# Patient Record
Sex: Male | Born: 1965
Health system: Southern US, Community
[De-identification: ages and names within clinical notes are randomized; demographics above are authoritative.]

## PROBLEM LIST (undated history)

## (undated) DIAGNOSIS — G8929 Other chronic pain: Secondary | ICD-10-CM

## (undated) DIAGNOSIS — I509 Heart failure, unspecified: Secondary | ICD-10-CM

## (undated) DIAGNOSIS — M545 Low back pain, unspecified: Secondary | ICD-10-CM

## (undated) DIAGNOSIS — J42 Unspecified chronic bronchitis: Secondary | ICD-10-CM

## (undated) DIAGNOSIS — I10 Essential (primary) hypertension: Secondary | ICD-10-CM

## (undated) DIAGNOSIS — Z720 Tobacco use: Secondary | ICD-10-CM

## (undated) DIAGNOSIS — F410 Panic disorder [episodic paroxysmal anxiety] without agoraphobia: Secondary | ICD-10-CM

## (undated) DIAGNOSIS — J449 Chronic obstructive pulmonary disease, unspecified: Secondary | ICD-10-CM

## (undated) DIAGNOSIS — M4640 Discitis, unspecified, site unspecified: Secondary | ICD-10-CM

## (undated) HISTORY — PX: SHOULDER ARTHROSCOPY W/ ROTATOR CUFF REPAIR: SHX2400

## (undated) HISTORY — PX: INCISION AND DRAINAGE FOOT: SHX1800

## (undated) HISTORY — PX: FRACTURE SURGERY: SHX138

---

## 1998-01-21 ENCOUNTER — Emergency Department (HOSPITAL_COMMUNITY): Admission: EM | Admit: 1998-01-21 | Discharge: 1998-01-21 | Payer: Self-pay | Admitting: Emergency Medicine

## 1998-09-12 ENCOUNTER — Emergency Department (HOSPITAL_COMMUNITY): Admission: EM | Admit: 1998-09-12 | Discharge: 1998-09-12 | Payer: Self-pay | Admitting: *Deleted

## 1998-12-14 ENCOUNTER — Encounter: Payer: Self-pay | Admitting: Emergency Medicine

## 1998-12-14 ENCOUNTER — Emergency Department (HOSPITAL_COMMUNITY): Admission: EM | Admit: 1998-12-14 | Discharge: 1998-12-14 | Payer: Self-pay | Admitting: Emergency Medicine

## 1999-03-17 ENCOUNTER — Encounter: Payer: Self-pay | Admitting: Emergency Medicine

## 1999-03-17 ENCOUNTER — Emergency Department (HOSPITAL_COMMUNITY): Admission: EM | Admit: 1999-03-17 | Discharge: 1999-03-17 | Payer: Self-pay | Admitting: Emergency Medicine

## 1999-03-20 ENCOUNTER — Emergency Department (HOSPITAL_COMMUNITY): Admission: EM | Admit: 1999-03-20 | Discharge: 1999-03-20 | Payer: Self-pay | Admitting: Emergency Medicine

## 1999-03-28 ENCOUNTER — Emergency Department (HOSPITAL_COMMUNITY): Admission: EM | Admit: 1999-03-28 | Discharge: 1999-03-28 | Payer: Self-pay | Admitting: Emergency Medicine

## 1999-04-24 ENCOUNTER — Encounter: Payer: Self-pay | Admitting: Emergency Medicine

## 1999-04-24 ENCOUNTER — Emergency Department (HOSPITAL_COMMUNITY): Admission: EM | Admit: 1999-04-24 | Discharge: 1999-04-24 | Payer: Self-pay | Admitting: Emergency Medicine

## 1999-06-09 ENCOUNTER — Ambulatory Visit (HOSPITAL_BASED_OUTPATIENT_CLINIC_OR_DEPARTMENT_OTHER): Admission: RE | Admit: 1999-06-09 | Discharge: 1999-06-09 | Payer: Self-pay | Admitting: Orthopedic Surgery

## 2009-03-07 ENCOUNTER — Emergency Department (HOSPITAL_COMMUNITY): Admission: EM | Admit: 2009-03-07 | Discharge: 2009-03-07 | Payer: Self-pay | Admitting: Emergency Medicine

## 2009-05-21 HISTORY — PX: ORIF METACARPAL FRACTURE: SUR940

## 2009-07-19 ENCOUNTER — Emergency Department (HOSPITAL_COMMUNITY): Admission: EM | Admit: 2009-07-19 | Discharge: 2009-07-19 | Payer: Self-pay | Admitting: Emergency Medicine

## 2009-07-20 ENCOUNTER — Emergency Department (HOSPITAL_COMMUNITY): Admission: EM | Admit: 2009-07-20 | Discharge: 2009-07-20 | Payer: Self-pay | Admitting: Psychiatry

## 2009-07-22 ENCOUNTER — Ambulatory Visit (HOSPITAL_COMMUNITY): Admission: RE | Admit: 2009-07-22 | Discharge: 2009-07-22 | Payer: Self-pay | Admitting: Plastic Surgery

## 2009-07-24 ENCOUNTER — Inpatient Hospital Stay (HOSPITAL_COMMUNITY): Admission: EM | Admit: 2009-07-24 | Discharge: 2009-07-24 | Payer: Self-pay | Admitting: Emergency Medicine

## 2009-07-26 ENCOUNTER — Emergency Department (HOSPITAL_COMMUNITY): Admission: EM | Admit: 2009-07-26 | Discharge: 2009-07-26 | Payer: Self-pay | Admitting: Emergency Medicine

## 2009-07-28 ENCOUNTER — Emergency Department (HOSPITAL_COMMUNITY): Admission: EM | Admit: 2009-07-28 | Discharge: 2009-07-28 | Payer: Self-pay | Admitting: Emergency Medicine

## 2009-08-03 ENCOUNTER — Emergency Department (HOSPITAL_COMMUNITY): Admission: EM | Admit: 2009-08-03 | Discharge: 2009-08-03 | Payer: Self-pay | Admitting: Emergency Medicine

## 2009-08-09 ENCOUNTER — Ambulatory Visit (HOSPITAL_COMMUNITY): Admission: RE | Admit: 2009-08-09 | Discharge: 2009-08-09 | Payer: Self-pay | Admitting: Plastic Surgery

## 2009-09-22 ENCOUNTER — Ambulatory Visit: Payer: Self-pay | Admitting: Family Medicine

## 2010-05-03 ENCOUNTER — Emergency Department (HOSPITAL_COMMUNITY)
Admission: EM | Admit: 2010-05-03 | Discharge: 2010-05-03 | Payer: Self-pay | Source: Home / Self Care | Admitting: Emergency Medicine

## 2010-07-11 ENCOUNTER — Emergency Department (HOSPITAL_COMMUNITY)
Admission: EM | Admit: 2010-07-11 | Discharge: 2010-07-11 | Disposition: A | Discharge status: Home / Self Care | Payer: Self-pay | Attending: Emergency Medicine | Admitting: Emergency Medicine

## 2010-07-11 DIAGNOSIS — M545 Low back pain, unspecified: Secondary | ICD-10-CM | POA: Insufficient documentation

## 2010-07-11 DIAGNOSIS — IMO0001 Reserved for inherently not codable concepts without codable children: Secondary | ICD-10-CM | POA: Insufficient documentation

## 2010-07-11 DIAGNOSIS — Y929 Unspecified place or not applicable: Secondary | ICD-10-CM | POA: Insufficient documentation

## 2010-07-11 DIAGNOSIS — X500XXA Overexertion from strenuous movement or load, initial encounter: Secondary | ICD-10-CM | POA: Insufficient documentation

## 2010-07-11 DIAGNOSIS — IMO0002 Reserved for concepts with insufficient information to code with codable children: Secondary | ICD-10-CM | POA: Insufficient documentation

## 2010-07-11 DIAGNOSIS — M79609 Pain in unspecified limb: Secondary | ICD-10-CM | POA: Insufficient documentation

## 2010-08-01 LAB — URINALYSIS, ROUTINE W REFLEX MICROSCOPIC
Bilirubin Urine: NEGATIVE
Glucose, UA: NEGATIVE mg/dL
Hgb urine dipstick: NEGATIVE
Specific Gravity, Urine: 1.018 (ref 1.005–1.030)
pH: 7.5 (ref 5.0–8.0)

## 2010-08-14 LAB — URINALYSIS, ROUTINE W REFLEX MICROSCOPIC
Glucose, UA: NEGATIVE mg/dL
Ketones, ur: NEGATIVE mg/dL
Urobilinogen, UA: 0.2 mg/dL (ref 0.0–1.0)

## 2010-08-14 LAB — POCT I-STAT, CHEM 8
Calcium, Ion: 1.08 mmol/L — ABNORMAL LOW (ref 1.12–1.32)
Chloride: 102 mEq/L (ref 96–112)
Creatinine, Ser: 0.8 mg/dL (ref 0.4–1.5)
Glucose, Bld: 126 mg/dL — ABNORMAL HIGH (ref 70–99)
HCT: 47 % (ref 39.0–52.0)
Hemoglobin: 12.9 g/dL — ABNORMAL LOW (ref 13.0–17.0)
Hemoglobin: 16 g/dL (ref 13.0–17.0)
Potassium: 3.7 mEq/L (ref 3.5–5.1)
TCO2: 23 mmol/L (ref 0–100)

## 2010-08-14 LAB — DIFFERENTIAL
Basophils Absolute: 0 10*3/uL (ref 0.0–0.1)
Basophils Relative: 0 % (ref 0–1)
Eosinophils Relative: 3 % (ref 0–5)
Monocytes Absolute: 0.6 10*3/uL (ref 0.1–1.0)
Neutro Abs: 4.5 10*3/uL (ref 1.7–7.7)

## 2010-08-14 LAB — CBC
HCT: 37.6 % — ABNORMAL LOW (ref 39.0–52.0)
Hemoglobin: 13 g/dL (ref 13.0–17.0)
Hemoglobin: 15.4 g/dL (ref 13.0–17.0)
MCHC: 34.4 g/dL (ref 30.0–36.0)
MCHC: 34.7 g/dL (ref 30.0–36.0)
MCV: 105.3 fL — ABNORMAL HIGH (ref 78.0–100.0)
RBC: 3.57 MIL/uL — ABNORMAL LOW (ref 4.22–5.81)
RDW: 14.9 % (ref 11.5–15.5)

## 2010-08-14 LAB — URINE MICROSCOPIC-ADD ON

## 2010-08-24 LAB — POCT CARDIAC MARKERS
CKMB, poc: 2.4 ng/mL (ref 1.0–8.0)
Myoglobin, poc: 97.5 ng/mL (ref 12–200)

## 2010-10-05 ENCOUNTER — Emergency Department (HOSPITAL_COMMUNITY)
Admission: EM | Admit: 2010-10-05 | Discharge: 2010-10-09 | Disposition: A | Discharge status: Other Acute Inpatient Hospital | Payer: Self-pay | Attending: Emergency Medicine | Admitting: Emergency Medicine

## 2010-10-05 DIAGNOSIS — F1910 Other psychoactive substance abuse, uncomplicated: Secondary | ICD-10-CM | POA: Insufficient documentation

## 2010-10-05 DIAGNOSIS — F329 Major depressive disorder, single episode, unspecified: Secondary | ICD-10-CM | POA: Insufficient documentation

## 2010-10-05 DIAGNOSIS — R079 Chest pain, unspecified: Secondary | ICD-10-CM | POA: Insufficient documentation

## 2010-10-05 DIAGNOSIS — F3289 Other specified depressive episodes: Secondary | ICD-10-CM | POA: Insufficient documentation

## 2010-10-05 DIAGNOSIS — R45851 Suicidal ideations: Secondary | ICD-10-CM | POA: Insufficient documentation

## 2010-10-05 LAB — COMPREHENSIVE METABOLIC PANEL WITH GFR
ALT: 124 U/L — ABNORMAL HIGH (ref 0–53)
AST: 66 U/L — ABNORMAL HIGH (ref 0–37)
Albumin: 3.6 g/dL (ref 3.5–5.2)
Alkaline Phosphatase: 75 U/L (ref 39–117)
BUN: 15 mg/dL (ref 6–23)
CO2: 26 meq/L (ref 19–32)
Calcium: 9.5 mg/dL (ref 8.4–10.5)
Chloride: 103 meq/L (ref 96–112)
Creatinine, Ser: 1.05 mg/dL (ref 0.4–1.5)
GFR calc non Af Amer: >60 mL/min
Glucose, Bld: 114 mg/dL — ABNORMAL HIGH (ref 70–99)
Potassium: 3.8 meq/L (ref 3.5–5.1)
Sodium: 137 meq/L (ref 135–145)
Total Bilirubin: 0.5 mg/dL (ref 0.3–1.2)
Total Protein: 7.4 g/dL (ref 6.0–8.3)

## 2010-10-05 LAB — CBC
HCT: 42.8 % (ref 39.0–52.0)
Hemoglobin: 15 g/dL (ref 13.0–17.0)
MCH: 35.2 pg — ABNORMAL HIGH (ref 26.0–34.0)
MCHC: 35 g/dL (ref 30.0–36.0)
MCV: 100.5 fL — ABNORMAL HIGH (ref 78.0–100.0)
Platelets: 204 K/uL (ref 150–400)
RBC: 4.26 MIL/uL (ref 4.22–5.81)
RDW: 13.7 % (ref 11.5–15.5)
WBC: 6.6 K/uL (ref 4.0–10.5)

## 2010-10-05 LAB — POCT CARDIAC MARKERS
CKMB, poc: 1.5 ng/mL (ref 1.0–8.0)
Myoglobin, poc: 51.2 ng/mL (ref 12–200)

## 2010-10-05 LAB — ETHANOL

## 2010-10-05 LAB — SALICYLATE LEVEL: Salicylate Lvl: <2 mg/dL — ABNORMAL LOW (ref 2.8–20.0)

## 2010-10-05 LAB — DIFFERENTIAL
Basophils Relative: 0 % (ref 0–1)
Eosinophils Absolute: 0.2 10*3/uL (ref 0.0–0.7)
Eosinophils Relative: 3 % (ref 0–5)

## 2010-10-05 LAB — RAPID URINE DRUG SCREEN, HOSP PERFORMED
Amphetamines: NOT DETECTED
Barbiturates: NOT DETECTED
Benzodiazepines: POSITIVE — AB
Cocaine: POSITIVE — AB
Opiates: NOT DETECTED
Tetrahydrocannabinol: NOT DETECTED

## 2010-10-05 LAB — ACETAMINOPHEN LEVEL: Acetaminophen (Tylenol), Serum: <15 ug/mL (ref 10–30)

## 2010-10-07 DIAGNOSIS — F329 Major depressive disorder, single episode, unspecified: Secondary | ICD-10-CM

## 2010-10-07 DIAGNOSIS — F1910 Other psychoactive substance abuse, uncomplicated: Secondary | ICD-10-CM

## 2010-10-08 DIAGNOSIS — F329 Major depressive disorder, single episode, unspecified: Secondary | ICD-10-CM

## 2010-10-09 DIAGNOSIS — F4310 Post-traumatic stress disorder, unspecified: Secondary | ICD-10-CM

## 2010-10-16 ENCOUNTER — Emergency Department (HOSPITAL_COMMUNITY): Payer: Self-pay

## 2010-10-16 ENCOUNTER — Emergency Department (HOSPITAL_COMMUNITY)
Admission: EM | Admit: 2010-10-16 | Discharge: 2010-10-16 | Disposition: A | Discharge status: Home / Self Care | Payer: Self-pay | Attending: Emergency Medicine | Admitting: Emergency Medicine

## 2010-10-16 DIAGNOSIS — IMO0002 Reserved for concepts with insufficient information to code with codable children: Secondary | ICD-10-CM | POA: Insufficient documentation

## 2010-10-16 DIAGNOSIS — Y929 Unspecified place or not applicable: Secondary | ICD-10-CM | POA: Insufficient documentation

## 2010-10-16 DIAGNOSIS — T07XXXA Unspecified multiple injuries, initial encounter: Secondary | ICD-10-CM | POA: Insufficient documentation

## 2010-10-16 DIAGNOSIS — R05 Cough: Secondary | ICD-10-CM | POA: Insufficient documentation

## 2010-10-16 DIAGNOSIS — R51 Headache: Secondary | ICD-10-CM | POA: Insufficient documentation

## 2010-10-16 DIAGNOSIS — Z79899 Other long term (current) drug therapy: Secondary | ICD-10-CM | POA: Insufficient documentation

## 2010-10-16 DIAGNOSIS — R059 Cough, unspecified: Secondary | ICD-10-CM | POA: Insufficient documentation

## 2010-10-16 DIAGNOSIS — S2249XA Multiple fractures of ribs, unspecified side, initial encounter for closed fracture: Secondary | ICD-10-CM | POA: Insufficient documentation

## 2010-10-16 DIAGNOSIS — M25529 Pain in unspecified elbow: Secondary | ICD-10-CM | POA: Insufficient documentation

## 2010-10-16 DIAGNOSIS — S060X9A Concussion with loss of consciousness of unspecified duration, initial encounter: Secondary | ICD-10-CM | POA: Insufficient documentation

## 2010-10-16 DIAGNOSIS — M25569 Pain in unspecified knee: Secondary | ICD-10-CM | POA: Insufficient documentation

## 2010-10-16 DIAGNOSIS — R079 Chest pain, unspecified: Secondary | ICD-10-CM | POA: Insufficient documentation

## 2010-10-16 LAB — URINE MICROSCOPIC-ADD ON

## 2010-10-16 LAB — SAMPLE TO BLOOD BANK

## 2010-10-16 LAB — POCT I-STAT, CHEM 8
Chloride: 101 mEq/L (ref 96–112)
Glucose, Bld: 113 mg/dL — ABNORMAL HIGH (ref 70–99)
HCT: 46 % (ref 39.0–52.0)
Hemoglobin: 15.6 g/dL (ref 13.0–17.0)
Potassium: 3.8 mEq/L (ref 3.5–5.1)
Sodium: 137 mEq/L (ref 135–145)

## 2010-10-16 LAB — URINALYSIS, ROUTINE W REFLEX MICROSCOPIC
Glucose, UA: NEGATIVE mg/dL
Ketones, ur: NEGATIVE mg/dL
Leukocytes, UA: NEGATIVE
Nitrite: NEGATIVE
Protein, ur: NEGATIVE mg/dL
Urobilinogen, UA: 1 mg/dL (ref 0.0–1.0)

## 2010-10-16 MED ORDER — IOHEXOL 300 MG/ML  SOLN
100.0000 mL | Freq: Once | INTRAMUSCULAR | Status: AC | PRN
  Administered 2010-10-16: 100 mL via INTRAVENOUS

## 2010-10-21 ENCOUNTER — Emergency Department (HOSPITAL_COMMUNITY): Payer: Self-pay

## 2010-10-21 ENCOUNTER — Emergency Department (HOSPITAL_COMMUNITY)
Admission: EM | Admit: 2010-10-21 | Discharge: 2010-10-22 | Disposition: A | Discharge status: Home / Self Care | Payer: No Typology Code available for payment source | Attending: Emergency Medicine | Admitting: Emergency Medicine

## 2010-10-21 DIAGNOSIS — IMO0002 Reserved for concepts with insufficient information to code with codable children: Secondary | ICD-10-CM | POA: Insufficient documentation

## 2010-10-21 DIAGNOSIS — F1410 Cocaine abuse, uncomplicated: Secondary | ICD-10-CM | POA: Insufficient documentation

## 2010-10-21 DIAGNOSIS — Y998 Other external cause status: Secondary | ICD-10-CM | POA: Insufficient documentation

## 2010-10-21 DIAGNOSIS — M25569 Pain in unspecified knee: Secondary | ICD-10-CM | POA: Insufficient documentation

## 2010-10-21 DIAGNOSIS — Y92410 Unspecified street and highway as the place of occurrence of the external cause: Secondary | ICD-10-CM | POA: Insufficient documentation

## 2010-10-21 DIAGNOSIS — S20219A Contusion of unspecified front wall of thorax, initial encounter: Secondary | ICD-10-CM | POA: Insufficient documentation

## 2010-10-21 DIAGNOSIS — F411 Generalized anxiety disorder: Secondary | ICD-10-CM | POA: Insufficient documentation

## 2011-03-22 ENCOUNTER — Emergency Department (HOSPITAL_COMMUNITY)
Admission: EM | Admit: 2011-03-22 | Discharge: 2011-03-22 | Discharge status: Against Medical Advice | Payer: Self-pay | Attending: Emergency Medicine | Admitting: Emergency Medicine

## 2011-11-12 ENCOUNTER — Encounter (HOSPITAL_COMMUNITY): Payer: Self-pay | Admitting: Emergency Medicine

## 2011-11-12 ENCOUNTER — Emergency Department (HOSPITAL_COMMUNITY)
Admission: EM | Admit: 2011-11-12 | Discharge: 2011-11-13 | Disposition: A | Discharge status: Other Acute Inpatient Hospital | Payer: Self-pay | Attending: Emergency Medicine | Admitting: Emergency Medicine

## 2011-11-12 DIAGNOSIS — Z5900 Homelessness unspecified: Secondary | ICD-10-CM | POA: Insufficient documentation

## 2011-11-12 DIAGNOSIS — F1110 Opioid abuse, uncomplicated: Secondary | ICD-10-CM

## 2011-11-12 DIAGNOSIS — F17200 Nicotine dependence, unspecified, uncomplicated: Secondary | ICD-10-CM | POA: Insufficient documentation

## 2011-11-12 DIAGNOSIS — F1010 Alcohol abuse, uncomplicated: Secondary | ICD-10-CM | POA: Insufficient documentation

## 2011-11-12 DIAGNOSIS — F1410 Cocaine abuse, uncomplicated: Secondary | ICD-10-CM | POA: Insufficient documentation

## 2011-11-12 HISTORY — DX: Panic disorder (episodic paroxysmal anxiety): F41.0

## 2011-11-12 LAB — RAPID URINE DRUG SCREEN, HOSP PERFORMED
Amphetamines: NOT DETECTED
Benzodiazepines: NOT DETECTED
Opiates: POSITIVE — AB

## 2011-11-12 LAB — CBC
HCT: 42.4 % (ref 39.0–52.0)
Hemoglobin: 15.3 g/dL (ref 13.0–17.0)
MCH: 36.1 pg — ABNORMAL HIGH (ref 26.0–34.0)
MCHC: 36.1 g/dL — ABNORMAL HIGH (ref 30.0–36.0)
MCV: 100 fL (ref 78.0–100.0)
RDW: 13.6 % (ref 11.5–15.5)

## 2011-11-12 LAB — COMPREHENSIVE METABOLIC PANEL
Albumin: 3.8 g/dL (ref 3.5–5.2)
Alkaline Phosphatase: 64 U/L (ref 39–117)
BUN: 9 mg/dL (ref 6–23)
Creatinine, Ser: 1.15 mg/dL (ref 0.50–1.35)
GFR calc Af Amer: 86 mL/min — ABNORMAL LOW (ref >90)
Glucose, Bld: 132 mg/dL — ABNORMAL HIGH (ref 70–99)
Potassium: 3.4 mEq/L — ABNORMAL LOW (ref 3.5–5.1)
Total Protein: 7.7 g/dL (ref 6.0–8.3)

## 2011-11-12 LAB — ETHANOL: Alcohol, Ethyl (B): 70 mg/dL — ABNORMAL HIGH (ref 0–11)

## 2011-11-12 MED ORDER — ACETAMINOPHEN 325 MG PO TABS: 650.0000 mg | ORAL_TABLET | ORAL | Status: DC | PRN

## 2011-11-12 MED ORDER — ONDANSETRON HCL 4 MG PO TABS: 4.0000 mg | ORAL_TABLET | Freq: Three times a day (TID) | ORAL | Status: DC | PRN

## 2011-11-12 MED ORDER — ZOLPIDEM TARTRATE 5 MG PO TABS: 5.0000 mg | ORAL_TABLET | Freq: Every evening | ORAL | Status: DC | PRN

## 2011-11-12 MED ORDER — NICOTINE 21 MG/24HR TD PT24: 21.0000 mg | MEDICATED_PATCH | Freq: Every day | TRANSDERMAL | Status: DC | PRN

## 2011-11-12 MED ORDER — IBUPROFEN 600 MG PO TABS: 600.0000 mg | ORAL_TABLET | Freq: Three times a day (TID) | ORAL | Status: DC | PRN

## 2011-11-12 NOTE — ED Notes (Signed)
Pt states he is here for detox from cocaine and alcohol  Pt states last used cocaine yesterday and last drank today  Pt states he usually drinks daily  Pt states the cocaine is more of a problem for him then drinking

## 2011-11-12 NOTE — ED Notes (Signed)
Pt reports recently increase in drinking for the past week in context of stressors at home, unemployment and sick father. Pt denies SI/HI/AVH, states he has had detox tx "years ago". Denies psych hx/admissions

## 2011-11-12 NOTE — ED Provider Notes (Signed)
History     CSN: [id number]  Arrival date & time 11/12/11  1740   First MD Initiated Contact with Patient 11/12/11 2112      Chief Complaint  Patient presents with  . Medical Clearance    (Consider location/radiation/quality/duration/timing/severity/associated sxs/prior treatment) HPI Comments: [name] is a 46 y.o. Male who states that he needs detoxification from alcohol and cocaine. He also uses narcotics, and recently. He has been homeless for 2 weeks because his mother would not and live in her home. He was detoxified 4 years ago in an observed setting. He denies weakness, dizziness, nausea, vomiting, shortness of breath, or chest pain.  The history is provided by the patient.    Past Medical History  Diagnosis Date  . Panic attacks     Past Surgical History  Procedure Date  . Shoulder surgery   . Hand surgery     Family History  Problem Relation Age of Onset  . Cancer Other   . Stroke Other   . Coronary artery disease Other     History  Substance Use Topics  . Smoking status: Current Everyday Smoker    Types: Cigarettes  . Smokeless tobacco: Not on file  . Alcohol Use: Yes     heavy      Review of Systems  All other systems reviewed and are negative.    Allergies  Penicillins  Home Medications  No current outpatient prescriptions on file.  BP 150/93  Pulse 72  Temp 97.9 F (36.6 C) (Oral)  Resp 20  SpO2 98%  Physical Exam  Nursing note and vitals reviewed. Constitutional: He is oriented to person, place, and time. He appears well-developed and well-nourished.  HENT:  Head: Normocephalic and atraumatic.  Right Ear: External ear normal.  Left Ear: External ear normal.  Eyes: Conjunctivae and EOM are normal. Pupils are equal, round, and reactive to light.  Neck: Normal range of motion and phonation normal. Neck supple.  Cardiovascular: Normal rate, regular rhythm, normal heart sounds and intact distal pulses.   Pulmonary/Chest:  Effort normal and breath sounds normal. He exhibits no bony tenderness.  Abdominal: Soft. Normal appearance. There is no tenderness.  Musculoskeletal: Normal range of motion.  Neurological: He is alert and oriented to person, place, and time. He has normal strength. No cranial nerve deficit or sensory deficit. He exhibits normal muscle tone. Coordination normal.  Skin: Skin is warm, dry and intact.  Psychiatric: He has a normal mood and affect. His behavior is normal. Judgment and thought content normal.    ED Course  Procedures (including critical care time)  Labs Reviewed  CBC - Abnormal; Notable for the following:    MCH 36.1 (*)     MCHC 36.1 (*)     All other components within normal limits  COMPREHENSIVE METABOLIC PANEL - Abnormal; Notable for the following:    Potassium 3.4 (*)     Glucose, Bld 132 (*)     AST 69 (*)     ALT 105 (*)     GFR calc non Af Amer 74 (*)     GFR calc Af Amer 86 (*)     All other components within normal limits  ETHANOL - Abnormal; Notable for the following:    Alcohol, Ethyl (B) 70 (*)     All other components within normal limits  URINE RAPID DRUG SCREEN (HOSP PERFORMED) - Abnormal; Notable for the following:    Opiates POSITIVE (*)     Cocaine  POSITIVE (*)     All other components within normal limits   No results found.   1. Alcohol abuse   2. Cocaine abuse   3. Narcotic abuse       MDM  Patient has a low Alcohol level, without signs of alcohol w/d syndrome. He is medically cleared for evaluation and treatment in a psychiatric setting.        Flint Melter, MD 11/13/11 [phone]

## 2011-11-12 NOTE — ED Notes (Signed)
NWG:[id number]<HY> Expected date:<BR> Expected time:<BR> Means of arrival:<BR> Comments:<BR> hold

## 2011-11-13 MED ORDER — LORAZEPAM 2 MG/ML IJ SOLN: 1.0000 mg | Freq: Four times a day (QID) | INTRAMUSCULAR | Status: DC | PRN

## 2011-11-13 MED ORDER — VITAMIN B-1 100 MG PO TABS
100.0000 mg | ORAL_TABLET | Freq: Every day | ORAL | Status: DC
  Administered 2011-11-13: 100 mg via ORAL
  Filled 2011-11-13: qty 1

## 2011-11-13 MED ORDER — THIAMINE HCL 100 MG/ML IJ SOLN: 100.0000 mg | Freq: Every day | INTRAMUSCULAR | Status: DC

## 2011-11-13 MED ORDER — LORAZEPAM 1 MG PO TABS: 0.0000 mg | ORAL_TABLET | Freq: Two times a day (BID) | ORAL | Status: DC

## 2011-11-13 MED ORDER — FOLIC ACID 1 MG PO TABS
1.0000 mg | ORAL_TABLET | Freq: Every day | ORAL | Status: DC
  Administered 2011-11-13: 1 mg via ORAL
  Filled 2011-11-13: qty 1

## 2011-11-13 MED ORDER — LORAZEPAM 1 MG PO TABS
1.0000 mg | ORAL_TABLET | Freq: Four times a day (QID) | ORAL | Status: DC | PRN
  Filled 2011-11-13: qty 1

## 2011-11-13 MED ORDER — ADULT MULTIVITAMIN W/MINERALS CH
1.0000 | ORAL_TABLET | Freq: Every day | ORAL | Status: DC
  Administered 2011-11-13: 1 via ORAL
  Filled 2011-11-13: qty 1

## 2011-11-13 MED ORDER — LORAZEPAM 1 MG PO TABS
0.0000 mg | ORAL_TABLET | Freq: Four times a day (QID) | ORAL | Status: DC
  Administered 2011-11-13: 1 mg via ORAL

## 2011-11-13 NOTE — ED Provider Notes (Signed)
Pt here requesting detox.  Resting this am.    Filed Vitals:   11/13/11 0619  BP: 154/95  Pulse: 55  Temp:   Resp:     Car: RRR  Lungs CTA  , snoring,  Celene Kras, MD 11/13/11 [phone]

## 2011-11-13 NOTE — BH Assessment (Signed)
Assessment Note   [name] is a 46 y.o. male who presents to [facility] for detox from alcohol and cocaine.  Pt denies SI/HI/Psych.  Pt reports drinking 12-12.5 pack alcohol daily and 2 grams of cocaine daily, last use was approx 2 days for cocaine and 1 day ago for alcohol.  Pt c/o sweats, no addt'l w/d sxs, reports to seizures associated with alcohol use, last seizure was 2 weeks ago, no issues with blackouts.  Pt says he wants help with SA b/c he tired of living this way.  Pt has previous detox experience with [facility] approx 3 yrs ago, no inpt admissions.  Pt has criminal chgs(driving while licensed is revoked) and court date 11/2011.  Pt was living with parents and they have asked him to leave their home due to SA.  Pt is homeless.    Axis I: Alcohol Dep; Cocaine Dep Axis II: Deferred Axis III:  Past Medical History  Diagnosis Date  . Panic attacks    Axis IV: economic problems, housing problems, other psychosocial or environmental problems, problems related to legal system/crime, problems related to social environment and problems with primary support group Axis V: 41-50 serious symptoms  Past Medical History:  Past Medical History  Diagnosis Date  . Panic attacks     Past Surgical History  Procedure Date  . Shoulder surgery   . Hand surgery     Family History:  Family History  Problem Relation Age of Onset  . Cancer Other   . Stroke Other   . Coronary artery disease Other     Social History:  reports that he has been smoking Cigarettes.  He does not have any smokeless tobacco history on file. He reports that he drinks alcohol. He reports that he uses illicit drugs (Cocaine).  Additional Social History:  Alcohol / Drug Use Pain Medications: None  Prescriptions: None  Over the Counter: None  History of alcohol / drug use?: Yes Longest period of sobriety (when/how long): None  Withdrawal Symptoms: Sweats;Seizures Substance #1 Name of Substance 1: Alcohol  1 - Age of  First Use: 16 YOM  1 - Amount (size/oz): 12-12.5 pk  1 - Frequency: Daily  1 - Duration: On-going  1 - Last Use / Amount: 11/12/11 Substance #2 Name of Substance 2: Cocaine  2 - Age of First Use: 30's  2 - Amount (size/oz): 2 Grams  2 - Frequency: Daily  2 - Duration: On-going  2 - Last Use / Amount: 2 Days Ago   CIWA: CIWA-Ar BP: 146/86 mmHg Pulse Rate: 72  Nausea and Vomiting: no nausea and no vomiting Tactile Disturbances: none Tremor: no tremor Auditory Disturbances: not present Paroxysmal Sweats: two Visual Disturbances: not present Anxiety: no anxiety, at ease Headache, Fullness in Head: none present Agitation: normal activity Orientation and Clouding of Sensorium: oriented and can do serial additions CIWA-Ar Total: 2  COWS: Clinical Opiate Withdrawal Scale (COWS) Resting Pulse Rate: Pulse Rate 80 or below Sweating: No report of chills or flushing Restlessness: Able to sit still Pupil Size: Pupils pinned or normal size for room light Bone or Joint Aches: Not present Runny Nose or Tearing: Not present GI Upset: No GI symptoms Tremor: No tremor Yawning: No yawning Anxiety or Irritability: None Gooseflesh Skin: Skin is smooth COWS Total Score: 0   Allergies:  Allergies  Allergen Reactions  . Penicillins Other (See Comments)    unknown    Home Medications:  (Not in a hospital admission)  OB/GYN Status:  No LMP for male patient.  General Assessment Data Location of Assessment: [facility] ED Living Arrangements: Other (Comment) (Homeless ) Can pt return to current living arrangement?: Yes Admission Status: Voluntary Is patient capable of signing voluntary admission?: Yes Transfer from: Acute Hospital Referral Source: MD  Education Status Is patient currently in school?: No Current Grade: None  Highest grade of school patient has completed: None  Name of school: None  Contact person: None   Risk to self Suicidal Ideation: No Suicidal Intent: No Is  patient at risk for suicide?: No Suicidal Plan?: No Access to Means: No What has been your use of drugs/alcohol within the last 12 months?: Abusing Cocaine, Alcohol  Previous Attempts/Gestures: No How many times?: 0  Other Self Harm Risks: None  Triggers for Past Attempts: None known Intentional Self Injurious Behavior: None Family Suicide History: No Recent stressful life event(s): Other (Comment) (Homeless ) Persecutory voices/beliefs?: No Depression: Yes Depression Symptoms: Loss of interest in usual pleasures Substance abuse history and/or treatment for substance abuse?: Yes Suicide prevention information given to non-admitted patients: Not applicable  Risk to Others Homicidal Ideation: No Thoughts of Harm to Others: No Current Homicidal Intent: No Current Homicidal Plan: No Access to Homicidal Means: No Identified Victim: None  History of harm to others?: No Assessment of Violence: None Noted Violent Behavior Description: None  Does patient have access to weapons?: No Criminal Charges Pending?: Yes Describe Pending Criminal Charges: Driving while License is revoked  Does patient have a court date: Yes Court Date: 11/19/11  Psychosis Hallucinations: None noted Delusions: None noted  Mental Status Report Appear/Hygiene: Poor hygiene;Disheveled Eye Contact: Fair Motor Activity: Unremarkable Speech: Logical/coherent Level of Consciousness: Alert Mood: Depressed Affect: Depressed Anxiety Level: None Thought Processes: Coherent;Relevant Judgement: Unimpaired Orientation: Person;Place;Time;Situation Obsessive Compulsive Thoughts/Behaviors: None  Cognitive Functioning Concentration: Normal Memory: Recent Intact;Remote Intact IQ: Average Insight: Fair Impulse Control: Fair Appetite: Fair Weight Loss: 0  Weight Gain: 0  Sleep: No Change Total Hours of Sleep: 6  Vegetative Symptoms: None  ADLScreening [facility] Assessment Services) Patient's cognitive ability  adequate to safely complete daily activities?: Yes Patient able to express need for assistance with ADLs?: Yes Independently performs ADLs?: Yes  Abuse/Neglect [facility]) Physical Abuse: Denies Verbal Abuse: Denies Sexual Abuse: Denies  Prior Inpatient Therapy Prior Inpatient Therapy: Yes Prior Therapy Dates: 3-4 yrs ago  Prior Therapy Facilty/Provider(s): [facility] Reason for Treatment: Detox   Prior Outpatient Therapy Prior Outpatient Therapy: No Prior Therapy Dates: None  Prior Therapy Facilty/Provider(s): None  Reason for Treatment: None   ADL Screening (condition at time of admission) Patient's cognitive ability adequate to safely complete daily activities?: Yes Patient able to express need for assistance with ADLs?: Yes Independently performs ADLs?: Yes Weakness of Legs: None Weakness of Arms/Hands: None  Home Assistive Devices/Equipment Home Assistive Devices/Equipment: None  Therapy Consults (therapy consults require a physician order) PT Evaluation Needed: No OT Evalulation Needed: No SLP Evaluation Needed: No Abuse/Neglect Assessment (Assessment to be complete while patient is alone) Physical Abuse: Denies Verbal Abuse: Denies Sexual Abuse: Denies Exploitation of patient/patient's resources: Denies Self-Neglect: Denies Values / Beliefs Cultural Requests During Hospitalization: None Spiritual Requests During Hospitalization: None Consults Spiritual Care Consult Needed: No Social Work Consult Needed: No Merchant [employer] officer (For Healthcare) Advance Directive: Patient does not have advance directive;Patient would not like information Pre-existing out of facility DNR order (yellow form or pink MOST form): No Nutrition Screen Diet: Regular Unintentional weight loss greater than 10lbs within the last month: No Problems chewing  or swallowing foods and/or liquids: No Home Tube Feeding or Total Parenteral Nutrition (TPN): No Patient appears severely malnourished:  No  Additional Information 1:1 In Past 12 Months?: No CIRT Risk: No Elopement Risk: No Does patient have medical clearance?: Yes     Disposition:  Disposition Disposition of Patient: Inpatient treatment program ([facility]) Type of inpatient treatment program: Adult Patient referred to: [facility]  On Site Evaluation by:   Reviewed with Physician:     Murrell Redden 11/13/2011 2:27 AM

## 2014-10-14 ENCOUNTER — Encounter (HOSPITAL_COMMUNITY): Payer: Self-pay

## 2014-10-14 ENCOUNTER — Emergency Department (HOSPITAL_COMMUNITY)

## 2014-10-14 ENCOUNTER — Emergency Department (HOSPITAL_COMMUNITY)
Admission: EM | Admit: 2014-10-14 | Discharge: 2014-10-14 | Disposition: A | Discharge status: Home / Self Care | Attending: Emergency Medicine | Admitting: Emergency Medicine

## 2014-10-14 DIAGNOSIS — Z8659 Personal history of other mental and behavioral disorders: Secondary | ICD-10-CM | POA: Diagnosis not present

## 2014-10-14 DIAGNOSIS — Z87891 Personal history of nicotine dependence: Secondary | ICD-10-CM | POA: Diagnosis not present

## 2014-10-14 DIAGNOSIS — W010XXA Fall on same level from slipping, tripping and stumbling without subsequent striking against object, initial encounter: Secondary | ICD-10-CM | POA: Diagnosis not present

## 2014-10-14 DIAGNOSIS — S63501A Unspecified sprain of right wrist, initial encounter: Secondary | ICD-10-CM | POA: Diagnosis not present

## 2014-10-14 DIAGNOSIS — S6991XA Unspecified injury of right wrist, hand and finger(s), initial encounter: Secondary | ICD-10-CM | POA: Diagnosis present

## 2014-10-14 DIAGNOSIS — Z880 Allergy status to penicillin: Secondary | ICD-10-CM | POA: Diagnosis not present

## 2014-10-14 DIAGNOSIS — Y9389 Activity, other specified: Secondary | ICD-10-CM | POA: Diagnosis not present

## 2014-10-14 DIAGNOSIS — Y92140 Kitchen in prison as the place of occurrence of the external cause: Secondary | ICD-10-CM | POA: Insufficient documentation

## 2014-10-14 DIAGNOSIS — Y990 Civilian activity done for income or pay: Secondary | ICD-10-CM | POA: Diagnosis not present

## 2014-10-14 MED ORDER — IBUPROFEN 800 MG PO TABS
800.0000 mg | ORAL_TABLET | Freq: Once | ORAL | Status: AC
  Administered 2014-10-14: 800 mg via ORAL
  Filled 2014-10-14: qty 1

## 2014-10-14 MED ORDER — HYDROCODONE-ACETAMINOPHEN 5-325 MG PO TABS: 1.0000 | ORAL_TABLET | ORAL | Status: DC | PRN

## 2014-10-14 MED ORDER — IBUPROFEN 600 MG PO TABS: 600.0000 mg | ORAL_TABLET | Freq: Four times a day (QID) | ORAL | Status: DC | PRN

## 2014-10-14 MED ORDER — HYDROCODONE-ACETAMINOPHEN 5-325 MG PO TABS
1.0000 | ORAL_TABLET | Freq: Once | ORAL | Status: AC
  Administered 2014-10-14: 1 via ORAL
  Filled 2014-10-14: qty 1

## 2014-10-14 NOTE — ED Notes (Signed)
Pt reports slipped and fell approx 2 hours ago.  Reports bent r wrist backwards.

## 2014-10-14 NOTE — ED Provider Notes (Signed)
CSN: [id number]     Arrival date & time 10/14/14  1317 History   First MD Initiated Contact with Patient 10/14/14 1422     Chief Complaint  Patient presents with  . Wrist Pain     (Consider location/radiation/quality/duration/timing/severity/associated sxs/prior Treatment) The history is provided by the patient.   [name] is a 49 y.o. male presenting with acute injury to his right wrist.  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  He denies weakness or numbness distal to the injury site.  He has pain at rest and with movement.  He denies prior injury to the wrist.    Past Medical History  Diagnosis Date  . Panic attacks    Past Surgical History  Procedure Laterality Date  . Shoulder surgery    . Hand surgery     Family History  Problem Relation Age of Onset  . Cancer Other   . Stroke Other   . Coronary artery disease Other    History  Substance Use Topics  . Smoking status: Former Smoker    Types: Cigarettes  . Smokeless tobacco: Not on file  . Alcohol Use: Yes     Comment: heavy- pt denies    Review of Systems  Constitutional: Negative for fever.  Musculoskeletal: Positive for joint swelling and arthralgias. Negative for myalgias.  Neurological: Negative for weakness and numbness.      Allergies  Penicillins  Home Medications   Prior to Admission medications   Medication Sig Start Date End Date Taking? Authorizing Provider  HYDROcodone-acetaminophen (NORCO/VICODIN) 5-325 MG per tablet Take 1 tablet by mouth every 4 (four) hours as needed. 10/14/14   Evalee Jefferson, PA-C  ibuprofen (ADVIL,MOTRIN) 600 MG tablet Take 1 tablet (600 mg total) by mouth every 6 (six) hours as needed. 10/14/14   Evalee Jefferson, PA-C   BP 160/96 mmHg  Pulse 81  Temp(Src) 98.6 F (37 C) (Oral)  Resp 18  Ht 5\' 8"  (1.727 m)  Wt 200 lb (90.719 kg)  BMI 30.42 kg/m2  SpO2 99% Physical Exam   Constitutional: He appears well-developed and well-nourished.  HENT:  Head: Atraumatic.  Neck: Normal range of motion.  Cardiovascular:  Pulses equal bilaterally  Musculoskeletal: He exhibits edema and tenderness.       Hands: Patient has moderate tenderness and mild edema at his right radial wrist.  He displays full range of motion of fingers, less than 3 second distal Refill.  Radial pulses intact.  No visible deformity.  No pain in forearm or elbow.  Neurological: He is alert. He has normal strength. He displays normal reflexes. No sensory deficit.  Skin: Skin is warm and dry.  Psychiatric: He has a normal mood and affect.    ED Course  Procedures (including critical care time) Labs Review Labs Reviewed - No data to display  Imaging Review Dg Wrist Complete Right  10/14/2014   CLINICAL DATA:  Slip and fall with wrist pain, initial encounter  EXAM: RIGHT WRIST - COMPLETE 3+ VIEW  COMPARISON:  None.  FINDINGS: Degenerative changes are noted in the first [facility] joint ends first MCP joint. Ulnar minus variant is noted. No acute fracture or dislocation is seen. Mild widening of the scapholunate space is noted which may be related to ligamentous injury.  IMPRESSION: No acute fracture noted.  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   Electronically Signed  By: Inez Catalina M.D.   On: 10/14/2014 14:09     EKG Interpretation None      MDM   Final diagnoses:  Wrist sprain, right, initial encounter    Patients labs and/or radiological studies were reviewed and considered during the medical decision making and disposition process.  Results were also discussed with patient.  Patient was placed in a Velcro wrist splint, sling also provided.  Ice and elevation as much as possible for the next several days.  He was prescribed ibuprofen and hydrocodone.  He will follow-up with orthopedics which is available at his prison.  Advised that he may need an  MRI if symptoms persist beyond the next week as there is a suggestion of a possible ligament injury on his plain films.  He was given the plain films on CD to show to the medical provider at his prison.  The patient appears reasonably screened and/or stabilized for discharge and I doubt any other medical condition or other [facility] requiring further screening, evaluation, or treatment in the ED at this time prior to discharge.     Evalee Jefferson, PA-C 10/14/14 1505  Milton Ferguson, MD 10/14/14 1534

## 2014-10-14 NOTE — ED Notes (Signed)
PA Evalee Jefferson at bedside.

## 2014-10-14 NOTE — Discharge Instructions (Signed)
°  Ligament Sprain Ligaments are tough, fibrous tissues that hold bones together at the joints. A sprain can occur when a ligament is stretched. This injury may take several weeks to heal. Vanlue the injured area for as long as directed by your caregiver. Then slowly start using the joint as directed by your caregiver and as the pain allows.  Keep the affected joint raised if possible to lessen swelling.  Apply ice for 15-20 minutes to the injured area every couple hours for the first half day, then 03-04 times per day for the first 48 hours. Put the ice in a plastic bag and place a towel between the bag of ice and your skin.  Wear any splinting, casting, or elastic bandage applications as instructed.  Only take over-the-counter or prescription medicines for pain, discomfort, or fever as directed by your caregiver. Do not use aspirin immediately after the injury unless instructed by your caregiver. Aspirin can cause increased bleeding and bruising of the tissues.  If you were given crutches, continue to use them as instructed and do not resume weight bearing on the affected extremity until instructed. SEEK MEDICAL CARE IF:   Your bruising, swelling, or pain increases.  You have cold and numb fingers or toes if your arm or leg was injured. SEEK IMMEDIATE MEDICAL CARE IF:   Your toes are numb or blue if your leg was injured.  Your fingers are numb or blue if your arm was injured.  Your pain is not responding to medicines and continues to stay the same or gets worse. MAKE SURE YOU:   Understand these instructions.  Will watch your condition.  Will get help right away if you are not doing well or get worse. Document Released: 05/04/2000 Document Revised: 07/30/2011 Document Reviewed: 03/02/2008 [employer] Patient Information 2015 [place], [place]. This information is not intended to replace advice given to you by your health care provider. Make sure you discuss any  questions you have with your health care provider.   Use the wrist splint and sling for comfort.  Apply ice to wrist as much as possible over the next 2 days to help with pain and swelling.  You will need a recheck of your injury within 4-5 days.  As discussed you may need an MRI of the wrist to determine if you do have a ligament injury in your wrist.  This should be done if you continue to have pain and swelling beyond the next week.

## 2015-01-12 ENCOUNTER — Encounter (HOSPITAL_COMMUNITY): Payer: Self-pay | Admitting: Emergency Medicine

## 2015-01-12 ENCOUNTER — Emergency Department (HOSPITAL_COMMUNITY)

## 2015-01-12 ENCOUNTER — Emergency Department (HOSPITAL_COMMUNITY)
Admission: EM | Admit: 2015-01-12 | Discharge: 2015-01-12 | Disposition: A | Discharge status: Home / Self Care | Attending: Emergency Medicine | Admitting: Emergency Medicine

## 2015-01-12 DIAGNOSIS — R0602 Shortness of breath: Secondary | ICD-10-CM | POA: Insufficient documentation

## 2015-01-12 DIAGNOSIS — R001 Bradycardia, unspecified: Secondary | ICD-10-CM | POA: Insufficient documentation

## 2015-01-12 DIAGNOSIS — K219 Gastro-esophageal reflux disease without esophagitis: Secondary | ICD-10-CM | POA: Diagnosis not present

## 2015-01-12 DIAGNOSIS — Z87891 Personal history of nicotine dependence: Secondary | ICD-10-CM | POA: Diagnosis not present

## 2015-01-12 DIAGNOSIS — R002 Palpitations: Secondary | ICD-10-CM | POA: Diagnosis not present

## 2015-01-12 DIAGNOSIS — Z880 Allergy status to penicillin: Secondary | ICD-10-CM | POA: Diagnosis not present

## 2015-01-12 DIAGNOSIS — Z8659 Personal history of other mental and behavioral disorders: Secondary | ICD-10-CM | POA: Diagnosis not present

## 2015-01-12 DIAGNOSIS — R0789 Other chest pain: Secondary | ICD-10-CM | POA: Diagnosis not present

## 2015-01-12 DIAGNOSIS — R079 Chest pain, unspecified: Secondary | ICD-10-CM | POA: Diagnosis present

## 2015-01-12 LAB — BASIC METABOLIC PANEL
Anion gap: 5 (ref 5–15)
BUN: 16 mg/dL (ref 6–20)
CHLORIDE: 105 mmol/L (ref 101–111)
CO2: 26 mmol/L (ref 22–32)
CREATININE: 1 mg/dL (ref 0.61–1.24)
Calcium: 8.6 mg/dL — ABNORMAL LOW (ref 8.9–10.3)
GFR calc Af Amer: >60 mL/min (ref >60)
GFR calc non Af Amer: >60 mL/min (ref >60)
GLUCOSE: 129 mg/dL — AB (ref 65–99)
POTASSIUM: 3.8 mmol/L (ref 3.5–5.1)
SODIUM: 136 mmol/L (ref 135–145)

## 2015-01-12 LAB — CBC
HEMATOCRIT: 36.9 % — AB (ref 39.0–52.0)
Hemoglobin: 13.4 g/dL (ref 13.0–17.0)
MCH: 35.3 pg — AB (ref 26.0–34.0)
MCHC: 36.3 g/dL — ABNORMAL HIGH (ref 30.0–36.0)
MCV: 97.1 fL (ref 78.0–100.0)
PLATELETS: 142 10*3/uL — AB (ref 150–400)
RBC: 3.8 MIL/uL — ABNORMAL LOW (ref 4.22–5.81)
RDW: 13.3 % (ref 11.5–15.5)
WBC: 3.5 10*3/uL — ABNORMAL LOW (ref 4.0–10.5)

## 2015-01-12 LAB — RAPID URINE DRUG SCREEN, HOSP PERFORMED
Amphetamines: NOT DETECTED
BARBITURATES: NOT DETECTED
Benzodiazepines: NOT DETECTED
COCAINE: NOT DETECTED
Opiates: NOT DETECTED
TETRAHYDROCANNABINOL: NOT DETECTED

## 2015-01-12 LAB — TROPONIN I: Troponin I: <0.03 ng/mL (ref <0.031)

## 2015-01-12 MED ORDER — SUCRALFATE 1 GM/10ML PO SUSP
1.0000 g | Freq: Three times a day (TID) | ORAL | Status: DC
  Administered 2015-01-12: 1 g via ORAL
  Filled 2015-01-12: qty 10

## 2015-01-12 MED ORDER — PANTOPRAZOLE SODIUM 40 MG PO TBEC: 40.0000 mg | DELAYED_RELEASE_TABLET | Freq: Every day | ORAL | Status: DC

## 2015-01-12 NOTE — ED Provider Notes (Signed)
CSN: [id number]     Arrival date & time 01/12/15  5102 History   First MD Initiated Contact with Patient 01/12/15 [phone]     Chief Complaint  Patient presents with  . Chest Pain   HPI  [name] is a 49yo male presenting today for chest pain with officer. Pain occurred this morning while he was working in the kitchen. Became very hot and diaphoretic and developed substernal chest pain with radiation to throat...denies that pain radiated into neck or arm. Also noted mild shortness of breath. Went to nurses station and EMS was called. EMS reports he appeared diaphoretic when they arrived. Given 135mcg of fentanyl and 325mg  of aspirin as well as three nitroglycerin SL. States pain eased off since then, but is still present. Denies shortness of breath. Also reports history of congestion, coughing up mucous with black specks in it. Denies orthopnea, but states he might have dreamed he became short of breath a few weeks ago but that was the only time it has happened and he is not sure if it was real. Has been in prison for 8 months and denies drug or alcohol use during that time. History of panic attacks and cocaine abuse noted.    Past Medical History  Diagnosis Date  . Panic attacks    Past Surgical History  Procedure Laterality Date  . Shoulder surgery    . Hand surgery     Family History  Problem Relation Age of Onset  . Cancer Other   . Stroke Other   . Coronary artery disease Other    Social History  Substance Use Topics  . Smoking status: Former Smoker    Types: Cigarettes  . Smokeless tobacco: None  . Alcohol Use: Yes     Comment: heavy- pt denies    Review of Systems  Constitutional: Positive for diaphoresis.  Respiratory: Positive for shortness of breath.   Cardiovascular: Positive for chest pain and palpitations.      Allergies  Penicillins  Home Medications   Prior to Admission medications   Medication Sig Start Date End Date Taking? Authorizing Provider   Multiple Vitamin (MULTIVITAMIN WITH MINERALS) TABS tablet Take 1 tablet by mouth daily.   Yes Historical Provider, MD   BP 126/91 mmHg  Pulse 47  Temp(Src) 98.6 F (37 C) (Oral)  Resp 13  Ht 5\' 8"  (1.727 m)  Wt 225 lb (102.059 kg)  BMI 34.22 kg/m2  SpO2 99% Physical Exam  Constitutional: He appears well-developed and well-nourished. No distress.  Cardiovascular: Regular rhythm and intact distal pulses.  Exam reveals no gallop and no friction rub.   No murmur heard. Bradycardic  Pulmonary/Chest: Effort normal. No respiratory distress. He has no wheezes. He has no rales.  Abdominal: Soft. Bowel sounds are normal. He exhibits no distension and no mass. There is no tenderness.  Musculoskeletal: He exhibits no edema.  Neurological: He is alert. No cranial nerve deficit.  Skin: Skin is warm and dry. No rash noted.  No diaphoresis noted at this time  Psychiatric: He has a normal mood and affect. His behavior is normal.    ED Course  Procedures (including critical care time) Labs Review Labs Reviewed  BASIC METABOLIC PANEL - Abnormal; Notable for the following:    Glucose, Bld 129 (*)    Calcium 8.6 (*)    All other components within normal limits  CBC - Abnormal; Notable for the following:    WBC 3.5 (*)    RBC 3.80 (*)  HCT 36.9 (*)    MCH 35.3 (*)    MCHC 36.3 (*)    Platelets 142 (*)    All other components within normal limits  TROPONIN I  URINE RAPID DRUG SCREEN, HOSP PERFORMED   Imaging Review Dg Chest 2 View  01/12/2015   CLINICAL DATA:  Sudden onset of mid chest pain this morning with shortness of breath. Productive cough for 1 week.  EXAM: CHEST  2 VIEW  COMPARISON:  10/21/2010  FINDINGS: Lungs are clear without airspace disease or pulmonary edema. Heart and mediastinum are within normal limits. Postsurgical changes involving the lateral right clavicle. Degenerative endplate changes in the thoracic spine. No pleural effusions.  IMPRESSION: No active cardiopulmonary  disease.   Electronically Signed   By: Markus Daft M.D.   On: 01/12/2015 10:12   I have personally reviewed and evaluated these images and lab results as part of my medical decision-making.   EKG Interpretation   Date/Time:  Wednesday January 12 2015 09:35:52 EDT Ventricular Rate:  55 PR Interval:  141 QRS Duration: 108 QT Interval:  449 QTC Calculation: 429 R Axis:   81 Text Interpretation:  Sinus rhythm RSR' in V1 or V2, probably normal  variant Nonspecific T abnormalities, lateral leads Sinus rhythm RSR'  pattern in V1 T wave abnormality Artifact Abnormal ekg Confirmed by  Carmin Muskrat  MD ([id number]) on 01/12/2015 9:41:05 AM      MDM   Final diagnoses:  None  BMP with elevated glucose to 129, otherwise normal. CBC with mild leukopenia of 3.5 and mild thrombocytopenia of 142. Troponin negative. EKG WNL with mild bradycardia. UDS negative. Carafate given. Stable for discharge with PCP follow up. Prescription for Protonix given.    [place], [place] 01/12/15 [place], MD 01/16/15 [phone]

## 2015-01-12 NOTE — Discharge Instructions (Signed)

## 2015-01-12 NOTE — ED Notes (Addendum)
Pt reports was working this am and came into kitchen to eat breakfast. Pt reports at time of sitting down substernal chest pain. Per EMS, pt was diaphoretic upon arrival. Pt given 143mcg of fentanyl,325mg  of aspirin. cbg en route 120. nad noted.

## 2015-07-22 ENCOUNTER — Emergency Department (HOSPITAL_COMMUNITY): Payer: No Typology Code available for payment source

## 2015-07-22 ENCOUNTER — Encounter (HOSPITAL_COMMUNITY): Payer: Self-pay | Admitting: Emergency Medicine

## 2015-07-22 ENCOUNTER — Emergency Department (HOSPITAL_COMMUNITY)
Admission: EM | Admit: 2015-07-22 | Discharge: 2015-07-22 | Disposition: A | Discharge status: Home / Self Care | Attending: Emergency Medicine | Admitting: Emergency Medicine

## 2015-07-22 DIAGNOSIS — F17210 Nicotine dependence, cigarettes, uncomplicated: Secondary | ICD-10-CM | POA: Insufficient documentation

## 2015-07-22 DIAGNOSIS — J209 Acute bronchitis, unspecified: Secondary | ICD-10-CM | POA: Insufficient documentation

## 2015-07-22 DIAGNOSIS — Z8659 Personal history of other mental and behavioral disorders: Secondary | ICD-10-CM | POA: Insufficient documentation

## 2015-07-22 DIAGNOSIS — Z79899 Other long term (current) drug therapy: Secondary | ICD-10-CM | POA: Insufficient documentation

## 2015-07-22 DIAGNOSIS — Z880 Allergy status to penicillin: Secondary | ICD-10-CM | POA: Insufficient documentation

## 2015-07-22 LAB — I-STAT CHEM 8, ED
BUN: 8 mg/dL (ref 6–20)
CALCIUM ION: 1.04 mmol/L — AB (ref 1.12–1.23)
CHLORIDE: 101 mmol/L (ref 101–111)
Creatinine, Ser: 0.9 mg/dL (ref 0.61–1.24)
GLUCOSE: 144 mg/dL — AB (ref 65–99)
HCT: 49 % (ref 39.0–52.0)
Hemoglobin: 16.7 g/dL (ref 13.0–17.0)
Potassium: 3.4 mmol/L — ABNORMAL LOW (ref 3.5–5.1)
Sodium: 140 mmol/L (ref 135–145)
TCO2: 23 mmol/L (ref 0–100)

## 2015-07-22 LAB — I-STAT TROPONIN, ED
Troponin i, poc: 0.03 ng/mL (ref 0.00–0.08)
Troponin i, poc: 0.02 ng/mL (ref 0.00–0.08)

## 2015-07-22 MED ORDER — IBUPROFEN 400 MG PO TABS
600.0000 mg | ORAL_TABLET | Freq: Once | ORAL | Status: AC
  Administered 2015-07-22: 600 mg via ORAL
  Filled 2015-07-22: qty 1

## 2015-07-22 MED ORDER — ACETAMINOPHEN 500 MG PO TABS
1000.0000 mg | ORAL_TABLET | Freq: Once | ORAL | Status: AC
  Administered 2015-07-22: 1000 mg via ORAL
  Filled 2015-07-22: qty 2

## 2015-07-22 MED ORDER — AZITHROMYCIN 250 MG PO TABS: ORAL_TABLET | ORAL | Status: DC

## 2015-07-22 MED ORDER — ALBUTEROL SULFATE HFA 108 (90 BASE) MCG/ACT IN AERS
2.0000 | INHALATION_SPRAY | Freq: Once | RESPIRATORY_TRACT | Status: AC
  Administered 2015-07-22: 2 via RESPIRATORY_TRACT
  Filled 2015-07-22: qty 6.7

## 2015-07-22 NOTE — ED Notes (Signed)
Patient with history of two days of cough and congestion, now with chest pain in center of his chest.  Patient has been coughing up green and yellow phlegm.  Patient is complaining of shortness of breath at this time.

## 2015-07-22 NOTE — Discharge Instructions (Signed)
If you were given medicines take as directed.  If you are on coumadin or contraceptives realize their levels and effectiveness is altered by many different medicines.  If you have any reaction (rash, tongues swelling, other) to the medicines stop taking and see a physician.    If your blood pressure was elevated in the ER make sure you follow up for management with a primary doctor or return for chest pain, shortness of breath or stroke symptoms.  Please follow up as directed and return to the ER or see a physician for new or worsening symptoms.  Thank you. Filed Vitals:   07/22/15 0423 07/22/15 0424 07/22/15 0445 07/22/15 0500  BP: 162/84 162/84 125/73 133/75  Pulse: 25 83 89 85  Temp:  98.6 F (37 C)    TempSrc:  Oral    Resp: 24 30 21 30   Height:  5\' 9"  (1.753 m)    Weight:  220 lb (99.791 kg)    SpO2: 95% 95% 96% 95%

## 2015-07-22 NOTE — ED Provider Notes (Signed)
CSN: [id number]     Arrival date & time 07/22/15  0415 History   First MD Initiated Contact with Patient 07/22/15 0444     Chief Complaint  Patient presents with  . Chest Pain  . Nasal Congestion     (Consider location/radiation/quality/duration/timing/severity/associated sxs/prior Treatment) HPI Comments: 50 year old male with severe smoking, bronchitis history presents with 2 days of productive cough congestion anterior chest pain nonradiating. Productive cough green and yellow. No cardiac history. Recurrent coughing patient was unable to sleep tonight. No recent travel no significant sick contacts. No cardiac history.  Patient is a 49 y.o. male presenting with chest pain. The history is provided by the patient.  Chest Pain Associated symptoms: cough   Associated symptoms: no abdominal pain, no back pain, no fever, no headache, no shortness of breath and not vomiting     Past Medical History  Diagnosis Date  . Panic attacks    Past Surgical History  Procedure Laterality Date  . Shoulder surgery    . Hand surgery     Family History  Problem Relation Age of Onset  . Cancer Other   . Stroke Other   . Coronary artery disease Other    Social History  Substance Use Topics  . Smoking status: Current Every Day Smoker    Types: Cigarettes  . Smokeless tobacco: None  . Alcohol Use: Yes     Comment: heavy- pt denies    Review of Systems  Constitutional: Negative for fever and chills.  HENT: Positive for congestion.   Eyes: Negative for visual disturbance.  Respiratory: Positive for cough and wheezing. Negative for shortness of breath.   Cardiovascular: Positive for chest pain.  Gastrointestinal: Negative for vomiting and abdominal pain.  Genitourinary: Negative for dysuria and flank pain.  Musculoskeletal: Negative for back pain, neck pain and neck stiffness.  Skin: Negative for rash.  Neurological: Negative for light-headedness and headaches.      Allergies   Penicillins  Home Medications   Prior to Admission medications   Medication Sig Start Date End Date Taking? Authorizing Provider  Multiple Vitamin (MULTIVITAMIN WITH MINERALS) TABS tablet Take 1 tablet by mouth daily.    Historical Provider, MD  pantoprazole (PROTONIX) 40 MG tablet Take 1 tablet (40 mg total) by mouth daily. 01/12/15   [place], DO   BP 101/71 mmHg  Pulse 60  Temp(Src) 98.6 F (37 C) (Oral)  Resp 19  Ht 5\' 9"  (1.753 m)  Wt 220 lb (99.791 kg)  BMI 32.47 kg/m2  SpO2 93% Physical Exam  Constitutional: He is oriented to person, place, and time. He appears well-developed and well-nourished.  HENT:  Head: Normocephalic and atraumatic.  Congested recurrent coughing in the room  Eyes: Conjunctivae are normal. Right eye exhibits no discharge. Left eye exhibits no discharge.  Neck: Normal range of motion. Neck supple. No tracheal deviation present.  Cardiovascular: Normal rate and regular rhythm.   Pulmonary/Chest: Effort normal and breath sounds normal.  Abdominal: Soft. He exhibits no distension. There is no tenderness. There is no guarding.  Musculoskeletal: He exhibits no edema.  Neurological: He is alert and oriented to person, place, and time.  Skin: Skin is warm. No rash noted.  Psychiatric: He has a normal mood and affect.  Nursing note and vitals reviewed.   ED Course  Procedures (including critical care time) Labs Review Labs Reviewed  I-STAT CHEM 8, ED - Abnormal; Notable for the following:    Potassium 3.4 (*)    Glucose,  Bld 144 (*)    Calcium, Ion 1.04 (*)    All other components within normal limits  I-STAT TROPOININ, ED  Randolm Idol, ED    Imaging Review Dg Chest 2 View  07/22/2015  CLINICAL DATA:  Acute onset of generalized chest pain and shortness of breath. Cough. Initial encounter. EXAM: CHEST  2 VIEW COMPARISON:  Chest radiograph performed 01/12/2015 FINDINGS: The lungs are well-aerated. Vascular congestion is noted.  Peribronchial thickening is seen. Mild right midlung scarring is noted. There is no evidence of pleural effusion or pneumothorax. The heart is normal in size; the mediastinal contour is within normal limits. No acute osseous abnormalities are seen. IMPRESSION: Vascular congestion noted. Peribronchial thickening seen. Mild right midlung scarring noted. Electronically Signed   By: Garald Balding M.D.   On: 07/22/2015 06:25   I have personally reviewed and evaluated these images and lab results as part of my medical decision-making.   EKG Interpretation   Date/Time:  Friday July 22 2015 04:23:27 EST Ventricular Rate:  96 PR Interval:  122 QRS Duration: 97 QT Interval:  373 QTC Calculation: 471 R Axis:   90 Text Interpretation:  Sinus rhythm Borderline right axis deviation poor  baseline Confirmed by Itha Kroeker  MD, Jaleel Allen ([id number]) on 07/22/2015 4:56:08 AM      MDM   Final diagnoses:  Acute bronchitis, unspecified organism   Patient presents with clinical suspicion for bronchitis. Patient is also having nonspecific anterior chest pain. Discussed likely from recurrent coughing and muscle aches however with smoking history will screen with one troponin and EKG. Initial EKG poor baseline asked tech to repeat it. Plan for second troponin and outpatient follow-up with negative. Results and differential diagnosis were discussed with the patient/parent/guardian. Xrays were independently reviewed by myself.  Close follow up outpatient was discussed, comfortable with the plan.   Medications  acetaminophen (TYLENOL) tablet 1,000 mg (1,000 mg Oral Given 07/22/15 0535)  albuterol (PROVENTIL HFA;VENTOLIN HFA) 108 (90 Base) MCG/ACT inhaler 2 puff (2 puffs Inhalation Given 07/22/15 0535)  ibuprofen (ADVIL,MOTRIN) tablet 600 mg (600 mg Oral Given 07/22/15 0535)    Filed Vitals:   07/22/15 0445 07/22/15 0500 07/22/15 0530 07/22/15 0702  BP: 125/73 133/75  101/71  Pulse: 89 85 82 60  Temp:      TempSrc:       Resp: 21 30 20 19   Height:      Weight:      SpO2: 96% 95% 94% 93%    Final diagnoses:  Acute bronchitis, unspecified organism       Elnora Morrison, MD 07/22/15 [phone]

## 2015-10-08 ENCOUNTER — Inpatient Hospital Stay (HOSPITAL_COMMUNITY)
Admission: EM | Admit: 2015-10-08 | Discharge: 2015-10-11 | DRG: 190 | Disposition: A | Discharge status: Home / Self Care | Payer: Self-pay | Attending: Internal Medicine | Admitting: Internal Medicine

## 2015-10-08 ENCOUNTER — Encounter (HOSPITAL_COMMUNITY): Payer: Self-pay

## 2015-10-08 ENCOUNTER — Emergency Department (HOSPITAL_COMMUNITY): Payer: Self-pay

## 2015-10-08 DIAGNOSIS — J189 Pneumonia, unspecified organism: Secondary | ICD-10-CM | POA: Diagnosis present

## 2015-10-08 DIAGNOSIS — Z880 Allergy status to penicillin: Secondary | ICD-10-CM

## 2015-10-08 DIAGNOSIS — Z5900 Homelessness unspecified: Secondary | ICD-10-CM

## 2015-10-08 DIAGNOSIS — J441 Chronic obstructive pulmonary disease with (acute) exacerbation: Secondary | ICD-10-CM | POA: Diagnosis present

## 2015-10-08 DIAGNOSIS — Z9889 Other specified postprocedural states: Secondary | ICD-10-CM

## 2015-10-08 DIAGNOSIS — R55 Syncope and collapse: Secondary | ICD-10-CM | POA: Diagnosis present

## 2015-10-08 DIAGNOSIS — J209 Acute bronchitis, unspecified: Secondary | ICD-10-CM | POA: Diagnosis present

## 2015-10-08 DIAGNOSIS — R079 Chest pain, unspecified: Secondary | ICD-10-CM | POA: Diagnosis present

## 2015-10-08 DIAGNOSIS — J440 Chronic obstructive pulmonary disease with acute lower respiratory infection: Principal | ICD-10-CM | POA: Diagnosis present

## 2015-10-08 DIAGNOSIS — R059 Cough, unspecified: Secondary | ICD-10-CM

## 2015-10-08 DIAGNOSIS — I248 Other forms of acute ischemic heart disease: Secondary | ICD-10-CM | POA: Diagnosis present

## 2015-10-08 DIAGNOSIS — R05 Cough: Secondary | ICD-10-CM

## 2015-10-08 DIAGNOSIS — R0602 Shortness of breath: Secondary | ICD-10-CM | POA: Diagnosis present

## 2015-10-08 DIAGNOSIS — R0789 Other chest pain: Secondary | ICD-10-CM

## 2015-10-08 DIAGNOSIS — J449 Chronic obstructive pulmonary disease, unspecified: Secondary | ICD-10-CM | POA: Diagnosis present

## 2015-10-08 DIAGNOSIS — F1410 Cocaine abuse, uncomplicated: Secondary | ICD-10-CM | POA: Diagnosis present

## 2015-10-08 DIAGNOSIS — J439 Emphysema, unspecified: Secondary | ICD-10-CM

## 2015-10-08 DIAGNOSIS — Z720 Tobacco use: Secondary | ICD-10-CM

## 2015-10-08 HISTORY — DX: Chronic obstructive pulmonary disease, unspecified: J44.9

## 2015-10-08 LAB — URINALYSIS, ROUTINE W REFLEX MICROSCOPIC
Glucose, UA: 500 mg/dL — AB
Ketones, ur: NEGATIVE mg/dL
LEUKOCYTES UA: NEGATIVE
NITRITE: NEGATIVE
PH: 6 (ref 5.0–8.0)
Protein, ur: NEGATIVE mg/dL
SPECIFIC GRAVITY, URINE: 1.029 (ref 1.005–1.030)

## 2015-10-08 LAB — URINE MICROSCOPIC-ADD ON

## 2015-10-08 LAB — COMPREHENSIVE METABOLIC PANEL
ALBUMIN: 3.2 g/dL — AB (ref 3.5–5.0)
ALT: 59 U/L (ref 17–63)
AST: 39 U/L (ref 15–41)
Alkaline Phosphatase: 60 U/L (ref 38–126)
Anion gap: 8 (ref 5–15)
BUN: 7 mg/dL (ref 6–20)
CHLORIDE: 100 mmol/L — AB (ref 101–111)
CO2: 30 mmol/L (ref 22–32)
CREATININE: 0.97 mg/dL (ref 0.61–1.24)
Calcium: 9.1 mg/dL (ref 8.9–10.3)
GFR calc non Af Amer: >60 mL/min (ref >60)
Glucose, Bld: 174 mg/dL — ABNORMAL HIGH (ref 65–99)
Potassium: 3.7 mmol/L (ref 3.5–5.1)
SODIUM: 138 mmol/L (ref 135–145)
Total Bilirubin: 1.2 mg/dL (ref 0.3–1.2)
Total Protein: 7.5 g/dL (ref 6.5–8.1)

## 2015-10-08 LAB — CBC WITH DIFFERENTIAL/PLATELET
Basophils Absolute: 0 10*3/uL (ref 0.0–0.1)
Basophils Relative: 0 %
EOS ABS: 0 10*3/uL (ref 0.0–0.7)
Eosinophils Relative: 0 %
HEMATOCRIT: 45.2 % (ref 39.0–52.0)
Hemoglobin: 15.6 g/dL (ref 13.0–17.0)
LYMPHS ABS: 1.5 10*3/uL (ref 0.7–4.0)
Lymphocytes Relative: 18 %
MCH: 35.6 pg — ABNORMAL HIGH (ref 26.0–34.0)
MCHC: 34.5 g/dL (ref 30.0–36.0)
MCV: 103.2 fL — AB (ref 78.0–100.0)
MONOS PCT: 7 %
Monocytes Absolute: 0.6 10*3/uL (ref 0.1–1.0)
NEUTROS PCT: 75 %
Neutro Abs: 6.1 10*3/uL (ref 1.7–7.7)
Platelets: 198 10*3/uL (ref 150–400)
RBC: 4.38 MIL/uL (ref 4.22–5.81)
RDW: 12.9 % (ref 11.5–15.5)
WBC: 8.3 10*3/uL (ref 4.0–10.5)

## 2015-10-08 LAB — PROTIME-INR
INR: 1.12 (ref 0.00–1.49)
Prothrombin Time: 14.6 seconds (ref 11.6–15.2)

## 2015-10-08 LAB — RAPID URINE DRUG SCREEN, HOSP PERFORMED
AMPHETAMINES: NOT DETECTED
BARBITURATES: NOT DETECTED
BENZODIAZEPINES: NOT DETECTED
COCAINE: POSITIVE — AB
Opiates: NOT DETECTED
TETRAHYDROCANNABINOL: NOT DETECTED

## 2015-10-08 LAB — I-STAT TROPONIN, ED
Troponin i, poc: 0.02 ng/mL (ref 0.00–0.08)
Troponin i, poc: 0.02 ng/mL (ref 0.00–0.08)

## 2015-10-08 LAB — TROPONIN I: TROPONIN I: 0.1 ng/mL — AB (ref <0.031)

## 2015-10-08 LAB — ETHANOL

## 2015-10-08 MED ORDER — NITROGLYCERIN 2 % TD OINT
1.0000 [in_us] | TOPICAL_OINTMENT | Freq: Four times a day (QID) | TRANSDERMAL | Status: DC
  Administered 2015-10-09: 1 [in_us] via TOPICAL
  Filled 2015-10-08: qty 30

## 2015-10-08 MED ORDER — LEVOFLOXACIN IN D5W 750 MG/150ML IV SOLN
750.0000 mg | INTRAVENOUS | Status: DC
  Administered 2015-10-09 – 2015-10-10 (×2): 750 mg via INTRAVENOUS
  Filled 2015-10-08 (×2): qty 150

## 2015-10-08 MED ORDER — ASPIRIN 81 MG PO CHEW
81.0000 mg | CHEWABLE_TABLET | Freq: Once | ORAL | Status: AC
  Administered 2015-10-08: 81 mg via ORAL
  Filled 2015-10-08: qty 1

## 2015-10-08 MED ORDER — IPRATROPIUM-ALBUTEROL 0.5-2.5 (3) MG/3ML IN SOLN: 3.0000 mL | Freq: Once | RESPIRATORY_TRACT | Status: DC

## 2015-10-08 MED ORDER — LEVOFLOXACIN IN D5W 750 MG/150ML IV SOLN
750.0000 mg | Freq: Once | INTRAVENOUS | Status: AC
  Administered 2015-10-09: 750 mg via INTRAVENOUS
  Filled 2015-10-08: qty 150

## 2015-10-08 MED ORDER — ASPIRIN 325 MG PO TABS
325.0000 mg | ORAL_TABLET | Freq: Every day | ORAL | Status: DC
  Administered 2015-10-09 – 2015-10-11 (×3): 325 mg via ORAL
  Filled 2015-10-08 (×3): qty 1

## 2015-10-08 MED ORDER — NITROGLYCERIN 2 % TD OINT
1.0000 [in_us] | TOPICAL_OINTMENT | Freq: Once | TRANSDERMAL | Status: AC
  Administered 2015-10-09: 1 [in_us] via TOPICAL
  Filled 2015-10-08: qty 1

## 2015-10-08 MED ORDER — MORPHINE SULFATE (PF) 4 MG/ML IV SOLN
4.0000 mg | Freq: Once | INTRAVENOUS | Status: AC
  Administered 2015-10-08: 4 mg via INTRAVENOUS
  Filled 2015-10-08: qty 1

## 2015-10-08 MED ORDER — SODIUM CHLORIDE 0.9 % IV BOLUS (SEPSIS)
1000.0000 mL | Freq: Once | INTRAVENOUS | Status: AC
Start: 2015-10-08 — End: 2015-10-08
  Administered 2015-10-08: 1000 mL via INTRAVENOUS

## 2015-10-08 MED ORDER — ONDANSETRON HCL 4 MG/2ML IJ SOLN
4.0000 mg | Freq: Once | INTRAMUSCULAR | Status: AC
  Administered 2015-10-08: 4 mg via INTRAVENOUS
  Filled 2015-10-08: qty 2

## 2015-10-08 MED ORDER — IPRATROPIUM-ALBUTEROL 0.5-2.5 (3) MG/3ML IN SOLN
3.0000 mL | RESPIRATORY_TRACT | Status: DC
  Administered 2015-10-09: 3 mL via RESPIRATORY_TRACT
  Filled 2015-10-08: qty 3

## 2015-10-08 NOTE — ED Notes (Signed)
Pt returned from X-ray.  

## 2015-10-08 NOTE — H&P (Signed)
[facility] Admission History and Physical       Life Simic [id number] DOB: 07/13/1965 DOA: 10/08/2015  Referring physician: EDP PCP: No PCP Per Patient  Specialists:   Chief Complaint: Passed Out  HPI: [name] is a 50 y.o. male with a history of COPD who was brought to the ED with complaints of passing  Out this afternoon.  He reports having intermittent Chest pain for the past 2 weeks.   He also reports having worsening SOB and cough productive of white sputum for the past 2 days. He reports subjective fevers.    He was evaluated in the ED and referred for further workup.      Review of Systems:    Constitutional: No Weight Loss, No Weight Gain, Night Sweats, Fevers, Chills, Dizziness, Light Headedness, Fatigue, or Generalized Weakness HEENT: No Headaches, Difficulty Swallowing,Tooth/Dental Problems,Sore Throat,  No Sneezing, Rhinitis, Ear Ache, Nasal Congestion, or Post Nasal Drip,  Cardio-vascular:  +Chest pain, Orthopnea, PND, Edema in Lower Extremities, Anasarca, Dizziness, Palpitations  Resp:  +Dyspnea, No DOE, +Productive Cough, No Non-Productive Cough, No Hemoptysis, No Wheezing.    GI: No Heartburn, Indigestion, Abdominal Pain, Nausea, Vomiting, Diarrhea, Constipation, Hematemesis, Hematochezia, Melena, Change in Bowel Habits,  Loss of Appetite  GU: No Dysuria, No Change in Color of Urine, No Urgency or Urinary Frequency, No Flank pain.  Musculoskeletal: No Joint Pain or Swelling, No Decreased Range of Motion, No Back Pain.  Neurologic:  +Syncope, No Seizures, Muscle Weakness, Paresthesia, Vision Disturbance or Loss, No Diplopia, No Vertigo, No Difficulty Walking,  Skin: No Rash or Lesions. Psych: No Change in Mood or Affect, No Depression or Anxiety, No Memory loss, No Confusion, or Hallucinations   Past Medical History  Diagnosis Date  . COPD (chronic obstructive pulmonary disease) [facility])      Past Surgical History  Procedure  Laterality Date  . Hand surgery    . Foot surgery    . Shoulder surgery        Prior to Admission medications   Not on File     Allergies  Allergen Reactions  . Penicillins Other (See Comments)    Social History:  reports that he has been smoking Cigarettes.  He does not have any smokeless tobacco history on file. He reports that he drinks alcohol. He reports that he uses illicit drugs (Cocaine).     History reviewed. No pertinent family history.     Physical Exam:  GEN:  Pleasant Obese 50 y.o. Caucasian male examined and in no acute distress; cooperative with exam Filed Vitals:   10/08/15 1930 10/08/15 2000 10/08/15 2100 10/08/15 2130  BP: 169/95 173/93 160/107 143/110  Pulse: 76 77 76 77  Temp:      TempSrc:      Resp: 33 28 27 28   Height:      Weight:      SpO2: 93% 89% 91% 92%   Blood pressure 143/110, pulse 77, temperature 98 F (36.7 C), temperature source Oral, resp. rate 28, height 5\' 8"  (1.727 m), weight 99.791 kg (220 lb), SpO2 92 %. PSYCH: He is alert and oriented x4; does not appear anxious does not appear depressed; affect is normal HEENT: Normocephalic and Atraumatic, Mucous membranes pink; PERRLA; EOM intact; Fundi:  Benign;  No scleral icterus, Nares: Patent, Oropharynx: Clear, Fair Dentition,    Neck:  FROM, No Cervical Lymphadenopathy nor Thyromegaly or Carotid Bruit; No JVD; Breasts:: Not examined CHEST WALL: No tenderness CHEST: Normal respiration, clear to auscultation bilaterally  HEART: Regular rate and rhythm; no murmurs rubs or gallops BACK: No kyphosis or scoliosis; No CVA tenderness ABDOMEN: Positive Bowel Sounds, Obese, Soft Non-Tender, No Rebound or Guarding; No Masses, No Organomegaly. Rectal Exam: Not done EXTREMITIES: No Cyanosis, Clubbing, or Edema; No Ulcerations. Genitalia: not examined PULSES: 2+ and symmetric SKIN: Normal hydration no rash or ulceration CNS:  Alert and Oriented x 4, No Focal Deficits Vascular: pulses palpable  throughout    Labs on Admission:  Basic Metabolic Panel:  Recent Labs Lab 10/08/15 1703  NA 138  K 3.7  CL 100*  CO2 30  GLUCOSE 174*  BUN 7  CREATININE 0.97  CALCIUM 9.1   Liver Function Tests:  Recent Labs Lab 10/08/15 1703  AST 39  ALT 59  ALKPHOS 60  BILITOT 1.2  PROT 7.5  ALBUMIN 3.2*   No results for input(s): LIPASE, AMYLASE in the last 168 hours. No results for input(s): AMMONIA in the last 168 hours. CBC:  Recent Labs Lab 10/08/15 1703  WBC 8.3  NEUTROABS 6.1  HGB 15.6  HCT 45.2  MCV 103.2*  PLT 198   Cardiac Enzymes: No results for input(s): CKTOTAL, CKMB, CKMBINDEX, TROPONINI in the last 168 hours.  BNP (last 3 results) No results for input(s): BNP in the last 8760 hours.  ProBNP (last 3 results) No results for input(s): PROBNP in the last 8760 hours.  CBG: No results for input(s): GLUCAP in the last 168 hours.  Radiological Exams on Admission: Dg Chest 2 View  10/08/2015  CLINICAL DATA:  50 year old with current history of COPD presenting with 2 day history of shortness of breath and productive cough. EXAM: CHEST  2 VIEW COMPARISON:  None. FINDINGS: Cardiac silhouette normal in size. Thoracic aorta mildly atherosclerotic and tortuous. Hilar and mediastinal contours otherwise unremarkable. Prominent bronchovascular markings diffusely and moderate central peribronchial thickening. Lungs otherwise clear. No localized airspace consolidation. No pleural effusions. No pneumothorax. Normal pulmonary vascularity. Thoracolumbar dextroscoliosis and degenerative changes throughout the thoracic spine. IMPRESSION: Moderate changes of bronchitis and/or asthma without focal airspace pneumonia. Electronically Signed   By: Evangeline Dakin M.D.   On: 10/08/2015 17:44   Dg Wrist Complete Left  10/08/2015  CLINICAL DATA:  50 year old who fell and injured the left wrist. Medial pain. Initial encounter. EXAM: LEFT WRIST - COMPLETE 3+ VIEW COMPARISON:  None.  FINDINGS: No evidence of acute fracture or dislocation. Remote healed fracture involving the distal ulnar metaphysis. Prior ORIF of a fracture involving the base of the 1st metacarpal with normal healing. Mild radiocarpal and mild intercarpal joint space narrowing. Moderate trapezium-1st metacarpal joint space narrowing. IMPRESSION: 1. No acute osseous abnormality. 2. Osteoarthritis. 3. Remote healed fracture involving the distal ulnar metaphysis. Prior ORIF of a fracture involving the base of the 1st metacarpal with normal healing. Electronically Signed   By: Evangeline Dakin M.D.   On: 10/08/2015 17:46     EKG: Independently reviewed. Normal Sinus Rhythm Rate = 67, Ischemic Changes in Inferior Leads   Assessment/Plan:      50 y.o. male with  Principal Problem:    Syncope and collapse   Telemetry monitoring   Check Orthostatics every Shift   IVFs    Active Problems:    SOB (shortness of breath)- due to COPD and Bronchitis   DuoNebs   Mucinex BID   O2 PRN         Chest pain   Telemetry Monitoring   Cycle Troponins   Nitropaste, O2 ASA,    Check  Fasting Lipids   2D ECHO in AM        COPD (chronic obstructive pulmonary disease) (HCC)   DuoNebs   O2 PRN      Acute bronchitis   IV Levaquin    Cocaine Abuse   Counseled   Social Work Consult    DVT Prophylaxis   Lovenox          Code Status:     FULL CODE       Family Communication:   No Family Present    Disposition Plan:    Observation Status        Time spent: [place] [facility] Pager [phone]   If [place] Please Contact the Day Rounding Team MD for [facility]  If 7PM-7AM, Please Contact Night-Floor Coverage  [email or web address] Password [email or web address] 10/08/2015, 11:27 PM     ADDENDUM:   Patient was seen and examined on 10/08/2015

## 2015-10-08 NOTE — ED Notes (Signed)
Gave pt bag lunch and sprite per provider

## 2015-10-08 NOTE — ED Notes (Signed)
Pt. Coming from [facility] via [facility] for witnessed syncope today in parking lot. Pt. Walking in parking lot when he fell and passed out. Witnesses report pt. Unconscious for around 30 seconds. Pt. Did hit his left hand on the road and is complaining of severe pain in that joint. EMS reports no deformities at this time. Pt. Denies eating or drinking anything today. Pt. Has unknown medical hx and is homeless. EMS reports pt. Passed their spinal precautions and was ambulatory on scene.

## 2015-10-08 NOTE — ED Provider Notes (Signed)
CSN: [id number]     Arrival date & time 10/08/15  1620 History   First MD Initiated Contact with Patient 10/08/15 1632     Chief Complaint  Patient presents with  . Loss of Consciousness     (Consider location/radiation/quality/duration/timing/severity/associated sxs/prior Treatment) HPI   He is a 50 year old homeless male with COPD who presents to the ER after a syncopal episode around roughly 4 PM today. Patient states he has had increased shortness of breath for 2 weeks and a productive cough for 2 days. He was walking to [employer] to get a drink when he started to cough, he leaned up against the wall, and passed out. He states he has had nothing to eat or drink today. He is complaining of pain in the ulnar aspect of his left wrist area and no deformity noted, pain is constant, nonradiating, throbbing. Patient is complaining of a frontal headache x 2 days that is throbbing, constant, he is not taking anything for her, nothing makes it better nothing is worse. His son complaining of substernal chest pain for 2 days that is sharp, intermittent, nothing makes it better nothing makes it worse, he's ever had this before. He states associated nausea, diarrhea, 1 episode of emesis today. Denies numbness/tingling, weakness, abdominal pain.  Past Medical History  Diagnosis Date  . COPD (chronic obstructive pulmonary disease) [facility])    Past Surgical History  Procedure Laterality Date  . Hand surgery    . Foot surgery    . Shoulder surgery     History reviewed. No pertinent family history. Social History  Substance Use Topics  . Smoking status: Current Every Day Smoker    Types: Cigarettes  . Smokeless tobacco: None  . Alcohol Use: Yes    Review of Systems  Constitutional: Positive for fatigue. Negative for fever, chills and diaphoresis.  HENT: Negative for sore throat and trouble swallowing.   Eyes: Negative for visual disturbance.  Respiratory: Positive for cough and shortness of  breath. Negative for choking, chest tightness and wheezing.   Cardiovascular: Positive for chest pain. Negative for leg swelling.  Gastrointestinal: Positive for nausea, vomiting and diarrhea. Negative for abdominal pain.  Genitourinary: Negative for dysuria and hematuria.  Musculoskeletal: Positive for arthralgias. Negative for back pain and neck pain.  Skin: Positive for color change. Negative for rash.  Neurological: Positive for syncope and headaches. Negative for dizziness, speech difficulty, weakness, light-headedness and numbness.  Psychiatric/Behavioral: Negative for confusion and agitation.      Allergies  Penicillins  Home Medications   Prior to Admission medications   Not on File   BP 159/81 mmHg  Pulse 67  Temp(Src) 98 F (36.7 C) (Oral)  Resp 36  Ht 5\' 8"  (1.727 m)  Wt 99.791 kg  BMI 33.46 kg/m2  SpO2 97% Physical Exam  Constitutional: He is oriented to person, place, and time. He appears well-developed and well-nourished. No distress.  HENT:  Head: Normocephalic and atraumatic.  Eyes: Conjunctivae and EOM are normal. Pupils are equal, round, and reactive to light.  Cardiovascular: Normal rate and normal heart sounds.   Pulmonary/Chest: Effort normal and breath sounds normal. No respiratory distress. He has no wheezes. He has no rales.  Tachypneic on exam  Abdominal: Soft. Bowel sounds are normal. There is no tenderness.  Abdomen appears mildly distended, possible umbilical hernia that appears reproducible, no pain with palpation. Patient states he's had it for years.  Musculoskeletal: He exhibits no edema.  Examination of left wrist revealed no deformities,  contusion noted to the medial palmar aspect, no edema noted, grip strength 4/5 due to pain, TTP of the ulnar aspect of wrist. ROM limited due to pain. Examination of right upper extremity revealed normal ROM, strength 5/5.   Neurological: He is alert and oriented to person, place, and time. No cranial nerve  deficit. Coordination normal.  Pronator drift negative, normal coordination, sensation intact, strength 5/5 of bilateral lower extremities, no focal deficits noted. Cranial nerves III through XII grossly intact.  Skin: Skin is warm and dry. He is not diaphoretic. There is erythema.  Blanching erythema noted to upper back, face, shoulders. Patient has a tick attached to the lateral left side of his back.  Psychiatric: He has a normal mood and affect. His behavior is normal.    ED Course  Procedures (including critical care time) Labs Review Labs Reviewed  COMPREHENSIVE METABOLIC PANEL - Abnormal; Notable for the following:    Chloride 100 (*)    Glucose, Bld 174 (*)    Albumin 3.2 (*)    All other components within normal limits  CBC WITH DIFFERENTIAL/PLATELET - Abnormal; Notable for the following:    MCV 103.2 (*)    MCH 35.6 (*)    All other components within normal limits  URINALYSIS, ROUTINE W REFLEX MICROSCOPIC (NOT AT [facility]) - Abnormal; Notable for the following:    Color, Urine AMBER (*)    APPearance CLOUDY (*)    Glucose, UA 500 (*)    Hgb urine dipstick SMALL (*)    Bilirubin Urine SMALL (*)    All other components within normal limits  URINE RAPID DRUG SCREEN, HOSP PERFORMED - Abnormal; Notable for the following:    Cocaine POSITIVE (*)    All other components within normal limits  URINE MICROSCOPIC-ADD ON - Abnormal; Notable for the following:    Squamous Epithelial / LPF 0-5 (*)    Bacteria, UA MANY (*)    Crystals TRIPLE PHOSPHATE CRYSTALS (*)    All other components within normal limits  TROPONIN I - Abnormal; Notable for the following:    Troponin I 0.10 (*)    All other components within normal limits  PROTIME-INR  ETHANOL  LIPID PANEL  I-STAT TROPOININ, ED  I-STAT TROPOININ, ED    Imaging Review Dg Chest 2 View  10/08/2015  CLINICAL DATA:  50 year old with current history of COPD presenting with 2 day history of shortness of breath and productive cough.  EXAM: CHEST  2 VIEW COMPARISON:  None. FINDINGS: Cardiac silhouette normal in size. Thoracic aorta mildly atherosclerotic and tortuous. Hilar and mediastinal contours otherwise unremarkable. Prominent bronchovascular markings diffusely and moderate central peribronchial thickening. Lungs otherwise clear. No localized airspace consolidation. No pleural effusions. No pneumothorax. Normal pulmonary vascularity. Thoracolumbar dextroscoliosis and degenerative changes throughout the thoracic spine. IMPRESSION: Moderate changes of bronchitis and/or asthma without focal airspace pneumonia. Electronically Signed   By: Evangeline Dakin M.D.   On: 10/08/2015 17:44   Dg Wrist Complete Left  10/08/2015  CLINICAL DATA:  50 year old who fell and injured the left wrist. Medial pain. Initial encounter. EXAM: LEFT WRIST - COMPLETE 3+ VIEW COMPARISON:  None. FINDINGS: No evidence of acute fracture or dislocation. Remote healed fracture involving the distal ulnar metaphysis. Prior ORIF of a fracture involving the base of the 1st metacarpal with normal healing. Mild radiocarpal and mild intercarpal joint space narrowing. Moderate trapezium-1st metacarpal joint space narrowing. IMPRESSION: 1. No acute osseous abnormality. 2. Osteoarthritis. 3. Remote healed fracture involving the distal ulnar metaphysis.  Prior ORIF of a fracture involving the base of the 1st metacarpal with normal healing. Electronically Signed   By: Evangeline Dakin M.D.   On: 10/08/2015 17:46   I have personally reviewed and evaluated these images and lab results as part of my medical decision-making.   EKG Interpretation   Date/Time:  Saturday Oct 08 2015 20:15:26 EDT Ventricular Rate:  67 PR Interval:  124 QRS Duration: 93 QT Interval:  414 QTC Calculation: 437 R Axis:   87 Text Interpretation:  Sinus rhythm RSR' in V1 or V2, probably normal  variant LVH with secondary repolarization abnormality ST depr, consider  ischemia, inferior leads agree. no  change from prior EKG Confirmed by  Johnney Killian, MD, Jeannie Done [phone]) on 10/08/2015 10:32:25 PM      MDM   Final diagnoses:  Shortness of breath  Cough  Community acquired pneumonia  Syncope and collapse  Cocaine abuse  Homeless single person   Homeless patient complaining of chest pain and a syncopal episode. Left wrist x-ray reviewed by me revealed no acute bony abnormalities. Patient with tachypnea, elevated blood pressure, new oxygen requirement, slightly elevated troponin, EKG with ST depression, positive cocaine drug screen, concerns for acute cardiac ischemia and/or pneumonia. Urinalysis concerning for UTI although patient asymptomatic. Other labs unremarkable. Tick was removed by the nurse.  Dr. Johnney Killian consulted the hospitalist team who will admit the patient for further evaluation and treatment.     Kalman Drape, PA 10/09/15 [id number]  Charlesetta Shanks, MD 10/13/15 [phone]

## 2015-10-09 ENCOUNTER — Encounter (HOSPITAL_COMMUNITY): Payer: Self-pay | Admitting: General Practice

## 2015-10-09 ENCOUNTER — Observation Stay (HOSPITAL_BASED_OUTPATIENT_CLINIC_OR_DEPARTMENT_OTHER): Payer: Self-pay

## 2015-10-09 DIAGNOSIS — Z5900 Homelessness unspecified: Secondary | ICD-10-CM | POA: Insufficient documentation

## 2015-10-09 DIAGNOSIS — R55 Syncope and collapse: Secondary | ICD-10-CM

## 2015-10-09 DIAGNOSIS — R079 Chest pain, unspecified: Secondary | ICD-10-CM

## 2015-10-09 DIAGNOSIS — F1410 Cocaine abuse, uncomplicated: Secondary | ICD-10-CM | POA: Diagnosis present

## 2015-10-09 LAB — CBC
HEMATOCRIT: 41.9 % (ref 39.0–52.0)
Hemoglobin: 14.3 g/dL (ref 13.0–17.0)
MCH: 35.4 pg — AB (ref 26.0–34.0)
MCHC: 34.1 g/dL (ref 30.0–36.0)
MCV: 103.7 fL — AB (ref 78.0–100.0)
PLATELETS: 183 10*3/uL (ref 150–400)
RBC: 4.04 MIL/uL — ABNORMAL LOW (ref 4.22–5.81)
RDW: 12.9 % (ref 11.5–15.5)
WBC: 9.4 10*3/uL (ref 4.0–10.5)

## 2015-10-09 LAB — ECHOCARDIOGRAM COMPLETE
Height: 68 in
Weight: 3492.8 oz

## 2015-10-09 LAB — LIPID PANEL
CHOL/HDL RATIO: 3.7 ratio
CHOLESTEROL: 103 mg/dL (ref 0–200)
HDL: 28 mg/dL — AB (ref >40)
LDL Cholesterol: 66 mg/dL (ref 0–99)
TRIGLYCERIDES: 45 mg/dL (ref <150)
VLDL: 9 mg/dL (ref 0–40)

## 2015-10-09 LAB — TROPONIN I
Troponin I: 0.06 ng/mL — ABNORMAL HIGH (ref <0.031)
Troponin I: 0.07 ng/mL — ABNORMAL HIGH (ref <0.031)
Troponin I: 0.04 ng/mL — ABNORMAL HIGH (ref <0.031)

## 2015-10-09 LAB — MRSA PCR SCREENING: MRSA BY PCR: NEGATIVE

## 2015-10-09 LAB — BASIC METABOLIC PANEL
Anion gap: 7 (ref 5–15)
BUN: 6 mg/dL (ref 6–20)
CHLORIDE: 100 mmol/L — AB (ref 101–111)
CO2: 28 mmol/L (ref 22–32)
CREATININE: 0.91 mg/dL (ref 0.61–1.24)
Calcium: 8.3 mg/dL — ABNORMAL LOW (ref 8.9–10.3)
GFR calc Af Amer: >60 mL/min (ref >60)
GFR calc non Af Amer: >60 mL/min (ref >60)
Glucose, Bld: 139 mg/dL — ABNORMAL HIGH (ref 65–99)
POTASSIUM: 3.9 mmol/L (ref 3.5–5.1)
Sodium: 135 mmol/L (ref 135–145)

## 2015-10-09 MED ORDER — ONDANSETRON HCL 4 MG PO TABS: 4.0000 mg | ORAL_TABLET | Freq: Four times a day (QID) | ORAL | Status: DC | PRN

## 2015-10-09 MED ORDER — OXYCODONE HCL 5 MG PO TABS
5.0000 mg | ORAL_TABLET | ORAL | Status: DC | PRN
  Administered 2015-10-09 – 2015-10-11 (×4): 5 mg via ORAL
  Filled 2015-10-09 (×4): qty 1

## 2015-10-09 MED ORDER — ACETAMINOPHEN 325 MG PO TABS: 650.0000 mg | ORAL_TABLET | Freq: Four times a day (QID) | ORAL | Status: DC | PRN

## 2015-10-09 MED ORDER — IPRATROPIUM-ALBUTEROL 0.5-2.5 (3) MG/3ML IN SOLN
3.0000 mL | Freq: Two times a day (BID) | RESPIRATORY_TRACT | Status: DC
  Administered 2015-10-10: 3 mL via RESPIRATORY_TRACT
  Filled 2015-10-09 (×2): qty 3

## 2015-10-09 MED ORDER — SODIUM CHLORIDE 0.9% FLUSH
3.0000 mL | Freq: Two times a day (BID) | INTRAVENOUS | Status: DC
  Administered 2015-10-10 – 2015-10-11 (×2): 3 mL via INTRAVENOUS

## 2015-10-09 MED ORDER — DM-GUAIFENESIN ER 30-600 MG PO TB12
1.0000 | ORAL_TABLET | Freq: Two times a day (BID) | ORAL | Status: DC
  Administered 2015-10-09 – 2015-10-11 (×6): 1 via ORAL
  Filled 2015-10-09 (×6): qty 1

## 2015-10-09 MED ORDER — ENOXAPARIN SODIUM 40 MG/0.4ML ~~LOC~~ SOLN
40.0000 mg | SUBCUTANEOUS | Status: DC
  Administered 2015-10-09 – 2015-10-10 (×2): 40 mg via SUBCUTANEOUS
  Filled 2015-10-09 (×2): qty 0.4

## 2015-10-09 MED ORDER — ONDANSETRON HCL 4 MG/2ML IJ SOLN: 4.0000 mg | Freq: Four times a day (QID) | INTRAMUSCULAR | Status: DC | PRN

## 2015-10-09 MED ORDER — GUAIFENESIN-DM 100-10 MG/5ML PO SYRP
5.0000 mL | ORAL_SOLUTION | ORAL | Status: DC | PRN
  Administered 2015-10-09 – 2015-10-10 (×2): 5 mL via ORAL
  Filled 2015-10-09 (×2): qty 5

## 2015-10-09 MED ORDER — IPRATROPIUM-ALBUTEROL 0.5-2.5 (3) MG/3ML IN SOLN
3.0000 mL | Freq: Three times a day (TID) | RESPIRATORY_TRACT | Status: DC
  Administered 2015-10-09 (×3): 3 mL via RESPIRATORY_TRACT
  Filled 2015-10-09 (×3): qty 3

## 2015-10-09 MED ORDER — HYDROMORPHONE HCL 1 MG/ML IJ SOLN: 0.5000 mg | INTRAMUSCULAR | Status: DC | PRN

## 2015-10-09 MED ORDER — METHYLPREDNISOLONE SODIUM SUCC 125 MG IJ SOLR
60.0000 mg | Freq: Two times a day (BID) | INTRAMUSCULAR | Status: DC
  Administered 2015-10-09 – 2015-10-11 (×5): 60 mg via INTRAVENOUS
  Filled 2015-10-09 (×5): qty 2

## 2015-10-09 MED ORDER — BUDESONIDE 0.25 MG/2ML IN SUSP
0.2500 mg | Freq: Two times a day (BID) | RESPIRATORY_TRACT | Status: DC
  Administered 2015-10-09 – 2015-10-10 (×3): 0.25 mg via RESPIRATORY_TRACT
  Filled 2015-10-09 (×4): qty 2

## 2015-10-09 MED ORDER — SODIUM CHLORIDE 0.9 % IV SOLN
INTRAVENOUS | Status: DC
  Administered 2015-10-09 – 2015-10-10 (×2): via INTRAVENOUS

## 2015-10-09 MED ORDER — ACETAMINOPHEN 650 MG RE SUPP: 650.0000 mg | Freq: Four times a day (QID) | RECTAL | Status: DC | PRN

## 2015-10-09 NOTE — Progress Notes (Signed)
   10/09/15 0101  Vitals  Temp 98 F (36.7 C)  Temp Source Oral  BP (!) 145/79 mmHg  BP Location Left Arm  BP Method Automatic  Patient Position (if appropriate) Lying  Pulse Rate 83  Pulse Rate Source Dinamap  Resp 18  Oxygen Therapy  SpO2 94 %  O2 Device Nasal Cannula  O2 Flow Rate (L/min) 2 L/min  Height and Weight  Height 5\' 8"  (1.727 m)  Weight 99.02 kg (218 lb 4.8 oz) (scale c)  Type of Scale Used Standing  BSA (Calculated - sq m) 2.18 sq meters  BMI (Calculated) 33.3  Weight in (lb) to have BMI = 25 164.1  Admitted pt to rm 3E19 from ED, pt alert and oriented, denied pain at this time, oriented to room, call bell placed within reach, orders carried out. Will continue to monitor.

## 2015-10-09 NOTE — Progress Notes (Signed)
  Echocardiogram 2D Echocardiogram has been performed.  Diamond Nickel 10/09/2015, 12:39 PM

## 2015-10-09 NOTE — Progress Notes (Signed)
PROGRESS NOTE  [name] [id number] DOB: 07/13/1965 DOA: 10/08/2015 PCP: No PCP Per Patient     Brief Narrative: 50 year old homeless male with history of tobacco abuse, admitted on 5/20 for progressive shortness of breath and cough for the past couple weeks and increasing sputum production. He also reports fevers. He was admitted for COPD exacerbation.  Assessment & Plan: Principal Problem:   Syncope and collapse Active Problems:   SOB (shortness of breath)   Chest pain   COPD (chronic obstructive pulmonary disease) (HCC)   Acute bronchitis   Cocaine abuse   COPD exacerbation due to CAP - Patient with significant wheezing this morning, start IV steroids, start Pulmicort, scheduled DuoNeb's - Continue levofloxacin  Chest pain - Likely in the setting of severe cough, does not report typical chest pain, troponins mildly elevated 0.1 >> 0.06 >> 0.07, flat, likely demand ischemia in the setting of COPD exacerbation. Discontinue nitro paste.  Polysubstance abuse - Patient only claims tobacco use however his UDS was positive for cocaine - It appears that he is minimizing his alcohol use, stating that he drinks every other weekend  Homelessness - Social worker consult    DVT prophylaxis: Lovenox  Code Status: Full code  Family Communication: No family at bedside  Disposition Plan: Home when ready  Consultants:   None  Procedures:   None   Antimicrobials:  Levaquin 5/20 >>   Subjective: - Continues to complain of left wrist pain, while I was in the room he had 2 prolonged coughing spells, appearing extremely uncomfortable and about to pass out  Objective: Filed Vitals:   10/09/15 0101 10/09/15 0550 10/09/15 0705 10/09/15 0945  BP: 145/79  140/82   Pulse: 83  65   Temp: 98 F (36.7 C) 98.6 F (37 C) 97.9 F (36.6 C)   TempSrc: Oral Oral Oral   Resp: 18 18 22    Height: 5\' 8"  (1.727 m)     Weight: 99.02 kg (218 lb 4.8 oz)     SpO2: 94% 93% 93%  95%    Intake/Output Summary (Last 24 hours) at 10/09/15 1237 Last data filed at 10/09/15 0930  Gross per 24 hour  Intake 1241.25 ml  Output    625 ml  Net 616.25 ml   Filed Weights   10/08/15 1623 10/09/15 0101  Weight: 99.791 kg (220 lb) 99.02 kg (218 lb 4.8 oz)    Examination: Constitutional: Flushed face, coughing, uncomfortable  Filed Vitals:   10/09/15 0101 10/09/15 0550 10/09/15 0705 10/09/15 0945  BP: 145/79  140/82   Pulse: 83  65   Temp: 98 F (36.7 C) 98.6 F (37 C) 97.9 F (36.6 C)   TempSrc: Oral Oral Oral   Resp: 18 18 22    Height: 5\' 8"  (1.727 m)     Weight: 99.02 kg (218 lb 4.8 oz)     SpO2: 94% 93% 93% 95%   ENMT: Mucous membranes are moist. Respiratory: Bilateral wheezing, overall diminished breath sounds  Cardiovascular: Regular rate and rhythm, no murmurs / rubs / gallops. No LE edema. 2+ pedal pulses. No carotid bruits.  Abdomen: no tenderness. Bowel sounds positive.  Musculoskeletal: no clubbing / cyanosis. + joint deformity thumb on left No contractures. Normal muscle tone.  Skin: no rashes, lesions, ulcers. No induration. Tattoos present  Neurologic: CN 2-12 grossly intact. Strength 5/5 in all 4.  Psychiatric: Normal judgment and insight. Alert and oriented x 3. Appears anxious   Data Reviewed: I have personally reviewed following labs and  imaging studies  CBC:  Recent Labs Lab 10/08/15 1703 10/09/15 0211  WBC 8.3 9.4  NEUTROABS 6.1  --   HGB 15.6 14.3  HCT 45.2 41.9  MCV 103.2* 103.7*  PLT 198 [id number]   Basic Metabolic Panel:  Recent Labs Lab 10/08/15 1703 10/09/15 0211  NA 138 135  K 3.7 3.9  CL 100* 100*  CO2 30 28  GLUCOSE 174* 139*  BUN 7 6  CREATININE 0.97 0.91  CALCIUM 9.1 8.3*   GFR: Estimated Creatinine Clearance: 109.5 mL/min (by C-G formula based on Cr of 0.91). Liver Function Tests:  Recent Labs Lab 10/08/15 1703  AST 39  ALT 59  ALKPHOS 60  BILITOT 1.2  PROT 7.5  ALBUMIN 3.2*   No results for  input(s): LIPASE, AMYLASE in the last 168 hours. No results for input(s): AMMONIA in the last 168 hours. Coagulation Profile:  Recent Labs Lab 10/08/15 1703  INR 1.12   Cardiac Enzymes:  Recent Labs Lab 10/08/15 2230 10/09/15 0211 10/09/15 0712  TROPONINI 0.10* 0.06* 0.07*   BNP (last 3 results) No results for input(s): PROBNP in the last 8760 hours. HbA1C: No results for input(s): HGBA1C in the last 72 hours. CBG: No results for input(s): GLUCAP in the last 168 hours. Lipid Profile:  Recent Labs  10/09/15 0211  CHOL 103  HDL 28*  LDLCALC 66  TRIG 45  CHOLHDL 3.7   Thyroid Function Tests: No results for input(s): TSH, T4TOTAL, FREET4, T3FREE, THYROIDAB in the last 72 hours. Anemia Panel: No results for input(s): VITAMINB12, FOLATE, FERRITIN, TIBC, IRON, RETICCTPCT in the last 72 hours. Urine analysis:    Component Value Date/Time   COLORURINE AMBER* 10/08/2015 1848   APPEARANCEUR CLOUDY* 10/08/2015 1848   LABSPEC 1.029 10/08/2015 1848   PHURINE 6.0 10/08/2015 1848   GLUCOSEU 500* 10/08/2015 1848   HGBUR SMALL* 10/08/2015 1848   BILIRUBINUR SMALL* 10/08/2015 1848   KETONESUR NEGATIVE 10/08/2015 1848   PROTEINUR NEGATIVE 10/08/2015 1848   NITRITE NEGATIVE 10/08/2015 1848   LEUKOCYTESUR NEGATIVE 10/08/2015 1848   Sepsis Labs: Invalid input(s): PROCALCITONIN, LACTICIDVEN  Recent Results (from the past 240 hour(s))  MRSA PCR Screening     Status: None   Collection Time: 10/09/15  1:20 AM  Result Value Ref Range Status   MRSA by PCR NEGATIVE NEGATIVE Final    Comment:        The GeneXpert MRSA Assay (FDA approved for NASAL specimens only), is one component of a comprehensive MRSA colonization surveillance program. It is not intended to diagnose MRSA infection nor to guide or monitor treatment for MRSA infections.       Radiology Studies: Dg Chest 2 View  10/08/2015  CLINICAL DATA:  50 year old with current history of COPD presenting with 2 day  history of shortness of breath and productive cough. EXAM: CHEST  2 VIEW COMPARISON:  None. FINDINGS: Cardiac silhouette normal in size. Thoracic aorta mildly atherosclerotic and tortuous. Hilar and mediastinal contours otherwise unremarkable. Prominent bronchovascular markings diffusely and moderate central peribronchial thickening. Lungs otherwise clear. No localized airspace consolidation. No pleural effusions. No pneumothorax. Normal pulmonary vascularity. Thoracolumbar dextroscoliosis and degenerative changes throughout the thoracic spine. IMPRESSION: Moderate changes of bronchitis and/or asthma without focal airspace pneumonia. Electronically Signed   By: Evangeline Dakin M.D.   On: 10/08/2015 17:44   Dg Wrist Complete Left  10/08/2015  CLINICAL DATA:  50 year old who fell and injured the left wrist. Medial pain. Initial encounter. EXAM: LEFT WRIST - COMPLETE 3+ VIEW COMPARISON:  None. FINDINGS: No evidence of acute fracture or dislocation. Remote healed fracture involving the distal ulnar metaphysis. Prior ORIF of a fracture involving the base of the 1st metacarpal with normal healing. Mild radiocarpal and mild intercarpal joint space narrowing. Moderate trapezium-1st metacarpal joint space narrowing. IMPRESSION: 1. No acute osseous abnormality. 2. Osteoarthritis. 3. Remote healed fracture involving the distal ulnar metaphysis. Prior ORIF of a fracture involving the base of the 1st metacarpal with normal healing. Electronically Signed   By: Evangeline Dakin M.D.   On: 10/08/2015 17:46     Scheduled Meds: . aspirin  325 mg Oral Daily  . budesonide (PULMICORT) nebulizer solution  0.25 mg Nebulization BID  . dextromethorphan-guaiFENesin  1 tablet Oral BID  . enoxaparin (LOVENOX) injection  40 mg Subcutaneous Q24H  . ipratropium-albuterol  3 mL Nebulization Once  . ipratropium-albuterol  3 mL Nebulization TID  . levofloxacin (LEVAQUIN) IV  750 mg Intravenous Q24H  . methylPREDNISolone (SOLU-MEDROL)  injection  60 mg Intravenous Q12H  . nitroGLYCERIN  1 inch Topical Q6H  . sodium chloride flush  3 mL Intravenous Q12H   Continuous Infusions: . sodium chloride 75 mL/hr at 10/09/15 0139     Marzetta Board, MD, PhD [facility] Pager [phone]  If 7PM-7AM, please contact night-coverage [email or web address] Password [facility] 10/09/2015, 12:37 PM

## 2015-10-10 NOTE — Progress Notes (Signed)
Pt requesting for more soda drink, pt was already given 3 cans of ginger ale since 7pm and day RN reported that pt already consumed more than 10 cans of soda, pt was advised that we will be limiting his intake since his having shortness of breath, pt also complained that he asked to have a shower all day today but was told that he'll have shower later, offered patient to have his shower tonight but patient declined and verbalized that he's tired and wants to sleep.

## 2015-10-10 NOTE — Clinical Social Work Note (Signed)
Clinical Social Work Assessment  Patient Details  Name: [name] MRN: [id number] Date of Birth: 07/13/1965  Date of referral:  10/08/15               Reason for consult:  Housing Concerns/Homelessness, Substance Use/ETOH Abuse                Permission sought to share information with:    Permission granted to share information::  No  Name::        Agency::     Relationship::     Contact Information:     Housing/Transportation Living arrangements for the past 2 months:  Homeless Source of Information:  Patient, Medical Team Patient Interpreter Needed:  None Criminal Activity/Legal Involvement Pertinent to Current Situation/Hospitalization:  No - Comment as needed Significant Relationships:  None Lives with:  Self Do you feel safe going back to the place where you live?  No Need for family participation in patient care:  No (Coment)  Care giving concerns:  Patient is homeless with no supports. Current cocaine use.   Social Worker assessment / plan:  CSW met with patient. No supports at bedside. CSW introduced role and explained that discharge planning would be discussed. Patient does not have a plan of where to go following discharge. CSW provided shelter list in [place] area. CSW asked if patient was interested in substance abuse or mental health resources. Patient declined. No further concerns. CSW encouraged patient to contact CSW as needed. CSW will continue to follow patient for support and if any other social work needs arise.  Employment status:  Unemployed Insurance information:  Self Pay (Medicaid Pending) PT Recommendations:  Not assessed at this time Information / Referral to community resources:  Shelter  Patient/Family's Response to care:  Patient agreeable to shelter resources. Declined substance abuse or mental health resources. Patient does not have supports. Patient polite and appreciated social work intervention.  Patient/Family's Understanding  of and Emotional Response to Diagnosis, Current Treatment, and Prognosis:  CSW understanding of medical interventions. Patient aware that CSW provided shelter resources for discharge.  Emotional Assessment Appearance:  Appears stated age Attitude/Demeanor/Rapport:  Other (Irritable) Affect (typically observed):  Agitated, Frustrated Orientation:  Oriented to Self, Oriented to Place, Oriented to  Time, Oriented to Situation Alcohol / Substance use:  Illicit Drugs Psych involvement (Current and /or in the community):  No (Comment)  Discharge Needs  Concerns to be addressed:  Patient refuses services, Substance Abuse Concerns, Homelessness Readmission within the last 30 days:  No Current discharge risk:  Homeless, Lack of support system, Substance Abuse Barriers to Discharge:  Active Substance Use, Homeless with medical needs, Inadequate or no insurance    C , LCSW 10/10/2015, 12:24 PM  

## 2015-10-10 NOTE — Progress Notes (Signed)
PROGRESS NOTE  [name] [id number] DOB: 07/13/1965 DOA: 10/08/2015 PCP: No PCP Per Patient   LOS: 1 day   Brief Narrative: 50 year old homeless male with history of tobacco abuse, admitted on 5/20 for progressive shortness of breath and cough for the past couple weeks and increasing sputum production. He also reports fevers. He was admitted for COPD exacerbation.  Assessment & Plan: Principal Problem:   Syncope and collapse Active Problems:   SOB (shortness of breath)   Chest pain   COPD (chronic obstructive pulmonary disease) (HCC)   Acute bronchitis   Cocaine abuse   COPD exacerbation due to CAP - without much improvement today - Patient with wheezing still, continue IV steroids, Pulmicort, scheduled DuoNeb's - Continue levofloxacin  Chest pain - Likely in the setting of severe cough, does not report typical chest pain, troponins mildly elevated 0.1 >> 0.06 >> 0.07, flat, likely demand ischemia in the setting of COPD exacerbation. Discontinue nitro paste. - echo with normal EF, grade 1 dd, PAP 49 - eventually needs a PCP - needs to quit cocaine and tobacco  Polysubstance abuse - Patient only claims tobacco use however his UDS was positive for cocaine - It appears that he is minimizing his alcohol use, stating that he drinks every other weekend - counseled for cessation today  Homelessness - Social worker consult    DVT prophylaxis: Lovenox  Code Status: Full code  Family Communication: No family at bedside  Disposition Plan: Shelter when ready 1-2 days  Consultants:   None  Procedures:   2D echo  Antimicrobials:  Levaquin 5/20 >>   Subjective: - appreciates no improvement in his breathing  Objective: Filed Vitals:   10/09/15 2017 10/09/15 2356 10/10/15 0547 10/10/15 0556  BP:  154/91 141/80   Pulse:  60 55   Temp:  98.2 F (36.8 C)    TempSrc:  Oral Oral   Resp:  22 20   Height:      Weight:    101.243 kg (223 lb 3.2 oz)  SpO2:  92% 98%      Intake/Output Summary (Last 24 hours) at 10/10/15 1331 Last data filed at 10/10/15 1201  Gross per 24 hour  Intake   3285 ml  Output   1750 ml  Net   1535 ml   Filed Weights   10/08/15 1623 10/09/15 0101 10/10/15 0556  Weight: 99.791 kg (220 lb) 99.02 kg (218 lb 4.8 oz) 101.243 kg (223 lb 3.2 oz)    Examination: Constitutional: Flushed face, NAD Filed Vitals:   10/09/15 2017 10/09/15 2356 10/10/15 0547 10/10/15 0556  BP:  154/91 141/80   Pulse:  60 55   Temp:  98.2 F (36.8 C)    TempSrc:  Oral Oral   Resp:  22 20   Height:      Weight:    101.243 kg (223 lb 3.2 oz)  SpO2: 92% 98%     ENMT: Mucous membranes are moist. Respiratory: Bilateral wheezing, improved. Diminished breath sounds  Cardiovascular: Regular rate and rhythm, no murmurs / rubs / gallops. No LE edema. Abdomen: no tenderness. Bowel sounds positive.  Musculoskeletal: no clubbing / cyanosis. + joint deformity thumb on left No contractures. Normal muscle tone.  Skin: no rashes, lesions, ulcers. No induration. Tattoos present  Psychiatric: Normal judgment and insight. Alert and oriented x 3. Appears anxious   Data Reviewed: I have personally reviewed following labs and imaging studies  CBC:  Recent Labs Lab 10/08/15 1703 10/09/15 0211  WBC  8.3 9.4  NEUTROABS 6.1  --   HGB 15.6 14.3  HCT 45.2 41.9  MCV 103.2* 103.7*  PLT 198 [id number]   Basic Metabolic Panel:  Recent Labs Lab 10/08/15 1703 10/09/15 0211  NA 138 135  K 3.7 3.9  CL 100* 100*  CO2 30 28  GLUCOSE 174* 139*  BUN 7 6  CREATININE 0.97 0.91  CALCIUM 9.1 8.3*   GFR: Estimated Creatinine Clearance: 110.7 mL/min (by C-G formula based on Cr of 0.91). Liver Function Tests:  Recent Labs Lab 10/08/15 1703  AST 39  ALT 59  ALKPHOS 60  BILITOT 1.2  PROT 7.5  ALBUMIN 3.2*   No results for input(s): LIPASE, AMYLASE in the last 168 hours. No results for input(s): AMMONIA in the last 168 hours. Coagulation  Profile:  Recent Labs Lab 10/08/15 1703  INR 1.12   Cardiac Enzymes:  Recent Labs Lab 10/08/15 2230 10/09/15 0211 10/09/15 0712 10/09/15 1327  TROPONINI 0.10* 0.06* 0.07* 0.04*   BNP (last 3 results) No results for input(s): PROBNP in the last 8760 hours. HbA1C: No results for input(s): HGBA1C in the last 72 hours. CBG: No results for input(s): GLUCAP in the last 168 hours. Lipid Profile:  Recent Labs  10/09/15 0211  CHOL 103  HDL 28*  LDLCALC 66  TRIG 45  CHOLHDL 3.7   Thyroid Function Tests: No results for input(s): TSH, T4TOTAL, FREET4, T3FREE, THYROIDAB in the last 72 hours. Anemia Panel: No results for input(s): VITAMINB12, FOLATE, FERRITIN, TIBC, IRON, RETICCTPCT in the last 72 hours. Urine analysis:    Component Value Date/Time   COLORURINE AMBER* 10/08/2015 1848   APPEARANCEUR CLOUDY* 10/08/2015 1848   LABSPEC 1.029 10/08/2015 1848   PHURINE 6.0 10/08/2015 1848   GLUCOSEU 500* 10/08/2015 1848   HGBUR SMALL* 10/08/2015 1848   BILIRUBINUR SMALL* 10/08/2015 1848   KETONESUR NEGATIVE 10/08/2015 1848   PROTEINUR NEGATIVE 10/08/2015 1848   NITRITE NEGATIVE 10/08/2015 1848   LEUKOCYTESUR NEGATIVE 10/08/2015 1848   Sepsis Labs: Invalid input(s): PROCALCITONIN, LACTICIDVEN  Recent Results (from the past 240 hour(s))  MRSA PCR Screening     Status: None   Collection Time: 10/09/15  1:20 AM  Result Value Ref Range Status   MRSA by PCR NEGATIVE NEGATIVE Final    Comment:        The GeneXpert MRSA Assay (FDA approved for NASAL specimens only), is one component of a comprehensive MRSA colonization surveillance program. It is not intended to diagnose MRSA infection nor to guide or monitor treatment for MRSA infections.       Radiology Studies: Dg Chest 2 View  10/08/2015  CLINICAL DATA:  50 year old with current history of COPD presenting with 2 day history of shortness of breath and productive cough. EXAM: CHEST  2 VIEW COMPARISON:  None.  FINDINGS: Cardiac silhouette normal in size. Thoracic aorta mildly atherosclerotic and tortuous. Hilar and mediastinal contours otherwise unremarkable. Prominent bronchovascular markings diffusely and moderate central peribronchial thickening. Lungs otherwise clear. No localized airspace consolidation. No pleural effusions. No pneumothorax. Normal pulmonary vascularity. Thoracolumbar dextroscoliosis and degenerative changes throughout the thoracic spine. IMPRESSION: Moderate changes of bronchitis and/or asthma without focal airspace pneumonia. Electronically Signed   By: Evangeline Dakin M.D.   On: 10/08/2015 17:44   Dg Wrist Complete Left  10/08/2015  CLINICAL DATA:  50 year old who fell and injured the left wrist. Medial pain. Initial encounter. EXAM: LEFT WRIST - COMPLETE 3+ VIEW COMPARISON:  None. FINDINGS: No evidence of acute fracture or dislocation. Remote  healed fracture involving the distal ulnar metaphysis. Prior ORIF of a fracture involving the base of the 1st metacarpal with normal healing. Mild radiocarpal and mild intercarpal joint space narrowing. Moderate trapezium-1st metacarpal joint space narrowing. IMPRESSION: 1. No acute osseous abnormality. 2. Osteoarthritis. 3. Remote healed fracture involving the distal ulnar metaphysis. Prior ORIF of a fracture involving the base of the 1st metacarpal with normal healing. Electronically Signed   By: Evangeline Dakin M.D.   On: 10/08/2015 17:46     Scheduled Meds: . aspirin  325 mg Oral Daily  . budesonide (PULMICORT) nebulizer solution  0.25 mg Nebulization BID  . dextromethorphan-guaiFENesin  1 tablet Oral BID  . enoxaparin (LOVENOX) injection  40 mg Subcutaneous Q24H  . ipratropium-albuterol  3 mL Nebulization BID  . levofloxacin (LEVAQUIN) IV  750 mg Intravenous Q24H  . methylPREDNISolone (SOLU-MEDROL) injection  60 mg Intravenous Q12H  . sodium chloride flush  3 mL Intravenous Q12H   Continuous Infusions: . sodium chloride 75 mL/hr at  10/10/15 [id number]     Marzetta Board, MD, PhD [facility] Pager [phone]  If 7PM-7AM, please contact night-coverage [email or web address] Password [email or web address] 10/10/2015, 1:31 PM

## 2015-10-11 LAB — CBC
HEMATOCRIT: 43 % (ref 39.0–52.0)
Hemoglobin: 14.6 g/dL (ref 13.0–17.0)
MCH: 35.6 pg — AB (ref 26.0–34.0)
MCHC: 34 g/dL (ref 30.0–36.0)
MCV: 104.9 fL — AB (ref 78.0–100.0)
Platelets: 242 10*3/uL (ref 150–400)
RBC: 4.1 MIL/uL — ABNORMAL LOW (ref 4.22–5.81)
RDW: 13.1 % (ref 11.5–15.5)
WBC: 11.3 10*3/uL — AB (ref 4.0–10.5)

## 2015-10-11 LAB — BASIC METABOLIC PANEL
Anion gap: 6 (ref 5–15)
BUN: 11 mg/dL (ref 6–20)
CHLORIDE: 97 mmol/L — AB (ref 101–111)
CO2: 34 mmol/L — AB (ref 22–32)
CREATININE: 0.73 mg/dL (ref 0.61–1.24)
Calcium: 8.5 mg/dL — ABNORMAL LOW (ref 8.9–10.3)
GFR calc Af Amer: >60 mL/min (ref >60)
GFR calc non Af Amer: >60 mL/min (ref >60)
GLUCOSE: 153 mg/dL — AB (ref 65–99)
POTASSIUM: 4 mmol/L (ref 3.5–5.1)
Sodium: 137 mmol/L (ref 135–145)

## 2015-10-11 MED ORDER — ALBUTEROL SULFATE HFA 108 (90 BASE) MCG/ACT IN AERS: 2.0000 | INHALATION_SPRAY | Freq: Four times a day (QID) | RESPIRATORY_TRACT | Status: DC | PRN

## 2015-10-11 MED ORDER — GUAIFENESIN-DM 100-10 MG/5ML PO SYRP: 5.0000 mL | ORAL_SOLUTION | ORAL | Status: DC | PRN

## 2015-10-11 MED ORDER — LEVOFLOXACIN 750 MG PO TABS: 750.0000 mg | ORAL_TABLET | Freq: Every day | ORAL | Status: DC

## 2015-10-11 MED ORDER — NICOTINE 21 MG/24HR TD PT24: 21.0000 mg | MEDICATED_PATCH | Freq: Every day | TRANSDERMAL | Status: DC

## 2015-10-11 MED ORDER — PREDNISONE 20 MG PO TABS: 40.0000 mg | ORAL_TABLET | Freq: Every day | ORAL | Status: DC

## 2015-10-11 NOTE — Discharge Summary (Signed)
Physician Discharge Summary  [name] [id number] DOB: 07/13/1965 DOA: 10/08/2015  PCP: No PCP Per Patient  Admit date: 10/08/2015 Discharge date: 10/11/2015  Time spent: > 30 minutes  Recommendations for Outpatient Follow-up:  1. Follow up with [facility] tomorrow at noon as below  Discharge Diagnoses:  Principal Problem:   Syncope and collapse Active Problems:   SOB (shortness of breath)   Chest pain   COPD (chronic obstructive pulmonary disease) (HCC)   Acute bronchitis   Cocaine abuse   Discharge Condition: stable  Diet recommendation: regular  Filed Weights   10/09/15 0101 10/10/15 0556 10/11/15 0443  Weight: 99.02 kg (218 lb 4.8 oz) 101.243 kg (223 lb 3.2 oz) 101.107 kg (222 lb 14.4 oz)    History of present illness:  See H&P, Labs, Consult and Test reports for all details in brief, patient is a 50 year old homeless male with history of tobacco abuse, admitted on 5/20 for progressive shortness of breath and cough for the past couple weeks and increasing sputum production. He also reports fevers. He was admitted for COPD exacerbation.  Hospital Course:  COPD exacerbation due to probable CAP - patient with wheezing on admission, started on nebs, antibiotics and steroids. He improved significantly and his wheezing has completely resolved, able to ambulate on room air without significant shortness of breath. He was transitioned to po Levaquin, and will complete 4 additional days, as well as a prednisone taper. He was prescribed an inhaler as well. Chest pain - Likely in the setting of severe cough, does not report typical chest pain, troponins mildly elevated 0.1 >> 0.06 >> 0.07, flat, likely demand ischemia in the setting of COPD exacerbation. Echo with normal EF, grade 1 dd, PAP 49. Eventually needs a PCP.  Polysubstance abuse - Patient only claims tobacco use however his UDS was positive for cocaine, declined SW help with substance abuse.  Counseled extensively, nicotine patch prescribed on discharge Homelessness - Social worker consult  Procedures:  2D echo   Consultations:  none  Discharge Exam: Filed Vitals:   10/10/15 1522 10/10/15 2106 10/11/15 0443 10/11/15 0602  BP: 148/82 150/92 184/109 166/88  Pulse: 56 58 52 55  Temp: 97.7 F (36.5 C) 97.9 F (36.6 C) 98.1 F (36.7 C)   TempSrc: Oral Oral Oral   Resp: 18 20 21    Height:      Weight:   101.107 kg (222 lb 14.4 oz)   SpO2: 95% 94% 99%     General: NAD Cardiovascular: RRR Respiratory: CTA biL, no wheezing  Discharge Instructions Activity:  As tolerated   Get Medicines reviewed and adjusted: Please take all your medications with you for your next visit with your Primary MD  Please request your Primary MD to go over all hospital tests and procedure/radiological results at the follow up, please ask your Primary MD to get all Hospital records sent to his/her office.  If you experience worsening of your admission symptoms, develop shortness of breath, life threatening emergency, suicidal or homicidal thoughts you must seek medical attention immediately by calling 911 or calling your MD immediately if symptoms less severe.  You must read complete instructions/literature along with all the possible adverse reactions/side effects for all the Medicines you take and that have been prescribed to you. Take any new Medicines after you have completely understood and accpet all the possible adverse reactions/side effects.   Do not drive when taking Pain medications.   Do not take more than prescribed Pain, Sleep  and Anxiety Medications  Special Instructions: If you have smoked or chewed Tobacco in the last 2 yrs please stop smoking, stop any regular Alcohol and or any Recreational drug use.  Wear Seat belts while driving.  Please note  You were cared for by a hospitalist during your hospital stay. Once you are discharged, your primary care physician  will handle any further medical issues. Please note that NO REFILLS for any discharge medications will be authorized once you are discharged, as it is imperative that you return to your primary care physician (or establish a relationship with a primary care physician if you do not have one) for your aftercare needs so that they can reassess your need for medications and monitor your lab values.    Medication List    TAKE these medications        albuterol 108 (90 Base) MCG/ACT inhaler  Commonly known as:  PROVENTIL HFA;VENTOLIN HFA  Inhale 2 puffs into the lungs every 6 (six) hours as needed for wheezing or shortness of breath.     guaiFENesin-dextromethorphan 100-10 MG/5ML syrup  Commonly known as:  ROBITUSSIN DM  Take 5 mLs by mouth every 4 (four) hours as needed for cough.     levofloxacin 750 MG tablet  Commonly known as:  LEVAQUIN  Take 1 tablet (750 mg total) by mouth daily.     nicotine 21 mg/24hr patch  Commonly known as:  EQ NICOTINE  Place 1 patch (21 mg total) onto the skin daily.     predniSONE 20 MG tablet  Commonly known as:  DELTASONE  Take 2 tablets (40 mg total) by mouth daily with breakfast. 40 mg x 3 days then 20 mg x 3 days           Follow-up Information    Follow up with [facility] On 10/12/2015.   Why:  12:00 Noon   Contact information:   [address] [place] [phone]      The results of significant diagnostics from this hospitalization (including imaging, microbiology, ancillary and laboratory) are listed below for reference.    Significant Diagnostic Studies: Dg Chest 2 View  10/08/2015  CLINICAL DATA:  50 year old with current history of COPD presenting with 2 day history of shortness of breath and productive cough. EXAM: CHEST  2 VIEW COMPARISON:  None. FINDINGS: Cardiac silhouette normal in size. Thoracic aorta mildly atherosclerotic and tortuous. Hilar and mediastinal  contours otherwise unremarkable. Prominent bronchovascular markings diffusely and moderate central peribronchial thickening. Lungs otherwise clear. No localized airspace consolidation. No pleural effusions. No pneumothorax. Normal pulmonary vascularity. Thoracolumbar dextroscoliosis and degenerative changes throughout the thoracic spine. IMPRESSION: Moderate changes of bronchitis and/or asthma without focal airspace pneumonia. Electronically Signed   By: Evangeline Dakin M.D.   On: 10/08/2015 17:44   Dg Wrist Complete Left  10/08/2015  CLINICAL DATA:  50 year old who fell and injured the left wrist. Medial pain. Initial encounter. EXAM: LEFT WRIST - COMPLETE 3+ VIEW COMPARISON:  None. FINDINGS: No evidence of acute fracture or dislocation. Remote healed fracture involving the distal ulnar metaphysis. Prior ORIF of a fracture involving the base of the 1st metacarpal with normal healing. Mild radiocarpal and mild intercarpal joint space narrowing. Moderate trapezium-1st metacarpal joint space narrowing. IMPRESSION: 1. No acute osseous abnormality. 2. Osteoarthritis. 3. Remote healed fracture involving the distal ulnar metaphysis. Prior ORIF of a fracture involving the base of the 1st metacarpal with normal healing. Electronically Signed  By: Evangeline Dakin M.D.   On: 10/08/2015 17:46    Microbiology: Recent Results (from the past 240 hour(s))  MRSA PCR Screening     Status: None   Collection Time: 10/09/15  1:20 AM  Result Value Ref Range Status   MRSA by PCR NEGATIVE NEGATIVE Final    Comment:        The GeneXpert MRSA Assay (FDA approved for NASAL specimens only), is one component of a comprehensive MRSA colonization surveillance program. It is not intended to diagnose MRSA infection nor to guide or monitor treatment for MRSA infections.      Labs: Basic Metabolic Panel:  Recent Labs Lab 10/08/15 1703 10/09/15 0211 10/11/15 0200  NA 138 135 137  K 3.7 3.9 4.0  CL 100* 100* 97*    CO2 30 28 34*  GLUCOSE 174* 139* 153*  BUN 7 6 11   CREATININE 0.97 0.91 0.73  CALCIUM 9.1 8.3* 8.5*   Liver Function Tests:  Recent Labs Lab 10/08/15 1703  AST 39  ALT 59  ALKPHOS 60  BILITOT 1.2  PROT 7.5  ALBUMIN 3.2*   No results for input(s): LIPASE, AMYLASE in the last 168 hours. No results for input(s): AMMONIA in the last 168 hours. CBC:  Recent Labs Lab 10/08/15 1703 10/09/15 0211 10/11/15 0200  WBC 8.3 9.4 11.3*  NEUTROABS 6.1  --   --   HGB 15.6 14.3 14.6  HCT 45.2 41.9 43.0  MCV 103.2* 103.7* 104.9*  PLT 198 183 242   Cardiac Enzymes:  Recent Labs Lab 10/08/15 2230 10/09/15 0211 10/09/15 0712 10/09/15 1327  TROPONINI 0.10* 0.06* 0.07* 0.04*   BNP: BNP (last 3 results) No results for input(s): BNP in the last 8760 hours.  ProBNP (last 3 results) No results for input(s): PROBNP in the last 8760 hours.  CBG: No results for input(s): GLUCAP in the last 168 hours.     SignedMarzetta Board  [facility] 10/11/2015, 1:32 PM

## 2015-10-11 NOTE — Progress Notes (Signed)
Pt has orders to be discharged. Discharge instructions given and pt has no additional questions at this time. Medication regimen reviewed and pt educated. Pt verbalized understanding and has no additional questions. Telemetry box removed. IV removed and site in good condition. Pt stable and waiting for transportation.   Aryahi Denzler RN 

## 2015-10-11 NOTE — Discharge Instructions (Signed)
Follow with [facility] in 5-7 days  Please get a complete blood count and chemistry panel checked by your Primary MD at your next visit, and again as instructed by your Primary MD. Please get your medications reviewed and adjusted by your Primary MD.  Please request your Primary MD to go over all Hospital Tests and Procedure/Radiological results at the follow up, please get all Hospital records sent to your Prim MD by signing hospital release before you go home.  If you had Pneumonia of Lung problems at the Hospital: Please get a 2 view Chest X ray done in 6-8 weeks after hospital discharge or sooner if instructed by your Primary MD.  If you have Congestive Heart Failure: Please call your Cardiologist or Primary MD anytime you have any of the following symptoms:  1) 3 pound weight gain in 24 hours or 5 pounds in 1 week  2) shortness of breath, with or without a dry hacking cough  3) swelling in the hands, feet or stomach  4) if you have to sleep on extra pillows at night in order to breathe  Follow cardiac low salt diet and 1.5 lit/day fluid restriction.  If you have diabetes Accuchecks 4 times/day, Once in AM empty stomach and then before each meal. Log in all results and show them to your primary doctor at your next visit. If any glucose reading is under 80 or above 300 call your primary MD immediately.  If you have Seizure/Convulsions/Epilepsy: Please do not drive, operate heavy machinery, participate in activities at heights or participate in high speed sports until you have seen by Primary MD or a Neurologist and advised to do so again.  If you had Gastrointestinal Bleeding: Please ask your Primary MD to check a complete blood count within one week of discharge or at your next visit. Your endoscopic/colonoscopic biopsies that are pending at the time of discharge, will also need to followed by your Primary MD.  Get Medicines reviewed and adjusted. Please take all  your medications with you for your next visit with your Primary MD  Please request your Primary MD to go over all hospital tests and procedure/radiological results at the follow up, please ask your Primary MD to get all Hospital records sent to his/her office.  If you experience worsening of your admission symptoms, develop shortness of breath, life threatening emergency, suicidal or homicidal thoughts you must seek medical attention immediately by calling 911 or calling your MD immediately  if symptoms less severe.  You must read complete instructions/literature along with all the possible adverse reactions/side effects for all the Medicines you take and that have been prescribed to you. Take any new Medicines after you have completely understood and accpet all the possible adverse reactions/side effects.   Do not drive or operate heavy machinery when taking Pain medications.   Do not take more than prescribed Pain, Sleep and Anxiety Medications  Special Instructions: If you have smoked or chewed Tobacco  in the last 2 yrs please stop smoking, stop any regular Alcohol  and or any Recreational drug use.  Wear Seat belts while driving.  Please note You were cared for by a hospitalist during your hospital stay. If you have any questions about your discharge medications or the care you received while you were in the hospital after you are discharged, you can call the unit and asked to speak with the hospitalist on call if the hospitalist that took care of you is not available.  Once you are discharged, your primary care physician will handle any further medical issues. Please note that NO REFILLS for any discharge medications will be authorized once you are discharged, as it is imperative that you return to your primary care physician (or establish a relationship with a primary care physician if you do not have one) for your aftercare needs so that they can reassess your need for medications and monitor  your lab values.  You can reach the hospitalist office at phone [phone] or fax [phone]   If you do not have a primary care physician, you can call [phone] for a physician referral.  Activity: As tolerated with Full fall precautions use walker/cane & assistance as needed  Diet: regular  Disposition Home

## 2015-10-11 NOTE — Progress Notes (Signed)
Patient talked to patient about DCP; No PCP, No insurance, homeless issues. Patient stated that he does not want to go to a shelter at discharge and is planning to stay with friends. Patient is not interested in going to the [facility] at discharge. CM informed patient that I would help him get his medication at discharge and to wait until I return to his room. Patient very upset about being discharged. Patient left the hospital before I could give him his medication. Mindi Slicker [facility] [phone]

## 2015-10-12 ENCOUNTER — Inpatient Hospital Stay (HOSPITAL_COMMUNITY)
Admission: EM | Admit: 2015-10-12 | Discharge: 2015-10-15 | DRG: 192 | Discharge status: Against Medical Advice | Payer: Self-pay | Attending: Internal Medicine | Admitting: Internal Medicine

## 2015-10-12 ENCOUNTER — Inpatient Hospital Stay: Payer: Self-pay | Admitting: Internal Medicine

## 2015-10-12 ENCOUNTER — Encounter (HOSPITAL_COMMUNITY): Payer: Self-pay | Admitting: Emergency Medicine

## 2015-10-12 ENCOUNTER — Emergency Department (HOSPITAL_COMMUNITY): Payer: Self-pay

## 2015-10-12 DIAGNOSIS — F17210 Nicotine dependence, cigarettes, uncomplicated: Secondary | ICD-10-CM | POA: Diagnosis present

## 2015-10-12 DIAGNOSIS — R748 Abnormal levels of other serum enzymes: Secondary | ICD-10-CM | POA: Diagnosis present

## 2015-10-12 DIAGNOSIS — R001 Bradycardia, unspecified: Secondary | ICD-10-CM | POA: Diagnosis present

## 2015-10-12 DIAGNOSIS — J209 Acute bronchitis, unspecified: Secondary | ICD-10-CM | POA: Diagnosis present

## 2015-10-12 DIAGNOSIS — R059 Cough, unspecified: Secondary | ICD-10-CM

## 2015-10-12 DIAGNOSIS — F1910 Other psychoactive substance abuse, uncomplicated: Secondary | ICD-10-CM | POA: Diagnosis present

## 2015-10-12 DIAGNOSIS — R05 Cough: Secondary | ICD-10-CM

## 2015-10-12 DIAGNOSIS — R0602 Shortness of breath: Secondary | ICD-10-CM | POA: Diagnosis present

## 2015-10-12 DIAGNOSIS — Z8249 Family history of ischemic heart disease and other diseases of the circulatory system: Secondary | ICD-10-CM

## 2015-10-12 DIAGNOSIS — R778 Other specified abnormalities of plasma proteins: Secondary | ICD-10-CM | POA: Diagnosis present

## 2015-10-12 DIAGNOSIS — R7989 Other specified abnormal findings of blood chemistry: Secondary | ICD-10-CM

## 2015-10-12 DIAGNOSIS — E669 Obesity, unspecified: Secondary | ICD-10-CM | POA: Diagnosis present

## 2015-10-12 DIAGNOSIS — R079 Chest pain, unspecified: Secondary | ICD-10-CM

## 2015-10-12 DIAGNOSIS — R740 Nonspecific elevation of levels of transaminase and lactic acid dehydrogenase [LDH]: Secondary | ICD-10-CM | POA: Diagnosis present

## 2015-10-12 DIAGNOSIS — Z823 Family history of stroke: Secondary | ICD-10-CM

## 2015-10-12 DIAGNOSIS — R0600 Dyspnea, unspecified: Secondary | ICD-10-CM

## 2015-10-12 DIAGNOSIS — I272 Other secondary pulmonary hypertension: Secondary | ICD-10-CM | POA: Diagnosis present

## 2015-10-12 DIAGNOSIS — J440 Chronic obstructive pulmonary disease with acute lower respiratory infection: Principal | ICD-10-CM | POA: Diagnosis present

## 2015-10-12 DIAGNOSIS — R609 Edema, unspecified: Secondary | ICD-10-CM

## 2015-10-12 DIAGNOSIS — F1410 Cocaine abuse, uncomplicated: Secondary | ICD-10-CM | POA: Diagnosis present

## 2015-10-12 DIAGNOSIS — Z880 Allergy status to penicillin: Secondary | ICD-10-CM

## 2015-10-12 DIAGNOSIS — Z6831 Body mass index (BMI) 31.0-31.9, adult: Secondary | ICD-10-CM

## 2015-10-12 DIAGNOSIS — R0789 Other chest pain: Secondary | ICD-10-CM | POA: Diagnosis present

## 2015-10-12 DIAGNOSIS — F410 Panic disorder [episodic paroxysmal anxiety] without agoraphobia: Secondary | ICD-10-CM | POA: Diagnosis present

## 2015-10-12 HISTORY — DX: Tobacco use: Z72.0

## 2015-10-12 LAB — BASIC METABOLIC PANEL
Anion gap: 5 (ref 5–15)
BUN: 17 mg/dL (ref 6–20)
CALCIUM: 8.5 mg/dL — AB (ref 8.9–10.3)
CO2: 32 mmol/L (ref 22–32)
CREATININE: 0.92 mg/dL (ref 0.61–1.24)
Chloride: 101 mmol/L (ref 101–111)
GFR calc Af Amer: >60 mL/min (ref >60)
GLUCOSE: 121 mg/dL — AB (ref 65–99)
POTASSIUM: 3.5 mmol/L (ref 3.5–5.1)
SODIUM: 138 mmol/L (ref 135–145)

## 2015-10-12 LAB — CBC WITH DIFFERENTIAL/PLATELET
BASOS PCT: 0 %
Basophils Absolute: 0 10*3/uL (ref 0.0–0.1)
EOS ABS: 0 10*3/uL (ref 0.0–0.7)
EOS PCT: 0 %
HEMATOCRIT: 44.1 % (ref 39.0–52.0)
Hemoglobin: 16.2 g/dL (ref 13.0–17.0)
LYMPHS PCT: 35 %
Lymphs Abs: 2.9 10*3/uL (ref 0.7–4.0)
MCH: 37.1 pg — ABNORMAL HIGH (ref 26.0–34.0)
MCHC: 36.7 g/dL — AB (ref 30.0–36.0)
MCV: 100.9 fL — ABNORMAL HIGH (ref 78.0–100.0)
Monocytes Absolute: 0.7 10*3/uL (ref 0.1–1.0)
Monocytes Relative: 9 %
NEUTROS PCT: 56 %
Neutro Abs: 4.6 10*3/uL (ref 1.7–7.7)
Platelets: 256 10*3/uL (ref 150–400)
RBC: 4.37 MIL/uL (ref 4.22–5.81)
RDW: 12.9 % (ref 11.5–15.5)
WBC: 8.2 10*3/uL (ref 4.0–10.5)

## 2015-10-12 LAB — TROPONIN I
TROPONIN I: 0.04 ng/mL — AB (ref <0.031)
Troponin I: 0.04 ng/mL — ABNORMAL HIGH (ref <0.031)

## 2015-10-12 LAB — BRAIN NATRIURETIC PEPTIDE: B NATRIURETIC PEPTIDE 5: 159.2 pg/mL — AB (ref 0.0–100.0)

## 2015-10-12 LAB — TSH: TSH: 1.82 u[IU]/mL (ref 0.350–4.500)

## 2015-10-12 MED ORDER — ASPIRIN EC 325 MG PO TBEC
325.0000 mg | DELAYED_RELEASE_TABLET | Freq: Every day | ORAL | Status: DC
  Administered 2015-10-12 – 2015-10-14 (×3): 325 mg via ORAL
  Filled 2015-10-12 (×3): qty 1

## 2015-10-12 MED ORDER — FOLIC ACID 1 MG PO TABS
1.0000 mg | ORAL_TABLET | Freq: Every day | ORAL | Status: DC
  Administered 2015-10-12 – 2015-10-13 (×2): 1 mg via ORAL
  Filled 2015-10-12 (×2): qty 1

## 2015-10-12 MED ORDER — ATORVASTATIN CALCIUM 40 MG PO TABS
40.0000 mg | ORAL_TABLET | Freq: Every day | ORAL | Status: DC
  Administered 2015-10-12 – 2015-10-14 (×3): 40 mg via ORAL
  Filled 2015-10-12 (×3): qty 1

## 2015-10-12 MED ORDER — SODIUM CHLORIDE 0.9% FLUSH
3.0000 mL | Freq: Two times a day (BID) | INTRAVENOUS | Status: DC
  Administered 2015-10-12 – 2015-10-13 (×3): 3 mL via INTRAVENOUS

## 2015-10-12 MED ORDER — ISOSORBIDE MONONITRATE ER 30 MG PO TB24
30.0000 mg | ORAL_TABLET | Freq: Every day | ORAL | Status: DC
  Administered 2015-10-12 – 2015-10-14 (×3): 30 mg via ORAL
  Filled 2015-10-12 (×3): qty 1

## 2015-10-12 MED ORDER — HYDROMORPHONE HCL 1 MG/ML IJ SOLN
1.0000 mg | INTRAMUSCULAR | Status: DC | PRN
  Administered 2015-10-12 – 2015-10-15 (×11): 1 mg via INTRAVENOUS
  Filled 2015-10-12 (×12): qty 1

## 2015-10-12 MED ORDER — HEPARIN SODIUM (PORCINE) 5000 UNIT/ML IJ SOLN
5000.0000 [IU] | Freq: Three times a day (TID) | INTRAMUSCULAR | Status: DC
  Administered 2015-10-12 – 2015-10-14 (×6): 5000 [IU] via SUBCUTANEOUS
  Filled 2015-10-12 (×6): qty 1

## 2015-10-12 MED ORDER — HYDRALAZINE HCL 20 MG/ML IJ SOLN: 10.0000 mg | Freq: Four times a day (QID) | INTRAMUSCULAR | Status: DC | PRN

## 2015-10-12 NOTE — ED Notes (Signed)
Patient ambulated 52 feet with no assistance. Patient O2 saturation was 96% at the start of the ambulation and stayed at 96%. Patient heart rate was 72 bets per minute at the beginning of the ambulation and 76 bets per minute and the end of the ambulation.

## 2015-10-12 NOTE — ED Notes (Addendum)
[facility] D/C from [facility] yesterday for pneumonia. Pt used a different name at [facility]. C/o SOB and productive cough.

## 2015-10-12 NOTE — Progress Notes (Signed)
CSW was consulted by Admission nurse to speak with patient regarding homelessness.  CSW attempted to meet with pt at bedside. However, patient was not present. CSW will try to meet with pt again later.  Willette Brace [id number] ED CSW 10/12/2015 4:56 PM

## 2015-10-12 NOTE — ED Provider Notes (Signed)
CSN: [id number]     Arrival date & time 10/12/15  1157 History   First MD Initiated Contact with Patient 10/12/15 1232     Chief Complaint  Patient presents with  . Cough  . Shortness of Breath     Recent admission and discharge yesterday under [id number] and the name Tirso Stocks. States he did not use his real name because he had warrants. HPI  patient presents with shortness of breath and cough. States was discharged yesterday and wasn't readily the hospital yet. States he still feels lightheaded and had a cough. States she was in under different name because he has worn so he gave a fake name. Still feeling bad. States he gets lightheaded. Did not fill his medications and did not follow-up at the clinic today as planned. He had a new appointment at [facility] and arrived here at noon also.   Past Medical History  Diagnosis Date  . Panic attacks    Past Surgical History  Procedure Laterality Date  . Shoulder surgery    . Hand surgery     Family History  Problem Relation Age of Onset  . Cancer Other   . Stroke Other   . Coronary artery disease Other    Social History  Substance Use Topics  . Smoking status: Current Every Day Smoker    Types: Cigarettes  . Smokeless tobacco: None  . Alcohol Use: Yes     Comment: heavy- pt denies    Review of Systems  Constitutional: Positive for appetite change.  Respiratory: Positive for cough and shortness of breath.   Cardiovascular: Positive for chest pain.  Gastrointestinal: Negative for nausea, vomiting and abdominal pain.  Genitourinary: Negative for flank pain.  Musculoskeletal: Negative for back pain.  Skin: Negative for wound.  Neurological: Positive for light-headedness.      Allergies  Penicillins  Home Medications   Prior to Admission medications   Medication Sig Start Date End Date Taking? Authorizing Provider  azithromycin (ZITHROMAX Z-PAK) 250 MG tablet 2 po day one, then 1 daily x 4  days Patient not taking: Reported on 10/12/2015 07/22/15   Elnora Morrison, MD   BP 155/100 mmHg  Pulse 57  Temp(Src) 97.8 F (36.6 C) (Oral)  Resp 16  SpO2 100% Physical Exam  Constitutional: He appears well-developed.  HENT:  Face is somewhat flushed.  Cardiovascular: Normal rate.   Pulmonary/Chest: Effort normal.  Mild diffuse wheezes.  Abdominal: Soft.  Musculoskeletal: Normal range of motion.  Neurological: He is alert.  Skin: Skin is warm.    ED Course  Procedures (including critical care time) Labs Review Labs Reviewed  CBC WITH DIFFERENTIAL/PLATELET - Abnormal; Notable for the following:    MCV 100.9 (*)    MCH 37.1 (*)    MCHC 36.7 (*)    All other components within normal limits  TROPONIN I - Abnormal; Notable for the following:    Troponin I 0.04 (*)    All other components within normal limits  BASIC METABOLIC PANEL - Abnormal; Notable for the following:    Glucose, Bld 121 (*)    Calcium 8.5 (*)    All other components within normal limits    Imaging Review Dg Chest 2 View  10/12/2015  CLINICAL DATA:  50 year old male with mid chest pain, shortness of breath and productive cough. Initial encounter. Former smoker. EXAM: CHEST  2 VIEW COMPARISON:  07/22/2015 and earlier. FINDINGS: Stable lung volumes. Stable cardiac size at the upper limits of  normal to mildly enlarged. Other mediastinal contours are within normal limits. Visualized tracheal air column is within normal limits. No pneumothorax or pulmonary edema. No pleural effusion or acute pulmonary opacity. Stable chronic increased interstitial markings. No acute osseous abnormality identified. IMPRESSION: No acute cardiopulmonary abnormality. Electronically Signed   By: Genevie Ann M.D.   On: 10/12/2015 13:05   I have personally reviewed and evaluated these images and lab results as part of my medical decision-making.   EKG Interpretation   Date/Time:  Wednesday Oct 12 2015 14:46:00 EDT Ventricular Rate:  53 PR  Interval:  119 QRS Duration: 107 QT Interval:  466 QTC Calculation: 437 R Axis:   75 Text Interpretation:  Sinus rhythm Atrial premature complexes Borderline  short PR interval RSR' in V1 or V2, right VCD or RVH Probable LVH with  secondary repol abnrm ST and T waves changes since EKGS  done under MRN  [id number] Confirmed by Alvino Chapel  MD, Ovid Curd [phone]) on 10/12/2015 2:55:14  PM      MDM   Final diagnoses:  Cough  Dyspnea    Patient was shortness of breath. Has cough 2. Discharge yesterday under a false name. States he's had more dizziness and still coughing. States cannot do normal activity. Troponin is stable at minimally elevated, but EKG is different than has been over the last few days. To be seen by internal medicine.    Davonna Belling, MD 10/12/15 [phone]

## 2015-10-12 NOTE — H&P (Signed)
Patient Demographics:    [name], is a 50 y.o. male  MRN: [id number]   DOB - [birth year]  Admit Date - 10/12/2015  Outpatient Primary MD for the patient is No primary care provider on file.   Assessment & Plan:    Principal Problem:   SOB (shortness of breath) Active Problems:   Troponin level elevated   Chest pain   Cocaine abuse    1)1)Atypical Chest Pain/SOB-  Cardiovascular risk factors include Male gender, obesity, sedentary lifestyle, hypertension, tobacco use disorder and age over 56 for a male,   Refer to  Observation status on  telemetry monitored unit, check serial troponins and EKG for rule out acute coronary syndrome .  If patient rules out for ACS, we will consider stress in am , if stress test if positive then please get Cardiology consult.  Give aspirin, nitroglycerin, avoid betablocker for now due to cocaine abuse. Echo pending. Discussed with Dr Martinique, official cardiology consult pending. Please see recent hospital records and by different name and medical record number as outlined in history of present illness. Troponins have been slightly elevated but not trending up    2)Polysubstance Abuse- smoking cessation and abstinence from cocaine advised  3)Bronchitis- in a  smoker, clinically and radiologically no evidence of pneumonia at this time  With History of - Reviewed by me  Past Medical History  Diagnosis Date  . Panic attacks       Past Surgical History  Procedure Laterality Date  . Shoulder surgery    . Hand surgery        Chief Complaint  Patient presents with  . Cough  . Shortness of Breath      HPI:    [name]  is a 50 y.o. male, Recent admission and discharge 10/11/15 under [id number] and the name [name]. States he did not use his real  name because he had warrants out for his arrest. He presents with shortness of breath and cough.  He did not get the discharge medications after being discharged on 10/11/2015.  States he still feels lightheaded and had a cough. No fever  Or chills , cough is better, please note the patient is a smoker with recent cocaine use. Patient's story is inconsistent, however he does complain of shortness of breath as well as chest pain, no pleuritic symptoms. No significant leg swelling or leg pains. Patient's description of the chest pain is vague, but does not appear to be exertional. No syncope    Review of systems:    In addition to the HPI above,   A full 12 point Review of Systems was done, except as stated above, all other Review of Systems were negative.    Social History:  Reviewed by me    Social History  Substance Use Topics  . Smoking status: Current Every Day Smoker    Types: Cigarettes  . Smokeless tobacco: Never  Used  . Alcohol Use: Yes     Comment: heavy- pt denies       Family History :  Reviewed by me    Family History  Problem Relation Age of Onset  . Cancer Other   . Stroke Other   . Coronary artery disease Other   . Hypertension Mother       Home Medications:   Prior to Admission medications   Medication Sig Start Date End Date Taking? Authorizing Provider  azithromycin (ZITHROMAX Z-PAK) 250 MG tablet 2 po day one, then 1 daily x 4 days Patient not taking: Reported on 10/12/2015 07/22/15   Randall Morrison, MD     Allergies:     Allergies  Allergen Reactions  . Penicillins Other (See Comments)    Has patient had a PCN reaction causing immediate rash, facial/tongue/throat swelling, SOB or lightheadedness with hypotension: Unknow Has patient had a PCN reaction causing severe rash involving mucus membranes or skin necrosis: Unknown Has patient had a PCN reaction that required hospitalization unknown Has patient had a PCN reaction occurring within the last 10  years: No If all of the above answers are "NO", then may proceed with Cephalosporin use.      Physical Exam:   Vitals  Blood pressure 178/96, pulse 61, temperature 98.1 F (36.7 C), temperature source Oral, resp. rate 18, SpO2 98 %.  Physical Examination: General appearance - alert, well appearing, and in no distress  Mental status - alert, oriented to person, place, and time,  Eyes - sclera anicteric Neck - supple, no JVD elevation , Chest - clear  to auscultation bilaterally, symmetrical air movement,  Heart - S1 and S2 normal,  Abdomen - soft, nontender, nondistended, no masses or organomegaly Neurological - screening mental status exam normal, neck supple without rigidity, cranial nerves II through XII intact, DTR's normal and symmetric Extremities - no pedal edema noted, intact peripheral pulses  Skin - warm, dry    Data Review:    CBC  Recent Labs Lab 10/12/15 1316  WBC 8.2  HGB 16.2  HCT 44.1  PLT 256  MCV 100.9*  MCH 37.1*  MCHC 36.7*  RDW 12.9  LYMPHSABS 2.9  MONOABS 0.7  EOSABS 0.0  BASOSABS 0.0   ------------------------------------------------------------------------------------------------------------------  Chemistries   Recent Labs Lab 10/12/15 1316  NA 138  K 3.5  CL 101  CO2 32  GLUCOSE 121*  BUN 17  CREATININE 0.92  CALCIUM 8.5*   ------------------------------------------------------------------------------------------------------------------ CrCl cannot be calculated (Unknown ideal weight.). ------------------------------------------------------------------------------------------------------------------ No results for input(s): TSH, T4TOTAL, T3FREE, THYROIDAB in the last 72 hours.  Invalid input(s): FREET3   Coagulation profile No results for input(s): INR, PROTIME in the last 168 hours. ------------------------------------------------------------------------------------------------------------------- No results for input(s):  DDIMER in the last 72 hours. -------------------------------------------------------------------------------------------------------------------  Cardiac Enzymes  Recent Labs Lab 10/12/15 1316  TROPONINI 0.04*   ------------------------------------------------------------------------------------------------------------------ No results found for: BNP   ---------------------------------------------------------------------------------------------------------------  Urinalysis    Component Value Date/Time   COLORURINE YELLOW 10/16/2010 0217   APPEARANCEUR CLEAR 10/16/2010 0217   LABSPEC 1.043* 10/16/2010 0217   PHURINE 5.5 10/16/2010 0217   GLUCOSEU NEGATIVE 10/16/2010 0217   HGBUR SMALL* 10/16/2010 0217   BILIRUBINUR SMALL* 10/16/2010 0217   KETONESUR NEGATIVE 10/16/2010 0217   PROTEINUR NEGATIVE 10/16/2010 0217   UROBILINOGEN 1.0 10/16/2010 0217   NITRITE NEGATIVE 10/16/2010 0217   LEUKOCYTESUR NEGATIVE 10/16/2010 0217    ----------------------------------------------------------------------------------------------------------------   Imaging Results:    Dg Chest 2 View  10/12/2015  CLINICAL DATA:  50 year old male with mid chest pain, shortness of breath and productive cough. Initial encounter. Former smoker. EXAM: CHEST  2 VIEW COMPARISON:  07/22/2015 and earlier. FINDINGS: Stable lung volumes. Stable cardiac size at the upper limits of normal to mildly enlarged. Other mediastinal contours are within normal limits. Visualized tracheal air column is within normal limits. No pneumothorax or pulmonary edema. No pleural effusion or acute pulmonary opacity. Stable chronic increased interstitial markings. No acute osseous abnormality identified. IMPRESSION: No acute cardiopulmonary abnormality. Electronically Signed   By: Genevie Ann M.D.   On: 10/12/2015 13:05    Radiological Exams on Admission: Dg Chest 2 View  10/12/2015  CLINICAL DATA:  50 year old male with mid chest pain,  shortness of breath and productive cough. Initial encounter. Former smoker. EXAM: CHEST  2 VIEW COMPARISON:  07/22/2015 and earlier. FINDINGS: Stable lung volumes. Stable cardiac size at the upper limits of normal to mildly enlarged. Other mediastinal contours are within normal limits. Visualized tracheal air column is within normal limits. No pneumothorax or pulmonary edema. No pleural effusion or acute pulmonary opacity. Stable chronic increased interstitial markings. No acute osseous abnormality identified. IMPRESSION: No acute cardiopulmonary abnormality. Electronically Signed   By: Genevie Ann M.D.   On: 10/12/2015 13:05    DVT Prophylaxis Heparin  AM Labs Ordered, also please review Full Orders  Family Communication: Admission, patients condition and plan of care including tests being ordered have been discussed with the patient and who indicate understanding and agree with the plan   Code Status - Full Code  Likely DC to  2 dayd  Condition   Stable,  Jacarra Bobak M.D on 10/12/2015 at 5:23 PM   Between 7am to 7pm - Pager - [phone]  After 7pm go to [email or web address] - password [email or web address]  [facility] - Office  [phone]  Dragon dictation system was used to create this note, attempts have been made to correct errors, however presence of uncorrected errors is not a reflection quality of care provided.

## 2015-10-12 NOTE — Plan of Care (Signed)
Problem: Safety: Goal: Ability to remain free from injury will improve Outcome: Completed/Met Date Met:  10/12/15 Education provided regarding safety precautions. Pt encouraged to call for assist when out of bed

## 2015-10-13 ENCOUNTER — Encounter (HOSPITAL_COMMUNITY): Payer: Self-pay | Admitting: Physician Assistant

## 2015-10-13 ENCOUNTER — Observation Stay (HOSPITAL_COMMUNITY): Payer: Self-pay

## 2015-10-13 ENCOUNTER — Encounter (HOSPITAL_COMMUNITY): Payer: Self-pay

## 2015-10-13 DIAGNOSIS — R0602 Shortness of breath: Secondary | ICD-10-CM

## 2015-10-13 DIAGNOSIS — R079 Chest pain, unspecified: Secondary | ICD-10-CM

## 2015-10-13 DIAGNOSIS — F1410 Cocaine abuse, uncomplicated: Secondary | ICD-10-CM

## 2015-10-13 DIAGNOSIS — R7989 Other specified abnormal findings of blood chemistry: Secondary | ICD-10-CM

## 2015-10-13 DIAGNOSIS — R072 Precordial pain: Secondary | ICD-10-CM

## 2015-10-13 DIAGNOSIS — J209 Acute bronchitis, unspecified: Secondary | ICD-10-CM

## 2015-10-13 LAB — HEPATIC FUNCTION PANEL
ALBUMIN: 3.3 g/dL — AB (ref 3.5–5.0)
ALT: 135 U/L — ABNORMAL HIGH (ref 17–63)
AST: 76 U/L — AB (ref 15–41)
Alkaline Phosphatase: 62 U/L (ref 38–126)
BILIRUBIN DIRECT: 0.1 mg/dL (ref 0.1–0.5)
Indirect Bilirubin: 0.8 mg/dL (ref 0.3–0.9)
Total Bilirubin: 0.9 mg/dL (ref 0.3–1.2)
Total Protein: 6.9 g/dL (ref 6.5–8.1)

## 2015-10-13 LAB — CBC
HEMATOCRIT: 43.1 % (ref 39.0–52.0)
HEMOGLOBIN: 15.4 g/dL (ref 13.0–17.0)
MCH: 36.6 pg — ABNORMAL HIGH (ref 26.0–34.0)
MCHC: 35.7 g/dL (ref 30.0–36.0)
MCV: 102.4 fL — AB (ref 78.0–100.0)
Platelets: 241 10*3/uL (ref 150–400)
RBC: 4.21 MIL/uL — ABNORMAL LOW (ref 4.22–5.81)
RDW: 13 % (ref 11.5–15.5)
WBC: 7 10*3/uL (ref 4.0–10.5)

## 2015-10-13 LAB — BASIC METABOLIC PANEL
Anion gap: 7 (ref 5–15)
BUN: 19 mg/dL (ref 6–20)
CHLORIDE: 98 mmol/L — AB (ref 101–111)
CO2: 29 mmol/L (ref 22–32)
CREATININE: 0.84 mg/dL (ref 0.61–1.24)
Calcium: 8 mg/dL — ABNORMAL LOW (ref 8.9–10.3)
GFR calc Af Amer: >60 mL/min (ref >60)
GFR calc non Af Amer: >60 mL/min (ref >60)
Glucose, Bld: 124 mg/dL — ABNORMAL HIGH (ref 65–99)
Potassium: 3.6 mmol/L (ref 3.5–5.1)
Sodium: 134 mmol/L — ABNORMAL LOW (ref 135–145)

## 2015-10-13 LAB — CK TOTAL AND CKMB (NOT AT ARMC)
CK, MB: 4.3 ng/mL (ref 0.5–5.0)
RELATIVE INDEX: INVALID (ref 0.0–2.5)
Total CK: 12 U/L — ABNORMAL LOW (ref 49–397)

## 2015-10-13 LAB — RAPID URINE DRUG SCREEN, HOSP PERFORMED
Amphetamines: NOT DETECTED
BARBITURATES: NOT DETECTED
Benzodiazepines: NOT DETECTED
COCAINE: NOT DETECTED
Opiates: POSITIVE — AB
TETRAHYDROCANNABINOL: NOT DETECTED

## 2015-10-13 LAB — TROPONIN I
TROPONIN I: 0.05 ng/mL — AB (ref <0.031)
Troponin I: 0.04 ng/mL — ABNORMAL HIGH (ref <0.031)

## 2015-10-13 LAB — ECHOCARDIOGRAM COMPLETE
Height: 70 in
Weight: 3520 oz

## 2015-10-13 MED ORDER — HYDROCOD POLST-CPM POLST ER 10-8 MG/5ML PO SUER
5.0000 mL | Freq: Two times a day (BID) | ORAL | Status: DC | PRN
Start: 2015-10-13 — End: 2015-10-15

## 2015-10-13 MED ORDER — ALBUTEROL SULFATE (2.5 MG/3ML) 0.083% IN NEBU: 2.5000 mg | INHALATION_SOLUTION | RESPIRATORY_TRACT | Status: DC | PRN

## 2015-10-13 MED ORDER — LORATADINE 10 MG PO TABS
10.0000 mg | ORAL_TABLET | Freq: Every day | ORAL | Status: DC
  Administered 2015-10-13: 10 mg via ORAL
  Filled 2015-10-13: qty 1

## 2015-10-13 MED ORDER — BUDESONIDE 0.25 MG/2ML IN SUSP
0.2500 mg | Freq: Two times a day (BID) | RESPIRATORY_TRACT | Status: DC
  Administered 2015-10-13 – 2015-10-14 (×2): 0.25 mg via RESPIRATORY_TRACT
  Filled 2015-10-13 (×3): qty 2

## 2015-10-13 MED ORDER — ACETAMINOPHEN 325 MG PO TABS
650.0000 mg | ORAL_TABLET | Freq: Four times a day (QID) | ORAL | Status: DC | PRN
  Administered 2015-10-13 (×2): 650 mg via ORAL
  Filled 2015-10-13 (×2): qty 2

## 2015-10-13 MED ORDER — FLUTICASONE PROPIONATE 50 MCG/ACT NA SUSP
2.0000 | Freq: Every day | NASAL | Status: DC
  Administered 2015-10-13: 2 via NASAL
  Filled 2015-10-13: qty 16

## 2015-10-13 MED ORDER — ARFORMOTEROL TARTRATE 15 MCG/2ML IN NEBU
15.0000 ug | INHALATION_SOLUTION | Freq: Two times a day (BID) | RESPIRATORY_TRACT | Status: DC
  Administered 2015-10-13 – 2015-10-14 (×2): 15 ug via RESPIRATORY_TRACT
  Filled 2015-10-13 (×3): qty 2

## 2015-10-13 MED ORDER — DOXYCYCLINE HYCLATE 100 MG PO TABS
100.0000 mg | ORAL_TABLET | Freq: Two times a day (BID) | ORAL | Status: DC
  Administered 2015-10-13 (×2): 100 mg via ORAL
  Filled 2015-10-13 (×3): qty 1

## 2015-10-13 MED ORDER — GUAIFENESIN ER 600 MG PO TB12
600.0000 mg | ORAL_TABLET | Freq: Two times a day (BID) | ORAL | Status: DC
  Administered 2015-10-13 (×2): 600 mg via ORAL
  Filled 2015-10-13 (×3): qty 1

## 2015-10-13 MED ORDER — METHYLPREDNISOLONE SODIUM SUCC 40 MG IJ SOLR
40.0000 mg | Freq: Two times a day (BID) | INTRAMUSCULAR | Status: DC
  Administered 2015-10-13 – 2015-10-14 (×3): 40 mg via INTRAVENOUS
  Filled 2015-10-13 (×3): qty 1

## 2015-10-13 MED ORDER — PANTOPRAZOLE SODIUM 40 MG PO TBEC
40.0000 mg | DELAYED_RELEASE_TABLET | Freq: Every day | ORAL | Status: DC
  Administered 2015-10-13: 40 mg via ORAL
  Filled 2015-10-13: qty 1

## 2015-10-13 NOTE — Progress Notes (Signed)
Patient heart rate drops in the 30s while asleep. Per patient, this is not new for him. Patient asymptomatic. NP on call made aware and ask to monitor heart rate closely.

## 2015-10-13 NOTE — Consult Note (Signed)
CARDIOLOGY CONSULT NOTE   Patient ID: [name] MRN: [id number] DOB/AGE: January 25, 1966 49 y.o.  Admit date: 10/12/2015  Primary Physician   No primary care provider on file. Primary Cardiologist   New Reason for Consultation   Bradycardia Consult From: Dr Denton Brick  [id number] [name] is a 50 y.o. year old male with a history of polysubstance abuse (tobacco, ETOH, cocaine), no previous cardiac history, no recent checkups.   Admitted 05/24 with chest pain, troponin elevated and bradycardia on telemetry, cards asked to see.  Pt developed sharp chest pain radiating to the R. It has been happening with increasing frequency. He can feel it coming, 10/10. No association with exertion. No alleviating actions, no rx tried. Longest episode 10-15 minutes. Since in the hospital, he has had multiple episodes of chest pain, says has told staff, no documentation.   Pt was feeling SOB at times at rest and had DOE. No LE edema, wakes up because he is SOB in a dream at times. Has never been tested for sleep apnea, but he snores.   D/Randall 05/23 under MRN [id number] after admit for COPD exacerbation, ?PNA and cough-syncope. Cough felt to cause his chest pain, UDS + cocaine (pt denied).  From the syncope, he has L hand pain. Dilaudid has helped the hand pain, he does not think he was having chest pain at the time the dilaudid was given. No rx given for chest pain, per pt.   Pt has been light-headed and dizzy recently, it comes and goes. No clear orthostatic component. Has had no presyncope, the only episode of syncope (ever) was with extreme coughing.   Past Medical History  Diagnosis Date  . Panic attacks   . Tobacco abuse      Past Surgical History  Procedure Laterality Date  . Rotator cuff repair Right   . Orif metacarpal fracture Left 2011  . Foot surgery Right     Allergies  Allergen Reactions  . Penicillins Other (See Comments)    Has patient had a PCN reaction causing immediate  rash, facial/tongue/throat swelling, SOB or lightheadedness with hypotension: Unknow Has patient had a PCN reaction causing severe rash involving mucus membranes or skin necrosis: Unknown Has patient had a PCN reaction that required hospitalization unknown Has patient had a PCN reaction occurring within the last 10 years: No If all of the above answers are "NO", then may proceed with Cephalosporin use.     I have reviewed the patient's current medications . aspirin EC  325 mg Oral Daily  . atorvastatin  40 mg Oral q1800  . folic acid  1 mg Oral Daily  . heparin  5,000 Units Subcutaneous Q8H  . isosorbide mononitrate  30 mg Oral Daily  . sodium chloride flush  3 mL Intravenous Q12H     acetaminophen, hydrALAZINE, HYDROmorphone (DILAUDID) injection  Prior to Admission medications   Medication Sig Start Date End Date Taking? Authorizing Provider  azithromycin (ZITHROMAX Z-PAK) 250 MG tablet 2 po day one, then 1 daily x 4 days Patient not taking: Reported on 10/12/2015 07/22/15   Elnora Morrison, MD     Social History   Social History  . Marital Status: Single    Spouse Name: N/A  . Number of Children: N/A  . Years of Education: N/A   Occupational History  . Not employed    Social History Main Topics  . Smoking status: Former Smoker -- 1.00 packs/day    Types: Cigarettes  Quit date: 09/22/2015  . Smokeless tobacco: Never Used  . Alcohol Use: 0.0 oz/week    0 Standard drinks or equivalent per week     Comment: heavy- pt denies  . Drug Use: Yes    Special: Cocaine     Comment: pt denies  . Sexual Activity: No   Other Topics Concern  . Not on file   Social History Narrative   Currently homeless    Family Status  Relation Status Death Age  . Mother Alive    Family History  Problem Relation Age of Onset  . Cancer Other   . Stroke Other   . Coronary artery disease Other   . Hypertension Mother      ROS:  Full 14 point review of systems complete and found to be  negative unless listed above.  Physical Exam: Blood pressure 146/92, pulse 54, temperature 98.2 F (36.8 Randall), temperature source Oral, resp. rate 18, height 5\' 10"  (1.778 m), weight 220 lb (99.791 kg), SpO2 98 %.  General: Well developed, well nourished, male in no acute distress Head: Eyes PERRLA, No xanthomas.   Normocephalic and atraumatic, oropharynx without edema or exudate. Dentition: poor Lungs: decreased bases w/ some rales Heart: HRRR S1 S2, no rub/gallop, no murmur. pulses are 2+ all 4 extrem.   Neck: No carotid bruits. No lymphadenopathy.  JVD minimal elevation. Abdomen: Bowel sounds present, abdomen soft and non-tender without masses or hernias noted. Liver edge 2cm below ribs Msk:  No spine or cva tenderness. No weakness, no joint deformities or effusions. Extremities: No clubbing or cyanosis. no edema.  Neuro: Alert and oriented X 3. No focal deficits noted. Psych:  Good affect, responds appropriately Skin: No rashes or lesions noted.  Labs:   Lab Results  Component Value Date   WBC 7.0 10/13/2015   HGB 15.4 10/13/2015   HCT 43.1 10/13/2015   MCV 102.4* 10/13/2015   PLT 241 10/13/2015     Recent Labs Lab 10/13/15 0635  NA 134*  K 3.6  CL 98*  CO2 29  BUN 19  CREATININE 0.84  CALCIUM 8.0*  GLUCOSE 124*   Lab Results  Component Value Date   ALT 105* 11/12/2011   AST 69* 11/12/2011   ALKPHOS 64 11/12/2011   BILITOT 0.3 11/12/2011    Recent Labs  10/12/15 1316 10/12/15 1845 10/13/15 0019 10/13/15 0635  TROPONINI 0.04* 0.04* 0.05* 0.04*   B NATRIURETIC PEPTIDE  Date/Time Value Ref Range Status  10/12/2015 06:45 PM 159.2* 0.0 - 100.0 pg/mL Final   TSH  Date/Time Value Ref Range Status  10/12/2015 06:45 PM 1.820 0.350 - 4.500 uIU/mL Final   Drugs of Abuse     Component Value Date/Time   LABOPIA NONE DETECTED 01/12/2015 1010   COCAINSCRNUR NONE DETECTED 01/12/2015 1010   LABBENZ NONE DETECTED 01/12/2015 1010   AMPHETMU NONE DETECTED  01/12/2015 1010   THCU NONE DETECTED 01/12/2015 1010   LABBARB NONE DETECTED 01/12/2015 1010    ECG:  05/25 Sinus brady, HR 48 w/ PR 88 ms, LVH  07/2015 ECG w/ PR 126 ms   Radiology:  Dg Chest 2 View  10/12/2015  CLINICAL DATA:  50 year old male with mid chest pain, shortness of breath and productive cough. Initial encounter. Former smoker. EXAM: CHEST  2 VIEW COMPARISON:  07/22/2015 and earlier. FINDINGS: Stable lung volumes. Stable cardiac size at the upper limits of normal to mildly enlarged. Other mediastinal contours are within normal limits. Visualized tracheal air column is within normal  limits. No pneumothorax or pulmonary edema. No pleural effusion or acute pulmonary opacity. Stable chronic increased interstitial markings. No acute osseous abnormality identified. IMPRESSION: No acute cardiopulmonary abnormality. Electronically Signed   By: Genevie Ann M.D.   On: 10/12/2015 13:05    ASSESSMENT AND PLAN:   The patient was seen today by Dr Debara Pickett, the patient evaluated and the data reviewed.  Principal Problem:   SOB (shortness of breath) - unclear cause - he coughs at times, but CXR not abnl and BNP not very high - recent admit for COPD exacerbation and ?PNA - per IM  Active Problems:   Troponin level elevated   Chest pain - sx are not typical or exertional - minimal elevation in troponin not convincing for ACS -  Will ck CKMB and LFTs - Echo ordered, f/u on results. - MD advise on stress testing vs cath    Cocaine abuse - recheck UDS, it was + under other MRN on 05/20  Signed: Rosaria Ferries, Hershal Coria 10/13/2015 10:28 AM Beeper [id number]  Co-Sign MD

## 2015-10-13 NOTE — Progress Notes (Signed)
PROGRESS NOTE        PATIENT DETAILS Name: [name] Age: 50 y.o. Sex: male Date of Birth: 1965-10-07 Admit Date: 10/12/2015 Admitting Physician Courage Denton Brick, MD PCP:No primary care provider on file.   Brief Narrative: Patient is a 50 y.o. male with history of cocaine abuse, probable history of COPD who presented with ongoing shortness of breath and cough. He was just recently discharged from [facility] on 5/23 for similar complaints (under [id number] and the name Macoy Sixtos).  Subjective: Continues to complain of cough and mostly exertional dyspnea.  Assessment/Plan: Principal Problem: Likely acute bronchitis: Coughing repeatedly during my exam, continues to have mostly exertional dyspnea-minimal rhonchi on exam. Start IV Solu-Medrol, bronchodilators, doxycycline and follow clinical course. Chest x-ray does not show any infiltrates or interstitial lung disease.  Active Problems: Bradycardia: TSH within normal limits, appears to be sinus bradycardia on 12-lead EKG. Avoid beta blocker CHANNEL blockers or any other AV nodal blocking agents. Await further input from cardiology.  Atypical chest pain with minimally elevated troponin: In a setting of cocaine abuse (although denies but UDS positive on 5/20). Recent echocardiogram on 5/20 without any wall motion abnormalities and preserved EF. Continue aspirin, await cardiology evaluation.  Moderate pulmonary hypertension: Seen on a recent echocardiogram, suspect related to obesity.  Obesity: Counseled regarding importance of weight loss  Polysubstance abuse: UDS positive for cocaine on 5/20-however patient denies. Counseled. Check HIV status.  DVT Prophylaxis: Prophylactic Heparin  Code Status: Full code  Family Communication: None at bedside  Disposition Plan: Remain inpatient-home in next 1-2 days-depending on clinical response  Antimicrobial agents: Doxycycline  5/25>>  Procedures: None  CONSULTS:  cardiology  Time spent: 25 minutes-Greater than 50% of this time was spent in counseling, explanation of diagnosis, planning of further management, and coordination of care.  MEDICATIONS: Anti-infectives    Start     Dose/Rate Route Frequency Ordered Stop   10/13/15 1130  doxycycline (VIBRA-TABS) tablet 100 mg     100 mg Oral Every 12 hours 10/13/15 1126        Scheduled Meds: . arformoterol  15 mcg Nebulization BID  . aspirin EC  325 mg Oral Daily  . atorvastatin  40 mg Oral q1800  . budesonide (PULMICORT) nebulizer solution  0.25 mg Nebulization BID  . doxycycline  100 mg Oral Q12H  . fluticasone  2 spray Each Nare Daily  . folic acid  1 mg Oral Daily  . guaiFENesin  600 mg Oral BID  . heparin  5,000 Units Subcutaneous Q8H  . isosorbide mononitrate  30 mg Oral Daily  . loratadine  10 mg Oral Daily  . methylPREDNISolone (SOLU-MEDROL) injection  40 mg Intravenous Q12H  . pantoprazole  40 mg Oral Daily  . sodium chloride flush  3 mL Intravenous Q12H   Continuous Infusions:  PRN Meds:.acetaminophen, albuterol, chlorpheniramine-HYDROcodone, hydrALAZINE, HYDROmorphone (DILAUDID) injection   PHYSICAL EXAM: Vital signs: Filed Vitals:   10/12/15 2034 10/12/15 2139 10/13/15 0521 10/13/15 1006  BP: 153/102 156/93 146/92 148/94  Pulse: 69 66 54 51  Temp: 97.9 F (36.6 C)  98.2 F (36.8 C)   TempSrc: Oral  Oral   Resp: 18  18   Height:      Weight:      SpO2: 95%  98% 97%   Filed Weights   10/12/15 1700  Weight: 99.791 kg (220  lb)   Body mass index is 31.57 kg/(m^2).   Gen Exam: Awake and alert with clear speech. Not in any distress  Neck: Supple, No JVD.   Chest: Good air entry bilaterally with a few scattered rhonchi. CVS: S1 S2 Regular, no murmurs.  Abdomen: soft, BS +, non tender, non distended.  Extremities: no edema, lower extremities warm to touch. Neurologic: Non Focal.   Skin: No Rash or lesions   Wounds: N/A.    LABORATORY DATA: CBC:  Recent Labs Lab 10/12/15 1316 10/13/15 0635  WBC 8.2 7.0  NEUTROABS 4.6  --   HGB 16.2 15.4  HCT 44.1 43.1  MCV 100.9* 102.4*  PLT 256 [id number]    Basic Metabolic Panel:  Recent Labs Lab 10/12/15 1316 10/13/15 0635  NA 138 134*  K 3.5 3.6  CL 101 98*  CO2 32 29  GLUCOSE 121* 124*  BUN 17 19  CREATININE 0.92 0.84  CALCIUM 8.5* 8.0*    GFR: Estimated Creatinine Clearance: 123.2 mL/min (by C-G formula based on Cr of 0.84).  Liver Function Tests: No results for input(s): AST, ALT, ALKPHOS, BILITOT, PROT, ALBUMIN in the last 168 hours. No results for input(s): LIPASE, AMYLASE in the last 168 hours. No results for input(s): AMMONIA in the last 168 hours.  Coagulation Profile: No results for input(s): INR, PROTIME in the last 168 hours.  Cardiac Enzymes:  Recent Labs Lab 10/12/15 1316 10/12/15 1845 10/13/15 0019 10/13/15 0635  TROPONINI 0.04* 0.04* 0.05* 0.04*    BNP (last 3 results) No results for input(s): PROBNP in the last 8760 hours.  HbA1C:  Recent Labs  10/12/15 1845  HGBA1C 6.3*    CBG: No results for input(s): GLUCAP in the last 168 hours.  Lipid Profile: No results for input(s): CHOL, HDL, LDLCALC, TRIG, CHOLHDL, LDLDIRECT in the last 72 hours.  Thyroid Function Tests:  Recent Labs  10/12/15 1845  TSH 1.820    Anemia Panel: No results for input(s): VITAMINB12, FOLATE, FERRITIN, TIBC, IRON, RETICCTPCT in the last 72 hours.  Urine analysis:    Component Value Date/Time   COLORURINE YELLOW 10/16/2010 0217   APPEARANCEUR CLEAR 10/16/2010 0217   LABSPEC 1.043* 10/16/2010 0217   PHURINE 5.5 10/16/2010 0217   GLUCOSEU NEGATIVE 10/16/2010 0217   HGBUR SMALL* 10/16/2010 0217   BILIRUBINUR SMALL* 10/16/2010 0217   KETONESUR NEGATIVE 10/16/2010 0217   PROTEINUR NEGATIVE 10/16/2010 0217   UROBILINOGEN 1.0 10/16/2010 0217   NITRITE NEGATIVE 10/16/2010 0217   LEUKOCYTESUR NEGATIVE 10/16/2010 0217    Sepsis  Labs: Lactic Acid, Venous No results found for: LATICACIDVEN  MICROBIOLOGY: No results found for this or any previous visit (from the past 240 hour(s)).  RADIOLOGY STUDIES/RESULTS: Dg Chest 2 View  10/12/2015  CLINICAL DATA:  50 year old male with mid chest pain, shortness of breath and productive cough. Initial encounter. Former smoker. EXAM: CHEST  2 VIEW COMPARISON:  07/22/2015 and earlier. FINDINGS: Stable lung volumes. Stable cardiac size at the upper limits of normal to mildly enlarged. Other mediastinal contours are within normal limits. Visualized tracheal air column is within normal limits. No pneumothorax or pulmonary edema. No pleural effusion or acute pulmonary opacity. Stable chronic increased interstitial markings. No acute osseous abnormality identified. IMPRESSION: No acute cardiopulmonary abnormality. Electronically Signed   By: Genevie Ann M.D.   On: 10/12/2015 13:05       Oren Binet, MD  [facility] Pager:[phone]  If 7PM-7AM, please contact night-coverage [email or web address] Password [facility] 10/13/2015, 11:29 AM

## 2015-10-13 NOTE — Progress Notes (Signed)
Patient heart rates continues  to drop in the 30s. Asymptomatic. This morning, patient started to have pauses up to  2.96 sec. EKG perform.  Attending MD paged at this time.

## 2015-10-13 NOTE — Progress Notes (Signed)
Lower extremity venous duplex order received. Per Dr. Sloan Leiter, ok to do test 10/14/15.  10/13/2015 4:20 PM Maudry Mayhew, RVT, RDCS, RDMS

## 2015-10-13 NOTE — Care Management Note (Signed)
Case Management Note  Patient Details  Name: [name] MRN: [id number] Date of Birth: 07/13/1965  Subjective/Objective: 50 y/o m admitted w/Chest pain. Noted-homeless-CSW following.                   Action/Plan:d/c plan shelter.   Expected Discharge Date:   (unknown)               Expected Discharge Plan:  Homeless Shelter  In-House Referral:  Clinical Social Work  Discharge planning Services  CM Consult  Post Acute Care Choice:    Choice offered to:     DME Arranged:    DME Agency:     HH Arranged:    [place] Agency:     Status of Service:  In process, will continue to follow  Medicare Important Message Given:    Date Medicare IM Given:    Medicare IM give by:    Date Additional Medicare IM Given:    Additional Medicare Important Message give by:     If discussed at [place] of Stay Meetings, dates discussed:    Additional Comments:  Dessa Phi, RN 10/13/2015, 2:49 PM

## 2015-10-13 NOTE — Progress Notes (Signed)
  Echocardiogram 2D Echocardiogram has been performed.  [name] 10/13/2015, 12:04 PM

## 2015-10-14 ENCOUNTER — Encounter (HOSPITAL_COMMUNITY): Payer: Self-pay | Admitting: Cardiovascular Disease

## 2015-10-14 ENCOUNTER — Ambulatory Visit (HOSPITAL_COMMUNITY)
Admission: RE | Admit: 2015-10-14 | Discharge: 2015-10-14 | Disposition: A | Discharge status: Home / Self Care | Source: Ambulatory Visit | Attending: Physician Assistant | Admitting: Physician Assistant

## 2015-10-14 ENCOUNTER — Other Ambulatory Visit: Payer: Self-pay

## 2015-10-14 ENCOUNTER — Inpatient Hospital Stay (HOSPITAL_COMMUNITY)

## 2015-10-14 ENCOUNTER — Encounter (HOSPITAL_COMMUNITY)
Admission: EM | Discharge status: Against Medical Advice | Payer: Self-pay | Source: Home / Self Care | Attending: Internal Medicine

## 2015-10-14 ENCOUNTER — Ambulatory Visit (HOSPITAL_COMMUNITY)
Admit: 2015-10-14 | Discharge: 2015-10-14 | Disposition: A | Discharge status: Home / Self Care | Attending: Physician Assistant | Admitting: Physician Assistant

## 2015-10-14 ENCOUNTER — Ambulatory Visit (HOSPITAL_COMMUNITY)
Admit: 2015-10-14 | Discharge: 2015-10-14 | Disposition: A | Discharge status: Home / Self Care | Payer: No Typology Code available for payment source | Attending: Physician Assistant | Admitting: Physician Assistant

## 2015-10-14 DIAGNOSIS — R609 Edema, unspecified: Secondary | ICD-10-CM

## 2015-10-14 DIAGNOSIS — R079 Chest pain, unspecified: Secondary | ICD-10-CM

## 2015-10-14 DIAGNOSIS — R778 Other specified abnormalities of plasma proteins: Secondary | ICD-10-CM | POA: Insufficient documentation

## 2015-10-14 DIAGNOSIS — R7989 Other specified abnormal findings of blood chemistry: Secondary | ICD-10-CM

## 2015-10-14 HISTORY — PX: CARDIAC CATHETERIZATION: SHX172

## 2015-10-14 LAB — HIV ANTIBODY (ROUTINE TESTING W REFLEX): HIV Screen 4th Generation wRfx: NONREACTIVE

## 2015-10-14 LAB — HEMOGLOBIN A1C
HEMOGLOBIN A1C: 6.3 % — AB (ref 4.8–5.6)
Mean Plasma Glucose: 134 mg/dL

## 2015-10-14 SURGERY — LEFT HEART CATH AND CORONARY ANGIOGRAPHY
Anesthesia: LOCAL

## 2015-10-14 MED ORDER — ONDANSETRON HCL 4 MG/2ML IJ SOLN: 4.0000 mg | Freq: Four times a day (QID) | INTRAMUSCULAR | Status: DC | PRN

## 2015-10-14 MED ORDER — SODIUM CHLORIDE 0.9 % WEIGHT BASED INFUSION: 1.0000 mL/kg/h | INTRAVENOUS | Status: DC

## 2015-10-14 MED ORDER — IOPAMIDOL (ISOVUE-370) INJECTION 76%
INTRAVENOUS | Status: AC
  Filled 2015-10-14: qty 100

## 2015-10-14 MED ORDER — SODIUM CHLORIDE 0.9% FLUSH: 3.0000 mL | INTRAVENOUS | Status: DC | PRN

## 2015-10-14 MED ORDER — SODIUM CHLORIDE 0.9 % WEIGHT BASED INFUSION: 3.0000 mL/kg/h | INTRAVENOUS | Status: DC

## 2015-10-14 MED ORDER — FENTANYL CITRATE (PF) 100 MCG/2ML IJ SOLN
INTRAMUSCULAR | Status: DC | PRN
  Administered 2015-10-14: 50 ug via INTRAVENOUS

## 2015-10-14 MED ORDER — ASPIRIN 81 MG PO CHEW: 81.0000 mg | CHEWABLE_TABLET | ORAL | Status: DC

## 2015-10-14 MED ORDER — MIDAZOLAM HCL 2 MG/2ML IJ SOLN
INTRAMUSCULAR | Status: AC
  Filled 2015-10-14: qty 2

## 2015-10-14 MED ORDER — SODIUM CHLORIDE 0.9 % IV SOLN: 250.0000 mL | INTRAVENOUS | Status: DC | PRN

## 2015-10-14 MED ORDER — LIDOCAINE HCL (PF) 1 % IJ SOLN
INTRAMUSCULAR | Status: DC | PRN
  Administered 2015-10-14: 3 mL

## 2015-10-14 MED ORDER — IOPAMIDOL (ISOVUE-370) INJECTION 76%
INTRAVENOUS | Status: DC | PRN
  Administered 2015-10-14: 90 mL

## 2015-10-14 MED ORDER — HEPARIN SODIUM (PORCINE) 1000 UNIT/ML IJ SOLN
INTRAMUSCULAR | Status: AC
  Filled 2015-10-14: qty 1

## 2015-10-14 MED ORDER — SODIUM CHLORIDE 0.9% FLUSH: 3.0000 mL | Freq: Two times a day (BID) | INTRAVENOUS | Status: DC

## 2015-10-14 MED ORDER — NITROGLYCERIN 0.4 MG SL SUBL
SUBLINGUAL_TABLET | SUBLINGUAL | Status: AC
  Filled 2015-10-14: qty 1

## 2015-10-14 MED ORDER — FENTANYL CITRATE (PF) 100 MCG/2ML IJ SOLN
INTRAMUSCULAR | Status: AC
  Filled 2015-10-14: qty 2

## 2015-10-14 MED ORDER — HEPARIN (PORCINE) IN NACL 2-0.9 UNIT/ML-% IJ SOLN
INTRAMUSCULAR | Status: AC
  Filled 2015-10-14: qty 1000

## 2015-10-14 MED ORDER — SODIUM CHLORIDE 0.9 % IV SOLN: INTRAVENOUS | Status: AC

## 2015-10-14 MED ORDER — NITROGLYCERIN 0.4 MG SL SUBL
0.4000 mg | SUBLINGUAL_TABLET | SUBLINGUAL | Status: DC | PRN
  Administered 2015-10-14 (×2): 0.4 mg via SUBLINGUAL

## 2015-10-14 MED ORDER — HEPARIN (PORCINE) IN NACL 2-0.9 UNIT/ML-% IJ SOLN
INTRAMUSCULAR | Status: DC | PRN
  Administered 2015-10-14: 11:00:00

## 2015-10-14 MED ORDER — VERAPAMIL HCL 2.5 MG/ML IV SOLN
INTRAVENOUS | Status: DC | PRN
  Administered 2015-10-14: 10 mL via INTRA_ARTERIAL

## 2015-10-14 MED ORDER — SODIUM CHLORIDE 0.9% FLUSH
3.0000 mL | Freq: Two times a day (BID) | INTRAVENOUS | Status: DC
  Administered 2015-10-14: 3 mL via INTRAVENOUS

## 2015-10-14 MED ORDER — VERAPAMIL HCL 2.5 MG/ML IV SOLN
INTRAVENOUS | Status: AC
  Filled 2015-10-14: qty 2

## 2015-10-14 MED ORDER — MIDAZOLAM HCL 2 MG/2ML IJ SOLN
INTRAMUSCULAR | Status: DC | PRN
Start: 2015-10-14 — End: 2015-10-14
  Administered 2015-10-14: 2 mg via INTRAVENOUS

## 2015-10-14 MED ORDER — HEPARIN SODIUM (PORCINE) 1000 UNIT/ML IJ SOLN
INTRAMUSCULAR | Status: DC | PRN
Start: 2015-10-14 — End: 2015-10-14
  Administered 2015-10-14: 5000 [IU] via INTRAVENOUS

## 2015-10-14 MED ORDER — LIDOCAINE HCL (PF) 1 % IJ SOLN
INTRAMUSCULAR | Status: AC
  Filled 2015-10-14: qty 30

## 2015-10-14 SURGICAL SUPPLY — 11 items
CATH INFINITI 5 FR JL3.5 (CATHETERS) ×1 IMPLANT
CATH INFINITI 5FR ANG PIGTAIL (CATHETERS) ×1 IMPLANT
CATH INFINITI JR4 5F (CATHETERS) ×1 IMPLANT
DEVICE RAD COMP TR BAND LRG (VASCULAR PRODUCTS) ×1 IMPLANT
GLIDESHEATH SLEND SS 6F .021 (SHEATH) ×1 IMPLANT
KIT HEART LEFT (KITS) ×2 IMPLANT
PACK CARDIAC CATHETERIZATION (CUSTOM PROCEDURE TRAY) ×2 IMPLANT
SYR MEDRAD MARK V 150ML (SYRINGE) ×2 IMPLANT
TRANSDUCER W/STOPCOCK (MISCELLANEOUS) ×2 IMPLANT
TUBING CIL FLEX 10 FLL-RA (TUBING) ×2 IMPLANT
WIRE SAFE-T 1.5MM-J .035X260CM (WIRE) ×1 IMPLANT

## 2015-10-14 NOTE — Progress Notes (Signed)
  The patient presented to [facility] for a 1-day NST but reported having new-onset 10/10 chest pain, different from previous symptoms. The pain was not responsive to 2 SL NTG. EKG showed no acute changes. With his continued pain, the case was discussed with Dr. Debara Pickett who recommended cardiac catheterization.  The patient understands that risks included but are not limited to stroke (1 in 1000), death (1 in 5), kidney failure [usually temporary] (1 in 500), bleeding (1 in 200), allergic reaction [possibly serious] (1 in 200).   He will undergo a cardiac catheterization with Dr. Burt Knack this morning.  Signed, Erma Heritage, PA-C 10/14/2015, 10:18 AM Pager: [phone]

## 2015-10-14 NOTE — Progress Notes (Signed)
Patient Name: [name] Date of Encounter: 10/14/2015  Principal Problem:   SOB (shortness of breath) Active Problems:   Troponin level elevated   Chest pain   Cocaine abuse   Primary Cardiologist: new, Dr Debara Pickett  Patient Profile: 50 y.o. year old male with a history of polysubstance abuse (tobacco, ETOH, cocaine), no previous cardiac history, no known HTN/DM/HLD but no recent checkups was admitted 05/24 for chest pain, MV planned.   SUBJECTIVE: C/o 10/10 hand pain (from when he had cough-syncope and fell prior to the previous admit) and 4/10 chest pain (continuous). No SOB  OBJECTIVE Filed Vitals:   10/13/15 2034 10/14/15 0618 10/14/15 0733 10/14/15 0734  BP: 150/94 111/76    Pulse: 61 58    Temp: 97 F (36.1 C) 98.6 F (37 C)    TempSrc: Oral Oral    Resp: 18 18    Height:      Weight:      SpO2: 98% 96% 95% 95%    Intake/Output Summary (Last 24 hours) at 10/14/15 0739 Last data filed at 10/13/15 1006  Gross per 24 hour  Intake      3 ml  Output      0 ml  Net      3 ml   Filed Weights   10/12/15 1700  Weight: 220 lb (99.791 kg)    PHYSICAL EXAM General: Well developed, well nourished, male in no acute distress. Head: Normocephalic, atraumatic.  Neck: Supple without bruits, JVD not elevated Lungs:  Resp regular and unlabored, decreased BS bases Heart: RRR, S1, S2, no S3, S4, or murmur; no rub. Abdomen: Soft, non-tender, non-distended, BS + x 4.  Extremities: No clubbing, cyanosis, edema.  Neuro: Alert and oriented X 3. Moves all extremities spontaneously. Psych: Normal affect.  LABS: CBC: Recent Labs  10/12/15 1316 10/13/15 0635  WBC 8.2 7.0  NEUTROABS 4.6  --   HGB 16.2 15.4  HCT 44.1 43.1  MCV 100.9* 102.4*  PLT 256 [id number]   Basic Metabolic Panel: Recent Labs  10/12/15 1316 10/13/15 0635  NA 138 134*  K 3.5 3.6  CL 101 98*  CO2 32 29  GLUCOSE 121* 124*  BUN 17 19  CREATININE 0.92 0.84  CALCIUM 8.5* 8.0*   Liver  Function Tests: Recent Labs  10/13/15 1117  AST 76*  ALT 135*  ALKPHOS 62  BILITOT 0.9  PROT 6.9  ALBUMIN 3.3*   Cardiac Enzymes: Recent Labs  10/12/15 1845 10/13/15 0019 10/13/15 0635 10/13/15 1122  CKTOTAL  --   --   --  12*  CKMB  --   --   --  4.3  TROPONINI 0.04* 0.05* 0.04*  --    BNP:  B NATRIURETIC PEPTIDE  Date/Time Value Ref Range Status  10/12/2015 06:45 PM 159.2* 0.0 - 100.0 pg/mL Final   Hemoglobin A1C: Recent Labs  10/12/15 1845  HGBA1C 6.3*   Thyroid Function Tests: Recent Labs  10/12/15 1845  TSH 1.820   Drugs of Abuse     Component Value Date/Time   LABOPIA POSITIVE* 10/13/2015 1213   COCAINSCRNUR NONE DETECTED 10/13/2015 1213   LABBENZ NONE DETECTED 10/13/2015 1213   AMPHETMU NONE DETECTED 10/13/2015 1213   THCU NONE DETECTED 10/13/2015 1213   LABBARB NONE DETECTED 10/13/2015 1213     TELE:  SR, S brady occ 30s, mostly high 40s and 50s. PR is narrow at times, occ junctional escape beats    Radiology/Studies: Dg Chest 2 View  10/12/2015  CLINICAL DATA:  50 year old male with mid chest pain, shortness of breath and productive cough. Initial encounter. Former smoker. EXAM: CHEST  2 VIEW COMPARISON:  07/22/2015 and earlier. FINDINGS: Stable lung volumes. Stable cardiac size at the upper limits of normal to mildly enlarged. Other mediastinal contours are within normal limits. Visualized tracheal air column is within normal limits. No pneumothorax or pulmonary edema. No pleural effusion or acute pulmonary opacity. Stable chronic increased interstitial markings. No acute osseous abnormality identified. IMPRESSION: No acute cardiopulmonary abnormality. Electronically Signed   By: Genevie Ann M.D.   On: 10/12/2015 13:05     Current Medications:  . arformoterol  15 mcg Nebulization BID  . aspirin EC  325 mg Oral Daily  . atorvastatin  40 mg Oral q1800  . budesonide (PULMICORT) nebulizer solution  0.25 mg Nebulization BID  . doxycycline  100 mg Oral  Q12H  . fluticasone  2 spray Each Nare Daily  . folic acid  1 mg Oral Daily  . guaiFENesin  600 mg Oral BID  . heparin  5,000 Units Subcutaneous Q8H  . isosorbide mononitrate  30 mg Oral Daily  . loratadine  10 mg Oral Daily  . methylPREDNISolone (SOLU-MEDROL) injection  40 mg Intravenous Q12H  . pantoprazole  40 mg Oral Daily  . sodium chloride flush  3 mL Intravenous Q12H      ASSESSMENT AND PLAN: Principal Problem: 1. SOB (shortness of breath) - unclear cause - he coughs at times, but CXR not abnl and BNP not very high - recent admit for COPD exacerbation and ?PNA - per IM  Active Problems: 2. Troponin level elevated 3. Chest pain - sx are not typical or exertional - minimal elevation in troponin not convincing for ACS - CK low, MB normal, LFTs with mildly elevated transaminases - Echo performed, results pending - MV today   Cocaine abuse - UDS neg for cocaine now, it was + under other MRN on 05/20  Jonetta Speak , PA-C 7:39 AM 10/14/2015

## 2015-10-14 NOTE — Progress Notes (Signed)
Pt extremely agitated, raising voice to staff, explained MD was on way.

## 2015-10-14 NOTE — Progress Notes (Signed)
Please see below most recent SW assessment on 10/10/15. Patient has been given resources regarding homeless issues.  Will follow up if needs arise, but at this time he has been given resources and declined SA interventions. Lane Hacker, MSW Clinical Social Work: [place] [phone]    Patient Information    Patient Name Sex DOB SSN   [name], [name] Male 1966/02/25 [id number]    Clinical Social Work Note by Candie Chroman, LCSW at 10/10/2015 12:24 PM    Author: Candie Chroman, LCSW Service: Clinical Social Work Author Type: Social Worker   Filed: 10/10/2015 12:27 PM Note Time: 10/10/2015 12:24 PM Status: Signed   Editor: Candie Chroman, LCSW (Social Worker)     Expand All Collapse All   Clinical Social Work Assessment  Patient Details  Name: [name] MRN: [id number] Date of Birth: 07/13/1965  Date of referral: 10/08/15   Reason for consult: Housing Concerns/Homelessness, Substance Use/ETOH Abuse     Permission sought to share information with:   Permission granted to share information:: No Name::   Agency::   Relationship::   Contact Information:    Housing/Transportation Living arrangements for the past 2 months: Homeless Source of Information: Patient, Medical Team Patient Interpreter Needed: None Criminal Activity/Legal Involvement Pertinent to Current Situation/Hospitalization: No - Comment as needed Significant Relationships: None Lives with: Self Do you feel safe going back to the place where you live? No Need for family participation in patient care: No (Coment)  Care giving concerns: Patient is homeless with no supports. Current cocaine use.   Social Worker assessment / plan: CSW met with patient. No supports at bedside. CSW introduced role and explained that discharge planning would be discussed. Patient does not have  a plan of where to go following discharge. CSW provided shelter list in [place] area. CSW asked if patient was interested in substance abuse or mental health resources. Patient declined. No further concerns. CSW encouraged patient to contact CSW as needed. CSW will continue to follow patient for support and if any other social work needs arise.  Employment status: Unemployed Forensic scientist: Self Pay (Medicaid Pending) PT Recommendations: Not assessed at this time Information / Referral to community resources: Shelter  Patient/Family's Response to care: Patient agreeable to shelter resources. Declined substance abuse or mental health resources. Patient does not have supports. Patient polite and appreciated social work intervention.  Patient/Family's Understanding of and Emotional Response to Diagnosis, Current Treatment, and Prognosis: CSW understanding of medical interventions. Patient aware that CSW provided shelter resources for discharge.  Emotional Assessment Appearance: Appears stated age Attitude/Demeanor/Rapport: Other (Irritable) Affect (typically observed): Agitated, Frustrated Orientation: Oriented to Self, Oriented to Place, Oriented to Time, Oriented to Situation Alcohol / Substance use: Illicit Drugs Psych involvement (Current and /or in the community): No (Comment)  Discharge Needs  Concerns to be addressed: Patient refuses services, Substance Abuse Concerns, Homelessness Readmission within the last 30 days: No Current discharge risk: Homeless, Lack of support system, Substance Abuse Barriers to Discharge: Active Substance Use, Homeless with medical needs, Inadequate or no insurance   Candie Chroman, [place] 10/10/2015, 12:24 PM

## 2015-10-14 NOTE — Progress Notes (Signed)
*  Preliminary Results* Bilateral lower extremity venous duplex completed. Bilateral lower extremities are negative for deep vein thrombosis. There is no evidence of Baker's cyst bilaterally.  Of note: When I arrived to the patient's room and asked him to confirm his name and date of birth, he stated his name was [name], April 02, 1966. This information matched the information on the order requisition for the exam, as well as his identification wristband, however this does not match the information in [name] that lists the patient's name as [name], DOB 07/13/1965. The order requisition lists the name as [name], I asked the patient what C stands for, and he stated [name]. The exam was completed with the name "[name]" DOB 05/05/1966. I have made Joelene Millin, RN aware of this matter.  10/14/2015  Maudry Mayhew, RVT, RDCS, RDMS

## 2015-10-14 NOTE — Interval H&P Note (Signed)
Cath Lab Visit (complete for each Cath Lab visit)  Clinical Evaluation Leading to the Procedure:   ACS: Yes.    Non-ACS:    Anginal Classification: CCS IV  Anti-ischemic medical therapy: Minimal Therapy (1 class of medications)  Non-Invasive Test Results: No non-invasive testing performed  Prior CABG: No previous CABG      History and Physical Interval Note:  10/14/2015 10:44 AM  [place]  has presented today for surgery, with the diagnosis of chest pain  The various methods of treatment have been discussed with the patient and family. After consideration of risks, benefits and other options for treatment, the patient has consented to  Procedure(s): Left Heart Cath and Coronary Angiography (N/A) as a surgical intervention .  The patient's history has been reviewed, patient examined, no change in status, stable for surgery.  I have reviewed the patient's chart and labs.  Questions were answered to the patient's satisfaction.     Sherren Mocha

## 2015-10-14 NOTE — Progress Notes (Signed)
PROGRESS NOTE        PATIENT DETAILS Name: [name] Age: 50 y.o. Sex: male Date of Birth: Oct 11, 1965 Admit Date: 10/12/2015 Admitting Physician Courage Denton Brick, MD PCP:No PCP Per Patient   Brief Narrative: Patient is a 50 y.o. male with history of cocaine abuse, probable history of COPD who presented with ongoing shortness of breath and cough. He was just recently discharged from [facility] on 5/23 for similar complaints (under [id number] and the name [name]).  Subjective: Continues to complain of mostly exertional dyspnea. No chest pain or I saw him this morning.  Assessment/Plan: Principal Problem: Likely acute bronchitis:Improving, only some scattered wheezing on my exam, continues to complain of mostly exertional dyspnea and sometimes dyspnea at rest as well. Continue IV Solu-Medrol, bronchodilators, doxycycline and follow clinical course. Chest x-ray does not show any infiltrates or interstitial lung disease.  Active Problems: Bradycardia: TSH within normal limits, appears to be sinus bradycardia on 12-lead EKG. Avoid beta blocker CHANNEL blockers or any other AV nodal blocking agents. Await further input from cardiology.  Atypical chest pain with minimally elevated troponin: In a setting of cocaine abuse (although denies but UDS positive on 5/20). Recent echocardiogram on 5/20 without any wall motion abnormalities and preserved EF. Seen by cardiology, plans were for stress test today, but developed chest pain while at nuclear medicine, subsequently plans are for cardiac catheterization later today.   Moderate pulmonary hypertension: Seen on a recent echocardiogram, suspect related to obesity.  Obesity: Counseled regarding importance of weight loss  Polysubstance abuse: UDS positive for cocaine on 5/20-however patient denies. Counseled. Check HIV status.  DVT Prophylaxis: Prophylactic Heparin  Code Status: Full code  Family  Communication: None at bedside  Disposition Plan: Remain inpatient-home in next 1-2 days-depending on clinical response  Antimicrobial agents: Doxycycline 5/25>>  Procedures: None  CONSULTS:  cardiology  Time spent: 25 minutes-Greater than 50% of this time was spent in counseling, explanation of diagnosis, planning of further management, and coordination of care.  MEDICATIONS: Anti-infectives    Start     Dose/Rate Route Frequency Ordered Stop   10/13/15 1200  [MAR Hold]  doxycycline (VIBRA-TABS) tablet 100 mg     (MAR Hold since 10/14/15 1029)   100 mg Oral Every 12 hours 10/13/15 1126        Scheduled Meds: . [MAR Hold] arformoterol  15 mcg Nebulization BID  . [MAR Hold] aspirin EC  325 mg Oral Daily  . [MAR Hold] atorvastatin  40 mg Oral q1800  . [MAR Hold] budesonide (PULMICORT) nebulizer solution  0.25 mg Nebulization BID  . [MAR Hold] doxycycline  100 mg Oral Q12H  . [MAR Hold] fluticasone  2 spray Each Nare Daily  . [MAR Hold] folic acid  1 mg Oral Daily  . [MAR Hold] guaiFENesin  600 mg Oral BID  . [MAR Hold] heparin  5,000 Units Subcutaneous Q8H  . [MAR Hold] isosorbide mononitrate  30 mg Oral Daily  . [MAR Hold] loratadine  10 mg Oral Daily  . [MAR Hold] methylPREDNISolone (SOLU-MEDROL) injection  40 mg Intravenous Q12H  . [MAR Hold] pantoprazole  40 mg Oral Daily  . [MAR Hold] sodium chloride flush  3 mL Intravenous Q12H   Continuous Infusions: . sodium chloride 400 mL (10/14/15 1125)   PRN Meds:.[MAR Hold] acetaminophen, [MAR Hold] albuterol, [MAR Hold] chlorpheniramine-HYDROcodone, [MAR Hold] hydrALAZINE, [MAR  Hold]  HYDROmorphone (DILAUDID) injection   PHYSICAL EXAM: Vital signs: Filed Vitals:   10/14/15 1107 10/14/15 1120 10/14/15 1135 10/14/15 1150  BP: 159/95 128/95 145/86 159/95  Pulse: 63 55 68 61  Temp:      TempSrc:      Resp: 15 23 21 21   Height:      Weight:      SpO2: 96% 95% 95% 96%   Filed Weights   10/12/15 1700  Weight: 99.791  kg (220 lb)   Body mass index is 31.57 kg/(m^2).   Gen Exam: Awake and alert with clear speech. Not in any distress  Neck: Supple, No JVD.   Chest: Good air entry bilaterally with a few scattered rhonchi. CVS: S1 S2 Regular, no murmurs.  Abdomen: soft, BS +, non tender, non distended.  Extremities: no edema, lower extremities warm to touch. Neurologic: Non Focal.   Skin: No Rash or lesions   Wounds: N/A.   LABORATORY DATA: CBC:  Recent Labs Lab 10/08/15 1703 10/09/15 0211 10/11/15 0200 10/12/15 1316 10/13/15 0635  WBC 8.3 9.4 11.3* 8.2 7.0  NEUTROABS 6.1  --   --  4.6  --   HGB 15.6 14.3 14.6 16.2 15.4  HCT 45.2 41.9 43.0 44.1 43.1  MCV 103.2* 103.7* 104.9* 100.9* 102.4*  PLT 198 183 242 256 [id number]    Basic Metabolic Panel:  Recent Labs Lab 10/08/15 1703 10/09/15 0211 10/11/15 0200 10/12/15 1316 10/13/15 0635  NA 138 135 137 138 134*  K 3.7 3.9 4.0 3.5 3.6  CL 100* 100* 97* 101 98*  CO2 30 28 34* 32 29  GLUCOSE 174* 139* 153* 121* 124*  BUN 7 6 11 17 19   CREATININE 0.97 0.91 0.73 0.92 0.84  CALCIUM 9.1 8.3* 8.5* 8.5* 8.0*    GFR: Estimated Creatinine Clearance: 123.2 mL/min (by C-G formula based on Cr of 0.84).  Liver Function Tests:  Recent Labs Lab 10/08/15 1703 10/13/15 1117  AST 39 76*  ALT 59 135*  ALKPHOS 60 62  BILITOT 1.2 0.9  PROT 7.5 6.9  ALBUMIN 3.2* 3.3*   No results for input(s): LIPASE, AMYLASE in the last 168 hours. No results for input(s): AMMONIA in the last 168 hours.  Coagulation Profile:  Recent Labs Lab 10/08/15 1703  INR 1.12    Cardiac Enzymes:  Recent Labs Lab 10/09/15 1327 10/12/15 1316 10/12/15 1845 10/13/15 0019 10/13/15 0635 10/13/15 1122  CKTOTAL  --   --   --   --   --  12*  CKMB  --   --   --   --   --  4.3  TROPONINI 0.04* 0.04* 0.04* 0.05* 0.04*  --     BNP (last 3 results) No results for input(s): PROBNP in the last 8760 hours.  HbA1C:  Recent Labs  10/12/15 1845  HGBA1C 6.3*     CBG: No results for input(s): GLUCAP in the last 168 hours.  Lipid Profile: No results for input(s): CHOL, HDL, LDLCALC, TRIG, CHOLHDL, LDLDIRECT in the last 72 hours.  Thyroid Function Tests:  Recent Labs  10/12/15 1845  TSH 1.820    Anemia Panel: No results for input(s): VITAMINB12, FOLATE, FERRITIN, TIBC, IRON, RETICCTPCT in the last 72 hours.  Urine analysis:    Component Value Date/Time   COLORURINE AMBER* 10/08/2015 1848   APPEARANCEUR CLOUDY* 10/08/2015 1848   LABSPEC 1.029 10/08/2015 1848   PHURINE 6.0 10/08/2015 1848   GLUCOSEU 500* 10/08/2015 1848   HGBUR SMALL* 10/08/2015 1848  BILIRUBINUR SMALL* 10/08/2015 [place] 10/08/2015 1848   PROTEINUR NEGATIVE 10/08/2015 1848   UROBILINOGEN 1.0 10/16/2010 0217   NITRITE NEGATIVE 10/08/2015 1848   LEUKOCYTESUR NEGATIVE 10/08/2015 1848    Sepsis Labs: Lactic Acid, Venous No results found for: LATICACIDVEN  MICROBIOLOGY: Recent Results (from the past 240 hour(s))  MRSA PCR Screening     Status: None   Collection Time: 10/09/15  1:20 AM  Result Value Ref Range Status   MRSA by PCR NEGATIVE NEGATIVE Final    Comment:        The GeneXpert MRSA Assay (FDA approved for NASAL specimens only), is one component of a comprehensive MRSA colonization surveillance program. It is not intended to diagnose MRSA infection nor to guide or monitor treatment for MRSA infections.     RADIOLOGY STUDIES/RESULTS: Dg Chest 2 View  10/12/2015  CLINICAL DATA:  50 year old male with mid chest pain, shortness of breath and productive cough. Initial encounter. Former smoker. EXAM: CHEST  2 VIEW COMPARISON:  07/22/2015 and earlier. FINDINGS: Stable lung volumes. Stable cardiac size at the upper limits of normal to mildly enlarged. Other mediastinal contours are within normal limits. Visualized tracheal air column is within normal limits. No pneumothorax or pulmonary edema. No pleural effusion or acute pulmonary  opacity. Stable chronic increased interstitial markings. No acute osseous abnormality identified. IMPRESSION: No acute cardiopulmonary abnormality. Electronically Signed   By: Genevie Ann M.D.   On: 10/12/2015 13:05   Dg Chest 2 View  10/08/2015  CLINICAL DATA:  50 year old with current history of COPD presenting with 2 day history of shortness of breath and productive cough. EXAM: CHEST  2 VIEW COMPARISON:  None. FINDINGS: Cardiac silhouette normal in size. Thoracic aorta mildly atherosclerotic and tortuous. Hilar and mediastinal contours otherwise unremarkable. Prominent bronchovascular markings diffusely and moderate central peribronchial thickening. Lungs otherwise clear. No localized airspace consolidation. No pleural effusions. No pneumothorax. Normal pulmonary vascularity. Thoracolumbar dextroscoliosis and degenerative changes throughout the thoracic spine. IMPRESSION: Moderate changes of bronchitis and/or asthma without focal airspace pneumonia. Electronically Signed   By: Evangeline Dakin M.D.   On: 10/08/2015 17:44   Dg Wrist Complete Left  10/08/2015  CLINICAL DATA:  50 year old who fell and injured the left wrist. Medial pain. Initial encounter. EXAM: LEFT WRIST - COMPLETE 3+ VIEW COMPARISON:  None. FINDINGS: No evidence of acute fracture or dislocation. Remote healed fracture involving the distal ulnar metaphysis. Prior ORIF of a fracture involving the base of the 1st metacarpal with normal healing. Mild radiocarpal and mild intercarpal joint space narrowing. Moderate trapezium-1st metacarpal joint space narrowing. IMPRESSION: 1. No acute osseous abnormality. 2. Osteoarthritis. 3. Remote healed fracture involving the distal ulnar metaphysis. Prior ORIF of a fracture involving the base of the 1st metacarpal with normal healing. Electronically Signed   By: Evangeline Dakin M.D.   On: 10/08/2015 17:46     LOS: 1 day   Oren Binet, MD  [facility] Pager:[phone]  If 7PM-7AM, please  contact night-coverage [email or web address] Password [email or web address] 10/14/2015, 1:17 PM

## 2015-10-14 NOTE — Progress Notes (Signed)
Pt transferred to cath lab per MD

## 2015-10-14 NOTE — H&P (View-Only) (Signed)
Patient Name: [name] Date of Encounter: 10/14/2015  Principal Problem:   SOB (shortness of breath) Active Problems:   Troponin level elevated   Chest pain   Cocaine abuse   Primary Cardiologist: new, Dr Debara Pickett  Patient Profile: 50 y.o. year old male with a history of polysubstance abuse (tobacco, ETOH, cocaine), no previous cardiac history, no known HTN/DM/HLD but no recent checkups was admitted 05/24 for chest pain, MV planned.   SUBJECTIVE: C/o 10/10 hand pain (from when he had cough-syncope and fell prior to the previous admit) and 4/10 chest pain (continuous). No SOB  OBJECTIVE Filed Vitals:   10/13/15 2034 10/14/15 0618 10/14/15 0733 10/14/15 0734  BP: 150/94 111/76    Pulse: 61 58    Temp: 97 F (36.1 C) 98.6 F (37 C)    TempSrc: Oral Oral    Resp: 18 18    Height:      Weight:      SpO2: 98% 96% 95% 95%    Intake/Output Summary (Last 24 hours) at 10/14/15 0739 Last data filed at 10/13/15 1006  Gross per 24 hour  Intake      3 ml  Output      0 ml  Net      3 ml   Filed Weights   10/12/15 1700  Weight: 220 lb (99.791 kg)    PHYSICAL EXAM General: Well developed, well nourished, male in no acute distress. Head: Normocephalic, atraumatic.  Neck: Supple without bruits, JVD not elevated Lungs:  Resp regular and unlabored, decreased BS bases Heart: RRR, S1, S2, no S3, S4, or murmur; no rub. Abdomen: Soft, non-tender, non-distended, BS + x 4.  Extremities: No clubbing, cyanosis, edema.  Neuro: Alert and oriented X 3. Moves all extremities spontaneously. Psych: Normal affect.  LABS: CBC: Recent Labs  10/12/15 1316 10/13/15 0635  WBC 8.2 7.0  NEUTROABS 4.6  --   HGB 16.2 15.4  HCT 44.1 43.1  MCV 100.9* 102.4*  PLT 256 [id number]   Basic Metabolic Panel: Recent Labs  10/12/15 1316 10/13/15 0635  NA 138 134*  K 3.5 3.6  CL 101 98*  CO2 32 29  GLUCOSE 121* 124*  BUN 17 19  CREATININE 0.92 0.84  CALCIUM 8.5* 8.0*   Liver  Function Tests: Recent Labs  10/13/15 1117  AST 76*  ALT 135*  ALKPHOS 62  BILITOT 0.9  PROT 6.9  ALBUMIN 3.3*   Cardiac Enzymes: Recent Labs  10/12/15 1845 10/13/15 0019 10/13/15 0635 10/13/15 1122  CKTOTAL  --   --   --  12*  CKMB  --   --   --  4.3  TROPONINI 0.04* 0.05* 0.04*  --    BNP:  B NATRIURETIC PEPTIDE  Date/Time Value Ref Range Status  10/12/2015 06:45 PM 159.2* 0.0 - 100.0 pg/mL Final   Hemoglobin A1C: Recent Labs  10/12/15 1845  HGBA1C 6.3*   Thyroid Function Tests: Recent Labs  10/12/15 1845  TSH 1.820   Drugs of Abuse     Component Value Date/Time   LABOPIA POSITIVE* 10/13/2015 1213   COCAINSCRNUR NONE DETECTED 10/13/2015 1213   LABBENZ NONE DETECTED 10/13/2015 1213   AMPHETMU NONE DETECTED 10/13/2015 1213   THCU NONE DETECTED 10/13/2015 1213   LABBARB NONE DETECTED 10/13/2015 1213     TELE:  SR, S brady occ 30s, mostly high 40s and 50s. PR is narrow at times, occ junctional escape beats    Radiology/Studies: Dg Chest 2 View  10/12/2015  CLINICAL DATA:  50 year old male with mid chest pain, shortness of breath and productive cough. Initial encounter. Former smoker. EXAM: CHEST  2 VIEW COMPARISON:  07/22/2015 and earlier. FINDINGS: Stable lung volumes. Stable cardiac size at the upper limits of normal to mildly enlarged. Other mediastinal contours are within normal limits. Visualized tracheal air column is within normal limits. No pneumothorax or pulmonary edema. No pleural effusion or acute pulmonary opacity. Stable chronic increased interstitial markings. No acute osseous abnormality identified. IMPRESSION: No acute cardiopulmonary abnormality. Electronically Signed   By: Genevie Ann M.D.   On: 10/12/2015 13:05     Current Medications:  . arformoterol  15 mcg Nebulization BID  . aspirin EC  325 mg Oral Daily  . atorvastatin  40 mg Oral q1800  . budesonide (PULMICORT) nebulizer solution  0.25 mg Nebulization BID  . doxycycline  100 mg Oral  Q12H  . fluticasone  2 spray Each Nare Daily  . folic acid  1 mg Oral Daily  . guaiFENesin  600 mg Oral BID  . heparin  5,000 Units Subcutaneous Q8H  . isosorbide mononitrate  30 mg Oral Daily  . loratadine  10 mg Oral Daily  . methylPREDNISolone (SOLU-MEDROL) injection  40 mg Intravenous Q12H  . pantoprazole  40 mg Oral Daily  . sodium chloride flush  3 mL Intravenous Q12H      ASSESSMENT AND PLAN: Principal Problem: 1. SOB (shortness of breath) - unclear cause - he coughs at times, but CXR not abnl and BNP not very high - recent admit for COPD exacerbation and ?PNA - per IM  Active Problems: 2. Troponin level elevated 3. Chest pain - sx are not typical or exertional - minimal elevation in troponin not convincing for ACS - CK low, MB normal, LFTs with mildly elevated transaminases - Echo performed, results pending - MV today   Cocaine abuse - UDS neg for cocaine now, it was + under other MRN on 05/20  Jonetta Speak , PA-C 7:39 AM 10/14/2015

## 2015-10-14 NOTE — Progress Notes (Signed)
Pt received from [facility]- pt c/o severe CP few min after ariving. PA at bedside, medicating with NTG SL; noneffective, NS stable, o2 2L on. Dr Debara Pickett, cardiology called to come see pt. Received 2 NTG SL

## 2015-10-14 NOTE — Progress Notes (Signed)
TR Band removed at 1300. Gauze and tegaderm dressing applied. Reinforced restriction. Up to void in bath room. EMT here for transport to [facility]

## 2015-10-15 LAB — BASIC METABOLIC PANEL
ANION GAP: 6 (ref 5–15)
BUN: 17 mg/dL (ref 6–20)
CO2: 31 mmol/L (ref 22–32)
Calcium: 8.6 mg/dL — ABNORMAL LOW (ref 8.9–10.3)
Chloride: 96 mmol/L — ABNORMAL LOW (ref 101–111)
Creatinine, Ser: 0.84 mg/dL (ref 0.61–1.24)
Glucose, Bld: 228 mg/dL — ABNORMAL HIGH (ref 65–99)
POTASSIUM: 4.4 mmol/L (ref 3.5–5.1)
SODIUM: 133 mmol/L — AB (ref 135–145)

## 2015-10-15 LAB — CBC
HCT: 46.8 % (ref 39.0–52.0)
Hemoglobin: 16.8 g/dL (ref 13.0–17.0)
MCH: 36.7 pg — ABNORMAL HIGH (ref 26.0–34.0)
MCHC: 35.9 g/dL (ref 30.0–36.0)
MCV: 102.2 fL — ABNORMAL HIGH (ref 78.0–100.0)
PLATELETS: 309 10*3/uL (ref 150–400)
RBC: 4.58 MIL/uL (ref 4.22–5.81)
RDW: 12.9 % (ref 11.5–15.5)
WBC: 13.5 10*3/uL — AB (ref 4.0–10.5)

## 2015-10-15 NOTE — Discharge Summary (Signed)
Patient left AMA at 5.30am 10-15-15   I was to assume care on 10-15-15 at7 am, when I came to round at 7am he was already gone, kindly see the RN note below.   " At [place] pt asked if he could walk in the halls, RN informed pt that he could as long as he stayed on the 4th floor. At 0545 CMT called this RN saying all leads off the pt. This RN went in pt room to replace leads, as this had happened many times during the shift, and found that pt was not in the room and pt belongings missing. This RN searched 4th floor for pt, unsuccessful at finding pt. Security and Joe, [facility] notified at this time. Pt left with tele box and IV intact. NP made aware. "    Signature  Lala Lund K M.D on 10/15/2015 at 7:35 AM  Between 7am to 7pm - Pager - [phone], After 7pm go to [email or web address] - password [facility]  [facility]  - Office  [phone]

## 2015-10-15 NOTE — Progress Notes (Addendum)
At 0530 pt asked if he could walk in the halls, RN informed pt that he could as long as he stayed on the 4th floor. At 0545 CMT called this RN saying all leads off the pt. This RN went in pt room to replace leads, as this had happened many times during the shift, and found that pt was not in the room and pt belongings missing. This RN searched 4th floor for pt, unsuccessful at finding pt. Security and Joe, [facility] notified at this time. Pt left with tele box and IV intact. NP made aware.

## 2015-10-16 ENCOUNTER — Emergency Department (HOSPITAL_COMMUNITY)
Admission: EM | Admit: 2015-10-16 | Discharge: 2015-10-17 | Disposition: A | Discharge status: Home / Self Care | Payer: No Typology Code available for payment source | Attending: Emergency Medicine | Admitting: Emergency Medicine

## 2015-10-16 ENCOUNTER — Encounter (HOSPITAL_COMMUNITY): Payer: Self-pay | Admitting: Family Medicine

## 2015-10-16 DIAGNOSIS — R740 Nonspecific elevation of levels of transaminase and lactic acid dehydrogenase [LDH]: Secondary | ICD-10-CM | POA: Insufficient documentation

## 2015-10-16 DIAGNOSIS — R7401 Elevation of levels of liver transaminase levels: Secondary | ICD-10-CM

## 2015-10-16 DIAGNOSIS — J449 Chronic obstructive pulmonary disease, unspecified: Secondary | ICD-10-CM | POA: Insufficient documentation

## 2015-10-16 DIAGNOSIS — Z792 Long term (current) use of antibiotics: Secondary | ICD-10-CM | POA: Insufficient documentation

## 2015-10-16 DIAGNOSIS — Z7952 Long term (current) use of systemic steroids: Secondary | ICD-10-CM | POA: Insufficient documentation

## 2015-10-16 DIAGNOSIS — Z87891 Personal history of nicotine dependence: Secondary | ICD-10-CM | POA: Insufficient documentation

## 2015-10-16 DIAGNOSIS — F1410 Cocaine abuse, uncomplicated: Secondary | ICD-10-CM | POA: Insufficient documentation

## 2015-10-16 DIAGNOSIS — F1010 Alcohol abuse, uncomplicated: Secondary | ICD-10-CM | POA: Insufficient documentation

## 2015-10-16 DIAGNOSIS — Z79899 Other long term (current) drug therapy: Secondary | ICD-10-CM | POA: Insufficient documentation

## 2015-10-16 LAB — RAPID URINE DRUG SCREEN, HOSP PERFORMED
AMPHETAMINES: NOT DETECTED
Barbiturates: NOT DETECTED
Benzodiazepines: NOT DETECTED
Cocaine: POSITIVE — AB
OPIATES: NOT DETECTED
Tetrahydrocannabinol: NOT DETECTED

## 2015-10-16 LAB — COMPREHENSIVE METABOLIC PANEL
ALBUMIN: 3.4 g/dL — AB (ref 3.5–5.0)
ALK PHOS: 64 U/L (ref 38–126)
ALT: 354 U/L — AB (ref 17–63)
ANION GAP: 7 (ref 5–15)
AST: 218 U/L — AB (ref 15–41)
BILIRUBIN TOTAL: 0.9 mg/dL (ref 0.3–1.2)
BUN: 19 mg/dL (ref 6–20)
CALCIUM: 8.7 mg/dL — AB (ref 8.9–10.3)
CO2: 30 mmol/L (ref 22–32)
CREATININE: 1.02 mg/dL (ref 0.61–1.24)
Chloride: 101 mmol/L (ref 101–111)
GFR calc Af Amer: >60 mL/min (ref >60)
GFR calc non Af Amer: >60 mL/min (ref >60)
GLUCOSE: 108 mg/dL — AB (ref 65–99)
Potassium: 3.7 mmol/L (ref 3.5–5.1)
SODIUM: 138 mmol/L (ref 135–145)
TOTAL PROTEIN: 6.5 g/dL (ref 6.5–8.1)

## 2015-10-16 LAB — CBC
HCT: 41.9 % (ref 39.0–52.0)
Hemoglobin: 15.2 g/dL (ref 13.0–17.0)
MCH: 36.5 pg — AB (ref 26.0–34.0)
MCHC: 36.3 g/dL — AB (ref 30.0–36.0)
MCV: 100.7 fL — AB (ref 78.0–100.0)
PLATELETS: 228 10*3/uL (ref 150–400)
RBC: 4.16 MIL/uL — AB (ref 4.22–5.81)
RDW: 12.9 % (ref 11.5–15.5)
WBC: 6.9 10*3/uL (ref 4.0–10.5)

## 2015-10-16 LAB — ETHANOL: Alcohol, Ethyl (B): <5 mg/dL (ref <5)

## 2015-10-16 NOTE — ED Notes (Signed)
Patient states was released on Friday for PNA but wants to detox from ETOH and cocaine. Last drink: 1 hour ago and had "been drinking all day". Last use of cocaine: 3-4 hours ago and had 1 gram. Denies SI and HI.

## 2015-10-17 NOTE — ED Provider Notes (Signed)
CSN: [id number]     Arrival date & time 10/16/15  2127 History  By signing my name below, I, Altamease Oiler, attest that this documentation has been prepared under the direction and in the presence of Delora Fuel, MD. Electronically Signed: Altamease Oiler, ED Scribe. 10/17/2015. 12:48 AM   Chief Complaint  Patient presents with  . Alcohol Problem    The history is provided by the patient. No language interpreter was used.   Dashel Dalson Pizarro is a 50 y.o. male who presents to the Emergency Department for alcohol and cocaine detox. Pt is unable to quantify his alcohol use and states that he "just wake[s] up in the morning and start[s] drinking". His last drink was just over 4 hours ago. Pt states that he has had DTs with previous periods of abstinence.  He last used cocaine 5 hours ago. Pt  denies depression, SI, HI, and audio or visual hallucinations. In the past he has had treatment at [facility].  Past Medical History  Diagnosis Date  . COPD (chronic obstructive pulmonary disease) ([place])   . Panic attacks   . Tobacco abuse    Past Surgical History  Procedure Laterality Date  . Hand surgery    . Shoulder surgery    . Rotator cuff repair Right   . Orif metacarpal fracture Left 2011  . Foot surgery Right   . Cardiac catheterization N/A 10/14/2015    Procedure: Left Heart Cath and Coronary Angiography;  Surgeon: Sherren Mocha, MD;  Location: [facility];  Service: Cardiovascular;  Laterality: N/A;   Family History  Problem Relation Age of Onset  . Cancer Other   . Stroke Other   . Coronary artery disease Other   . Hypertension Mother    Social History  Substance Use Topics  . Smoking status: Former Smoker -- 1.00 packs/day    Types: Cigarettes    Quit date: 09/22/2015  . Smokeless tobacco: Never Used  . Alcohol Use: Yes     Comment: Daily. Heavy.     Review of Systems  Psychiatric/Behavioral: Positive for behavioral problems. Negative for suicidal ideas  and hallucinations.  All other systems reviewed and are negative.   Allergies  Penicillins and Penicillins  Home Medications   Prior to Admission medications   Medication Sig Start Date End Date Taking? Authorizing Provider  albuterol (PROVENTIL HFA;VENTOLIN HFA) 108 (90 Base) MCG/ACT inhaler Inhale 2 puffs into the lungs every 6 (six) hours as needed for wheezing or shortness of breath. 10/11/15   Costin Karlyne Greenspan, MD  azithromycin (ZITHROMAX Z-PAK) 250 MG tablet 2 po day one, then 1 daily x 4 days Patient not taking: Reported on 10/12/2015 07/22/15   Elnora Morrison, MD  guaiFENesin-dextromethorphan (ROBITUSSIN DM) 100-10 MG/5ML syrup Take 5 mLs by mouth every 4 (four) hours as needed for cough. 10/11/15   Costin Karlyne Greenspan, MD  levofloxacin (LEVAQUIN) 750 MG tablet Take 1 tablet (750 mg total) by mouth daily. 10/11/15   Costin Karlyne Greenspan, MD  nicotine (EQ NICOTINE) 21 mg/24hr patch Place 1 patch (21 mg total) onto the skin daily. 10/11/15   Costin Karlyne Greenspan, MD  predniSONE (DELTASONE) 20 MG tablet Take 2 tablets (40 mg total) by mouth daily with breakfast. 40 mg x 3 days then 20 mg x 3 days 10/11/15   Caren Griffins, MD   BP 129/86 mmHg  Pulse 64  Temp(Src) 98.2 F (36.8 C) (Oral)  Resp 20  Ht 5\' 8"  (1.727 m)  Wt  222 lb (100.699 kg)  BMI 33.76 kg/m2  SpO2 94% Physical Exam  Constitutional: He is oriented to person, place, and time. He appears well-developed and well-nourished.  HENT:  Head: Normocephalic and atraumatic.  Eyes: EOM are normal. Pupils are equal, round, and reactive to light.  Neck: Normal range of motion. Neck supple. No JVD present.  Cardiovascular: Normal rate, regular rhythm and normal heart sounds.   No murmur heard. Pulmonary/Chest: Effort normal and breath sounds normal. He has no wheezes. He has no rales. He exhibits no tenderness.  Abdominal: Soft. Bowel sounds are normal. He exhibits no distension and no mass. There is no tenderness.  Musculoskeletal: Normal  range of motion. He exhibits no edema.  Lymphadenopathy:    He has no cervical adenopathy.  Neurological: He is alert and oriented to person, place, and time. No cranial nerve deficit. He exhibits normal muscle tone. Coordination normal.  No tremulousness   Skin: Skin is warm and dry. No rash noted.  Nursing note and vitals reviewed.   ED Course  Procedures (including critical care time) DIAGNOSTIC STUDIES: Oxygen Saturation is 94% on RA,  normal by my interpretation.    COORDINATION OF CARE: 12:46 AM Discussed treatment plan which includes lab work and outpatient referral with pt at bedside and pt agreed to plan.  Labs Review Results for orders placed or performed during the hospital encounter of 10/16/15  Comprehensive metabolic panel  Result Value Ref Range   Sodium 138 135 - 145 mmol/L   Potassium 3.7 3.5 - 5.1 mmol/L   Chloride 101 101 - 111 mmol/L   CO2 30 22 - 32 mmol/L   Glucose, Bld 108 (H) 65 - 99 mg/dL   BUN 19 6 - 20 mg/dL   Creatinine, Ser 1.02 0.61 - 1.24 mg/dL   Calcium 8.7 (L) 8.9 - 10.3 mg/dL   Total Protein 6.5 6.5 - 8.1 g/dL   Albumin 3.4 (L) 3.5 - 5.0 g/dL   AST 218 (H) 15 - 41 U/L   ALT 354 (H) 17 - 63 U/L   Alkaline Phosphatase 64 38 - 126 U/L   Total Bilirubin 0.9 0.3 - 1.2 mg/dL   GFR calc non Af Amer >60 >60 mL/min   GFR calc Af Amer >60 >60 mL/min   Anion gap 7 5 - 15  Ethanol  Result Value Ref Range   Alcohol, Ethyl (B) <5 <5 mg/dL  cbc  Result Value Ref Range   WBC 6.9 4.0 - 10.5 K/uL   RBC 4.16 (L) 4.22 - 5.81 MIL/uL   Hemoglobin 15.2 13.0 - 17.0 g/dL   HCT 41.9 39.0 - 52.0 %   MCV 100.7 (H) 78.0 - 100.0 fL   MCH 36.5 (H) 26.0 - 34.0 pg   MCHC 36.3 (H) 30.0 - 36.0 g/dL   RDW 12.9 11.5 - 15.5 %   Platelets 228 150 - 400 K/uL  Rapid urine drug screen (hospital performed)  Result Value Ref Range   Opiates NONE DETECTED NONE DETECTED   Cocaine POSITIVE (A) NONE DETECTED   Benzodiazepines NONE DETECTED NONE DETECTED   Amphetamines NONE  DETECTED NONE DETECTED   Tetrahydrocannabinol NONE DETECTED NONE DETECTED   Barbiturates NONE DETECTED NONE DETECTED   I have personally reviewed and evaluated these lab results as part of my medical decision-making.   MDM   Final diagnoses:  Alcohol abuse  Cocaine abuse  Elevated transaminase level    Patient presenting requesting evaluation for inpatient detox from alcohol and cocaine. Review  of old records shows a similar ED visits several years ago. Patient claims history of DTs, but he is showing no evidence of tremor in spite of an alcohol of 0. It was explained to the patient that alcohol and cocaine and no longer managed inpatient at this facility. He is given referrals for outpatient facilities and inpatient facilities.  I personally performed the services described in this documentation, which was scribed in my presence. The recorded information has been reviewed and is accurate.      Delora Fuel, MD [id number]

## 2015-10-17 NOTE — Discharge Instructions (Signed)
Substance Abuse Treatment Programs ° °Intensive Outpatient Programs °[facility]     °[address]      °[place], [place]                   °[phone]      ° °[facility] °[address] °[place], [place] °[phone] ° °[facility]     °(Inpatient and outpatient)     °[address]           °[phone]   ° °[facility] °336-288-1484 (Suboxone and Methadone) ° °[address]      °[place], [place] [address]      °336-882-2125      ° °[address] °[place], [place] °852-3033 ° °[facility] (Outpatient/Inpatient, [facility])    °(insurance only) [phone]      °       °[facility] (Groups & Residential) °[place], [place] °336-389-1413 ° °   °[facility]     °[address]     °[place], [place]      °336-389-1413      ° °[facility] (for caregivers and family) °[address] °[place] °336-299-4655 ° ° ° ° ° °Residential Treatment Programs °[facility]      °[address], [place], [place] [address]  °([phone]      ° °[facility] °[address] [place], [place] [address] °[phone] ° °[facility]        °336-248-8914      ° °[facility] °1-800-659-3381 ° °[facility]             °[address]                                         °[place], [place]                                                °877-615-2722 or 336-784-9470                              ° °[facility] [place] °[address] °[place], [address] °1.877.941.8954 ° °[facility]    °[address]      °[place], [place]     °336-273-5306      ° °[facility] °[address] °[place], [place] °336-285-9073 ° °[facility]   °[address]     °[place], [place] [address]     °336-899-1550      °Admissions: 8am-3pm M-F ° °[facility] °[address] °[place] °336-227-7417 ° °[facility]: Residential Program (90 Days)   °[place], [place]      °336-725-8389 or 800-758-6077    ° °[facility] °[place], [place] °(Walk in Hours over the weekend or by referral) ° °[facility] °[address], [place], [place] [address] °(336) [phone] ° °Crisis Mobile: [facility]:  1-877-626-1772 (for crisis response 24 hours a day) °[facility] Hotline:      1-800-256-2452 °Outpatient Psychiatry and Counseling ° °[facility]: Mobile Crisis   Management 24 hours:  1-877-626-1772 ° °[facility] sliding scale fee and walk in schedule: M-F 8am-12pm/1pm-3pm °[address]  °[place], [place] [address] °[phone] ° °[facility] °[address] °[place], [place] [address] °336-703-9650 ° °[facility] (Formerly known [facility])- new patient walk-in appointments available Monday - Friday 8am -3pm.          °[address] °[place], [place] [address] °336-676-6840 or crisis line- 336-676-6905 ° °[facility] Intensive Outpatient Therapy Program °[address] °[place], [place] [address] °336-832-9804 ° °[facility]                  °Crisis Services      °336.641.4993      °[address]     °[place], [place] [address]                ° °[facility]   °[facility] °[phone] °[address] °[place], [place] [address] ° ° °[facility]          °[address]  °[place], [place] [address]       °(336) [phone] ° °[facility] °[address] °[place], [place] [address] °336-292-1510 ° °[facility]    °[address]    °[place], [place] [address]     °336-632-3505      ° °Parish McKinney, MD     °[address]     °[place] [address]     °336-282-1251     °  °[facility] °[address] °[place] [address] ° °[facility]     °[address]     °[place], [place]      °336-542-2076      ° °[facility] °Shamsher Ahluwalia, MD °[address] °[place], [place] [address] °[phone] ° °[facility]     °[address]     °[place], [place] [address]     °336-274-1237      ° °[facility] °[address] °[place], [place] [address] °336-854-4450 °Resources for Temporary Residential Assistance/Crisis Centers ° °DAY CENTERS °[facility] °M-F 8am-3pm   °[address] [place], [place] [address]   336-332-0824 °Services include: laundry, barbering, support groups, case management, phone  & computer access, showers, AA/NA mtgs, mental health/substance abuse nurse, job skills class, disability information, VA assistance, spiritual classes, etc.  ° °HOMELESS SHELTERS ° °[facility]     °[facility]   °[address], [place]     °[phone]       °       °[facility] (women and children)       °[address] °[place], [place] [address] °336-275-0820 °[email or web address] for application and process °Application Required ° °[facility]   °[address]    °[place] [address]     °[phone]       °             °[facility] °[address] °, [place] [address] °336.273.5572 °336-235-0363(schedule application appt.) °Application Required ° °[facility] (women only)    °[address]     °[place],  [address]     °336-884-1039      °  Intake starts 6pm daily °Need valid ID, SSC, & Police report °[facility] [place] °[address] °[place], [place] °336-881-5420 °Application Required ° °[facility] (men only)     °[address]      °[place], [place]     °[phone]      ° °[facility] °(Pregnant women only) °[address] °[place], [place] °336-275-0206 ° °[facility]      °[address]      °[place], [place] [address]     °336-722-9951      °       °[facility] °[address] °[place], [place] °336-723-1848 °90 day commitment/SA/Application process ° °[facility](men only)     °[address]     °[place], [place]     °[phone]       °Check-in at 7pm     °       °[facility] °[address] °[place], [place] [address] °336-248-6684 °Men/Women/Women and Children must be there by 7 pm ° °[facility] °[place], [place] °336-722-8721                ° °

## 2017-01-28 ENCOUNTER — Emergency Department (HOSPITAL_COMMUNITY)
Admission: EM | Admit: 2017-01-28 | Discharge: 2017-01-28 | Disposition: A | Discharge status: Home / Self Care | Attending: Emergency Medicine | Admitting: Emergency Medicine

## 2017-01-28 ENCOUNTER — Encounter (HOSPITAL_COMMUNITY): Payer: Self-pay | Admitting: Emergency Medicine

## 2017-01-28 DIAGNOSIS — X58XXXA Exposure to other specified factors, initial encounter: Secondary | ICD-10-CM | POA: Insufficient documentation

## 2017-01-28 DIAGNOSIS — Y9389 Activity, other specified: Secondary | ICD-10-CM | POA: Insufficient documentation

## 2017-01-28 DIAGNOSIS — Z79899 Other long term (current) drug therapy: Secondary | ICD-10-CM | POA: Insufficient documentation

## 2017-01-28 DIAGNOSIS — F17210 Nicotine dependence, cigarettes, uncomplicated: Secondary | ICD-10-CM | POA: Insufficient documentation

## 2017-01-28 DIAGNOSIS — J449 Chronic obstructive pulmonary disease, unspecified: Secondary | ICD-10-CM | POA: Insufficient documentation

## 2017-01-28 DIAGNOSIS — S39012A Strain of muscle, fascia and tendon of lower back, initial encounter: Secondary | ICD-10-CM | POA: Insufficient documentation

## 2017-01-28 DIAGNOSIS — Y929 Unspecified place or not applicable: Secondary | ICD-10-CM | POA: Insufficient documentation

## 2017-01-28 DIAGNOSIS — Y999 Unspecified external cause status: Secondary | ICD-10-CM | POA: Insufficient documentation

## 2017-01-28 MED ORDER — METHOCARBAMOL 500 MG PO TABS: 500.0000 mg | ORAL_TABLET | Freq: Two times a day (BID) | ORAL | 0 refills | Status: DC | PRN

## 2017-01-28 MED ORDER — IBUPROFEN 600 MG PO TABS: 600.0000 mg | ORAL_TABLET | Freq: Three times a day (TID) | ORAL | 0 refills | Status: DC

## 2017-01-28 MED ORDER — KETOROLAC TROMETHAMINE 15 MG/ML IJ SOLN
15.0000 mg | Freq: Once | INTRAMUSCULAR | Status: AC
  Administered 2017-01-28: 15 mg via INTRAMUSCULAR
  Filled 2017-01-28: qty 1

## 2017-01-28 MED ORDER — HYDROCODONE-ACETAMINOPHEN 5-325 MG PO TABS
1.0000 | ORAL_TABLET | Freq: Once | ORAL | Status: AC
  Administered 2017-01-28: 1 via ORAL
  Filled 2017-01-28: qty 1

## 2017-01-28 NOTE — Discharge Instructions (Signed)
It is important that you drink lots of water and stay well-hydrated. Take ibuprofen 3 times a day to help with pain and swelling. Do not take other anti-inflammatories at the same time (Advil, Motrin, naproxen, Aleve). You may supplement with Tylenol as needed. You may take Robaxin twice a day as needed for muscle soreness or stiffness. Have caution, as this can some times make people tired. Follow-up with [facility] and wellness in one week if your pain is not improving. Return to the emergency room if you develop fever, chills, loss of bowel or bladder control, numbness, tingling, or any new or worsening symptoms.

## 2017-01-28 NOTE — ED Notes (Signed)
No respiratory or acute distress noted alert and oriented x 3 no bowel or bladder problems voiced call light in reach.

## 2017-01-28 NOTE — ED Provider Notes (Signed)
[facility] DEPT Provider Note   CSN: [id number] Arrival date & time: 01/28/17  0543     History   Chief Complaint Chief Complaint  Patient presents with  . Back Pain    HPI [name] is a 51 y.o. male presenting with low back pain.   Patient states that for the past 3 days, he has been experiencing low back pain. This began while he was driving. It is constant, relieved with lying flat and resting, worsened with movement. He has not tried anything for his pain. He denies fall, trauma, or injury. He denies numbness or tingling. He denies fevers, chills, headache, rash, nausea, vomiting, or loss of bowel or bladder control. He denies history of cancer. Patient reports he uses cocaine, last used one week ago. He reports history of back pain in the past, does not know what it was from, and his pain resolved on its own.   HPI  Past Medical History:  Diagnosis Date  . COPD (chronic obstructive pulmonary disease) ([place])   . Panic attacks   . Tobacco abuse     Patient Active Problem List   Diagnosis Date Noted  . Elevated troponin I level   . SOB (shortness of breath) 10/12/2015  . Troponin level elevated 10/12/2015  . Chest pain 10/12/2015  . Cocaine abuse 10/12/2015  . Cocaine abuse 10/09/2015  . Homeless single person   . Syncope and collapse 10/08/2015  . SOB (shortness of breath) 10/08/2015  . Chest pain 10/08/2015  . COPD (chronic obstructive pulmonary disease) (Home) 10/08/2015  . Acute bronchitis 10/08/2015    Past Surgical History:  Procedure Laterality Date  . CARDIAC CATHETERIZATION N/A 10/14/2015   Procedure: Left Heart Cath and Coronary Angiography;  Surgeon: Sherren Mocha, MD;  Location: [facility];  Service: Cardiovascular;  Laterality: N/A;  . FOOT SURGERY Right   . HAND SURGERY    . ORIF METACARPAL FRACTURE Left 2011  . ROTATOR CUFF REPAIR Right   . SHOULDER SURGERY         Home Medications    Prior to Admission  medications   Medication Sig Start Date End Date Taking? Authorizing Provider  albuterol (PROVENTIL HFA;VENTOLIN HFA) 108 (90 Base) MCG/ACT inhaler Inhale 2 puffs into the lungs every 6 (six) hours as needed for wheezing or shortness of breath. 10/11/15   Gherghe, Vella Redhead, MD  azithromycin (ZITHROMAX Z-PAK) 250 MG tablet 2 po day one, then 1 daily x 4 days Patient not taking: Reported on 10/12/2015 07/22/15   Elnora [place], MD  guaiFENesin-dextromethorphan (ROBITUSSIN DM) 100-10 MG/5ML syrup Take 5 mLs by mouth every 4 (four) hours as needed for cough. 10/11/15   Caren Griffins, MD  ibuprofen (ADVIL,MOTRIN) 600 MG tablet Take 1 tablet (600 mg total) by mouth 3 (three) times daily. 01/28/17   Gabryella Murfin, PA-C  levofloxacin (LEVAQUIN) 750 MG tablet Take 1 tablet (750 mg total) by mouth daily. 10/11/15   Caren Griffins, MD  methocarbamol (ROBAXIN) 500 MG tablet Take 1 tablet (500 mg total) by mouth 2 (two) times daily as needed for muscle spasms. 01/28/17   Emeric Novinger, PA-C  nicotine (EQ NICOTINE) 21 mg/24hr patch Place 1 patch (21 mg total) onto the skin daily. 10/11/15   Caren Griffins, MD  predniSONE (DELTASONE) 20 MG tablet Take 2 tablets (40 mg total) by mouth daily with breakfast. 40 mg x 3 days then 20 mg x 3 days 10/11/15   Caren Griffins, MD  Family History Family History  Problem Relation Age of Onset  . Hypertension Mother   . Cancer Other   . Stroke Other   . Coronary artery disease Other     Social History Social History  Substance Use Topics  . Smoking status: Current Every Day Smoker    Packs/day: 0.50    Types: Cigarettes  . Smokeless tobacco: Never Used  . Alcohol use Yes     Comment: Daily. Heavy.      Allergies   Penicillins and Penicillins   Review of Systems Review of Systems  Constitutional: Negative for chills and fever.  Eyes: Negative for visual disturbance.  Cardiovascular: Negative for chest pain.  Gastrointestinal: Negative for  constipation, diarrhea, nausea and vomiting.  Genitourinary: Negative for dysuria, frequency and hematuria.  Musculoskeletal: Positive for back pain. Negative for neck pain.  Skin: Negative for wound.  Neurological: Negative for numbness.     Physical Exam Updated Vital Signs BP (!) 139/107 (BP Location: Right Arm)   Pulse 68   Temp 98.2 F (36.8 C) (Oral)   Resp (!) 24   Ht 5\' 8"  (1.727 m)   Wt 99.8 kg (220 lb)   SpO2 96%   BMI 33.45 kg/m   Physical Exam  Constitutional: He is oriented to person, place, and time. He appears well-developed and well-nourished. No distress.  HENT:  Head: Normocephalic and atraumatic.  Eyes: Pupils are equal, round, and reactive to light. EOM are normal.  Neck: Normal range of motion.  Cardiovascular: Normal rate, regular rhythm and intact distal pulses.   Pulmonary/Chest: Effort normal and breath sounds normal. No respiratory distress. He has no wheezes.  Abdominal: Soft. He exhibits no distension. There is no tenderness. There is no guarding.  Musculoskeletal: Normal range of motion.  Tenderness to palpation of right lower back musculature. No increased tenderness over vertebrae or spinous process. No tenderness elsewhere in the back. No tenderness in the left side. Pain does not extend into the abdomen. No obvious contusion, laceration, rashes, or deformities. Strength intact 4. Negative straight leg raise. Sensation intact 4. Color and warmth equal. Radial and pedal pulses equal bilaterally. Soft compartments.  Neurological: He is alert and oriented to person, place, and time.  Skin: Skin is warm. No rash noted.  Psychiatric: He has a normal mood and affect.  Nursing note and vitals reviewed.    ED Treatments / Results  Labs (all labs ordered are listed, but only abnormal results are displayed) Labs Reviewed - No data to display  EKG  EKG Interpretation None       Radiology No results found.  Procedures Procedures (including  critical care time)  Medications Ordered in ED Medications  ketorolac (TORADOL) 15 MG/ML injection 15 mg (15 mg Intramuscular Given 01/28/17 0706)  HYDROcodone-acetaminophen (NORCO/VICODIN) 5-325 MG per tablet 1 tablet (1 tablet Oral Given 01/28/17 0735)     Initial Impression / Assessment and Plan / ED Course  I have reviewed the triage vital signs and the nursing notes.  Pertinent labs & imaging results that were available during my care of the patient were reviewed by me and considered in my medical decision making (see chart for details).     Patient presenting with 3 days of lower back pain. No associated symptoms, and no numbness or tingling. Patient neurovascularly intact. Physical exam reassuring, as pain is of the musculature on the right side. Doubt cauda equina, spinal cord injury, or infectious etiology. Discussed with patient. Offered ketorolac shot, and patient  initially refused, stating that it will not help him, and he needs pain medication. Discussed the importance of anti-inflammatories and conservative measures. Patient states he is unable to get back to his car due to pain, but will try ketorolac.  Pain not improved with ketorolac. Will give 1 Norco so patient can get back home and pick up prescriptions. Discussed conservative treatment. Patient to follow-up with [facility] and wellness. At this time, patient appears safe for discharge. Return precautions given. Patient states he understands and agrees to plan.  Final Clinical Impressions(s) / ED Diagnoses   Final diagnoses:  Strain of lumbar region, initial encounter    New Prescriptions Discharge Medication List as of 01/28/2017  7:58 AM    START taking these medications   Details  ibuprofen (ADVIL,MOTRIN) 600 MG tablet Take 1 tablet (600 mg total) by mouth 3 (three) times daily., Starting Mon 01/28/2017, Print    methocarbamol (ROBAXIN) 500 MG tablet Take 1 tablet (500 mg total) by mouth 2 (two) times daily as  needed for muscle spasms., Starting Mon 01/28/2017, Print         [place], [place], PA-C 01/28/17 1723    Merryl Hacker, MD 01/29/17 2252

## 2017-01-28 NOTE — ED Notes (Signed)
Patient was alert, oriented and stable upon discharge. RN went over AVS and patient had no further questions.  Patient was instructed not to drive or operate heavy machinery on narcotic pain medication.   

## 2017-01-28 NOTE — ED Triage Notes (Signed)
Pt presents by EMS for back pain for the last 3 days. Pt unable to remain still. Pt denies radiation to legs or injury to back.

## 2017-02-06 ENCOUNTER — Emergency Department (HOSPITAL_COMMUNITY)

## 2017-02-06 ENCOUNTER — Encounter (HOSPITAL_COMMUNITY): Payer: Self-pay | Admitting: Emergency Medicine

## 2017-02-06 ENCOUNTER — Emergency Department (HOSPITAL_COMMUNITY)
Admission: EM | Admit: 2017-02-06 | Discharge: 2017-02-06 | Disposition: A | Discharge status: Home / Self Care | Attending: Emergency Medicine | Admitting: Emergency Medicine

## 2017-02-06 DIAGNOSIS — M545 Low back pain, unspecified: Secondary | ICD-10-CM

## 2017-02-06 DIAGNOSIS — F17210 Nicotine dependence, cigarettes, uncomplicated: Secondary | ICD-10-CM | POA: Insufficient documentation

## 2017-02-06 DIAGNOSIS — R1084 Generalized abdominal pain: Secondary | ICD-10-CM

## 2017-02-06 DIAGNOSIS — R1031 Right lower quadrant pain: Secondary | ICD-10-CM | POA: Insufficient documentation

## 2017-02-06 DIAGNOSIS — R0602 Shortness of breath: Secondary | ICD-10-CM | POA: Insufficient documentation

## 2017-02-06 DIAGNOSIS — R300 Dysuria: Secondary | ICD-10-CM

## 2017-02-06 DIAGNOSIS — J449 Chronic obstructive pulmonary disease, unspecified: Secondary | ICD-10-CM | POA: Insufficient documentation

## 2017-02-06 LAB — CBC
HCT: 44 % (ref 39.0–52.0)
Hemoglobin: 16.1 g/dL (ref 13.0–17.0)
MCH: 37.4 pg — ABNORMAL HIGH (ref 26.0–34.0)
MCHC: 36.6 g/dL — AB (ref 30.0–36.0)
MCV: 102.3 fL — ABNORMAL HIGH (ref 78.0–100.0)
Platelets: 285 10*3/uL (ref 150–400)
RBC: 4.3 MIL/uL (ref 4.22–5.81)
RDW: 13.3 % (ref 11.5–15.5)
WBC: 6.8 10*3/uL (ref 4.0–10.5)

## 2017-02-06 LAB — I-STAT TROPONIN, ED: TROPONIN I, POC: 0 ng/mL (ref 0.00–0.08)

## 2017-02-06 LAB — COMPREHENSIVE METABOLIC PANEL
ALT: 67 U/L — ABNORMAL HIGH (ref 17–63)
ANION GAP: 8 (ref 5–15)
AST: 42 U/L — ABNORMAL HIGH (ref 15–41)
Albumin: 3.6 g/dL (ref 3.5–5.0)
Alkaline Phosphatase: 69 U/L (ref 38–126)
BUN: 11 mg/dL (ref 6–20)
CALCIUM: 9.3 mg/dL (ref 8.9–10.3)
CHLORIDE: 100 mmol/L — AB (ref 101–111)
CO2: 28 mmol/L (ref 22–32)
Creatinine, Ser: 0.86 mg/dL (ref 0.61–1.24)
Glucose, Bld: 113 mg/dL — ABNORMAL HIGH (ref 65–99)
Potassium: 3.7 mmol/L (ref 3.5–5.1)
Sodium: 136 mmol/L (ref 135–145)
Total Bilirubin: 0.6 mg/dL (ref 0.3–1.2)
Total Protein: 8.6 g/dL — ABNORMAL HIGH (ref 6.5–8.1)

## 2017-02-06 LAB — URINALYSIS, ROUTINE W REFLEX MICROSCOPIC
BILIRUBIN URINE: NEGATIVE
GLUCOSE, UA: 150 mg/dL — AB
HGB URINE DIPSTICK: NEGATIVE
Ketones, ur: NEGATIVE mg/dL
Leukocytes, UA: NEGATIVE
Nitrite: NEGATIVE
PH: 6 (ref 5.0–8.0)
Protein, ur: NEGATIVE mg/dL
Specific Gravity, Urine: 1.032 — ABNORMAL HIGH (ref 1.005–1.030)

## 2017-02-06 LAB — RAPID URINE DRUG SCREEN, HOSP PERFORMED
Amphetamines: NOT DETECTED
BARBITURATES: NOT DETECTED
Benzodiazepines: NOT DETECTED
Cocaine: POSITIVE — AB
Opiates: POSITIVE — AB
Tetrahydrocannabinol: NOT DETECTED

## 2017-02-06 LAB — LIPASE, BLOOD: LIPASE: 30 U/L (ref 11–51)

## 2017-02-06 LAB — BRAIN NATRIURETIC PEPTIDE: B Natriuretic Peptide: 118.8 pg/mL — ABNORMAL HIGH (ref 0.0–100.0)

## 2017-02-06 MED ORDER — HYDROMORPHONE HCL 1 MG/ML IJ SOLN
1.0000 mg | Freq: Once | INTRAMUSCULAR | Status: AC
  Administered 2017-02-06: 1 mg via INTRAVENOUS
  Filled 2017-02-06: qty 1

## 2017-02-06 MED ORDER — ONDANSETRON HCL 4 MG/2ML IJ SOLN
4.0000 mg | Freq: Once | INTRAMUSCULAR | Status: AC
  Administered 2017-02-06: 4 mg via INTRAVENOUS
  Filled 2017-02-06: qty 2

## 2017-02-06 MED ORDER — IOPAMIDOL (ISOVUE-300) INJECTION 61%
INTRAVENOUS | Status: AC
  Filled 2017-02-06: qty 100

## 2017-02-06 MED ORDER — MORPHINE SULFATE (PF) 4 MG/ML IV SOLN
8.0000 mg | Freq: Once | INTRAVENOUS | Status: AC
  Administered 2017-02-06: 8 mg via INTRAVENOUS
  Filled 2017-02-06: qty 2

## 2017-02-06 MED ORDER — CYCLOBENZAPRINE HCL 10 MG PO TABS: 10.0000 mg | ORAL_TABLET | Freq: Two times a day (BID) | ORAL | 0 refills | Status: DC | PRN

## 2017-02-06 MED ORDER — POLYETHYLENE GLYCOL 3350 17 G PO PACK: 17.0000 g | PACK | Freq: Every day | ORAL | 0 refills | Status: DC

## 2017-02-06 MED ORDER — IOPAMIDOL (ISOVUE-300) INJECTION 61%
100.0000 mL | Freq: Once | INTRAVENOUS | Status: AC | PRN
Start: 2017-02-06 — End: 2017-02-06
  Administered 2017-02-06: 100 mL via INTRAVENOUS

## 2017-02-06 MED ORDER — FENTANYL CITRATE (PF) 100 MCG/2ML IJ SOLN
50.0000 ug | INTRAMUSCULAR | Status: DC | PRN
  Administered 2017-02-06: 50 ug via NASAL
  Filled 2017-02-06: qty 2

## 2017-02-06 NOTE — ED Notes (Signed)
Bed: [facility] Expected date:  Expected time:  Means of arrival:  Comments: 

## 2017-02-06 NOTE — ED Notes (Signed)
Pt commenting on this writers looks.  Pt continues to ask nurse about personal life and says things like, "your husband must be a very happy man.  do you cook?"

## 2017-02-06 NOTE — ED Notes (Signed)
Pt commenting on nurse's good looks and how nurses are younger than his daughter.  Pt demanding food and even though pt stated that he has not urinated "one drop" in 3 days, pt was able to give a sample in the urinal to the tune of 500cc.

## 2017-02-06 NOTE — ED Notes (Signed)
Pt ambulatory and independent at discharge.  Verbalized understanding of discharge instructions 

## 2017-02-06 NOTE — Discharge Instructions (Signed)
All of your lab work and imaging has been reassuring. Unknown cause of your symptoms. It is important that she stop smoking. Limit your heavy lifting. Performed low back stretches. Alternate warm and cold compresses to her lower back.  It is important that she follow up with a primary care doctor for further workup.  Please the the flexeril for muscle relaxation. This medication will make you drowsy so avoid situation that could place you in danger.   Workup has been normal. Please take medications as prescribed and instructed.  SEEK IMMEDIATE MEDICAL ATTENTION IF: New numbness, tingling, weakness, or problem with the use of your arms or legs.  Severe back pain not relieved with medications.  Change in bowel or bladder control.  Urinary retention.  Numbness in your groin.  Increasing pain in any areas of the body (such as chest or abdominal pain).  Shortness of breath, dizziness or fainting.  Nausea (feeling sick to your stomach), vomiting, fever, or sweats.    One cap of Miralax mixed in a glass of water 1-2 times daily until bowel movements are regular. An over the counter stool softener such as Colace or Senokat S can also be used.  Follow up with your primary physician in regard's to today's visit. Return to ER for vomiting, new or worsening symptoms, any additional concerns.   GETTING TO GOOD BOWEL HEALTH.     The goal: ONE SOFT BOWEL MOVEMENT A DAY!  To have soft, regular bowel movements:  Drink at least 8 tall glasses of water a day.   Take plenty of fiber.  Fiber is the undigested part of plant food that passes into the colon, acting s ?natures broom? to encourage bowel motility and movement.  Fiber can absorb and hold large amounts of water. This results in a larger, bulkier stool, which is soft and easier to pass. Work gradually over several weeks up to 6 servings a day of fiber (25g a day even more if needed) in the form of: Vegetables -- Root (potatoes, carrots, turnips), leafy  green (lettuce, salad greens, celery, spinach), or cooked high residue (cabbage, broccoli, etc) Fruit -- Fresh (unpeeled skin & pulp), Dried (prunes, apricots, cherries, etc ),  or stewed ( applesauce)  Whole grain breads, pasta, etc (whole wheat)  Bran cereals  No reading or other relaxing activity while on the toilet. If bowel movements take longer than 5 minutes, you are too constipated

## 2017-02-06 NOTE — ED Provider Notes (Signed)
[facility] DEPT Provider Note   CSN: [id number] Arrival date & time: 02/06/17  1352     History   Chief Complaint Chief Complaint  Patient presents with  . Flank Pain  . Abdominal Pain  . Dysuria    HPI [name] is a 51 y.o. male.  HPI 51 year old Caucasian male past medical history significant for COPD, tobacco use, polysubstance abuse, IV drug use presents to the emergency Department today with complaints of low back pain that radiates to his right flank into his entire abdomen. The patient states the pain has been ongoing for several weeks. States he was seen on the ED on 9/10 for same and diagnosed with muscle sprain at that time. Patient has been taking significant amount of anti-inflammatories with little relief. Patient states the pain is improved and he presents to ED again today for evaluation. Patient has significant amount of pain in triage. Patient describes the pain as sharp. The pain is constant however intensity is intermittent. Reports associated decreased urine output, change in bowel habits. Last bowel movement was 2 days ago. Denies any melena or hematochezia. Denies any diarrhea. Patient denies any recent trauma, night sweats, headaches, fever. Patient states that he has had used IV drugs in the past but not for 6 months. Patient states the pain is significant. Denies any associated nausea, emesis. Movement and palpation makes the pain worse. Nothing makes the pain better.  Patient also states that "she was diagnosed with CHF in prison". Patient states that his shortness of breath is increased the past 2-3 weeks. States that he can barely do small amounts of activity without getting short of breath. Patient denies any associated chest pain, lower extremity edema, calf tenderness, history of DVT/PE, hemoptysis, recent hospitalization, prolonged immobilization. No pleuritic cp. Patient denies any other cardiac history. On review patient did have a  echocardiogram and cardiac catheterization in 2017 that was unremarkable. During his last hospitalization for shortness of breath and chest pain with elevated troponins patient left AMA. Patient states that he has not followed up with a cardiologist. Patient denies any known history of hypertension, diabetes, hyperlipidemia.   Pt denies any fever, chill, ha, vision changes, lightheadedness, dizziness, congestion, neck pain, cp, cough, melena, hematochezia, lower extremity paresthesias. Pt denies any ha, night sweats, hx of ivdu/cancer, loss or bowel or bladder, urinary retention, saddle paresthesias, lower extremity paresthesias.    Past Medical History:  Diagnosis Date  . COPD (chronic obstructive pulmonary disease) ([place])   . Panic attacks   . Tobacco abuse     Patient Active Problem List   Diagnosis Date Noted  . Elevated troponin I level   . SOB (shortness of breath) 10/12/2015  . Troponin level elevated 10/12/2015  . Chest pain 10/12/2015  . Cocaine abuse 10/12/2015  . Cocaine abuse 10/09/2015  . Homeless single person   . Syncope and collapse 10/08/2015  . SOB (shortness of breath) 10/08/2015  . Chest pain 10/08/2015  . COPD (chronic obstructive pulmonary disease) ([place]) 10/08/2015  . Acute bronchitis 10/08/2015    Past Surgical History:  Procedure Laterality Date  . CARDIAC CATHETERIZATION N/A 10/14/2015   Procedure: Left Heart Cath and Coronary Angiography;  Surgeon: Sherren Mocha, MD;  Location: [facility];  Service: Cardiovascular;  Laterality: N/A;  . FOOT SURGERY Right   . HAND SURGERY    . ORIF METACARPAL FRACTURE Left 2011  . ROTATOR CUFF REPAIR Right   . SHOULDER SURGERY  Home Medications    Prior to Admission medications   Medication Sig Start Date End Date Taking? Authorizing Provider  ibuprofen (ADVIL,MOTRIN) 200 MG tablet Take 200 mg by mouth every 6 (six) hours as needed for moderate pain.   Yes [provider]  ibuprofen  (ADVIL,MOTRIN) 600 MG tablet Take 1 tablet (600 mg total) by mouth 3 (three) times daily. 01/28/17  Yes Caccavale, Sophia, PA-C  albuterol (PROVENTIL HFA;VENTOLIN HFA) 108 (90 Base) MCG/ACT inhaler Inhale 2 puffs into the lungs every 6 (six) hours as needed for wheezing or shortness of breath. 10/11/15   Gherghe, Vella Redhead, MD  azithromycin (ZITHROMAX Z-PAK) 250 MG tablet 2 po day one, then 1 daily x 4 days Patient not taking: Reported on 10/12/2015 07/22/15   Elnora Morrison, MD  guaiFENesin-dextromethorphan (ROBITUSSIN DM) 100-10 MG/5ML syrup Take 5 mLs by mouth every 4 (four) hours as needed for cough. Patient not taking: Reported on 02/06/2017 10/11/15   Caren Griffins, MD  levofloxacin (LEVAQUIN) 750 MG tablet Take 1 tablet (750 mg total) by mouth daily. Patient not taking: Reported on 02/06/2017 10/11/15   Caren Griffins, MD  methocarbamol (ROBAXIN) 500 MG tablet Take 1 tablet (500 mg total) by mouth 2 (two) times daily as needed for muscle spasms. Patient not taking: Reported on 02/06/2017 01/28/17   Caccavale, Sophia, PA-C  nicotine (EQ NICOTINE) 21 mg/24hr patch Place 1 patch (21 mg total) onto the skin daily. Patient not taking: Reported on 02/06/2017 10/11/15   Caren Griffins, MD  predniSONE (DELTASONE) 20 MG tablet Take 2 tablets (40 mg total) by mouth daily with breakfast. 40 mg x 3 days then 20 mg x 3 days Patient not taking: Reported on 02/06/2017 10/11/15   Caren Griffins, MD    Family History Family History  Problem Relation Age of Onset  . Hypertension Mother   . Cancer Other   . Stroke Other   . Coronary artery disease Other     Social History Social History  Substance Use Topics  . Smoking status: Current Every Day Smoker    Packs/day: 0.50    Types: Cigarettes  . Smokeless tobacco: Never Used  . Alcohol use Yes     Comment: Daily. Heavy.      Allergies   Penicillins and Penicillins   Review of Systems Review of Systems  Constitutional: Negative for chills  and fever.  HENT: Negative for congestion and sore throat.   Eyes: Negative for visual disturbance.  Respiratory: Positive for shortness of breath. Negative for cough and wheezing.   Cardiovascular: Negative for chest pain, palpitations and leg swelling.  Gastrointestinal: Positive for abdominal distention, abdominal pain and constipation. Negative for blood in stool, diarrhea, nausea and vomiting.  Genitourinary: Positive for decreased urine volume and flank pain. Negative for dysuria, frequency, hematuria, scrotal swelling, testicular pain and urgency.  Musculoskeletal: Negative for arthralgias and myalgias.  Skin: Negative for rash.  Neurological: Negative for dizziness, syncope, weakness, light-headedness, numbness and headaches.  Psychiatric/Behavioral: Negative for sleep disturbance. The patient is not nervous/anxious.      Physical Exam Updated Vital Signs BP (!) 166/113 (BP Location: Left Arm)   Pulse 72   Temp 98.7 F (37.1 C) (Oral)   Resp 16   SpO2 100%   Physical Exam  Constitutional: He is oriented to person, place, and time. He appears well-developed and well-nourished.  Non-toxic appearance. He appears distressed (due to pain).  HENT:  Head: Normocephalic and atraumatic.  Nose: Nose normal.  Mouth/Throat: Oropharynx is clear and moist.  Eyes: Pupils are equal, round, and reactive to light. Conjunctivae are normal. Right eye exhibits no discharge. Left eye exhibits no discharge.  Neck: Normal range of motion. Neck supple. No JVD present. No tracheal deviation present.  Cardiovascular: Normal rate, regular rhythm, normal heart sounds and intact distal pulses.  Exam reveals no gallop and no friction rub.   No murmur heard. Pulmonary/Chest: Effort normal. No accessory muscle usage. No respiratory distress. He has decreased breath sounds. He has no wheezes. He has no rhonchi. He has no rales. He exhibits no tenderness.  No hypoxia or tachypnea.  Abdominal: Soft. Bowel  sounds are normal. He exhibits distension. There is generalized tenderness. There is CVA tenderness. There is no rigidity, no rebound, no guarding, no tenderness at McBurney's point and negative Murphy's sign. A hernia is present. Hernia confirmed positive in the ventral area.  Right sided cva tenderness  Musculoskeletal: Normal range of motion.  No lower extremity edema or calf tenderness.  No midline T spine or L spine tenderness. No deformities or step offs noted. Full ROM. Pelvis is stable.   Lymphadenopathy:    He has no cervical adenopathy.  Neurological: He is alert and oriented to person, place, and time.  Skin: Skin is warm and dry. Capillary refill takes less than 2 seconds. He is not diaphoretic.  Psychiatric: His behavior is normal. Judgment and thought content normal.  Nursing note and vitals reviewed.    ED Treatments / Results  Labs (all labs ordered are listed, but only abnormal results are displayed) Labs Reviewed  URINALYSIS, ROUTINE W REFLEX MICROSCOPIC - Abnormal; Notable for the following:       Result Value   Specific Gravity, Urine 1.032 (*)    Glucose, UA 150 (*)    All other components within normal limits  CBC - Abnormal; Notable for the following:    MCV 102.3 (*)    MCH 37.4 (*)    MCHC 36.6 (*)    All other components within normal limits  COMPREHENSIVE METABOLIC PANEL - Abnormal; Notable for the following:    Chloride 100 (*)    Glucose, Bld 113 (*)    Total Protein 8.6 (*)    AST 42 (*)    ALT 67 (*)    All other components within normal limits  BRAIN NATRIURETIC PEPTIDE - Abnormal; Notable for the following:    B Natriuretic Peptide 118.8 (*)    All other components within normal limits  RAPID URINE DRUG SCREEN, HOSP PERFORMED - Abnormal; Notable for the following:    Opiates POSITIVE (*)    Cocaine POSITIVE (*)    All other components within normal limits  URINE CULTURE  LIPASE, BLOOD  I-STAT TROPONIN, ED  I-STAT TROPONIN, ED    EKG   EKG Interpretation None       Radiology Ct Abdomen Pelvis W Contrast  Result Date: 02/06/2017 CLINICAL DATA:  Right-sided flank pain with radiation to the abdomen. COPD. EXAM: CT ABDOMEN AND PELVIS WITH CONTRAST TECHNIQUE: Multidetector CT imaging of the abdomen and pelvis was performed using the standard protocol following bolus administration of intravenous contrast. CONTRAST:  14mL ISOVUE-300 IOPAMIDOL (ISOVUE-300) INJECTION 61% COMPARISON:  10/16/2010 FINDINGS: Lower chest: Clear lung bases. Borderline cardiomegaly, without pericardial or pleural effusion. Hepatobiliary: Mild to moderate hepatic steatosis. No focal liver lesion. Normal gallbladder, without biliary ductal dilatation. Pancreas: Normal, without mass or ductal dilatation. Spleen: Normal in size, without focal abnormality. Adrenals/Urinary Tract: Normal adrenal  glands. An upper pole left renal cyst of 3.4 cm. Interpolar right renal too small to characterize lesion. No hydronephrosis. Normal urinary bladder. Stomach/Bowel: Normal stomach, without wall thickening. Colonic stool burden suggests constipation. Normal terminal ileum and appendix. Normal small bowel. Vascular/Lymphatic: Aortic and branch vessel atherosclerosis. No abdominopelvic adenopathy. Reproductive: Normal prostate. Other: No significant free fluid. Fat containing periumbilical hernia is small on image 59/series 2. No bowel involvement or other acute complication. Musculoskeletal: Bilateral L5 pars defects. Grade 1 L5-S1 anterolisthesis with advanced degenerative disc disease at this level. IMPRESSION: 1.  No acute process in the abdomen or pelvis. 2. Hepatic steatosis. 3. Fat containing ventral abdominal wall hernia. 4.  Aortic Atherosclerosis (ICD10-I70.0). 5. L5 pars defects with malalignment, increased since 10/16/2010. Electronically Signed   By: Abigail Miyamoto M.D.   On: 02/06/2017 17:32   Dg Abdomen Acute W/chest  Result Date: 02/06/2017 CLINICAL DATA:  Right-sided  flank pain.  Unable to void EXAM: DG ABDOMEN ACUTE W/ 1V CHEST COMPARISON:  Chest radiograph Oct 12, 2015 ; CT abdomen and pelvis Oct 16, 2010 FINDINGS: PA chest: There is no edema or consolidation. Heart is upper normal in size with pulmonary vascularity within normal limits. No adenopathy. There is old trauma involving the lateral right clavicle. Supine and upright abdomen: There is diffuse stool throughout the colon. There is no bowel dilatation or air-fluid level to suggest bowel obstruction. No free air. Contrast is seen in each renal collecting system, ureter and bladder without focal lesion in these structures apparent on this study. IMPRESSION: Diffuse stool throughout colon. No bowel obstruction or free air. No lung edema or consolidation. Electronically Signed   By: Lowella Grip III M.D.   On: 02/06/2017 17:36    Procedures Procedures (including critical care time)  Medications Ordered in ED Medications  fentaNYL (SUBLIMAZE) injection 50 mcg (50 mcg Nasal Given 02/06/17 1441)  morphine 4 MG/ML injection 8 mg (8 mg Intravenous Given 02/06/17 1550)  ondansetron (ZOFRAN) injection 4 mg (4 mg Intravenous Given 02/06/17 1551)  iopamidol (ISOVUE-300) 61 % injection 100 mL (100 mLs Intravenous Contrast Given 02/06/17 1649)  HYDROmorphone (DILAUDID) injection 1 mg (1 mg Intravenous Given 02/06/17 1752)     Initial Impression / Assessment and Plan / ED Course  I have reviewed the triage vital signs and the nursing notes.  Pertinent labs & imaging results that were available during my care of the patient were reviewed by me and considered in my medical decision making (see chart for details).  Clinical Course as of Feb 07 2028  Wed Feb 06, 2017  1818 Pt reassessed through window and was laying supine in the bed and appeared to be sleeping. When open the door he starts yelling his pain is severe. He states "I have been asking for something to drink for the past hour." He is also requesting a  sandwich.   [KL]  1945 Informed patient of his findings which were reassuring. At that time patient was sleeping. I woke patient up. He requested more pain medicine and I informed him that he was just sleeping. He then requested a coke and a second sandwich.  [KL]    Clinical Course User Index [KL] Doristine Devoid, PA-C    Patient presents to the ED with complaints of generalized abdominal pain, ongoing back pain, decreased urine. Patient appears to be in a significant amount of pain on initial arrival. Patient was recently seen on 9/10 for back pain and diagnosed with muscle sprain that time.  Patient states that symptoms are not improving. Patient does have a history of IV drug use however states that his been 6 months since he used IV drugs. Patient also notes some shortness of breath has been increasingly worsening over the past 2 weeks. States has history of "CHF" however I cannot find this patient's chart. Patient did have a normal And echo last year when he was admitted for chest pain or shortness of breath. Patient denies any chest pain. Patient is felt to follow up with outpatient PCP and cardiology. Patient does have a history of cocaine abuse.  On exam patient is overall well-appearing and nontoxic. Vital signs are reassuring. No fever or elevated heart rate.  Exam is overall reassuring. No focal neuro deficits. Neurovascularly intact in all extremities. Abdomen is mildly distended and generalized tenderness without any signs of peritonitis. No lower extremity edema or calf tenderness. Patient is tender to palpation of the lumbar region of the paraspinal musculature likely musculoskeletal.  Laboratoryreassuring. No leukocytosis. Mild elevation in AST and ALT consistent with patient's chronic alcohol abuse. Kidney function is normal. Mild elevation in BNP of 118 however this is down from patient's last BNP of 190. Patient shows no signs of fluid overload. Chest x-ray is unremarkable. UA  shows no signs of infection. Urine drug screen positive for opiates and cocaine. EKG shows no ischemic changes. Troponin is negative.  CT scan obtained that showed no acute findings. Does note chronic findings with your were discussed with patient. Acute abdominal x-ray a chest x-ray does show some moderate stool burden. Patient's symptoms may be related to constipation. No signs of obstruction. We will prescribe patient MiraLAX.  Repeat abdominal exam is benign and shows no signs of peritonitis. Patient was rechecked several times in the ED and was sleeping on my exam. However when he was awakened he complained of pain and asked for more pain medicine. Patient also asked for several sandwiches and drinks in ED.   The patient's clinical presentation does not seem consistent with PE, ACS, dissection, pneumonia, chf exacerbation. No red flag symptoms that are concerning for cauda equina. Unknown etiology of patient's back pain may be chronic in nature. Pt with hx of ivdu however over 6 months ago per pt. 9 patient without signs and symptoms of night sweats, fever therapy concerning for abscess. Encouraged muscle relaxers and anti-inflammatories and symptomatically rest and treatment at home. Encourage MiraLAX for his constipation and abdominal pain. The patient shortness of breath likely combination of COPD, obesity. Slight elevation in BNP however reduced from prior lab. Patient does not appear to be fluid overloaded. Likely needs cardiology referral and outpatient setting for repeat echo if symptoms are not improving. Again patient did have a clean cardiac cath and echo 1 year ago.  Pt is hemodynamically stable, in NAD, & able to ambulate in the ED. Evaluation does not show pathology that would require ongoing emergent intervention or inpatient treatment. I explained the diagnosis to the patient. Pain has been managed & has no complaints prior to dc. Pt is comfortable with above plan and is stable for  discharge at this time. All questions were answered prior to disposition. Strict return precautions for f/u to the ED were discussed. Encouraged follow up with PCP. Did consult case manager to help find pcp follow up.   Pt counseled on smoking cessation for approximately 5-10 minutes.  Pt seen in conjunction with Dr. Lacinda Axon who is agreeable to the above plan.    Final Clinical Impressions(s) /  ED Diagnoses   Final diagnoses:  Acute bilateral low back pain without sciatica  Generalized abdominal pain  Dysuria  SOB (shortness of breath)    New Prescriptions Discharge Medication List as of 02/06/2017  7:44 PM    START taking these medications   Details  cyclobenzaprine (FLEXERIL) 10 MG tablet Take 1 tablet (10 mg total) by mouth 2 (two) times daily as needed for muscle spasms., Starting Wed 02/06/2017, Print    polyethylene glycol (MIRALAX / GLYCOLAX) packet Take 17 g by mouth daily., Starting Wed 02/06/2017, Print         Doristine Devoid, PA-C 02/06/17 2049    Nat Christen, MD 02/09/17 [phone]

## 2017-02-06 NOTE — ED Notes (Signed)
Pt sleeping. 

## 2017-02-06 NOTE — ED Triage Notes (Signed)
Pt dizzy upon standing and nearly fainted.  Pt states that he has had rt sided flank pain radiating to abdomen. Pt states that he has not had even a dribble of urine in 3 days.  Last BM was 2 days ago.

## 2017-02-07 ENCOUNTER — Telehealth: Payer: Self-pay | Admitting: Emergency Medicine

## 2017-02-07 NOTE — Telephone Encounter (Signed)
CM consulted for PCP follow up for pt after D/C from the ED.  CM attempted to call the number in the chart but it goes to the [facility].  CM removed the number from the chart.  Pt is homeless and needs to be referred to Marliss Coots, NP at the [facility] for follow up with a PCP in the future.  CM is prepared for pt's return to discuss this information.

## 2017-02-08 LAB — URINE CULTURE: Culture: NO GROWTH

## 2017-02-15 ENCOUNTER — Encounter (HOSPITAL_COMMUNITY): Payer: Self-pay | Admitting: Emergency Medicine

## 2017-02-15 DIAGNOSIS — M5137 Other intervertebral disc degeneration, lumbosacral region: Secondary | ICD-10-CM | POA: Diagnosis present

## 2017-02-15 DIAGNOSIS — Z791 Long term (current) use of non-steroidal anti-inflammatories (NSAID): Secondary | ICD-10-CM

## 2017-02-15 DIAGNOSIS — Z880 Allergy status to penicillin: Secondary | ICD-10-CM

## 2017-02-15 DIAGNOSIS — G8929 Other chronic pain: Secondary | ICD-10-CM | POA: Diagnosis present

## 2017-02-15 DIAGNOSIS — Z6833 Body mass index (BMI) 33.0-33.9, adult: Secondary | ICD-10-CM

## 2017-02-15 DIAGNOSIS — M4644 Discitis, unspecified, thoracic region: Secondary | ICD-10-CM | POA: Diagnosis present

## 2017-02-15 DIAGNOSIS — B9689 Other specified bacterial agents as the cause of diseases classified elsewhere: Secondary | ICD-10-CM | POA: Diagnosis present

## 2017-02-15 DIAGNOSIS — D7589 Other specified diseases of blood and blood-forming organs: Secondary | ICD-10-CM | POA: Diagnosis present

## 2017-02-15 DIAGNOSIS — F1410 Cocaine abuse, uncomplicated: Secondary | ICD-10-CM | POA: Diagnosis present

## 2017-02-15 DIAGNOSIS — F1910 Other psychoactive substance abuse, uncomplicated: Secondary | ICD-10-CM | POA: Diagnosis present

## 2017-02-15 DIAGNOSIS — J449 Chronic obstructive pulmonary disease, unspecified: Secondary | ICD-10-CM | POA: Diagnosis present

## 2017-02-15 DIAGNOSIS — F17210 Nicotine dependence, cigarettes, uncomplicated: Secondary | ICD-10-CM | POA: Diagnosis present

## 2017-02-15 DIAGNOSIS — B1920 Unspecified viral hepatitis C without hepatic coma: Secondary | ICD-10-CM | POA: Diagnosis present

## 2017-02-15 DIAGNOSIS — Z590 Homelessness: Secondary | ICD-10-CM

## 2017-02-15 DIAGNOSIS — I272 Pulmonary hypertension, unspecified: Secondary | ICD-10-CM | POA: Diagnosis present

## 2017-02-15 DIAGNOSIS — E669 Obesity, unspecified: Secondary | ICD-10-CM | POA: Diagnosis present

## 2017-02-15 DIAGNOSIS — B3789 Other sites of candidiasis: Secondary | ICD-10-CM | POA: Diagnosis present

## 2017-02-15 DIAGNOSIS — I058 Other rheumatic mitral valve diseases: Secondary | ICD-10-CM | POA: Diagnosis present

## 2017-02-15 DIAGNOSIS — G061 Intraspinal abscess and granuloma: Secondary | ICD-10-CM | POA: Diagnosis present

## 2017-02-15 DIAGNOSIS — I509 Heart failure, unspecified: Secondary | ICD-10-CM | POA: Diagnosis present

## 2017-02-15 DIAGNOSIS — I110 Hypertensive heart disease with heart failure: Secondary | ICD-10-CM | POA: Diagnosis present

## 2017-02-15 DIAGNOSIS — Z79899 Other long term (current) drug therapy: Secondary | ICD-10-CM

## 2017-02-15 DIAGNOSIS — M4624 Osteomyelitis of vertebra, thoracic region: Principal | ICD-10-CM | POA: Diagnosis present

## 2017-02-15 DIAGNOSIS — M4316 Spondylolisthesis, lumbar region: Secondary | ICD-10-CM | POA: Diagnosis present

## 2017-02-15 MED ORDER — OXYCODONE-ACETAMINOPHEN 5-325 MG PO TABS
ORAL_TABLET | ORAL | Status: AC
  Filled 2017-02-15: qty 1

## 2017-02-15 MED ORDER — OXYCODONE-ACETAMINOPHEN 5-325 MG PO TABS
1.0000 | ORAL_TABLET | Freq: Once | ORAL | Status: AC
  Administered 2017-02-15: 1 via ORAL

## 2017-02-15 NOTE — ED Triage Notes (Signed)
Patient reports worsening SOB with productive cough and chest congestion onset this week , pt. added persistent mid back pain for several weeks .

## 2017-02-16 ENCOUNTER — Other Ambulatory Visit: Payer: Self-pay

## 2017-02-16 ENCOUNTER — Emergency Department (HOSPITAL_COMMUNITY): Payer: Self-pay

## 2017-02-16 ENCOUNTER — Encounter (HOSPITAL_COMMUNITY): Payer: Self-pay | Admitting: *Deleted

## 2017-02-16 ENCOUNTER — Inpatient Hospital Stay (HOSPITAL_COMMUNITY)
Admission: EM | Admit: 2017-02-16 | Discharge: 2017-02-21 | DRG: 477 | Disposition: A | Discharge status: Home / Self Care | Payer: Self-pay | Attending: Family Medicine | Admitting: Family Medicine

## 2017-02-16 DIAGNOSIS — R45851 Suicidal ideations: Secondary | ICD-10-CM

## 2017-02-16 DIAGNOSIS — M869 Osteomyelitis, unspecified: Secondary | ICD-10-CM

## 2017-02-16 DIAGNOSIS — G8929 Other chronic pain: Secondary | ICD-10-CM

## 2017-02-16 DIAGNOSIS — M8618 Other acute osteomyelitis, other site: Secondary | ICD-10-CM

## 2017-02-16 DIAGNOSIS — F1990 Other psychoactive substance use, unspecified, uncomplicated: Secondary | ICD-10-CM

## 2017-02-16 DIAGNOSIS — M4620 Osteomyelitis of vertebra, site unspecified: Secondary | ICD-10-CM | POA: Diagnosis present

## 2017-02-16 DIAGNOSIS — M545 Low back pain, unspecified: Secondary | ICD-10-CM

## 2017-02-16 DIAGNOSIS — R7881 Bacteremia: Secondary | ICD-10-CM

## 2017-02-16 DIAGNOSIS — I330 Acute and subacute infective endocarditis: Secondary | ICD-10-CM

## 2017-02-16 DIAGNOSIS — M4644 Discitis, unspecified, thoracic region: Secondary | ICD-10-CM

## 2017-02-16 DIAGNOSIS — M4640 Discitis, unspecified, site unspecified: Secondary | ICD-10-CM

## 2017-02-16 HISTORY — DX: Heart failure, unspecified: I50.9

## 2017-02-16 LAB — COMPREHENSIVE METABOLIC PANEL
ALBUMIN: 2.9 g/dL — AB (ref 3.5–5.0)
ALBUMIN: 3.2 g/dL — AB (ref 3.5–5.0)
ALK PHOS: 50 U/L (ref 38–126)
ALK PHOS: 53 U/L (ref 38–126)
ALT: 47 U/L (ref 17–63)
ALT: 53 U/L (ref 17–63)
ANION GAP: 4 — AB (ref 5–15)
AST: 33 U/L (ref 15–41)
AST: 34 U/L (ref 15–41)
Anion gap: 8 (ref 5–15)
BILIRUBIN TOTAL: 0.7 mg/dL (ref 0.3–1.2)
BILIRUBIN TOTAL: 0.8 mg/dL (ref 0.3–1.2)
BUN: 8 mg/dL (ref 6–20)
CALCIUM: 8.7 mg/dL — AB (ref 8.9–10.3)
CALCIUM: 8.8 mg/dL — AB (ref 8.9–10.3)
CO2: 29 mmol/L (ref 22–32)
CO2: 29 mmol/L (ref 22–32)
CREATININE: 0.84 mg/dL (ref 0.61–1.24)
CREATININE: 0.86 mg/dL (ref 0.61–1.24)
Chloride: 104 mmol/L (ref 101–111)
Chloride: 96 mmol/L — ABNORMAL LOW (ref 101–111)
GFR calc Af Amer: >60 mL/min (ref >60)
GFR calc non Af Amer: >60 mL/min (ref >60)
GFR calc non Af Amer: >60 mL/min (ref >60)
GLUCOSE: 112 mg/dL — AB (ref 65–99)
GLUCOSE: 135 mg/dL — AB (ref 65–99)
Potassium: 3.1 mmol/L — ABNORMAL LOW (ref 3.5–5.1)
Potassium: 3.7 mmol/L (ref 3.5–5.1)
Sodium: 133 mmol/L — ABNORMAL LOW (ref 135–145)
Sodium: 137 mmol/L (ref 135–145)
TOTAL PROTEIN: 7.4 g/dL (ref 6.5–8.1)
TOTAL PROTEIN: 8.2 g/dL — AB (ref 6.5–8.1)

## 2017-02-16 LAB — CBC WITH DIFFERENTIAL/PLATELET
BASOS PCT: 0 %
Basophils Absolute: 0 10*3/uL (ref 0.0–0.1)
Eosinophils Absolute: 0.1 10*3/uL (ref 0.0–0.7)
Eosinophils Relative: 1 %
HEMATOCRIT: 41.3 % (ref 39.0–52.0)
HEMOGLOBIN: 14.5 g/dL (ref 13.0–17.0)
Lymphocytes Relative: 24 %
Lymphs Abs: 1.9 10*3/uL (ref 0.7–4.0)
MCH: 36.3 pg — ABNORMAL HIGH (ref 26.0–34.0)
MCHC: 35.1 g/dL (ref 30.0–36.0)
MCV: 103.3 fL — ABNORMAL HIGH (ref 78.0–100.0)
MONOS PCT: 7 %
Monocytes Absolute: 0.5 10*3/uL (ref 0.1–1.0)
NEUTROS ABS: 5.5 10*3/uL (ref 1.7–7.7)
NEUTROS PCT: 69 %
Platelets: 282 10*3/uL (ref 150–400)
RBC: 4 MIL/uL — AB (ref 4.22–5.81)
RDW: 12.8 % (ref 11.5–15.5)
WBC: 7.9 10*3/uL (ref 4.0–10.5)

## 2017-02-16 LAB — RAPID URINE DRUG SCREEN, HOSP PERFORMED
Amphetamines: NOT DETECTED
Barbiturates: NOT DETECTED
Benzodiazepines: NOT DETECTED
COCAINE: POSITIVE — AB
OPIATES: POSITIVE — AB
Tetrahydrocannabinol: NOT DETECTED

## 2017-02-16 LAB — CBC
HEMATOCRIT: 40.7 % (ref 39.0–52.0)
HEMOGLOBIN: 13.6 g/dL (ref 13.0–17.0)
MCH: 34.9 pg — AB (ref 26.0–34.0)
MCHC: 33.4 g/dL (ref 30.0–36.0)
MCV: 104.4 fL — ABNORMAL HIGH (ref 78.0–100.0)
Platelets: 273 10*3/uL (ref 150–400)
RBC: 3.9 MIL/uL — AB (ref 4.22–5.81)
RDW: 13.3 % (ref 11.5–15.5)
WBC: 5.9 10*3/uL (ref 4.0–10.5)

## 2017-02-16 LAB — I-STAT TROPONIN, ED: Troponin i, poc: 0.02 ng/mL (ref 0.00–0.08)

## 2017-02-16 LAB — LACTIC ACID, PLASMA
LACTIC ACID, VENOUS: 1.4 mmol/L (ref 0.5–1.9)
LACTIC ACID, VENOUS: 1.3 mmol/L (ref 0.5–1.9)

## 2017-02-16 LAB — SEDIMENTATION RATE: Sed Rate: 63 mm/hr — ABNORMAL HIGH (ref 0–16)

## 2017-02-16 LAB — C-REACTIVE PROTEIN: CRP: 1.4 mg/dL — ABNORMAL HIGH (ref <1.0)

## 2017-02-16 LAB — BRAIN NATRIURETIC PEPTIDE: B Natriuretic Peptide: 74.5 pg/mL (ref 0.0–100.0)

## 2017-02-16 MED ORDER — LORAZEPAM 1 MG PO TABS
1.0000 mg | ORAL_TABLET | Freq: Four times a day (QID) | ORAL | Status: AC | PRN
  Administered 2017-02-16: 1 mg via ORAL
  Filled 2017-02-16: qty 1

## 2017-02-16 MED ORDER — LORAZEPAM 2 MG/ML IJ SOLN
0.0000 mg | Freq: Four times a day (QID) | INTRAMUSCULAR | Status: AC
  Administered 2017-02-16 – 2017-02-17 (×2): 2 mg via INTRAVENOUS
  Administered 2017-02-17: 1 mg via INTRAVENOUS
  Administered 2017-02-18 (×2): 2 mg via INTRAVENOUS
  Filled 2017-02-16 (×5): qty 1

## 2017-02-16 MED ORDER — ALBUTEROL SULFATE (2.5 MG/3ML) 0.083% IN NEBU
5.0000 mg | INHALATION_SOLUTION | Freq: Once | RESPIRATORY_TRACT | Status: DC
  Filled 2017-02-16: qty 6

## 2017-02-16 MED ORDER — POTASSIUM CHLORIDE CRYS ER 20 MEQ PO TBCR
40.0000 meq | EXTENDED_RELEASE_TABLET | Freq: Once | ORAL | Status: AC
  Administered 2017-02-16: 40 meq via ORAL
  Filled 2017-02-16: qty 2

## 2017-02-16 MED ORDER — ADULT MULTIVITAMIN W/MINERALS CH
1.0000 | ORAL_TABLET | Freq: Every day | ORAL | Status: DC
  Administered 2017-02-16 – 2017-02-21 (×6): 1 via ORAL
  Filled 2017-02-16 (×6): qty 1

## 2017-02-16 MED ORDER — PIPERACILLIN-TAZOBACTAM 3.375 G IVPB 30 MIN
3.3750 g | Freq: Once | INTRAVENOUS | Status: DC
  Filled 2017-02-16: qty 50

## 2017-02-16 MED ORDER — SODIUM CHLORIDE 0.9 % IV BOLUS (SEPSIS)
1000.0000 mL | Freq: Once | INTRAVENOUS | Status: AC
  Administered 2017-02-16: 1000 mL via INTRAVENOUS

## 2017-02-16 MED ORDER — VANCOMYCIN HCL IN DEXTROSE 1-5 GM/200ML-% IV SOLN
1000.0000 mg | Freq: Three times a day (TID) | INTRAVENOUS | Status: DC
  Administered 2017-02-17 (×2): 1000 mg via INTRAVENOUS
  Filled 2017-02-16 (×3): qty 200

## 2017-02-16 MED ORDER — KETOROLAC TROMETHAMINE 30 MG/ML IJ SOLN
30.0000 mg | Freq: Once | INTRAMUSCULAR | Status: AC
  Filled 2017-02-16 (×2): qty 1

## 2017-02-16 MED ORDER — DEXTROSE 5 % IV SOLN
2.0000 g | Freq: Three times a day (TID) | INTRAVENOUS | Status: DC
  Administered 2017-02-16 – 2017-02-17 (×3): 2 g via INTRAVENOUS
  Filled 2017-02-16 (×4): qty 2

## 2017-02-16 MED ORDER — LORAZEPAM 2 MG/ML IJ SOLN
1.0000 mg | Freq: Four times a day (QID) | INTRAMUSCULAR | Status: AC | PRN
  Administered 2017-02-19: 1 mg via INTRAVENOUS
  Filled 2017-02-16 (×2): qty 1

## 2017-02-16 MED ORDER — GADOBENATE DIMEGLUMINE 529 MG/ML IV SOLN
20.0000 mL | Freq: Once | INTRAVENOUS | Status: AC
  Administered 2017-02-16: 20 mL via INTRAVENOUS

## 2017-02-16 MED ORDER — VITAMIN B-1 100 MG PO TABS
100.0000 mg | ORAL_TABLET | Freq: Every day | ORAL | Status: DC
  Administered 2017-02-16 – 2017-02-21 (×6): 100 mg via ORAL
  Filled 2017-02-16 (×6): qty 1

## 2017-02-16 MED ORDER — SODIUM CHLORIDE 0.9% FLUSH
3.0000 mL | Freq: Two times a day (BID) | INTRAVENOUS | Status: DC
  Administered 2017-02-16 – 2017-02-21 (×10): 3 mL via INTRAVENOUS

## 2017-02-16 MED ORDER — ENOXAPARIN SODIUM 40 MG/0.4ML ~~LOC~~ SOLN
40.0000 mg | SUBCUTANEOUS | Status: DC
  Administered 2017-02-16: 40 mg via SUBCUTANEOUS
  Filled 2017-02-16: qty 0.4

## 2017-02-16 MED ORDER — KETOROLAC TROMETHAMINE 60 MG/2ML IM SOLN
30.0000 mg | Freq: Once | INTRAMUSCULAR | Status: AC
  Administered 2017-02-16: 30 mg via INTRAMUSCULAR
  Filled 2017-02-16: qty 2

## 2017-02-16 MED ORDER — ALBUTEROL SULFATE (2.5 MG/3ML) 0.083% IN NEBU
5.0000 mg | INHALATION_SOLUTION | Freq: Once | RESPIRATORY_TRACT | Status: AC
  Administered 2017-02-16: 5 mg via RESPIRATORY_TRACT
  Filled 2017-02-16: qty 6

## 2017-02-16 MED ORDER — HYDROMORPHONE HCL 1 MG/ML IJ SOLN
1.0000 mg | Freq: Once | INTRAMUSCULAR | Status: AC
  Administered 2017-02-16: 1 mg via INTRAVENOUS
  Filled 2017-02-16: qty 1

## 2017-02-16 MED ORDER — ALBUTEROL SULFATE (2.5 MG/3ML) 0.083% IN NEBU: 2.5000 mg | INHALATION_SOLUTION | Freq: Four times a day (QID) | RESPIRATORY_TRACT | Status: DC | PRN

## 2017-02-16 MED ORDER — FOLIC ACID 1 MG PO TABS
1.0000 mg | ORAL_TABLET | Freq: Every day | ORAL | Status: DC
  Administered 2017-02-16 – 2017-02-21 (×6): 1 mg via ORAL
  Filled 2017-02-16 (×7): qty 1

## 2017-02-16 MED ORDER — HYDROMORPHONE HCL 1 MG/ML IJ SOLN
1.0000 mg | INTRAMUSCULAR | Status: DC | PRN
  Administered 2017-02-16 – 2017-02-19 (×16): 1 mg via INTRAVENOUS
  Filled 2017-02-16 (×16): qty 1

## 2017-02-16 MED ORDER — THIAMINE HCL 100 MG/ML IJ SOLN: 100.0000 mg | Freq: Every day | INTRAMUSCULAR | Status: DC

## 2017-02-16 MED ORDER — IPRATROPIUM BROMIDE 0.02 % IN SOLN
0.5000 mg | Freq: Once | RESPIRATORY_TRACT | Status: AC
  Administered 2017-02-16: 0.5 mg via RESPIRATORY_TRACT
  Filled 2017-02-16: qty 2.5

## 2017-02-16 MED ORDER — NICOTINE 21 MG/24HR TD PT24
21.0000 mg | MEDICATED_PATCH | Freq: Every day | TRANSDERMAL | Status: DC
  Administered 2017-02-17 – 2017-02-21 (×5): 21 mg via TRANSDERMAL
  Filled 2017-02-16 (×5): qty 1

## 2017-02-16 MED ORDER — LORAZEPAM 2 MG/ML IJ SOLN
0.0000 mg | Freq: Two times a day (BID) | INTRAMUSCULAR | Status: AC
  Administered 2017-02-18 – 2017-02-19 (×2): 2 mg via INTRAVENOUS
  Filled 2017-02-16 (×3): qty 1

## 2017-02-16 MED ORDER — VANCOMYCIN HCL 10 G IV SOLR
2000.0000 mg | Freq: Once | INTRAVENOUS | Status: AC
  Administered 2017-02-16: 2000 mg via INTRAVENOUS
  Filled 2017-02-16: qty 2000

## 2017-02-16 NOTE — ED Provider Notes (Signed)
[facility] DEPT Provider Note   CSN: [id number] Arrival date & time: 02/15/17  2335     History   Chief Complaint Chief Complaint  Patient presents with  . Shortness of Breath    CHF  . Back Pain    HPI [name] is a 51 y.o. male past medical history significant for COPD, current every day smoker one quarter pack a day presenting with worsening productive cough and SOB. She also has associated subjective fever and chills. Patient also endorses back pain that has been ongoing for 3 weeks and along his lower rib cage. Patient has been seen twice prior for the same back pain in the last couple weeks. He reports that the ibuprofen has not been helping. She denies any injury or trauma. Denies loss of bowel or bladder function, weakness, numbness, history of malignancy.  HPI  Past Medical History:  Diagnosis Date  . CHF (congestive heart failure) ([place])   . COPD (chronic obstructive pulmonary disease) ([place])   . Panic attacks   . Tobacco abuse     Patient Active Problem List   Diagnosis Date Noted  . Elevated troponin I level   . SOB (shortness of breath) 10/12/2015  . Troponin level elevated 10/12/2015  . Chest pain 10/12/2015  . Cocaine abuse 10/12/2015  . Cocaine abuse 10/09/2015  . Homeless single person   . Syncope and collapse 10/08/2015  . SOB (shortness of breath) 10/08/2015  . Chest pain 10/08/2015  . COPD (chronic obstructive pulmonary disease) ([place]) 10/08/2015  . Acute bronchitis 10/08/2015    Past Surgical History:  Procedure Laterality Date  . CARDIAC CATHETERIZATION N/A 10/14/2015   Procedure: Left Heart Cath and Coronary Angiography;  Surgeon: Sherren Mocha, MD;  Location: [facility];  Service: Cardiovascular;  Laterality: N/A;  . FOOT SURGERY Right   . HAND SURGERY    . ORIF METACARPAL FRACTURE Left 2011  . ROTATOR CUFF REPAIR Right   . SHOULDER SURGERY         Home Medications    Prior to Admission medications     Medication Sig Start Date End Date Taking? Authorizing Provider  ibuprofen (ADVIL,MOTRIN) 200 MG tablet Take 200 mg by mouth every 6 (six) hours as needed for moderate pain.   Yes [provider]  albuterol (PROVENTIL HFA;VENTOLIN HFA) 108 (90 Base) MCG/ACT inhaler Inhale 2 puffs into the lungs every 6 (six) hours as needed for wheezing or shortness of breath. Patient not taking: Reported on 02/16/2017 10/11/15   Caren Griffins, MD  cyclobenzaprine (FLEXERIL) 10 MG tablet Take 1 tablet (10 mg total) by mouth 2 (two) times daily as needed for muscle spasms. Patient not taking: Reported on 02/16/2017 02/06/17   Ocie Cornfield T, PA-C  guaiFENesin-dextromethorphan (ROBITUSSIN DM) 100-10 MG/5ML syrup Take 5 mLs by mouth every 4 (four) hours as needed for cough. Patient not taking: Reported on 02/06/2017 10/11/15   Caren Griffins, MD  ibuprofen (ADVIL,MOTRIN) 600 MG tablet Take 1 tablet (600 mg total) by mouth 3 (three) times daily. Patient not taking: Reported on 02/16/2017 01/28/17   Caccavale, Sophia, PA-C  methocarbamol (ROBAXIN) 500 MG tablet Take 1 tablet (500 mg total) by mouth 2 (two) times daily as needed for muscle spasms. Patient not taking: Reported on 02/06/2017 01/28/17   Caccavale, Sophia, PA-C  nicotine (EQ NICOTINE) 21 mg/24hr patch Place 1 patch (21 mg total) onto the skin daily. Patient not taking: Reported on 02/06/2017 10/11/15   Caren Griffins, MD  polyethylene glycol (MIRALAX / GLYCOLAX) packet Take 17 g by mouth daily. Patient not taking: Reported on 02/16/2017 02/06/17   Doristine Devoid, PA-C    Family History Family History  Problem Relation Age of Onset  . Hypertension Mother   . Cancer Other   . Stroke Other   . Coronary artery disease Other     Social History Social History  Substance Use Topics  . Smoking status: Current Every Day Smoker    Packs/day: 0.50    Types: Cigarettes  . Smokeless tobacco: Never Used  . Alcohol use Yes     Comment:  Daily. Heavy.      Allergies   Penicillins and Penicillins   Review of Systems Review of Systems  Constitutional: Positive for chills and fever. Negative for diaphoresis, fatigue and unexpected weight change.  HENT: Negative for ear pain and sore throat.   Eyes: Negative for pain and visual disturbance.  Respiratory: Positive for cough, chest tightness and shortness of breath. Negative for choking, wheezing and stridor.   Cardiovascular: Negative for chest pain, palpitations and leg swelling.  Gastrointestinal: Positive for nausea. Negative for abdominal distention, abdominal pain, blood in stool, diarrhea and vomiting.       Patient reportedly 1 episode of nausea earlier but not at this time.  Genitourinary: Negative for difficulty urinating, dysuria, flank pain, frequency and hematuria.  Musculoskeletal: Positive for back pain. Negative for arthralgias, gait problem, joint swelling, neck pain and neck stiffness.  Skin: Negative for color change, pallor and rash.  Neurological: Negative for dizziness, seizures, syncope, facial asymmetry, weakness, light-headedness, numbness and headaches.     Physical Exam Updated Vital Signs BP (!) 142/82   Pulse (!) 59   Temp 97.7 F (36.5 C) (Oral)   Resp (!) 31   SpO2 96%   Physical Exam  Constitutional: He appears well-developed and well-nourished. No distress.  Afebrile, nontoxic appearing, lying comfortably in bed in no acute distress.  HENT:  Head: Normocephalic and atraumatic.  Eyes: Conjunctivae and EOM are normal.  Neck: Neck supple.  Cardiovascular: Normal rate, regular rhythm, normal heart sounds and intact distal pulses.   No murmur heard. Pulmonary/Chest: Effort normal. No respiratory distress. He has wheezes. He has no rales. He exhibits no tenderness.  Abdominal: Soft. He exhibits no mass. There is no tenderness. There is no rebound and no guarding.  Musculoskeletal: Normal range of motion. He exhibits tenderness. He  exhibits no edema.  Midline tenderness to palpation of the lumbar spine and paraspinal musculature. Tenderness palpation over the lower intercostal muscles bilaterally  Neurological: He is alert. No sensory deficit. He exhibits normal muscle tone.  5 out of 5 strain to hip flexion, knee flexion and extension, plantar flexion and dorsiflexion. Neurovascularly intact  Skin: Skin is warm and dry. No rash noted. He is not diaphoretic. No erythema. No pallor.  Psychiatric: He has a normal mood and affect.  Nursing note and vitals reviewed.    ED Treatments / Results  Labs (all labs ordered are listed, but only abnormal results are displayed) Labs Reviewed  CBC WITH DIFFERENTIAL/PLATELET - Abnormal; Notable for the following:       Result Value   RBC 4.00 (*)    MCV 103.3 (*)    MCH 36.3 (*)    All other components within normal limits  COMPREHENSIVE METABOLIC PANEL - Abnormal; Notable for the following:    Sodium 133 (*)    Potassium 3.1 (*)    Chloride 96 (*)  Glucose, Bld 135 (*)    BUN <5 (*)    Calcium 8.8 (*)    Total Protein 8.2 (*)    Albumin 3.2 (*)    All other components within normal limits  BRAIN NATRIURETIC PEPTIDE  SEDIMENTATION RATE  C-REACTIVE PROTEIN  I-STAT TROPONIN, ED    EKG  EKG Interpretation  Date/Time:  Friday February 15 2017 23:43:24 EDT Ventricular Rate:  85 PR Interval:  118 QRS Duration: 86 QT Interval:  392 QTC Calculation: 466 R Axis:   78 Text Interpretation:  Sinus rhythm with Premature atrial complexes Moderate voltage criteria for LVH, may be normal variant ST & T wave abnormality, consider lateral ischemia Abnormal ECG No significant change was found Confirmed by Jola Schmidt [phone]) on 02/16/2017 7:42:18 AM       Radiology Dg Chest 2 View  Result Date: 02/16/2017 CLINICAL DATA:  51 y/o M; shortness of breath with productive cough. EXAM: CHEST  2 VIEW COMPARISON:  02/06/2017 chest radiograph FINDINGS: The heart size and  mediastinal contours are within normal limits. Bronchitic changes in the left infrahilar region. No focal consolidation. The visualized skeletal structures are unremarkable. IMPRESSION: Bronchitic changes in left infrahilar region. No focal consolidation. Electronically Signed   By: Kristine Garbe M.D.   On: 02/16/2017 00:18   Mr Lumbar Spine W Wo Contrast  Result Date: 02/16/2017 CLINICAL DATA:  Increasing back pain over the last 3 months. IV drug abuse. Cauda equina syndrome suspected. EXAM: MRI LUMBAR SPINE WITHOUT AND WITH CONTRAST TECHNIQUE: Multiplanar and multiecho pulse sequences of the lumbar spine were obtained without and with intravenous contrast. CONTRAST:  <20 ml > MULTIHANCE GADOBENATE DIMEGLUMINE 529 MG/ML IV SOLN COMPARISON:  Acute abdominal series done 02/06/2017. abdominopelvic CT 02/06/2017. FINDINGS: Segmentation: Conventional anatomy assumed, with the last open disc space designated L5-S1. This correlates with prior imaging. Alignment: Mild straightening. There are bilateral L5 pars defects with 5 mm of anterolisthesis at L5-S1. Vertebrae: The marrow signal is diffusely heterogeneous on T2 and T1 weighted images. As above, there are chronic L5 pars defects. There are changes of diskitis and osteomyelitis at T11-12 with narrowing, T2 hyperintensity and enhancement of the intervertebral disc. There is also bone marrow edema and enhancement within the T11 and T12 vertebral bodies. Of note, the most superior visualization on this lumbar spine study is at the superior aspect of the T11 vertebral body. There are no changes of discitis or osteomyelitis in the lumbar spine. The visualized sacroiliac joints appear unremarkable. Conus medullaris: Extends to the T12 level and appears normal. No abnormal intradural enhancement or distal cord compression identified. Paraspinal and other soft tissues: There is paraspinal inflammation and enhancement at T11-12. No epidural fluid collection is  identified. Disc levels: T11-12: As above, changes of diskitis and osteomyelitis with paraspinal inflammation. There is associated disc bulging with mild mass effect on the thecal sac. No foraminal compromise or nerve root encroachment seen. T12-L1: Disc and endplate degeneration with anterior osteophytes and mild disc bulging. This level was not imaged in the axial plane. No evidence of nerve root encroachment. No significant acquired findings from L1-2 through L3-4. L4-5: Disc height and hydration are maintained. Minimal disc bulging and mild facet hypertrophy. No spinal stenosis or nerve root encroachment. L5-S1: The disc is degenerated with annular bulging and uncovering related to the pars defects and resulting anterolisthesis. The spinal canal and lateral recesses are patent. There is moderate foraminal narrowing, right-greater-than-left, with possible bilateral L5 nerve root encroachment. IMPRESSION: 1. Findings are consistent  with discitis and osteomyelitis at the T11-12 level. The superior extent of this may be incompletely visualized by this examination. 2. No conus compression or abnormal intradural enhancement identified. 3. 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 Electronically Signed   By: Richardean Sale M.D.   On: 02/16/2017 11:31    Procedures Procedures (including critical care time)  Medications Ordered in ED Medications  piperacillin-tazobactam (ZOSYN) IVPB 3.375 g (not administered)  oxyCODONE-acetaminophen (PERCOCET/ROXICET) 5-325 MG per tablet 1 tablet (1 tablet Oral Given 02/15/17 2351)  potassium chloride SA (K-DUR,KLOR-CON) CR tablet 40 mEq (40 mEq Oral Given 02/16/17 0734)  ketorolac (TORADOL) injection 30 mg (30 mg Intramuscular Given 02/16/17 0734)  albuterol (PROVENTIL) (2.5 MG/3ML) 0.083% nebulizer solution 5 mg (5 mg Nebulization Given 02/16/17 0735)    ipratropium (ATROVENT) nebulizer solution 0.5 mg (0.5 mg Nebulization Given 02/16/17 0735)  HYDROmorphone (DILAUDID) injection 1 mg (1 mg Intravenous Given 02/16/17 0933)  gadobenate dimeglumine (MULTIHANCE) injection 20 mL (20 mLs Intravenous Contrast Given 02/16/17 1113)  HYDROmorphone (DILAUDID) injection 1 mg (1 mg Intravenous Given 02/16/17 1318)  sodium chloride 0.9 % bolus 1,000 mL (1,000 mLs Intravenous New Bag/Given 02/16/17 1318)     Initial Impression / Assessment and Plan / ED Course  I have reviewed the triage vital signs and the nursing notes.  Pertinent labs & imaging results that were available during my care of the patient were reviewed by me and considered in my medical decision making (see chart for details).    Patient presenting with 3 weeks of back pain that has been evaluated twice in the emergency department. Previous x-ray showing L5 discopathy that appears to be worsening from prior. No focal deficits. No red flags. Hypokalemia: Patient was given potassium in the emergency department.  She reported some relief with Percocet and stated that it was starting to were often needs something for pain. He is also requesting a Coke and reports that his nausea has subsided. He was given Toradol for pain.  Productive cough, chest x-ray without evidence of pneumonia, will treat his COPD exacerbation Patient given nebulizing treatment, will reassess  On reassessment, patient reported improvement in his breathing, no nausea, some improvement of back pain after Toradol. patient was given Dilaudid and reported improvement. Pain was managed while in the emergency department. Given history of IV drug use with last use 3 weeks ago, he is at greater risk of tdeveloping spinal abscess will order  MRI.  MRI showing osteomyelitis and discitis. No abscess. IV Zosyn and vancomycin initiated in emergency department.  Called for admission and spoke to Dr. Garlan Fillers admitting physician who  accepted patient. Final Clinical Impressions(s) / ED Diagnoses   Final diagnoses:  Chronic bilateral low back pain without sciatica  Other acute osteomyelitis, other site [facility])    New Prescriptions New Prescriptions   No medications on file     Dossie Der 02/16/17 1405    Jola Schmidt, MD 02/16/17 Dara Lords, MD 02/21/17 [phone]

## 2017-02-16 NOTE — H&P (Signed)
[facility] Admission History and Physical Service Pager: [phone]  Patient name: [name] Medical record number: [id number] Date of birth: 1966-02-13 Age: 51 y.o. Gender: male  Primary Care Provider: Patient, No Pcp Per Consultants: None Code Status: FULL  Chief Complaint: back pain  Assessment and Plan: [name] is a 51 y.o. male presenting with back pain . PMH is significant for substance abuse (cocaine, heroin). Also with reported prior diagnosis of COPD, CHF, and pulm HTN.   Chronic Back Pain: Likely 2/2 osteomyelitis as shown on MRI lumbar spine. Could also be due to spinal/epidural abscess. Unlikely to be due to herniated disc vs muscular strain given imaging and no preceding trauma. Patient states for the past 3 weeks he has had increased back pain. He did endorse being an IV drug user to ED provider.  - Admit to inpatient, Dr. Erin Hearing - Vitals per floor - CBC, BMP pending - BCx x 2 - Lactic acid - Thoracic MRI to rule out abscess in the thoracic spine. As patient recently received contrast for L spine MRI, will have to wait 12 hours before additional MRI can be performed (must be performed without contrast, otherwise wait 48 hrs).   Hx of Polysubstance Abuse: Patient endorsed using cocaine to myself and history of marijuana. Endorsed IV drug use to ED provider. - UDS - Hepatitis panel - HIV screen  Hx of Tobacco Use Disorder. Patient has long history of tobacco use. Currently smokes  - Provided counseling on quitting and health benefits of stopping smoking. - 14mg  Nicotine patch  Hx of Alcohol Use Disorder: Patient has history of heavy alcohol intake - CIWA protocol  CHF: Patient states he was diagnosed with CHF in prison recently. He states he is not currently taking any medications and was never started on any. He is not having any active signs or symptoms of CHF exacerbation at this time. - Consider echo  cardiogram if patient remains stable - Consider starting him on optimal therapy.  FEN/GI: Regular diet Prophylaxis: SCDs  Disposition: Admit to FPTS  History of Present Illness:  [name] is a 51 y.o. male presenting with back pain.   Patient reports severe back pain for at least 3 weeks. Describes pain as feeling as if someone is sticking a knife into his spine. No preceding trauma. Pain located at midline in lower back/sacral region. Currently rates pain 5/10 after receiving Dilaudid ~20 minutes ago. Endorses subjective fever yesterday with accompanying diaphoresis, as well as nausea. Denies vomiting. Also endorses SOB and productive cough, though this is not a new issue. Denies changes in vision, dizziness, or syncope.  Of note, patient endorsing recent cocaine use. Denies IV drug use to this provider, however UDS positive for opiates and cocaine and patient endorsing heroin use 3 weeks ago to ED provider.  Previously incarcerated and currently homeless. Is living out of his truck. Says he was diagnosed with pulmonary HTN and CHF in prison. Also says he has been diagnosed with COPD in the past. Is not currently taking any medications.    Review Of Systems: Per HPI with the following additions:   Review of Systems  Constitutional: Positive for diaphoresis and fever (Subjective).  Eyes: Negative for blurred vision and double vision.  Respiratory: Positive for cough, sputum production and shortness of breath.   Gastrointestinal: Positive for nausea. Negative for vomiting.  Musculoskeletal: Positive for back pain.  Neurological: Positive for dizziness. Negative for loss of consciousness.  Psychiatric/Behavioral: Positive  for substance abuse (Endorses cocaine. Opiates and cocaine noted on UDS. ).    Patient Active Problem List   Diagnosis Date Noted  . Elevated troponin I level   . SOB (shortness of breath) 10/12/2015  . Troponin level elevated 10/12/2015  . Chest pain  10/12/2015  . Cocaine abuse 10/12/2015  . Cocaine abuse 10/09/2015  . Homeless single person   . Syncope and collapse 10/08/2015  . SOB (shortness of breath) 10/08/2015  . Chest pain 10/08/2015  . COPD (chronic obstructive pulmonary disease) ([place]) 10/08/2015  . Acute bronchitis 10/08/2015    Past Medical History: Past Medical History:  Diagnosis Date  . CHF (congestive heart failure) ([place])   . COPD (chronic obstructive pulmonary disease) ([place])   . Panic attacks   . Tobacco abuse     Past Surgical History: Past Surgical History:  Procedure Laterality Date  . CARDIAC CATHETERIZATION N/A 10/14/2015   Procedure: Left Heart Cath and Coronary Angiography;  Surgeon: Sherren Mocha, MD;  Location: [facility];  Service: Cardiovascular;  Laterality: N/A;  . FOOT SURGERY Right   . HAND SURGERY    . ORIF METACARPAL FRACTURE Left 2011  . ROTATOR CUFF REPAIR Right   . SHOULDER SURGERY      Social History: Social History  Substance Use Topics  . Smoking status: Current Every Day Smoker    Packs/day: 0.50    Types: Cigarettes  . Smokeless tobacco: Never Used  . Alcohol use Yes     Comment: Daily. Heavy.    Additional social history: Patient homeless and unemployed. Previously worked as Hydrologist. Has been sleeping in his truck for past month. IV drug user. Previously incarcerated.  Began smoking at age 67yo. Recent cocaine use.  Please also refer to relevant sections of EMR.  Family History: Family History  Problem Relation Age of Onset  . Hypertension Mother   . Cancer Other   . Stroke Other   . Coronary artery disease Other      Allergies and Medications: Allergies  Allergen Reactions  . Penicillins Other (See Comments)    Has patient had a PCN reaction causing immediate rash, facial/tongue/throat swelling, SOB or lightheadedness with hypotension: Unknow Has patient had a PCN reaction causing severe rash involving mucus membranes or skin necrosis:  Unknown Has patient had a PCN reaction that required hospitalization unknown Has patient had a PCN reaction occurring within the last 10 years: No If all of the above answers are "NO", then may proceed with Cephalosporin use.   [name] Penicillins Other (See Comments)   No current facility-administered medications on file prior to encounter.    Current Outpatient Prescriptions on File Prior to Encounter  Medication Sig Dispense Refill  . ibuprofen (ADVIL,MOTRIN) 200 MG tablet Take 200 mg by mouth every 6 (six) hours as needed for moderate pain.    [name] albuterol (PROVENTIL HFA;VENTOLIN HFA) 108 (90 Base) MCG/ACT inhaler Inhale 2 puffs into the lungs every 6 (six) hours as needed for wheezing or shortness of breath. (Patient not taking: Reported on 02/16/2017) 1 Inhaler 2  . cyclobenzaprine (FLEXERIL) 10 MG tablet Take 1 tablet (10 mg total) by mouth 2 (two) times daily as needed for muscle spasms. (Patient not taking: Reported on 02/16/2017) 10 tablet 0  . guaiFENesin-dextromethorphan (ROBITUSSIN DM) 100-10 MG/5ML syrup Take 5 mLs by mouth every 4 (four) hours as needed for cough. (Patient not taking: Reported on 02/06/2017) 118 mL 0  . ibuprofen (ADVIL,MOTRIN) 600 MG tablet Take 1 tablet (  600 mg total) by mouth 3 (three) times daily. (Patient not taking: Reported on 02/16/2017) 30 tablet 0  . methocarbamol (ROBAXIN) 500 MG tablet Take 1 tablet (500 mg total) by mouth 2 (two) times daily as needed for muscle spasms. (Patient not taking: Reported on 02/06/2017) 10 tablet 0  . nicotine (EQ NICOTINE) 21 mg/24hr patch Place 1 patch (21 mg total) onto the skin daily. (Patient not taking: Reported on 02/06/2017) 28 patch 0  . polyethylene glycol (MIRALAX / GLYCOLAX) packet Take 17 g by mouth daily. (Patient not taking: Reported on 02/16/2017) 14 each 0    Objective: BP (!) 142/82   Pulse (!) 59   Temp 97.7 F (36.5 C) (Oral)   Resp (!) 31   SpO2 96%  Exam: General: Alert and Oriented x 3, NAD Eyes: PERRL,  EOMI ENTM: Normal pharyngeal mucosa, no exudates Neck: No LAD, no thyroidmegaly Cardiovascular: RRR, normal S1, S2, no murmurs, gallops Respiratory: CTAB, no wheezing, no crackles Gastrointestinal: non-distended, soft, non-tender, +bs MSK: FROM in all extremities, TTP along the lower lumbar L3-S1 spinous processes Derm: warm, dry, intact, no rashes Neuro: CN II-XII grossly intact Psych: normal mood, appropriate behavior  Labs and Imaging: CBC BMET   Recent Labs Lab 02/15/17 2358  WBC 7.9  HGB 14.5  HCT 41.3  PLT 282    Recent Labs Lab 02/15/17 2358  NA 133*  K 3.1*  CL 96*  CO2 29  BUN <5*  CREATININE 0.86  GLUCOSE 135*  CALCIUM 8.8*     Dg Chest 2 View  Result Date: 02/16/2017 CLINICAL DATA:  51 y/o M; shortness of breath with productive cough. EXAM: CHEST  2 VIEW COMPARISON:  02/06/2017 chest radiograph FINDINGS: The heart size and mediastinal contours are within normal limits. Bronchitic changes in the left infrahilar region. No focal consolidation. The visualized skeletal structures are unremarkable. IMPRESSION: Bronchitic changes in left infrahilar region. No focal consolidation. Electronically Signed   By: Kristine Garbe M.D.   On: 02/16/2017 00:18   Mr Lumbar Spine W Wo Contrast  Result Date: 02/16/2017 CLINICAL DATA:  Increasing back pain over the last 3 months. IV drug abuse. Cauda equina syndrome suspected. EXAM: MRI LUMBAR SPINE WITHOUT AND WITH CONTRAST TECHNIQUE: Multiplanar and multiecho pulse sequences of the lumbar spine were obtained without and with intravenous contrast. CONTRAST:  <20 ml > MULTIHANCE GADOBENATE DIMEGLUMINE 529 MG/ML IV SOLN COMPARISON:  Acute abdominal series done 02/06/2017. abdominopelvic CT 02/06/2017. FINDINGS: Segmentation: Conventional anatomy assumed, with the last open disc space designated L5-S1. This correlates with prior imaging. Alignment: Mild straightening. There are bilateral L5 pars defects with 5 mm of  anterolisthesis at L5-S1. Vertebrae: The marrow signal is diffusely heterogeneous on T2 and T1 weighted images. As above, there are chronic L5 pars defects. There are changes of diskitis and osteomyelitis at T11-12 with narrowing, T2 hyperintensity and enhancement of the intervertebral disc. There is also bone marrow edema and enhancement within the T11 and T12 vertebral bodies. Of note, the most superior visualization on this lumbar spine study is at the superior aspect of the T11 vertebral body. There are no changes of discitis or osteomyelitis in the lumbar spine. The visualized sacroiliac joints appear unremarkable. Conus medullaris: Extends to the T12 level and appears normal. No abnormal intradural enhancement or distal cord compression identified. Paraspinal and other soft tissues: There is paraspinal inflammation and enhancement at T11-12. No epidural fluid collection is identified. Disc levels: T11-12: As above, changes of diskitis and osteomyelitis with paraspinal  inflammation. There is associated disc bulging with mild mass effect on the thecal sac. No foraminal compromise or nerve root encroachment seen. T12-L1: Disc and endplate degeneration with anterior osteophytes and mild disc bulging. This level was not imaged in the axial plane. No evidence of nerve root encroachment. No significant acquired findings from L1-2 through L3-4. L4-5: Disc height and hydration are maintained. Minimal disc bulging and mild facet hypertrophy. No spinal stenosis or nerve root encroachment. L5-S1: The disc is degenerated with annular bulging and uncovering related to the pars defects and resulting anterolisthesis. The spinal canal and lateral recesses are patent. There is moderate foraminal narrowing, right-greater-than-left, with possible bilateral L5 nerve root encroachment. IMPRESSION: 1. Findings are consistent with discitis and osteomyelitis at the T11-12 level. The superior extent of this may be incompletely  visualized by this examination. 2. No conus compression or abnormal intradural enhancement identified. 3. 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 Electronically Signed   By: Richardean Sale M.D.   On: 02/16/2017 11:31    Nuala Alpha, DO 02/16/2017, 1:43 PM PGY-1, [place] [facility] pager: [phone], text pages welcome  UPPER LEVEL ADDENDUM  I have read the above note and made revisions highlighted in orange.  Adin Hector, MD, MPH PGY-3 [facility] Pager [phone]

## 2017-02-16 NOTE — ED Notes (Signed)
ED Provider at bedside. 

## 2017-02-16 NOTE — Discharge Summary (Signed)
[facility] Discharge Summary  Patient name: [name] Medical record number: [id number] Date of birth: 03-29-1966 Age: 51 y.o. Gender: male Date of Admission: 02/16/2017  Date of Discharge: 02/21/17 Admitting Physician: Lind Covert, MD  Primary Care Provider: Patient, No Pcp Per Consultants: ID, Dr. Daryll Drown (suboxone), Case management and social work  Indication for Hospitalization: back pain 2/2 osteomyelitis  Discharge Diagnoses/Problem List:  Patient Active Problem List   Diagnosis Date Noted  . Bacteremia   . Diskitis   . IVDU (intravenous drug user)   . Chronic bilateral low back pain without sciatica   . 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 Vertebral osteomyelitis ([place]) 02/16/2017  . Elevated troponin I level   . SOB (shortness of breath) 10/12/2015  . Troponin level elevated 10/12/2015  . Chest pain 10/12/2015  . Cocaine abuse ([place]) 10/12/2015  . Cocaine abuse ([place]) 10/09/2015  . Homeless single person   . Syncope and collapse 10/08/2015  . SOB (shortness of breath) 10/08/2015  . Chest pain 10/08/2015  . COPD (chronic obstructive pulmonary disease) ([place]) 10/08/2015  . Acute bronchitis 10/08/2015    Disposition: to community (patient is homeless)  Discharge Condition: Stable  Discharge Exam: General: Obese male, uncomforably turning in bed once awakened Cardiovascular: RRR, S1S2, no mrg Respiratory: Lungs sounds clear bilaterally with repositioning, no wheezing noted Abdomen: no belly pain, obese, +BS, NT Neuro: Fully awake and conversational during exam with gross motor control intact Extremities: Moves all spontaneously  Brief Hospital Course:  Patient admitted with 3 wk history of back pain. MRI was performed in ED, which was suggestive of osteomyelitis in T11-12. Patient was subsequently started on vanc/cefepime due to PCN allergy. Patient is active IVDU (heroin), which was thought to be likely source of osteo.    CT  indicated T11-12 osteomyelitis/discitis/phlegmon.   Neurosurgery was consulted and felt it was not a surgical issue at this point.  Disc culture was taken and showed Candida so abx were cancelled and fluconazole was started at 800mg  daily.   Plan was to decrease to 400mg  daily but subsequent TEE showed endocarditis so patient is being discharged with plan of 800mg  fluconazole for 6 months to 1 yr.  Issues for Follow Up:  1. Candida osteomyeleits/discitis/phlegmon at T11/12, will need 25months to 1year fluconazole 800mg . 2. Neurosurgery suggest followup spine MRI third week of Oct 2018. 3. Dr. Daryll Drown was attempting to connect patient with suboxone treatment for heroin abuse, please help coordinate. 4. Case manager contacted [facility] [phone], scheduled patient followup appointment for Monday, March 11, 2017 at 2:30pm. Case manager will provide [facility] letter when patient ready for discharge. 5. Patient had a positive HepC result on their day of discharge, please discuss treatment options with them.  Significant Procedures: TEE, spine MRI  Significant Labs and Imaging:   Recent Labs Lab 02/17/17 0452 02/18/17 0554 02/21/17 0518  WBC 8.0 6.2 6.9  HGB 14.1 13.9 14.9  HCT 41.1 40.5 44.0  PLT 285 292 328    Recent Labs Lab 02/15/17 2358 02/16/17 1455 02/17/17 0452 02/18/17 0554 02/19/17 1011 02/21/17 0518  NA 133* 137 133* 133* 135 135  K 3.1* 3.7 3.6 3.2* 3.8 3.8  CL 96* 104 99* 96* 96* 96*  CO2 29 29 28 31  33* 30  GLUCOSE 135* 112* 155* 181* 141* 147*  BUN <5* 8 7 8 6 9   CREATININE 0.86 0.84 0.86 0.76 0.74 0.77  CALCIUM 8.8* 8.7* 8.5* 8.5* 8.7* 9.1  ALKPHOS  53 50  --   --   --   --   AST 34 33  --   --   --   --   ALT 53 47  --   --   --   --   ALBUMIN 3.2* 2.9*  --   --   --   --     Results/Tests Pending at Time of Discharge: N/A  Discharge Medications:  Allergies as of 02/21/2017      Reactions   Penicillins Other (See Comments)   Has  patient had a PCN reaction causing immediate rash, facial/tongue/throat swelling, SOB or lightheadedness with hypotension: Unknow Has patient had a PCN reaction causing severe rash involving mucus membranes or skin necrosis: Unknown Has patient had a PCN reaction that required hospitalization unknown Has patient had a PCN reaction occurring within the last 10 years: No If all of the above answers are "NO", then may proceed with Cephalosporin use.   Penicillins Other (See Comments)      Medication List    STOP taking these medications   methocarbamol 500 MG tablet Commonly known as:  ROBAXIN     TAKE these medications   acetaminophen 325 MG tablet Commonly known as:  TYLENOL Take 1 tablet (325 mg total) by mouth every 6 (six) hours as needed.   albuterol 108 (90 Base) MCG/ACT inhaler Commonly known as:  PROVENTIL HFA;VENTOLIN HFA Inhale 2 puffs into the lungs every 6 (six) hours as needed for wheezing or shortness of breath.   buprenorphine-naloxone 8-2 mg Subl SL tablet Commonly known as:  SUBOXONE Place 1 tablet under the tongue 2 (two) times daily.   cyclobenzaprine 10 MG tablet Commonly known as:  FLEXERIL Take 1 tablet (10 mg total) by mouth 3 (three) times daily. What changed:  when to take this  reasons to take this   fluconazole 200 MG tablet Commonly known as:  DIFLUCAN Take 4 tablets (800 mg total) by mouth daily.   fluconazole 200 MG tablet Commonly known as:  DIFLUCAN Take 4 tablets (800 mg total) by mouth daily.   folic acid 1 MG tablet Commonly known as:  FOLVITE Take 1 tablet (1 mg total) by mouth daily.   guaiFENesin-dextromethorphan 100-10 MG/5ML syrup Commonly known as:  ROBITUSSIN DM Take 5 mLs by mouth every 4 (four) hours as needed for cough.   ibuprofen 200 MG tablet Commonly known as:  ADVIL,MOTRIN Take 200 mg by mouth every 6 (six) hours as needed for moderate pain. What changed:  Another medication with the same name was removed.  Continue taking this medication, and follow the directions you see here.   lisinopril 20 MG tablet Commonly known as:  PRINIVIL,ZESTRIL Take 1 tablet (20 mg total) by mouth daily.   multivitamin with minerals Tabs tablet Take 1 tablet by mouth daily.   nicotine 21 mg/24hr patch Commonly known as:  EQ NICOTINE Place 1 patch (21 mg total) onto the skin daily.   polyethylene glycol packet Commonly known as:  MIRALAX / GLYCOLAX Take 17 g by mouth daily.   thiamine 100 MG tablet Take 1 tablet (100 mg total) by mouth daily.       Discharge Instructions: Please refer to Patient Instructions section of EMR for full details.  Patient was counseled important signs and symptoms that should prompt return to medical care, changes in medications, dietary instructions, activity restrictions, and follow up appointments.   Follow-Up Appointments: Follow-up [facility]. Schedule an appointment as soon as possible for a visit in 3 day(s).   Contact information: [address] [place] [address] [phone]       Go to [facility].   Specialty:  Emergency Medicine Why:  If symptoms worsen Contact information: [address] [id number] [place]. Go to.   Why:  You have an appointment on Monday, March 11, 2017 at 2:30pm Contact information: [place] [address] [place]. Go in 7 day(s).   Why:  Come to your appointment at 10:30am on Friday 10/12.  Please call if you need directions Contact information: [address] [place], [place], DO 02/21/2017, 6:27 PM PGY-1, [facility]

## 2017-02-16 NOTE — Progress Notes (Signed)
Patient c/o back pain 10/10. Offered Toradol, patient asked "isToradol is a narcotic?" and my response was no. He declined to take Toradol and asked that I page the Doctor. Same done.

## 2017-02-16 NOTE — ED Notes (Signed)
MRI called and stated that the patient could not have another mri with contrast for 48 hours due to just having one, admitting paged.

## 2017-02-16 NOTE — Progress Notes (Signed)
Pharmacy Antibiotic Note  [name] is a 51 y.o. male admitted on 02/16/2017 with osteomyelitis of spine as evidenced on MRI. Pharmacy has been consulted for Vancomycin and Cefepime dosing. WBC wnl. SCr wnl. Weight on 01/28/17 was 99.7 kg.   Plan: Vancomycin 2g IV x1 then 1g IV every 8 hours Cefepime 2g IV every 8 hours.  Monitor renal function, culture results and clinical status.     Temp (24hrs), Avg:98.1 F (36.7 C), Min:97.7 F (36.5 C), Max:98.8 F (37.1 C)   Recent Labs Lab 02/15/17 2358  WBC 7.9  CREATININE 0.86    CrCl cannot be calculated (Unknown ideal weight.).    Allergies  Allergen Reactions  . Penicillins Other (See Comments)    Has patient had a PCN reaction causing immediate rash, facial/tongue/throat swelling, SOB or lightheadedness with hypotension: Unknow Has patient had a PCN reaction causing severe rash involving mucus membranes or skin necrosis: Unknown Has patient had a PCN reaction that required hospitalization unknown Has patient had a PCN reaction occurring within the last 10 years: No If all of the above answers are "NO", then may proceed with Cephalosporin use.   [name] Penicillins Other (See Comments)  - childhood allergy, unknown reaction  Antimicrobials this admission: Vancomycin 9/29 >>  Cefepime 9/29 >>  Dose adjustments this admission:   Microbiology results: none  Thank you for allowing pharmacy to be a part of this patient's care.  Sloan Leiter, PharmD, BCPS Clinical Pharmacist Clinical phone 02/16/2017 until 3:30PM [phone] After hours, please call [phone] 02/16/2017 2:00 PM

## 2017-02-16 NOTE — ED Notes (Signed)
Patient transported to MRI 

## 2017-02-16 NOTE — ED Notes (Signed)
Lunch tray ordered; regular diet 

## 2017-02-17 ENCOUNTER — Inpatient Hospital Stay (HOSPITAL_COMMUNITY): Payer: Self-pay

## 2017-02-17 ENCOUNTER — Encounter (HOSPITAL_COMMUNITY): Payer: Self-pay | Admitting: General Surgery

## 2017-02-17 DIAGNOSIS — R768 Other specified abnormal immunological findings in serum: Secondary | ICD-10-CM

## 2017-02-17 DIAGNOSIS — M4644 Discitis, unspecified, thoracic region: Secondary | ICD-10-CM

## 2017-02-17 DIAGNOSIS — F17210 Nicotine dependence, cigarettes, uncomplicated: Secondary | ICD-10-CM

## 2017-02-17 DIAGNOSIS — F1910 Other psychoactive substance abuse, uncomplicated: Secondary | ICD-10-CM

## 2017-02-17 DIAGNOSIS — Z590 Homelessness: Secondary | ICD-10-CM

## 2017-02-17 DIAGNOSIS — I10 Essential (primary) hypertension: Secondary | ICD-10-CM

## 2017-02-17 DIAGNOSIS — Z809 Family history of malignant neoplasm, unspecified: Secondary | ICD-10-CM

## 2017-02-17 DIAGNOSIS — Z8249 Family history of ischemic heart disease and other diseases of the circulatory system: Secondary | ICD-10-CM

## 2017-02-17 DIAGNOSIS — G8929 Other chronic pain: Secondary | ICD-10-CM

## 2017-02-17 DIAGNOSIS — Z823 Family history of stroke: Secondary | ICD-10-CM

## 2017-02-17 HISTORY — PX: IR FLUORO GUIDED NEEDLE PLC ASPIRATION/INJECTION LOC: IMG2395

## 2017-02-17 LAB — HEPATITIS PANEL, ACUTE
HCV Ab: >11 s/co ratio — ABNORMAL HIGH (ref 0.0–0.9)
HEP B C IGM: NEGATIVE
HEP B S AG: NEGATIVE
Hep A IgM: NEGATIVE

## 2017-02-17 LAB — BASIC METABOLIC PANEL
Anion gap: 6 (ref 5–15)
BUN: 7 mg/dL (ref 6–20)
CHLORIDE: 99 mmol/L — AB (ref 101–111)
CO2: 28 mmol/L (ref 22–32)
Calcium: 8.5 mg/dL — ABNORMAL LOW (ref 8.9–10.3)
Creatinine, Ser: 0.86 mg/dL (ref 0.61–1.24)
GFR calc Af Amer: >60 mL/min (ref >60)
GFR calc non Af Amer: >60 mL/min (ref >60)
GLUCOSE: 155 mg/dL — AB (ref 65–99)
POTASSIUM: 3.6 mmol/L (ref 3.5–5.1)
Sodium: 133 mmol/L — ABNORMAL LOW (ref 135–145)

## 2017-02-17 LAB — CBC
HEMATOCRIT: 41.1 % (ref 39.0–52.0)
Hemoglobin: 14.1 g/dL (ref 13.0–17.0)
MCH: 35.8 pg — AB (ref 26.0–34.0)
MCHC: 34.3 g/dL (ref 30.0–36.0)
MCV: 104.3 fL — AB (ref 78.0–100.0)
Platelets: 285 10*3/uL (ref 150–400)
RBC: 3.94 MIL/uL — ABNORMAL LOW (ref 4.22–5.81)
RDW: 12.9 % (ref 11.5–15.5)
WBC: 8 10*3/uL (ref 4.0–10.5)

## 2017-02-17 LAB — HIV ANTIBODY (ROUTINE TESTING W REFLEX): HIV SCREEN 4TH GENERATION: NONREACTIVE

## 2017-02-17 LAB — C-REACTIVE PROTEIN: CRP: 1.5 mg/dL — ABNORMAL HIGH (ref <1.0)

## 2017-02-17 LAB — PROTIME-INR
INR: 1.07
PROTHROMBIN TIME: 13.8 s (ref 11.4–15.2)

## 2017-02-17 LAB — SEDIMENTATION RATE: Sed Rate: 35 mm/hr — ABNORMAL HIGH (ref 0–16)

## 2017-02-17 MED ORDER — DEXTROSE 5 % IV SOLN
2.0000 g | Freq: Three times a day (TID) | INTRAVENOUS | Status: DC
  Administered 2017-02-18 (×2): 2 g via INTRAVENOUS
  Filled 2017-02-17 (×3): qty 2

## 2017-02-17 MED ORDER — LIDOCAINE HCL 1 % IJ SOLN
INTRAMUSCULAR | Status: AC
  Filled 2017-02-17: qty 20

## 2017-02-17 MED ORDER — FENTANYL CITRATE (PF) 100 MCG/2ML IJ SOLN
INTRAMUSCULAR | Status: AC
  Filled 2017-02-17: qty 4

## 2017-02-17 MED ORDER — VANCOMYCIN HCL IN DEXTROSE 1-5 GM/200ML-% IV SOLN
1000.0000 mg | Freq: Three times a day (TID) | INTRAVENOUS | Status: DC
  Administered 2017-02-18 (×2): 1000 mg via INTRAVENOUS
  Filled 2017-02-17 (×3): qty 200

## 2017-02-17 MED ORDER — SODIUM CHLORIDE 0.9 % IV SOLN
100.0000 mg | INTRAVENOUS | Status: DC
  Administered 2017-02-18 – 2017-02-19 (×2): 100 mg via INTRAVENOUS
  Filled 2017-02-17 (×2): qty 100

## 2017-02-17 MED ORDER — LIDOCAINE HCL 1 % IJ SOLN
INTRAMUSCULAR | Status: AC | PRN
  Administered 2017-02-17: 10 mL

## 2017-02-17 MED ORDER — HYDRALAZINE HCL 20 MG/ML IJ SOLN
5.0000 mg | INTRAMUSCULAR | Status: DC | PRN
  Administered 2017-02-17 – 2017-02-20 (×2): 5 mg via INTRAVENOUS
  Filled 2017-02-17 (×2): qty 1

## 2017-02-17 MED ORDER — ENOXAPARIN SODIUM 40 MG/0.4ML ~~LOC~~ SOLN
40.0000 mg | SUBCUTANEOUS | Status: DC
  Administered 2017-02-19 – 2017-02-20 (×2): 40 mg via SUBCUTANEOUS
  Filled 2017-02-17 (×3): qty 0.4

## 2017-02-17 MED ORDER — ACETAMINOPHEN 325 MG PO TABS
650.0000 mg | ORAL_TABLET | Freq: Four times a day (QID) | ORAL | Status: DC
  Administered 2017-02-17 – 2017-02-21 (×14): 650 mg via ORAL
  Filled 2017-02-17 (×14): qty 2

## 2017-02-17 MED ORDER — FENTANYL CITRATE (PF) 100 MCG/2ML IJ SOLN
INTRAMUSCULAR | Status: AC | PRN
  Administered 2017-02-17 (×2): 50 ug via INTRAVENOUS

## 2017-02-17 MED ORDER — SODIUM CHLORIDE 0.9 % IV SOLN
200.0000 mg | Freq: Once | INTRAVENOUS | Status: AC
  Administered 2017-02-17: 200 mg via INTRAVENOUS
  Filled 2017-02-17 (×2): qty 200

## 2017-02-17 NOTE — Progress Notes (Signed)
Pharmacy Antibiotic Note  [name] is a 51 y.o. male admitted on 02/16/2017 with osteomyelitis of spine as evidenced on MRI. Pharmacy has been consulted for Vancomycin and Cefepime dosing.   Patient missed 1 dose of vacnomycin and cefepime today as consults and medications were discontinued, and now being restarted.  Plan: Vancomycin 1g IV q8h Cefepime 2g IV q8h Eraxis 200mg  IV x1, then 100mg  IV q24h  Monitor renal function, culture results and clinical status.     Temp (24hrs), Avg:99.1 F (37.3 C), Min:98.7 F (37.1 C), Max:99.7 F (37.6 C)   Recent Labs Lab 02/15/17 2358 02/16/17 1455 02/16/17 1619 02/16/17 1759 02/17/17 0452  WBC 7.9 5.9  --   --  8.0  CREATININE 0.86 0.84  --   --  0.86  LATICACIDVEN  --   --  1.4 1.3  --     CrCl cannot be calculated (Unknown ideal weight.).    Allergies  Allergen Reactions  . Penicillins Other (See Comments)    Has patient had a PCN reaction causing immediate rash, facial/tongue/throat swelling, SOB or lightheadedness with hypotension: Unknow Has patient had a PCN reaction causing severe rash involving mucus membranes or skin necrosis: Unknown Has patient had a PCN reaction that required hospitalization unknown Has patient had a PCN reaction occurring within the last 10 years: No If all of the above answers are "NO", then may proceed with Cephalosporin use.   [name] Penicillins Other (See Comments)  - childhood allergy, unknown reaction  Antimicrobials this admission: Vancomycin 9/29 >>  Cefepime 9/29 >>  Dose adjustments this admission:   Microbiology results: 9/30 BCx: 9/30 T11-12 disc fluid: 9/30 disc fluid AFB:  Thank you for allowing pharmacy to be a part of this patient's care.  Chavela Justiniano D. Donye Dauenhauer, PharmD, BCPS Clinical Pharmacist 02/17/2017 9:50 PM

## 2017-02-17 NOTE — Progress Notes (Signed)
Patient is calm and resting at this time.

## 2017-02-17 NOTE — Progress Notes (Signed)
On assessment, patient  is restless and CIWA is 15. Patient BP 219/124. Manual BP is 200/122. Patient breathing is labored. 2L O2  [place]  applied.2 mg of Ativan administered.  Family Medicine on-call MD notified. MD noted that Patient BP taken after an hour. Will continue to monitor.

## 2017-02-17 NOTE — Procedures (Signed)
Pre procedural Dx: Discitis/Osteomyelitis Post procedural Dx: Same  Technically successful fluoro guided T11-T12 disc aspiration.  All aspirated samples sent to the laboratory for analysis.    EBL: Trace  Complications: None immediate  Ronny Bacon, MD Pager #: [phone]

## 2017-02-17 NOTE — Consult Note (Signed)
[facility] Infectious [facility]    Date of Admission:  02/16/2017   Total days of antibiotics 0               Reason for Consult: T11-12 discitis   Referring Provider: Ola Spurr   Assessment: T11-12 Discitis IVDA hypertension Homelessness Hep C Ab+ HIV (-)  Plan: 1. Continue vanco/cefepime for now 2. Add eraxis 3. Check TEE 4. Check BCx 5. Check Hep C RNA 6. Detox 7. CSW/Psych eval for rehab (he is interested)   Thank you so much for this interesting consult,  Active Problems:   Osteomyelitis ([place])   . albuterol  5 mg Nebulization Once  . [START ON 02/18/2017] enoxaparin (LOVENOX) injection  40 mg Subcutaneous Q24H  . fentaNYL      . folic acid  1 mg Oral Daily  . ketorolac  30 mg Intramuscular Once  . lidocaine      . lidocaine      . LORazepam  0-4 mg Intravenous Q6H   Followed by  . [START ON 02/18/2017] LORazepam  0-4 mg Intravenous Q12H  . multivitamin with minerals  1 tablet Oral Daily  . nicotine  21 mg Transdermal Daily  . sodium chloride flush  3 mL Intravenous Q12H  . thiamine  100 mg Oral Daily    HPI: [name] is a 51 y.o. male with hx of IVDA, comes to hospital on 9-29 with 3 weeks of increasing back pain as well as fever.  In ED his WBC was normal, he has been afebrile. He has been notably hypertensive. He got vanco/zosyn in ED, then vanco/cefepime. He underwent MRI of his spine showing: 1. Findings are consistent with discitis and osteomyelitis at the T11-12 level. The superior extent of this may be incompletely visualized by this examination. 2. No conus compression or abnormal intradural enhancement identified. 3. 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 Today he had aspirate of his spine.  His gram stain showed rare budding yeast. 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 He also has a hx of  homelessness.     Review of Systems: Review of Systems  Constitutional: Positive for chills and fever. Negative for weight loss.  Eyes: Negative for blurred vision.  Respiratory: Positive for cough and shortness of breath.   Cardiovascular: Positive for chest pain. Negative for palpitations.  Gastrointestinal: Positive for constipation. Negative for diarrhea, nausea and vomiting.  Genitourinary: Negative for dysuria and hematuria.  Musculoskeletal: Positive for back pain.  Neurological: Positive for dizziness and headaches. Negative for sensory change and focal weakness.    Past Medical History:  Diagnosis Date  . CHF (congestive heart failure) ([place])   . COPD (chronic obstructive pulmonary disease) ([place])   . Panic attacks   . Tobacco abuse     Social History  Substance Use Topics  . Smoking status: Current Every Day Smoker    Packs/day: 0.50    Types: Cigarettes  . Smokeless tobacco: Never Used  . Alcohol use Yes     Comment: Daily. Heavy.     Family History  Problem Relation Age of Onset  . Hypertension Mother   . Cancer Other   . Stroke Other   . Coronary artery disease Other      Medications:  Scheduled: . albuterol  5 mg Nebulization Once  . [START ON 02/18/2017] enoxaparin (LOVENOX) injection  40 mg Subcutaneous Q24H  . fentaNYL      . folic acid  1 mg Oral Daily  . ketorolac  30 mg Intramuscular Once  . lidocaine      . lidocaine      . LORazepam  0-4 mg Intravenous Q6H   Followed by  . [START ON 02/18/2017] LORazepam  0-4 mg Intravenous Q12H  . multivitamin with minerals  1 tablet Oral Daily  . nicotine  21 mg Transdermal Daily  . sodium chloride flush  3 mL Intravenous Q12H  . thiamine  100 mg Oral Daily    Abtx:  Anti-infectives    Start     Dose/Rate Route Frequency Ordered Stop   02/17/17 0000  vancomycin (VANCOCIN) IVPB 1000 mg/200 mL premix  Status:  Discontinued     1,000 mg 200 mL/hr over 60 Minutes Intravenous Every 8 hours 02/16/17 1642  02/17/17 0917   02/16/17 1430  ceFEPIme (MAXIPIME) 2 g in dextrose 5 % 50 mL IVPB  Status:  Discontinued     2 g 100 mL/hr over 30 Minutes Intravenous Every 8 hours 02/16/17 1416 02/17/17 0917   02/16/17 1415  vancomycin (VANCOCIN) 2,000 mg in sodium chloride 0.9 % 500 mL IVPB     2,000 mg 250 mL/hr over 120 Minutes Intravenous  Once 02/16/17 1405 02/16/17 1805   02/16/17 1330  piperacillin-tazobactam (ZOSYN) IVPB 3.375 g  Status:  Discontinued     3.375 g 100 mL/hr over 30 Minutes Intravenous  Once 02/16/17 1332 02/16/17 1413        OBJECTIVE: Blood pressure (!) 210/120, pulse 74, temperature 98.7 F (37.1 C), temperature source Oral, resp. rate (!) 32, SpO2 94 %.  Physical Exam  Constitutional: He is oriented to person, place, and time and well-developed, well-nourished, and in no distress. No distress.  HENT:  Mouth/Throat: No oropharyngeal exudate.  Eyes: Pupils are equal, round, and reactive to light. EOM are normal. No scleral icterus.  Neck: Neck supple.  Cardiovascular: Normal rate, regular rhythm and normal heart sounds.   No murmur heard. Pulmonary/Chest: Effort normal and breath sounds normal.  Abdominal: Soft. Bowel sounds are normal. He exhibits no distension. There is no tenderness.  Musculoskeletal: Normal range of motion. He exhibits no edema.  Lymphadenopathy:    He has no cervical adenopathy.  Neurological: He is alert and oriented to person, place, and time. He exhibits normal muscle tone.  Skin: He is not diaphoretic.  Psychiatric: Mood normal.  refuses spine exam. Strength in LE 5/5  Lab Results Results for orders placed or performed during the hospital encounter of 02/16/17 (from the past 48 hour(s))  Brain natriuretic peptide     Status: None   Collection Time: 02/15/17 11:58 PM  Result Value Ref Range   B Natriuretic Peptide 74.5 0.0 - 100.0 pg/mL  CBC with Differential     Status: Abnormal   Collection Time: 02/15/17 11:58 PM  Result Value Ref  Range   WBC 7.9 4.0 - 10.5 K/uL    Comment: WHITE COUNT CONFIRMED ON SMEAR   RBC 4.00 (L) 4.22 - 5.81 MIL/uL   Hemoglobin 14.5 13.0 - 17.0 g/dL   HCT 41.3 39.0 - 52.0 %   MCV 103.3 (H) 78.0 - 100.0 fL   MCH 36.3 (H) 26.0 - 34.0 pg   MCHC 35.1 30.0 - 36.0 g/dL   RDW 12.8 11.5 - 15.5 %   Platelets 282 150 - 400 K/uL    Comment: PLATELET COUNT CONFIRMED BY SMEAR  Neutrophils Relative % 69 %   Neutro Abs 5.5 1.7 - 7.7 K/uL   Lymphocytes Relative 24 %   Lymphs Abs 1.9 0.7 - 4.0 K/uL   Monocytes Relative 7 %   Monocytes Absolute 0.5 0.1 - 1.0 K/uL   Eosinophils Relative 1 %   Eosinophils Absolute 0.1 0.0 - 0.7 K/uL   Basophils Relative 0 %   Basophils Absolute 0.0 0.0 - 0.1 K/uL  Comprehensive metabolic panel     Status: Abnormal   Collection Time: 02/15/17 11:58 PM  Result Value Ref Range   Sodium 133 (L) 135 - 145 mmol/L   Potassium 3.1 (L) 3.5 - 5.1 mmol/L   Chloride 96 (L) 101 - 111 mmol/L   CO2 29 22 - 32 mmol/L   Glucose, Bld 135 (H) 65 - 99 mg/dL   BUN <5 (L) 6 - 20 mg/dL   Creatinine, Ser 0.86 0.61 - 1.24 mg/dL   Calcium 8.8 (L) 8.9 - 10.3 mg/dL   Total Protein 8.2 (H) 6.5 - 8.1 g/dL   Albumin 3.2 (L) 3.5 - 5.0 g/dL   AST 34 15 - 41 U/L   ALT 53 17 - 63 U/L   Alkaline Phosphatase 53 38 - 126 U/L   Total Bilirubin 0.8 0.3 - 1.2 mg/dL   GFR calc non Af Amer >60 >60 mL/min   GFR calc Af Amer >60 >60 mL/min    Comment: (NOTE) The eGFR has been calculated using the CKD EPI equation. This calculation has not been validated in all clinical situations. eGFR's persistently <60 mL/min signify possible Chronic Kidney Disease.    Anion gap 8 5 - 15  I-Stat Troponin, ED (not at [facility])     Status: None   Collection Time: 02/16/17 12:05 AM  Result Value Ref Range   Troponin i, poc 0.02 0.00 - 0.08 ng/mL   Comment 3            Comment: Due to the release kinetics of cTnI, a negative result within the first hours of the onset of symptoms does not rule out myocardial infarction  with certainty. If myocardial infarction is still suspected, repeat the test at appropriate intervals.   Sedimentation rate     Status: Abnormal   Collection Time: 02/16/17  1:26 PM  Result Value Ref Range   Sed Rate 63 (H) 0 - 16 mm/hr  C-reactive protein     Status: Abnormal   Collection Time: 02/16/17  1:26 PM  Result Value Ref Range   CRP 1.4 (H) <1.0 mg/dL  CBC     Status: Abnormal   Collection Time: 02/16/17  2:55 PM  Result Value Ref Range   WBC 5.9 4.0 - 10.5 K/uL   RBC 3.90 (L) 4.22 - 5.81 MIL/uL   Hemoglobin 13.6 13.0 - 17.0 g/dL   HCT 40.7 39.0 - 52.0 %   MCV 104.4 (H) 78.0 - 100.0 fL   MCH 34.9 (H) 26.0 - 34.0 pg   MCHC 33.4 30.0 - 36.0 g/dL   RDW 13.3 11.5 - 15.5 %   Platelets 273 150 - 400 K/uL  Comprehensive metabolic panel     Status: Abnormal   Collection Time: 02/16/17  2:55 PM  Result Value Ref Range   Sodium 137 135 - 145 mmol/L   Potassium 3.7 3.5 - 5.1 mmol/L   Chloride 104 101 - 111 mmol/L   CO2 29 22 - 32 mmol/L   Glucose, Bld 112 (H) 65 - 99 mg/dL   BUN  8 6 - 20 mg/dL   Creatinine, Ser 0.84 0.61 - 1.24 mg/dL   Calcium 8.7 (L) 8.9 - 10.3 mg/dL   Total Protein 7.4 6.5 - 8.1 g/dL   Albumin 2.9 (L) 3.5 - 5.0 g/dL   AST 33 15 - 41 U/L   ALT 47 17 - 63 U/L   Alkaline Phosphatase 50 38 - 126 U/L   Total Bilirubin 0.7 0.3 - 1.2 mg/dL   GFR calc non Af Amer >60 >60 mL/min   GFR calc Af Amer >60 >60 mL/min    Comment: (NOTE) The eGFR has been calculated using the CKD EPI equation. This calculation has not been validated in all clinical situations. eGFR's persistently <60 mL/min signify possible Chronic Kidney Disease.    Anion gap 4 (L) 5 - 15  HIV antibody     Status: None   Collection Time: 02/16/17  2:55 PM  Result Value Ref Range   HIV Screen 4th Generation wRfx Non Reactive Non Reactive    Comment: (NOTE) Performed At: [facility] [address] [place], [place] [id number] Lindon Romp MD [id number]   Hepatitis panel,  acute     Status: Abnormal   Collection Time: 02/16/17  2:55 PM  Result Value Ref Range   Hepatitis B Surface Ag Negative Negative   HCV Ab >11.0 (H) 0.0 - 0.9 s/co ratio    Comment: (NOTE)                                  Negative:     < 0.8                             Indeterminate: 0.8 - 0.9                                  Positive:     > 0.9 The CDC recommends that a positive HCV antibody result be followed up with a HCV Nucleic Acid Amplification test (176160). Performed At: [facility] [place], [place] [id number] Lindon Romp MD [id number]    Hep A IgM Negative Negative   Hep B C IgM Negative Negative  Lactic acid, plasma     Status: None   Collection Time: 02/16/17  4:19 PM  Result Value Ref Range   Lactic Acid, Venous 1.4 0.5 - 1.9 mmol/L  Lactic acid, plasma     Status: None   Collection Time: 02/16/17  5:59 PM  Result Value Ref Range   Lactic Acid, Venous 1.3 0.5 - 1.9 mmol/L  Urine rapid drug screen (hosp performed)     Status: Abnormal   Collection Time: 02/16/17 10:30 PM  Result Value Ref Range   Opiates POSITIVE (A) NONE DETECTED   Cocaine POSITIVE (A) NONE DETECTED   Benzodiazepines NONE DETECTED NONE DETECTED   Amphetamines NONE DETECTED NONE DETECTED   Tetrahydrocannabinol NONE DETECTED NONE DETECTED   Barbiturates NONE DETECTED NONE DETECTED    Comment:        DRUG SCREEN FOR MEDICAL PURPOSES ONLY.  IF CONFIRMATION IS NEEDED FOR ANY PURPOSE, NOTIFY LAB WITHIN 5 DAYS.        LOWEST DETECTABLE LIMITS FOR URINE DRUG SCREEN Drug Class       Cutoff (ng/mL) Amphetamine      1000 Barbiturate  200 Benzodiazepine   053 Tricyclics       976 Opiates          300 Cocaine          300 THC              50   Basic metabolic panel     Status: Abnormal   Collection Time: 02/17/17  4:52 AM  Result Value Ref Range   Sodium 133 (L) 135 - 145 mmol/L   Potassium 3.6 3.5 - 5.1 mmol/L   Chloride 99 (L) 101 - 111 mmol/L   CO2 28  22 - 32 mmol/L   Glucose, Bld 155 (H) 65 - 99 mg/dL   BUN 7 6 - 20 mg/dL   Creatinine, Ser 0.86 0.61 - 1.24 mg/dL   Calcium 8.5 (L) 8.9 - 10.3 mg/dL   GFR calc non Af Amer >60 >60 mL/min   GFR calc Af Amer >60 >60 mL/min    Comment: (NOTE) The eGFR has been calculated using the CKD EPI equation. This calculation has not been validated in all clinical situations. eGFR's persistently <60 mL/min signify possible Chronic Kidney Disease.    Anion gap 6 5 - 15  CBC     Status: Abnormal   Collection Time: 02/17/17  4:52 AM  Result Value Ref Range   WBC 8.0 4.0 - 10.5 K/uL   RBC 3.94 (L) 4.22 - 5.81 MIL/uL   Hemoglobin 14.1 13.0 - 17.0 g/dL   HCT 41.1 39.0 - 52.0 %   MCV 104.3 (H) 78.0 - 100.0 fL   MCH 35.8 (H) 26.0 - 34.0 pg   MCHC 34.3 30.0 - 36.0 g/dL   RDW 12.9 11.5 - 15.5 %   Platelets 285 150 - 400 K/uL  Sedimentation rate     Status: Abnormal   Collection Time: 02/17/17  4:52 AM  Result Value Ref Range   Sed Rate 35 (H) 0 - 16 mm/hr  C-reactive protein     Status: Abnormal   Collection Time: 02/17/17  4:52 AM  Result Value Ref Range   CRP 1.5 (H) <1.0 mg/dL  Protime-INR     Status: None   Collection Time: 02/17/17 10:07 AM  Result Value Ref Range   Prothrombin Time 13.8 11.4 - 15.2 seconds   INR 1.07   Aerobic Culture (superficial specimen)     Status: None (Preliminary result)   Collection Time: 02/17/17 12:34 PM  Result Value Ref Range   Specimen Description FLUID    Special Requests DISC T11 12    Gram Stain      ABUNDANT WBC PRESENT, PREDOMINANTLY PMN RARE BUDDING YEAST SEEN    Culture PENDING    Report Status PENDING       Component Value Date/Time   SDES FLUID 02/17/2017 1234   SPECREQUEST DISC T11 12 02/17/2017 1234   CULT PENDING 02/17/2017 1234   REPTSTATUS PENDING 02/17/2017 1234   Dg Chest 2 View  Result Date: 02/16/2017 CLINICAL DATA:  50 y/o M; shortness of breath with productive cough. EXAM: CHEST  2 VIEW COMPARISON:  02/06/2017 chest  radiograph FINDINGS: The heart size and mediastinal contours are within normal limits. Bronchitic changes in the left infrahilar region. No focal consolidation. The visualized skeletal structures are unremarkable. IMPRESSION: Bronchitic changes in left infrahilar region. No focal consolidation. Electronically Signed   By: Kristine Garbe M.D.   On: 02/16/2017 00:18   Mr Lumbar Spine W Wo Contrast  Result Date: 02/16/2017 CLINICAL DATA:  Increasing back pain over the last 3 months. IV drug abuse. Cauda equina syndrome suspected. EXAM: MRI LUMBAR SPINE WITHOUT AND WITH CONTRAST TECHNIQUE: Multiplanar and multiecho pulse sequences of the lumbar spine were obtained without and with intravenous contrast. CONTRAST:  <20 ml > MULTIHANCE GADOBENATE DIMEGLUMINE 529 MG/ML IV SOLN COMPARISON:  Acute abdominal series done 02/06/2017. abdominopelvic CT 02/06/2017. FINDINGS: Segmentation: Conventional anatomy assumed, with the last open disc space designated L5-S1. This correlates with prior imaging. Alignment: Mild straightening. There are bilateral L5 pars defects with 5 mm of anterolisthesis at L5-S1. Vertebrae: The marrow signal is diffusely heterogeneous on T2 and T1 weighted images. As above, there are chronic L5 pars defects. There are changes of diskitis and osteomyelitis at T11-12 with narrowing, T2 hyperintensity and enhancement of the intervertebral disc. There is also bone marrow edema and enhancement within the T11 and T12 vertebral bodies. Of note, the most superior visualization on this lumbar spine study is at the superior aspect of the T11 vertebral body. There are no changes of discitis or osteomyelitis in the lumbar spine. The visualized sacroiliac joints appear unremarkable. Conus medullaris: Extends to the T12 level and appears normal. No abnormal intradural enhancement or distal cord compression identified. Paraspinal and other soft tissues: There is paraspinal inflammation and enhancement at  T11-12. No epidural fluid collection is identified. Disc levels: T11-12: As above, changes of diskitis and osteomyelitis with paraspinal inflammation. There is associated disc bulging with mild mass effect on the thecal sac. No foraminal compromise or nerve root encroachment seen. T12-L1: Disc and endplate degeneration with anterior osteophytes and mild disc bulging. This level was not imaged in the axial plane. No evidence of nerve root encroachment. No significant acquired findings from L1-2 through L3-4. L4-5: Disc height and hydration are maintained. Minimal disc bulging and mild facet hypertrophy. No spinal stenosis or nerve root encroachment. L5-S1: The disc is degenerated with annular bulging and uncovering related to the pars defects and resulting anterolisthesis. The spinal canal and lateral recesses are patent. There is moderate foraminal narrowing, right-greater-than-left, with possible bilateral L5 nerve root encroachment. IMPRESSION: 1. Findings are consistent with discitis and osteomyelitis at the T11-12 level. The superior extent of this may be incompletely visualized by this examination. 2. No conus compression or abnormal intradural enhancement identified. 3. 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 Electronically Signed   By: Richardean Sale M.D.   On: 02/16/2017 11:31   Ir Fluoro Guide Ndl Plmt / Bx  Result Date: 02/17/2017 INDICATION: Concern for discitis/osteomyelitis involving T11-T12. Please perform fluoroscopic guided disc aspiration for diagnostic purposes. EXAM: IR FLUORO GUIDE NEEDLE PLACEMENT /BIOPSY COMPARISON:  Lumbar spine MRI - 02/16/2017; CT abdomen and pelvis - 02/06/2017 MEDICATIONS: None ANESTHESIA/SEDATION: Moderate (conscious) sedation was employed during this procedure. A total of Fentanyl 100 mcg was administered intravenously. Moderate Sedation Time: 10  minutes. The patient's level of consciousness and vital signs were monitored continuously by radiology nursing throughout the procedure under my direct supervision. CONTRAST:  None FLUOROSCOPY TIME:  2 minutes, 6 seconds (366.4 mGy) COMPLICATIONS: None immediate. PROCEDURE: Informed written consent was obtained from the patient after a discussion of the risks, benefits and alternatives to treatment. A timeout was performed prior to the initiation of the procedure. The patient was positioned prone on the fluoroscopy table. The T11-T12 intervertebral disc space was marked fluoroscopically. Utilizing an oblique, left-sided approach, a 20 gauge Francine needle was a advanced into the T11-T12  intervertebral disc. Appropriate positioning was confirmed with the fluoroscopic imaging in both the anterior and lateral projections. Multiple spot fluoroscopic images were saved for procedural documentation purposes. With the needle appropriate position, approximately 2 cc of slightly purulent appearing fluid was aspirated from the disc space. All fluid was capped and sent to the laboratory for analysis. The needle was removed and superficial hemostasis was achieved with manual compression. A dressing was placed. The patient tolerated the procedure well without immediate postprocedural complication. IMPRESSION: Technically successful fluoroscopic guided T11-T12 disc aspiration. Electronically Signed   By: Sandi Mariscal M.D.   On: 02/17/2017 12:55   Recent Results (from the past 240 hour(s))  Aerobic Culture (superficial specimen)     Status: None (Preliminary result)   Collection Time: 02/17/17 12:34 PM  Result Value Ref Range Status   Specimen Description FLUID  Final   Special Requests DISC T11 12  Final   Gram Stain   Final    ABUNDANT WBC PRESENT, PREDOMINANTLY PMN RARE BUDDING YEAST SEEN    Culture PENDING  Incomplete   Report Status PENDING  Incomplete    Microbiology: Recent Results (from the past 240  hour(s))  Aerobic Culture (superficial specimen)     Status: None (Preliminary result)   Collection Time: 02/17/17 12:34 PM  Result Value Ref Range Status   Specimen Description FLUID  Final   Special Requests DISC T11 12  Final   Gram Stain   Final    ABUNDANT WBC PRESENT, PREDOMINANTLY PMN RARE BUDDING YEAST SEEN    Culture PENDING  Incomplete   Report Status PENDING  Incomplete    Bobby Rumpf, MD [facility] [phone] 02/17/2017, 5:18 PM

## 2017-02-17 NOTE — Progress Notes (Signed)
Family Medicine Teaching Service Daily Progress Note Intern Pager: [phone]  Patient name: [name] Medical record number: [id number] Date of birth: [birth year] Age: 51 y.o. Gender: male  Primary Care Provider: Patient, No Pcp Per Consultants: ID Code Status: FULL  Pt Overview and Major Events to Date:  Admitted 9/29 for   Assessment and Plan: [name] is a 50 y.o. male presenting with back pain . PMH is significant for substance abuse (cocaine, heroin). Also with reported prior diagnosis of COPD, CHF, and pulm HTN.   Chronic Back Pain: Likely 2/2 osteomyelitis and discitis as shown on MRI lumbar spine. Could also be due to spinal/epidural abscess. Unlikely to be due to herniated disc vs muscular strain given imaging and no preceding trauma. Patient states for the past 3 weeks he has had increased back pain. He does endorse IV heroin use. No leukocytosis. LA < 2.0 x 2. ESR 35, CRP 1.5. No murmur on exam to suggest seeding.  - Vitals per floor - BCx x 2 - Trend CBC, BMP - Thoracic MRI to rule out abscess in the thoracic spine. As patient recently received contrast for L spine MRI, will have to wait 12 hours before additional MRI can be performed (must be performed without contrast, otherwise wait 48 hrs; ~ 1200 02/18/17). Plan to wait until 10/1 to perform with contrast, as looking for abscess.  - dilaudid 1 mg IV q4h prn for severe pain; consider decreasing if remains sleepy - s/p 3 doses of cefepime and 2 doses of vancomycin - ID consulted; appreciate recommendations; plan to hold IV antibiotics until disc aspiration culture results - IR to sample fluid of disc;  - Ordered fluid culture, aerobic, anaerobic, mycobacterial, and fungal cultures; spoke with lab who said histopathology not available today  Hx of Polysubstance Abuse: Patient endorsed using cocaine to myself and history of marijuana. Endorsed IV drug use to ED provider. UDS positive for cocaine and  opiates. HIV screen NR. - Hepatitis panel; HCV Ab >11.0   Hx of Tobacco Use Disorder. Patient has long history of tobacco use. Currently smokes  - Provided counseling on quitting and health benefits of stopping smoking. - '14mg'$  Nicotine patch  Hx of Alcohol Use Disorder: Patient has history of heavy alcohol intake. Elevated CIWA score of 15 overnight. Received 2 mg IV ativan with scores improved to 6 and 5. - CIWA protocol  HTN: Suspect 2/2 pain - prn hydralazine 5 mg q4h for SBP>180, DBP>110  CBC with macrocytosis: - Obtain folate and B12 levels  CHF: Patient states he was diagnosed with CHF in prison recently. He states he is not currently taking any medications and was never started on any. He is not having any active signs or symptoms of CHF exacerbation at this time. - Consider echo cardiogram  - Consider starting him on optimal therapy.  FEN/GI: Regular diet Prophylaxis: SCDs  Disposition: inpatient pending plan for antibiotics  Subjective:  Patient reports back continues to ache. He is easy to awaken but falls asleep quickly. No new complaints. Denies any urinary or bowel incontinence. Says he was able to have a BM this a.m.   Objective: Temp:  [97.6 F (36.4 C)-99.7 F (37.6 C)] 99.7 F (37.6 C) (09/30 0415) Pulse Rate:  [57-100] 79 (09/30 0415) Resp:  [12-31] 12 (09/29 2230) BP: (134-219)/(62-124) 185/110 (09/30 0415) SpO2:  [94 %-100 %] 94 % (09/30 0415) Physical Exam: General: Obese male, resting in bed Cardiovascular: RRR, S1S2, no mrg Respiratory: CTAB, transmitted upper  airway noises Abdomen: Distended abdomen, +BS, NT Neuro: Falling asleep during conversation but easily arousable. AOx3. Strength 5/5 of upper and LEs.  Extremities: Moves all spontaneously, no LE edema. Slight swelling and erythema over R forearm IV site.   Laboratory:  Recent Labs Lab 02/15/17 2358 02/16/17 1455 02/17/17 0452  WBC 7.9 5.9 8.0  HGB 14.5 13.6 14.1  HCT 41.3 40.7  41.1  PLT 282 273 285    Recent Labs Lab 02/15/17 2358 02/16/17 1455 02/17/17 0452  NA 133* 137 133*  K 3.1* 3.7 3.6  CL 96* 104 99*  CO2 '29 29 28  '$ BUN <5* 8 7  CREATININE 0.86 0.84 0.86  CALCIUM 8.8* 8.7* 8.5*  PROT 8.2* 7.4  --   BILITOT 0.8 0.7  --   ALKPHOS 53 50  --   ALT 53 47  --   AST 34 33  --   GLUCOSE 135* 112* 155*    Imaging/Diagnostic Tests: Dg Chest 2 View  Result Date: 02/16/2017 CLINICAL DATA:  51 y/o M; shortness of breath with productive cough. EXAM: CHEST  2 VIEW COMPARISON:  02/06/2017 chest radiograph FINDINGS: The heart size and mediastinal contours are within normal limits. Bronchitic changes in the left infrahilar region. No focal consolidation. The visualized skeletal structures are unremarkable. IMPRESSION: Bronchitic changes in left infrahilar region. No focal consolidation. Electronically Signed   By: Kristine Garbe M.D.   On: 02/16/2017 00:18   Mr Lumbar Spine W Wo Contrast  Result Date: 02/16/2017 CLINICAL DATA:  Increasing back pain over the last 3 months. IV drug abuse. Cauda equina syndrome suspected. EXAM: MRI LUMBAR SPINE WITHOUT AND WITH CONTRAST TECHNIQUE: Multiplanar and multiecho pulse sequences of the lumbar spine were obtained without and with intravenous contrast. CONTRAST:  <20 ml > MULTIHANCE GADOBENATE DIMEGLUMINE 529 MG/ML IV SOLN COMPARISON:  Acute abdominal series done 02/06/2017. abdominopelvic CT 02/06/2017. FINDINGS: Segmentation: Conventional anatomy assumed, with the last open disc space designated L5-S1. This correlates with prior imaging. Alignment: Mild straightening. There are bilateral L5 pars defects with 5 mm of anterolisthesis at L5-S1. Vertebrae: The marrow signal is diffusely heterogeneous on T2 and T1 weighted images. As above, there are chronic L5 pars defects. There are changes of diskitis and osteomyelitis at T11-12 with narrowing, T2 hyperintensity and enhancement of the intervertebral disc. There is also  bone marrow edema and enhancement within the T11 and T12 vertebral bodies. Of note, the most superior visualization on this lumbar spine study is at the superior aspect of the T11 vertebral body. There are no changes of discitis or osteomyelitis in the lumbar spine. The visualized sacroiliac joints appear unremarkable. Conus medullaris: Extends to the T12 level and appears normal. No abnormal intradural enhancement or distal cord compression identified. Paraspinal and other soft tissues: There is paraspinal inflammation and enhancement at T11-12. No epidural fluid collection is identified. Disc levels: T11-12: As above, changes of diskitis and osteomyelitis with paraspinal inflammation. There is associated disc bulging with mild mass effect on the thecal sac. No foraminal compromise or nerve root encroachment seen. T12-L1: Disc and endplate degeneration with anterior osteophytes and mild disc bulging. This level was not imaged in the axial plane. No evidence of nerve root encroachment. No significant acquired findings from L1-2 through L3-4. L4-5: Disc height and hydration are maintained. Minimal disc bulging and mild facet hypertrophy. No spinal stenosis or nerve root encroachment. L5-S1: The disc is degenerated with annular bulging and uncovering related to the pars defects and resulting anterolisthesis. The spinal  canal and lateral recesses are patent. There is moderate foraminal narrowing, right-greater-than-left, with possible bilateral L5 nerve root encroachment. IMPRESSION: 1. Findings are consistent with discitis and osteomyelitis at the T11-12 level. The superior extent of this may be incompletely visualized by this examination. 2. No conus compression or abnormal intradural enhancement identified. 3. 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 Electronically Signed   By: Richardean Sale  M.D.   On: 02/16/2017 11:31   Ct Abdomen Pelvis W Contrast  Result Date: 02/06/2017 CLINICAL DATA:  Right-sided flank pain with radiation to the abdomen. COPD. EXAM: CT ABDOMEN AND PELVIS WITH CONTRAST TECHNIQUE: Multidetector CT imaging of the abdomen and pelvis was performed using the standard protocol following bolus administration of intravenous contrast. CONTRAST:  17m ISOVUE-300 IOPAMIDOL (ISOVUE-300) INJECTION 61% COMPARISON:  10/16/2010 FINDINGS: Lower chest: Clear lung bases. Borderline cardiomegaly, without pericardial or pleural effusion. Hepatobiliary: Mild to moderate hepatic steatosis. No focal liver lesion. Normal gallbladder, without biliary ductal dilatation. Pancreas: Normal, without mass or ductal dilatation. Spleen: Normal in size, without focal abnormality. Adrenals/Urinary Tract: Normal adrenal glands. An upper pole left renal cyst of 3.4 cm. Interpolar right renal too small to characterize lesion. No hydronephrosis. Normal urinary bladder. Stomach/Bowel: Normal stomach, without wall thickening. Colonic stool burden suggests constipation. Normal terminal ileum and appendix. Normal small bowel. Vascular/Lymphatic: Aortic and branch vessel atherosclerosis. No abdominopelvic adenopathy. Reproductive: Normal prostate. Other: No significant free fluid. Fat containing periumbilical hernia is small on image 59/series 2. No bowel involvement or other acute complication. Musculoskeletal: Bilateral L5 pars defects. Grade 1 L5-S1 anterolisthesis with advanced degenerative disc disease at this level. IMPRESSION: 1.  No acute process in the abdomen or pelvis. 2. Hepatic steatosis. 3. Fat containing ventral abdominal wall hernia. 4.  Aortic Atherosclerosis (ICD10-I70.0). 5. L5 pars defects with malalignment, increased since 10/16/2010. Electronically Signed   By: KAbigail MiyamotoM.D.   On: 02/06/2017 17:32   Dg Abdomen Acute W/chest  Result Date: 02/06/2017 CLINICAL DATA:  Right-sided flank pain.   Unable to void EXAM: DG ABDOMEN ACUTE W/ 1V CHEST COMPARISON:  Chest radiograph Oct 12, 2015 ; CT abdomen and pelvis Oct 16, 2010 FINDINGS: PA chest: There is no edema or consolidation. Heart is upper normal in size with pulmonary vascularity within normal limits. No adenopathy. There is old trauma involving the lateral right clavicle. Supine and upright abdomen: There is diffuse stool throughout the colon. There is no bowel dilatation or air-fluid level to suggest bowel obstruction. No free air. Contrast is seen in each renal collecting system, ureter and bladder without focal lesion in these structures apparent on this study. IMPRESSION: Diffuse stool throughout colon. No bowel obstruction or free air. No lung edema or consolidation. Electronically Signed   By: WLowella GripIII M.D.   On: 02/06/2017 17:36   FRogue Bussing MD 02/17/2017, 8:46 AM PGY-3, CRamonaIntern pager: [phone] text pages welcome

## 2017-02-17 NOTE — Consult Note (Signed)
Chief Complaint: T11-12 diskitis  Referring Physician:Dr. Erin Hearing  Supervising Physician: Sandi Mariscal  Patient Status: [facility] - In-pt  HPI: [name] is a 51 y.o. male who was admitted with 3 weeks of back pain.  He is a current cocaine abuser, snorting and injecting.  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  IR has been asked to evaluate the patient for a disc aspiration for culture.  Past Medical History:  Past Medical History:  Diagnosis Date  . CHF (congestive heart failure) ([place])   . COPD (chronic obstructive pulmonary disease) ([place])   . Panic attacks   . Tobacco abuse     Past Surgical History:  Past Surgical History:  Procedure Laterality Date  . CARDIAC CATHETERIZATION N/A 10/14/2015   Procedure: Left Heart Cath and Coronary Angiography;  Surgeon: Sherren Mocha, MD;  Location: [facility];  Service: Cardiovascular;  Laterality: N/A;  . FOOT SURGERY Right   . HAND SURGERY    . ORIF METACARPAL FRACTURE Left 2011  . ROTATOR CUFF REPAIR Right   . SHOULDER SURGERY      Family History:  Family History  Problem Relation Age of Onset  . Hypertension Mother   . Cancer Other   . Stroke Other   . Coronary artery disease Other     Social History:  reports that he has been smoking Cigarettes.  He has been smoking about 0.50 packs per day. He has never used smokeless tobacco. He reports that he drinks alcohol. He reports that he uses drugs, including Cocaine.  Allergies:  Allergies  Allergen Reactions  . Penicillins Other (See Comments)    Has patient had a PCN reaction causing immediate rash, facial/tongue/throat swelling, SOB or lightheadedness with hypotension: Unknow Has patient had a PCN reaction causing severe rash involving mucus membranes or skin necrosis: Unknown Has patient had a PCN reaction that required hospitalization unknown Has patient had a PCN reaction occurring within  the last 10 years: No If all of the above answers are "NO", then may proceed with Cephalosporin use.   [name] Penicillins Other (See Comments)    Medications: Medications reviewed in epic  Please HPI for pertinent positives, otherwise complete 10 system ROS negative.  Physical Exam: BP (!) 185/110 (BP Location: Left Arm)   Pulse 79   Temp 99.7 F (37.6 C) (Oral)   Resp 12   SpO2 94%  There is no height or weight on file to calculate BMI. General: disheveled appearing white male who is laying in bed in NAD and sleepy secondary to ativan from [place] HEENT: head is normocephalic, atraumatic.  Sclera are noninjected.  PERRL.  Ears and nose without any masses or lesions.  Mouth is pink  Heart: regular, rate, and rhythm.  Normal s1,s2. No obvious murmurs, gallops, or rubs noted.   Lungs: CTAB, no wheezes, rhonchi, or rales noted.  Respiratory effort nonlabored Psych: sedated, but arouses secondary to ativan from [place]: Results for orders placed or performed during the hospital encounter of 02/16/17 (from the past 48 hour(s))  Brain natriuretic peptide     Status: None   Collection Time: 02/15/17 11:58 PM  Result Value Ref Range   B Natriuretic Peptide 74.5 0.0 - 100.0 pg/mL  CBC with Differential     Status: Abnormal   Collection Time: 02/15/17 11:58 PM  Result Value Ref Range   WBC 7.9 4.0 - 10.5 K/uL  Comment: WHITE COUNT CONFIRMED ON SMEAR   RBC 4.00 (L) 4.22 - 5.81 MIL/uL   Hemoglobin 14.5 13.0 - 17.0 g/dL   HCT 41.3 39.0 - 52.0 %   MCV 103.3 (H) 78.0 - 100.0 fL   MCH 36.3 (H) 26.0 - 34.0 pg   MCHC 35.1 30.0 - 36.0 g/dL   RDW 12.8 11.5 - 15.5 %   Platelets 282 150 - 400 K/uL    Comment: PLATELET COUNT CONFIRMED BY SMEAR   Neutrophils Relative % 69 %   Neutro Abs 5.5 1.7 - 7.7 K/uL   Lymphocytes Relative 24 %   Lymphs Abs 1.9 0.7 - 4.0 K/uL   Monocytes Relative 7 %   Monocytes Absolute 0.5 0.1 - 1.0 K/uL   Eosinophils Relative 1 %   Eosinophils Absolute 0.1 0.0 - 0.7  K/uL   Basophils Relative 0 %   Basophils Absolute 0.0 0.0 - 0.1 K/uL  Comprehensive metabolic panel     Status: Abnormal   Collection Time: 02/15/17 11:58 PM  Result Value Ref Range   Sodium 133 (L) 135 - 145 mmol/L   Potassium 3.1 (L) 3.5 - 5.1 mmol/L   Chloride 96 (L) 101 - 111 mmol/L   CO2 29 22 - 32 mmol/L   Glucose, Bld 135 (H) 65 - 99 mg/dL   BUN <5 (L) 6 - 20 mg/dL   Creatinine, Ser 0.86 0.61 - 1.24 mg/dL   Calcium 8.8 (L) 8.9 - 10.3 mg/dL   Total Protein 8.2 (H) 6.5 - 8.1 g/dL   Albumin 3.2 (L) 3.5 - 5.0 g/dL   AST 34 15 - 41 U/L   ALT 53 17 - 63 U/L   Alkaline Phosphatase 53 38 - 126 U/L   Total Bilirubin 0.8 0.3 - 1.2 mg/dL   GFR calc non Af Amer >60 >60 mL/min   GFR calc Af Amer >60 >60 mL/min    Comment: (NOTE) The eGFR has been calculated using the CKD EPI equation. This calculation has not been validated in all clinical situations. eGFR's persistently <60 mL/min signify possible Chronic Kidney Disease.    Anion gap 8 5 - 15  I-Stat Troponin, ED (not at [facility])     Status: None   Collection Time: 02/16/17 12:05 AM  Result Value Ref Range   Troponin i, poc 0.02 0.00 - 0.08 ng/mL   Comment 3            Comment: Due to the release kinetics of cTnI, a negative result within the first hours of the onset of symptoms does not rule out myocardial infarction with certainty. If myocardial infarction is still suspected, repeat the test at appropriate intervals.   Sedimentation rate     Status: Abnormal   Collection Time: 02/16/17  1:26 PM  Result Value Ref Range   Sed Rate 63 (H) 0 - 16 mm/hr  C-reactive protein     Status: Abnormal   Collection Time: 02/16/17  1:26 PM  Result Value Ref Range   CRP 1.4 (H) <1.0 mg/dL  CBC     Status: Abnormal   Collection Time: 02/16/17  2:55 PM  Result Value Ref Range   WBC 5.9 4.0 - 10.5 K/uL   RBC 3.90 (L) 4.22 - 5.81 MIL/uL   Hemoglobin 13.6 13.0 - 17.0 g/dL   HCT 40.7 39.0 - 52.0 %   MCV 104.4 (H) 78.0 - 100.0 fL   MCH  34.9 (H) 26.0 - 34.0 pg   MCHC 33.4 30.0 - 36.0  g/dL   RDW 13.3 11.5 - 15.5 %   Platelets 273 150 - 400 K/uL  Comprehensive metabolic panel     Status: Abnormal   Collection Time: 02/16/17  2:55 PM  Result Value Ref Range   Sodium 137 135 - 145 mmol/L   Potassium 3.7 3.5 - 5.1 mmol/L   Chloride 104 101 - 111 mmol/L   CO2 29 22 - 32 mmol/L   Glucose, Bld 112 (H) 65 - 99 mg/dL   BUN 8 6 - 20 mg/dL   Creatinine, Ser 0.84 0.61 - 1.24 mg/dL   Calcium 8.7 (L) 8.9 - 10.3 mg/dL   Total Protein 7.4 6.5 - 8.1 g/dL   Albumin 2.9 (L) 3.5 - 5.0 g/dL   AST 33 15 - 41 U/L   ALT 47 17 - 63 U/L   Alkaline Phosphatase 50 38 - 126 U/L   Total Bilirubin 0.7 0.3 - 1.2 mg/dL   GFR calc non Af Amer >60 >60 mL/min   GFR calc Af Amer >60 >60 mL/min    Comment: (NOTE) The eGFR has been calculated using the CKD EPI equation. This calculation has not been validated in all clinical situations. eGFR's persistently <60 mL/min signify possible Chronic Kidney Disease.    Anion gap 4 (L) 5 - 15  HIV antibody     Status: None   Collection Time: 02/16/17  2:55 PM  Result Value Ref Range   HIV Screen 4th Generation wRfx Non Reactive Non Reactive    Comment: (NOTE) Performed At: [facility] [address] [place], [place] [id number] Lindon Romp MD WU:[id number]   Hepatitis panel, acute     Status: Abnormal   Collection Time: 02/16/17  2:55 PM  Result Value Ref Range   Hepatitis B Surface Ag Negative Negative   HCV Ab >11.0 (H) 0.0 - 0.9 s/co ratio    Comment: (NOTE)                                  Negative:     < 0.8                             Indeterminate: 0.8 - 0.9                                  Positive:     > 0.9 The CDC recommends that a positive HCV antibody result be followed up with a HCV Nucleic Acid Amplification test ([id number]). Performed At: [facility], [place] [id number] Lindon Romp MD GE:[id number]    Hep A IgM Negative Negative    Hep B C IgM Negative Negative  Lactic acid, plasma     Status: None   Collection Time: 02/16/17  4:19 PM  Result Value Ref Range   Lactic Acid, Venous 1.4 0.5 - 1.9 mmol/L  Lactic acid, plasma     Status: None   Collection Time: 02/16/17  5:59 PM  Result Value Ref Range   Lactic Acid, Venous 1.3 0.5 - 1.9 mmol/L  Urine rapid drug screen (hosp performed)     Status: Abnormal   Collection Time: 02/16/17 10:30 PM  Result Value Ref Range   Opiates POSITIVE (A) NONE DETECTED   Cocaine POSITIVE (A) NONE DETECTED   Benzodiazepines NONE DETECTED  NONE DETECTED   Amphetamines NONE DETECTED NONE DETECTED   Tetrahydrocannabinol NONE DETECTED NONE DETECTED   Barbiturates NONE DETECTED NONE DETECTED    Comment:        DRUG SCREEN FOR MEDICAL PURPOSES ONLY.  IF CONFIRMATION IS NEEDED FOR ANY PURPOSE, NOTIFY LAB WITHIN 5 DAYS.        LOWEST DETECTABLE LIMITS FOR URINE DRUG SCREEN Drug Class       Cutoff (ng/mL) Amphetamine      1000 Barbiturate      200 Benzodiazepine   937 Tricyclics       169 Opiates          300 Cocaine          300 THC              50   Basic metabolic panel     Status: Abnormal   Collection Time: 02/17/17  4:52 AM  Result Value Ref Range   Sodium 133 (L) 135 - 145 mmol/L   Potassium 3.6 3.5 - 5.1 mmol/L   Chloride 99 (L) 101 - 111 mmol/L   CO2 28 22 - 32 mmol/L   Glucose, Bld 155 (H) 65 - 99 mg/dL   BUN 7 6 - 20 mg/dL   Creatinine, Ser 0.86 0.61 - 1.24 mg/dL   Calcium 8.5 (L) 8.9 - 10.3 mg/dL   GFR calc non Af Amer >60 >60 mL/min   GFR calc Af Amer >60 >60 mL/min    Comment: (NOTE) The eGFR has been calculated using the CKD EPI equation. This calculation has not been validated in all clinical situations. eGFR's persistently <60 mL/min signify possible Chronic Kidney Disease.    Anion gap 6 5 - 15  CBC     Status: Abnormal   Collection Time: 02/17/17  4:52 AM  Result Value Ref Range   WBC 8.0 4.0 - 10.5 K/uL   RBC 3.94 (L) 4.22 - 5.81 MIL/uL    Hemoglobin 14.1 13.0 - 17.0 g/dL   HCT 41.1 39.0 - 52.0 %   MCV 104.3 (H) 78.0 - 100.0 fL   MCH 35.8 (H) 26.0 - 34.0 pg   MCHC 34.3 30.0 - 36.0 g/dL   RDW 12.9 11.5 - 15.5 %   Platelets 285 150 - 400 K/uL  Sedimentation rate     Status: Abnormal   Collection Time: 02/17/17  4:52 AM  Result Value Ref Range   Sed Rate 35 (H) 0 - 16 mm/hr  C-reactive protein     Status: Abnormal   Collection Time: 02/17/17  4:52 AM  Result Value Ref Range   CRP 1.5 (H) <1.0 mg/dL    Imaging: Dg Chest 2 View  Result Date: 02/16/2017 CLINICAL DATA:  51 y/o M; shortness of breath with productive cough. EXAM: CHEST  2 VIEW COMPARISON:  02/06/2017 chest radiograph FINDINGS: The heart size and mediastinal contours are within normal limits. Bronchitic changes in the left infrahilar region. No focal consolidation. The visualized skeletal structures are unremarkable. IMPRESSION: Bronchitic changes in left infrahilar region. No focal consolidation. Electronically Signed   By: Kristine Garbe M.D.   On: 02/16/2017 00:18   Mr Lumbar Spine W Wo Contrast  Result Date: 02/16/2017 CLINICAL DATA:  Increasing back pain over the last 3 months. IV drug abuse. Cauda equina syndrome suspected. EXAM: MRI LUMBAR SPINE WITHOUT AND WITH CONTRAST TECHNIQUE: Multiplanar and multiecho pulse sequences of the lumbar spine were obtained without and with intravenous contrast. CONTRAST:  <20 ml > MULTIHANCE  GADOBENATE DIMEGLUMINE 529 MG/ML IV SOLN COMPARISON:  Acute abdominal series done 02/06/2017. abdominopelvic CT 02/06/2017. FINDINGS: Segmentation: Conventional anatomy assumed, with the last open disc space designated L5-S1. This correlates with prior imaging. Alignment: Mild straightening. There are bilateral L5 pars defects with 5 mm of anterolisthesis at L5-S1. Vertebrae: The marrow signal is diffusely heterogeneous on T2 and T1 weighted images. As above, there are chronic L5 pars defects. There are changes of diskitis and  osteomyelitis at T11-12 with narrowing, T2 hyperintensity and enhancement of the intervertebral disc. There is also bone marrow edema and enhancement within the T11 and T12 vertebral bodies. Of note, the most superior visualization on this lumbar spine study is at the superior aspect of the T11 vertebral body. There are no changes of discitis or osteomyelitis in the lumbar spine. The visualized sacroiliac joints appear unremarkable. Conus medullaris: Extends to the T12 level and appears normal. No abnormal intradural enhancement or distal cord compression identified. Paraspinal and other soft tissues: There is paraspinal inflammation and enhancement at T11-12. No epidural fluid collection is identified. Disc levels: T11-12: As above, changes of diskitis and osteomyelitis with paraspinal inflammation. There is associated disc bulging with mild mass effect on the thecal sac. No foraminal compromise or nerve root encroachment seen. T12-L1: Disc and endplate degeneration with anterior osteophytes and mild disc bulging. This level was not imaged in the axial plane. No evidence of nerve root encroachment. No significant acquired findings from L1-2 through L3-4. L4-5: Disc height and hydration are maintained. Minimal disc bulging and mild facet hypertrophy. No spinal stenosis or nerve root encroachment. L5-S1: The disc is degenerated with annular bulging and uncovering related to the pars defects and resulting anterolisthesis. The spinal canal and lateral recesses are patent. There is moderate foraminal narrowing, right-greater-than-left, with possible bilateral L5 nerve root encroachment. IMPRESSION: 1. Findings are consistent with discitis and osteomyelitis at the T11-12 level. The superior extent of this may be incompletely visualized by this examination. 2. No conus compression or abnormal intradural enhancement identified. 3. 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 Electronically Signed   By: Richardean Sale M.D.   On: 02/16/2017 11:31    Assessment/Plan 1. T11-12 diskitis  We will plan to proceed with a disc aspiration today with no sedation and just some fentanyl as he has eaten.  The patient is agreeable to this.  His coags are currently pending.  Risks and benefits discussed with the patient including bleeding, infection, damage to adjacent structures. All of the patient's questions were answered, patient is agreeable to proceed. Consent signed and in chart.   Thank you for this interesting consult.  I greatly enjoyed meeting [employer] and look forward to participating in their care.  A copy of this report was sent to the requesting provider on this date.  Electronically Signed: Henreitta Cea 02/17/2017, 10:36 AM   I spent a total of 40 Minutes  in face to face in clinical consultation, greater than 50% of which was counseling/coordinating care for T11-12 diskitis

## 2017-02-18 ENCOUNTER — Inpatient Hospital Stay (HOSPITAL_COMMUNITY): Payer: Self-pay

## 2017-02-18 DIAGNOSIS — F1990 Other psychoactive substance use, unspecified, uncomplicated: Secondary | ICD-10-CM

## 2017-02-18 DIAGNOSIS — M869 Osteomyelitis, unspecified: Secondary | ICD-10-CM

## 2017-02-18 DIAGNOSIS — G8929 Other chronic pain: Secondary | ICD-10-CM

## 2017-02-18 DIAGNOSIS — B3789 Other sites of candidiasis: Secondary | ICD-10-CM

## 2017-02-18 DIAGNOSIS — M4640 Discitis, unspecified, site unspecified: Secondary | ICD-10-CM

## 2017-02-18 DIAGNOSIS — M545 Low back pain: Secondary | ICD-10-CM

## 2017-02-18 LAB — BASIC METABOLIC PANEL
ANION GAP: 6 (ref 5–15)
BUN: 8 mg/dL (ref 6–20)
CHLORIDE: 96 mmol/L — AB (ref 101–111)
CO2: 31 mmol/L (ref 22–32)
Calcium: 8.5 mg/dL — ABNORMAL LOW (ref 8.9–10.3)
Creatinine, Ser: 0.76 mg/dL (ref 0.61–1.24)
GFR calc non Af Amer: >60 mL/min (ref >60)
Glucose, Bld: 181 mg/dL — ABNORMAL HIGH (ref 65–99)
POTASSIUM: 3.2 mmol/L — AB (ref 3.5–5.1)
Sodium: 133 mmol/L — ABNORMAL LOW (ref 135–145)

## 2017-02-18 LAB — CBC
HEMATOCRIT: 40.5 % (ref 39.0–52.0)
HEMOGLOBIN: 13.9 g/dL (ref 13.0–17.0)
MCH: 35.7 pg — AB (ref 26.0–34.0)
MCHC: 34.3 g/dL (ref 30.0–36.0)
MCV: 104.1 fL — AB (ref 78.0–100.0)
Platelets: 292 10*3/uL (ref 150–400)
RBC: 3.89 MIL/uL — AB (ref 4.22–5.81)
RDW: 13.1 % (ref 11.5–15.5)
WBC: 6.2 10*3/uL (ref 4.0–10.5)

## 2017-02-18 LAB — ACID FAST SMEAR (AFB, MYCOBACTERIA): Acid Fast Smear: NEGATIVE

## 2017-02-18 LAB — FOLATE: Folate: 10.4 ng/mL (ref >5.9)

## 2017-02-18 LAB — ACID FAST SMEAR (AFB)

## 2017-02-18 LAB — VITAMIN B12: Vitamin B-12: 168 pg/mL — ABNORMAL LOW (ref 180–914)

## 2017-02-18 MED ORDER — CYANOCOBALAMIN 1000 MCG/ML IJ SOLN
1000.0000 ug | Freq: Once | INTRAMUSCULAR | Status: AC
  Administered 2017-02-18: 1000 ug via INTRAMUSCULAR
  Filled 2017-02-18: qty 1

## 2017-02-18 MED ORDER — GADOBENATE DIMEGLUMINE 529 MG/ML IV SOLN
20.0000 mL | Freq: Once | INTRAVENOUS | Status: AC | PRN
  Administered 2017-02-18: 15 mL via INTRAVENOUS

## 2017-02-18 NOTE — Clinical Social Work Note (Signed)
Clinical Social Work Assessment  Patient Details  Name: [name] MRN: [id number] Date of Birth: 1965/05/27  Date of referral:  02/18/17               Reason for consult:  Facility Placement, Housing Concerns/Homelessness, Substance Use/ETOH Abuse                Permission sought to share information with:  Case Manager Permission granted to share information::  Yes, Verbal Permission Granted  Name::     mom, Ms. [name]  Agency::  SNF  Relationship::     Contact Information:     Housing/Transportation Living arrangements for the past 2 months:  Barrister's clerk of Information:  Parent Patient Interpreter Needed:  None Criminal Activity/Legal Involvement Pertinent to Current Situation/Hospitalization:  No - Comment as needed Significant Relationships:  Parents, Siblings Lives with:  Other (Comment) (Homeless) Do you feel safe going back to the place where you live?  Yes Need for family participation in patient care:  No (Coment)  Care giving concerns:  Pt was homeless prior to hospitalization. Per mom at bedside, patient stays at [place] or in his vehicle. Pt has a history of drug use. Pt has worked various jobs, with no stable employment history. He has a brother and children that he has minimal contact.  Pt unable to care for self independently as he has no stable place to reside.   Social Worker assessment / plan:  CSW met with mother at bedside to discuss disposition. CSW explained her role and the SNF process if patient will need continued IV medication. CSW explained that patient may have to go to SNF out of [place] area if no local SNF's will take patient due to history of substance use.  CSW will f/u for approval for LOG placement for patient.  Employment status:  Unemployed Forensic scientist:  Self Pay (Medicaid Pending) PT Recommendations:  Not assessed at this time Information / Referral to community resources:  [place]  Patient/Family's Response to care:  Family appreciative of CSW assistance. No issues identified at this time.  Patient/Family's Understanding of and Emotional Response to Diagnosis, Current Treatment, and Prognosis:  Family has good understanding of the diagnosis, current treatment and prognosis.  Pt unhappy with pain and having to stick with the pain medication schedule. CSW validated and confirmed possible SNF placement outside of the area. Pt in agreement with SNF placement for duration of antibiotic medications. No other issues identified at this time.  Emotional Assessment Appearance:  Appears stated age Attitude/Demeanor/Rapport:  Lethargic, Sedated Affect (typically observed):  Other (Asleep) Orientation:  Oriented to Self, Oriented to Place, Oriented to  Time, Oriented to Situation Alcohol / Substance use:  Illicit Drugs, Alcohol Use Psych involvement (Current and /or in the community):  No (Comment)  Discharge Needs  Concerns to be addressed:  Care Coordination Readmission within the last 30 days:  Yes Current discharge risk:  Physical Impairment, Dependent with Mobility Barriers to Discharge:  No Barriers Identified   Normajean Baxter, LCSW 02/18/2017, 4:04 PM

## 2017-02-18 NOTE — Progress Notes (Cosign Needed)
Family Medicine Teaching Service Daily Progress Note Intern Pager: [phone]  Patient name: [name] Medical record number: [id number] Date of birth: 03-29-1966 Age: 51 y.o. Gender: male  Primary Care Provider: Patient, No Pcp Per Consultants: ID, IR Code Status: FULL  Pt Overview and Major Events to Date:  Admitted 9/29 for   Assessment and Plan: Sajan Cheatwood Johnsonis a 51 y.o.malepresenting with back pain. PMH is significant for substance abuse (cocaine, heroin). Also with reported prior diagnosis of COPD, CHF, and pulm HTN.   Chronic Back Pain: Likely 2/2 osteomyelitis and discitis as shown on MRI lumbar spine. Could also be due to spinal/epidural abscess. Unlikely to be due to herniated disc vs muscular strain given imaging and no preceding trauma. Patient states for the past 3 weeks he has had increased back pain. He does endorse IV heroin use. No leukocytosis. LA < 2.0 x 2. ESR 35, CRP 1.5. No murmur on exam to suggest seeding.  - Vitals per floor - BCx x 2 - Trend CBC, BMP - Thoracic MRI to rule out abscess in the thoracic spine. As patient recently received contrast for L spine MRI, will have to wait 12 hours before additional MRI can be performed (must be performed without contrast, otherwise wait 48 hrs; ~ 1200 02/18/17). Plan to wait until 10/1 to perform with contrast, as looking for abscess.  - dilaudid 1 mg IV q4h prn for severe pain; - s/p 3 doses of cefepime and 2 doses of vancomycin - ID consulted; appreciate recommendations; plan to hold IV antibiotics until disc aspiration culture results - IR to sampled disc fluid, Ordered fluid culture, aerobic, anaerobic, mycobacterial, and fungal cultures; spoke with lab who said histopathology not available today  Hx of Polysubstance Abuse:Patient endorsed using cocaine to myself and history of marijuana. Endorsed IV drug use to ED provider. UDS positive for cocaine and opiates. HIV screen NR. - Hepatitis panel;  HCV Ab >11.0   Hx of Tobacco Use Disorder.Patient has long history of tobacco use. Currently smokes  - Provided counseling on quitting and health benefits of stopping smoking. - '14mg'$  Nicotine patch  Hx of Alcohol Use Disorder:Patient has history of heavy alcohol intake. CIWA 6<6<2<3.  - CIWA protocol (lorazapam q4 scheduled and q4prn)  HTN: Suspect 2/2 pain - prn hydralazine 5 mg q4h for SBP>180, DBP>110  CBC with macrocytosis: - Obtained folate  (10.4) and B12 (168)levels  - B12 shot  [facility] states he was diagnosed with CHF in prison recently. He states he is not currently taking any medications and was never started on any. He is not having any active signs or symptoms of CHF exacerbation at this time. - Consider echo cardiogram  - Consider starting him on optimal therapy.  FEN/GI: Regular diet Prophylaxis: SCDs  Disposition: inpatient pending plan for antibiotics  Subjective:  Stated he was not happy with his current pain regimen or his CIWA protocol.   He said he was in a lot of pain and wanted more medication.   No new symptoms noted  Objective: Temp:  [98.6 F (37 C)-99.3 F (37.4 C)] 98.6 F (37 C) (10/01 0500) Pulse Rate:  [66-87] 66 (10/01 0500) Resp:  [15-32] 20 (10/01 0500) BP: (151-213)/(92-126) 151/102 (10/01 0500) SpO2:  [94 %-99 %] 94 % (10/01 0500) Physical Exam: General: Obese male, resting in bed Cardiovascular: RRR, S1S2, no mrg Respiratory: CTAB, no wheezing noted Abdomen: no belly pain, obese, +BS, NT Neuro: Fully awake and conversational during exam with gross motor control  intact Extremities: Moves all spontaneously  Laboratory:  Recent Labs Lab 02/15/17 2358 02/16/17 1455 02/17/17 0452  WBC 7.9 5.9 8.0  HGB 14.5 13.6 14.1  HCT 41.3 40.7 41.1  PLT 282 273 285    Recent Labs Lab 02/15/17 2358 02/16/17 1455 02/17/17 0452  NA 133* 137 133*  K 3.1* 3.7 3.6  CL 96* 104 99*  CO2 '29 29 28  '$ BUN <5* 8 7  CREATININE 0.86  0.84 0.86  CALCIUM 8.8* 8.7* 8.5*  PROT 8.2* 7.4  --   BILITOT 0.8 0.7  --   ALKPHOS 53 50  --   ALT 53 47  --   AST 34 33  --   GLUCOSE 135* 112* 155*    CIWAs 6<6<3<2  Imaging/Diagnostic Tests: Dg Chest 2 View  Result Date: 02/16/2017 CLINICAL DATA:  51 y/o M; shortness of breath with productive cough. EXAM: CHEST  2 VIEW COMPARISON:  02/06/2017 chest radiograph FINDINGS: The heart size and mediastinal contours are within normal limits. Bronchitic changes in the left infrahilar region. No focal consolidation. The visualized skeletal structures are unremarkable. IMPRESSION: Bronchitic changes in left infrahilar region. No focal consolidation. Electronically Signed   By: Kristine Garbe M.D.   On: 02/16/2017 00:18   Mr Lumbar Spine W Wo Contrast  Result Date: 02/16/2017 CLINICAL DATA:  Increasing back pain over the last 3 months. IV drug abuse. Cauda equina syndrome suspected. EXAM: MRI LUMBAR SPINE WITHOUT AND WITH CONTRAST TECHNIQUE: Multiplanar and multiecho pulse sequences of the lumbar spine were obtained without and with intravenous contrast. CONTRAST:  <20 ml > MULTIHANCE GADOBENATE DIMEGLUMINE 529 MG/ML IV SOLN COMPARISON:  Acute abdominal series done 02/06/2017. abdominopelvic CT 02/06/2017. FINDINGS: Segmentation: Conventional anatomy assumed, with the last open disc space designated L5-S1. This correlates with prior imaging. Alignment: Mild straightening. There are bilateral L5 pars defects with 5 mm of anterolisthesis at L5-S1. Vertebrae: The marrow signal is diffusely heterogeneous on T2 and T1 weighted images. As above, there are chronic L5 pars defects. There are changes of diskitis and osteomyelitis at T11-12 with narrowing, T2 hyperintensity and enhancement of the intervertebral disc. There is also bone marrow edema and enhancement within the T11 and T12 vertebral bodies. Of note, the most superior visualization on this lumbar spine study is at the superior aspect of the  T11 vertebral body. There are no changes of discitis or osteomyelitis in the lumbar spine. The visualized sacroiliac joints appear unremarkable. Conus medullaris: Extends to the T12 level and appears normal. No abnormal intradural enhancement or distal cord compression identified. Paraspinal and other soft tissues: There is paraspinal inflammation and enhancement at T11-12. No epidural fluid collection is identified. Disc levels: T11-12: As above, changes of diskitis and osteomyelitis with paraspinal inflammation. There is associated disc bulging with mild mass effect on the thecal sac. No foraminal compromise or nerve root encroachment seen. T12-L1: Disc and endplate degeneration with anterior osteophytes and mild disc bulging. This level was not imaged in the axial plane. No evidence of nerve root encroachment. No significant acquired findings from L1-2 through L3-4. L4-5: Disc height and hydration are maintained. Minimal disc bulging and mild facet hypertrophy. No spinal stenosis or nerve root encroachment. L5-S1: The disc is degenerated with annular bulging and uncovering related to the pars defects and resulting anterolisthesis. The spinal canal and lateral recesses are patent. There is moderate foraminal narrowing, right-greater-than-left, with possible bilateral L5 nerve root encroachment. IMPRESSION: 1. Findings are consistent with discitis and osteomyelitis at the T11-12 level.  The superior extent of this may be incompletely visualized by this examination. 2. No conus compression or abnormal intradural enhancement identified. 3. 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 Electronically Signed   By: Richardean Sale M.D.   On: 02/16/2017 11:31   Ir Fluoro Guide Ndl Plmt / Bx  Result Date: 02/17/2017 INDICATION: Concern for discitis/osteomyelitis involving T11-T12. Please perform  fluoroscopic guided disc aspiration for diagnostic purposes. EXAM: IR FLUORO GUIDE NEEDLE PLACEMENT /BIOPSY COMPARISON:  Lumbar spine MRI - 02/16/2017; CT abdomen and pelvis - 02/06/2017 MEDICATIONS: None ANESTHESIA/SEDATION: Moderate (conscious) sedation was employed during this procedure. A total of Fentanyl 100 mcg was administered intravenously. Moderate Sedation Time: 10 minutes. The patient's level of consciousness and vital signs were monitored continuously by radiology nursing throughout the procedure under my direct supervision. CONTRAST:  None FLUOROSCOPY TIME:  2 minutes, 6 seconds (604.5 mGy) COMPLICATIONS: None immediate. PROCEDURE: Informed written consent was obtained from the patient after a discussion of the risks, benefits and alternatives to treatment. A timeout was performed prior to the initiation of the procedure. The patient was positioned prone on the fluoroscopy table. The T11-T12 intervertebral disc space was marked fluoroscopically. Utilizing an oblique, left-sided approach, a 20 gauge Francine needle was a advanced into the T11-T12 intervertebral disc. Appropriate positioning was confirmed with the fluoroscopic imaging in both the anterior and lateral projections. Multiple spot fluoroscopic images were saved for procedural documentation purposes. With the needle appropriate position, approximately 2 cc of slightly purulent appearing fluid was aspirated from the disc space. All fluid was capped and sent to the laboratory for analysis. The needle was removed and superficial hemostasis was achieved with manual compression. A dressing was placed. The patient tolerated the procedure well without immediate postprocedural complication. IMPRESSION: Technically successful fluoroscopic guided T11-T12 disc aspiration. Electronically Signed   By: Sandi Mariscal M.D.   On: 02/17/2017 12:55     Sherene Sires, DO 02/18/2017, 6:38 AM PGY-1, [facility] Intern pager: [phone], text  pages welcome

## 2017-02-18 NOTE — Progress Notes (Signed)
[facility] for Infectious Disease  Date of Admission:  02/16/2017     Total days of antibiotics  3  Anidulafungin 9/30  Cefepime 9/29  Vancomycin 9/29         ASSESSMENT: T11-12 Discitis IVDA (cocaine, heroin) hypertension Homelessness Hep C Ab+ HIV (-)  PLAN: 1. Continue current regimen of Eraxis, Vancomycin, Cefepime while awaiting culture data  2. With fungal component will likely need to bring surgery into his care 3. Awaiting TEE to be performed  4. Will discuss with support team re: assistance with detox therapy while in house with hopes of outpatient treatment. He may be getting too much opioid (6 mg Dilaudid and 100 mcg Fentanyl yesterday) with his understandable amount of back pain given his current condition.  5. Hep C RNA pending     Active Problems:   Osteomyelitis ([place])   . acetaminophen  650 mg Oral Q6H  . albuterol  5 mg Nebulization Once  . enoxaparin (LOVENOX) injection  40 mg Subcutaneous Q24H  . folic acid  1 mg Oral Daily  . ketorolac  30 mg Intramuscular Once  . LORazepam  0-4 mg Intravenous Q6H   Followed by  . LORazepam  0-4 mg Intravenous Q12H  . multivitamin with minerals  1 tablet Oral Daily  . nicotine  21 mg Transdermal Daily  . sodium chloride flush  3 mL Intravenous Q12H  . thiamine  100 mg Oral Daily    SUBJECTIVE: HPI:  Patient reports severe back pain for at least 3 weeks. Now diagnosed with discitis/osteomyelitis at the T11-12 level. No epidural fluid collection identified on MRI of lumbar spine. Awaiting MRI of thoracic spine to see extent of superior involvement. IR aspirate of fluid now with few yeast identified on culture; awaiting ID/sensis. BCx pending.   Afebrile overnight. Hypertensive. Uncomfortable and irritated he hasn't gotten pain medication yet, however he pulled out his IV and was just recently replaced. Getting ready to go down for MRI of T-Spine.   Review of Systems: Review of Systems    Constitutional: Positive for diaphoresis. Negative for fever.  Respiratory: Negative for cough and sputum production.   Cardiovascular: Negative for chest pain and palpitations.  Gastrointestinal: Negative for nausea and vomiting.       Denies incontinence  Genitourinary: Negative for dysuria.       Denies incontinence  Musculoskeletal: Positive for back pain.  Skin: Negative for rash.  Neurological: Negative for tingling, sensory change and headaches.  Psychiatric/Behavioral: Positive for substance abuse.    Allergies  Allergen Reactions  . Penicillins Other (See Comments)    Has patient had a PCN reaction causing immediate rash, facial/tongue/throat swelling, SOB or lightheadedness with hypotension: Unknow Has patient had a PCN reaction causing severe rash involving mucus membranes or skin necrosis: Unknown Has patient had a PCN reaction that required hospitalization unknown Has patient had a PCN reaction occurring within the last 10 years: No If all of the above answers are "NO", then may proceed with Cephalosporin use.   [name] Penicillins Other (See Comments)    OBJECTIVE: Vitals:   02/17/17 1845 02/17/17 2010 02/17/17 2214 02/18/17 0500  BP: (!) 197/108 (!) 206/108 (!) 157/92 (!) 151/102  Pulse: 87 85 82 66  Resp: (!) 30 (!) 24  20  Temp: 99.3 F (37.4 C) 98.8 F (37.1 C)  98.6 F (37 C)  TempSrc: Oral Oral  Oral  SpO2: 95% 95%  94%   There is  no height or weight on file to calculate BMI.  Physical Exam  Constitutional: He is oriented to person, place, and time.  Uncomfortable lying in the bed. Sweating. Pulled out his IV and just had it replaced and is getting pain medications.   Eyes: Pupils are equal, round, and reactive to light. No scleral icterus.  Cardiovascular: Normal heart sounds.  Tachycardia present.   No murmur heard. Pulmonary/Chest: Effort normal and breath sounds normal. No respiratory distress.  Coarse breath sounds to upper airways   Abdominal:  Soft. He exhibits no distension.  Neurological: He is oriented to person, place, and time. He has normal sensation and normal strength. He is agitated.  Skin: Skin is warm.    Lab Results Lab Results  Component Value Date   WBC 6.2 02/18/2017   HGB 13.9 02/18/2017   HCT 40.5 02/18/2017   MCV 104.1 (H) 02/18/2017   PLT 292 02/18/2017    Lab Results  Component Value Date   CREATININE 0.76 02/18/2017   BUN 8 02/18/2017   NA 133 (L) 02/18/2017   K 3.2 (L) 02/18/2017   CL 96 (L) 02/18/2017   CO2 31 02/18/2017    Lab Results  Component Value Date   ALT 47 02/16/2017   AST 33 02/16/2017   ALKPHOS 50 02/16/2017   BILITOT 0.7 02/16/2017     Microbiology: Recent Results (from the past 240 hour(s))  Aerobic Culture (superficial specimen)     Status: None (Preliminary result)   Collection Time: 02/17/17 12:34 PM  Result Value Ref Range Status   Specimen Description FLUID  Final   Special Requests DISC T11 12  Final   Gram Stain   Final    ABUNDANT WBC PRESENT, PREDOMINANTLY PMN RARE BUDDING YEAST SEEN    Culture PENDING  Incomplete   Report Status PENDING  Incomplete    Janene Madeira, MSN, NP-C [facility] Pager: [phone]  02/18/2017  9:12 AM

## 2017-02-19 ENCOUNTER — Other Ambulatory Visit (HOSPITAL_COMMUNITY): Payer: Self-pay

## 2017-02-19 ENCOUNTER — Inpatient Hospital Stay (HOSPITAL_COMMUNITY): Payer: Self-pay

## 2017-02-19 DIAGNOSIS — M4620 Osteomyelitis of vertebra, site unspecified: Secondary | ICD-10-CM

## 2017-02-19 DIAGNOSIS — I340 Nonrheumatic mitral (valve) insufficiency: Secondary | ICD-10-CM

## 2017-02-19 LAB — ECHOCARDIOGRAM COMPLETE
AOASC: 39 cm
CHL CUP MV DEC (S): 282
CHL CUP RV SYS PRESS: 47 mmHg
CHL CUP TV REG PEAK VELOCITY: 283 cm/s
E decel time: 282 msec
FS: 38 % (ref 28–44)
IVS/LV PW RATIO, ED: 1.42
LA ID, A-P, ES: 42 mm
LA diam end sys: 42 mm
LA diam index: 1.88 cm/m2
LA vol A4C: 64.1 ml
LA vol index: 32.9 mL/m2
LA vol: 73.7 mL
LDCA: 3.14 cm2
LV e' LATERAL: 7.94 cm/s
LVOT diameter: 20 mm
Lateral S' vel: 16 cm/s
MVPKEVEL: 0.9 m/s
PW: 12 mm — AB (ref 0.6–1.1)
TAPSE: 20.9 mm
TDI e' lateral: 7.94
TDI e' medial: 5.33
TR max vel: 283 cm/s
Weight: 3562.63 oz

## 2017-02-19 LAB — AEROBIC CULTURE W GRAM STAIN (SUPERFICIAL SPECIMEN)

## 2017-02-19 LAB — BASIC METABOLIC PANEL
ANION GAP: 6 (ref 5–15)
BUN: 6 mg/dL (ref 6–20)
CHLORIDE: 96 mmol/L — AB (ref 101–111)
CO2: 33 mmol/L — ABNORMAL HIGH (ref 22–32)
Calcium: 8.7 mg/dL — ABNORMAL LOW (ref 8.9–10.3)
Creatinine, Ser: 0.74 mg/dL (ref 0.61–1.24)
GFR calc Af Amer: >60 mL/min (ref >60)
Glucose, Bld: 141 mg/dL — ABNORMAL HIGH (ref 65–99)
POTASSIUM: 3.8 mmol/L (ref 3.5–5.1)
SODIUM: 135 mmol/L (ref 135–145)

## 2017-02-19 LAB — AEROBIC CULTURE  (SUPERFICIAL SPECIMEN)

## 2017-02-19 MED ORDER — FLUCONAZOLE 200 MG PO TABS
800.0000 mg | ORAL_TABLET | Freq: Every day | ORAL | Status: DC
  Administered 2017-02-19 – 2017-02-20 (×2): 800 mg via ORAL
  Filled 2017-02-19 (×2): qty 4

## 2017-02-19 MED ORDER — HYDROMORPHONE HCL 1 MG/ML IJ SOLN
2.0000 mg | Freq: Four times a day (QID) | INTRAMUSCULAR | Status: DC | PRN
  Administered 2017-02-19 – 2017-02-20 (×4): 2 mg via INTRAVENOUS
  Filled 2017-02-19 (×4): qty 2

## 2017-02-19 NOTE — Progress Notes (Signed)
Neurosurgery consult called in for spinal phlegmon/osteomyelitis/discitis

## 2017-02-19 NOTE — Care Management (Signed)
Case manager is following for discharge plan. Should he require PICC for IV antibiotics he will need to go to SNF. Social worker is aware.

## 2017-02-19 NOTE — Progress Notes (Signed)
Family Medicine Teaching Service Daily Progress Note Intern Pager: [phone]  Patient name: [name] Medical record number: [id number] Date of birth: [birth year] Age: 51 y.o. Gender: male  Primary Care Provider: Patient, No Pcp Per Consultants: ID, IR Code Status: FULL  Pt Overview and Major Events to Date:  Admitted 9/29 for   Assessment and Plan: Randall Petroni Johnsonis a 51 y.o.malepresenting with back pain. PMH is significant for substance abuse (cocaine, heroin). Also with reported prior diagnosis of COPD, CHF, and pulm HTN.   Chronic Back Pain: Likely 2/2 osteomyelitis and discitis as shown on MRI lumbar spine. Also shows phlegmon.  Patient states for the past 3 weeks he has had increased back pain. He does endorse IV heroin use. No leukocytosis. LA < 2.0 x 2. ESR 35, CRP 1.5. No murmur on exam to suggest seeding.   -Day 3 eraxis - Vitals per floor - BCx x 2 - Trend CBC, BMP - dilaudid 1 mg IV q4h prn for severe pain; - s/p 3 doses of cefepime and 2 doses of vancomycin - IR to sampled disc fluid, still only fungal growth -consult neurosurgery  Hx of Polysubstance Abuse:Patient endorsed using cocaine to myself and history of marijuana. Endorsed IV drug use to ED provider. UDS positive for cocaine and opiates. HIV screen NR. - Hepatitis panel; HCV Ab >11.0   Hx of Tobacco Use Disorder.Patient has long history of tobacco use. Currently smokes  - Provided counseling on quitting and health benefits of stopping smoking. - '14mg'$  Nicotine patch  Hx of Alcohol Use Disorder:Patient has history of heavy alcohol intake. CIWA 12,11,0,0 - CIWA protocol (lorazapam q4 scheduled and q4prn) -continue scheduled  HTN: Suspect 2/2 pain - prn hydralazine 5 mg q4h for SBP>180, DBP>110  CBC with macrocytosis: b12 shot given  MBT:DHRCBUL states he was diagnosed with CHF in prison recently. He states he is not currently taking any medications and was never started on  any. He is not having any active signs or symptoms of CHF exacerbation at this time. - TTEcho ordered through [employer] -TEE scheduled tentatively for Thursday due to the backlog in the their OR  FEN/GI: Regular diet Prophylaxis: SCDs  Disposition: inpatient pending plan for antibiotics  Subjective:  Patient's main concern is pain medication once I woke him up, wants to know what the next option is  Objective: Temp:  [98.3 F (36.8 C)-98.7 F (37.1 C)] 98.7 F (37.1 C) (10/01 2046) Pulse Rate:  [68-76] 76 (10/01 2046) Resp:  [19-20] 20 (10/01 2046) BP: (156-172)/(93-104) 172/104 (10/01 2046) SpO2:  [96 %-97 %] 97 % (10/01 2046) Weight:  [222 lb 10.6 oz (101 kg)] 222 lb 10.6 oz (101 kg) (10/01 1500) Physical Exam: General: Obese male, uncomforably turning in bed once awakened Cardiovascular: RRR, S1S2, no mrg Respiratory: CTAB, no wheezing noted Abdomen: no belly pain, obese, +BS, NT Neuro: Fully awake and conversational during exam with gross motor control intact Extremities: Moves all spontaneously  Laboratory:  Recent Labs Lab 02/16/17 1455 02/17/17 0452 02/18/17 0554  WBC 5.9 8.0 6.2  HGB 13.6 14.1 13.9  HCT 40.7 41.1 40.5  PLT 273 285 292    Recent Labs Lab 02/15/17 2358 02/16/17 1455 02/17/17 0452 02/18/17 0554  NA 133* 137 133* 133*  K 3.1* 3.7 3.6 3.2*  CL 96* 104 99* 96*  CO2 '29 29 28 31  '$ BUN <5* '8 7 8  '$ CREATININE 0.86 0.84 0.86 0.76  CALCIUM 8.8* 8.7* 8.5* 8.5*  PROT 8.2* 7.4  --   --  BILITOT 0.8 0.7  --   --   ALKPHOS 53 50  --   --   ALT 53 47  --   --   AST 34 33  --   --   GLUCOSE 135* 112* 155* 181*    CIWAs 12<11<0<0  Imaging/Diagnostic Tests: Dg Chest 2 View  Result Date: 02/16/2017 CLINICAL DATA:  51 y/o M; shortness of breath with productive cough. EXAM: CHEST  2 VIEW COMPARISON:  02/06/2017 chest radiograph FINDINGS: The heart size and mediastinal contours are within normal limits. Bronchitic changes in the left infrahilar  region. No focal consolidation. The visualized skeletal structures are unremarkable. IMPRESSION: Bronchitic changes in left infrahilar region. No focal consolidation. Electronically Signed   By: Kristine Garbe M.D.   On: 02/16/2017 00:18   Mr Lumbar Spine W Wo Contrast  Result Date: 02/16/2017 CLINICAL DATA:  Increasing back pain over the last 3 months. IV drug abuse. Cauda equina syndrome suspected. EXAM: MRI LUMBAR SPINE WITHOUT AND WITH CONTRAST TECHNIQUE: Multiplanar and multiecho pulse sequences of the lumbar spine were obtained without and with intravenous contrast. CONTRAST:  <20 ml > MULTIHANCE GADOBENATE DIMEGLUMINE 529 MG/ML IV SOLN COMPARISON:  Acute abdominal series done 02/06/2017. abdominopelvic CT 02/06/2017. FINDINGS: Segmentation: Conventional anatomy assumed, with the last open disc space designated L5-S1. This correlates with prior imaging. Alignment: Mild straightening. There are bilateral L5 pars defects with 5 mm of anterolisthesis at L5-S1. Vertebrae: The marrow signal is diffusely heterogeneous on T2 and T1 weighted images. As above, there are chronic L5 pars defects. There are changes of diskitis and osteomyelitis at T11-12 with narrowing, T2 hyperintensity and enhancement of the intervertebral disc. There is also bone marrow edema and enhancement within the T11 and T12 vertebral bodies. Of note, the most superior visualization on this lumbar spine study is at the superior aspect of the T11 vertebral body. There are no changes of discitis or osteomyelitis in the lumbar spine. The visualized sacroiliac joints appear unremarkable. Conus medullaris: Extends to the T12 level and appears normal. No abnormal intradural enhancement or distal cord compression identified. Paraspinal and other soft tissues: There is paraspinal inflammation and enhancement at T11-12. No epidural fluid collection is identified. Disc levels: T11-12: As above, changes of diskitis and osteomyelitis with  paraspinal inflammation. There is associated disc bulging with mild mass effect on the thecal sac. No foraminal compromise or nerve root encroachment seen. T12-L1: Disc and endplate degeneration with anterior osteophytes and mild disc bulging. This level was not imaged in the axial plane. No evidence of nerve root encroachment. No significant acquired findings from L1-2 through L3-4. L4-5: Disc height and hydration are maintained. Minimal disc bulging and mild facet hypertrophy. No spinal stenosis or nerve root encroachment. L5-S1: The disc is degenerated with annular bulging and uncovering related to the pars defects and resulting anterolisthesis. The spinal canal and lateral recesses are patent. There is moderate foraminal narrowing, right-greater-than-left, with possible bilateral L5 nerve root encroachment. IMPRESSION: 1. Findings are consistent with discitis and osteomyelitis at the T11-12 level. The superior extent of this may be incompletely visualized by this examination. 2. No conus compression or abnormal intradural enhancement identified. 3. 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 Electronically Signed   By: Richardean Sale M.D.   On: 02/16/2017 11:31   Ir Fluoro Guide Ndl Plmt / Bx  Result Date: 02/17/2017 INDICATION: Concern for discitis/osteomyelitis involving T11-T12. Please perform fluoroscopic guided disc  aspiration for diagnostic purposes. EXAM: IR FLUORO GUIDE NEEDLE PLACEMENT /BIOPSY COMPARISON:  Lumbar spine MRI - 02/16/2017; CT abdomen and pelvis - 02/06/2017 MEDICATIONS: None ANESTHESIA/SEDATION: Moderate (conscious) sedation was employed during this procedure. A total of Fentanyl 100 mcg was administered intravenously. Moderate Sedation Time: 10 minutes. The patient's level of consciousness and vital signs were monitored continuously by radiology nursing throughout the  procedure under my direct supervision. CONTRAST:  None FLUOROSCOPY TIME:  2 minutes, 6 seconds (115.7 mGy) COMPLICATIONS: None immediate. PROCEDURE: Informed written consent was obtained from the patient after a discussion of the risks, benefits and alternatives to treatment. A timeout was performed prior to the initiation of the procedure. The patient was positioned prone on the fluoroscopy table. The T11-T12 intervertebral disc space was marked fluoroscopically. Utilizing an oblique, left-sided approach, a 20 gauge Francine needle was a advanced into the T11-T12 intervertebral disc. Appropriate positioning was confirmed with the fluoroscopic imaging in both the anterior and lateral projections. Multiple spot fluoroscopic images were saved for procedural documentation purposes. With the needle appropriate position, approximately 2 cc of slightly purulent appearing fluid was aspirated from the disc space. All fluid was capped and sent to the laboratory for analysis. The needle was removed and superficial hemostasis was achieved with manual compression. A dressing was placed. The patient tolerated the procedure well without immediate postprocedural complication. IMPRESSION: Technically successful fluoroscopic guided T11-T12 disc aspiration. Electronically Signed   By: Sandi Mariscal M.D.   On: 02/17/2017 12:55     Sherene Sires, DO 02/19/2017, 6:27 AM PGY-1, [facility] Intern pager: [phone], text pages welcome

## 2017-02-19 NOTE — Social Work (Signed)
CSW met with patient at bedside to discuss the SNF placement necessary for him to have IV antibiotics. Pt amenable, however, struggling to manage his pain. CSW listened and validated.  CSW shared that beds may not be in the local area and can be further out. Pt agreeable with same.  CSW will f/u for SNF placement as patient does not have insurance and has a history of substance use.  CSW will f/u with clinical supervisor for authorization for LOG placement.  Randall Hefty, LCSW Clinical Social Worker [phone]

## 2017-02-19 NOTE — Progress Notes (Signed)
Internal Medicine Clinic Attending  I saw [name] today at the request of Dr. Tommy Medal regarding suboxone therapy.  [name] is an IV heroin user along with intermittent cocaine use.  He notes that he has been using heroin for about 4-5 months and that he got started by using prescription pain medications for pain.  He has never had complications from his IV drug use until this hospitalization.  He has never tried suboxone or attempted sobriety before.  He is currently homeless, but working doing remodeling.  He had a UDS which was cocaine and opiate positive on admission.  He has been having a significant amount of pain and was somewhat somenolent when I saw him, falling asleep between questions.  He did note being interested in suboxone and understanding that he would need an 8-12 hour abstinence from short acting opiates prior to starting to avoid instigated withdrawal.    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  I will need to discuss further with him prior to starting suboxone therapy in the hospital as he still is having a large pain component and currently receiving IV dilaudid.    I discussed with Dr. Nori Riis, attending for the [employer] teaching program who is supportive of plans to start suboxone.  I will reassess the patient on Thursday as he will need to be assessed by NSG for imaging findings.    Gilles Chiquito, MD

## 2017-02-19 NOTE — Progress Notes (Signed)
Subjective:  C.o back pain    Antibiotics:  Anti-infectives    Start     Dose/Rate Route Frequency Ordered Stop   02/19/17 1900  fluconazole (DIFLUCAN) tablet 800 mg     800 mg Oral Daily 02/19/17 1857     02/18/17 1700  anidulafungin (ERAXIS) 100 mg in sodium chloride 0.9 % 100 mL IVPB  Status:  Discontinued     100 mg 78 mL/hr over 100 Minutes Intravenous Every 24 hours 02/17/17 1728 02/19/17 1857   02/17/17 2200  vancomycin (VANCOCIN) IVPB 1000 mg/200 mL premix  Status:  Discontinued     1,000 mg 200 mL/hr over 60 Minutes Intravenous Every 8 hours 02/17/17 2147 02/18/17 0950   02/17/17 2200  ceFEPIme (MAXIPIME) 2 g in dextrose 5 % 50 mL IVPB  Status:  Discontinued     2 g 100 mL/hr over 30 Minutes Intravenous Every 8 hours 02/17/17 2147 02/18/17 1024   02/17/17 1730  anidulafungin (ERAXIS) 200 mg in sodium chloride 0.9 % 200 mL IVPB    Comments:  Please start 100mg  iVPB daily tomorrow   200 mg 78 mL/hr over 200 Minutes Intravenous  Once 02/17/17 1728 02/17/17 2347   02/17/17 0000  vancomycin (VANCOCIN) IVPB 1000 mg/200 mL premix  Status:  Discontinued     1,000 mg 200 mL/hr over 60 Minutes Intravenous Every 8 hours 02/16/17 1642 02/17/17 0917   02/16/17 1430  ceFEPIme (MAXIPIME) 2 g in dextrose 5 % 50 mL IVPB  Status:  Discontinued     2 g 100 mL/hr over 30 Minutes Intravenous Every 8 hours 02/16/17 1416 02/17/17 0917   02/16/17 1415  vancomycin (VANCOCIN) 2,000 mg in sodium chloride 0.9 % 500 mL IVPB     2,000 mg 250 mL/hr over 120 Minutes Intravenous  Once 02/16/17 1405 02/16/17 1805   02/16/17 1330  piperacillin-tazobactam (ZOSYN) IVPB 3.375 g  Status:  Discontinued     3.375 g 100 mL/hr over 30 Minutes Intravenous  Once 02/16/17 1332 02/16/17 1413      Medications: Scheduled Meds: . acetaminophen  650 mg Oral Q6H  . albuterol  5 mg Nebulization Once  . enoxaparin (LOVENOX) injection  40 mg Subcutaneous Q24H  . fluconazole  800 mg Oral Daily  . folic  acid  1 mg Oral Daily  . ketorolac  30 mg Intramuscular Once  . LORazepam  0-4 mg Intravenous Q12H  . multivitamin with minerals  1 tablet Oral Daily  . nicotine  21 mg Transdermal Daily  . sodium chloride flush  3 mL Intravenous Q12H  . thiamine  100 mg Oral Daily   Continuous Infusions: PRN Meds:.albuterol, hydrALAZINE, HYDROmorphone (DILAUDID) injection    Objective: Weight change:   Intake/Output Summary (Last 24 hours) at 02/19/17 1859 Last data filed at 02/19/17 1700  Gross per 24 hour  Intake             1440 ml  Output             1100 ml  Net              340 ml   Blood pressure (!) 164/109, pulse 65, temperature 99.1 F (37.3 C), temperature source Oral, resp. rate 19, weight 222 lb 10.6 oz (101 kg), SpO2 95 %. Temp:  [98.7 F (37.1 C)-99.1 F (37.3 C)] 99.1 F (37.3 C) (10/02 1500) Pulse Rate:  [65-76] 65 (10/02 1500) Resp:  [19-20] 19 (10/02 1500) BP: (164-172)/(104-109) 164/109 (  10/02 1500) SpO2:  [95 %-97 %] 95 % (10/02 1500)  Physical Exam: General: Alert and awake, oriented x3 dysphoric Neuro: nonfocal  CBC:  CBC Latest Ref Rng & Units 02/18/2017 02/17/2017 02/16/2017  WBC 4.0 - 10.5 K/uL 6.2 8.0 5.9  Hemoglobin 13.0 - 17.0 g/dL 13.9 14.1 13.6  Hematocrit 39.0 - 52.0 % 40.5 41.1 40.7  Platelets 150 - 400 K/uL 292 285 273      BMET  Recent Labs  02/18/17 0554 02/19/17 1011  NA 133* 135  K 3.2* 3.8  CL 96* 96*  CO2 31 33*  GLUCOSE 181* 141*  BUN 8 6  CREATININE 0.76 0.74  CALCIUM 8.5* 8.7*     Liver Panel  No results for input(s): PROT, ALBUMIN, AST, ALT, ALKPHOS, BILITOT, BILIDIR, IBILI in the last 72 hours.     Sedimentation Rate  Recent Labs  02/17/17 0452  ESRSEDRATE 35*   C-Reactive Protein  Recent Labs  02/17/17 0452  CRP 1.5*    Micro Results: Recent Results (from the past 720 hour(s))  Urine C&S     Status: None   Collection Time: 02/06/17  5:50 PM  Result Value Ref Range Status   Specimen Description  URINE, RANDOM  Final   Special Requests NONE  Final   Culture   Final    NO GROWTH Performed at [facility], [address], [place], [place] [address]    Report Status 02/08/2017 FINAL  Final  Anaerobic culture     Status: None (Preliminary result)   Collection Time: 02/17/17 12:33 PM  Result Value Ref Range Status   Specimen Description FLUID  Final   Special Requests [place] T11 12  Final   Culture   Final    NO ANAEROBES ISOLATED; CULTURE IN PROGRESS FOR 5 DAYS   Report Status PENDING  Incomplete  Aerobic Culture (superficial specimen)     Status: None   Collection Time: 02/17/17 12:34 PM  Result Value Ref Range Status   Specimen Description FLUID  Final   Special Requests DISC T11 12  Final   Gram Stain   Final    ABUNDANT WBC PRESENT, PREDOMINANTLY PMN RARE BUDDING YEAST SEEN    Culture FEW CANDIDA ALBICANS  Final   Report Status 02/19/2017 FINAL  Final  Culture, fungus without smear     Status: Abnormal (Preliminary result)   Collection Time: 02/17/17 12:34 PM  Result Value Ref Range Status   Specimen Description FLUID  Final   Special Requests DISC T11 12  Final   Culture YEAST IDENTIFICATION TO FOLLOW  (A)  Final   Report Status PENDING  Incomplete  Acid Fast Smear (AFB)     Status: None   Collection Time: 02/17/17 12:38 PM  Result Value Ref Range Status   AFB Specimen Processing Concentration  Final   Acid Fast Smear Negative  Final    Comment: (NOTE) Performed At: [facility] [address] [place], [place] [id number] Lindon Romp MD DX:[id number]    Source (AFB) FLUID  Final    Comment: DISC T11 12  Culture, blood (Routine X 2) w Reflex to ID Panel     Status: None (Preliminary result)   Collection Time: 02/17/17  5:54 PM  Result Value Ref Range Status   Specimen Description BLOOD RIGHT ANTECUBITAL  Final   Special Requests   Final    BOTTLES DRAWN AEROBIC AND ANAEROBIC Blood Culture adequate volume   Culture NO GROWTH 2 DAYS  Final  Report Status PENDING  Incomplete    Studies/Results: Mr Thoracic Spine W Wo Contrast  Result Date: 02/18/2017 CLINICAL DATA:  T11-T12 osteomyelitis discitis. EXAM: MRI THORACIC WITHOUT AND WITH CONTRAST TECHNIQUE: Multiplanar and multiecho pulse sequences of the thoracic spine were obtained without and with intravenous contrast. CONTRAST:  20 cc MultiHance intravenous contrast. COMPARISON:  MRI lumbar spine dated February 16, 2017. FINDINGS: MRI THORACIC SPINE FINDINGS Alignment:  Normal. Vertebrae: Heterogeneous marrow signal with abnormal decreased T1 marrow signal and enhancement of the T11 and T12 vertebral bodies. There is increased T2 signal and enhancement of the T11-T12 intervertebral disc. Findings are consistent with osteomyelitis-discitis. Cord:  Normal signal and morphology. Paraspinal and other soft tissues: Paraspinal inflammation and enhancement at T11-T12. Anterior epidural phlegmon without discrete abscess. Probable sebaceous cyst along the left upper back. 3.6 cm left renal cyst. Disc levels: Scattered degenerative disc disease. A disc bulge at C6-C7 seen only on the sagittal images results in moderate to severe central spinal canal stenosis. Disc bulges from T5-6 through T12-L1 with mild spinal canal narrowing at T10-T11 and T11-T12. No significant neuroforaminal stenosis at any level. IMPRESSION: 1. Osteomyelitis-discitis at T11-T12 with mild anterior epidural phlegmon. No discrete epidural abscess. 2. Scattered degenerative disc disease throughout the thoracic spine with mild spinal canal narrowing at T10-T11 and T11-T12. Electronically Signed   By: Titus Dubin M.D.   On: 02/18/2017 12:59      Assessment/Plan:  INTERVAL HISTORY:  Candida albicans isolated   Active Problems:   Vertebral osteomyelitis ([place])   Diskitis   IVDU (intravenous drug user)   Chronic bilateral low back pain without sciatica   Pyogenic inflammation of bone ([place])    [name] is  a 51 y.o. 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 Kathyrn Sheriff evaluating patient. Surgical treatment is frequently required.  I am changing him to 800mg  of fluconazole which can likely be dropped to 400mg .  He should be treated for 6 months to a year  #2 IVDU: I GREATLY appreciate Dr. Doristine Section help with this patient and plan to institute suboxone.   LOS: 3 days   Alcide Evener 02/19/2017, 6:59 PM

## 2017-02-19 NOTE — Progress Notes (Signed)
Full consult to follow. I have reviewed the patient's electronic chart and his MRI thoracic/lumbar spine. He is apparently neurologically intact, MRI demonstrates osteomyelitis/discitis at T11-12 with small right parasagittal phlegmon without any real epidural abscess and certainly no stenosis or spinal cord compression which might prompt consideration of surgical intervention. Cont with current mgmt.

## 2017-02-19 NOTE — [place] FL2 (Signed)
[place] MEDICAID FL2 LEVEL OF CARE SCREENING TOOL     IDENTIFICATION  Patient Name: [name] Birthdate: 1965-07-18 Sex: male Admission Date (Current Location): 02/16/2017  [facility] and [place] Number:  Herbalist and Address:  [facility], [place] [address], [place], [place] [address]      Provider Number: [id number]  Attending Physician Name and Address:  Lind Covert, MD  Relative Name and Phone Number:  Bary Leriche, mother, [phone]    Current Level of Care: Hospital Recommended Level of Care: [place] Prior Approval Number:    Date Approved/Denied: 02/18/17 PASRR Number: [id number]  Discharge Plan: SNF    Current Diagnoses: Patient Active Problem List   Diagnosis Date Noted  . Diskitis   . IVDU (intravenous drug user)   . Chronic bilateral low back pain without sciatica   . Pyogenic inflammation of bone ([place])   . Vertebral osteomyelitis ([place]) 02/16/2017  . Elevated troponin I level   . SOB (shortness of breath) 10/12/2015  . Troponin level elevated 10/12/2015  . Chest pain 10/12/2015  . Cocaine abuse ([place]) 10/12/2015  . Cocaine abuse ([place]) 10/09/2015  . Homeless single person   . Syncope and collapse 10/08/2015  . SOB (shortness of breath) 10/08/2015  . Chest pain 10/08/2015  . COPD (chronic obstructive pulmonary disease) ([place]) 10/08/2015  . Acute bronchitis 10/08/2015    Orientation RESPIRATION BLADDER Height & Weight     Self, Time, Situation, Place  Normal Continent Weight: 222 lb 10.6 oz (101 kg) Height:     BEHAVIORAL SYMPTOMS/MOOD NEUROLOGICAL BOWEL NUTRITION STATUS      Continent Diet (See DC Summary)  AMBULATORY STATUS COMMUNICATION OF NEEDS Skin   Independent Verbally Surgical wounds (Mid Back Incision)                       Personal Care Assistance Level of Assistance  Dressing, Feeding, Bathing Bathing Assistance: Independent Feeding assistance:  Independent Dressing Assistance: Independent     Functional Limitations Info             SPECIAL CARE FACTORS FREQUENCY                       Contractures      Additional Factors Info  Code Status, Allergies Code Status Info: Full code Allergies Info: PENICILLINS, PENICILLINS            Current Medications (02/19/2017):  This is the current hospital active medication list Current Facility-Administered Medications  Medication Dose Route Frequency Provider Last Rate Last Dose  . acetaminophen (TYLENOL) tablet 650 mg  650 mg Oral Q6H Shirley, [place], DO   650 mg at 02/19/17 0930  . albuterol (PROVENTIL) (2.5 MG/3ML) 0.083% nebulizer solution 2.5 mg  2.5 mg Inhalation Q6H PRN Lockamy, Timothy, DO      . albuterol (PROVENTIL) (2.5 MG/3ML) 0.083% nebulizer solution 5 mg  5 mg Nebulization Once Lockamy, Timothy, DO      . anidulafungin (ERAXIS) 100 mg in sodium chloride 0.9 % 100 mL IVPB  100 mg Intravenous Q24H Campbell Riches, MD   Stopped at 02/18/17 1912  . enoxaparin (LOVENOX) injection 40 mg  40 mg Subcutaneous Q24H Saverio Danker, PA-C      . folic acid (FOLVITE) tablet 1 mg  1 mg Oral Daily Lockamy, Timothy, DO   1 mg at 02/19/17 0937  . hydrALAZINE (APRESOLINE) injection 5 mg  5 mg Intravenous  Q4H PRN Rogue Bussing, MD   5 mg at 02/17/17 7116  . HYDROmorphone (DILAUDID) injection 2 mg  2 mg Intravenous Q6H PRN Bland, Scott, DO      . ketorolac (TORADOL) 30 MG/ML injection 30 mg  30 mg Intramuscular Once Lockamy, Timothy, DO      . LORazepam (ATIVAN) injection 0-4 mg  0-4 mg Intravenous Q12H Lockamy, Timothy, DO   2 mg at 02/18/17 1508  . LORazepam (ATIVAN) tablet 1 mg  1 mg Oral Q6H PRN Lockamy, Timothy, DO   1 mg at 02/16/17 1600   Or  . LORazepam (ATIVAN) injection 1 mg  1 mg Intravenous Q6H PRN Lockamy, Timothy, DO   1 mg at 02/19/17 1201  . multivitamin with minerals tablet 1 tablet  1 tablet Oral Daily Lockamy, Timothy, DO   1 tablet at 02/19/17  0937  . nicotine (NICODERM CQ - dosed in mg/24 hours) patch 21 mg  21 mg Transdermal Daily Lockamy, Timothy, DO   21 mg at 02/19/17 0937  . sodium chloride flush (NS) 0.9 % injection 3 mL  3 mL Intravenous Q12H Lockamy, Timothy, DO   3 mL at 02/19/17 0938  . thiamine (VITAMIN B-1) tablet 100 mg  100 mg Oral Daily Lockamy, Timothy, DO   100 mg at 02/19/17 5790     Discharge Medications: Please see discharge summary for a list of discharge medications.  Relevant Imaging Results:  Relevant Lab Results:   Additional Information SS#:[id number], will need IV antiobotics for about 4 weeks. LOG  Normajean Baxter, LCSW

## 2017-02-19 NOTE — Progress Notes (Signed)
  Echocardiogram 2D Echocardiogram has been performed.  Randa Lynn Khyla Mccumbers 02/19/2017, 3:54 PM

## 2017-02-20 DIAGNOSIS — M869 Osteomyelitis, unspecified: Secondary | ICD-10-CM

## 2017-02-20 LAB — HEPATITIS C VRS RNA DETECT BY PCR-QUAL: HEPATITIS C VRS RNA BY PCR-QUAL: POSITIVE — AB

## 2017-02-20 MED ORDER — LISINOPRIL 20 MG PO TABS
20.0000 mg | ORAL_TABLET | Freq: Every day | ORAL | Status: DC
  Administered 2017-02-20 – 2017-02-21 (×2): 20 mg via ORAL
  Filled 2017-02-20 (×2): qty 1

## 2017-02-20 MED ORDER — OXYCODONE HCL 5 MG PO TABS
10.0000 mg | ORAL_TABLET | Freq: Four times a day (QID) | ORAL | Status: DC | PRN
  Administered 2017-02-20 – 2017-02-21 (×3): 10 mg via ORAL
  Filled 2017-02-20 (×3): qty 2

## 2017-02-20 MED ORDER — FLUCONAZOLE 200 MG PO TABS
400.0000 mg | ORAL_TABLET | Freq: Every day | ORAL | Status: DC
  Filled 2017-02-20: qty 2

## 2017-02-20 MED ORDER — LORAZEPAM 2 MG/ML IJ SOLN
0.0000 mg | Freq: Three times a day (TID) | INTRAMUSCULAR | Status: DC
  Administered 2017-02-20 (×2): 2 mg via INTRAVENOUS
  Administered 2017-02-20: 4 mg via INTRAVENOUS
  Administered 2017-02-21 (×2): 2 mg via INTRAVENOUS
  Filled 2017-02-20: qty 1
  Filled 2017-02-20: qty 2
  Filled 2017-02-20 (×3): qty 1

## 2017-02-20 NOTE — Consult Note (Signed)
Chief Complaint   Chief Complaint  Patient presents with  . Shortness of Breath    CHF  . Back Pain    HPI   HPI: [name] is a 51 y.o. male  who presented to the ER several days ago for worsening back pain along with shortness of breath.   Reports a longstanding history of back pain stemming from when he was a teenager.  Pain is in the right mid lower back. Non-radiating.  He has been able to manage it conservatively with over-the-counter medications.   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  About 4-5 months ago, he developed "some issues with drugs".  He endorses cocaine use, +IVDU.  About a month ago, his lower back pain increased in severity without known cause.  He went to the emergency room and was treated for a lumbar strain advised to use anti-inflammatories. The pain continued to increase in severity so he returned back to the emergency room several days later.  Again, he was treated in the emergency room and then sent home.  Pain continued to escalate in severity.  It is now severe.   Pain continues to be localized to the right lower mid back.  No numbness and tingling in his extremities or weakness in his extremities.  No bowel or bladder dysfunction.  No fevers.  Patient Active Problem List   Diagnosis Date Noted  . Diskitis   . IVDU (intravenous drug user)   . Chronic bilateral low back pain without sciatica   . 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 Elevated troponin I level   . SOB (shortness of breath) 10/12/2015  . Troponin level elevated 10/12/2015  . Chest pain 10/12/2015  . Cocaine abuse ([place]) 10/12/2015  . Cocaine abuse ([place]) 10/09/2015  . Homeless single person   . Syncope and collapse 10/08/2015  . SOB (shortness of breath) 10/08/2015  . Chest pain 10/08/2015  . COPD (chronic obstructive pulmonary disease) ([place]) 10/08/2015  . Acute  bronchitis 10/08/2015    PMH: Past Medical History:  Diagnosis Date  . CHF (congestive heart failure) ([place])   . COPD (chronic obstructive pulmonary disease) ([place])   . Panic attacks   . Tobacco abuse     PSH: Past Surgical History:  Procedure Laterality Date  . CARDIAC CATHETERIZATION N/A 10/14/2015   Procedure: Left Heart Cath and Coronary Angiography;  Surgeon: Sherren Mocha, MD;  Location: [place] [facility];  Service: Cardiovascular;  Laterality: N/A;  . FOOT SURGERY Right   . HAND SURGERY    . IR FLUORO GUIDED NEEDLE PLC ASPIRATION/INJECTION LOC  02/17/2017  . ORIF METACARPAL FRACTURE Left 2011  . ROTATOR CUFF REPAIR Right   . SHOULDER SURGERY      Prescriptions Prior to Admission  Medication Sig Dispense Refill Last Dose  . ibuprofen (ADVIL,MOTRIN) 200 MG tablet Take 200 mg by mouth every 6 (six) hours as needed for moderate pain.   Past Month at Unknown time  . albuterol (PROVENTIL HFA;VENTOLIN HFA) 108 (90 Base) MCG/ACT inhaler Inhale 2 puffs into the lungs every 6 (six) hours as needed for wheezing or shortness of breath. (Patient not taking: Reported on 02/16/2017) 1 Inhaler 2 Not Taking at Unknown time  . cyclobenzaprine (FLEXERIL) 10 MG tablet Take 1 tablet (10 mg total) by mouth 2 (two) times daily as needed for muscle spasms. (Patient not taking:  Reported on 02/16/2017) 10 tablet 0 Not Taking at Unknown time  . guaiFENesin-dextromethorphan (ROBITUSSIN DM) 100-10 MG/5ML syrup Take 5 mLs by mouth every 4 (four) hours as needed for cough. (Patient not taking: Reported on 02/06/2017) 118 mL 0 Completed Course at Unknown time  . ibuprofen (ADVIL,MOTRIN) 600 MG tablet Take 1 tablet (600 mg total) by mouth 3 (three) times daily. (Patient not taking: Reported on 02/16/2017) 30 tablet 0 Not Taking at Unknown time  . methocarbamol (ROBAXIN) 500 MG tablet Take 1 tablet (500 mg total) by mouth 2 (two) times daily as needed for muscle spasms. (Patient not taking: Reported on 02/06/2017) 10  tablet 0 Completed Course at Unknown time  . nicotine (EQ NICOTINE) 21 mg/24hr patch Place 1 patch (21 mg total) onto the skin daily. (Patient not taking: Reported on 02/06/2017) 28 patch 0 Completed Course at Unknown time  . polyethylene glycol (MIRALAX / GLYCOLAX) packet Take 17 g by mouth daily. (Patient not taking: Reported on 02/16/2017) 14 each 0 Not Taking at Unknown time    SH: Social History  Substance Use Topics  . Smoking status: Current Every Day Smoker    Packs/day: 0.50    Types: Cigarettes  . Smokeless tobacco: Never Used  . Alcohol use Yes     Comment: Daily. Heavy.     MEDS: Prior to Admission medications   Medication Sig Start Date End Date Taking? Authorizing Provider  ibuprofen (ADVIL,MOTRIN) 200 MG tablet Take 200 mg by mouth every 6 (six) hours as needed for moderate pain.   Yes [provider]  albuterol (PROVENTIL HFA;VENTOLIN HFA) 108 (90 Base) MCG/ACT inhaler Inhale 2 puffs into the lungs every 6 (six) hours as needed for wheezing or shortness of breath. Patient not taking: Reported on 02/16/2017 10/11/15   Caren Griffins, MD  cyclobenzaprine (FLEXERIL) 10 MG tablet Take 1 tablet (10 mg total) by mouth 2 (two) times daily as needed for muscle spasms. Patient not taking: Reported on 02/16/2017 02/06/17   Ocie Cornfield T, PA-C  guaiFENesin-dextromethorphan (ROBITUSSIN DM) 100-10 MG/5ML syrup Take 5 mLs by mouth every 4 (four) hours as needed for cough. Patient not taking: Reported on 02/06/2017 10/11/15   Caren Griffins, MD  ibuprofen (ADVIL,MOTRIN) 600 MG tablet Take 1 tablet (600 mg total) by mouth 3 (three) times daily. Patient not taking: Reported on 02/16/2017 01/28/17   Caccavale, Sophia, PA-C  methocarbamol (ROBAXIN) 500 MG tablet Take 1 tablet (500 mg total) by mouth 2 (two) times daily as needed for muscle spasms. Patient not taking: Reported on 02/06/2017 01/28/17   Caccavale, Sophia, PA-C  nicotine (EQ NICOTINE) 21 mg/24hr patch Place 1 patch  (21 mg total) onto the skin daily. Patient not taking: Reported on 02/06/2017 10/11/15   Caren Griffins, MD  polyethylene glycol (MIRALAX / Floria Raveling) packet Take 17 g by mouth daily. Patient not taking: Reported on 02/16/2017 02/06/17   Doristine Devoid, PA-C    ALLERGY: Allergies  Allergen Reactions  . Penicillins Other (See Comments)    Has patient had a PCN reaction causing immediate rash, facial/tongue/throat swelling, SOB or lightheadedness with hypotension: Unknow Has patient had a PCN reaction causing severe rash involving mucus membranes or skin necrosis: Unknown Has patient had a PCN reaction that required hospitalization unknown Has patient had a PCN reaction occurring within the last 10 years: No If all of the above answers are "NO", then may proceed with Cephalosporin use.   [name] Penicillins Other (See Comments)    Social  History  Substance Use Topics  . Smoking status: Current Every Day Smoker    Packs/day: 0.50    Types: Cigarettes  . Smokeless tobacco: Never Used  . Alcohol use Yes     Comment: Daily. Heavy.      Family History  Problem Relation Age of Onset  . Hypertension Mother   . Cancer Other   . Stroke Other   . Coronary artery disease Other      ROS   Review of Systems  Constitutional: Negative for chills and fever.  HENT: Negative.   Eyes: Negative for blurred vision, double vision and photophobia.  Respiratory: Negative.   Cardiovascular: Negative.   Gastrointestinal: Negative for abdominal pain, diarrhea, heartburn, nausea and vomiting.  Genitourinary: Negative.   Musculoskeletal: Positive for back pain and myalgias. Negative for neck pain.  Neurological: Positive for weakness. Negative for dizziness, tingling, tremors, sensory change, speech change, focal weakness, seizures, loss of consciousness and headaches.    Exam   Vitals:   02/20/17 0530 02/20/17 0708  BP: (!) 181/117 (!) 163/107  Pulse: 72   Resp: 19   Temp: 97.6 F (36.4 C)    SpO2: 97%     General appearance: WDWN, NAD, Nontoxic Eyes: PERRL, Fundoscopic: normal Cardiovascular: Regular rate and rhythm without murmurs, rubs, gallops. No edema or variciosities. Distal pulses normal. Pulmonary: Clear to auscultation Musculoskeletal:     Muscle tone upper extremities: Normal    Muscle tone lower extremities: Normal    Motor exam: Upper Extremities Deltoid Bicep Tricep Grip  Right 5/5 5/5 5/5 5/5  Left 5/5 5/5 5/5 5/5   Lower Extremity IP Quad PF DF EHL  Right 5/5 5/5 5/5 5/5 5/5  Left 5/5 5/5 5/5 5/5 5/5   Neurological Awake, alert, oriented Memory and concentration grossly intact Speech fluent, appropriate CNII: Visual fields normal CNIII/IV/VI: EOMI CNV: Facial sensation normal CNVII: Symmetric, normal strength CNVIII: Grossly normal CNIX: Normal palate movement CNXI: Trap and SCM strength normal CN XII: Tongue protrusion normal Sensation grossly intact to LT DTR: Normal Coordination (finger/nose & heel/shin): Normal  Results - Imaging/Labs   Results for orders placed or performed during the hospital encounter of 02/16/17 (from the past 48 hour(s))  Basic metabolic panel     Status: Abnormal   Collection Time: 02/19/17 10:11 AM  Result Value Ref Range   Sodium 135 135 - 145 mmol/L   Potassium 3.8 3.5 - 5.1 mmol/L   Chloride 96 (L) 101 - 111 mmol/L   CO2 33 (H) 22 - 32 mmol/L   Glucose, Bld 141 (H) 65 - 99 mg/dL   BUN 6 6 - 20 mg/dL   Creatinine, Ser 0.74 0.61 - 1.24 mg/dL   Calcium 8.7 (L) 8.9 - 10.3 mg/dL   GFR calc non Af Amer >60 >60 mL/min   GFR calc Af Amer >60 >60 mL/min    Comment: (NOTE) The eGFR has been calculated using the CKD EPI equation. This calculation has not been validated in all clinical situations. eGFR's persistently <60 mL/min signify possible Chronic Kidney Disease.    Anion gap 6 5 - 15    Mr Thoracic Spine W Wo Contrast  Result Date: 02/18/2017 CLINICAL DATA:  T11-T12 osteomyelitis discitis. EXAM: MRI  THORACIC WITHOUT AND WITH CONTRAST TECHNIQUE: Multiplanar and multiecho pulse sequences of the thoracic spine were obtained without and with intravenous contrast. CONTRAST:  20 cc MultiHance intravenous contrast. COMPARISON:  MRI lumbar spine dated February 16, 2017. FINDINGS: MRI THORACIC SPINE FINDINGS Alignment:  Normal. Vertebrae: Heterogeneous marrow signal with abnormal decreased T1 marrow signal and enhancement of the T11 and T12 vertebral bodies. There is increased T2 signal and enhancement of the T11-T12 intervertebral disc. Findings are consistent with osteomyelitis-discitis. Cord:  Normal signal and morphology. Paraspinal and other soft tissues: Paraspinal inflammation and enhancement at T11-T12. Anterior epidural phlegmon without discrete abscess. Probable sebaceous cyst along the left upper back. 3.6 cm left renal cyst. Disc levels: Scattered degenerative disc disease. A disc bulge at C6-C7 seen only on the sagittal images results in moderate to severe central spinal canal stenosis. Disc bulges from T5-6 through T12-L1 with mild spinal canal narrowing at T10-T11 and T11-T12. No significant neuroforaminal stenosis at any level. IMPRESSION: 1. Osteomyelitis-discitis at T11-T12 with mild anterior epidural phlegmon. No discrete epidural abscess. 2. Scattered degenerative disc disease throughout the thoracic spine with mild spinal canal narrowing at T10-T11 and T11-T12. Electronically Signed   By: Titus Dubin M.D.   On: 02/18/2017 12:59    Impression/Plan   51 y.o. 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  There is no cord compression or stenosis.   He is neurologically intact and without paresthesias.   There is no neurosurgical intervention indicated. This can be managed conservatively with antibiotics per ID.   He can follow with [place] in 4 weeks outpatient for repeat imaging to monitor for vertebral collapse/spinal impingement.

## 2017-02-20 NOTE — Care Management Note (Signed)
Case Management Note  Patient Details  Name: [name] MRN: [id number] Date of Birth: Dec 14, 1965  Subjective/Objective:    51 yr old male admitted with chronic back pain, has fungal osteo of Thorasic Spine.               Action/Plan: Case manager contacted [facility] to arrange appointment, they are booked for several weeks. Case manager contacted [facility] [phone], scheduled patient followup appointment for Monday, March 11, 2017 at 2:30pm. Case manager will provide [facility] letter when patient ready for discharge.   Expected Discharge Date:  02/22/17             Expected Discharge Plan:   (Patient is homeless, lives in truck)  In-House Referral:  Clinical Social Work  Discharge planning Services  [place], [facility], [facility]  Post Acute Care Choice:  NA Choice offered to:  NA  DME Arranged:  N/A DME Agency:  NA  HH Arranged:  NA HH Agency:  NA  Status of Service:  In process, will continue to follow  If discussed at Long Length of Stay Meetings, dates discussed:    Additional Comments:  Ninfa Meeker, RN 02/20/2017, 2:27 PM

## 2017-02-20 NOTE — Progress Notes (Signed)
    [facility] has been requested to perform a transesophageal echocardiogram on [facility] for bacteremia.  After careful review of history and examination, the risks and benefits of transesophageal echocardiogram have been explained including risks of esophageal damage, perforation (1:10,000 risk), bleeding, pharyngeal hematoma as well as other potential complications associated with conscious sedation including aspiration, arrhythmia, respiratory failure and death. Alternatives to treatment were discussed, questions were answered. Patient is willing to proceed.   Pt is scheduled for TEE tomorrow with Dr. Stanford Breed. NPO at [place].  Tami Lin Tarin Johndrow, [place]  02/20/2017 2:40 PM

## 2017-02-20 NOTE — Progress Notes (Signed)
Family Medicine Teaching Service Daily Progress Note Intern Pager: [phone]  Patient name: [name] Medical record number: [id number] Date of birth: 24-Aug-1965 Age: 51 y.o. Gender: male  Primary Care Provider: Patient, No Pcp Per Consultants: ID, IR Code Status: FULL  Pt Overview and Major Events to Date:  Admitted 9/29 for   Assessment and Plan: Randall Ransome Johnsonis a 51 y.o.malepresenting with back pain. PMH is significant for substance abuse (cocaine, heroin). Also with reported prior diagnosis of COPD, CHF, and pulm HTN.   Chronic Back Pain: Likely 2/2 osteomyelitis and discitis as shown on MRI lumbar spine. Also shows phlegmon.  Patient states for the past 3 weeks he has had increased back pain. He does endorse IV heroin use. No leukocytosis. LA < 2.0 x 2. ESR 35, CRP 1.5. No murmur on exam to suggest seeding.   -eraxis d/c'd -Day 2 fluconazole 800 (will decrease to 400 later and will need 36month to 1 yr treatment per ID) - Vitals per floor - Trend CBC, BMP - dilaudid 22mIV q6h prn for severe pain; - IR sampled disc fluid, candida identified -neurosurgery signed off  Hx of Polysubstance Abuse:Patient endorsed using cocaine to myself and history of marijuana. Endorsed IV drug use to ED provider. UDS positive for cocaine and opiates. HIV screen NR. - Hepatitis panel; HCV Ab >11.0  -Dr. MuDaryll Drownonsulted for suboxone, will re-evaluate Thursday.   Patient denies memory of this conversation and is concerned about suboxone cost but he said he was interested  Hx of Tobacco Use Disorder.Patient has long history of tobacco use. Currently smokes  - Provided counseling on quitting and health benefits of stopping smoking. - 1475micotine patch  Hx of Alcohol Use Disorder:Patient has history of heavy alcohol intake. CIWA 15,11,1,1 - CIWA protocol had expired, reordered at 6prn  HTN: Suspect 2/2 pain - prn hydralazine 5 mg q4h for SBP>180,  DBP>110   CHFVJD:YNXGZFPates he was diagnosed with CHF in prison recently. He states he is not currently taking any medications and was never started on any. He is not having any active signs or symptoms of CHF exacerbation at this time. - TTEcho ordered through [employer] -TEE scheduled tentatively for Thursday due to the backlog in the their OR  FEN/GI: Regular diet Prophylaxis: SCDs  Disposition: inpatient pending plan for antibiotics  Subjective:  Still in significant expressed pain.   Was wanting his medication 71m49marly and did not want to talk about anything but his pain plan.  Objective: Temp:  [97.6 F (36.4 C)-99.1 F (37.3 C)] 97.6 F (36.4 C) (10/03 0530) Pulse Rate:  [65-72] 72 (10/03 0530) Resp:  [19] 19 (10/03 0530) BP: (157-181)/(72-117) 181/117 (10/03 0530) SpO2:  [95 %-97 %] 97 % (10/03 0530) Weight:  [222 lb 10.6 oz (101 kg)] 222 lb 10.6 oz (101 kg) (10/03 0300) Physical Exam: General: Obese male, uncomforably turning in bed once awakened Cardiovascular: RRR, S1S2, no mrg Respiratory: CTAB, no wheezing noted Abdomen: no belly pain, obese, +BS, NT Neuro: Fully awake and conversational during exam with gross motor control intact Extremities: Moves all spontaneously  Laboratory:  Recent Labs Lab 02/16/17 1455 02/17/17 0452 02/18/17 0554  WBC 5.9 8.0 6.2  HGB 13.6 14.1 13.9  HCT 40.7 41.1 40.5  PLT 273 285 292    Recent Labs Lab 02/15/17 2358 02/16/17 1455 02/17/17 0452 02/18/17 0554 02/19/17 1011  NA 133* 137 133* 133* 135  K 3.1* 3.7 3.6 3.2* 3.8  CL 96* 104 99* 96*  96*  CO2 _0 33*  BUN <5* _1 CREATININE 0.86 0.84 0.86 0.76 0.74  CALCIUM 8.8* 8.7* 8.5* 8.5* 8.7*  PROT 8.2* 7.4  --   --   --   BILITOT 0.8 0.7  --   --   --   ALKPHOS 53 50  --   --   --   ALT 53 47  --   --   --   AST 34 33  --   --   --   GLUCOSE 135* 112* 155* 181* 141*    CIWAs 12<11<0<0  Imaging/Diagnostic Tests: Dg Chest 2 View  Result  Date: 02/16/2017 CLINICAL DATA:  51 y/o M; shortness of breath with productive cough. EXAM: CHEST  2 VIEW COMPARISON:  02/06/2017 chest radiograph FINDINGS: The heart size and mediastinal contours are within normal limits. Bronchitic changes in the left infrahilar region. No focal consolidation. The visualized skeletal structures are unremarkable. IMPRESSION: Bronchitic changes in left infrahilar region. No focal consolidation. Electronically Signed   By: Kristine Garbe M.D.   On: 02/16/2017 00:18   Mr Lumbar Spine W Wo Contrast  Result Date: 02/16/2017 CLINICAL DATA:  Increasing back pain over the last 3 months. IV drug abuse. Cauda equina syndrome suspected. EXAM: MRI LUMBAR SPINE WITHOUT AND WITH CONTRAST TECHNIQUE: Multiplanar and multiecho pulse sequences of the lumbar spine were obtained without and with intravenous contrast. CONTRAST:  <20 ml > MULTIHANCE GADOBENATE DIMEGLUMINE 529 MG/ML IV SOLN COMPARISON:  Acute abdominal series done 02/06/2017. abdominopelvic CT 02/06/2017. FINDINGS: Segmentation: Conventional anatomy assumed, with the last open disc space designated L5-S1. This correlates with prior imaging. Alignment: Mild straightening. There are bilateral L5 pars defects with 5 mm of anterolisthesis at L5-S1. Vertebrae: The marrow signal is diffusely heterogeneous on T2 and T1 weighted images. As above, there are chronic L5 pars defects. There are changes of diskitis and osteomyelitis at T11-12 with narrowing, T2 hyperintensity and enhancement of the intervertebral disc. There is also bone marrow edema and enhancement within the T11 and T12 vertebral bodies. Of note, the most superior visualization on this lumbar spine study is at the superior aspect of the T11 vertebral body. There are no changes of discitis or osteomyelitis in the lumbar spine. The visualized sacroiliac joints appear unremarkable. Conus medullaris: Extends to the T12 level and appears normal. No abnormal intradural  enhancement or distal cord compression identified. Paraspinal and other soft tissues: There is paraspinal inflammation and enhancement at T11-12. No epidural fluid collection is identified. Disc levels: T11-12: As above, changes of diskitis and osteomyelitis with paraspinal inflammation. There is associated disc bulging with mild mass effect on the thecal sac. No foraminal compromise or nerve root encroachment seen. T12-L1: Disc and endplate degeneration with anterior osteophytes and mild disc bulging. This level was not imaged in the axial plane. No evidence of nerve root encroachment. No significant acquired findings from L1-2 through L3-4. L4-5: Disc height and hydration are maintained. Minimal disc bulging and mild facet hypertrophy. No spinal stenosis or nerve root encroachment. L5-S1: The disc is degenerated with annular bulging and uncovering related to the pars defects and resulting anterolisthesis. The spinal canal and lateral recesses are patent. There is moderate foraminal narrowing, right-greater-than-left, with possible bilateral L5 nerve root encroachment. IMPRESSION: 1. Findings are consistent with discitis and osteomyelitis at the T11-12 level. The superior extent of this may be incompletely visualized by this examination. 2. No conus compression or abnormal intradural enhancement identified. 3. Bilateral  L5 pars defects with chronic disc degeneration and grade 1 anterolisthesis L5-S1 contributing to moderate foraminal narrowing, right-greater-than-left. Electronically Signed   By: Richardean Sale M.D.   On: 02/16/2017 11:31   Ir Fluoro Guide Ndl Plmt / Bx  Result Date: 02/17/2017 INDICATION: Concern for discitis/osteomyelitis involving T11-T12. Please perform fluoroscopic guided disc aspiration for diagnostic purposes. EXAM: IR FLUORO GUIDE NEEDLE PLACEMENT /BIOPSY COMPARISON:  Lumbar spine MRI - 02/16/2017; CT abdomen and pelvis - 02/06/2017 MEDICATIONS: None ANESTHESIA/SEDATION: Moderate  (conscious) sedation was employed during this procedure. A total of Fentanyl 100 mcg was administered intravenously. Moderate Sedation Time: 10 minutes. The patient's level of consciousness and vital signs were monitored continuously by radiology nursing throughout the procedure under my direct supervision. CONTRAST:  None FLUOROSCOPY TIME:  2 minutes, 6 seconds (824.2 mGy) COMPLICATIONS: None immediate. PROCEDURE: Informed written consent was obtained from the patient after a discussion of the risks, benefits and alternatives to treatment. A timeout was performed prior to the initiation of the procedure. The patient was positioned prone on the fluoroscopy table. The T11-T12 intervertebral disc space was marked fluoroscopically. Utilizing an oblique, left-sided approach, a 20 gauge Francine needle was a advanced into the T11-T12 intervertebral disc. Appropriate positioning was confirmed with the fluoroscopic imaging in both the anterior and lateral projections. Multiple spot fluoroscopic images were saved for procedural documentation purposes. With the needle appropriate position, approximately 2 cc of slightly purulent appearing fluid was aspirated from the disc space. All fluid was capped and sent to the laboratory for analysis. The needle was removed and superficial hemostasis was achieved with manual compression. A dressing was placed. The patient tolerated the procedure well without immediate postprocedural complication. IMPRESSION: Technically successful fluoroscopic guided T11-T12 disc aspiration. Electronically Signed   By: Sandi Mariscal M.D.   On: 02/17/2017 12:55     Sherene Sires, DO 02/20/2017, 6:03 AM PGY-1, [place] Intern pager: [phone], text pages welcome

## 2017-02-20 NOTE — Progress Notes (Signed)
Subjective:  Wants to smoke outside and also wants IV pain meds  Antibiotics:  Anti-infectives    Start     Dose/Rate Route Frequency Ordered Stop   02/21/17 1000  fluconazole (DIFLUCAN) tablet 400 mg     400 mg Oral Daily 02/20/17 1130     02/19/17 1900  fluconazole (DIFLUCAN) tablet 800 mg  Status:  Discontinued     800 mg Oral Daily 02/19/17 1857 02/20/17 1130   02/18/17 1700  anidulafungin (ERAXIS) 100 mg in sodium chloride 0.9 % 100 mL IVPB  Status:  Discontinued     100 mg 78 mL/hr over 100 Minutes Intravenous Every 24 hours 02/17/17 1728 02/19/17 1857   02/17/17 2200  vancomycin (VANCOCIN) IVPB 1000 mg/200 mL premix  Status:  Discontinued     1,000 mg 200 mL/hr over 60 Minutes Intravenous Every 8 hours 02/17/17 2147 02/18/17 0950   02/17/17 2200  ceFEPIme (MAXIPIME) 2 g in dextrose 5 % 50 mL IVPB  Status:  Discontinued     2 g 100 mL/hr over 30 Minutes Intravenous Every 8 hours 02/17/17 2147 02/18/17 1024   02/17/17 1730  anidulafungin (ERAXIS) 200 mg in sodium chloride 0.9 % 200 mL IVPB    Comments:  Please start 100mg  iVPB daily tomorrow   200 mg 78 mL/hr over 200 Minutes Intravenous  Once 02/17/17 1728 02/17/17 2347   02/17/17 0000  vancomycin (VANCOCIN) IVPB 1000 mg/200 mL premix  Status:  Discontinued     1,000 mg 200 mL/hr over 60 Minutes Intravenous Every 8 hours 02/16/17 1642 02/17/17 0917   02/16/17 1430  ceFEPIme (MAXIPIME) 2 g in dextrose 5 % 50 mL IVPB  Status:  Discontinued     2 g 100 mL/hr over 30 Minutes Intravenous Every 8 hours 02/16/17 1416 02/17/17 0917   02/16/17 1415  vancomycin (VANCOCIN) 2,000 mg in sodium chloride 0.9 % 500 mL IVPB     2,000 mg 250 mL/hr over 120 Minutes Intravenous  Once 02/16/17 1405 02/16/17 1805   02/16/17 1330  piperacillin-tazobactam (ZOSYN) IVPB 3.375 g  Status:  Discontinued     3.375 g 100 mL/hr over 30 Minutes Intravenous  Once 02/16/17 1332 02/16/17 1413      Medications: Scheduled Meds: . acetaminophen   650 mg Oral Q6H  . albuterol  5 mg Nebulization Once  . enoxaparin (LOVENOX) injection  40 mg Subcutaneous Q24H  . [START ON 02/21/2017] fluconazole  400 mg Oral Daily  . folic acid  1 mg Oral Daily  . ketorolac  30 mg Intramuscular Once  . lisinopril  20 mg Oral Daily  . LORazepam  0-4 mg Intravenous Q8H  . multivitamin with minerals  1 tablet Oral Daily  . nicotine  21 mg Transdermal Daily  . sodium chloride flush  3 mL Intravenous Q12H  . thiamine  100 mg Oral Daily   Continuous Infusions: PRN Meds:.albuterol, hydrALAZINE, oxyCODONE    Objective: Weight change: 0 lb (0 kg)  Intake/Output Summary (Last 24 hours) at 02/20/17 1856 Last data filed at 02/20/17 1700  Gross per 24 hour  Intake             1803 ml  Output                0 ml  Net             1803 ml   Blood pressure (!) 152/98, pulse 71, temperature 98.4 F (36.9 C), temperature source  Oral, resp. rate 19, height 5\' 8"  (1.727 m), weight 222 lb 10.6 oz (101 kg), SpO2 94 %. Temp:  [97.6 F (36.4 C)-98.7 F (37.1 C)] 98.4 F (36.9 C) (10/03 1500) Pulse Rate:  [70-72] 71 (10/03 1500) Resp:  [19] 19 (10/03 1500) BP: (152-181)/(72-117) 152/98 (10/03 1500) SpO2:  [94 %-97 %] 94 % (10/03 1500) Weight:  [222 lb 10.6 oz (101 kg)] 222 lb 10.6 oz (101 kg) (10/03 0300)  Physical Exam: General: Alert and awake, oriented x3 , animated Neuro: nonfocal  CBC:  CBC Latest Ref Rng & Units 02/18/2017 02/17/2017 02/16/2017  WBC 4.0 - 10.5 K/uL 6.2 8.0 5.9  Hemoglobin 13.0 - 17.0 g/dL 13.9 14.1 13.6  Hematocrit 39.0 - 52.0 % 40.5 41.1 40.7  Platelets 150 - 400 K/uL 292 285 273      BMET  Recent Labs  02/18/17 0554 02/19/17 1011  NA 133* 135  K 3.2* 3.8  CL 96* 96*  CO2 31 33*  GLUCOSE 181* 141*  BUN 8 6  CREATININE 0.76 0.74  CALCIUM 8.5* 8.7*     Liver Panel  No results for input(s): PROT, ALBUMIN, AST, ALT, ALKPHOS, BILITOT, BILIDIR, IBILI in the last 72 hours.     Sedimentation Rate No results for  input(s): ESRSEDRATE in the last 72 hours. C-Reactive Protein No results for input(s): CRP in the last 72 hours.  Micro Results: Recent Results (from the past 720 hour(s))  Urine C&S     Status: None   Collection Time: 02/06/17  5:50 PM  Result Value Ref Range Status   Specimen Description URINE, RANDOM  Final   Special Requests NONE  Final   Culture   Final    NO GROWTH Performed at [facility], [address] [place], [place] [address]    Report Status 02/08/2017 FINAL  Final  Anaerobic culture     Status: None (Preliminary result)   Collection Time: 02/17/17 12:33 PM  Result Value Ref Range Status   Specimen Description FLUID  Final   Special Requests [place] T11 12  Final   Culture   Final    NO ANAEROBES ISOLATED; CULTURE IN PROGRESS FOR 5 DAYS   Report Status PENDING  Incomplete  Aerobic Culture (superficial specimen)     Status: None   Collection Time: 02/17/17 12:34 PM  Result Value Ref Range Status   Specimen Description FLUID  Final   Special Requests DISC T11 12  Final   Gram Stain   Final    ABUNDANT WBC PRESENT, PREDOMINANTLY PMN RARE BUDDING YEAST SEEN    Culture FEW CANDIDA ALBICANS  Final   Report Status 02/19/2017 FINAL  Final  Culture, fungus without smear     Status: Abnormal (Preliminary result)   Collection Time: 02/17/17 12:34 PM  Result Value Ref Range Status   Specimen Description FLUID  Final   Special Requests DISC T11 12  Final   Culture CANDIDA ALBICANS (A)  Final   Report Status PENDING  Incomplete  Acid Fast Smear (AFB)     Status: None   Collection Time: 02/17/17 12:38 PM  Result Value Ref Range Status   AFB Specimen Processing Concentration  Final   Acid Fast Smear Negative  Final    Comment: (NOTE) Performed At: [facility] [place], [place] [id number] Lindon Romp MD JK:[id number]    Source (AFB) FLUID  Final    Comment: DISC T11 12  Culture, blood (Routine X 2) w Reflex  to ID Panel     Status:  None (Preliminary result)   Collection Time: 02/17/17  5:54 PM  Result Value Ref Range Status   Specimen Description BLOOD RIGHT ANTECUBITAL  Final   Special Requests   Final    BOTTLES DRAWN AEROBIC AND ANAEROBIC Blood Culture adequate volume   Culture NO GROWTH 3 DAYS  Final   Report Status PENDING  Incomplete    Studies/Results: No results found.    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 Kathyrn Sheriff evaluating patient.  We will change to oral fluconazole 400 mg daily   He should be treated for 6 months to a year  HE WILL NEED HELP FROM CASE MANAGEMENT WITH MECHANISM TO OBTAIN THIS ANTIFUNGAL   #2 IVDU: I GREATLY appreciate Dr. Doristine Section help with this patient and plan to institute suboxone.  #3 ? Endocarditis: he is to have TEE tomorrow  I will arrange HSFU for him in my clinic  I will followup results of his TEE and if it is without endocarditis I will sign off.    LOS: 4 days   Alcide Evener 02/20/2017, 6:56 PM

## 2017-02-20 NOTE — Progress Notes (Signed)
Paged by nurse due to patient saying he needed to leave and didn't want any more treatment.    Went immediately to speak with patient and his two friends were present, he gave permission to discuss his medical care in front of his friends.   We discussed that he wanted to leave to deal with "family stuff".   I expressed the life threatening concerns we were investigating with the TEE scheduled tommorow, the life threatening implications of not getting his fluconozole that were trying to arrange financial assistance for, and the life threatening implications of him returning to his street heroin addiction instead of connecting with the suboxone clinic we were tryng to get him assigned to.  He stated he wanted to go outside and smoke a ciggarrette but would come back.   I told him I did not think it was possible but that I would ask if there was a way to do that if he would stay for treatment.   He was already rpescrbied a nicotene patch and said he didn't like it.   Either way, we agreed that after the TEE we would try and see if he could be discharged as soon as possible.  I checked and found out there was not a method to allow the patient to leave the building and stay under our care.   I attempted to call him and let him know personally but the line was busy.   I called and notified his nurse that if he chose to leave the floor it was AMA.   It is not medically appropriate to discharge him at this time.  -Dr. Criss Rosales

## 2017-02-21 ENCOUNTER — Inpatient Hospital Stay (HOSPITAL_COMMUNITY): Payer: Self-pay | Admitting: Certified Registered"

## 2017-02-21 ENCOUNTER — Encounter (HOSPITAL_COMMUNITY)
Admission: EM | Disposition: A | Discharge status: Home / Self Care | Payer: Self-pay | Source: Home / Self Care | Attending: Family Medicine

## 2017-02-21 ENCOUNTER — Encounter (HOSPITAL_COMMUNITY): Payer: Self-pay | Admitting: Certified Registered"

## 2017-02-21 ENCOUNTER — Telehealth: Payer: Self-pay | Admitting: *Deleted

## 2017-02-21 ENCOUNTER — Inpatient Hospital Stay (HOSPITAL_COMMUNITY)

## 2017-02-21 DIAGNOSIS — I340 Nonrheumatic mitral (valve) insufficiency: Secondary | ICD-10-CM | POA: Diagnosis not present

## 2017-02-21 DIAGNOSIS — I330 Acute and subacute infective endocarditis: Secondary | ICD-10-CM

## 2017-02-21 DIAGNOSIS — R45851 Suicidal ideations: Secondary | ICD-10-CM

## 2017-02-21 DIAGNOSIS — B376 Candidal endocarditis: Secondary | ICD-10-CM

## 2017-02-21 DIAGNOSIS — R7881 Bacteremia: Secondary | ICD-10-CM

## 2017-02-21 HISTORY — PX: TEE WITHOUT CARDIOVERSION: SHX5443

## 2017-02-21 LAB — RAPID URINE DRUG SCREEN, HOSP PERFORMED
AMPHETAMINES: NOT DETECTED
BARBITURATES: NOT DETECTED
BENZODIAZEPINES: POSITIVE — AB
COCAINE: NOT DETECTED
Opiates: POSITIVE — AB
TETRAHYDROCANNABINOL: NOT DETECTED

## 2017-02-21 LAB — BASIC METABOLIC PANEL
Anion gap: 9 (ref 5–15)
BUN: 9 mg/dL (ref 6–20)
CHLORIDE: 96 mmol/L — AB (ref 101–111)
CO2: 30 mmol/L (ref 22–32)
Calcium: 9.1 mg/dL (ref 8.9–10.3)
Creatinine, Ser: 0.77 mg/dL (ref 0.61–1.24)
GFR calc Af Amer: >60 mL/min (ref >60)
GFR calc non Af Amer: >60 mL/min (ref >60)
GLUCOSE: 147 mg/dL — AB (ref 65–99)
POTASSIUM: 3.8 mmol/L (ref 3.5–5.1)
Sodium: 135 mmol/L (ref 135–145)

## 2017-02-21 LAB — CBC
HEMATOCRIT: 44 % (ref 39.0–52.0)
HEMOGLOBIN: 14.9 g/dL (ref 13.0–17.0)
MCH: 35.1 pg — AB (ref 26.0–34.0)
MCHC: 33.9 g/dL (ref 30.0–36.0)
MCV: 103.8 fL — AB (ref 78.0–100.0)
Platelets: 328 10*3/uL (ref 150–400)
RBC: 4.24 MIL/uL (ref 4.22–5.81)
RDW: 13.3 % (ref 11.5–15.5)
WBC: 6.9 10*3/uL (ref 4.0–10.5)

## 2017-02-21 LAB — PROTIME-INR
INR: 1.03
PROTHROMBIN TIME: 13.4 s (ref 11.4–15.2)

## 2017-02-21 SURGERY — ECHOCARDIOGRAM, TRANSESOPHAGEAL
Anesthesia: Monitor Anesthesia Care

## 2017-02-21 MED ORDER — BUTAMBEN-TETRACAINE-BENZOCAINE 2-2-14 % EX AERO
INHALATION_SPRAY | CUTANEOUS | Status: DC | PRN
  Administered 2017-02-21: 2 via TOPICAL

## 2017-02-21 MED ORDER — OXYCODONE HCL 5 MG PO TABS: 5.0000 mg | ORAL_TABLET | Freq: Four times a day (QID) | ORAL | 0 refills | Status: DC | PRN

## 2017-02-21 MED ORDER — FENTANYL CITRATE (PF) 100 MCG/2ML IJ SOLN: 25.0000 ug | INTRAMUSCULAR | Status: DC | PRN

## 2017-02-21 MED ORDER — FLUCONAZOLE 200 MG PO TABS: 800.0000 mg | ORAL_TABLET | Freq: Every day | ORAL | 5 refills | Status: DC

## 2017-02-21 MED ORDER — ADULT MULTIVITAMIN W/MINERALS CH: 1.0000 | ORAL_TABLET | Freq: Every day | ORAL | 0 refills | Status: DC

## 2017-02-21 MED ORDER — GLYCOPYRROLATE 0.2 MG/ML IJ SOLN
INTRAMUSCULAR | Status: DC | PRN
  Administered 2017-02-21: 0.1 mg via INTRAVENOUS

## 2017-02-21 MED ORDER — THIAMINE HCL 100 MG PO TABS: 100.0000 mg | ORAL_TABLET | Freq: Every day | ORAL | 0 refills | Status: DC

## 2017-02-21 MED ORDER — SODIUM CHLORIDE 0.9 % IV SOLN
INTRAVENOUS | Status: DC | PRN
  Administered 2017-02-21: 08:00:00 via INTRAVENOUS

## 2017-02-21 MED ORDER — OXYCODONE HCL 5 MG PO TABS
10.0000 mg | ORAL_TABLET | Freq: Once | ORAL | Status: AC | PRN
  Administered 2017-02-21: 10 mg via ORAL
  Filled 2017-02-21 (×2): qty 2

## 2017-02-21 MED ORDER — FENTANYL CITRATE (PF) 100 MCG/2ML IJ SOLN
INTRAMUSCULAR | Status: DC | PRN
  Administered 2017-02-21: 50 ug via INTRAVENOUS

## 2017-02-21 MED ORDER — FLUCONAZOLE 200 MG PO TABS
800.0000 mg | ORAL_TABLET | Freq: Every day | ORAL | Status: DC
  Administered 2017-02-21: 800 mg via ORAL
  Filled 2017-02-21: qty 4

## 2017-02-21 MED ORDER — FOLIC ACID 1 MG PO TABS: 1.0000 mg | ORAL_TABLET | Freq: Every day | ORAL | 1 refills | Status: DC

## 2017-02-21 MED ORDER — LIDOCAINE 2% (20 MG/ML) 5 ML SYRINGE
INTRAMUSCULAR | Status: DC | PRN
  Administered 2017-02-21: 50 mg via INTRAVENOUS

## 2017-02-21 MED ORDER — ACETAMINOPHEN 325 MG PO TABS: 325.0000 mg | ORAL_TABLET | Freq: Four times a day (QID) | ORAL | 0 refills | Status: DC | PRN

## 2017-02-21 MED ORDER — PROPOFOL 10 MG/ML IV BOLUS
INTRAVENOUS | Status: DC | PRN
  Administered 2017-02-21: 20 mg via INTRAVENOUS
  Administered 2017-02-21: 80 mg via INTRAVENOUS
  Administered 2017-02-21 (×2): 20 mg via INTRAVENOUS

## 2017-02-21 MED ORDER — ONDANSETRON HCL 4 MG/2ML IJ SOLN: 4.0000 mg | Freq: Once | INTRAMUSCULAR | Status: DC | PRN

## 2017-02-21 MED ORDER — SODIUM CHLORIDE 0.9 % IV SOLN
INTRAVENOUS | Status: DC
  Administered 2017-02-21: 20 mL/h via INTRAVENOUS

## 2017-02-21 MED ORDER — BUPRENORPHINE HCL-NALOXONE HCL 8-2 MG SL SUBL
1.0000 | SUBLINGUAL_TABLET | Freq: Every day | SUBLINGUAL | Status: DC
  Administered 2017-02-21: 1 via SUBLINGUAL
  Filled 2017-02-21: qty 2

## 2017-02-21 MED ORDER — OXYCODONE HCL 5 MG/5ML PO SOLN: 5.0000 mg | Freq: Once | ORAL | Status: DC | PRN

## 2017-02-21 MED ORDER — MIDAZOLAM HCL 5 MG/5ML IJ SOLN
INTRAMUSCULAR | Status: DC | PRN
  Administered 2017-02-21: 1 mg via INTRAVENOUS

## 2017-02-21 MED ORDER — LISINOPRIL 20 MG PO TABS: 20.0000 mg | ORAL_TABLET | Freq: Every day | ORAL | 2 refills | Status: DC

## 2017-02-21 MED ORDER — EPHEDRINE SULFATE-NACL 50-0.9 MG/10ML-% IV SOSY
PREFILLED_SYRINGE | INTRAVENOUS | Status: DC | PRN
  Administered 2017-02-21 (×2): 5 mg via INTRAVENOUS

## 2017-02-21 MED ORDER — CYCLOBENZAPRINE HCL 10 MG PO TABS: 10.0000 mg | ORAL_TABLET | Freq: Three times a day (TID) | ORAL | 0 refills | Status: DC

## 2017-02-21 MED ORDER — OXYCODONE HCL 5 MG PO TABS
5.0000 mg | ORAL_TABLET | Freq: Once | ORAL | Status: DC | PRN
  Filled 2017-02-21: qty 1

## 2017-02-21 MED ORDER — BUPRENORPHINE HCL-NALOXONE HCL 8-2 MG SL SUBL: 1.0000 | SUBLINGUAL_TABLET | Freq: Two times a day (BID) | SUBLINGUAL | 0 refills | Status: DC

## 2017-02-21 MED ORDER — OXYCODONE HCL 5 MG PO TABS
5.0000 mg | ORAL_TABLET | Freq: Four times a day (QID) | ORAL | Status: DC | PRN
Start: 2017-02-21 — End: 2017-02-21
  Administered 2017-02-21: 5 mg via ORAL

## 2017-02-21 MED FILL — FLUCONAZOLE 200 MG TABLET: 200 | 30 days supply | Qty: 120 | Fill #0

## 2017-02-21 MED FILL — LISINOPRIL 20 MG TABS: 20 | 30 days supply | Qty: 30 | Fill #0

## 2017-02-21 NOTE — Care Management (Signed)
Case manager picked up prescriptions at [facility], delivered to patient's room, informed bedside RN that patient has medications for discharge, he will be given script for his pain medications.

## 2017-02-21 NOTE — Discharge Instructions (Signed)

## 2017-02-21 NOTE — Progress Notes (Signed)
Dr. Criss Rosales, Dr. Drucilla Schmidt, Dr. Daryll Drown in to see pt this pm to discuss discharge plan of care, med compliance, follow up care.    Patient has required constant watch this entire shift, to ensure that he complies with remaining on the unit.   Patient has not been happy with bed alarm on bed but necessary due to unsteady gait.    At times patient has refused bed alarm.  Patient has consumed inordinate amount of time this shift by debating his care, his inability to go off the unit, his argumentative frame, cursing facility.  Scriber explained to patient that is it my responsibility to maintain his safety while inpatient.   Will continue to monitor patient for compliance and safety.

## 2017-02-21 NOTE — Progress Notes (Signed)
IV access removed by patient. Unable to give IV Ativan. Patient continues to threaten to leave floor, and continues to leave room. Nurse has educated patient on risks. Will continue to monitor

## 2017-02-21 NOTE — Progress Notes (Signed)
Patient provided discharge instructions, verbally understood instructions. IV discontinued. Pt discharged to home with family member.

## 2017-02-21 NOTE — H&P (View-Only) (Signed)
Subjective:  Wants to smoke outside and also wants IV pain meds  Antibiotics:  Anti-infectives    Start     Dose/Rate Route Frequency Ordered Stop   02/21/17 1000  fluconazole (DIFLUCAN) tablet 400 mg     400 mg Oral Daily 02/20/17 1130     02/19/17 1900  fluconazole (DIFLUCAN) tablet 800 mg  Status:  Discontinued     800 mg Oral Daily 02/19/17 1857 02/20/17 1130   02/18/17 1700  anidulafungin (ERAXIS) 100 mg in sodium chloride 0.9 % 100 mL IVPB  Status:  Discontinued     100 mg 78 mL/hr over 100 Minutes Intravenous Every 24 hours 02/17/17 1728 02/19/17 1857   02/17/17 2200  vancomycin (VANCOCIN) IVPB 1000 mg/200 mL premix  Status:  Discontinued     1,000 mg 200 mL/hr over 60 Minutes Intravenous Every 8 hours 02/17/17 2147 02/18/17 0950   02/17/17 2200  ceFEPIme (MAXIPIME) 2 g in dextrose 5 % 50 mL IVPB  Status:  Discontinued     2 g 100 mL/hr over 30 Minutes Intravenous Every 8 hours 02/17/17 2147 02/18/17 1024   02/17/17 1730  anidulafungin (ERAXIS) 200 mg in sodium chloride 0.9 % 200 mL IVPB    Comments:  Please start 100mg  iVPB daily tomorrow   200 mg 78 mL/hr over 200 Minutes Intravenous  Once 02/17/17 1728 02/17/17 2347   02/17/17 0000  vancomycin (VANCOCIN) IVPB 1000 mg/200 mL premix  Status:  Discontinued     1,000 mg 200 mL/hr over 60 Minutes Intravenous Every 8 hours 02/16/17 1642 02/17/17 0917   02/16/17 1430  ceFEPIme (MAXIPIME) 2 g in dextrose 5 % 50 mL IVPB  Status:  Discontinued     2 g 100 mL/hr over 30 Minutes Intravenous Every 8 hours 02/16/17 1416 02/17/17 0917   02/16/17 1415  vancomycin (VANCOCIN) 2,000 mg in sodium chloride 0.9 % 500 mL IVPB     2,000 mg 250 mL/hr over 120 Minutes Intravenous  Once 02/16/17 1405 02/16/17 1805   02/16/17 1330  piperacillin-tazobactam (ZOSYN) IVPB 3.375 g  Status:  Discontinued     3.375 g 100 mL/hr over 30 Minutes Intravenous  Once 02/16/17 1332 02/16/17 1413      Medications: Scheduled Meds: . acetaminophen   650 mg Oral Q6H  . albuterol  5 mg Nebulization Once  . enoxaparin (LOVENOX) injection  40 mg Subcutaneous Q24H  . [START ON 02/21/2017] fluconazole  400 mg Oral Daily  . folic acid  1 mg Oral Daily  . ketorolac  30 mg Intramuscular Once  . lisinopril  20 mg Oral Daily  . LORazepam  0-4 mg Intravenous Q8H  . multivitamin with minerals  1 tablet Oral Daily  . nicotine  21 mg Transdermal Daily  . sodium chloride flush  3 mL Intravenous Q12H  . thiamine  100 mg Oral Daily   Continuous Infusions: PRN Meds:.albuterol, hydrALAZINE, oxyCODONE    Objective: Weight change: 0 lb (0 kg)  Intake/Output Summary (Last 24 hours) at 02/20/17 1856 Last data filed at 02/20/17 1700  Gross per 24 hour  Intake             1803 ml  Output                0 ml  Net             1803 ml   Blood pressure (!) 152/98, pulse 71, temperature 98.4 F (36.9 C), temperature source  Oral, resp. rate 19, height 5\' 8"  (1.727 m), weight 222 lb 10.6 oz (101 kg), SpO2 94 %. Temp:  [97.6 F (36.4 C)-98.7 F (37.1 C)] 98.4 F (36.9 C) (10/03 1500) Pulse Rate:  [70-72] 71 (10/03 1500) Resp:  [19] 19 (10/03 1500) BP: (152-181)/(72-117) 152/98 (10/03 1500) SpO2:  [94 %-97 %] 94 % (10/03 1500) Weight:  [222 lb 10.6 oz (101 kg)] 222 lb 10.6 oz (101 kg) (10/03 0300)  Physical Exam: General: Alert and awake, oriented x3 , animated Neuro: nonfocal  CBC:  CBC Latest Ref Rng & Units 02/18/2017 02/17/2017 02/16/2017  WBC 4.0 - 10.5 K/uL 6.2 8.0 5.9  Hemoglobin 13.0 - 17.0 g/dL 13.9 14.1 13.6  Hematocrit 39.0 - 52.0 % 40.5 41.1 40.7  Platelets 150 - 400 K/uL 292 285 273      BMET  Recent Labs  02/18/17 0554 02/19/17 1011  NA 133* 135  K 3.2* 3.8  CL 96* 96*  CO2 31 33*  GLUCOSE 181* 141*  BUN 8 6  CREATININE 0.76 0.74  CALCIUM 8.5* 8.7*     Liver Panel  No results for input(s): PROT, ALBUMIN, AST, ALT, ALKPHOS, BILITOT, BILIDIR, IBILI in the last 72 hours.     Sedimentation Rate No results for  input(s): ESRSEDRATE in the last 72 hours. C-Reactive Protein No results for input(s): CRP in the last 72 hours.  Micro Results: Recent Results (from the past 720 hour(s))  Urine C&S     Status: None   Collection Time: 02/06/17  5:50 PM  Result Value Ref Range Status   Specimen Description URINE, RANDOM  Final   Special Requests NONE  Final   Culture   Final    NO GROWTH Performed at [facility], [address], [place], [place] [address]    Report Status 02/08/2017 FINAL  Final  Anaerobic culture     Status: None (Preliminary result)   Collection Time: 02/17/17 12:33 PM  Result Value Ref Range Status   Specimen Description FLUID  Final   Special Requests [place] T11 12  Final   Culture   Final    NO ANAEROBES ISOLATED; CULTURE IN PROGRESS FOR 5 DAYS   Report Status PENDING  Incomplete  Aerobic Culture (superficial specimen)     Status: None   Collection Time: 02/17/17 12:34 PM  Result Value Ref Range Status   Specimen Description FLUID  Final   Special Requests DISC T11 12  Final   Gram Stain   Final    ABUNDANT WBC PRESENT, PREDOMINANTLY PMN RARE BUDDING YEAST SEEN    Culture FEW CANDIDA ALBICANS  Final   Report Status 02/19/2017 FINAL  Final  Culture, fungus without smear     Status: Abnormal (Preliminary result)   Collection Time: 02/17/17 12:34 PM  Result Value Ref Range Status   Specimen Description FLUID  Final   Special Requests DISC T11 12  Final   Culture CANDIDA ALBICANS (A)  Final   Report Status PENDING  Incomplete  Acid Fast Smear (AFB)     Status: None   Collection Time: 02/17/17 12:38 PM  Result Value Ref Range Status   AFB Specimen Processing Concentration  Final   Acid Fast Smear Negative  Final    Comment: (NOTE) Performed At: [facility] [employer], [place] [id number] Lindon Romp MD WN:[id number]    Source (AFB) FLUID  Final    Comment: DISC T11 12  Culture, blood (Routine X 2) w Reflex  to ID Panel     Status:  None (Preliminary result)   Collection Time: 02/17/17  5:54 PM  Result Value Ref Range Status   Specimen Description BLOOD RIGHT ANTECUBITAL  Final   Special Requests   Final    BOTTLES DRAWN AEROBIC AND ANAEROBIC Blood Culture adequate volume   Culture NO GROWTH 3 DAYS  Final   Report Status PENDING  Incomplete    Studies/Results: No results found.    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 Kathyrn Sheriff evaluating patient.  We will change to oral fluconazole 400 mg daily   He should be treated for 6 months to a year  HE WILL NEED HELP FROM CASE MANAGEMENT WITH MECHANISM TO OBTAIN THIS ANTIFUNGAL   #2 IVDU: I GREATLY appreciate Dr. Doristine Section help with this patient and plan to institute suboxone.  #3 ? Endocarditis: he is to have TEE tomorrow  I will arrange HSFU for him in my clinic  I will followup results of his TEE and if it is without endocarditis I will sign off.    LOS: 4 days   [name] 02/20/2017, 6:56 PM

## 2017-02-21 NOTE — Transfer of Care (Signed)
Immediate Anesthesia Transfer of Care Note  Patient: [name]  Procedure(s) Performed: TRANSESOPHAGEAL ECHOCARDIOGRAM (TEE) WITH MAC (N/A )  Patient Location: Endoscopy Unit  Anesthesia Type:MAC  Level of Consciousness: sedated and drowsy  Airway & Oxygen Therapy: Patient Spontanous Breathing and Patient connected to nasal cannula oxygen  Post-op Assessment: Report given to RN and Post -op Vital signs reviewed and stable  Post vital signs: Reviewed and stable  Last Vitals:  Vitals:   02/21/17 0530 02/21/17 0849  BP: (!) 170/131 (!) 182/126  Pulse: 77   Resp:  15  Temp: 36.8 C 36.8 C  SpO2: 93% 94%    Last Pain:  Vitals:   02/21/17 0849  TempSrc: Oral  PainSc: 10-Worst pain ever      Patients Stated Pain Goal: 5 (34/19/62 2297)  Complications: No apparent anesthesia complications

## 2017-02-21 NOTE — Progress Notes (Signed)
Internal Medicine Clinic Attending  I saw [name] today in evaluation for suboxone therapy for OUD.  I reassessed him for opioid use disorder.  He was somewhat somnolent at last visit and some of his answers were different today.  He notes that he has been using heroin for "years."  He started with pills.  He uses about 2 bags of heroin per day and is a daily user. This is his first complication related to heroin use and it has scared him.  He does occasional cocaine which he also injects.  He has tried to quit on his own without good results and had significant withdrawal in the past.  He has never tried medication assistance therapy with buprenorphine products or methadone.  He has never bought buprenorphine on the street to treat withdrawal.  He is currently homeless, living in hotels or in his truck.  He reported that all of his family was dead, but then told me later that his mother was living so I am not clear on his family support at this time.  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   He meets criteria for OUD as he has used more to get the same effect, used to stop withdrawal (dependence/tolerance), found it difficult to stop and been in dangerous situations to obtain opiates.  He has also had significant consequences including being homeless and now this medical illness.  He does report significant anxiety about missing doses and going through withdrawal.    Today, he was resting in bed in NAD.  He was somewhat sleepy, but noted that he was actively withdrawing.  He was reporting pain and body aches all over, nausea and GI upset, increasing anxiety about not having narcotics and runny nose.  His pulse was 70.  He was having some wheezing on exam and may benefit from a breathing treatment.  When I assessed his clinical withdrawal scale he scored a 7 which is mild withdrawal.  I have a feeling in the next couple of hours these symptoms will worsen.  It has only been about  4.5 hours since his last dose of short acting oxycodone, so it would not be appropriate to give him buprenorphine now.    I discussed his treatment plan with Dr. Criss Bennett, his main doctor on the family medicine teaching service.  Our plan that we discussed was the following - -  Will provide [name] with 1 dose of buprenorphine 8-2mg  at 6pm.  As he already scored a 7 on the COWS at 345pm, I would assume he would be > 10 at 6pm and should tolerate buprenorphine well.  I will provide him with a 7 day supply of buprenorphine-naloxone tablets with the plan to take 8-2mg  BID for the next 7 days.  I advised him to abstain from all illicit substances.  I discussed pain management with Dr. Criss Bennett; buprenorphine should provide some pain relief.  Other medications for pain from withdrawal and the pain from his back infection could include NSAIDs and possibly muscle relaxers.  I will leave that decision to the discretion of his primary team.  I have provided him with instructions and contact information for our clinic and we went over how to take the sublingual formulation of suboxone and he was able to teach back to me the appropriate method.  I also provided him with a clinic appointment in our clinic (He will be here for OUD therapy only, not as a PCP patient)  on 10/12 at 1030am.  He will be seen at that time by my partner Dr. Evette Bennett.    This plan was discussed in detail with [name].  He was not thrilled to hear he would not be getting short acting narcotics, but he repeated on multiple occasions that he has had a "wake up call" to get his life together and he is very willing to try therapy with suboxone to help with his opiate use disorder.    He did not have an accurate phone number to give me at this time so that I can contact him.  My nurse, [name], is calling him today to see if she can obtain one prior to him leaving the hospital.    Randall Chiquito, MD

## 2017-02-21 NOTE — Progress Notes (Signed)
FPTS Interim Progress Note  S:Paged by nursing to come speak to patient who was upset.   When I arrived on the floor, CM and ID was also in the room attempting to arrange discharge for the patient.   ID noted the TEE showed endocarditis and was increasing his fluconazole from 400mg  to 800mg  daily.   CM had arranged with community health to assist with lisinopril and fluconzazole meds.   There was not going to be assistance available for opioid pain medications.  He did make a comment to the ID physician that he would like to buy a gun and shoot himself when he was told that he would not be prescribed a higher dose of medications.  When questioned about the seriously of this by Dr. Drucilla Schmidt he immediately said it was a joke.   He confirmed with me later that it was a joke and said he loved his life, he was just venting because he wanted more pain medication.  He flatly denied any thoughts of hurting himself or anyone else.  O: BP (!) 115/93   Pulse 84   Temp 98.2 F (36.8 C) (Oral)   Resp (!) 29   Ht 5\' 8"  (1.727 m)   Wt 222 lb (100.7 kg)   SpO2 93%   BMI 33.75 kg/m     A/P: Patient agreed it was healthiest decision to try and wait a few hours to see if suboxone physician could come by as they were interested.  If the suboxone clinic physician was unable to come today the plan was to discuss discharge early evening. They understood the plan to give q6 ocy IR to bridge until his followup appt on Monday in the [facility] clinic.    Sherene Sires, DO 02/21/2017, 3:23 PM PGY-1, [facility] pager [phone]

## 2017-02-21 NOTE — Progress Notes (Signed)
Patient was given the 1 time dose of OXi IR and complained that the medication was not working. Patient stated that he wants to speak with someone higher up the chain because he could be in this amount of pain in his truck. I informed him that MD came and spoke with him and doctors will round in the am.He asked if he would get more pain medication I informed him that I can not assume what the MD may order but MD is aware. Arthor Captain LPN

## 2017-02-21 NOTE — Progress Notes (Signed)
Pt dressed in street clothes and walking down hall behind two visitors. Pt exited unit doors. Patient observed outside of unit heading to the [place] wing.  Called out to patient to return to unit. Explained to patient that he could not exit the unit, must remain on the unit as he is listed as an inpatient. Explained to patient that he can't leave unit to smoke. Patient states he is going to his truck to check on 5,000 $ worth of tools to make sure they are safe.  Explained to patient that security monitors the parking garage.  Patient loud and appears angry , in that he is rasing his voice wanting to know why the rules have changed. Explained to patient that the rules have always been the same. As it was explained to him yesterday, he can't leave the unit. And this campus is a non-smoking facility. Pt appearance is "sleepy looking". HE has rec'd only 5 mg of Oxy IR at this point iln the shift.   Pt at front desk wanting to know why he cant go to this truck, engaging in conversation with another RN and the charge nurse. Charge nurse explained the same thing that he can either stay and receive care or if he leaves unit he will be AMA.   During all of this wavering, patient is loud and argumentative. Patient asked to return to his room as his verbage is loud and interfering with visitors.   Page out to Dr. Criss Rosales to see if patient is eligible for discharge home this PM or if he will come to unit this pm to discuss actions of patient.

## 2017-02-21 NOTE — Progress Notes (Signed)
Family Medicine Teaching Service Daily Progress Note Intern Pager: [phone]  Patient name: [name] Medical record number: [id number] Date of birth: [birth year] Age: 51 y.o. Gender: male  Primary Care Provider: Patient, No Pcp Per Consultants: ID, IR Code Status: FULL  Pt Overview and Major Events to Date:  Admitted 9/29 for back pain, found to thoracic 11/12 candida osteomyelitis/discitis/phlegmon.  10/3 threatened to leave AMA.  Assessment and Plan: [name] a 51 y.o.malepresenting with back pain. PMH is significant for substance abuse (cocaine, heroin). Also with reported prior diagnosis of COPD, CHF, and pulm HTN.   Thoracic osteomyelitis/discitis/plegmon: Likely 2/2 osteomyelitis and discitis as shown on MRI lumbar spine. Also shows phlegmon.  Patient states for the past 3 weeks he has had increased back pain. He does endorse IV heroin use. No leukocytosis. LA < 2.0 x 2. ESR 35, CRP 1.5. No murmur on exam to suggest seeding.  Candida identified on culture -eraxis d/c'd -Day 3 fluconazole now decreased to 400 from 800 (will need 51month to 1 yr treatment per ID) - Vitals per floor - Trend CBC, BMP - roxy '5mg'$  q6  -neurosurgery signed off  Hx of Polysubstance Abuse:Patient endorsed using cocaine to myself and history of marijuana. Endorsed IV drug use to ED provider. UDS positive for cocaine and opiates. HIV screen NR. - Hepatitis panel; HCV Ab >11.0  -Dr. MDaryll Drownconsulted for suboxone, will re-evaluate Thursday.   Patient denies memory of this conversation and is concerned about suboxone cost but he said he was interested  Hx of Tobacco Use Disorder.Patient has long history of tobacco use. Currently smokes  - Provided counseling on quitting and health benefits of stopping smoking. - '14mg'$  Nicotine patch  Hx of Alcohol Use Disorder:Patient has history of heavy alcohol intake. CIWA 3,1,13,13 - CIWA protocol had expired, reordered at  8prn...discuss with team threshold for increasing to q6prn  HTN: Suspect 2/2 pain - prn hydralazine 5 mg q4h for SBP>180, DBP>110 -added lisinopril '20mg'$    COMV:EHMCNOBstates he was diagnosed with CHF in prison recently. He states he is not currently taking any medications and was never started on any. He is not having any active signs or symptoms of CHF exacerbation at this time.  TTE performed and no endocarditis identified.  -TEE scheduled tentatively for Thursday due to the backlog in the their OR  FEN/GI: Regular diet Prophylaxis: SCDs  Disposition: inpatient pending plan for antibiotics  Subjective:  Patient attempted to leave AMA yesterday afternoon and had pain complaints overnight.  Could not interview patient this AM because they were leaving to TEE  Objective: Temp:  [98.2 F (36.8 C)-98.4 F (36.9 C)] 98.2 F (36.8 C) (10/04 0530) Pulse Rate:  [70-77] 77 (10/04 0530) Resp:  [19-20] 20 (10/03 2042) BP: (152-205)/(98-131) 170/131 (10/04 0530) SpO2:  [90 %-94 %] 93 % (10/04 0530) Physical Exam:  Could not interview patient this AM because they were leaving to TEE  Laboratory:  Recent Labs Lab 02/16/17 1455 02/17/17 0452 02/18/17 0554  WBC 5.9 8.0 6.2  HGB 13.6 14.1 13.9  HCT 40.7 41.1 40.5  PLT 273 285 292    Recent Labs Lab 02/15/17 2358 02/16/17 1455 02/17/17 0452 02/18/17 0554 02/19/17 1011  NA 133* 137 133* 133* 135  K 3.1* 3.7 3.6 3.2* 3.8  CL 96* 104 99* 96* 96*  CO2 '29 29 28 31 '$ 33*  BUN <5* '8 7 8 6  '$ CREATININE 0.86 0.84 0.86 0.76 0.74  CALCIUM 8.8* 8.7* 8.5* 8.5* 8.7*  PROT 8.2* 7.4  --   --   --   BILITOT 0.8 0.7  --   --   --   ALKPHOS 53 50  --   --   --   ALT 53 47  --   --   --   AST 34 33  --   --   --   GLUCOSE 135* 112* 155* 181* 141*    CIWAs 12<11<0<0  Imaging/Diagnostic Tests: Dg Chest 2 View  Result Date: 02/16/2017 CLINICAL DATA:  51 y/o M; shortness of breath with productive cough. EXAM: CHEST  2 VIEW COMPARISON:   02/06/2017 chest radiograph FINDINGS: The heart size and mediastinal contours are within normal limits. Bronchitic changes in the left infrahilar region. No focal consolidation. The visualized skeletal structures are unremarkable. IMPRESSION: Bronchitic changes in left infrahilar region. No focal consolidation. Electronically Signed   By: Kristine Garbe M.D.   On: 02/16/2017 00:18   Mr Lumbar Spine W Wo Contrast  Result Date: 02/16/2017 CLINICAL DATA:  Increasing back pain over the last 3 months. IV drug abuse. Cauda equina syndrome suspected. EXAM: MRI LUMBAR SPINE WITHOUT AND WITH CONTRAST TECHNIQUE: Multiplanar and multiecho pulse sequences of the lumbar spine were obtained without and with intravenous contrast. CONTRAST:  <20 ml > MULTIHANCE GADOBENATE DIMEGLUMINE 529 MG/ML IV SOLN COMPARISON:  Acute abdominal series done 02/06/2017. abdominopelvic CT 02/06/2017. FINDINGS: Segmentation: Conventional anatomy assumed, with the last open disc space designated L5-S1. This correlates with prior imaging. Alignment: Mild straightening. There are bilateral L5 pars defects with 5 mm of anterolisthesis at L5-S1. Vertebrae: The marrow signal is diffusely heterogeneous on T2 and T1 weighted images. As above, there are chronic L5 pars defects. There are changes of diskitis and osteomyelitis at T11-12 with narrowing, T2 hyperintensity and enhancement of the intervertebral disc. There is also bone marrow edema and enhancement within the T11 and T12 vertebral bodies. Of note, the most superior visualization on this lumbar spine study is at the superior aspect of the T11 vertebral body. There are no changes of discitis or osteomyelitis in the lumbar spine. The visualized sacroiliac joints appear unremarkable. Conus medullaris: Extends to the T12 level and appears normal. No abnormal intradural enhancement or distal cord compression identified. Paraspinal and other soft tissues: There is paraspinal inflammation and  enhancement at T11-12. No epidural fluid collection is identified. Disc levels: T11-12: As above, changes of diskitis and osteomyelitis with paraspinal inflammation. There is associated disc bulging with mild mass effect on the thecal sac. No foraminal compromise or nerve root encroachment seen. T12-L1: Disc and endplate degeneration with anterior osteophytes and mild disc bulging. This level was not imaged in the axial plane. No evidence of nerve root encroachment. No significant acquired findings from L1-2 through L3-4. L4-5: Disc height and hydration are maintained. Minimal disc bulging and mild facet hypertrophy. No spinal stenosis or nerve root encroachment. L5-S1: The disc is degenerated with annular bulging and uncovering related to the pars defects and resulting anterolisthesis. The spinal canal and lateral recesses are patent. There is moderate foraminal narrowing, right-greater-than-left, with possible bilateral L5 nerve root encroachment. IMPRESSION: 1. Findings are consistent with discitis and osteomyelitis at the T11-12 level. The superior extent of this may be incompletely visualized by this examination. 2. No conus compression or abnormal intradural enhancement identified. 3. 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 Electronically Signed   By: Richardean Sale M.D.   On:  02/16/2017 11:31   Ir Fluoro Guide Ndl Plmt / Bx  Result Date: 02/17/2017 INDICATION: Concern for discitis/osteomyelitis involving T11-T12. Please perform fluoroscopic guided disc aspiration for diagnostic purposes. EXAM: IR FLUORO GUIDE NEEDLE PLACEMENT /BIOPSY COMPARISON:  Lumbar spine MRI - 02/16/2017; CT abdomen and pelvis - 02/06/2017 MEDICATIONS: None ANESTHESIA/SEDATION: Moderate (conscious) sedation was employed during this procedure. A total of Fentanyl 100 mcg was administered intravenously. Moderate  Sedation Time: 10 minutes. The patient's level of consciousness and vital signs were monitored continuously by radiology nursing throughout the procedure under my direct supervision. CONTRAST:  None FLUOROSCOPY TIME:  2 minutes, 6 seconds (335.8 mGy) COMPLICATIONS: None immediate. PROCEDURE: Informed written consent was obtained from the patient after a discussion of the risks, benefits and alternatives to treatment. A timeout was performed prior to the initiation of the procedure. The patient was positioned prone on the fluoroscopy table. The T11-T12 intervertebral disc space was marked fluoroscopically. Utilizing an oblique, left-sided approach, a 20 gauge Francine needle was a advanced into the T11-T12 intervertebral disc. Appropriate positioning was confirmed with the fluoroscopic imaging in both the anterior and lateral projections. Multiple spot fluoroscopic images were saved for procedural documentation purposes. With the needle appropriate position, approximately 2 cc of slightly purulent appearing fluid was aspirated from the disc space. All fluid was capped and sent to the laboratory for analysis. The needle was removed and superficial hemostasis was achieved with manual compression. A dressing was placed. The patient tolerated the procedure well without immediate postprocedural complication. IMPRESSION: Technically successful fluoroscopic guided T11-T12 disc aspiration. Electronically Signed   By: Sandi Mariscal M.D.   On: 02/17/2017 12:55     Sherene Sires, DO 02/21/2017, 6:11 AM PGY-1, [facility] Intern pager: [phone], text pages welcome

## 2017-02-21 NOTE — CV Procedure (Signed)
    Transesophageal Echocardiogram Note  [name] [id number] 31-Oct-1965  Procedure: Transesophageal Echocardiogram Indications: Fungemia  Procedure Details Consent: Obtained Time Out: Verified patient identification, verified procedure, site/side was marked, verified correct patient position, special equipment/implants available, Radiology Safety Procedures followed,  medications/allergies/relevent history reviewed, required imaging and test results available.  Performed  Medications:  Pt sedated by anesthesia with versed 1 mg, fentanyl 50 micrograms and diprovan 140 mg IV total.  Normal LV function; LVH; mild LAE; small oscillating density noted on MV of uncertain clinical significance; in setting of fungemia, cannot R/O vegetation; mild MR.   Complications: No apparent complications Patient did tolerate procedure well.  Kirk Ruths, MD

## 2017-02-21 NOTE — Anesthesia Procedure Notes (Signed)
Procedure Name: MAC Date/Time: 02/21/2017 9:19 AM Performed by: Orlie Dakin Pre-anesthesia Checklist: Patient identified, Emergency Drugs available, Suction available, Patient being monitored and Timeout performed Patient Re-evaluated:Patient Re-evaluated prior to induction Oxygen Delivery Method: Nasal cannula Preoxygenation: Pre-oxygenation with 100% oxygen

## 2017-02-21 NOTE — Progress Notes (Signed)
Patient requesting pain meds. Medicated as rx'd at times allowable. Pt making comments about feeling unsteady while slurring speech at times.  Pt laughed and asked if this place had a gun shop, "just kidding" he says. This information was reported to Dr Criss Rosales.  Patient then asked Dr. Drucilla Schmidt the same question which was addressed by Dr. Criss Rosales in conversation with patient.   Pt voices displeasure with Pain meds being reduced and says "that's just not gonna cut it".   Explained to patient that the MD is the one that governs his pain medication allowance. Pt shaking his head and states"this just don't make no sense".   Have continued to monitor patient..  Patient concerned that his cigarettes have been removed from his room.

## 2017-02-21 NOTE — Progress Notes (Signed)
Subjective:  He was upset that he was suspected of wanting to go outside to use drugs, he then voiced suicidal ideation when I told him he had likely fungal endocarditis.  Antibiotics:  Anti-infectives    Start     Dose/Rate Route Frequency Ordered Stop   02/21/17 1230  fluconazole (DIFLUCAN) tablet 800 mg     800 mg Oral Daily 02/21/17 1220     02/21/17 1000  fluconazole (DIFLUCAN) tablet 400 mg  Status:  Discontinued     400 mg Oral Daily 02/20/17 1130 02/21/17 1220   02/21/17 0000  fluconazole (DIFLUCAN) 200 MG tablet     800 mg Oral Daily 02/21/17 1451     02/21/17 0000  fluconazole (DIFLUCAN) 200 MG tablet     800 mg Oral Daily 02/21/17 1505 03/23/17 2359   02/19/17 1900  fluconazole (DIFLUCAN) tablet 800 mg  Status:  Discontinued     800 mg Oral Daily 02/19/17 1857 02/20/17 1130   02/18/17 1700  anidulafungin (ERAXIS) 100 mg in sodium chloride 0.9 % 100 mL IVPB  Status:  Discontinued     100 mg 78 mL/hr over 100 Minutes Intravenous Every 24 hours 02/17/17 1728 02/19/17 1857   02/17/17 2200  vancomycin (VANCOCIN) IVPB 1000 mg/200 mL premix  Status:  Discontinued     1,000 mg 200 mL/hr over 60 Minutes Intravenous Every 8 hours 02/17/17 2147 02/18/17 0950   02/17/17 2200  ceFEPIme (MAXIPIME) 2 g in dextrose 5 % 50 mL IVPB  Status:  Discontinued     2 g 100 mL/hr over 30 Minutes Intravenous Every 8 hours 02/17/17 2147 02/18/17 1024   02/17/17 1730  anidulafungin (ERAXIS) 200 mg in sodium chloride 0.9 % 200 mL IVPB    Comments:  Please start 100mg  iVPB daily tomorrow   200 mg 78 mL/hr over 200 Minutes Intravenous  Once 02/17/17 1728 02/17/17 2347   02/17/17 0000  vancomycin (VANCOCIN) IVPB 1000 mg/200 mL premix  Status:  Discontinued     1,000 mg 200 mL/hr over 60 Minutes Intravenous Every 8 hours 02/16/17 1642 02/17/17 0917   02/16/17 1430  ceFEPIme (MAXIPIME) 2 g in dextrose 5 % 50 mL IVPB  Status:  Discontinued     2 g 100 mL/hr over 30 Minutes Intravenous Every 8  hours 02/16/17 1416 02/17/17 0917   02/16/17 1415  vancomycin (VANCOCIN) 2,000 mg in sodium chloride 0.9 % 500 mL IVPB     2,000 mg 250 mL/hr over 120 Minutes Intravenous  Once 02/16/17 1405 02/16/17 1805   02/16/17 1330  piperacillin-tazobactam (ZOSYN) IVPB 3.375 g  Status:  Discontinued     3.375 g 100 mL/hr over 30 Minutes Intravenous  Once 02/16/17 1332 02/16/17 1413      Medications: Scheduled Meds: . acetaminophen  650 mg Oral Q6H  . albuterol  5 mg Nebulization Once  . buprenorphine-naloxone  1 tablet Sublingual Daily  . enoxaparin (LOVENOX) injection  40 mg Subcutaneous Q24H  . fluconazole  800 mg Oral Daily  . folic acid  1 mg Oral Daily  . lisinopril  20 mg Oral Daily  . LORazepam  0-4 mg Intravenous Q8H  . multivitamin with minerals  1 tablet Oral Daily  . nicotine  21 mg Transdermal Daily  . sodium chloride flush  3 mL Intravenous Q12H  . thiamine  100 mg Oral Daily   Continuous Infusions: PRN Meds:.albuterol, hydrALAZINE    Objective: Weight change:   Intake/Output Summary (Last  24 hours) at 02/21/17 2014 Last data filed at 02/21/17 1700  Gross per 24 hour  Intake              560 ml  Output                0 ml  Net              560 ml   Blood pressure (!) 115/93, pulse 84, temperature 98.2 F (36.8 C), temperature source Oral, resp. rate (!) 29, height 5\' 8"  (1.727 m), weight 222 lb (100.7 kg), SpO2 93 %. Temp:  [98.2 F (36.8 C)-98.4 F (36.9 C)] 98.2 F (36.8 C) (10/04 1000) Pulse Rate:  [70-84] 84 (10/04 1010) Resp:  [15-29] 29 (10/04 1010) BP: (109-205)/(73-131) 115/93 (10/04 1010) SpO2:  [90 %-98 %] 93 % (10/04 1010) Weight:  [222 lb (100.7 kg)] 222 lb (100.7 kg) (10/04 0849)  Physical Exam: General: Alert and awake, oriented x3 , animated, beligerant at times with VERY POOR inisght CV: tachy Pulm: no wheezes or resp distress Neuro: nonfocal  CBC:  CBC Latest Ref Rng & Units 02/21/2017 02/18/2017 02/17/2017  WBC 4.0 - 10.5 K/uL 6.9 6.2 8.0    Hemoglobin 13.0 - 17.0 g/dL 14.9 13.9 14.1  Hematocrit 39.0 - 52.0 % 44.0 40.5 41.1  Platelets 150 - 400 K/uL 328 292 285      BMET  Recent Labs  02/19/17 1011 02/21/17 0518  NA 135 135  K 3.8 3.8  CL 96* 96*  CO2 33* 30  GLUCOSE 141* 147*  BUN 6 9  CREATININE 0.74 0.77  CALCIUM 8.7* 9.1     Liver Panel  No results for input(s): PROT, ALBUMIN, AST, ALT, ALKPHOS, BILITOT, BILIDIR, IBILI in the last 72 hours.     Sedimentation Rate No results for input(s): ESRSEDRATE in the last 72 hours. C-Reactive Protein No results for input(s): CRP in the last 72 hours.  Micro Results: Recent Results (from the past 720 hour(s))  Urine C&S     Status: None   Collection Time: 02/06/17  5:50 PM  Result Value Ref Range Status   Specimen Description URINE, RANDOM  Final   Special Requests NONE  Final   Culture   Final    NO GROWTH Performed at [facility], [address] [place], [place] [address]    Report Status 02/08/2017 FINAL  Final  Anaerobic culture     Status: None (Preliminary result)   Collection Time: 02/17/17 12:33 PM  Result Value Ref Range Status   Specimen Description FLUID  Final   Special Requests DISC T11 12  Final   Culture   Final    NO ANAEROBES ISOLATED; CULTURE IN PROGRESS FOR 5 DAYS   Report Status PENDING  Incomplete  Aerobic Culture (superficial specimen)     Status: None   Collection Time: 02/17/17 12:34 PM  Result Value Ref Range Status   Specimen Description FLUID  Final   Special Requests DISC T11 12  Final   Gram Stain   Final    ABUNDANT WBC PRESENT, PREDOMINANTLY PMN RARE BUDDING YEAST SEEN    Culture FEW CANDIDA ALBICANS  Final   Report Status 02/19/2017 FINAL  Final  Culture, fungus without smear     Status: Abnormal (Preliminary result)   Collection Time: 02/17/17 12:34 PM  Result Value Ref Range Status   Specimen Description FLUID  Final   Special Requests DISC T11 12  Final   Culture CANDIDA  ALBICANS (A)  Final    Report Status PENDING  Incomplete  Acid Fast Smear (AFB)     Status: None   Collection Time: 02/17/17 12:38 PM  Result Value Ref Range Status   AFB Specimen Processing Concentration  Final   Acid Fast Smear Negative  Final    Comment: (NOTE) Performed At: [facility] [place], [place] [id number] Lindon Romp MD TK:[id number]    Source (AFB) FLUID  Final    Comment: DISC T11 12  Culture, blood (Routine X 2) w Reflex to ID Panel     Status: None (Preliminary result)   Collection Time: 02/17/17  5:54 PM  Result Value Ref Range Status   Specimen Description BLOOD RIGHT ANTECUBITAL  Final   Special Requests   Final    BOTTLES DRAWN AEROBIC AND ANAEROBIC Blood Culture adequate volume   Culture NO GROWTH 4 DAYS  Final   Report Status PENDING  Incomplete    Studies/Results: No results found.    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 Kathyrn Sheriff evaluating patient. He now has been found to have oscillating density on MV c/w possible fungal endocarditis  INCREASE FLUCONAZOLE to 800mg  a day and CM picked up 30 day supply from [facility] and delivered to the patient  #2 IVDU: I GREATLY appreciate Dr. Doristine Section help with this patient and plan to institute suboxone.  #3 Fungal Endocarditis: see above  #4 ? Suicidal ideation: he mentioned asking me to take his debit card and buy him a gun with hollowed out bullets. When asked  what he intended to do he clarified he would never hurt anyone else. I informed his FP team, and before I did so he said that he was "just kidding"  We spent greater than 35  minutes with the patient including greater than 50% of time in face to face counsel of the patient re his fungal osteo, possible endocarditis, IVDU and in coordination of his care with ID pharmacy, Case Management and primary team.  He should be seen in ID clinic for followup in the next 3 weeks. He will need help with paying for the remaining 5 months of antifungal therapy.      LOS: 5 days   Alcide Evener 02/21/2017, 8:14 PM

## 2017-02-21 NOTE — Progress Notes (Signed)
Patient did sign and requested to leave the unit I told him that is not allowed. Due to monitoring and safety reasons. Arthor Captain LPN

## 2017-02-21 NOTE — Telephone Encounter (Signed)
Ask by dr Daryll Drown to call pt and give him appt info, did that, discussed suboxone, he was informed that w/ coupon a weeks script is appr $43.00 at [facility], he states he cannot afford the med, ask  to have nurse call case manager and hopefully can use the match letter to pay the 43., will send this note to susan brady in case management, pt wrote the date of appt, directions to clinic and info for case management down

## 2017-02-21 NOTE — Interval H&P Note (Signed)
History and Physical Interval Note:  02/21/2017 9:37 AM  [name]  has presented today for surgery, with the diagnosis of bacteremia  The various methods of treatment have been discussed with the patient and family. After consideration of risks, benefits and other options for treatment, the patient has consented to  Procedure(s): TRANSESOPHAGEAL ECHOCARDIOGRAM (TEE) WITH MAC (N/A) as a surgical intervention .  The patient's history has been reviewed, patient examined, no change in status, stable for surgery.  I have reviewed the patient's chart and labs.  Questions were answered to the patient's satisfaction.     Kirk Ruths

## 2017-02-21 NOTE — Care Management (Signed)
Case manager has provided [place] for patient for his Diflucan 800mg  and Lisinopril 20mg . Case manager faxed scripts to outpatient pharmacy and will pickup for patient when ready. Patient has been setup with [facility].

## 2017-02-21 NOTE — Progress Notes (Signed)
  Echocardiogram Echocardiogram Transesophageal has been performed.  Matilde Bash 02/21/2017, 9:58 AM

## 2017-02-21 NOTE — Progress Notes (Signed)
MD paged that patient has c/o pain is not being managed with ordered medications and He wants to see a MD Now.Oxi IR was given 0416 and Ativan total of 4mg  IV was given. Bed alarm placed Arthor Captain LPN

## 2017-02-21 NOTE — Anesthesia Preprocedure Evaluation (Signed)
Anesthesia Evaluation  Patient identified by MRN, date of birth, ID band Patient awake    Reviewed: Allergy & Precautions, NPO status , Patient's Chart, lab work & pertinent test results  Airway Mallampati: III  TM Distance: >3 FB Neck ROM: Full    Dental  (+) Poor Dentition, Teeth Intact   Pulmonary Current Smoker,    breath sounds clear to auscultation       Cardiovascular  Rhythm:Regular Rate:Normal     Neuro/Psych    GI/Hepatic   Endo/Other    Renal/GU      Musculoskeletal   Abdominal   Peds  Hematology   Anesthesia Other Findings   Reproductive/Obstetrics                             Anesthesia Physical Anesthesia Plan  ASA: III  Anesthesia Plan: MAC   Post-op Pain Management:    Induction:   PONV Risk Score and Plan: Propofol infusion  Airway Management Planned: Natural Airway and Nasal Cannula  Additional Equipment:   Intra-op Plan:   Post-operative Plan:   Informed Consent: I have reviewed the patients History and Physical, chart, labs and discussed the procedure including the risks, benefits and alternatives for the proposed anesthesia with the patient or authorized representative who has indicated his/her understanding and acceptance.     Plan Discussed with: CRNA and Anesthesiologist  Anesthesia Plan Comments:         Anesthesia Quick Evaluation

## 2017-02-21 NOTE — Progress Notes (Signed)
Patient will not sign consent for TEE until he speaks with MD. Arthor Captain LPN

## 2017-02-21 NOTE — Progress Notes (Signed)
Patient is a moderate fall risk. Patient refuses bed alarm

## 2017-02-21 NOTE — Anesthesia Postprocedure Evaluation (Signed)
Anesthesia Post Note  Patient: [name]  Procedure(s) Performed: TRANSESOPHAGEAL ECHOCARDIOGRAM (TEE) WITH MAC (N/A )     Patient location during evaluation: Endoscopy Anesthesia Type: MAC Level of consciousness: awake, awake and alert and oriented Pain management: pain level controlled Vital Signs Assessment: post-procedure vital signs reviewed and stable Respiratory status: spontaneous breathing, nonlabored ventilation and respiratory function stable Cardiovascular status: blood pressure returned to baseline Anesthetic complications: no    Last Vitals:  Vitals:   02/21/17 1000 02/21/17 1010  BP: (!) 133/96 (!) 115/93  Pulse: 72 84  Resp: 20 (!) 29  Temp: 36.8 C   SpO2: 94% 93%    Last Pain:  Vitals:   02/21/17 1500  TempSrc:   PainSc: Asleep                 Laurine Kuyper COKER

## 2017-02-22 ENCOUNTER — Encounter (HOSPITAL_COMMUNITY): Payer: Self-pay | Admitting: Cardiology

## 2017-02-22 LAB — CULTURE, BLOOD (ROUTINE X 2)
CULTURE: NO GROWTH
Special Requests: ADEQUATE

## 2017-02-22 LAB — ANAEROBIC CULTURE

## 2017-02-22 LAB — QUANTIFERON-TB GOLD PLUS (RQFGPL)
QuantiFERON Mitogen Value: >10 IU/mL
QuantiFERON Nil Value: 0.26 IU/mL
QuantiFERON TB1 Ag Value: 0.15 IU/mL
QuantiFERON TB2 Ag Value: 0.21 IU/mL

## 2017-02-22 LAB — QUANTIFERON-TB GOLD PLUS: QuantiFERON-TB Gold Plus: NEGATIVE

## 2017-02-25 ENCOUNTER — Inpatient Hospital Stay (HOSPITAL_COMMUNITY)
Admission: EM | Admit: 2017-02-25 | Discharge: 2017-03-03 | DRG: 095 | Disposition: A | Discharge status: Home / Self Care | Payer: Self-pay | Attending: Family Medicine | Admitting: Family Medicine

## 2017-02-25 ENCOUNTER — Inpatient Hospital Stay: Admitting: Internal Medicine

## 2017-02-25 DIAGNOSIS — E871 Hypo-osmolality and hyponatremia: Secondary | ICD-10-CM | POA: Diagnosis present

## 2017-02-25 DIAGNOSIS — B1920 Unspecified viral hepatitis C without hepatic coma: Secondary | ICD-10-CM | POA: Diagnosis present

## 2017-02-25 DIAGNOSIS — M545 Low back pain: Secondary | ICD-10-CM

## 2017-02-25 DIAGNOSIS — Z5900 Homelessness unspecified: Secondary | ICD-10-CM

## 2017-02-25 DIAGNOSIS — F1911 Other psychoactive substance abuse, in remission: Secondary | ICD-10-CM | POA: Diagnosis present

## 2017-02-25 DIAGNOSIS — M869 Osteomyelitis, unspecified: Secondary | ICD-10-CM | POA: Diagnosis present

## 2017-02-25 DIAGNOSIS — F1990 Other psychoactive substance use, unspecified, uncomplicated: Secondary | ICD-10-CM | POA: Diagnosis present

## 2017-02-25 DIAGNOSIS — J449 Chronic obstructive pulmonary disease, unspecified: Secondary | ICD-10-CM | POA: Diagnosis present

## 2017-02-25 DIAGNOSIS — G8929 Other chronic pain: Secondary | ICD-10-CM | POA: Diagnosis present

## 2017-02-25 DIAGNOSIS — G062 Extradural and subdural abscess, unspecified: Principal | ICD-10-CM | POA: Diagnosis present

## 2017-02-25 DIAGNOSIS — I38 Endocarditis, valve unspecified: Secondary | ICD-10-CM | POA: Diagnosis present

## 2017-02-25 DIAGNOSIS — F17210 Nicotine dependence, cigarettes, uncomplicated: Secondary | ICD-10-CM | POA: Diagnosis present

## 2017-02-25 DIAGNOSIS — R001 Bradycardia, unspecified: Secondary | ICD-10-CM | POA: Diagnosis present

## 2017-02-25 DIAGNOSIS — M546 Pain in thoracic spine: Secondary | ICD-10-CM | POA: Diagnosis present

## 2017-02-25 DIAGNOSIS — M4624 Osteomyelitis of vertebra, thoracic region: Secondary | ICD-10-CM | POA: Diagnosis present

## 2017-02-25 DIAGNOSIS — F1910 Other psychoactive substance abuse, uncomplicated: Secondary | ICD-10-CM | POA: Diagnosis present

## 2017-02-25 DIAGNOSIS — M4644 Discitis, unspecified, thoracic region: Secondary | ICD-10-CM | POA: Diagnosis present

## 2017-02-25 DIAGNOSIS — F1410 Cocaine abuse, uncomplicated: Secondary | ICD-10-CM | POA: Diagnosis present

## 2017-02-25 DIAGNOSIS — I10 Essential (primary) hypertension: Secondary | ICD-10-CM | POA: Diagnosis present

## 2017-02-25 DIAGNOSIS — B3789 Other sites of candidiasis: Secondary | ICD-10-CM | POA: Diagnosis present

## 2017-02-25 DIAGNOSIS — M4620 Osteomyelitis of vertebra, site unspecified: Secondary | ICD-10-CM | POA: Diagnosis present

## 2017-02-25 HISTORY — DX: Discitis, unspecified, site unspecified: M46.40

## 2017-02-25 HISTORY — DX: Unspecified chronic bronchitis: J42

## 2017-02-25 HISTORY — DX: Other chronic pain: G89.29

## 2017-02-25 HISTORY — DX: Low back pain, unspecified: M54.50

## 2017-02-25 HISTORY — DX: Low back pain: M54.5

## 2017-02-25 LAB — COMPREHENSIVE METABOLIC PANEL
ALBUMIN: 3.3 g/dL — AB (ref 3.5–5.0)
ALK PHOS: 78 U/L (ref 38–126)
ALT: 62 U/L (ref 17–63)
AST: 48 U/L — AB (ref 15–41)
Anion gap: 9 (ref 5–15)
BILIRUBIN TOTAL: 0.8 mg/dL (ref 0.3–1.2)
BUN: 8 mg/dL (ref 6–20)
CALCIUM: 9.4 mg/dL (ref 8.9–10.3)
CO2: 29 mmol/L (ref 22–32)
CREATININE: 0.81 mg/dL (ref 0.61–1.24)
Chloride: 93 mmol/L — ABNORMAL LOW (ref 101–111)
GFR calc Af Amer: >60 mL/min (ref >60)
GFR calc non Af Amer: >60 mL/min (ref >60)
GLUCOSE: 120 mg/dL — AB (ref 65–99)
Potassium: 4.5 mmol/L (ref 3.5–5.1)
Sodium: 131 mmol/L — ABNORMAL LOW (ref 135–145)
TOTAL PROTEIN: 8.9 g/dL — AB (ref 6.5–8.1)

## 2017-02-25 LAB — CBC WITH DIFFERENTIAL/PLATELET
BASOS ABS: 0 10*3/uL (ref 0.0–0.1)
BASOS PCT: 0 %
EOS PCT: 1 %
Eosinophils Absolute: 0.1 10*3/uL (ref 0.0–0.7)
HCT: 42.1 % (ref 39.0–52.0)
Hemoglobin: 14.9 g/dL (ref 13.0–17.0)
Lymphocytes Relative: 24 %
Lymphs Abs: 1.5 10*3/uL (ref 0.7–4.0)
MCH: 36.8 pg — ABNORMAL HIGH (ref 26.0–34.0)
MCHC: 35.4 g/dL (ref 30.0–36.0)
MCV: 104 fL — ABNORMAL HIGH (ref 78.0–100.0)
MONO ABS: 0.7 10*3/uL (ref 0.1–1.0)
MONOS PCT: 11 %
Neutro Abs: 4.1 10*3/uL (ref 1.7–7.7)
Neutrophils Relative %: 64 %
Platelets: 288 10*3/uL (ref 150–400)
RBC: 4.05 MIL/uL — ABNORMAL LOW (ref 4.22–5.81)
RDW: 13 % (ref 11.5–15.5)
WBC: 6.4 10*3/uL (ref 4.0–10.5)

## 2017-02-25 LAB — URINALYSIS, ROUTINE W REFLEX MICROSCOPIC
BILIRUBIN URINE: NEGATIVE
Glucose, UA: NEGATIVE mg/dL
Hgb urine dipstick: NEGATIVE
KETONES UR: NEGATIVE mg/dL
Leukocytes, UA: NEGATIVE
NITRITE: NEGATIVE
PROTEIN: NEGATIVE mg/dL
SPECIFIC GRAVITY, URINE: 1.014 (ref 1.005–1.030)
pH: 7 (ref 5.0–8.0)

## 2017-02-25 LAB — SEDIMENTATION RATE: SED RATE: 81 mm/h — AB (ref 0–16)

## 2017-02-25 LAB — I-STAT CG4 LACTIC ACID, ED: Lactic Acid, Venous: 0.99 mmol/L (ref 0.5–1.9)

## 2017-02-25 LAB — C-REACTIVE PROTEIN: CRP: 3 mg/dL — AB (ref <1.0)

## 2017-02-25 MED ORDER — SODIUM CHLORIDE 0.9 % IV BOLUS (SEPSIS)
1000.0000 mL | Freq: Once | INTRAVENOUS | Status: AC
  Administered 2017-02-25: 1000 mL via INTRAVENOUS

## 2017-02-25 MED ORDER — HYDROMORPHONE HCL 1 MG/ML IJ SOLN
1.0000 mg | Freq: Once | INTRAMUSCULAR | Status: AC
  Administered 2017-02-25: 1 mg via INTRAVENOUS
  Filled 2017-02-25: qty 1

## 2017-02-25 NOTE — ED Notes (Signed)
Patient unable to give urine sample at this time

## 2017-02-25 NOTE — ED Notes (Signed)
Gave pt a coke to drink 

## 2017-02-25 NOTE — ED Triage Notes (Signed)
Pt reports recent admission to hospital for osteomylitis of t11-12. REports discharged with home antibiotics. Pt reports pain worsening. States he feels like his balance is off. Moving all extremities. Able to drive self to ED. VSS.

## 2017-02-25 NOTE — ED Provider Notes (Signed)
[facility] DEPT Provider Note   CSN: [id number] Arrival date & time: 02/25/17  1325     History   Chief Complaint Chief Complaint  Patient presents with  . Back Pain    HPI [name] is a 51 y.o. male.   Back Pain   The current episode started 2 days ago. The problem occurs constantly. The problem has been rapidly worsening. 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 The pain is present in the thoracic spine. The quality of the pain is described as stabbing. The pain does not radiate. The pain is severe. The symptoms are aggravated by certain positions, twisting and bending. Associated symptoms include a fever (subjective). Pertinent negatives include no numbness, no bowel incontinence, no perianal numbness, no bladder incontinence and no dysuria. Treatments tried: tylenol  The treatment provided mild relief.    Past Medical History:  Diagnosis Date  . CHF (congestive heart failure) ([place])   . COPD (chronic obstructive pulmonary disease) ([place])   . Panic attacks   . Tobacco abuse     Patient Active Problem List   Diagnosis Date Noted  . Bacteremia   . Fungal endocarditis   . Suicidal ideation   . Diskitis   . IVDU (intravenous drug user)   . Chronic bilateral low back pain without sciatica   . 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 Elevated troponin I level   . SOB (shortness of breath) 10/12/2015  . Troponin level elevated 10/12/2015  . Chest pain 10/12/2015  . Cocaine abuse ([place]) 10/12/2015  . Cocaine abuse ([place]) 10/09/2015  . Homeless single person   . Syncope and collapse 10/08/2015  . SOB (shortness of breath) 10/08/2015  . Chest pain 10/08/2015  . COPD (chronic obstructive pulmonary disease) ([place]) 10/08/2015  . Acute bronchitis 10/08/2015    Past Surgical History:  Procedure Laterality Date  . CARDIAC CATHETERIZATION N/A 10/14/2015   Procedure: Left Heart Cath and  Coronary Angiography;  Surgeon: Sherren Mocha, MD;  Location: [facility];  Service: Cardiovascular;  Laterality: N/A;  . FOOT SURGERY Right   . HAND SURGERY    . IR FLUORO GUIDED NEEDLE PLC ASPIRATION/INJECTION LOC  02/17/2017  . ORIF METACARPAL FRACTURE Left 2011  . ROTATOR CUFF REPAIR Right   . SHOULDER SURGERY    . TEE WITHOUT CARDIOVERSION N/A 02/21/2017   Procedure: TRANSESOPHAGEAL ECHOCARDIOGRAM (TEE) WITH MAC;  Surgeon: [name], MD;  Location: [facility];  Service: Cardiovascular;  Laterality: N/A;       Home Medications    Prior to Admission medications   Medication Sig Start Date End Date Taking? Authorizing Provider  acetaminophen (TYLENOL) 325 MG tablet Take 1 tablet (325 mg total) by mouth every 6 (six) hours as needed. Patient taking differently: Take 325 mg by mouth every 6 (six) hours as needed (for pain or headaches).  02/21/17 03/07/17 Yes Bland, Scott, DO  albuterol (PROVENTIL HFA;VENTOLIN HFA) 108 (90 Base) MCG/ACT inhaler Inhale 2 puffs into the lungs every 6 (six) hours as needed for wheezing or shortness of breath. 10/11/15  Yes Gherghe, Vella Redhead, MD  buprenorphine-naloxone (SUBOXONE) 8-2 mg SUBL SL tablet Place 1 tablet under the tongue 2 (two) times daily. Patient taking differently: Place 1 tablet under the tongue every morning.  02/21/17  Yes Sid Falcon, MD  fluconazole (DIFLUCAN) 200 MG tablet Take 4 tablets (800 mg total) by mouth daily. Patient taking differently: Take 200 mg by mouth  4 (four) times daily.  02/21/17 03/23/17 Yes Bland, Scott, DO  lisinopril (PRINIVIL,ZESTRIL) 20 MG tablet Take 1 tablet (20 mg total) by mouth daily. 02/22/17 03/24/17 Yes Bland, Scott, DO  fluconazole (DIFLUCAN) 200 MG tablet Take 4 tablets (800 mg total) by mouth daily. Patient not taking: Reported on 02/25/2017 02/21/17   Lind Covert, MD  folic acid (FOLVITE) 1 MG tablet Take 1 tablet (1 mg total) by mouth daily. 02/22/17 03/24/17  Sherene Sires, DO    guaiFENesin-dextromethorphan (ROBITUSSIN DM) 100-10 MG/5ML syrup Take 5 mLs by mouth every 4 (four) hours as needed for cough. Patient not taking: Reported on 02/25/2017 10/11/15   Caren Griffins, MD  Multiple Vitamin (MULTIVITAMIN WITH MINERALS) TABS tablet Take 1 tablet by mouth daily. Patient not taking: Reported on 02/25/2017 02/22/17 03/24/17  Sherene Sires, DO  nicotine (EQ NICOTINE) 21 mg/24hr patch Place 1 patch (21 mg total) onto the skin daily. Patient not taking: Reported on 02/25/2017 10/11/15   Caren Griffins, MD  polyethylene glycol (MIRALAX / Floria Raveling) packet Take 17 g by mouth daily. Patient not taking: Reported on 02/25/2017 02/06/17   Ocie Cornfield T, PA-C  thiamine 100 MG tablet Take 1 tablet (100 mg total) by mouth daily. 02/22/17 03/24/17  Sherene Sires, DO    Family History Family History  Problem Relation Age of Onset  . Hypertension Mother   . Cancer Other   . Stroke Other   . Coronary artery disease Other     Social History Social History  Substance Use Topics  . Smoking status: Current Every Day Smoker    Packs/day: 0.50    Types: Cigarettes  . Smokeless tobacco: Never Used  . Alcohol use Yes     Comment: Daily. Heavy.      Allergies   Penicillins   Review of Systems Review of Systems  Constitutional: Positive for fever (subjective).  Gastrointestinal: Negative for bowel incontinence.  Genitourinary: Negative for bladder incontinence and dysuria.  Musculoskeletal: Positive for back pain.  Neurological: Negative for numbness.  All other systems are reviewed and are negative for acute change except as noted in the HPI    Physical Exam Updated Vital Signs BP (!) 132/94 (BP Location: Right Arm)   Pulse 67   Temp 98.3 F (36.8 C) (Oral)   Resp 16   SpO2 94%   Physical Exam  Constitutional: He is oriented to person, place, and time. He appears well-developed and well-nourished. No distress.  HENT:  Head: Normocephalic and atraumatic.   Nose: Nose normal.  Eyes: Pupils are equal, round, and reactive to light. Conjunctivae and EOM are normal. Right eye exhibits no discharge. Left eye exhibits no discharge. No scleral icterus.  Neck: Normal range of motion. Neck supple.  Cardiovascular: Normal rate and regular rhythm.  Exam reveals no gallop and no friction rub.   No murmur heard. Pulmonary/Chest: Effort normal and breath sounds normal. No stridor. No respiratory distress. He has no rales.  Abdominal: Soft. He exhibits no distension. There is no tenderness.  Musculoskeletal: He exhibits no edema.       Thoracic back: He exhibits tenderness.       Back:  Neurological: He is alert and oriented to person, place, and time.  Spine Exam: Strength: 5/5 throughout LE bilaterally (hip flexion/extension, adduction/abduction; knee flexion/extension; foot dorsiflexion/plantarflexion, inversion/eversion; great toe inversion) Sensation: Intact to light touch in proximal and distal LE bilaterally Reflexes: 2+ quadriceps and achilles reflexe  Skin: Skin is warm and dry. No rash  noted. He is not diaphoretic. No erythema.  Psychiatric: He has a normal mood and affect.  Vitals reviewed.    ED Treatments / Results  Labs (all labs ordered are listed, but only abnormal results are displayed) Labs Reviewed  COMPREHENSIVE METABOLIC PANEL - Abnormal; Notable for the following:       Result Value   Sodium 131 (*)    Chloride 93 (*)    Glucose, Bld 120 (*)    Total Protein 8.9 (*)    Albumin 3.3 (*)    AST 48 (*)    All other components within normal limits  CBC WITH DIFFERENTIAL/PLATELET - Abnormal; Notable for the following:    RBC 4.05 (*)    MCV 104.0 (*)    MCH 36.8 (*)    All other components within normal limits  SEDIMENTATION RATE - Abnormal; Notable for the following:    Sed Rate 81 (*)    All other components within normal limits  C-REACTIVE PROTEIN - Abnormal; Notable for the following:    CRP 3.0 (*)    All other  components within normal limits  URINALYSIS, ROUTINE W REFLEX MICROSCOPIC  I-STAT CG4 LACTIC ACID, ED    EKG  EKG Interpretation None       Radiology No results found.  Procedures Procedures (including critical care time)  Medications Ordered in ED Medications  HYDROmorphone (DILAUDID) injection 1 mg (1 mg Intravenous Given 02/25/17 2153)  sodium chloride 0.9 % bolus 1,000 mL (0 mLs Intravenous Stopped 02/26/17 0025)  HYDROmorphone (DILAUDID) injection 1 mg (1 mg Intravenous Given 02/26/17 0023)     Initial Impression / Assessment and Plan / ED Course  I have reviewed the triage vital signs and the nursing notes.  Pertinent labs & imaging results that were available during my care of the patient were reviewed by me and considered in my medical decision making (see chart for details).     ESR and CRP are trending up from most recent checks concerning for worsening infection. We'll obtain an MRI to assess for his of his osteomyelitis/discitis.  Patient care turned over to Dr Betsey Holiday at [facility]. Patient case and results discussed in detail; please see their note for further ED managment.        Fatima Blank, MD 02/26/17 [phone]

## 2017-02-26 ENCOUNTER — Encounter (HOSPITAL_COMMUNITY): Payer: Self-pay | Admitting: General Practice

## 2017-02-26 ENCOUNTER — Emergency Department (HOSPITAL_COMMUNITY): Payer: Self-pay

## 2017-02-26 DIAGNOSIS — F1990 Other psychoactive substance use, unspecified, uncomplicated: Secondary | ICD-10-CM

## 2017-02-26 DIAGNOSIS — I10 Essential (primary) hypertension: Secondary | ICD-10-CM | POA: Diagnosis present

## 2017-02-26 DIAGNOSIS — M4620 Osteomyelitis of vertebra, site unspecified: Secondary | ICD-10-CM

## 2017-02-26 DIAGNOSIS — Z720 Tobacco use: Secondary | ICD-10-CM

## 2017-02-26 DIAGNOSIS — M546 Pain in thoracic spine: Secondary | ICD-10-CM | POA: Diagnosis present

## 2017-02-26 DIAGNOSIS — M4644 Discitis, unspecified, thoracic region: Secondary | ICD-10-CM | POA: Diagnosis present

## 2017-02-26 LAB — COMPREHENSIVE METABOLIC PANEL
ALBUMIN: 2.9 g/dL — AB (ref 3.5–5.0)
ALT: 51 U/L (ref 17–63)
AST: 42 U/L — AB (ref 15–41)
Alkaline Phosphatase: 63 U/L (ref 38–126)
Anion gap: 6 (ref 5–15)
BILIRUBIN TOTAL: 1.1 mg/dL (ref 0.3–1.2)
BUN: 7 mg/dL (ref 6–20)
CHLORIDE: 98 mmol/L — AB (ref 101–111)
CO2: 30 mmol/L (ref 22–32)
CREATININE: 0.89 mg/dL (ref 0.61–1.24)
Calcium: 8.9 mg/dL (ref 8.9–10.3)
GFR calc Af Amer: >60 mL/min (ref >60)
GLUCOSE: 123 mg/dL — AB (ref 65–99)
Potassium: 4.3 mmol/L (ref 3.5–5.1)
Sodium: 134 mmol/L — ABNORMAL LOW (ref 135–145)
TOTAL PROTEIN: 7.7 g/dL (ref 6.5–8.1)

## 2017-02-26 LAB — CBC
HEMATOCRIT: 40 % (ref 39.0–52.0)
HEMOGLOBIN: 13.7 g/dL (ref 13.0–17.0)
MCH: 35.5 pg — AB (ref 26.0–34.0)
MCHC: 34.3 g/dL (ref 30.0–36.0)
MCV: 103.6 fL — ABNORMAL HIGH (ref 78.0–100.0)
Platelets: 273 10*3/uL (ref 150–400)
RBC: 3.86 MIL/uL — AB (ref 4.22–5.81)
RDW: 13.2 % (ref 11.5–15.5)
WBC: 6.2 10*3/uL (ref 4.0–10.5)

## 2017-02-26 LAB — HEMOGLOBIN A1C
Hgb A1c MFr Bld: 6.3 % — ABNORMAL HIGH (ref 4.8–5.6)
Mean Plasma Glucose: 134.11 mg/dL

## 2017-02-26 MED ORDER — ENSURE ENLIVE PO LIQD
237.0000 mL | Freq: Two times a day (BID) | ORAL | Status: DC
  Administered 2017-02-27 – 2017-03-02 (×6): 237 mL via ORAL

## 2017-02-26 MED ORDER — CYCLOBENZAPRINE HCL 10 MG PO TABS
10.0000 mg | ORAL_TABLET | Freq: Three times a day (TID) | ORAL | Status: DC
  Administered 2017-02-26 – 2017-03-03 (×18): 10 mg via ORAL
  Filled 2017-02-26 (×18): qty 1

## 2017-02-26 MED ORDER — VITAMIN B-1 100 MG PO TABS
100.0000 mg | ORAL_TABLET | Freq: Every day | ORAL | Status: DC
  Administered 2017-02-26 – 2017-03-03 (×6): 100 mg via ORAL
  Filled 2017-02-26 (×6): qty 1

## 2017-02-26 MED ORDER — BUPRENORPHINE HCL-NALOXONE HCL 8-2 MG SL SUBL
1.0000 | SUBLINGUAL_TABLET | Freq: Two times a day (BID) | SUBLINGUAL | Status: DC
  Administered 2017-02-26 – 2017-03-01 (×7): 1 via SUBLINGUAL
  Filled 2017-02-26 (×7): qty 1

## 2017-02-26 MED ORDER — ALBUTEROL SULFATE HFA 108 (90 BASE) MCG/ACT IN AERS: 2.0000 | INHALATION_SPRAY | Freq: Four times a day (QID) | RESPIRATORY_TRACT | Status: DC | PRN

## 2017-02-26 MED ORDER — POLYETHYLENE GLYCOL 3350 17 G PO PACK
17.0000 g | PACK | Freq: Every day | ORAL | Status: DC
  Administered 2017-02-26 – 2017-03-03 (×5): 17 g via ORAL
  Filled 2017-02-26 (×6): qty 1

## 2017-02-26 MED ORDER — ACETAMINOPHEN 325 MG PO TABS
650.0000 mg | ORAL_TABLET | Freq: Four times a day (QID) | ORAL | Status: DC | PRN
  Administered 2017-03-02 (×2): 650 mg via ORAL
  Filled 2017-02-26 (×2): qty 2

## 2017-02-26 MED ORDER — THIAMINE HCL 100 MG/ML IJ SOLN: 100.0000 mg | Freq: Every day | INTRAMUSCULAR | Status: DC

## 2017-02-26 MED ORDER — ALBUTEROL SULFATE (2.5 MG/3ML) 0.083% IN NEBU: 2.5000 mg | INHALATION_SOLUTION | Freq: Four times a day (QID) | RESPIRATORY_TRACT | Status: DC | PRN

## 2017-02-26 MED ORDER — ENOXAPARIN SODIUM 40 MG/0.4ML ~~LOC~~ SOLN
40.0000 mg | SUBCUTANEOUS | Status: DC
  Administered 2017-02-26 – 2017-02-27 (×2): 40 mg via SUBCUTANEOUS
  Filled 2017-02-26 (×3): qty 0.4

## 2017-02-26 MED ORDER — FLUCONAZOLE 200 MG PO TABS
800.0000 mg | ORAL_TABLET | Freq: Every day | ORAL | Status: DC
  Administered 2017-02-26 – 2017-03-03 (×6): 800 mg via ORAL
  Filled 2017-02-26 (×4): qty 4
  Filled 2017-02-26: qty 8
  Filled 2017-02-26: qty 4

## 2017-02-26 MED ORDER — GADOBENATE DIMEGLUMINE 529 MG/ML IV SOLN
20.0000 mL | Freq: Once | INTRAVENOUS | Status: AC | PRN
  Administered 2017-02-26: 20 mL via INTRAVENOUS

## 2017-02-26 MED ORDER — FOLIC ACID 1 MG PO TABS
1.0000 mg | ORAL_TABLET | Freq: Every day | ORAL | Status: DC
  Administered 2017-02-26 – 2017-03-03 (×6): 1 mg via ORAL
  Filled 2017-02-26 (×6): qty 1

## 2017-02-26 MED ORDER — LORAZEPAM 2 MG/ML IJ SOLN
1.0000 mg | Freq: Four times a day (QID) | INTRAMUSCULAR | Status: AC | PRN
  Administered 2017-02-28: 1 mg via INTRAVENOUS
  Filled 2017-02-26: qty 1

## 2017-02-26 MED ORDER — LISINOPRIL 20 MG PO TABS
20.0000 mg | ORAL_TABLET | Freq: Every day | ORAL | Status: DC
  Administered 2017-02-26 – 2017-02-28 (×3): 20 mg via ORAL
  Filled 2017-02-26 (×3): qty 1

## 2017-02-26 MED ORDER — ADULT MULTIVITAMIN W/MINERALS CH
1.0000 | ORAL_TABLET | Freq: Every day | ORAL | Status: DC
  Administered 2017-02-26 – 2017-03-03 (×6): 1 via ORAL
  Filled 2017-02-26 (×6): qty 1

## 2017-02-26 MED ORDER — LORAZEPAM 1 MG PO TABS: 1.0000 mg | ORAL_TABLET | Freq: Four times a day (QID) | ORAL | Status: AC | PRN

## 2017-02-26 MED ORDER — SODIUM CHLORIDE 0.9 % IV SOLN
200.0000 mg | Freq: Once | INTRAVENOUS | Status: DC
  Filled 2017-02-26: qty 200

## 2017-02-26 MED ORDER — ONDANSETRON HCL 4 MG PO TABS: 4.0000 mg | ORAL_TABLET | Freq: Four times a day (QID) | ORAL | Status: DC | PRN

## 2017-02-26 MED ORDER — IBUPROFEN 600 MG PO TABS
800.0000 mg | ORAL_TABLET | Freq: Four times a day (QID) | ORAL | Status: DC | PRN
  Administered 2017-02-26 (×2): 800 mg via ORAL
  Filled 2017-02-26 (×2): qty 1

## 2017-02-26 MED ORDER — ONDANSETRON HCL 4 MG/2ML IJ SOLN: 4.0000 mg | Freq: Four times a day (QID) | INTRAMUSCULAR | Status: DC | PRN

## 2017-02-26 MED ORDER — ACETAMINOPHEN 650 MG RE SUPP: 650.0000 mg | Freq: Four times a day (QID) | RECTAL | Status: DC | PRN

## 2017-02-26 MED ORDER — HYDROMORPHONE HCL 1 MG/ML IJ SOLN
1.0000 mg | Freq: Once | INTRAMUSCULAR | Status: AC
  Administered 2017-02-26: 1 mg via INTRAVENOUS
  Filled 2017-02-26: qty 1

## 2017-02-26 NOTE — Progress Notes (Signed)
Family Medicine Progress Note  Discussed case with Dr. Tommy Medal from ID. Recommended switching from eraxis to fluconazole 800mg . Switch was made. Will follow up their recommendations, likely will require a neurosurgery consult. Will continue to follow.  Guadalupe Dawn MD PGY-1 Family Medicine Resident

## 2017-02-26 NOTE — Consult Note (Signed)
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 Was found to have oscillating density of MV c/w potential fungal endocarditis. Awaiting to be readmitted for pain control and antifungal medications. Previously started on suboxone therapy with recent admission.    Plan: 1. Resume Fluconazole at 800 mg QD  2. MRI with potential epidural abscess formation - would get neurosurgery to re-evaluate [name] for potential intervention.  3. Discussed with microlab regarding sensitivities on C.Albicans. Requested to run them on previously collected fluid from 9/30 - in process.    Active Problems:   Discitis thoracic region   . buprenorphine-naloxone  1 tablet Sublingual BID  . cyclobenzaprine  10 mg Oral TID  . enoxaparin (LOVENOX) injection  40 mg Subcutaneous Q24H  . fluconazole  800 mg Oral Daily  . folic acid  1 mg Oral Daily  . lisinopril  20 mg Oral Daily  . multivitamin with minerals  1 tablet Oral Daily  . polyethylene glycol  17 g Oral Daily  . thiamine  100 mg Oral Daily   Or  . thiamine  100 mg Intravenous Daily    HPI: [name] is a 51 y.o. male that is known to our service after treating him during recent hospitalization 9/29 - 10/4. At that time he was found to have candida albicans growing in aspirate of fluid at T11 disc obtained in IR on 9/30.   He reports to have been taking the fluconazole however was taking it in 4 divided doses of 200 mg each. Initially with some improvement in back pain during last hospitalization however has been worsening. Affects his day to day  activities and interrupts ability to get dressed in morning. Has been back and forth between hotels as he is currently homeless. Reports he has been compliant with Suboxone therapy and has not had any other substances since discharge. Reports a few alcoholic drinks nightly with last one 2 nights ago. No fevers, chills, rashes.   Review of Systems: Review of Systems  All other systems reviewed and are negative.   Past Medical History:  Diagnosis Date  . CHF (congestive heart failure) ([place])   . COPD (chronic obstructive pulmonary disease) ([place])   . Panic attacks   . Tobacco abuse     Social History  Substance Use Topics  . Smoking status: Current Every Day Smoker    Packs/day: 0.50    Types: Cigarettes  . Smokeless tobacco: Never Used  . Alcohol use Yes     Comment: Daily. Heavy.     Family History  Problem Relation Age of Onset  . Hypertension Mother   . Cancer Other   . Stroke Other   . Coronary artery disease Other    Allergies  Allergen Reactions  . Penicillins     From childhood: Has patient had a PCN reaction causing immediate rash, facial/tongue/throat swelling, SOB or lightheadedness with hypotension: Unknown Has patient had a PCN reaction causing severe rash involving mucus  membranes or skin necrosis: Unknown Has patient had a PCN reaction that required hospitalization: Unknown Has patient had a PCN reaction occurring within the last 10 years: No If all of the above answers are "NO", then may proceed with Cephalosporin use.     OBJECTIVE: Blood pressure 120/76, pulse (!) 49, temperature 98.5 F (36.9 C), temperature source Rectal, resp. rate 18, SpO2 93 %.  Physical Exam  Constitutional: He is oriented to person, place, and time. No distress.  Lying on stretcher. Appears fairly comfortable.   HENT:  Mouth/Throat: Oropharynx is clear and moist. No oral lesions. Normal dentition. No dental caries.  Eyes: No scleral icterus.  Cardiovascular: Regular rhythm  and normal heart sounds.  Bradycardia present.   Pulmonary/Chest: Effort normal and breath sounds normal.  Abdominal: Soft. He exhibits no distension. There is no tenderness.  Musculoskeletal: He exhibits no edema.  Neurological: He is alert and oriented to person, place, and time.  Skin: Skin is warm and dry. No rash noted. He is not diaphoretic.  Psychiatric: Mood and affect normal.    Lab Results Lab Results  Component Value Date   WBC 6.2 02/26/2017   HGB 13.7 02/26/2017   HCT 40.0 02/26/2017   MCV 103.6 (H) 02/26/2017   PLT 273 02/26/2017    Lab Results  Component Value Date   CREATININE 0.89 02/26/2017   BUN 7 02/26/2017   NA 134 (L) 02/26/2017   K 4.3 02/26/2017   CL 98 (L) 02/26/2017   CO2 30 02/26/2017    Lab Results  Component Value Date   ALT 51 02/26/2017   AST 42 (H) 02/26/2017   ALKPHOS 63 02/26/2017   BILITOT 1.1 02/26/2017     Microbiology: Recent Results (from the past 240 hour(s))  Anaerobic culture     Status: None   Collection Time: 02/17/17 12:33 PM  Result Value Ref Range Status   Specimen Description FLUID  Final   Special Requests DISC T11 12  Final   Culture NO ANAEROBES ISOLATED  Final   Report Status 02/22/2017 FINAL  Final  Aerobic Culture (superficial specimen)     Status: None   Collection Time: 02/17/17 12:34 PM  Result Value Ref Range Status   Specimen Description FLUID  Final   Special Requests DISC T11 12  Final   Gram Stain   Final    ABUNDANT WBC PRESENT, PREDOMINANTLY PMN RARE BUDDING YEAST SEEN    Culture FEW CANDIDA ALBICANS  Final   Report Status 02/19/2017 FINAL  Final  Culture, fungus without smear     Status: Abnormal (Preliminary result)   Collection Time: 02/17/17 12:34 PM  Result Value Ref Range Status   Specimen Description FLUID  Final   Special Requests DISC T11 12  Final   Culture CANDIDA ALBICANS (A)  Final   Report Status PENDING  Incomplete  Acid Fast Smear (AFB)     Status: None   Collection Time:  02/17/17 12:38 PM  Result Value Ref Range Status   AFB Specimen Processing Concentration  Final   Acid Fast Smear Negative  Final    Comment: (NOTE) Performed At: [facility] [place], [place] [id number] Lindon Romp MD QI:[id number]    Source (AFB) FLUID  Final    Comment: DISC T11 12  Culture, blood (Routine X 2) w Reflex to ID Panel     Status: None   Collection Time: 02/17/17  5:54 PM  Result Value Ref Range Status   Specimen  Description BLOOD RIGHT ANTECUBITAL  Final   Special Requests   Final    BOTTLES DRAWN AEROBIC AND ANAEROBIC Blood Culture adequate volume   Culture NO GROWTH 5 DAYS  Final   Report Status 02/22/2017 FINAL  Final    Janene Madeira, MSN, NP-C [facility] for Infectious Disease [facility] Pager: [phone]  02/26/2017 11:57 AM

## 2017-02-26 NOTE — ED Notes (Signed)
Attempted report 

## 2017-02-26 NOTE — ED Provider Notes (Signed)
Patient signed out to me to follow-up on MRI. 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 He was recently hospitalized and treatment was initiated in conjunction with infectious disease. Patient discharged on oral therapy, as he is an IV drug abuser.Since going home with unclear if he is taking his medications as prescribed, but he has been experiencing increasing pain. Repeat MRI shows no significant improvement of the previous areas are now new areas of infection, possible epidural abscess. There is no neurologic deficit.  Patient will require repeat hospitalization.   Orpah Greek, MD 02/26/17 [phone]

## 2017-02-26 NOTE — ED Notes (Signed)
Gave pt graham crackers, peanut butter & ice water, per [facility] - RN.

## 2017-02-26 NOTE — H&P (Signed)
[facility] Admission History and Physical Service Pager: [phone]  Patient name: [name] Medical record number: [id number] Date of birth: October 19, 1965 Age: 51 y.o. Gender: male  Primary Care Provider: Patient, No Pcp Per Consultants: will consult id in morning Code Status: Full  Chief Complaint: back pain  Assessment and Plan: [name] is a 51 y.o. male presenting as a readmission for out of control pain, increasing inflammatory markers and possible increased enhancement of epidural abscess. Patient has a known diagnosis of candidal osteomyelitis and discitis. 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 Patient apparently taking fluconazole 223m qid instead of 8046monce daily as prescribed. Will ask ID to once again to provide recommendations regarding treatment. Can likely send home with oral regimen for 6-12 months after dc once pain is better controlled. Pending ID recommendations will possibly ask neurosurgery to re-evaluate for surgical drainage.   T11/T12 Osteomyelitis/Discitis Patient with known osteomyelitis and discitis of T11-T12. MRI at this admission is unchanged aside from slight worsening of ventral epidural enhancement which is concerning for developing epidural abscess. Patient is not in significant amount of distress upon examination in ed. Afebrile since presentation and white count 6.4. Will admit for pain control. Will initially start on IV eraxis and transition to PO fluconazole pending clinical course. With concern of mitral valve involvement, could consider dual therapy of amphotericin B +/- flucytosine per [employer], but TEE 02/21/17 was not definitive.  - admit to family medicine teaching service, Appropriate for floor, Dr. ChErin Hearing vitals per floor - cardiac monitoring - heart healthy diet as tolerated - will treat pain with scheduled suboxone and  flexeril (outpt regimen) - will consider oxycodone prn if pain uncontrolled - blood culture x2 - cbc, cmp in am - will consult ID in AM - Consider possible neurosurgical consult given enhanced epidural space, unsure they have anything to add from previous admission - Will start on Eraxis for fungal coverage, 20056moading dose and then 100m44mily  Hx of Polysubstance Abuse:  Per patient he has taken no drugs since discharge. He has only used his prescribed suboxone. He was very forthcoming at last admission so no reason to doubt his account. Regardless will continue to monitor for s/s of withdrawal. Will continue prescribed suboxone while inpatient. - Continue suboxone 8mg 28m (consistent with Dr. MulleDoristine Sectionmmendations 10/4 for continuing 8-2mg B70mfor the next 7 days)  Hx of Tobacco Use Disorder. Patient has long history of tobacco use. Currently smokes. Refused nicotine patch in ed. - consider starting nicotine patch if patient would like  Hx of Alcohol Use Disorder: Patient has history of heavy alcohol intake - CIWA protocol  CHF: Patient states he was diagnosed with CHF in prison recently. He states he is not currently taking any medications and was never started on any. He is not having any active signs or symptoms of CHF exacerbation at this time. ECHO 02/19/17 with EF of 50-55% but severe LVH and G2DD. - Consider starting him on optimal therapy.  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 Per patient report he did not pick up this medication so it is unlikely he took it, though was provided for free [facility] and delivered to patient prior to discharge.  - Restart/continue lisinopril 10 mg  Hepatitis C Diagnosed with  Hep C at last admission. Hep C vrs RNA positive from 01/3017. Has not been to follow up appointment. Will  appreciate ID recs regarding management of this. - f/u id after consultation in am  Hyponatremia Patient has had very poor po intake of food throughout last 2 days. Suspect this value is likely reflective of that. I expect this to increase after he eats while inpatient. - daily bmp to trend - possible start ns if continue to decrease  PMH is significant for CHF, COPD, Panic Attacks, Tobacco abuse, IV Drug abuse.  FEN/GI: heart healthy diet Prophylaxis: lovenox  Disposition: likely home  History of Present Illness:  [name] is a 51 y.o. male presenting as readmission due to increasing back pain, increasing inflammatory markers. He initially presented on 9/29 for 10/10 back pain. Subsequent workup revealed osteomyelitis/discitis at T11-T12 with mild anterior epidural phlegmon, but no discrete abscess. Patient with culture taken by IR at disk level which grew out candida albicans. Prior to discharge patient found to have possible small vegetation on MV via echo cardiogram. Patient was evaluated by neurosurgery who felt the patient was non-op. Per ID recommendations, who consulted throughout patient's admission, patient was discharged to home on 10/4 with oral Fluconazole for 6 months to a year. Patient developed increasing back pain since that time and has represented to cone. Pain control was an issue at patient's last admission as he is an IVDU. He tried to leave the hospital multiple times for small amounts of time to go "clear his head".   Patient says he went to get one of his prescriptions filled (suboxone) and took those but could not afford others. He says he took a prescription we had provided for him 4 times a day. He could not recall the name of this medication. He says he felt "alright" at first and was aware of pain but that pain started to get worse the night before last (Sunday). He says pain is now worse than when he initially presented. He felt hot one day. Pain was so  intense today he had to "basically crawl" to the bathroom to take a hot bath in attempt to find relief. Feeling somewhat weak/"drained." He is frustrated that he has been going through this pain for about 3.5 weeks. Patient was able to drive himself to the ED.    Review Of Systems: Per HPI with the following additions:  Review of Systems  Constitutional: Positive for fever. Negative for chills.  HENT: Negative for congestion and sore throat.   Eyes: Negative for double vision and pain.  Respiratory: Negative for cough and shortness of breath.   Cardiovascular: Negative for chest pain and palpitations.  Gastrointestinal: Positive for constipation. Negative for diarrhea.  Genitourinary: Negative for dysuria and frequency.  Musculoskeletal: Positive for back pain. Negative for falls.  Skin: Negative for rash.  Neurological: Positive for weakness. Negative for focal weakness and headaches.    Patient Active Problem List   Diagnosis Date Noted  . Discitis thoracic region 02/26/2017  . Bacteremia   . Fungal endocarditis   . Suicidal ideation   . Diskitis   . IVDU (intravenous drug user)   . Chronic bilateral low back pain without sciatica   . 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 Vertebral osteomyelitis ([place]) 02/16/2017  . Elevated troponin I level   . SOB (shortness of breath) 10/12/2015  . Troponin level elevated 10/12/2015  . Chest pain 10/12/2015  . Cocaine abuse ([place]) 10/12/2015  . Cocaine abuse (  [place]) 10/09/2015  . Homeless single person   . Syncope and collapse 10/08/2015  . SOB (shortness of breath) 10/08/2015  . Chest pain 10/08/2015  . COPD (chronic obstructive pulmonary disease) ([place]) 10/08/2015  . Acute bronchitis 10/08/2015    Past Medical History: Past Medical History:  Diagnosis Date  . CHF (congestive heart failure) ([place])   . COPD (chronic obstructive pulmonary disease) ([place])   . Panic attacks   . Tobacco abuse     Past Surgical History: Past Surgical History:   Procedure Laterality Date  . CARDIAC CATHETERIZATION N/A 10/14/2015   Procedure: Left Heart Cath and Coronary Angiography;  Surgeon: Sherren Mocha, MD;  Location: [facility];  Service: Cardiovascular;  Laterality: N/A;  . FOOT SURGERY Right   . HAND SURGERY    . IR FLUORO GUIDED NEEDLE PLC ASPIRATION/INJECTION LOC  02/17/2017  . ORIF METACARPAL FRACTURE Left 2011  . ROTATOR CUFF REPAIR Right   . SHOULDER SURGERY    . TEE WITHOUT CARDIOVERSION N/A 02/21/2017   Procedure: TRANSESOPHAGEAL ECHOCARDIOGRAM (TEE) WITH MAC;  Surgeon: [name], MD;  Location[facility];  Service: Cardiovascular;  Laterality: N/A;    Social History: Social History  Substance Use Topics  . Smoking status: Current Every Day Smoker    Packs/day: 0.50    Types: Cigarettes  . Smokeless tobacco: Never Used  . Alcohol use Yes     Comment: Daily. Heavy.    Additional social history: Was living in hotel. Says no drug use or alcohol use since leaving hospital.  Please also refer to relevant sections of EMR.  Family History: Family History  Problem Relation Age of Onset  . Hypertension Mother   . Cancer Other   . Stroke Other   . Coronary artery disease Other     Allergies and Medications: Allergies  Allergen Reactions  . Penicillins     From childhood: Has patient had a PCN reaction causing immediate rash, facial/tongue/throat swelling, SOB or lightheadedness with hypotension: Unknown Has patient had a PCN reaction causing severe rash involving mucus membranes or skin necrosis: Unknown Has patient had a PCN reaction that required hospitalization: Unknown Has patient had a PCN reaction occurring within the last 10 years: No If all of the above answers are "NO", then may proceed with Cephalosporin use.    No current facility-administered medications on file prior to encounter.    Current Outpatient Prescriptions on File Prior to Encounter  Medication Sig Dispense Refill  .  acetaminophen (TYLENOL) 325 MG tablet Take 1 tablet (325 mg total) by mouth every 6 (six) hours as needed. (Patient taking differently: Take 325 mg by mouth every 6 (six) hours as needed (for pain or headaches). ) 64 tablet 0  . albuterol (PROVENTIL HFA;VENTOLIN HFA) 108 (90 Base) MCG/ACT inhaler Inhale 2 puffs into the lungs every 6 (six) hours as needed for wheezing or shortness of breath. 1 Inhaler 2  . buprenorphine-naloxone (SUBOXONE) 8-2 mg SUBL SL tablet Place 1 tablet under the tongue 2 (two) times daily. (Patient taking differently: Place 1 tablet under the tongue every morning. ) 14 tablet 0  . fluconazole (DIFLUCAN) 200 MG tablet Take 4 tablets (800 mg total) by mouth daily. (Patient taking differently: Take 200 mg by mouth 4 (four) times daily. ) 120 tablet 5  . lisinopril (PRINIVIL,ZESTRIL) 20 MG tablet Take 1 tablet (20 mg total) by mouth daily. 30 tablet 2  . fluconazole (DIFLUCAN) 200 MG tablet Take 4 tablets (800 mg total) by  mouth daily. (Patient not taking: Reported on 02/25/2017) 30 tablet 5  . folic acid (FOLVITE) 1 MG tablet Take 1 tablet (1 mg total) by mouth daily. 30 tablet 1  . guaiFENesin-dextromethorphan (ROBITUSSIN DM) 100-10 MG/5ML syrup Take 5 mLs by mouth every 4 (four) hours as needed for cough. (Patient not taking: Reported on 02/25/2017) 118 mL 0  . Multiple Vitamin (MULTIVITAMIN WITH MINERALS) TABS tablet Take 1 tablet by mouth daily. (Patient not taking: Reported on 02/25/2017) 30 tablet 0  . nicotine (EQ NICOTINE) 21 mg/24hr patch Place 1 patch (21 mg total) onto the skin daily. (Patient not taking: Reported on 02/25/2017) 28 patch 0  . polyethylene glycol (MIRALAX / GLYCOLAX) packet Take 17 g by mouth daily. (Patient not taking: Reported on 02/25/2017) 14 each 0  . thiamine 100 MG tablet Take 1 tablet (100 mg total) by mouth daily. 30 tablet 0    Objective: BP (!) 159/107   Pulse 64   Temp 98.3 F (36.8 C) (Oral)   Resp (!) 23   SpO2 93%  Exam: General: Alert  and Oriented x 3, NAD Eyes: PERRL, EOMI ENTM: Normal pharyngeal mucosa, no exudates, poor dentition Neck: No LAD, no thyroidmegaly Cardiovascular: RRR, normal S1, S2, no murmurs, gallops. Palpable dp/pt bilaterally Respiratory: CTAB, no wheezing, no crackles Gastrointestinal: somewhat distended and hypertympanic, soft, non-tender, +bs MSK: FROM in all extremities, 5/5 strength BLE, BUE. Extension of right leg does cause some back pain. Derm: warm, dry, intact, no rashes; tattoos present Neuro: No focal neuro deficits, Alert, oriented x3 Psych: normal mood, appropriate behavior  Labs and Imaging: CBC BMET   Recent Labs Lab 02/25/17 1439  WBC 6.4  HGB 14.9  HCT 42.1  PLT 288    Recent Labs Lab 02/25/17 1439  NA 131*  K 4.5  CL 93*  CO2 29  BUN 8  CREATININE 0.81  GLUCOSE 120*  CALCIUM 9.4     Mr Thoracic Spine W Wo Contrast  Result Date: 02/26/2017 CLINICAL DATA:  T11-12 osteomyelitis.  Worsening low back pain. EXAM: MRI THORACIC WITHOUT AND WITH CONTRAST TECHNIQUE: Multiplanar and multiecho pulse sequences of the thoracic spine were obtained without and with intravenous contrast. CONTRAST:  23m MULTIHANCE GADOBENATE DIMEGLUMINE 529 MG/ML IV SOLN COMPARISON:  Thoracic spine MRI 02/18/2017 FINDINGS: MRI THORACIC SPINE FINDINGS Alignment:  Physiologic. Vertebrae: Persistent loss of normal T1 weighted signal at the T11 and T12 vertebral bodies with endplate irregularities and abnormal contrast enhancement. A hyperintense T2 weighted signal collection that crosses the disc space and extends beyond the endplates is unchanged and shows persistent contrast-enhancement. An area of contrast enhancement within the spinal canal at the right aspect of the thecal sac has increased from the prior study (series 13, image 43). Contrast enhancement extends into the right neural foramen. Ventral epidural contrast enhancement at T10-T12 otherwise unchanged. Cord:  Normal caliber and signal of the  spinal cord. Paraspinal and other soft tissues: Prevertebral soft tissue inflammatory change without a well-defined fluid collection. Disc levels: There are small disc bulges at all levels below T5 but no high-grade spinal canal stenosis. IMPRESSION: 1. Unchanged appearance of bone marrow signal abnormalities and endplate irregularities at T11-T12 consistent with discitis-osteomyelitis. Partially contrast-enhancing collection involving both endplates and the disc space is unchanged and suggests intradiscal abscess. 2. Slight worsening of ventral epidural contrast enhancement at the T11-T12 levels, particularly along the right aspect of the thecal sac. Finding is concerning for developing epidural abscess. 3. Persistent inflammation of the prevertebral soft tissues  at the affected levels. Electronically Signed   By: Ulyses Jarred M.D.   On: 02/26/2017 02:15   Guadalupe Dawn, MD 02/26/2017, 4:58 AM PGY-1, [place] Intern pager: [phone], text pages welcome  Upper Level Addendum:  I have seen and evaluated this patient along with Dr. Kris Mouton and reviewed the above note, making necessary revisions in green.  Rogue Bussing, MD PGY-3,  [facility] 02/26/2017 5:13 AM

## 2017-02-26 NOTE — ED Notes (Signed)
Patient transported to MRI 

## 2017-02-27 DIAGNOSIS — F1910 Other psychoactive substance abuse, uncomplicated: Secondary | ICD-10-CM | POA: Diagnosis present

## 2017-02-27 DIAGNOSIS — G062 Extradural and subdural abscess, unspecified: Secondary | ICD-10-CM | POA: Diagnosis present

## 2017-02-27 DIAGNOSIS — M4644 Discitis, unspecified, thoracic region: Secondary | ICD-10-CM

## 2017-02-27 LAB — BASIC METABOLIC PANEL
Anion gap: 8 (ref 5–15)
BUN: 10 mg/dL (ref 6–20)
CHLORIDE: 97 mmol/L — AB (ref 101–111)
CO2: 29 mmol/L (ref 22–32)
CREATININE: 0.8 mg/dL (ref 0.61–1.24)
Calcium: 8.7 mg/dL — ABNORMAL LOW (ref 8.9–10.3)
GFR calc Af Amer: >60 mL/min (ref >60)
GFR calc non Af Amer: >60 mL/min (ref >60)
Glucose, Bld: 116 mg/dL — ABNORMAL HIGH (ref 65–99)
POTASSIUM: 3.8 mmol/L (ref 3.5–5.1)
SODIUM: 134 mmol/L — AB (ref 135–145)

## 2017-02-27 NOTE — Progress Notes (Addendum)
Subjective:  Still co back pain, asked why he is not getting IV abx  Antibiotics:  Anti-infectives    Start     Dose/Rate Route Frequency Ordered Stop   02/26/17 1130  fluconazole (DIFLUCAN) tablet 800 mg     800 mg Oral Daily 02/26/17 0950     02/26/17 0645  anidulafungin (ERAXIS) 200 mg in sodium chloride 0.9 % 200 mL IVPB  Status:  Discontinued     200 mg 78 mL/hr over 200 Minutes Intravenous  Once 02/26/17 5361 02/26/17 0950      Medications: Scheduled Meds: . buprenorphine-naloxone  1 tablet Sublingual BID  . cyclobenzaprine  10 mg Oral TID  . enoxaparin (LOVENOX) injection  40 mg Subcutaneous Q24H  . feeding supplement (ENSURE ENLIVE)  237 mL Oral BID BM  . fluconazole  800 mg Oral Daily  . folic acid  1 mg Oral Daily  . lisinopril  20 mg Oral Daily  . multivitamin with minerals  1 tablet Oral Daily  . polyethylene glycol  17 g Oral Daily  . thiamine  100 mg Oral Daily   Continuous Infusions: PRN Meds:.acetaminophen **OR** acetaminophen, albuterol, ibuprofen, LORazepam **OR** LORazepam, ondansetron **OR** ondansetron (ZOFRAN) IV    Objective: Weight change:   Intake/Output Summary (Last 24 hours) at 02/27/17 1213 Last data filed at 02/27/17 1000  Gross per 24 hour  Intake             2261 ml  Output              600 ml  Net             1661 ml   Blood pressure 134/89, pulse (!) 49, temperature 98.2 F (36.8 C), temperature source Oral, resp. rate 16, height 5\' 9"  (1.753 m), weight 219 lb (99.3 kg), SpO2 94 %. Temp:  [98 F (36.7 C)-98.5 F (36.9 C)] 98.2 F (36.8 C) (10/10 0051) Pulse Rate:  [49-57] 49 (10/10 0051) Resp:  [15-16] 16 (10/10 0051) BP: (124-146)/(79-98) 134/89 (10/10 0051) SpO2:  [94 %-98 %] 94 % (10/10 0051) Weight:  [218 lb 3.2 oz (99 kg)-219 lb (99.3 kg)] 219 lb (99.3 kg) (10/10 0051)  Physical Exam: General: Alert and awake, oriented x3 , calm CV: tachy Pulm: no wheezes or resp distress Neuro: nonfocal  CBC:  CBC  Latest Ref Rng & Units 02/26/2017 02/25/2017 02/21/2017  WBC 4.0 - 10.5 K/uL 6.2 6.4 6.9  Hemoglobin 13.0 - 17.0 g/dL 13.7 14.9 14.9  Hematocrit 39.0 - 52.0 % 40.0 42.1 44.0  Platelets 150 - 400 K/uL 273 288 328      BMET  Recent Labs  02/26/17 0653 02/27/17 0608  NA 134* 134*  K 4.3 3.8  CL 98* 97*  CO2 30 29  GLUCOSE 123* 116*  BUN 7 10  CREATININE 0.89 0.80  CALCIUM 8.9 8.7*     Liver Panel   Recent Labs  02/25/17 1439 02/26/17 0653  PROT 8.9* 7.7  ALBUMIN 3.3* 2.9*  AST 48* 42*  ALT 62 51  ALKPHOS 78 63  BILITOT 0.8 1.1       Sedimentation Rate  Recent Labs  02/25/17 2143  ESRSEDRATE 81*   C-Reactive Protein  Recent Labs  02/25/17 2143  CRP 3.0*    Micro Results: Recent Results (from the past 720 hour(s))  Urine C&S     Status: None   Collection Time: 02/06/17  5:50 PM  Result Value Ref Range Status  Specimen Description URINE, RANDOM  Final   Special Requests NONE  Final   Culture   Final    NO GROWTH Performed at [facility], [place] [address] [place], [place] [address]    Report Status 02/08/2017 FINAL  Final  Anaerobic culture     Status: None   Collection Time: 02/17/17 12:33 PM  Result Value Ref Range Status   Specimen Description FLUID  Final   Special Requests DISC T11 12  Final   Culture NO ANAEROBES ISOLATED  Final   Report Status 02/22/2017 FINAL  Final  Aerobic Culture (superficial specimen)     Status: None   Collection Time: 02/17/17 12:34 PM  Result Value Ref Range Status   Specimen Description FLUID  Final   Special Requests DISC T11 12  Final   Gram Stain   Final    ABUNDANT WBC PRESENT, PREDOMINANTLY PMN RARE BUDDING YEAST SEEN    Culture FEW CANDIDA ALBICANS  Final   Report Status 02/19/2017 FINAL  Final  Culture, fungus without smear     Status: Abnormal (Preliminary result)   Collection Time: 02/17/17 12:34 PM  Result Value Ref Range Status   Specimen Description FLUID  Final   Special Requests  DISC T11 12  Final   Culture CANDIDA ALBICANS (A)  Final   Report Status PENDING  Incomplete  Acid Fast Smear (AFB)     Status: None   Collection Time: 02/17/17 12:38 PM  Result Value Ref Range Status   AFB Specimen Processing Concentration  Final   Acid Fast Smear Negative  Final    Comment: (NOTE) Performed At: [facility] [place], [place] [id number] Lindon Romp MD ON:[id number]    Source (AFB) FLUID  Final    Comment: DISC T11 12  Culture, blood (Routine X 2) w Reflex to ID Panel     Status: None   Collection Time: 02/17/17  5:54 PM  Result Value Ref Range Status   Specimen Description BLOOD RIGHT ANTECUBITAL  Final   Special Requests   Final    BOTTLES DRAWN AEROBIC AND ANAEROBIC Blood Culture adequate volume   Culture NO GROWTH 5 DAYS  Final   Report Status 02/22/2017 FINAL  Final    Studies/Results: Mr Thoracic Spine W Wo Contrast  Result Date: 02/26/2017 CLINICAL DATA:  T11-12 osteomyelitis.  Worsening low back pain. EXAM: MRI THORACIC WITHOUT AND WITH CONTRAST TECHNIQUE: Multiplanar and multiecho pulse sequences of the thoracic spine were obtained without and with intravenous contrast. CONTRAST:  29mL MULTIHANCE GADOBENATE DIMEGLUMINE 529 MG/ML IV SOLN COMPARISON:  Thoracic spine MRI 02/18/2017 FINDINGS: MRI THORACIC SPINE FINDINGS Alignment:  Physiologic. Vertebrae: Persistent loss of normal T1 weighted signal at the T11 and T12 vertebral bodies with endplate irregularities and abnormal contrast enhancement. A hyperintense T2 weighted signal collection that crosses the disc space and extends beyond the endplates is unchanged and shows persistent contrast-enhancement. An area of contrast enhancement within the spinal canal at the right aspect of the thecal sac has increased from the prior study (series 13, image 43). Contrast enhancement extends into the right neural foramen. Ventral epidural contrast enhancement at T10-T12 otherwise unchanged. Cord:   Normal caliber and signal of the spinal cord. Paraspinal and other soft tissues: Prevertebral soft tissue inflammatory change without a well-defined fluid collection. Disc levels: There are small disc bulges at all levels below T5 but no high-grade spinal canal stenosis. IMPRESSION: 1. Unchanged appearance of bone marrow signal abnormalities and endplate  irregularities at T11-T12 consistent with discitis-osteomyelitis. Partially contrast-enhancing collection involving both endplates and the disc space is unchanged and suggests intradiscal abscess. 2. Slight worsening of ventral epidural contrast enhancement at the T11-T12 levels, particularly along the right aspect of the thecal sac. Finding is concerning for developing epidural abscess. 3. Persistent inflammation of the prevertebral soft tissues at the affected levels. Electronically Signed   By: Ulyses Jarred M.D.   On: 02/26/2017 02:15      Assessment/Plan:  INTERVAL HISTORY:  Seen by Neurosurgery again Active Problems:   Discitis thoracic region   Substance abuse ([place])    [name] is a 51 y.o. 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  #1 Fungal osteomyelitis with phlegmon: Appreciate Neurosurgery re-evlaluating him  WOULD ASK IR if they can possibly reach the epidural abscess to drain it and send for aerobic/anerobic and fungal cultures  continue FLUCONAZOLE to 800mg  a day   There is no reason to go with an echinocandin and if he is not going to be discharged to a skilled nursing facility.   #2  Fungal Endocarditis: see above  #3 IVDU: on suboxone     LOS: 1 day   Alcide Evener 02/27/2017, 12:13 PM

## 2017-02-27 NOTE — Progress Notes (Signed)
Nutrition Brief Note  Patient identified on the Malnutrition Screening Tool (MST) Report  Wt Readings from Last 15 Encounters:  02/27/17 219 lb (99.3 kg)  02/21/17 222 lb (100.7 kg)  01/28/17 220 lb (99.8 kg)  10/16/15 222 lb (100.7 kg)  10/12/15 220 lb (99.8 kg)  10/11/15 222 lb 14.4 oz (101.1 kg)  07/22/15 220 lb (99.8 kg)  01/12/15 225 lb (102.1 kg)  10/14/14 200 lb (90.7 kg)    Body mass index is 32.34 kg/m. Patient meets criteria for obesity unspecified based on current BMI.   Current diet order is Heart Healthy, patient is consuming approximately 100% of meals at this time. Labs and medications reviewed.   Noted pt is homeless, substance addiction. Noted poor intake of food x 2 days.   No nutrition interventions warranted at this time. If nutrition issues arise, please consult RD.   Kerman Passey MS, RD, LDN [phone] Pager  [phone] Weekend/On-Call Pager]

## 2017-02-27 NOTE — Clinical Social Work Note (Signed)
CSW acknowledges consult regarding patient's inability to afford medications. CSW will notify RNCM in morning progression meeting.  CSW signing off. Consult again if any social work needs arise.  [name], [place]

## 2017-02-27 NOTE — Progress Notes (Signed)
Family Medicine Teaching Service Daily Progress Note Intern Pager: [phone]  Patient name: [name] Medical record number: [id number] Date of birth: 11/28/1965 Age: 51 y.o. Gender: male  Primary Care Provider: Patient, No Pcp Per Consultants: infectious disease, neurosurgery Code Status: full  Pt Overview and Major Events to Date:  10/9 admitted, started on fluconazole, id consulted 10/10 neurosurgery consulted  Assessment and Plan: [name] is a 51 y/o readmission for T11-T12 discitis/osteomyelitis. 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 MRI with increased concern for epidural abscess. ID consulted who recommended continuing fluconazole, recommended neurosurgery consult.    T11/T12 Osteomyelitis/Discitis Patient with known osteomyelitis and discitis of T11-T12. MRI at this admission with slight worsening of ventral epidural abscess. Patient seen by ID who recommended we continue his fluconazole. Have consulted neurosurgery to evaluate for possible surgical intervention regarding epidural abscess. Expect that there will be no role given only minimally increased enhancement. If patient has no need for surgical intervention can likely dc on 10/11. - vitals per floor - cardiac monitoring - heart healthy diet as tolerated - scheduled suboxone and flexeril, which is his outpatient regimen - F/U ID recommendations - F/U neurosurgery recommendations - continue fluzonazole 800mg  daily - social work following to help with affordability of meds  Hx of Polysubstance Abuse:  Patient currently getting suboxone as outpatient. Per dr. Doristine Section last note he was to get 8mg  bid for 7 days starting 10/4. He had a follow up appointment scheduled for 10/12 with dr. Evette Doffing. - continue suboxone while inpatient - anticipate he will be discharged before appointment date  Hx of Tobacco Use Disorder.Patient has long history of tobacco use.  Currently smokes. Refused nicotine patch in ed. - consider starting nicotine patch if patient would like  Hx of Alcohol Use Disorder:Patient has history of heavy alcohol intake - CIWA protocol  OTR:RNHAFBX states he was diagnosed with CHF in prison recently. He states he is not currently taking any medications and was never started on any. He is not having any active signs or symptoms of CHF exacerbation at this time. ECHO 02/19/17 with EF of 50-55% - Consider starting him on optimal therapy.  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 Per patient report he did not pick up this medication so it is unlikely he took it, though was provided for free through [facility] program and delivered to patient prior to discharge.  - Continue lisinopril 20mg   Hepatitis C Diagnosed with Hep C at last admission. Hep C vrs RNA positive from 01/3017. Has not been to follow up appointment. Will appreciate ID recs regarding management of this. - follow up outpatient as scheduled  Hyponatremia Sodium of 134 10/10, unchanged from 10/9. Continue to follow.  FEN/GI: heart healthy PPx: lovenox   Disposition: likely back to hotel, pt is homeless  Subjective:  Patient is feeling better today. He says that his pain is well controlled. He was actually able to get some sleep overnight. Tolerating PO with no n/v.  Objective: Temp:  [98 F (36.7 C)-98.5 F (36.9 C)] 98.2 F (36.8 C) (10/10 0051) Pulse Rate:  [45-57] 49 (10/10 0051) Resp:  [15-19] 16 (10/10 0051) BP: (120-146)/(76-98) 134/89 (10/10 0051) SpO2:  [90 %-98 %] 94 % (10/10 0051) Weight:  [218 lb 3.2 oz (99 kg)-219 lb (99.3 kg)] 219 lb (99.3 kg) (10/10 0051) Physical Exam:  General: alert and oriented x3, no acute distress, AOx3 Cardiovascular: regular rate and rhythm, normal s1 and s2, no murmurs gallops,  palpable dp/pt bilaterally Respiratory: lungs clear to ausculation bilaterally, no wheezing, no crackles Abdomen: soft, non-tender, bowel sounds present Extremities: 5/5 strength BUE, BLE Back: Tender to palpation in lower back, some pain in back with extension of BLE  Laboratory:  Recent Labs Lab 02/21/17 0518 02/25/17 1439 02/26/17 0653  WBC 6.9 6.4 6.2  HGB 14.9 14.9 13.7  HCT 44.0 42.1 40.0  PLT 328 288 273    Recent Labs Lab 02/25/17 1439 02/26/17 0653 02/27/17 0608  NA 131* 134* 134*  K 4.5 4.3 3.8  CL 93* 98* 97*  CO2 29 30 29   BUN 8 7 10   CREATININE 0.81 0.89 0.80  CALCIUM 9.4 8.9 8.7*  PROT 8.9* 7.7  --   BILITOT 0.8 1.1  --   ALKPHOS 78 63  --   ALT 62 51  --   AST 48* 42*  --   GLUCOSE 120* 123* 116*    Imaging/Diagnostic Tests: Study Result   CLINICAL DATA:  T11-12 osteomyelitis.  Worsening low back pain.  EXAM: MRI THORACIC WITHOUT AND WITH CONTRAST  TECHNIQUE: Multiplanar and multiecho pulse sequences of the thoracic spine were obtained without and with intravenous contrast.  CONTRAST:  30mL MULTIHANCE GADOBENATE DIMEGLUMINE 529 MG/ML IV SOLN  COMPARISON:  Thoracic spine MRI 02/18/2017  FINDINGS: MRI THORACIC SPINE FINDINGS  Alignment:  Physiologic.  Vertebrae: Persistent loss of normal T1 weighted signal at the T11 and T12 vertebral bodies with endplate irregularities and abnormal contrast enhancement. A hyperintense T2 weighted signal collection that crosses the disc space and extends beyond the endplates is unchanged and shows persistent contrast-enhancement. An area of contrast enhancement within the spinal canal at the right aspect of the thecal sac has increased from the prior study (series 13, image 43). Contrast enhancement extends into the right neural foramen. Ventral epidural contrast enhancement at T10-T12 otherwise unchanged.  Cord:  Normal caliber and signal of the spinal cord.  Paraspinal and other soft  tissues: Prevertebral soft tissue inflammatory change without a well-defined fluid collection.  Disc levels: There are small disc bulges at all levels below T5 but no high-grade spinal canal stenosis.  IMPRESSION: 1. Unchanged appearance of bone marrow signal abnormalities and endplate irregularities at T11-T12 consistent with discitis-osteomyelitis. Partially contrast-enhancing collection involving both endplates and the disc space is unchanged and suggests intradiscal abscess. 2. Slight worsening of ventral epidural contrast enhancement at the T11-T12 levels, particularly along the right aspect of the thecal sac. Finding is concerning for developing epidural abscess. 3. Persistent inflammation of the prevertebral soft tissues at the affected levels.   Electronically Signed   By: Ulyses Jarred M.D    Guadalupe Dawn, MD 02/27/2017, 8:54 AM PGY-1, [place] Intern pager: [phone], text pages welcome
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
## 2017-02-27 NOTE — Care Management Note (Signed)
Case Management Note  Patient Details  Name: [name] MRN: [id number] Date of Birth: 04/01/1966  Subjective/Objective:   Discitis                Action/Plan: Patient recently discharged home and received Medication assistance through the [facility] 02/22/2017- 5 days ago which provides a 34 day supply of medication that was filled and given to the patient by previous CM prior to discharging home; CM will continue to follow for DCP; B Pennie Rushing   02/22/2017 Case manager contacted [facility] to arrange appointment, they are booked for several weeks. Case manager contacted [facility] [phone], scheduled patient followup appointment for Monday, March 11, 2017 at 2:30pm. Case manager will provide [facility] letter when patient ready for discharge. Glade Stanford RN CM   Expected Discharge Date:     Possibly 02/22/2017             Expected Discharge Plan:  Home/Self Care  In-House Referral:   SW  Discharge planning Services  CM Consult  Status of Service:  In process, will continue to follow  Sherrilyn Rist [phone] 02/27/2017, 9:29 AM

## 2017-02-28 DIAGNOSIS — I330 Acute and subacute infective endocarditis: Secondary | ICD-10-CM

## 2017-02-28 LAB — CBC
HEMATOCRIT: 39.6 % (ref 39.0–52.0)
Hemoglobin: 13.8 g/dL (ref 13.0–17.0)
MCH: 35.8 pg — AB (ref 26.0–34.0)
MCHC: 34.8 g/dL (ref 30.0–36.0)
MCV: 102.9 fL — AB (ref 78.0–100.0)
PLATELETS: 267 10*3/uL (ref 150–400)
RBC: 3.85 MIL/uL — ABNORMAL LOW (ref 4.22–5.81)
RDW: 12.9 % (ref 11.5–15.5)
WBC: 6.2 10*3/uL (ref 4.0–10.5)

## 2017-02-28 LAB — PROTIME-INR
INR: 1.09
Prothrombin Time: 14 seconds (ref 11.4–15.2)

## 2017-02-28 LAB — BASIC METABOLIC PANEL
Anion gap: 9 (ref 5–15)
BUN: 6 mg/dL (ref 6–20)
CALCIUM: 9.3 mg/dL (ref 8.9–10.3)
CO2: 30 mmol/L (ref 22–32)
CREATININE: 0.71 mg/dL (ref 0.61–1.24)
Chloride: 94 mmol/L — ABNORMAL LOW (ref 101–111)
GFR calc Af Amer: >60 mL/min (ref >60)
GFR calc non Af Amer: >60 mL/min (ref >60)
GLUCOSE: 118 mg/dL — AB (ref 65–99)
Potassium: 3.8 mmol/L (ref 3.5–5.1)
Sodium: 133 mmol/L — ABNORMAL LOW (ref 135–145)

## 2017-02-28 LAB — GLUCOSE, CAPILLARY
Glucose-Capillary: 109 mg/dL — ABNORMAL HIGH (ref 65–99)
Glucose-Capillary: 120 mg/dL — ABNORMAL HIGH (ref 65–99)
Glucose-Capillary: 163 mg/dL — ABNORMAL HIGH (ref 65–99)

## 2017-02-28 LAB — APTT: aPTT: 30 seconds (ref 24–36)

## 2017-02-28 MED ORDER — LISINOPRIL 20 MG PO TABS
20.0000 mg | ORAL_TABLET | Freq: Once | ORAL | Status: AC
  Administered 2017-02-28: 20 mg via ORAL
  Filled 2017-02-28: qty 1

## 2017-02-28 MED ORDER — LISINOPRIL 40 MG PO TABS
40.0000 mg | ORAL_TABLET | Freq: Every day | ORAL | Status: DC
  Administered 2017-03-01 – 2017-03-03 (×3): 40 mg via ORAL
  Filled 2017-02-28 (×3): qty 1

## 2017-02-28 MED ORDER — HYDRALAZINE HCL 20 MG/ML IJ SOLN
10.0000 mg | Freq: Three times a day (TID) | INTRAMUSCULAR | Status: DC | PRN
  Administered 2017-02-28: 10 mg via INTRAVENOUS
  Filled 2017-02-28: qty 1

## 2017-02-28 NOTE — Progress Notes (Signed)
Subjective:  Had multiple questions  Antibiotics:  Anti-infectives    Start     Dose/Rate Route Frequency Ordered Stop   02/26/17 1130  fluconazole (DIFLUCAN) tablet 800 mg     800 mg Oral Daily 02/26/17 0950     02/26/17 0645  anidulafungin (ERAXIS) 200 mg in sodium chloride 0.9 % 200 mL IVPB  Status:  Discontinued     200 mg 78 mL/hr over 200 Minutes Intravenous  Once 02/26/17 5188 02/26/17 0950      Medications: Scheduled Meds: . buprenorphine-naloxone  1 tablet Sublingual BID  . cyclobenzaprine  10 mg Oral TID  . feeding supplement (ENSURE ENLIVE)  237 mL Oral BID BM  . fluconazole  800 mg Oral Daily  . folic acid  1 mg Oral Daily  . [START ON 03/01/2017] lisinopril  40 mg Oral Daily  . multivitamin with minerals  1 tablet Oral Daily  . polyethylene glycol  17 g Oral Daily  . thiamine  100 mg Oral Daily   Continuous Infusions: PRN Meds:.acetaminophen **OR** acetaminophen, albuterol, hydrALAZINE, LORazepam **OR** LORazepam, ondansetron **OR** ondansetron (ZOFRAN) IV    Objective: Weight change: -3 lb 1.6 oz (-1.406 kg)  Intake/Output Summary (Last 24 hours) at 02/28/17 1652 Last data filed at 02/28/17 1548  Gross per 24 hour  Intake             1144 ml  Output              875 ml  Net              269 ml   Blood pressure (!) 164/94, pulse (!) 57, temperature 98.4 F (36.9 C), temperature source Oral, resp. rate 20, height 5\' 9"  (1.753 m), weight 215 lb 1.6 oz (97.6 kg), SpO2 97 %. Temp:  [98.2 F (36.8 C)-98.6 F (37 C)] 98.4 F (36.9 C) (10/11 1211) Pulse Rate:  [57-73] 57 (10/11 1211) Resp:  [20] 20 (10/11 1211) BP: (156-184)/(92-118) 164/94 (10/11 1211) SpO2:  [95 %-98 %] 97 % (10/11 1211) Weight:  [215 lb 1.6 oz (97.6 kg)] 215 lb 1.6 oz (97.6 kg) (10/11 0535)  Physical Exam: General: Alert and awake, oriented x3 , calm CV: tachy Pulm: no wheezes or resp distress Neuro: nonfocal  CBC:  CBC Latest Ref Rng & Units 02/28/2017 02/26/2017  02/25/2017  WBC 4.0 - 10.5 K/uL 6.2 6.2 6.4  Hemoglobin 13.0 - 17.0 g/dL 13.8 13.7 14.9  Hematocrit 39.0 - 52.0 % 39.6 40.0 42.1  Platelets 150 - 400 K/uL 267 273 288      BMET  Recent Labs  02/27/17 0608 02/28/17 0352  NA 134* 133*  K 3.8 3.8  CL 97* 94*  CO2 29 30  GLUCOSE 116* 118*  BUN 10 6  CREATININE 0.80 0.71  CALCIUM 8.7* 9.3     Liver Panel   Recent Labs  02/26/17 0653  PROT 7.7  ALBUMIN 2.9*  AST 42*  ALT 51  ALKPHOS 63  BILITOT 1.1       Sedimentation Rate  Recent Labs  02/25/17 2143  ESRSEDRATE 81*   C-Reactive Protein  Recent Labs  02/25/17 2143  CRP 3.0*    Micro Results: Recent Results (from the past 720 hour(s))  Urine C&S     Status: None   Collection Time: 02/06/17  5:50 PM  Result Value Ref Range Status   Specimen Description URINE, RANDOM  Final   Special Requests NONE  Final  Culture   Final    NO GROWTH Performed at [facility], [place] [address] [place], [place] [address]    Report Status 02/08/2017 FINAL  Final  Anaerobic culture     Status: None   Collection Time: 02/17/17 12:33 PM  Result Value Ref Range Status   Specimen Description FLUID  Final   Special Requests DISC T11 12  Final   Culture NO ANAEROBES ISOLATED  Final   Report Status 02/22/2017 FINAL  Final  Aerobic Culture (superficial specimen)     Status: None   Collection Time: 02/17/17 12:34 PM  Result Value Ref Range Status   Specimen Description FLUID  Final   Special Requests DISC T11 12  Final   Gram Stain   Final    ABUNDANT WBC PRESENT, PREDOMINANTLY PMN RARE BUDDING YEAST SEEN    Culture FEW CANDIDA ALBICANS  Final   Report Status 02/19/2017 FINAL  Final  Culture, fungus without smear     Status: Abnormal (Preliminary result)   Collection Time: 02/17/17 12:34 PM  Result Value Ref Range Status   Specimen Description FLUID  Final   Special Requests DISC T11 12  Final   Culture (A)  Final    CANDIDA ALBICANS Sent to [facility] for  further susceptibility testing.    Report Status PENDING  Incomplete  Acid Fast Smear (AFB)     Status: None   Collection Time: 02/17/17 12:38 PM  Result Value Ref Range Status   AFB Specimen Processing Concentration  Final   Acid Fast Smear Negative  Final    Comment: (NOTE) Performed At: [facility] [place], [place] [id number] Lindon Romp MD DX:[id number]    Source (AFB) FLUID  Final    Comment: DISC T11 12  Culture, blood (Routine X 2) w Reflex to ID Panel     Status: None   Collection Time: 02/17/17  5:54 PM  Result Value Ref Range Status   Specimen Description BLOOD RIGHT ANTECUBITAL  Final   Special Requests   Final    BOTTLES DRAWN AEROBIC AND ANAEROBIC Blood Culture adequate volume   Culture NO GROWTH 5 DAYS  Final   Report Status 02/22/2017 FINAL  Final  Culture, blood (routine x 2)     Status: None (Preliminary result)   Collection Time: 02/26/17  4:35 AM  Result Value Ref Range Status   Specimen Description BLOOD RIGHT HAND  Final   Special Requests   Final    BOTTLES DRAWN AEROBIC AND ANAEROBIC Blood Culture adequate volume   Culture NO GROWTH 2 DAYS  Final   Report Status PENDING  Incomplete  Culture, blood (routine x 2)     Status: None (Preliminary result)   Collection Time: 02/26/17  4:47 AM  Result Value Ref Range Status   Specimen Description BLOOD LEFT HAND  Final   Special Requests   Final    BOTTLES DRAWN AEROBIC AND ANAEROBIC Blood Culture adequate volume   Culture NO GROWTH 2 DAYS  Final   Report Status PENDING  Incomplete    Studies/Results: No results found.    Assessment/Plan:  INTERVAL HISTORY:  IR going to aspirate abscess Active Problems:   Discitis thoracic region   Substance abuse ([place])   Epidural abscess    [name] is a 51 y.o. 

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
## 2017-02-28 NOTE — Progress Notes (Signed)
Family Medicine Teaching Service Daily Progress Note Intern Pager: [phone]  Patient name: [name] Medical record number: [id number] Date of birth: 24-Feb-1966 Age: 51 y.o. Gender: male  Primary Care Provider: Patient, No Pcp Per Consultants: infectious disease, neurosurgery Code Status: full  Pt Overview and Major Events to Date:  10/9 admitted, started on fluconazole, id consulted 10/10 neurosurgery consulted, signed off 10/11 IR to evaluate for possible drainage  Assessment and Plan: [name] is a 52 y/o readmission for T11-T12 discitis/osteomyelitis. 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 MRI with increased concern for epidural abscess. ID consulted who recommended continuing fluconazole, recommended neurosurgery consult.  Neurosurgery evaluated, who felt they had nothing to offer surgically. Per ID recommendations, have asked IR to evaluate patient for possible drainage.  T11/T12 Osteomyelitis/Discitis Patient with known osteomyelitis and discitis of T11-T12. MRI at this admission with slight worsening of ventral epidural abscess. Patient seen by ID who recommended we continue his fluconazole. Neurosurgery evaluated 10/10 who felt that they did not have anything to offer in terms of advancing patient's care. Appreciate their recommendations. Per ID, have asked IR to evaluate patient with regards to possibly draining abscess. - vitals per floor - cardiac monitoring - heart healthy diet as tolerated - scheduled suboxone and flexeril, which is his outpatient regimen - F/U ID recommendations - F/U IR recommendations - continue fluzonazole 800mg  daily - social work following to help with affordability of meds  Hx of Polysubstance Abuse:  Patient currently getting suboxone as outpatient. Per dr. Doristine Section last note he was to get 8mg  bid for 7 days starting 10/4. He had a follow up appointment scheduled for 10/12 with dr.  Evette Doffing. - continue suboxone while inpatient - anticipate he will be discharged before appointment date  Hx of Tobacco Use Disorder.Patient has long history of tobacco use. Currently smokes. Refused nicotine patch in ed. - consider starting nicotine patch if patient would like  Hx of Alcohol Use Disorder:Patient has history of heavy alcohol intake - CIWA protocol  [id number] states he was diagnosed with CHF in prison recently. He states he is not currently taking any medications and was never started on any. He is not having any active signs or symptoms of CHF exacerbation at this time. ECHO 02/19/17 with EF of 50-55% - Consider starting him on optimal therapy.  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 Per patient report he did not pick up this medication so it is unlikely he took it, though was provided for free through [facility] program and delivered to patient prior to discharge. Patient with hypertension overnight on 10/10. Will consider increasing lisinopril to 40mg  daily if he continues to be hypertensive. Required one dose of hydralazine to keep sbp<170. Do not believe beta blocker is indicated given patient's bradycardia. - consider changing lisinopril to 40mg  daily - prn hydralazine 10mg  q 8 hours for sbp >170  Hepatitis C Diagnosed with Hep C at last admission. Hep C vrs RNA positive from 01/3017. Has not been to follow up appointment. Will appreciate ID recs regarding management of this. - follow up outpatient as scheduled  Hyponatremia Sodium of 134 10/10, unchanged from 10/9. Continue to follow.  FEN/GI: heart healthy PPx: lovenox   Disposition: likely back to hotel, pt is homeless  Subjective:  Patient is feeling better today. He says that his pain is well  controlled. He was actually able to get some sleep overnight.  Tolerating PO with no n/v.  Objective: Temp:  [98.1 F (36.7 C)-98.6 F (37 C)] 98.2 F (36.8 C) (10/11 0735) Pulse Rate:  [54-73] 73 (10/11 0735) Resp:  [20] 20 (10/11 0735) BP: (156-184)/(92-118) 168/92 (10/11 0735) SpO2:  [95 %-98 %] 95 % (10/11 0735) Weight:  [215 lb 1.6 oz (97.6 kg)] 215 lb 1.6 oz (97.6 kg) (10/11 0535) Physical Exam: General: alert and oriented x3, no acute distress, AOx3 Cardiovascular: rrr, normal s1 and s2, no m/r/g, palpable dp/pt bilaterally Respiratory: lungs clear to ausculation bilaterally, no wheezing, no crackles Abdomen: soft, non-tender, bowel sounds present Extremities: 5/5 strength BUE, BLE Back: Decreased tenderness to palpation in lower back, some pain in back with extension of BLE  Laboratory:  Recent Labs Lab 02/25/17 1439 02/26/17 0653 02/28/17 0352  WBC 6.4 6.2 6.2  HGB 14.9 13.7 13.8  HCT 42.1 40.0 39.6  PLT 288 273 267    Recent Labs Lab 02/25/17 1439 02/26/17 0653 02/27/17 0608 02/28/17 0352  NA 131* 134* 134* 133*  K 4.5 4.3 3.8 3.8  CL 93* 98* 97* 94*  CO2 29 30 29 30   BUN 8 7 10 6   CREATININE 0.81 0.89 0.80 0.71  CALCIUM 9.4 8.9 8.7* 9.3  PROT 8.9* 7.7  --   --   BILITOT 0.8 1.1  --   --   ALKPHOS 78 63  --   --   ALT 62 51  --   --   AST 48* 42*  --   --   GLUCOSE 120* 123* 116* 118*    Imaging/Diagnostic Tests: Study Result   CLINICAL DATA:  T11-12 osteomyelitis.  Worsening low back pain.  EXAM: MRI THORACIC WITHOUT AND WITH CONTRAST  TECHNIQUE: Multiplanar and multiecho pulse sequences of the thoracic spine were obtained without and with intravenous contrast.  CONTRAST:  4mL MULTIHANCE GADOBENATE DIMEGLUMINE 529 MG/ML IV SOLN  COMPARISON:  Thoracic spine MRI 02/18/2017  FINDINGS: MRI THORACIC SPINE FINDINGS  Alignment:  Physiologic.  Vertebrae: Persistent loss of normal T1 weighted signal at the T11 and T12 vertebral bodies with endplate irregularities and abnormal contrast  enhancement. A hyperintense T2 weighted signal collection that crosses the disc space and extends beyond the endplates is unchanged and shows persistent contrast-enhancement. An area of contrast enhancement within the spinal canal at the right aspect of the thecal sac has increased from the prior study (series 13, image 43). Contrast enhancement extends into the right neural foramen. Ventral epidural contrast enhancement at T10-T12 otherwise unchanged.  Cord:  Normal caliber and signal of the spinal cord.  Paraspinal and other soft tissues: Prevertebral soft tissue inflammatory change without a well-defined fluid collection.  Disc levels: There are small disc bulges at all levels below T5 but no high-grade spinal canal stenosis.  IMPRESSION: 1. Unchanged appearance of bone marrow signal abnormalities and endplate irregularities at T11-T12 consistent with discitis-osteomyelitis. Partially contrast-enhancing collection involving both endplates and the disc space is unchanged and suggests intradiscal abscess. 2. Slight worsening of ventral epidural contrast enhancement at the T11-T12 levels, particularly along the right aspect of the thecal sac. Finding is concerning for developing epidural abscess. 3. Persistent inflammation of the prevertebral soft tissues at the affected levels.   Electronically Signed   By: Ulyses Jarred M.D    Guadalupe Dawn, MD 02/28/2017, 8:39 AM PGY-1, [place] Intern pager: [phone], text pages welcome

## 2017-02-28 NOTE — Discharge Summary (Signed)
[facility] Discharge Summary  Patient name: [name] Medical record number: [id number] Date of birth: 12/26/1965 Age: 51 y.o. Gender: male Date of Admission: 02/25/2017  Date of Discharge: 10/14 Admitting Physician: Lind Covert, MD  Primary Care Provider: Patient, No Pcp Per Consultants: infectious disease, Interventional radiology, Neurosurgery  Indication for Hospitalization: T11-T12 discitis, osteomyelitis Low back pain Increasing inflammatory markers  Discharge Diagnoses/Problem List:  T11-T12 discitis, osteomyelitis Low back pain History of polysubstance abuse History of tobacco abuse History of alcohol abuse CHF Questionable mitral valve vegetation Hypertension Hepatitis C  Disposition: home  Discharge Condition: good  Discharge Exam: General: resting comfortably, no acute distress, aox3 Cardiovascular: RRR, normal s1 and s2, no murmurs appreciated Respiratory: Lungs clear to ausculation bilaterally, no wheezing, no crackles, normal WOB on RA Abdomen: Soft, non-tender, bowel sounds present Extremities: 5/5 strength  BUE, BLE Back: Minimal TTP of lower back, dressing clean and dry Skin: Warm and dry Neuro: A&Ox3 Psych: Appropriate mood and affect  Brief Hospital Course:  52 year old who presents as a re-admission on overnight 10/8 for T11-T12 osteomyelitis. At previous hospitalization he was cultured and grew out candida albicans. For full details from that admission please refer to that admission's discharge summary. Patient was admitted with increasing back pain, increasing inflammatory markers. He had an mri done at admission which showed a mostly stable MRI aside from increased enhancement concerning for more developed abscess. Patient received one dose of eraxis while in the emergency department. On 10/9 he was re-evaluated by ID who felt like he was ok to resume 800mg  oral fluconazole. He was restarted on home  pain regimen of flexeril and suboxone. Patient had scheduled follow up in [place] clinic on 10/12. Patient had continually decreased back pain while admitted. He was evaluated by Neurosurgery who felt like there was no role for neurosurgical intervention. Per Infectious disease recommendations Interventional radiology was consulted on 10/10 to evaluate if the patient's abscess could be drained for culture. Patient developed hypertension overnight. His home lisinopril of 20mg  was increased to 40mg  on 10/11. He underwent IR guided abscess drainage on 10/12. This culture once again grew out candida. He required 2 doses of dilaudid for the post-procedure pain on 10/13. He was weaned back down to suboxone on 10/13lm. He was felt to be medically ready for discharge on 10/14. He was discharged on 40mg  lisinopril, 15 flexeril pills, and 4 suboxone pills. All follow up appointments and medication dosing and times were reviewed with patient before he left.    Issues for Follow Up:  1. Follow up status of suboxone clinic 2. Follow up blood pressure on increased lisinopril 3. Monitor back pain  Significant Procedures: IR guided abscess drainage  Significant Labs and Imaging:   Recent Labs Lab 02/26/17 0653 02/28/17 0352 03/02/17 1013  WBC 6.2 6.2 6.2  HGB 13.7 13.8 13.3  HCT 40.0 39.6 39.0  PLT 273 267 258    Recent Labs Lab 02/25/17 1439 02/26/17 0653 02/27/17 0608 02/28/17 0352 03/02/17 1013 03/03/17 1125  NA 131* 134* 134* 133* 127* 131*  K 4.5 4.3 3.8 3.8 3.8 4.3  CL 93* 98* 97* 94* 91* 96*  CO2 29 30 29 30 29 27   GLUCOSE 120* 123* 116* 118* 128* 129*  BUN 8 7 10 6 13 11   CREATININE 0.81 0.89 0.80 0.71 0.87 0.71  CALCIUM 9.4 8.9 8.7* 9.3 8.7* 8.8*  ALKPHOS 78 63  --   --   --   --  AST 48* 42*  --   --   --   --   ALT 62 51  --   --   --   --   ALBUMIN 3.3* 2.9*  --   --   --   --     Results/Tests Pending at Time of Discharge: none  Discharge Medications:  Allergies as of  03/03/2017      Reactions   Penicillins    From childhood: Has patient had a PCN reaction causing immediate rash, facial/tongue/throat swelling, SOB or lightheadedness with hypotension: Unknown Has patient had a PCN reaction causing severe rash involving mucus membranes or skin necrosis: Unknown Has patient had a PCN reaction that required hospitalization: Unknown Has patient had a PCN reaction occurring within the last 10 years: No If all of the above answers are "NO", then may proceed with Cephalosporin use.      Medication List    STOP taking these medications   nicotine 21 mg/24hr patch Commonly known as:  EQ NICOTINE     TAKE these medications   acetaminophen 325 MG tablet Commonly known as:  TYLENOL Take 1 tablet (325 mg total) by mouth every 6 (six) hours as needed. What changed:  reasons to take this   albuterol 108 (90 Base) MCG/ACT inhaler Commonly known as:  PROVENTIL HFA;VENTOLIN HFA Inhale 2 puffs into the lungs every 6 (six) hours as needed for wheezing or shortness of breath.   buprenorphine-naloxone 8-2 mg Subl SL tablet Commonly known as:  SUBOXONE Place 1 tablet under the tongue 2 (two) times daily. What changed:  when to take this   cyclobenzaprine 10 MG tablet Commonly known as:  FLEXERIL Take 1 tablet (10 mg total) by mouth 2 (two) times daily. What changed:  when to take this   fluconazole 200 MG tablet Commonly known as:  DIFLUCAN Take 4 tablets (800 mg total) by mouth daily. What changed:  Another medication with the same name was removed. Continue taking this medication, and follow the directions you see here.   folic acid 1 MG tablet Commonly known as:  FOLVITE Take 1 tablet (1 mg total) by mouth daily.   guaiFENesin-dextromethorphan 100-10 MG/5ML syrup Commonly known as:  ROBITUSSIN DM Take 5 mLs by mouth every 4 (four) hours as needed for cough.   lisinopril 40 MG tablet Commonly known as:  PRINIVIL,ZESTRIL Take 1 tablet (40 mg total)  by mouth daily. What changed:  medication strength  how much to take   multivitamin with minerals Tabs tablet Take 1 tablet by mouth daily.   polyethylene glycol packet Commonly known as:  MIRALAX / GLYCOLAX Take 17 g by mouth daily.   thiamine 100 MG tablet Take 1 tablet (100 mg total) by mouth daily.       Discharge Instructions: Please refer to Patient Instructions section of EMR for full details.  Patient was counseled important signs and symptoms that should prompt return to medical care, changes in medications, dietary instructions, activity restrictions, and follow up appointments.   Follow-Up Appointments: Follow-up Information    Axel Filler, MD Follow up on 03/05/2017.   Specialty:  Internal Medicine Why:  please arrive by 8:15 for your 8:30 appointment Contact information: [place] [address] [phone]        Clent Demark, PA-C Follow up on 03/11/2017.   Specialty:  Physician Assistant Why:  Please arrive by 2:15 for your 2:30 appointmnet Contact information: [place] [address] [place], Lavell Islam, MD Follow up on 03/18/2017.   Specialty:  Infectious Diseases Why:  Please arrive by 2:15 for your 2:30 appointment Contact information: [address] [address] [phone]           Guadalupe Dawn, MD 03/03/2017, 4:21 PM PGY-1, [place]

## 2017-02-28 NOTE — Progress Notes (Signed)
Patient has consistently high BP tonight, paged MD for PRN orders.

## 2017-03-01 ENCOUNTER — Ambulatory Visit: Payer: Self-pay

## 2017-03-01 ENCOUNTER — Encounter (HOSPITAL_COMMUNITY): Payer: Self-pay | Admitting: Family Medicine

## 2017-03-01 ENCOUNTER — Inpatient Hospital Stay (HOSPITAL_COMMUNITY): Payer: Self-pay

## 2017-03-01 DIAGNOSIS — G062 Extradural and subdural abscess, unspecified: Principal | ICD-10-CM

## 2017-03-01 HISTORY — PX: IR FLUORO GUIDED NEEDLE PLC ASPIRATION/INJECTION LOC: IMG2395

## 2017-03-01 MED ORDER — MIDAZOLAM HCL 2 MG/2ML IJ SOLN
INTRAMUSCULAR | Status: AC | PRN
  Administered 2017-03-01 (×3): 1 mg via INTRAVENOUS

## 2017-03-01 MED ORDER — HYDRALAZINE HCL 20 MG/ML IJ SOLN
INTRAMUSCULAR | Status: AC
  Filled 2017-03-01: qty 1

## 2017-03-01 MED ORDER — MIDAZOLAM HCL 2 MG/2ML IJ SOLN
INTRAMUSCULAR | Status: AC
  Filled 2017-03-01: qty 2

## 2017-03-01 MED ORDER — BUPIVACAINE HCL (PF) 0.5 % IJ SOLN
INTRAMUSCULAR | Status: AC | PRN
  Administered 2017-03-01: 20 mL

## 2017-03-01 MED ORDER — FENTANYL CITRATE (PF) 100 MCG/2ML IJ SOLN
INTRAMUSCULAR | Status: AC | PRN
  Administered 2017-03-01 (×3): 25 ug via INTRAVENOUS

## 2017-03-01 MED ORDER — HYDROMORPHONE HCL 1 MG/ML IJ SOLN
INTRAMUSCULAR | Status: AC
  Filled 2017-03-01: qty 0.5

## 2017-03-01 MED ORDER — HYDROMORPHONE HCL 1 MG/ML IJ SOLN
INTRAMUSCULAR | Status: AC
  Filled 2017-03-01: qty 1

## 2017-03-01 MED ORDER — SODIUM CHLORIDE 0.9 % IV SOLN
INTRAVENOUS | Status: AC
  Administered 2017-03-01: 11:00:00 via INTRAVENOUS

## 2017-03-01 MED ORDER — HYDROMORPHONE HCL 1 MG/ML IJ SOLN
INTRAMUSCULAR | Status: AC | PRN
  Administered 2017-03-01 (×2): 1 mg via INTRAVENOUS

## 2017-03-01 MED ORDER — BUPIVACAINE HCL (PF) 0.5 % IJ SOLN
INTRAMUSCULAR | Status: AC
Start: 2017-03-01 — End: 2017-03-01
  Filled 2017-03-01: qty 30

## 2017-03-01 MED ORDER — HYDROMORPHONE HCL 1 MG/ML IJ SOLN
1.0000 mg | INTRAMUSCULAR | Status: DC | PRN
  Administered 2017-03-02: 1 mg via INTRAVENOUS
  Filled 2017-03-01: qty 1

## 2017-03-01 MED ORDER — SODIUM CHLORIDE 0.9 % IV SOLN
INTRAVENOUS | Status: AC | PRN
  Administered 2017-03-01: 10 mL/h via INTRAVENOUS

## 2017-03-01 MED ORDER — FENTANYL CITRATE (PF) 100 MCG/2ML IJ SOLN
INTRAMUSCULAR | Status: AC
  Filled 2017-03-01: qty 2

## 2017-03-01 NOTE — Sedation Documentation (Signed)
Patient is resting comfortably. 

## 2017-03-01 NOTE — Progress Notes (Signed)
Received patient from IR post  Aspiration of T11-T12. Aspiration site  dre4ssing DCI.Patient alert but still sleepy/drowsy. Will monitor

## 2017-03-01 NOTE — Sedation Documentation (Signed)
Patient restless 

## 2017-03-01 NOTE — Progress Notes (Signed)
Family Medicine Teaching Service Daily Progress Note Intern Pager: [phone]  Patient name: [name] Medical record number: [id number] Date of birth: 12/10/1965 Age: 51 y.o. Gender: male  Primary Care Provider: Patient, No Pcp Per Consultants: infectious disease, neurosurgery Code Status: full  Pt Overview and Major Events to Date:  10/9 admitted, started on fluconazole, id consulted 10/10 neurosurgery consulted, signed off 10/11 IR to evaluate for possible drainage 10/12 IR performed fluro guided aspiration  Assessment and Plan: [name] is a 51 y/o readmission for T11-T12 discitis/osteomyelitis. 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 MRI with increased concern for epidural abscess. ID consulted who recommended continuing fluconazole, recommended neurosurgery consult.  Neurosurgery evaluated, who felt they had nothing to offer surgically. Per ID recommendations, have asked IR to evaluate patient for possible drainage.  T11/T12 Osteomyelitis/Discitis Patient with known osteomyelitis and discitis of T11-T12. MRI at this admission with slight worsening of ventral epidural abscess. Patient seen by ID who recommended we continue his fluconazole.  Patient to undergo IR guided abscess aspiration. Will get anaerobic/aerobic/fungal cultures. - vitals per floor - cardiac monitoring - heart healthy diet as tolerated - scheduled suboxone and flexeril, which is his outpatient regimen - f/u aerobic, anaerobic, and fungal cultures - F/U ID recommendations - F/U IR recommendations - continue fluzonazole 800mg  daily - social work following to help with affordability of meds  Hx of Polysubstance Abuse:  Patient currently getting suboxone as outpatient. Per dr. Doristine Section last note he was to get 8mg  bid for 7 days starting 10/4. He had a follow up appointment scheduled for 10/12 with dr. Evette Doffing. Have contacted Dr. Evette Doffing through epic and  made provider aware patient will miss appointment due to inpatient status. - continue suboxone while inpatient - will need to reschedule appointment for suboxone  Hx of Tobacco Use Disorder.Patient has long history of tobacco use. Currently smokes. Refused nicotine patch in ed. - consider starting nicotine patch if patient would like  Hx of Alcohol Use Disorder:Patient has history of heavy alcohol intake - CIWA protocol  [id number] states he was diagnosed with CHF in prison recently. He states he is not currently taking any medications and was never started on any. He is not having any active signs or symptoms of CHF exacerbation at this time. ECHO 02/19/17 with EF of 50-55% - Consider starting him on optimal therapy.  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 Per patient report he did not pick up this medication so it is unlikely he took it, though was provided for free through [facility] program and delivered to patient prior to discharge. Patient with hypertension overnight on 10/10. Will consider increasing lisinopril to 40mg  daily if he continues to be hypertensive. Required one dose of hydralazine to keep sbp<170. Do not believe beta blocker is indicated given patient's bradycardia. - continue lisinopril to 40mg  daily - prn hydralazine 10mg  q 8 hours for sbp >170  Hepatitis C Diagnosed with Hep C at last admission. Hep C vrs RNA positive from 01/3017. Has not been to follow up appointment. Will appreciate ID recs regarding management of this. - follow up outpatient as scheduled  Hyponatremia Sodium of 133 on 10/11, has been very stable this admission. Will continue to follow.  FEN/GI: heart healthy PPx: lovenox   Disposition: likely back to hotel, pt is homeless  Subjective:  Patient is  feeling better today. He says that his pain is  well controlled. He was actually able to get some sleep overnight. Tolerating PO with no n/v.  Objective: Temp:  [98.4 F (36.9 C)-98.9 F (37.2 C)] 98.8 F (37.1 C) (10/12 0557) Pulse Rate:  [57-83] 69 (10/12 1042) Resp:  [15-24] 16 (10/12 1038) BP: (141-180)/(84-121) 151/98 (10/12 1042) SpO2:  [93 %-99 %] 98 % (10/12 1038) Weight:  [211 lb 4.8 oz (95.8 kg)] 211 lb 4.8 oz (95.8 kg) (10/12 0557) Physical Exam: General: alert and oriented x3, no acute distress, AOx3 Cardiovascular: rrr, normal s1 and s2, no m/r/g, palpable dp/pt bilaterally Respiratory: lungs clear to ausculation bilaterally, no wheezing, no crackles Abdomen: soft, non-tender, bowel sounds present Extremities: 5/5 strength BUE, BLE Back: decreased tenderness to lower back, no pain with extension of legs today  Laboratory:  Recent Labs Lab 02/25/17 1439 02/26/17 0653 02/28/17 0352  WBC 6.4 6.2 6.2  HGB 14.9 13.7 13.8  HCT 42.1 40.0 39.6  PLT 288 273 267    Recent Labs Lab 02/25/17 1439 02/26/17 0653 02/27/17 0608 02/28/17 0352  NA 131* 134* 134* 133*  K 4.5 4.3 3.8 3.8  CL 93* 98* 97* 94*  CO2 29 30 29 30   BUN 8 7 10 6   CREATININE 0.81 0.89 0.80 0.71  CALCIUM 9.4 8.9 8.7* 9.3  PROT 8.9* 7.7  --   --   BILITOT 0.8 1.1  --   --   ALKPHOS 78 63  --   --   ALT 62 51  --   --   AST 48* 42*  --   --   GLUCOSE 120* 123* 116* 118*    Imaging/Diagnostic Tests: Study Result   CLINICAL DATA:  T11-12 osteomyelitis.  Worsening low back pain.  EXAM: MRI THORACIC WITHOUT AND WITH CONTRAST  TECHNIQUE: Multiplanar and multiecho pulse sequences of the thoracic spine were obtained without and with intravenous contrast.  CONTRAST:  28mL MULTIHANCE GADOBENATE DIMEGLUMINE 529 MG/ML IV SOLN  COMPARISON:  Thoracic spine MRI 02/18/2017  FINDINGS: MRI THORACIC SPINE FINDINGS  Alignment:  Physiologic.  Vertebrae: Persistent loss of normal T1 weighted signal at the T11 and T12 vertebral bodies  with endplate irregularities and abnormal contrast enhancement. A hyperintense T2 weighted signal collection that crosses the disc space and extends beyond the endplates is unchanged and shows persistent contrast-enhancement. An area of contrast enhancement within the spinal canal at the right aspect of the thecal sac has increased from the prior study (series 13, image 43). Contrast enhancement extends into the right neural foramen. Ventral epidural contrast enhancement at T10-T12 otherwise unchanged.  Cord:  Normal caliber and signal of the spinal cord.  Paraspinal and other soft tissues: Prevertebral soft tissue inflammatory change without a well-defined fluid collection.  Disc levels: There are small disc bulges at all levels below T5 but no high-grade spinal canal stenosis.  IMPRESSION: 1. Unchanged appearance of bone marrow signal abnormalities and endplate irregularities at T11-T12 consistent with discitis-osteomyelitis. Partially contrast-enhancing collection involving both endplates and the disc space is unchanged and suggests intradiscal abscess. 2. Slight worsening of ventral epidural contrast enhancement at the T11-T12 levels, particularly along the right aspect of the thecal sac. Finding is concerning for developing epidural abscess. 3. Persistent inflammation of the prevertebral soft tissues at the affected levels.   Electronically Signed   By: Ulyses Jarred M.D    Guadalupe Dawn, MD 03/01/2017, 11:16 AM PGY-1, [place] Intern pager: [phone], text pages welcome

## 2017-03-01 NOTE — Progress Notes (Signed)
Patient did well throughout the night, less agitated. no complaints or concerns, but has been NPO since midnight.

## 2017-03-01 NOTE — Procedures (Signed)
S/P T11 T12  Fluoro guided disc aspiration .

## 2017-03-02 DIAGNOSIS — M869 Osteomyelitis, unspecified: Secondary | ICD-10-CM

## 2017-03-02 LAB — CBC
HCT: 39 % (ref 39.0–52.0)
Hemoglobin: 13.3 g/dL (ref 13.0–17.0)
MCH: 35.3 pg — AB (ref 26.0–34.0)
MCHC: 34.1 g/dL (ref 30.0–36.0)
MCV: 103.4 fL — ABNORMAL HIGH (ref 78.0–100.0)
PLATELETS: 258 10*3/uL (ref 150–400)
RBC: 3.77 MIL/uL — AB (ref 4.22–5.81)
RDW: 13.4 % (ref 11.5–15.5)
WBC: 6.2 10*3/uL (ref 4.0–10.5)

## 2017-03-02 LAB — BASIC METABOLIC PANEL
Anion gap: 7 (ref 5–15)
BUN: 13 mg/dL (ref 6–20)
CALCIUM: 8.7 mg/dL — AB (ref 8.9–10.3)
CHLORIDE: 91 mmol/L — AB (ref 101–111)
CO2: 29 mmol/L (ref 22–32)
CREATININE: 0.87 mg/dL (ref 0.61–1.24)
GFR calc non Af Amer: >60 mL/min (ref >60)
Glucose, Bld: 128 mg/dL — ABNORMAL HIGH (ref 65–99)
Potassium: 3.8 mmol/L (ref 3.5–5.1)
SODIUM: 127 mmol/L — AB (ref 135–145)

## 2017-03-02 MED ORDER — BUPRENORPHINE HCL-NALOXONE HCL 8-2 MG SL SUBL
1.0000 | SUBLINGUAL_TABLET | Freq: Two times a day (BID) | SUBLINGUAL | Status: DC
  Administered 2017-03-02 – 2017-03-03 (×3): 1 via SUBLINGUAL
  Filled 2017-03-02 (×3): qty 1

## 2017-03-02 NOTE — Progress Notes (Signed)
Family Medicine Teaching Service Daily Progress Note Intern Pager: [phone]  Patient name: [name] Medical record number: [id number] Date of birth: [birth year] Age: 51 y.o. Gender: male  Primary Care Provider: Patient, No Pcp Per Consultants: Infectious disease, neurosurgery (signed off), IR Code Status: Full  Pt Overview and Major Events to Date:  10/9 Admitted, started on fluconazole, ID consulted 10/10 Neurosurgery consulted, signed off 10/11 IR to evaluate for possible drainage 10/12 IR performed fluro guided aspiration 10/13 Febrile to 100.27F  Assessment and Plan: [name] is a 51 y/o with PMH of substance abuse (heroin, cocaine), COPD, CHF, pulmonary HTN, and osteomyelitis admitted for T11-T12 discitis/osteomyelitis.   T11/T12 Osteomyelitis/Discitis: Patient with known osteomyelitis and discitis of T11-T12. MRI at this admission with slight worsening of ventral epidural abscess. Patient seen by ID who recommended we continue his fluconazole. IR performed abscess aspiration 10/12. Dilaudid resumed as patient with significant increase in pain after aspiration. Temp 100.27F overnight, however afebrile this AM at 98.58F.  - Vitals per floor - Discontinue telemetry - Dilaudid and Flexeril PRN - Discontinue Dilaudid and resume home suboxone once pain improved - F/u aerobic, anaerobic, and fungal cultures - ID consulted - appreciate recommendations - IR consulted - appreciate recommendations - Continue fluzonazole 800mg  daily - Social work following to help with affordability of meds - CBC pending   Hx of Polysubstance Abuse: Patient currently getting suboxone as outpatient. Per Dr. Doristine Section last note he was to get 8mg  bid for 7 days starting 10/4. He had a follow up appointment scheduled for 10/12 with dr. Evette Doffing. Have contacted Dr. Evette Doffing through epic and made provider aware patient will miss appointment due to inpatient status. Suboxone discontinued 10/12 and  Dilaudid resumed due to increased pain after aspiration.  - Continue Dilaudid and Flexeril PRN for now until pain improves - Discontinue Dilaudid and resume suboxone when able - Will need to reschedule appointment with Dr. Evette Doffing for suboxone management upon discharge  Hx of Tobacco Use Disorder.Patient has long history of tobacco use. Currently smokes.  - Consider starting nicotine patch if patient requests  Hx of Alcohol Use Disorder:Patient has history of heavy alcohol intake. CIWA scores 5, 5 overnight.  - CIWA protocol  CHE:NIDPOEU states he was diagnosed with CHF in prison recently. He states he is not currently taking any medications and was never started on any. He is not having any active signs or symptoms of CHF exacerbation at this time. ECHO 02/19/17 with EF of 50-55%. Weight down 1 pound from admission (215 lb>214lb today).  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 - Continue fluconazole 800mg  for 6 months to 1 year  Hypertension: Patient prescribed lisinopril as outpatient at last admission. Per patient report he did not pick up this medication so it is unlikely he took it, though was provided for free through [facility] program and delivered to patient prior to discharge. BPs overnight 142/98, 126/83, possibly elevated due to increased pain after IR aspiration.  - Continue lisinopril 40mg  qd - Hydralazine PRN SBP >170  Hepatitis C: Diagnosed with Hep C at last admission. Hep C vrs RNA positive from 01/3017. Has not been to follow up appointment.  - ID following - appreciation recs - Follow up outpatient as scheduled  Hyponatremia: Sodium of 133 on 10/11.  - BMP pending   FEN/GI: heart healthy PPx: lovenox   Disposition: Pending medical improvement   Subjective:  Reporting severe pain today after aspiration yesterday.  Reports that he has not received Dilaudid in a very long time (in actuality received  immediately prior to my entering the room) and is asking for something stronger. Says he is only being given Flexeril which does not help his pain.   Objective: Temp:  [98 F (36.7 C)-100.4 F (38 C)] 98.3 F (36.8 C) (10/13 0909) Pulse Rate:  [66-83] 78 (10/13 0909) Resp:  [15-24] 20 (10/13 0539) BP: (123-180)/(83-121) 126/83 (10/13 0909) SpO2:  [94 %-99 %] 95 % (10/13 0909) Weight:  [214 lb 4.8 oz (97.2 kg)] 214 lb 4.8 oz (97.2 kg) (10/13 0539) Physical Exam: General: Sitting at bedside bathing, well-appearing, NAD  Cardiovascular: RRR, normal s1 and s2, no murmurs appreciated Respiratory: Lungs clear to ausculation bilaterally, no wheezing, no crackles, normal WOB on RA Abdomen: Soft, non-tender, bowel sounds present Extremities: 5/5 strength  BUE, BLE Back: Minimal TTP of lower back, dressing clean and dry Skin: Warm and dry Neuro: A&Ox3 Psych: Appropriate mood and affect   Laboratory:  Recent Labs Lab 02/25/17 1439 02/26/17 0653 02/28/17 0352  WBC 6.4 6.2 6.2  HGB 14.9 13.7 13.8  HCT 42.1 40.0 39.6  PLT 288 273 267    Recent Labs Lab 02/25/17 1439 02/26/17 0653 02/27/17 0608 02/28/17 0352  NA 131* 134* 134* 133*  K 4.5 4.3 3.8 3.8  CL 93* 98* 97* 94*  CO2 29 30 29 30   BUN 8 7 10 6   CREATININE 0.81 0.89 0.80 0.71  CALCIUM 9.4 8.9 8.7* 9.3  PROT 8.9* 7.7  --   --   BILITOT 0.8 1.1  --   --   ALKPHOS 78 63  --   --   ALT 62 51  --   --   AST 48* 42*  --   --   GLUCOSE 120* 123* 116* 118*    Imaging/Diagnostic Tests: Study Result   CLINICAL DATA:  T11-12 osteomyelitis.  Worsening low back pain.  EXAM: MRI THORACIC WITHOUT AND WITH CONTRAST  TECHNIQUE: Multiplanar and multiecho pulse sequences of the thoracic spine were obtained without and with intravenous contrast.  CONTRAST:  83mL MULTIHANCE GADOBENATE DIMEGLUMINE 529 MG/ML IV SOLN  COMPARISON:  Thoracic spine MRI 02/18/2017  FINDINGS: MRI THORACIC SPINE FINDINGS  Alignment:   Physiologic.  Vertebrae: Persistent loss of normal T1 weighted signal at the T11 and T12 vertebral bodies with endplate irregularities and abnormal contrast enhancement. A hyperintense T2 weighted signal collection that crosses the disc space and extends beyond the endplates is unchanged and shows persistent contrast-enhancement. An area of contrast enhancement within the spinal canal at the right aspect of the thecal sac has increased from the prior study (series 13, image 43). Contrast enhancement extends into the right neural foramen. Ventral epidural contrast enhancement at T10-T12 otherwise unchanged.  Cord:  Normal caliber and signal of the spinal cord.  Paraspinal and other soft tissues: Prevertebral soft tissue inflammatory change without a well-defined fluid collection.  Disc levels: There are small disc bulges at all levels below T5 but no high-grade spinal canal stenosis.  IMPRESSION: 1. Unchanged appearance of bone marrow signal abnormalities and endplate irregularities at T11-T12 consistent with discitis-osteomyelitis. Partially contrast-enhancing collection involving both endplates and the disc space is unchanged and suggests intradiscal abscess. 2. Slight worsening of ventral epidural contrast enhancement at the T11-T12 levels, particularly along the right aspect of the thecal sac. Finding is concerning for developing epidural abscess. 3. Persistent inflammation of the prevertebral soft tissues at the affected levels.   Electronically Signed  By: Ulyses Jarred M.D    Verner Mould, MD 03/02/2017, 9:15 AM PGY-3, [place] Intern pager: [phone], text pages welcome

## 2017-03-02 NOTE — Plan of Care (Signed)
Problem: Pain Managment: Goal: General experience of comfort will improve Outcome: Progressing Back pain improving per patient   Problem: Tissue Perfusion: Goal: Risk factors for ineffective tissue perfusion will decrease Outcome: Progressing Oxygen saturation remains in the 90's on room air

## 2017-03-02 NOTE — Progress Notes (Signed)
Patient stable during 7 a to 7 p shift, VSS, temp WNL this shift.  Patient did complain of back pain x 2 this shift, Dilaudid given 0900 and Flexeril in the afternoon.  Patient ambulatory to bathroom with standby assist from staff.

## 2017-03-03 LAB — BASIC METABOLIC PANEL
ANION GAP: 8 (ref 5–15)
BUN: 11 mg/dL (ref 6–20)
CALCIUM: 8.8 mg/dL — AB (ref 8.9–10.3)
CO2: 27 mmol/L (ref 22–32)
Chloride: 96 mmol/L — ABNORMAL LOW (ref 101–111)
Creatinine, Ser: 0.71 mg/dL (ref 0.61–1.24)
Glucose, Bld: 129 mg/dL — ABNORMAL HIGH (ref 65–99)
Potassium: 4.3 mmol/L (ref 3.5–5.1)
SODIUM: 131 mmol/L — AB (ref 135–145)

## 2017-03-03 LAB — CULTURE, BLOOD (ROUTINE X 2)
CULTURE: NO GROWTH
Culture: NO GROWTH
SPECIAL REQUESTS: ADEQUATE
Special Requests: ADEQUATE

## 2017-03-03 MED ORDER — SODIUM CHLORIDE 1 G PO TABS
1.0000 g | ORAL_TABLET | Freq: Three times a day (TID) | ORAL | Status: DC
  Filled 2017-03-03: qty 1

## 2017-03-03 MED ORDER — CYCLOBENZAPRINE HCL 10 MG PO TABS: 10.0000 mg | ORAL_TABLET | Freq: Two times a day (BID) | ORAL | 0 refills | Status: DC

## 2017-03-03 MED ORDER — BUPRENORPHINE HCL-NALOXONE HCL 8-2 MG SL SUBL: 1.0000 | SUBLINGUAL_TABLET | Freq: Two times a day (BID) | SUBLINGUAL | 0 refills | Status: DC

## 2017-03-03 MED ORDER — LISINOPRIL 40 MG PO TABS: 40.0000 mg | ORAL_TABLET | Freq: Every day | ORAL | 0 refills | Status: DC

## 2017-03-03 NOTE — Plan of Care (Signed)
Problem: Physical Regulation: Goal: Will remain free from infection Outcome: Adequate for Discharge Has follow-up with ID

## 2017-03-03 NOTE — Discharge Instructions (Signed)
You are being discharged with the diagnosis of a fungal infection in your back bone and disk in your spine This diagnosis has not changed since your last discharge Please follow up with Dr. Evette Doffing with the internal medicine clinic on 10/16 at 8:30 for suboxone management Please follow up with Roderic Ovens PA on 10/22 at 2:30 for hospital follow up Please follow up with Dr. Tommy Medal with infectious disease on 10/29 at 2:30 for surveillance of your spine infection You are being discharged with the following changes to your medications You are to take lisinopril 40mg  daily You are to take fluconazole 800mg  daily (this will be 4 200mg  pills, all at once)

## 2017-03-03 NOTE — Progress Notes (Signed)
Family Medicine progress note  Patient able to arrange ride and wants to leave hospital. Discussed management of his condition and changes to his medications at this hospitalization in great detail with patient. Also went over upcoming followup appointments with internal medicine for suboxone, pcp for hospital follow up and to establish care, and appointment with dr. Lucianne Lei dam for T11-T12 surveillance. Made sure that patient understands that he is to take fluconazole 800mg  daily, I.E all four 200mg  pills at once daily. Made patient take and pass verbal quiz to ensure that he understood this information. He has no questions and is ready for discharge. Gave him a prescription for 4 pills of suboxone, which is only enough to last until his scheduled appointment.   Guadalupe Dawn MD PGY-1 Family Medicine Resident

## 2017-03-03 NOTE — Progress Notes (Signed)
Discharge instructions reviewed with patient, questions answered, verbaliuzed understanding.  Patient ambulatory from unit to be taken home by friend.

## 2017-03-03 NOTE — Progress Notes (Signed)
Family Medicine Teaching Service Daily Progress Note Intern Pager: [phone]  Patient name: [name] Medical record number: [id number] Date of birth: 06/03/1965 Age: 51 y.o. Gender: male  Primary Care Provider: Patient, No Pcp Per Consultants: Infectious disease, neurosurgery (signed off), IR Code Status: Full  Pt Overview and Major Events to Date:  10/9 Admitted, started on fluconazole, ID consulted 10/10 Neurosurgery consulted, signed off 10/11 IR to evaluate for possible drainage 10/12 IR performed fluro guided aspiration 10/13 Febrile to 100.69F   Assessment and Plan: [name] is a 51 y/o with PMH of substance abuse (heroin, cocaine), COPD, CHF, pulmonary HTN, and osteomyelitis admitted for T11-T12 discitis/osteomyelitis. Patient evaluated by neurosurgery, who felt he was non-operative. IR drainage and culture of collection on 10/12. Currently awaiting culture data.  T11/T12 Osteomyelitis/Discitis: Patient with known osteomyelitis and discitis of T11-T12. MRI at this admission with slight worsening of ventral epidural abscess. Patient seen by ID who recommended we continue his fluconazole. Patient with increased pain after IR abscess drainage. Received dilaudid on 10/13. Stopped overnight and resumed suboxone and flexeril. Technically fenrile in early am of 10/13 (100.4). I suspect this is due to increase in inflammatory markers due to his procedure/increased pain. Afebrile since then. Tmax 98.7 last 24 hours. - Vitals per floor - f/u fungal cultures, anaerobic/aerobic cultures  - suboxone 8/2 bid scheduled, Flexeril PRN - ID consulted - appreciate recommendations - Continue fluzonazole 800mg  daily - Social work following to help with affordability of meds  Hx of Polysubstance Abuse: Patient currently getting suboxone as outpatient. Per Dr. Doristine Section last note he was to get 8mg  bid for 7 days starting 10/4. He had a follow up appointment scheduled for 10/12 with dr.  Evette Doffing. Patient has follow up appointment made for 10/16 at 8:30 at internal medicine clinic for suboxone management. - Continue suboxone and Flexeril PRN for now until pain improves - f/u with internal medicine clinic as above  Hx of Tobacco Use Disorder.Patient has long history of tobacco use. Currently smokes.  - Consider starting nicotine patch if patient requests  Hx of Alcohol Use Disorder:Patient has history of heavy alcohol intake. CIWA scores 0, 0 overnight.  - CIWA protocol  HER:DEYCXKG states he was diagnosed with CHF in prison recently. He states he is not currently taking any medications and was never started on any. He is not having any active signs or symptoms of CHF exacerbation at this time. ECHO 02/19/17 with EF of 50-55%. Weight down 2 pound from admission (218 lb>216lb today).  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 - Continue fluconazole 800mg  for 6 months to 1 year  Hypertension: Patient prescribed lisinopril as outpatient at last admission. Per patient report he did not pick up this medication so it is unlikely he took it, though was provided for free through [facility] program and delivered to patient prior to discharge. Last bp 143/92. - Continue lisinopril 40mg  qd - Hydralazine PRN SBP >170  Hepatitis C: Diagnosed with Hep C at last admission. Hep C vrs RNA positive from 01/3017. Has not been to follow up appointment.  - ID following - appreciation recs - Follow up outpatient as scheduled  Hyponatremia: Sodium of 127 on 10/14.  - salt tabs, 1g, with meals - bmp 10/15  FEN/GI: heart healthy PPx: lovenox   Disposition: Pending medical improvement   Subjective:  Patient resting comfortably today. Asleep when I entered room. Pain has improved.  Objective: Temp:  [98.1  F (36.7 C)-98.7 F (37.1 C)] 98.5 F (36.9 C) (10/14 0600) Pulse Rate:  [60-78] 60 (10/14 0600) Resp:  [18-20] 18 (10/14  0600) BP: (126-157)/(75-96) 143/92 (10/14 0600) SpO2:  [94 %-97 %] 97 % (10/14 0600) Weight:  [216 lb 14.4 oz (98.4 kg)] 216 lb 14.4 oz (98.4 kg) (10/14 0600) Physical Exam: General: resting comfortably, no acute distress, aox3 Cardiovascular: RRR, normal s1 and s2, no murmurs appreciated Respiratory: Lungs clear to ausculation bilaterally, no wheezing, no crackles, normal WOB on RA Abdomen: Soft, non-tender, bowel sounds present Extremities: 5/5 strength  BUE, BLE Back: Minimal TTP of lower back, dressing clean and dry Skin: Warm and dry Neuro: A&Ox3 Psych: Appropriate mood and affect   Laboratory:  Recent Labs Lab 02/26/17 0653 02/28/17 0352 03/02/17 1013  WBC 6.2 6.2 6.2  HGB 13.7 13.8 13.3  HCT 40.0 39.6 39.0  PLT 273 267 258    Recent Labs Lab 02/25/17 1439 02/26/17 0653 02/27/17 0608 02/28/17 0352 03/02/17 1013  NA 131* 134* 134* 133* 127*  K 4.5 4.3 3.8 3.8 3.8  CL 93* 98* 97* 94* 91*  CO2 29 30 29 30 29   BUN 8 7 10 6 13   CREATININE 0.81 0.89 0.80 0.71 0.87  CALCIUM 9.4 8.9 8.7* 9.3 8.7*  PROT 8.9* 7.7  --   --   --   BILITOT 0.8 1.1  --   --   --   ALKPHOS 78 63  --   --   --   ALT 62 51  --   --   --   AST 48* 42*  --   --   --   GLUCOSE 120* 123* 116* 118* 128*    Imaging/Diagnostic Tests: Study Result   CLINICAL DATA:  T11-12 osteomyelitis.  Worsening low back pain.  EXAM: MRI THORACIC WITHOUT AND WITH CONTRAST  TECHNIQUE: Multiplanar and multiecho pulse sequences of the thoracic spine were obtained without and with intravenous contrast.  CONTRAST:  55mL MULTIHANCE GADOBENATE DIMEGLUMINE 529 MG/ML IV SOLN  COMPARISON:  Thoracic spine MRI 02/18/2017  FINDINGS: MRI THORACIC SPINE FINDINGS  Alignment:  Physiologic.  Vertebrae: Persistent loss of normal T1 weighted signal at the T11 and T12 vertebral bodies with endplate irregularities and abnormal contrast enhancement. A hyperintense T2 weighted signal collection that crosses  the disc space and extends beyond the endplates is unchanged and shows persistent contrast-enhancement. An area of contrast enhancement within the spinal canal at the right aspect of the thecal sac has increased from the prior study (series 13, image 43). Contrast enhancement extends into the right neural foramen. Ventral epidural contrast enhancement at T10-T12 otherwise unchanged.  Cord:  Normal caliber and signal of the spinal cord.  Paraspinal and other soft tissues: Prevertebral soft tissue inflammatory change without a well-defined fluid collection.  Disc levels: There are small disc bulges at all levels below T5 but no high-grade spinal canal stenosis.  IMPRESSION: 1. Unchanged appearance of bone marrow signal abnormalities and endplate irregularities at T11-T12 consistent with discitis-osteomyelitis. Partially contrast-enhancing collection involving both endplates and the disc space is unchanged and suggests intradiscal abscess. 2. Slight worsening of ventral epidural contrast enhancement at the T11-T12 levels, particularly along the right aspect of the thecal sac. Finding is concerning for developing epidural abscess. 3. Persistent inflammation of the prevertebral soft tissues at the affected levels.   Electronically Signed   By: Ulyses Jarred M.D    Guadalupe Dawn, MD 03/03/2017, 8:57 AM PGY-1, [place] Intern  pager: [phone], text pages welcome

## 2017-03-03 NOTE — Progress Notes (Signed)
      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   His antifungal S have been sent out  He has appt with me for followup  Would continue high dose fluconazole as originally planned.  I will sign off for now.  Please call with further questions.   Rhina Brackett Dam 03/03/2017, 12:01 PM

## 2017-03-04 ENCOUNTER — Encounter (HOSPITAL_COMMUNITY): Payer: Self-pay | Admitting: Emergency Medicine

## 2017-03-04 ENCOUNTER — Emergency Department (HOSPITAL_COMMUNITY): Payer: Self-pay

## 2017-03-04 ENCOUNTER — Other Ambulatory Visit: Payer: Self-pay

## 2017-03-04 DIAGNOSIS — B3789 Other sites of candidiasis: Secondary | ICD-10-CM | POA: Diagnosis present

## 2017-03-04 DIAGNOSIS — J449 Chronic obstructive pulmonary disease, unspecified: Secondary | ICD-10-CM | POA: Diagnosis present

## 2017-03-04 DIAGNOSIS — I38 Endocarditis, valve unspecified: Secondary | ICD-10-CM | POA: Diagnosis present

## 2017-03-04 DIAGNOSIS — Z79899 Other long term (current) drug therapy: Secondary | ICD-10-CM

## 2017-03-04 DIAGNOSIS — Z880 Allergy status to penicillin: Secondary | ICD-10-CM

## 2017-03-04 DIAGNOSIS — Z590 Homelessness: Secondary | ICD-10-CM

## 2017-03-04 DIAGNOSIS — Z8249 Family history of ischemic heart disease and other diseases of the circulatory system: Secondary | ICD-10-CM

## 2017-03-04 DIAGNOSIS — F1410 Cocaine abuse, uncomplicated: Secondary | ICD-10-CM | POA: Diagnosis present

## 2017-03-04 DIAGNOSIS — E871 Hypo-osmolality and hyponatremia: Secondary | ICD-10-CM | POA: Diagnosis present

## 2017-03-04 DIAGNOSIS — I110 Hypertensive heart disease with heart failure: Secondary | ICD-10-CM | POA: Diagnosis present

## 2017-03-04 DIAGNOSIS — G8929 Other chronic pain: Secondary | ICD-10-CM | POA: Diagnosis present

## 2017-03-04 DIAGNOSIS — M4624 Osteomyelitis of vertebra, thoracic region: Principal | ICD-10-CM | POA: Diagnosis present

## 2017-03-04 DIAGNOSIS — F1010 Alcohol abuse, uncomplicated: Secondary | ICD-10-CM | POA: Diagnosis present

## 2017-03-04 DIAGNOSIS — B1920 Unspecified viral hepatitis C without hepatic coma: Secondary | ICD-10-CM | POA: Diagnosis present

## 2017-03-04 DIAGNOSIS — Z7151 Drug abuse counseling and surveillance of drug abuser: Secondary | ICD-10-CM

## 2017-03-04 DIAGNOSIS — F1110 Opioid abuse, uncomplicated: Secondary | ICD-10-CM | POA: Diagnosis present

## 2017-03-04 DIAGNOSIS — K5900 Constipation, unspecified: Secondary | ICD-10-CM | POA: Diagnosis present

## 2017-03-04 DIAGNOSIS — I5032 Chronic diastolic (congestive) heart failure: Secondary | ICD-10-CM | POA: Diagnosis present

## 2017-03-04 DIAGNOSIS — F17210 Nicotine dependence, cigarettes, uncomplicated: Secondary | ICD-10-CM | POA: Diagnosis present

## 2017-03-04 DIAGNOSIS — M4644 Discitis, unspecified, thoracic region: Secondary | ICD-10-CM | POA: Diagnosis present

## 2017-03-04 LAB — CBC
HEMATOCRIT: 38.8 % — AB (ref 39.0–52.0)
Hemoglobin: 13.6 g/dL (ref 13.0–17.0)
MCH: 35.7 pg — ABNORMAL HIGH (ref 26.0–34.0)
MCHC: 35.1 g/dL (ref 30.0–36.0)
MCV: 101.8 fL — ABNORMAL HIGH (ref 78.0–100.0)
Platelets: 268 10*3/uL (ref 150–400)
RBC: 3.81 MIL/uL — ABNORMAL LOW (ref 4.22–5.81)
RDW: 12.8 % (ref 11.5–15.5)
WBC: 6.2 10*3/uL (ref 4.0–10.5)

## 2017-03-04 LAB — MISC LABCORP TEST (SEND OUT): LABCORP TEST CODE: 183119

## 2017-03-04 NOTE — ED Notes (Addendum)
Pt presents to ED for assessment after being discharged yesterday for a hospitlization for "spine infection".  Pt states his MRI also showed some infection around his heart.  Pt was in his car asking strangers to bring him down to the ED from his car in the parking lot stating he could not walk.  Pt states he has been hot and red all day.  Pt denies chills.  Pt states his back pain is worsening and moving up his spine, states his thighs are having worsening numbness.  Pt sent home with a prescription from suboxone.  Offered ibuprofen, refused.  Pt c/o increasing SOB as well.

## 2017-03-05 ENCOUNTER — Inpatient Hospital Stay (HOSPITAL_COMMUNITY)
Admission: EM | Admit: 2017-03-05 | Discharge: 2017-03-09 | DRG: 540 | Disposition: A | Discharge status: Home / Self Care | Payer: Self-pay | Attending: Family Medicine | Admitting: Family Medicine

## 2017-03-05 ENCOUNTER — Encounter (HOSPITAL_COMMUNITY): Payer: Self-pay | Admitting: Interventional Radiology

## 2017-03-05 ENCOUNTER — Observation Stay (HOSPITAL_COMMUNITY): Payer: Self-pay

## 2017-03-05 ENCOUNTER — Other Ambulatory Visit: Payer: Self-pay

## 2017-03-05 DIAGNOSIS — M4640 Discitis, unspecified, site unspecified: Secondary | ICD-10-CM | POA: Diagnosis present

## 2017-03-05 DIAGNOSIS — F1490 Cocaine use, unspecified, uncomplicated: Secondary | ICD-10-CM | POA: Insufficient documentation

## 2017-03-05 DIAGNOSIS — M4644 Discitis, unspecified, thoracic region: Secondary | ICD-10-CM

## 2017-03-05 DIAGNOSIS — M869 Osteomyelitis, unspecified: Secondary | ICD-10-CM

## 2017-03-05 DIAGNOSIS — F1010 Alcohol abuse, uncomplicated: Secondary | ICD-10-CM | POA: Insufficient documentation

## 2017-03-05 DIAGNOSIS — M546 Pain in thoracic spine: Secondary | ICD-10-CM

## 2017-03-05 LAB — BASIC METABOLIC PANEL
ANION GAP: 8 (ref 5–15)
Anion gap: 12 (ref 5–15)
BUN: 5 mg/dL — ABNORMAL LOW (ref 6–20)
BUN: 7 mg/dL (ref 6–20)
CALCIUM: 9 mg/dL (ref 8.9–10.3)
CHLORIDE: 91 mmol/L — AB (ref 101–111)
CO2: 27 mmol/L (ref 22–32)
CO2: 27 mmol/L (ref 22–32)
CREATININE: 0.79 mg/dL (ref 0.61–1.24)
Calcium: 9.1 mg/dL (ref 8.9–10.3)
Chloride: 95 mmol/L — ABNORMAL LOW (ref 101–111)
Creatinine, Ser: 0.66 mg/dL (ref 0.61–1.24)
GLUCOSE: 123 mg/dL — AB (ref 65–99)
Glucose, Bld: 136 mg/dL — ABNORMAL HIGH (ref 65–99)
POTASSIUM: 3.7 mmol/L (ref 3.5–5.1)
Potassium: 4.3 mmol/L (ref 3.5–5.1)
SODIUM: 130 mmol/L — AB (ref 135–145)
SODIUM: 130 mmol/L — AB (ref 135–145)

## 2017-03-05 LAB — TROPONIN I: Troponin I: <0.03 ng/mL (ref <0.03)

## 2017-03-05 LAB — CBC
HCT: 36.8 % — ABNORMAL LOW (ref 39.0–52.0)
HEMOGLOBIN: 13.2 g/dL (ref 13.0–17.0)
MCH: 36 pg — ABNORMAL HIGH (ref 26.0–34.0)
MCHC: 35.9 g/dL (ref 30.0–36.0)
MCV: 100.3 fL — ABNORMAL HIGH (ref 78.0–100.0)
Platelets: 251 10*3/uL (ref 150–400)
RBC: 3.67 MIL/uL — ABNORMAL LOW (ref 4.22–5.81)
RDW: 13 % (ref 11.5–15.5)
WBC: 6.9 10*3/uL (ref 4.0–10.5)

## 2017-03-05 LAB — LACTIC ACID, PLASMA
LACTIC ACID, VENOUS: 0.9 mmol/L (ref 0.5–1.9)
Lactic Acid, Venous: 1.4 mmol/L (ref 0.5–1.9)

## 2017-03-05 LAB — SEDIMENTATION RATE: SED RATE: 117 mm/h — AB (ref 0–16)

## 2017-03-05 LAB — I-STAT CG4 LACTIC ACID, ED
LACTIC ACID, VENOUS: 0.88 mmol/L (ref 0.5–1.9)
Lactic Acid, Venous: 2.42 mmol/L (ref 0.5–1.9)

## 2017-03-05 LAB — RAPID HIV SCREEN (HIV 1/2 AB+AG)
HIV 1/2 Antibodies: NONREACTIVE
HIV-1 P24 Antigen - HIV24: NONREACTIVE

## 2017-03-05 LAB — C-REACTIVE PROTEIN: CRP: 7.1 mg/dL — ABNORMAL HIGH (ref <1.0)

## 2017-03-05 MED ORDER — BUPRENORPHINE HCL-NALOXONE HCL 8-2 MG SL SUBL: 1.0000 | SUBLINGUAL_TABLET | Freq: Two times a day (BID) | SUBLINGUAL | Status: DC

## 2017-03-05 MED ORDER — ALBUTEROL SULFATE (2.5 MG/3ML) 0.083% IN NEBU: 2.5000 mg | INHALATION_SOLUTION | Freq: Four times a day (QID) | RESPIRATORY_TRACT | Status: DC | PRN

## 2017-03-05 MED ORDER — HEPARIN SODIUM (PORCINE) 5000 UNIT/ML IJ SOLN
5000.0000 [IU] | Freq: Three times a day (TID) | INTRAMUSCULAR | Status: DC
  Administered 2017-03-05 – 2017-03-09 (×11): 5000 [IU] via SUBCUTANEOUS
  Filled 2017-03-05 (×11): qty 1

## 2017-03-05 MED ORDER — HYDROMORPHONE HCL 1 MG/ML IJ SOLN
1.0000 mg | Freq: Once | INTRAMUSCULAR | Status: AC
  Administered 2017-03-05: 1 mg via INTRAVENOUS
  Filled 2017-03-05: qty 1

## 2017-03-05 MED ORDER — ACETAMINOPHEN 650 MG RE SUPP: 650.0000 mg | Freq: Four times a day (QID) | RECTAL | Status: DC | PRN

## 2017-03-05 MED ORDER — FLUCONAZOLE 200 MG PO TABS
800.0000 mg | ORAL_TABLET | Freq: Every day | ORAL | Status: DC
  Administered 2017-03-05: 800 mg via ORAL
  Filled 2017-03-05 (×2): qty 4

## 2017-03-05 MED ORDER — ACETAMINOPHEN 325 MG PO TABS
650.0000 mg | ORAL_TABLET | Freq: Four times a day (QID) | ORAL | Status: DC | PRN
  Administered 2017-03-05 – 2017-03-09 (×8): 650 mg via ORAL
  Filled 2017-03-05 (×8): qty 2

## 2017-03-05 MED ORDER — THIAMINE HCL 100 MG/ML IJ SOLN: 100.0000 mg | Freq: Every day | INTRAMUSCULAR | Status: DC

## 2017-03-05 MED ORDER — LISINOPRIL 40 MG PO TABS
40.0000 mg | ORAL_TABLET | Freq: Every day | ORAL | Status: DC
  Administered 2017-03-05 – 2017-03-09 (×5): 40 mg via ORAL
  Filled 2017-03-05 (×2): qty 1
  Filled 2017-03-05: qty 2
  Filled 2017-03-05 (×2): qty 1

## 2017-03-05 MED ORDER — FOLIC ACID 1 MG PO TABS: 1.0000 mg | ORAL_TABLET | Freq: Every day | ORAL | Status: DC

## 2017-03-05 MED ORDER — DIAZEPAM 5 MG/ML IJ SOLN
5.0000 mg | Freq: Once | INTRAMUSCULAR | Status: AC
  Administered 2017-03-05: 5 mg via INTRAVENOUS
  Filled 2017-03-05: qty 2

## 2017-03-05 MED ORDER — VITAMIN B-1 100 MG PO TABS: 100.0000 mg | ORAL_TABLET | Freq: Every day | ORAL | Status: DC

## 2017-03-05 MED ORDER — THIAMINE HCL 100 MG/ML IJ SOLN
Freq: Once | INTRAVENOUS | Status: AC
  Administered 2017-03-05: 06:00:00 via INTRAVENOUS
  Filled 2017-03-05: qty 1000

## 2017-03-05 MED ORDER — BUPRENORPHINE HCL-NALOXONE HCL 8-2 MG SL SUBL
1.0000 | SUBLINGUAL_TABLET | Freq: Every day | SUBLINGUAL | Status: DC
  Administered 2017-03-05: 1 via SUBLINGUAL
  Filled 2017-03-05: qty 1

## 2017-03-05 MED ORDER — LORAZEPAM 2 MG/ML IJ SOLN: 1.0000 mg | Freq: Four times a day (QID) | INTRAMUSCULAR | Status: DC | PRN

## 2017-03-05 MED ORDER — ADULT MULTIVITAMIN W/MINERALS CH: 1.0000 | ORAL_TABLET | Freq: Every day | ORAL | Status: DC

## 2017-03-05 MED ORDER — CYCLOBENZAPRINE HCL 10 MG PO TABS
10.0000 mg | ORAL_TABLET | Freq: Two times a day (BID) | ORAL | Status: DC
  Administered 2017-03-05 – 2017-03-09 (×9): 10 mg via ORAL
  Filled 2017-03-05 (×9): qty 1

## 2017-03-05 MED ORDER — ADULT MULTIVITAMIN W/MINERALS CH
1.0000 | ORAL_TABLET | Freq: Every day | ORAL | Status: DC
  Administered 2017-03-05 – 2017-03-09 (×5): 1 via ORAL
  Filled 2017-03-05 (×5): qty 1

## 2017-03-05 MED ORDER — LIDOCAINE 5 % EX PTCH
1.0000 | MEDICATED_PATCH | Freq: Once | CUTANEOUS | Status: AC
  Administered 2017-03-05: 1 via TRANSDERMAL
  Filled 2017-03-05: qty 1

## 2017-03-05 MED ORDER — SODIUM CHLORIDE 0.9 % IV BOLUS (SEPSIS)
1000.0000 mL | Freq: Once | INTRAVENOUS | Status: AC
  Administered 2017-03-05: 1000 mL via INTRAVENOUS

## 2017-03-05 MED ORDER — VITAMIN B-1 100 MG PO TABS
100.0000 mg | ORAL_TABLET | Freq: Every day | ORAL | Status: DC
  Administered 2017-03-05 – 2017-03-09 (×5): 100 mg via ORAL
  Filled 2017-03-05 (×5): qty 1

## 2017-03-05 MED ORDER — LORAZEPAM 1 MG PO TABS
1.0000 mg | ORAL_TABLET | Freq: Four times a day (QID) | ORAL | Status: DC | PRN
  Administered 2017-03-05 – 2017-03-06 (×2): 1 mg via ORAL
  Filled 2017-03-05 (×2): qty 1

## 2017-03-05 MED ORDER — SODIUM CHLORIDE 0.9 % IV SOLN
200.0000 mg | INTRAVENOUS | Status: DC
  Administered 2017-03-05 – 2017-03-08 (×4): 200 mg via INTRAVENOUS
  Filled 2017-03-05 (×5): qty 200

## 2017-03-05 MED ORDER — POLYETHYLENE GLYCOL 3350 17 G PO PACK
17.0000 g | PACK | Freq: Every day | ORAL | Status: DC | PRN
  Administered 2017-03-06 – 2017-03-07 (×2): 17 g via ORAL
  Filled 2017-03-05 (×2): qty 1

## 2017-03-05 NOTE — ED Notes (Signed)
Dr Baxter Flattery in w/pt.

## 2017-03-05 NOTE — ED Notes (Signed)
Pt arrived to [facility] via stretcher - Pt alert, oriented. IV noted to be infusing to right hand. Pt wearing hospital gown and has his cell phone and knife w/him. Knife given to pt's mother and pt instructed may not have any weapons in hospital. Pt's 1 labeled belongings bag placed at nurses' desk - clothing and home meds - encouraging mother to take pt's meds when she leaves. Pt and mother aware being medically admitted. Pt cursing and talking loudly to RN - states he wants to see his admitting dr because he needs pain medicine and Suboxone and Flexeril given is not strong enough. Mother sitting at bedside.

## 2017-03-05 NOTE — ED Notes (Signed)
Pt refused rectal temp.

## 2017-03-05 NOTE — ED Notes (Signed)
Left message for pt's mother to call so may advise of pt's room #. Pt aware. Pt being escorted via w/c to 5N29. IV infusing w/o difficulty. 1 labeled belongings bag given to pt - clothes and meds. Pt's mother took his pocket knife.

## 2017-03-05 NOTE — ED Notes (Signed)
0.9%NS 228ml bag hung w/Eraxis.

## 2017-03-05 NOTE — ED Notes (Signed)
Pt given ginger ale per Jessica(RN)
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
## 2017-03-05 NOTE — ED Notes (Addendum)
Dr in w/pt.  

## 2017-03-05 NOTE — ED Notes (Signed)
Elevated Lactic Acid = 2.42 called to Dr. Winfred Leeds. He acknowledged result.

## 2017-03-05 NOTE — ED Notes (Signed)
Pt returned to room from bathroom. Mother remains at bedside.

## 2017-03-05 NOTE — Consult Note (Signed)
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 Patient also has evidence of mitral vegetation on prior TEE. Patient history significant for IVDU. Patient recently admitted for the same and was seen by neurosurgery who determined surgery was not indicated at that time.  Plan: 1. Discontinue Fluconazole 2. Start Eraxis 200mg  q24h 2. Repeat Culture pending. If positive obtain repeat TEE  3. Consider repeat MRI if new neurological findings develop   Active Problems:   Diskitis   Scheduled Meds: . buprenorphine-naloxone  1 tablet Sublingual Daily  . cyclobenzaprine  10 mg Oral BID  . heparin  5,000 Units Subcutaneous Q8H  . lidocaine  1 patch Transdermal Once  . lisinopril  40 mg Oral Daily  . multivitamin with minerals  1 tablet Oral Daily  . thiamine  100 mg Oral Daily   Or  . thiamine  100 mg Intravenous Daily   Continuous Infusions: . anidulafungin     PRN Meds:.acetaminophen **OR** acetaminophen, albuterol, LORazepam **OR** LORazepam, polyethylene glycol  HPI: [name] is a 51 y.o. male with a history of Chronic back pain, IVDU, COPD and CHF who presented with worsening back pain and new onset leg pain in the setting of known discitis / osteomyelitis at T11 / T12. Patient recently discharged on 10/14 following hospitalization for the same. He was discharged of Fluconazole 800mg  PO for a 6 month course and he states he has been taking this medication as instructed. He states that he is agreeable to staying in the  hospital or other facility if needed to receive IV antifungals.  Review of Systems: Review of Systems  Constitutional: Positive for chills, diaphoresis and fever.  Gastrointestinal: Positive for constipation.  Musculoskeletal: Positive for back pain.  All other systems reviewed and are negative.   Past Medical History:  Diagnosis Date  . CHF (congestive heart failure) ([place])   . Chronic bronchitis ([place])   . Chronic lower back pain   . COPD (chronic obstructive pulmonary disease) ([place])   . Diskitis   . Panic attacks   . Tobacco abuse     Social History  Substance Use Topics  . Smoking status: Current Every Day Smoker    Packs/day: 0.50    Years: 35.00    Types: Cigarettes  . Smokeless tobacco: Former Systems developer    Types: Chew  . Alcohol use Yes     Comment: Daily. Heavy. ; Daily. 1-2 grams a day. ; 02/26/2017 "none in the last week"    Family History  Problem Relation Age of Onset  . Hypertension Mother   . Cancer Other   . Stroke Other   . Coronary artery disease Other    Allergies  Allergen Reactions  . Penicillins     From childhood: Has patient had a PCN reaction causing immediate rash, facial/tongue/throat swelling, SOB or lightheadedness with hypotension: Unknown Has patient had a PCN reaction causing  severe rash involving mucus membranes or skin necrosis: Unknown Has patient had a PCN reaction that required hospitalization: Unknown Has patient had a PCN reaction occurring within the last 10 years: No If all of the above answers are "NO", then may proceed with Cephalosporin use.     OBJECTIVE: Blood pressure (!) 133/93, pulse 60, temperature 98.4 F (36.9 C), temperature source Rectal, resp. rate 16, SpO2 94 %.  Physical Exam  Constitutional: He is oriented to person, place, and time.  WDWN Male in moderate distress  HENT:  Head: Normocephalic and atraumatic.  Eyes: EOM are normal.  No subconjunctival hemmorhages   Cardiovascular: Normal rate, regular  rhythm and normal heart sounds.   Pulmonary/Chest: Effort normal and breath sounds normal. No respiratory distress.  Abdominal: Soft.  Bowl Sounds present  Musculoskeletal: He exhibits no edema or deformity.  Neurological: He is alert and oriented to person, place, and time.  Skin: Skin is warm and dry.  No splinter hemmorhages    Lab Results Lab Results  Component Value Date   WBC 6.9 03/05/2017   HGB 13.2 03/05/2017   HCT 36.8 (L) 03/05/2017   MCV 100.3 (H) 03/05/2017   PLT 251 03/05/2017    Lab Results  Component Value Date   CREATININE 0.66 03/05/2017   BUN 7 03/05/2017   NA 130 (L) 03/05/2017   K 4.3 03/05/2017   CL 95 (L) 03/05/2017   CO2 27 03/05/2017    Lab Results  Component Value Date   ALT 51 02/26/2017   AST 42 (H) 02/26/2017   ALKPHOS 63 02/26/2017   BILITOT 1.1 02/26/2017     Microbiology: Recent Results (from the past 240 hour(s))  Culture, blood (routine x 2)     Status: None   Collection Time: 02/26/17  4:35 AM  Result Value Ref Range Status   Specimen Description BLOOD RIGHT HAND  Final   Special Requests   Final    BOTTLES DRAWN AEROBIC AND ANAEROBIC Blood Culture adequate volume   Culture NO GROWTH 5 DAYS  Final   Report Status 03/03/2017 FINAL  Final  Culture, blood (routine x 2)     Status: None   Collection Time: 02/26/17  4:47 AM  Result Value Ref Range Status   Specimen Description BLOOD LEFT HAND  Final   Special Requests   Final    BOTTLES DRAWN AEROBIC AND ANAEROBIC Blood Culture adequate volume   Culture NO GROWTH 5 DAYS  Final   Report Status 03/03/2017 FINAL  Final  Fungus Culture With Stain     Status: None   Collection Time: 03/01/17 10:18 AM  Result Value Ref Range Status   Fungus Stain Final report  Final   Fungus (Mycology) Culture Preliminary report  Final    Comment: (NOTE) Performed At: [facility] [place], [place] [id number] Lindon Romp MD YK:[id number]    Fungal Source TISSUE  Final      Comment: DISC T11 T12  Aerobic/Anaerobic Culture (surgical/deep wound)     Status: None (Preliminary result)   Collection Time: 03/01/17 10:18 AM  Result Value Ref Range Status   Specimen Description TISSUE DISC T11 T12  Final   Special Requests NONE  Final   Gram Stain   Final    ABUNDANT WBC PRESENT, PREDOMINANTLY PMN NO ORGANISMS SEEN    Culture   Final    FEW CANDIDA ALBICANS CRITICAL RESULT CALLED TO, READ BACK BY AND VERIFIED WITH: C VAN DAM,MD AT 1200 03/03/17  BY L BENFIELD CONCERNING GROWTH ON CULTURE NO ANAEROBES ISOLATED; CULTURE IN PROGRESS FOR 5 DAYS    Report Status PENDING  Incomplete  Fungus Culture Result     Status: None   Collection Time: 03/01/17 10:18 AM  Result Value Ref Range Status   Result 1 Comment  Final    Comment: (NOTE) KOH/Calcofluor preparation:  no fungus observed. Performed At: [facility] [place], [place] [id number] Lindon Romp MD [id number]   Fungal organism reflex     Status: None   Collection Time: 03/01/17 10:18 AM  Result Value Ref Range Status   Fungal result 1 Comment  Final    Comment: (NOTE) Yeast isolated, identification in progress. Performed At: [facility] [address] [place], [place] [id number] Lindon Romp MD VW:[id number]     Pearson Grippe, DO IM PGY-1

## 2017-03-05 NOTE — ED Notes (Signed)
Attempted to call report to 5N -  Secretary advised will call RN back.

## 2017-03-05 NOTE — ED Notes (Signed)
Pt denies improvement of pain with lidocaine patch

## 2017-03-05 NOTE — ED Notes (Signed)
Family practice at bedside.

## 2017-03-05 NOTE — ED Notes (Signed)
Patient asked tech out front for pain medication.  This RN asked tech to offer him ibuprofen again, and patient states he does not want to get up to come get medication.  States he wants to take his own ibuprofen.

## 2017-03-05 NOTE — ED Notes (Signed)
Pt eating graham crackers w/peanut butter given as requested. Pt states Lidocaine Patch fell off.

## 2017-03-05 NOTE — ED Notes (Addendum)
Lactic results called to Wells Guiles RN and Dr.Knapp
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
## 2017-03-05 NOTE — ED Notes (Signed)
Pt denies improvement of pain with dilaudid; flexeril and suboxone are due later. Spoke to Dr.Bland and obtained verbal order for lidoderm patch
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
## 2017-03-06 LAB — BASIC METABOLIC PANEL
Anion gap: 8 (ref 5–15)
BUN: 8 mg/dL (ref 6–20)
CALCIUM: 8.9 mg/dL (ref 8.9–10.3)
CO2: 28 mmol/L (ref 22–32)
CREATININE: 0.68 mg/dL (ref 0.61–1.24)
Chloride: 96 mmol/L — ABNORMAL LOW (ref 101–111)
Glucose, Bld: 125 mg/dL — ABNORMAL HIGH (ref 65–99)
Potassium: 4.3 mmol/L (ref 3.5–5.1)
SODIUM: 132 mmol/L — AB (ref 135–145)

## 2017-03-06 LAB — HEPATITIS PANEL, ACUTE
HEP A IGM: NEGATIVE
HEP B C IGM: NEGATIVE
HEP B S AG: NEGATIVE

## 2017-03-06 LAB — CBC
HCT: 36.7 % — ABNORMAL LOW (ref 39.0–52.0)
Hemoglobin: 12.5 g/dL — ABNORMAL LOW (ref 13.0–17.0)
MCH: 35 pg — ABNORMAL HIGH (ref 26.0–34.0)
MCHC: 34.1 g/dL (ref 30.0–36.0)
MCV: 102.8 fL — ABNORMAL HIGH (ref 78.0–100.0)
PLATELETS: 231 10*3/uL (ref 150–400)
RBC: 3.57 MIL/uL — ABNORMAL LOW (ref 4.22–5.81)
RDW: 13 % (ref 11.5–15.5)
WBC: 5.7 10*3/uL (ref 4.0–10.5)

## 2017-03-06 LAB — AEROBIC/ANAEROBIC CULTURE (SURGICAL/DEEP WOUND)

## 2017-03-06 LAB — AEROBIC/ANAEROBIC CULTURE W GRAM STAIN (SURGICAL/DEEP WOUND)

## 2017-03-06 MED ORDER — SENNOSIDES-DOCUSATE SODIUM 8.6-50 MG PO TABS
1.0000 | ORAL_TABLET | Freq: Every day | ORAL | Status: DC | PRN
  Administered 2017-03-06 – 2017-03-07 (×2): 1 via ORAL
  Filled 2017-03-06 (×2): qty 1

## 2017-03-06 MED ORDER — SODIUM CHLORIDE 0.9% FLUSH
10.0000 mL | INTRAVENOUS | Status: DC | PRN
  Administered 2017-03-08: 10 mL
  Filled 2017-03-06: qty 40

## 2017-03-06 MED ORDER — FOLIC ACID 1 MG PO TABS
1.0000 mg | ORAL_TABLET | Freq: Every day | ORAL | Status: DC
  Administered 2017-03-06 – 2017-03-08 (×3): 1 mg via ORAL
  Filled 2017-03-06 (×4): qty 1

## 2017-03-06 MED ORDER — MAGNESIUM HYDROXIDE 400 MG/5ML PO SUSP
30.0000 mL | Freq: Every day | ORAL | Status: DC | PRN
  Administered 2017-03-06 – 2017-03-07 (×2): 30 mL via ORAL
  Filled 2017-03-06 (×2): qty 30

## 2017-03-06 MED ORDER — SODIUM CHLORIDE 0.9% FLUSH
10.0000 mL | Freq: Two times a day (BID) | INTRAVENOUS | Status: DC
  Administered 2017-03-06 (×2): 10 mL
  Administered 2017-03-07: 30 mL
  Administered 2017-03-08 (×2): 10 mL

## 2017-03-06 MED ORDER — KETOROLAC TROMETHAMINE 30 MG/ML IJ SOLN
30.0000 mg | Freq: Once | INTRAMUSCULAR | Status: AC
  Administered 2017-03-06: 30 mg via INTRAVENOUS
  Filled 2017-03-06: qty 1

## 2017-03-06 MED ORDER — HYDROCHLOROTHIAZIDE 12.5 MG PO CAPS
12.5000 mg | ORAL_CAPSULE | Freq: Every day | ORAL | Status: DC
  Administered 2017-03-06 – 2017-03-09 (×4): 12.5 mg via ORAL
  Filled 2017-03-06 (×4): qty 1

## 2017-03-06 MED ORDER — HYDRALAZINE HCL 20 MG/ML IJ SOLN
10.0000 mg | INTRAMUSCULAR | Status: DC | PRN
  Administered 2017-03-06 – 2017-03-08 (×3): 10 mg via INTRAVENOUS
  Filled 2017-03-06 (×3): qty 1

## 2017-03-06 NOTE — Progress Notes (Signed)
Family Medicine Teaching Service Daily Progress Note Intern Pager: [phone]  Patient name: [name] Medical record number: [id number] Date of birth: [birth year] Age: 51 y.o. Gender: male  Primary Care Provider: Patient, No Pcp Per Consultants: ID Code Status: full  Pt Overview and Major Events to Date:  Bentlie Bennett is a 51 y.o. male presenting with back pain. 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   Assessment and Plan: Jeran Hiltz is a 51 y.o. male presenting with back pain. 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   Back pain: Thoracic xr shows no changes from prior admission.  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 Location of pain is at T11/12 level, but lateral to paraspinal muscles. No dysuria to suggest UTI with pyelo or renal stone. Additional possible etiology includes MSK related back pain given patient's large abdominal girth.  -serial neurological exam for any new symptoms -see osteomyelitis below    T11/T12 Osteomyelitis/Discitis/abscess: 2 admissions this month for known osteomyelitis and discitis of T11-T12.  Patient seen by ID on last admission who recommended we continue his fluconazole. s/p IR abscess drainage. Afebrile in ED. MRI stable as above. -observe on med-surg, Attending Dr. Andria Frames - Vitals per floor - suboxone cancelled per discussion with Dr. Daryll Drown about his suitability for this treatment - d/d fluzonazole 800mg  daily per ID -eraxis added per ID -blood culture pending -ID said to hold on neurosurgery pending their  assesment  Potential fungal endocarditis- prior admission TEE showed potential endocarditis in setting of known candidal spinal infection -patient getting [facility] cardiac imaging this AM -blood culture pending  Chest pain- none current. serial troponins were neg x3. -monitor QT with fluconazole -repeat ecg ordered 10/17  Hx of Polysubstance Abuse: Patient has had interruptions to establishing suboxone as outpatient due to mulitple admissions. Per Dr. Doristine Section last note he was to get 8mg  bid for 7 days starting 10/4. He had a follow up appointment scheduled for 10/12 with Dr. Evette Doffing. Patient has follow up appointment made for 10/16 at 8:30 at internal medicine clinic for suboxone management. - Continue suboxone and Flexeril PRN - f/u with internal medicine clinic as above, notify them patient is here  Hx of Tobacco Use Disorder.Patient has long history of tobacco use. Currently smokes.  - Consider starting nicotine patch if patient requests  Hx of Alcohol Use Disorder:CIWA 10.  Patient has history of heavy alcohol intake. Claims only 2-3 beers since discharge 10/14 - CIWA protocol  GUR:KYHCWCB states he was diagnosed with CHF in prison recently. He states he is not currently taking any medications and was never started on any. He is not having any active signs or symptoms of CHF exacerbation at this time. ECHO 02/19/17 with EF of 50-55%.  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 - Continue fluconazole 800mg  for 6 months to 1 year per ID  Hypertension:  160/99.  was provided for free through [facility] program and delivered to patient prior to discharge. Last bp125/97. - Continue lisinopril 40mg  qd - Hydralazine PRN SBP >170  Constipation: states no bowel movements since last admission. -miralax  Hepatitis  C: Diagnosed with Hep C at last  admission. Hep C vrs RNA positive from 01/3017.Has not been to follow up appointment.  - Follow up outpatient as scheduled  Assymptomatic Hyponatremia:  chronic and likely given alcohol use.   -no intervention as patient is chronic and assymptomatic  FEN/GI: heart healthy PPx: hep subq  Disposition: likely to community pending IDs clearance  Subjective:  Patient was sleeping on arrival to interview.  Patient very upset over suboxone discontinuation.   Wanted to know what opioid we were going to offer instead, said flexeril and toradol "don't touch my pain"   Objective: Temp:  [97.3 F (36.3 C)-98.1 F (36.7 C)] 98.1 F (36.7 C) (10/17 0437) Pulse Rate:  [54-109] 55 (10/17 0437) Resp:  [14-20] 20 (10/17 0437) BP: (109-158)/(79-106) 152/99 (10/17 0437) SpO2:  [94 %-98 %] 95 % (10/17 0437) Physical Exam: General: uncomfortable, no acute distress Cardiovascular: RRR, normal s1 and s2, no murmurs appreciated Respiratory: Lungs course but no crackles/wheezing, normal WOB on RA Abdomen: Soft, non-tender, bowel sounds present Extremities: 5/5 strength BUE, BLE (pain in LE on movement) Back: no tenderness over spinal processes, tenderness about 4 inches lateral to spine ~t-11, no lesions noted Skin: Warm and dry Neuro: A&Ox3 Psych: Appropriate mood and affect  Laboratory:  Recent Labs Lab 03/04/17 2340 03/05/17 0339 03/06/17 0449  WBC 6.2 6.9 5.7  HGB 13.6 13.2 12.5*  HCT 38.8* 36.8* 36.7*  PLT 268 251 231    Recent Labs Lab 03/04/17 2340 03/05/17 0339 03/06/17 0449  NA 130* 130* 132*  K 3.7 4.3 4.3  CL 91* 95* 96*  CO2 27 27 28   BUN 5* 7 8  CREATININE 0.79 0.66 0.68  CALCIUM 9.1 9.0 8.9  GLUCOSE 136* 123* 125*    Troponin neg x3 Hep A/B neg, HIV neg  Imaging/Diagnostic Tests: Dg Chest 2 View  Result Date: 03/05/2017 CLINICAL DATA:  Shortness of breath with chest pain EXAM: CHEST  2 VIEW COMPARISON:  02/16/2017 FINDINGS: Shallow depth of inspiration.  Note consolidation or effusion. Prominent cardiomediastinal silhouette likely augmented by low lung volume. Stable compression deformity at the thoracolumbar junction. IMPRESSION: No active cardiopulmonary disease.  Stable mild bronchitic changes. Electronically Signed   By: Donavan Foil M.D.   On: 03/05/2017 00:03   Dg Thoracic Spine 2 View  Result Date: 03/05/2017 CLINICAL DATA:  Osteomyelitis T11-T12 with pain and new radiculopathy. No known injury. EXAM: THORACIC SPINE 2 VIEWS COMPARISON:  MRI 03/04/2017. FINDINGS: Diffuse osteopenia and degenerative change. Stable deformity of a lower thoracic vertebral bodies with what appear to be used destructive changes of what is most likely T12. Similar findings noted on prior MRI 06/29/2016. Findings again consistent with discitis/osteomyelitis at this level . IMPRESSION: 1. Persistent changes of discitis/ osteomyelitis T11-T12. Similar findings noted on prior MRI 03/04/2017 . 2. Diffuse osteopenia and degenerative change . Electronically Signed   By: Marcello Moores  Register   On: 03/05/2017 12:01     Sherene Sires, DO 03/06/2017, 6:34 AM PGY-1, [place] Intern pager: [phone], text pages welcome

## 2017-03-06 NOTE — Progress Notes (Addendum)
[facility]                   Date of Admission:  03/05/2017                                 Total days of antifungals  17                                                                                     Eraxis 03/05/17 - present                                                                                     Fluconazole 02/21/17 - 03/05/17                                                                                     Eraxis 02/17/17 -02/20/17                                                                                                                                                    Reason for Consult: 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 Patient thought to have evidence of mitral vegetation on prior TEE which would be consistent with candidal endocarditis. In the past, has left AMA and not received IV therapy on discharge due to history significant for IVDU. Patient recently admitted for worsening back pain/radiculopathy and not a surgical candidate. Plain films do not suggests worsening disease process.  Appears improved, better pain control in the last 24hrs ,though has constipation - 24 hr Tmax 98.1. WBC 5.7 - Blood Cultures x2 (10/16): Pending  Plan: 1. He is currently on Eraxis 200mg  q24h to see if he has recurrent fungemia which would be more concerning for endocarditis 2. Repeat Culture pending. If positive recommend repeat TEE. If no growth in 48-72 hrs, transition patient back to Fluconazole 800mg  Daily to complete a course of at least 6 months. Patient to follow up at [facility]. 3. Constipation = defer to primary team, maybe related to opiates  Active Problems:   Diskitis   Scheduled Meds: .  cyclobenzaprine  10 mg Oral BID  . heparin  5,000 Units Subcutaneous Q8H  . lisinopril  40 mg Oral Daily  . multivitamin with minerals  1 tablet Oral Daily  . sodium chloride flush  10-40 mL Intracatheter Q12H  . thiamine  100 mg Oral Daily   Or  . thiamine  100 mg Intravenous Daily   Continuous Infusions: . anidulafungin 200 mg (03/05/17 1654)   PRN Meds:.acetaminophen **OR** acetaminophen, albuterol, LORazepam **OR** LORazepam, polyethylene glycol, senna-docusate, sodium chloride flush   SUBJECTIVE: Patient states he is feeling somewhat better today. His pain remains significant but is slightly improved from yesterday. He states he is no longer experiencing leg pain. He denies fevers or chills. Endorses constipation.   Review of Systems: Review of Systems  Gastrointestinal: Positive for constipation.  Musculoskeletal: Positive for back pain.  All other systems reviewed and are negative.   Allergies  Allergen Reactions  . Penicillins     From childhood: Has patient had a PCN reaction causing immediate rash, facial/tongue/throat swelling, SOB or lightheadedness with hypotension: Unknown Has patient had a PCN reaction causing severe rash involving mucus membranes or skin necrosis: Unknown Has patient had a PCN reaction that required hospitalization: Unknown Has patient had a PCN reaction occurring within the last 10 years: No If all of the above answers are "NO", then may proceed with Cephalosporin use.     OBJECTIVE: Vitals:   03/05/17 1300 03/05/17 1744 03/05/17 2027 03/06/17 0437  BP: (!) 133/93 (!) 134/106 (!) 155/90 (!) 152/99  Pulse: 60 71 60 (!) 55  Resp: 16 16 14 20   Temp:  98.1 F (36.7 C) (!) 97.3 F (36.3 C) 98.1 F (36.7 C)  TempSrc:  Oral Axillary Oral  SpO2: 94% 96% 97% 95%   There is no height or weight on file to calculate BMI.  Physical Exam  Constitutional: He is oriented to person, place, and time and well-developed, well-nourished, and in no  distress.  HENT:  Head: Normocephalic and atraumatic.  Eyes: EOM are normal.  No subconjunctival hemorrhages  Cardiovascular: Normal rate, regular rhythm and normal heart sounds.   Pulmonary/Chest: Effort normal and breath sounds normal. No respiratory distress.  Abdominal: Bowel sounds are normal. He exhibits distension. There is no tenderness.  Musculoskeletal: He exhibits no edema or deformity.  Neurological: He is alert and oriented to person, place, and time.  Skin: Skin is warm and dry.  No splinter hemmorhages    Lab Results Lab Results  Component Value Date   WBC 5.7 03/06/2017   HGB 12.5 (L) 03/06/2017   HCT 36.7 (L) 03/06/2017   MCV 102.8 (H) 03/06/2017   PLT 231 03/06/2017    Lab Results  Component Value Date   CREATININE 0.68 03/06/2017   BUN 8 03/06/2017   NA 132 (L) 03/06/2017   K 4.3 03/06/2017   CL  96 (L) 03/06/2017   CO2 28 03/06/2017    Lab Results  Component Value Date   ALT 51 02/26/2017   AST 42 (H) 02/26/2017   ALKPHOS 63 02/26/2017   BILITOT 1.1 02/26/2017     Microbiology: Recent Results (from the past 240 hour(s))  Culture, blood (routine x 2)     Status: None   Collection Time: 02/26/17  4:35 AM  Result Value Ref Range Status   Specimen Description BLOOD RIGHT HAND  Final   Special Requests   Final    BOTTLES DRAWN AEROBIC AND ANAEROBIC Blood Culture adequate volume   Culture NO GROWTH 5 DAYS  Final   Report Status 03/03/2017 FINAL  Final  Culture, blood (routine x 2)     Status: None   Collection Time: 02/26/17  4:47 AM  Result Value Ref Range Status   Specimen Description BLOOD LEFT HAND  Final   Special Requests   Final    BOTTLES DRAWN AEROBIC AND ANAEROBIC Blood Culture adequate volume   Culture NO GROWTH 5 DAYS  Final   Report Status 03/03/2017 FINAL  Final  Fungus Culture With Stain     Status: None   Collection Time: 03/01/17 10:18 AM  Result Value Ref Range Status   Fungus Stain Final report  Final   Fungus (Mycology)  Culture Preliminary report  Final    Comment: (NOTE) Performed At: [facility] [place], [place] [id number] Lindon Romp MD WG:[id number]    Fungal Source TISSUE  Final    Comment: DISC T11 T12  Aerobic/Anaerobic Culture (surgical/deep wound)     Status: None (Preliminary result)   Collection Time: 03/01/17 10:18 AM  Result Value Ref Range Status   Specimen Description TISSUE DISC T11 T12  Final   Special Requests NONE  Final   Gram Stain   Final    ABUNDANT WBC PRESENT, PREDOMINANTLY PMN NO ORGANISMS SEEN    Culture   Final    FEW CANDIDA ALBICANS CRITICAL RESULT CALLED TO, READ BACK BY AND VERIFIED WITH: C VAN DAM,MD AT 1200 03/03/17 BY L BENFIELD CONCERNING GROWTH ON CULTURE NO ANAEROBES ISOLATED; CULTURE IN PROGRESS FOR 5 DAYS    Report Status PENDING  Incomplete  Fungus Culture Result     Status: None   Collection Time: 03/01/17 10:18 AM  Result Value Ref Range Status   Result 1 Comment  Final    Comment: (NOTE) KOH/Calcofluor preparation:  no fungus observed. Performed At: [facility] [place], [place] [id number] Lindon Romp MD PQ:[id number]   Fungal organism reflex     Status: None   Collection Time: 03/01/17 10:18 AM  Result Value Ref Range Status   Fungal result 1 Comment  Final    Comment: (NOTE) Yeast isolated, identification in progress. Performed At: [facility] [address] [place], [place] [id number] Lindon Romp MD [id number]    Pearson Grippe, DO IM PGY-1 ------------------------------  I have seen the patient with dr Trilby Drummer, agree with assessment and plan as outlined above  [facility] [phone]

## 2017-03-06 NOTE — Discharge Summary (Signed)
[facility] Discharge Summary  Patient name: [name] Medical record number: [id number] Date of birth: 05-Feb-1966 Age: 51 y.o. Gender: male Date of Admission: 03/05/2017  Date of Discharge: 03/08/17  Admitting Physician: Zenia Resides, MD  Primary Care Provider: Patient, No Pcp Per Consultants: ID  Indication for Hospitalization:vertebral osteomyelitis w/ subjective radiculopathy  Discharge Diagnoses/Problem List:  Vertebral osteomyelitis/phlegmon, HTN, suspicion of endocarditis, polysubstance use  Disposition: home  Discharge Condition: stable  Discharge Exam: General: uncomfortable, no acute distress Cardiovascular: RRR, normal s1 and s2, no murmurs appreciated Respiratory: Lungs course but no crackles/wheezing, normal WOB on RA Abdomen: Soft, non-tender, bowel sounds present, obese Extremities: 5/5 strength BUE, BLE (decreased pain from ED admission) Back: no tenderness over spinal processes, tenderness over musculature about 4 inches lateral tospine ~t-11, no lesions noted Skin: Warm and dry Neuro: A&Ox3 Psych: Appropriate mood and affect  Brief Hospital Course:  Patient admitted for concern of new complaints of radiculopathy for 3rd time this month as he is known to have candidal osteomyelitis of T11-12 with spinal phlegmon.  He was placed on IV antifungal while thoracic XR and then repeat MRI was ordered to confirm status of infection which was shown to have only minimal progression.  As MRI showed no acute surgical need, neurological status was normal with the exception of mild but improving radiculopathy consistent with patient's underlying degenerative spine patient was discharged with prior prescription of oral fluconazole 800mg  daily.  Due to repeat admissions for subjective pain and illicit drug use, patient is not a candidate for continued suboxone treatment  Issues for Follow Up:  1. Given patient's heavy pain component and  substance use they are not a candidate for suboxone at this time.   Should pain stabilize and patient cease other substances the clinic would be happy to have another discussion with him 2. Patient with spinal candidal osteomyelitis/discitis/abscess with potential endocarditis, it is important that they take 800 fluconazole until May 2019 or nov 2019 depending on ID guidance.  They should follow with ID within a few weeks of discharge  Significant Procedures:   Significant Labs and Imaging:   Recent Labs Lab 03/06/17 0449 03/07/17 0733 03/08/17 0345  WBC 5.7 5.9 6.8  HGB 12.5* 12.5* 13.0  HCT 36.7* 37.3* 37.2*  PLT 231 258 257    Recent Labs Lab 03/04/17 2340 03/05/17 0339 03/06/17 0449 03/07/17 0733 03/08/17 0345 03/08/17 1005  NA 130* 130* 132* 130* 131*  --   K 3.7 4.3 4.3 3.7 4.0  --   CL 91* 95* 96* 92* 91*  --   CO2 27 27 28 31 31   --   GLUCOSE 136* 123* 125* 107* 126*  --   BUN 5* 7 8 5* 6  --   CREATININE 0.79 0.66 0.68 0.76 0.77  --   CALCIUM 9.1 9.0 8.9 9.1 9.5  --   ALKPHOS  --   --   --   --   --  79  AST  --   --   --   --   --  47*  ALT  --   --   --   --   --  55  ALBUMIN  --   --   --   --   --  3.1*    Dg Chest 2 View  Result Date: 03/05/2017 CLINICAL DATA:  Shortness of breath with chest pain EXAM: CHEST  2 VIEW COMPARISON:  02/16/2017 FINDINGS: Shallow depth of  inspiration. Note consolidation or effusion. Prominent cardiomediastinal silhouette likely augmented by low lung volume. Stable compression deformity at the thoracolumbar junction. IMPRESSION: No active cardiopulmonary disease.  Stable mild bronchitic changes. Electronically Signed   By: Donavan Foil M.D.   On: 03/05/2017 00:03   Dg Thoracic Spine 2 View  Result Date: 03/05/2017 CLINICAL DATA:  Osteomyelitis T11-T12 with pain and new radiculopathy. No known injury. EXAM: THORACIC SPINE 2 VIEWS COMPARISON:  MRI 03/04/2017. FINDINGS: Diffuse osteopenia and degenerative change. Stable deformity  of a lower thoracic vertebral bodies with what appear to be used destructive changes of what is most likely T12. Similar findings noted on prior MRI 06/29/2016. Findings again consistent with discitis/osteomyelitis at this level . IMPRESSION: 1. Persistent changes of discitis/ osteomyelitis T11-T12. Similar findings noted on prior MRI 03/04/2017 . 2. Diffuse osteopenia and degenerative change . Electronically Signed   By: Marcello Moores  Register   On: 03/05/2017 12:01    Results/Tests Pending at Time of Discharge:   Discharge Medications:  Allergies as of 03/09/2017      Reactions   Penicillins    From childhood: Has patient had a PCN reaction causing immediate rash, facial/tongue/throat swelling, SOB or lightheadedness with hypotension: Unknown Has patient had a PCN reaction causing severe rash involving mucus membranes or skin necrosis: Unknown Has patient had a PCN reaction that required hospitalization: Unknown Has patient had a PCN reaction occurring within the last 10 years: No If all of the above answers are "NO", then may proceed with Cephalosporin use.      Medication List    STOP taking these medications   acetaminophen 325 MG tablet Commonly known as:  TYLENOL   buprenorphine-naloxone 8-2 mg Subl SL tablet Commonly known as:  SUBOXONE     TAKE these medications   albuterol 108 (90 Base) MCG/ACT inhaler Commonly known as:  PROVENTIL HFA;VENTOLIN HFA Inhale 2 puffs into the lungs every 6 (six) hours as needed for wheezing or shortness of breath.   cyclobenzaprine 10 MG tablet Commonly known as:  FLEXERIL Take 1 tablet (10 mg total) by mouth 2 (two) times daily.   fluconazole 200 MG tablet Commonly known as:  DIFLUCAN Take 4 tablets (800 mg total) by mouth daily.   folic acid 1 MG tablet Commonly known as:  FOLVITE Take 1 tablet (1 mg total) by mouth daily.   hydrochlorothiazide 12.5 MG capsule Commonly known as:  MICROZIDE Take 1 capsule (12.5 mg total) by mouth  daily.   lisinopril 40 MG tablet Commonly known as:  PRINIVIL,ZESTRIL Take 1 tablet (40 mg total) by mouth daily.   multivitamin with minerals Tabs tablet Take 1 tablet by mouth daily.   polyethylene glycol packet Commonly known as:  MIRALAX / GLYCOLAX Take 17 g by mouth daily.   thiamine 100 MG tablet Take 1 tablet (100 mg total) by mouth daily.       Discharge Instructions: Please refer to Patient Instructions section of EMR for full details.  Patient was counseled important signs and symptoms that should prompt return to medical care, changes in medications, dietary instructions, activity restrictions, and follow up appointments.   Follow-Up Appointments:   Sherene Sires, DO 03/09/2017, 3:09 PM PGY-1, [place]

## 2017-03-07 ENCOUNTER — Inpatient Hospital Stay (HOSPITAL_COMMUNITY): Payer: Self-pay

## 2017-03-07 LAB — CBC
HEMATOCRIT: 37.3 % — AB (ref 39.0–52.0)
Hemoglobin: 12.5 g/dL — ABNORMAL LOW (ref 13.0–17.0)
MCH: 34.1 pg — AB (ref 26.0–34.0)
MCHC: 33.5 g/dL (ref 30.0–36.0)
MCV: 101.6 fL — AB (ref 78.0–100.0)
Platelets: 258 10*3/uL (ref 150–400)
RBC: 3.67 MIL/uL — ABNORMAL LOW (ref 4.22–5.81)
RDW: 13.1 % (ref 11.5–15.5)
WBC: 5.9 10*3/uL (ref 4.0–10.5)

## 2017-03-07 LAB — BASIC METABOLIC PANEL
Anion gap: 7 (ref 5–15)
BUN: 5 mg/dL — AB (ref 6–20)
CALCIUM: 9.1 mg/dL (ref 8.9–10.3)
CO2: 31 mmol/L (ref 22–32)
CREATININE: 0.76 mg/dL (ref 0.61–1.24)
Chloride: 92 mmol/L — ABNORMAL LOW (ref 101–111)
GFR calc Af Amer: >60 mL/min (ref >60)
GFR calc non Af Amer: >60 mL/min (ref >60)
GLUCOSE: 107 mg/dL — AB (ref 65–99)
Potassium: 3.7 mmol/L (ref 3.5–5.1)
Sodium: 130 mmol/L — ABNORMAL LOW (ref 135–145)

## 2017-03-07 MED ORDER — GADOBENATE DIMEGLUMINE 529 MG/ML IV SOLN
20.0000 mL | Freq: Once | INTRAVENOUS | Status: AC | PRN
  Administered 2017-03-07: 20 mL via INTRAVENOUS

## 2017-03-07 MED ORDER — KETOROLAC TROMETHAMINE 15 MG/ML IJ SOLN
15.0000 mg | Freq: Once | INTRAMUSCULAR | Status: AC
  Administered 2017-03-07: 15 mg via INTRAVENOUS
  Filled 2017-03-07: qty 1

## 2017-03-07 NOTE — Progress Notes (Signed)
Family Medicine Teaching Service Daily Progress Note Intern Pager: [phone]  Patient name: [name] Medical record number: [id number] Date of birth: 1966-01-21 Age: 51 y.o. Gender: male  Primary Care Provider: Patient, No Pcp Per Consultants: ID Code Status: full  Pt Overview and Major Events to Date:  [name] is a 51 y.o. male presenting with back pain. 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   Assessment and Plan: [name] is a 51 y.o. male presenting with back pain. 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   Back pain: Thoracic xr shows no changes from prior admission.  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 Location of pain is at T11/12 level, but lateral to paraspinal muscles. No dysuria to suggest UTI with pyelo or renal stone. Additional possible etiology includes MSK related back pain given patient's large abdominal girth.  -serial neurological exam for any new symptoms -see osteomyelitis below    T11/T12 Osteomyelitis/Discitis/abscess: 2 admissions this month for known osteomyelitis and discitis of T11-T12.  Patient seen by ID on last admission who recommended we continue his fluconazole. s/p IR abscess drainage. Afebrile in ED. MRI stable as above. -observe on med-surg, Attending Dr. Andria Frames - Vitals per floor - suboxone cancelled per discussion with Dr. Daryll Drown about his suitability for this treatment - d/d fluzonazole 800mg  daily per ID -eraxis added per ID -blood culture pending -ID said to hold on neurosurgery pending their  assesment  Potential fungal endocarditis- prior admission TEE showed potential endocarditis in setting of known candidal spinal infection -patient getting [facility] cardiac imaging this AM -blood culture pending  Chest pain- none current. serial troponins were neg x3. -monitor QT with fluconazole -repeat ecg ordered 10/17  Hx of Tobacco Use Disorder.Patient has long history of tobacco use. Currently smokes.  - Consider starting nicotine patch if patient requests  Hx of Alcohol Use Disorder:CIWAS cancelled due to recent detox  Opiate use disorder: -COWS q4 -will treat with clonidine if needed, not opiates  XTG:GYIRSWN states he was diagnosed with CHF in prison recently. He states he is not currently taking any medications and was never started on any. He is not having any active signs or symptoms of CHF exacerbation at this time. ECHO 02/19/17 with EF of 50-55%.  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 - Continue fluconazole 800mg  for 6 months to 1 year per ID  Hypertension:  160/99.  was provided for free through [facility] program and delivered to patient prior to discharge. Last bp125/97. - Continue lisinopril 40mg  qd - Hydralazine PRN SBP >170  Constipation: states no bowel movements since last admission. -miralax  Hepatitis C: Diagnosed with Hep C at last admission. Hep C vrs RNA positive from 01/3017.Has not been to follow up appointment.  - Follow up outpatient as scheduled  Assymptomatic Hyponatremia:  chronic and likely given alcohol use.   -no intervention as patient is chronic and assymptomatic  FEN/GI: heart healthy PPx: hep subq  Disposition: likely to community pending IDs clearance  Subjective:  At [facility], unable to examine  Objective: Temp:  [97.9 F (36.6 C)-98.7 F (37.1 C)]  98.7 F (37.1 C) (10/18 0408) Pulse  Rate:  [60-74] 60 (10/18 0408) Resp:  [18-20] 20 (10/18 0408) BP: (139-205)/(98-122) 147/106 (10/18 0408) SpO2:  [94 %-97 %] 96 % (10/18 0408) Weight:  [213 lb 13.5 oz (97 kg)] 213 lb 13.5 oz (97 kg) (10/18 0500) Physical Exam: At MRI  Laboratory:  Recent Labs Lab 03/05/17 0339 03/06/17 0449 03/07/17 0733  WBC 6.9 5.7 5.9  HGB 13.2 12.5* 12.5*  HCT 36.8* 36.7* 37.3*  PLT 251 231 258    Recent Labs Lab 03/05/17 0339 03/06/17 0449 03/07/17 0733  NA 130* 132* 130*  K 4.3 4.3 3.7  CL 95* 96* 92*  CO2 27 28 31   BUN 7 8 5*  CREATININE 0.66 0.68 0.76  CALCIUM 9.0 8.9 9.1  GLUCOSE 123* 125* 107*    Troponin neg x3 Hep A/B neg, HIV neg  Imaging/Diagnostic Tests: Dg Chest 2 View  Result Date: 03/05/2017 CLINICAL DATA:  Shortness of breath with chest pain EXAM: CHEST  2 VIEW COMPARISON:  02/16/2017 FINDINGS: Shallow depth of inspiration. Note consolidation or effusion. Prominent cardiomediastinal silhouette likely augmented by low lung volume. Stable compression deformity at the thoracolumbar junction. IMPRESSION: No active cardiopulmonary disease.  Stable mild bronchitic changes. Electronically Signed   By: Donavan Foil M.D.   On: 03/05/2017 00:03   Dg Thoracic Spine 2 View  Result Date: 03/05/2017 CLINICAL DATA:  Osteomyelitis T11-T12 with pain and new radiculopathy. No known injury. EXAM: THORACIC SPINE 2 VIEWS COMPARISON:  MRI 03/04/2017. FINDINGS: Diffuse osteopenia and degenerative change. Stable deformity of a lower thoracic vertebral bodies with what appear to be used destructive changes of what is most likely T12. Similar findings noted on prior MRI 06/29/2016. Findings again consistent with discitis/osteomyelitis at this level . IMPRESSION: 1. Persistent changes of discitis/ osteomyelitis T11-T12. Similar findings noted on prior MRI 03/04/2017 . 2. Diffuse osteopenia and degenerative change . Electronically Signed   By: Marcello Moores  Register   On: 03/05/2017 12:01      Sherene Sires, DO 03/07/2017, 9:47 AM PGY-1, [place] Intern pager: [phone], text pages welcome

## 2017-03-07 NOTE — Progress Notes (Signed)
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 Patient thought to have evidence of mitral vegetation on prior TEE which would be consistent with candidal endocarditis. In the past, has left AMA and not received IV therapy on discharge due to history significant for IVDU. Patient recently admitted for worsening back pain/radiculopathy and not a surgical candidate. Plain films do not suggests worsening disease process.  - 24 hr Tmax 98.7. WBC 5.9 - Blood Cultures x2 (10/16): NGTD  Plan: 1. He is currently on Eraxis 200mg  q24h, while monitoring cultures for recurrent fungemia, which would be more concerning for endocarditis 2. Repeat Culture shows No growth in 1 Day. If positive recommend repeat TEE. If no growth in 48-72 hrs, transition patient back to Fluconazole 800mg  Daily to complete a course of at least 6 months. Patient to follow up at [facility]. 3. Constipation - defer to primary team, maybe related to opiates   Active Problems:   Diskitis   Scheduled Meds: . cyclobenzaprine  10 mg Oral BID  . folic acid  1 mg Oral Daily  . heparin  5,000 Units Subcutaneous Q8H  . hydrochlorothiazide  12.5 mg Oral Daily  . lisinopril  40 mg Oral Daily  . multivitamin with minerals  1 tablet Oral Daily  . sodium chloride flush  10-40 mL Intracatheter Q12H  . thiamine  100 mg Oral Daily   Or  . thiamine  100 mg Intravenous Daily   Continuous  Infusions: . anidulafungin Stopped (03/06/17 2100)   PRN Meds:.acetaminophen **OR** acetaminophen, albuterol, hydrALAZINE, magnesium hydroxide, polyethylene glycol, senna-docusate, sodium chloride flush   SUBJECTIVE: Patient seen resting in his bed. Complains of pain similar to the day prior and constipation. No other complaints. Denies fevers and chills.  Review of Systems: Review of Systems  Constitutional: Negative.   HENT: Negative.   Respiratory: Negative.   Cardiovascular: Negative.   Skin: Negative.     Allergies  Allergen Reactions  . Penicillins     From childhood: Has patient had a PCN reaction causing immediate rash, facial/tongue/throat swelling, SOB or lightheadedness with hypotension: Unknown Has patient had a PCN reaction causing severe rash involving mucus membranes or skin necrosis: Unknown Has patient had a PCN reaction that required hospitalization: Unknown Has patient had a PCN reaction occurring within the last 10 years: No If all of the above answers are "NO", then may proceed with Cephalosporin use.     OBJECTIVE: Vitals:   03/07/17 0000 03/07/17 0300 03/07/17 0408 03/07/17 0500  BP: (!) 202/108 (!) 139/98 (!) 147/106   Pulse:   60   Resp:   20   Temp:   98.7 F (37.1 C)   TempSrc:   Oral   SpO2:   96%   Weight:    213 lb 13.5 oz (97 kg)  Height:    5\' 9"  (1.753 m)   Body mass index is 31.58 kg/m.  Physical Exam Constitutional: He is oriented to person, place, and time and well-developed, well-nourished, and in no distress.  HENT:  Head: Normocephalic and atraumatic.  Eyes: EOM are normal.  Cardiovascular: Normal rate, regular rhythm and normal heart sounds.   Pulmonary/Chest: Effort normal and breath sounds normal. No respiratory distress.  Abdominal: Bowel sounds are normal. He exhibits distension.  There is no tenderness.  Musculoskeletal: He exhibits no edema or deformity.  Neurological: He is alert and oriented to person, place, and  time.  Skin: Skin is warm and dry.   Lab Results Lab Results  Component Value Date   WBC 5.7 03/06/2017   HGB 12.5 (L) 03/06/2017   HCT 36.7 (L) 03/06/2017   MCV 102.8 (H) 03/06/2017   PLT 231 03/06/2017    Lab Results  Component Value Date   CREATININE 0.68 03/06/2017   BUN 8 03/06/2017   NA 132 (L) 03/06/2017   K 4.3 03/06/2017   CL 96 (L) 03/06/2017   CO2 28 03/06/2017    Lab Results  Component Value Date   ALT 51 02/26/2017   AST 42 (H) 02/26/2017   ALKPHOS 63 02/26/2017   BILITOT 1.1 02/26/2017     Microbiology: Recent Results (from the past 240 hour(s))  Culture, blood (routine x 2)     Status: None   Collection Time: 02/26/17  4:35 AM  Result Value Ref Range Status   Specimen Description BLOOD RIGHT HAND  Final   Special Requests   Final    BOTTLES DRAWN AEROBIC AND ANAEROBIC Blood Culture adequate volume   Culture NO GROWTH 5 DAYS  Final   Report Status 03/03/2017 FINAL  Final  Culture, blood (routine x 2)     Status: None   Collection Time: 02/26/17  4:47 AM  Result Value Ref Range Status   Specimen Description BLOOD LEFT HAND  Final   Special Requests   Final    BOTTLES DRAWN AEROBIC AND ANAEROBIC Blood Culture adequate volume   Culture NO GROWTH 5 DAYS  Final   Report Status 03/03/2017 FINAL  Final  Fungus Culture With Stain     Status: None   Collection Time: 03/01/17 10:18 AM  Result Value Ref Range Status   Fungus Stain Final report  Final   Fungus (Mycology) Culture Preliminary report  Final    Comment: (NOTE) Performed At: [facility] [place], [place] [id number] Lindon Romp MD VO:[id number]    Fungal Source TISSUE  Final    Comment: DISC T11 T12  Aerobic/Anaerobic Culture (surgical/deep wound)     Status: None   Collection Time: 03/01/17 10:18 AM  Result Value Ref Range Status   Specimen Description TISSUE DISC T11 T12  Final   Special Requests NONE  Final   Gram Stain   Final    ABUNDANT WBC PRESENT,  PREDOMINANTLY PMN NO ORGANISMS SEEN    Culture   Final    FEW CANDIDA ALBICANS CRITICAL RESULT CALLED TO, READ BACK BY AND VERIFIED WITH: C VAN DAM,MD AT 1200 03/03/17 BY L BENFIELD CONCERNING GROWTH ON CULTURE NO ANAEROBES ISOLATED    Report Status 03/06/2017 FINAL  Final  Fungus Culture Result     Status: None   Collection Time: 03/01/17 10:18 AM  Result Value Ref Range Status   Result 1 Comment  Final    Comment: (NOTE) KOH/Calcofluor preparation:  no fungus observed. Performed At: [facility] [place], [place] [id number] Lindon Romp MD OJ:[id number]   Fungal organism reflex     Status: None   Collection Time: 03/01/17 10:18 AM  Result Value Ref Range Status   Fungal result 1 Candida albicans  Corrected    Comment: (NOTE) Scant growth Performed At: [facility] [address] [place], [place] [id number] Lindon Romp MD LF:[id number] CORRECTED ON 10/17 AT 8527: PREVIOUSLY REPORTED  AS Comment   Culture, blood (Routine X 2) w Reflex to ID Panel     Status: None (Preliminary result)   Collection Time: 03/05/17  5:17 AM  Result Value Ref Range Status   Specimen Description BLOOD RIGHT ANTECUBITAL  Final   Special Requests   Final    BOTTLES DRAWN AEROBIC AND ANAEROBIC Blood Culture results may not be optimal due to an excessive volume of blood received in culture bottles   Culture NO GROWTH 1 DAY  Final   Report Status PENDING  Incomplete  Culture, blood (Routine X 2) w Reflex to ID Panel     Status: None (Preliminary result)   Collection Time: 03/05/17  5:21 AM  Result Value Ref Range Status   Specimen Description BLOOD RIGHT HAND  Final   Special Requests   Final    IN PEDIATRIC BOTTLE Blood Culture results may not be optimal due to an excessive volume of blood received in culture bottles   Culture NO GROWTH 1 DAY  Final   Report Status PENDING  Incomplete    Neva Seat, MD IM PGY-1 03/07/2017, 7:02 AM

## 2017-03-08 LAB — HEPATIC FUNCTION PANEL
ALBUMIN: 3.1 g/dL — AB (ref 3.5–5.0)
ALT: 55 U/L (ref 17–63)
AST: 47 U/L — ABNORMAL HIGH (ref 15–41)
Alkaline Phosphatase: 79 U/L (ref 38–126)
BILIRUBIN TOTAL: 1 mg/dL (ref 0.3–1.2)
Bilirubin, Direct: 0.3 mg/dL (ref 0.1–0.5)
Indirect Bilirubin: 0.7 mg/dL (ref 0.3–0.9)
Total Protein: 8.7 g/dL — ABNORMAL HIGH (ref 6.5–8.1)

## 2017-03-08 LAB — BASIC METABOLIC PANEL
Anion gap: 9 (ref 5–15)
BUN: 6 mg/dL (ref 6–20)
CHLORIDE: 91 mmol/L — AB (ref 101–111)
CO2: 31 mmol/L (ref 22–32)
CREATININE: 0.77 mg/dL (ref 0.61–1.24)
Calcium: 9.5 mg/dL (ref 8.9–10.3)
GFR calc Af Amer: >60 mL/min (ref >60)
GFR calc non Af Amer: >60 mL/min (ref >60)
Glucose, Bld: 126 mg/dL — ABNORMAL HIGH (ref 65–99)
POTASSIUM: 4 mmol/L (ref 3.5–5.1)
Sodium: 131 mmol/L — ABNORMAL LOW (ref 135–145)

## 2017-03-08 LAB — CBC
HEMATOCRIT: 37.2 % — AB (ref 39.0–52.0)
HEMOGLOBIN: 13 g/dL (ref 13.0–17.0)
MCH: 35 pg — ABNORMAL HIGH (ref 26.0–34.0)
MCHC: 34.9 g/dL (ref 30.0–36.0)
MCV: 100.3 fL — AB (ref 78.0–100.0)
Platelets: 257 10*3/uL (ref 150–400)
RBC: 3.71 MIL/uL — ABNORMAL LOW (ref 4.22–5.81)
RDW: 12.7 % (ref 11.5–15.5)
WBC: 6.8 10*3/uL (ref 4.0–10.5)

## 2017-03-08 MED ORDER — HYDROCHLOROTHIAZIDE 12.5 MG PO CAPS: 12.5000 mg | ORAL_CAPSULE | Freq: Every day | ORAL | 0 refills | Status: DC

## 2017-03-08 MED ORDER — SENNA 8.6 MG PO TABS
1.0000 | ORAL_TABLET | Freq: Once | ORAL | Status: AC
  Administered 2017-03-08: 8.6 mg via ORAL
  Filled 2017-03-08: qty 1

## 2017-03-08 MED ORDER — POLYETHYLENE GLYCOL 3350 17 G PO PACK
17.0000 g | PACK | Freq: Once | ORAL | Status: AC
  Administered 2017-03-08: 17 g via ORAL
  Filled 2017-03-08: qty 1

## 2017-03-08 NOTE — Progress Notes (Signed)
ID PROGRESS NOTE  51yo M with candidal vertebral osteomyelitis started on oral fluconazole 800mg  daily as of early October.  Readmitted for worsening pain. Repeat blood cx does not suggest ongoing fungemia  Plan to discharge back on fluconazole 800mg  po daily Will have him follow up in the ID clinic in 2-3 wk for follow up and lab work Anticipate to stay on antifungal therapy high dose for 6-8 wk if tolerated, then 400mg  daily for up to 6 months   He is not a candidate for IV picc line at home.  Recommend to seek substance abuse counseling  Caren Griffins B[facility] [facility] [phone]

## 2017-03-08 NOTE — Progress Notes (Signed)
Family Medicine Teaching Service Daily Progress Note Intern Pager: [phone]  Patient name: [name] Medical record number: [id number] Date of birth: 1965/09/29 Age: 51 y.o. Gender: male  Primary Care Provider: Patient, No Pcp Per Consultants: ID Code Status: full  Pt Overview and Major Events to Date:  [name] is a 51 y.o. male presenting with back pain. 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   Assessment and Plan: Treshaun Carrico is a 51 y.o. male presenting with back pain. 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   Back pain: Thoracic xr shows no changes from prior admission.  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 Location of pain is at T11/12 level, but lateral to paraspinal muscles. No dysuria to suggest UTI with pyelo or renal stone. Additional possible etiology includes MSK related back pain given patient's large abdominal girth.  -serial neurological exam for any new symptoms -see osteomyelitis below    T11/T12 Osteomyelitis/Discitis/abscess: 2 admissions this month for known osteomyelitis and discitis of T11-T12.  Patient seen by ID on last admission who recommended we continue his fluconazole. s/p IR abscess drainage. Afebrile in ED. MRI stable as above. -observe on med-surg, Attending Dr. Andria Frames - Vitals per floor -blood culutres neg x2 days -fluzonazole 800mg  daily per ID for 57months, will see in clinic in 2-3 wks will sign off -d/c eraxis  per ID  Potential fungal endocarditis- prior admission TEE showed potential endocarditis in setting of  known candidal spinal infection -blood culture ned x2 days so far, no further workup, treat outpatient with fluconazole  Chest pain- none current. serial troponins were neg x3. -monitor QT with fluconazole -repeat ecg ordered 10/17  Hx of Tobacco Use Disorder.Patient has long history of tobacco use. Currently smokes.  - Consider starting nicotine patch if patient requests  Hx of Alcohol Use Disorder:CIWAS cancelled due to recent detox  Opiate use disorder: -COWS q4 -will treat with clonidine if needed, not opiates  IRJ:JOACZYS states he was diagnosed with CHF in prison recently. He states he is not currently taking any medications and was never started on any. He is not having any active signs or symptoms of CHF exacerbation at this time. ECHO 02/19/17 with EF of 50-55%.  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 - Continue fluconazole 800mg  for 6 months to 1 year per ID  Hypertension:  160/99.  was provided for free through [facility] program and delivered to patient prior to discharge. Last bp125/97. - Continue lisinopril 40mg  qd -hctz 12.5 - Hydralazine PRN SBP >170  Hepatitis C: Diagnosed with Hep C at last admission. Hep C vrs RNA positive from 01/3017.Has not been to follow up appointment.  - Follow up outpatient as scheduled  Assymptomatic Hyponatremia:  chronic and likely given alcohol use.   -no intervention as patient is chronic and assymptomatic  Constipation: Patient complains of no bowel movements for 2 days -AM miralax -senna  FEN/GI: heart healthy PPx: hep subq  Disposition: to home today s/p ID clearance  Subjective:  Patient frustrated with lack of opiate pain medication, wants dilaudid.   We discussed that opioids  were not the treatment we felt was most appropriate for his chronic back  pain.  Objective: Temp:  [97.5 F (36.4 C)-99.4 F (37.4 C)] 99.4 F (37.4 C) (10/18 2138) Pulse Rate:  [77-80] 77 (10/19 0612) Resp:  [18-20] 20 (10/19 0612) BP: (148-176)/(93-124) 148/93 (10/19 0612) SpO2:  [95 %-99 %] 95 % (10/19 0612) Physical Exam: General: uncomfortable, no acute distress Cardiovascular: RRR, normal s1 and s2, no murmurs appreciated Respiratory: Lungs course but no crackles/wheezing, normal WOB on RA Abdomen: Soft, non-tender, bowel sounds present Extremities: 5/5 strength BUE, BLE (pain in LE on movement) Back: no tenderness over spinal processes, tenderness over musculature about 4 inches lateral tospine ~t-11, no lesions noted Skin: Warm and dry Neuro: A&Ox3 Psych: Appropriate mood and affect  Laboratory:  Recent Labs Lab 03/06/17 0449 03/07/17 0733 03/08/17 0345  WBC 5.7 5.9 6.8  HGB 12.5* 12.5* 13.0  HCT 36.7* 37.3* 37.2*  PLT 231 258 257    Recent Labs Lab 03/06/17 0449 03/07/17 0733 03/08/17 0345  NA 132* 130* 131*  K 4.3 3.7 4.0  CL 96* 92* 91*  CO2 28 31 31   BUN 8 5* 6  CREATININE 0.68 0.76 0.77  CALCIUM 8.9 9.1 9.5  GLUCOSE 125* 107* 126*    Troponin neg x3 Hep A/B neg, HIV neg  Imaging/Diagnostic Tests: Dg Chest 2 View  Result Date: 03/05/2017 CLINICAL DATA:  Shortness of breath with chest pain EXAM: CHEST  2 VIEW COMPARISON:  02/16/2017 FINDINGS: Shallow depth of inspiration. Note consolidation or effusion. Prominent cardiomediastinal silhouette likely augmented by low lung volume. Stable compression deformity at the thoracolumbar junction. IMPRESSION: No active cardiopulmonary disease.  Stable mild bronchitic changes. Electronically Signed   By: Donavan Foil M.D.   On: 03/05/2017 00:03   Dg Thoracic Spine 2 View  Result Date: 03/05/2017 CLINICAL DATA:  Osteomyelitis T11-T12 with pain and new radiculopathy. No known injury. EXAM: THORACIC SPINE 2 VIEWS COMPARISON:  MRI 03/04/2017. FINDINGS: Diffuse osteopenia and  degenerative change. Stable deformity of a lower thoracic vertebral bodies with what appear to be used destructive changes of what is most likely T12. Similar findings noted on prior MRI 06/29/2016. Findings again consistent with discitis/osteomyelitis at this level . IMPRESSION: 1. Persistent changes of discitis/ osteomyelitis T11-T12. Similar findings noted on prior MRI 03/04/2017 . 2. Diffuse osteopenia and degenerative change . Electronically Signed   By: Marcello Moores  Register   On: 03/05/2017 12:01     Sherene Sires, DO 03/08/2017, 7:07 AM PGY-1, [place] Intern pager: [phone], text pages welcome

## 2017-03-09 NOTE — Progress Notes (Signed)
Pt discharged in stable condition, all discharge instructions and Rx's X 1 given to pt.  Pt transported via wheelchair with transporters x1 at chairside, all belongings taken with pt.  AKingRNBSN

## 2017-03-09 NOTE — Progress Notes (Signed)
Pt discharged in stable condition, all discharge instructions and Rx's x 1 given to pt. Pt and belongings transported via wheelchair with transporters x 1 at chairside.  AKingRNBSN

## 2017-03-10 ENCOUNTER — Encounter (HOSPITAL_COMMUNITY): Payer: Self-pay | Admitting: Emergency Medicine

## 2017-03-10 ENCOUNTER — Emergency Department (HOSPITAL_COMMUNITY)
Admission: EM | Admit: 2017-03-10 | Discharge: 2017-03-10 | Disposition: A | Discharge status: Home / Self Care | Payer: Self-pay | Attending: Emergency Medicine | Admitting: Emergency Medicine

## 2017-03-10 DIAGNOSIS — F17210 Nicotine dependence, cigarettes, uncomplicated: Secondary | ICD-10-CM | POA: Insufficient documentation

## 2017-03-10 DIAGNOSIS — Z79899 Other long term (current) drug therapy: Secondary | ICD-10-CM | POA: Insufficient documentation

## 2017-03-10 DIAGNOSIS — J449 Chronic obstructive pulmonary disease, unspecified: Secondary | ICD-10-CM | POA: Insufficient documentation

## 2017-03-10 DIAGNOSIS — I110 Hypertensive heart disease with heart failure: Secondary | ICD-10-CM | POA: Insufficient documentation

## 2017-03-10 DIAGNOSIS — I509 Heart failure, unspecified: Secondary | ICD-10-CM | POA: Insufficient documentation

## 2017-03-10 DIAGNOSIS — M4644 Discitis, unspecified, thoracic region: Secondary | ICD-10-CM

## 2017-03-10 DIAGNOSIS — M549 Dorsalgia, unspecified: Secondary | ICD-10-CM

## 2017-03-10 LAB — CULTURE, BLOOD (ROUTINE X 2)
CULTURE: NO GROWTH
Culture: NO GROWTH

## 2017-03-10 LAB — CULTURE, FUNGUS WITHOUT SMEAR

## 2017-03-10 MED ORDER — HYDROMORPHONE HCL 2 MG/ML IJ SOLN
2.0000 mg | Freq: Once | INTRAMUSCULAR | Status: AC
  Administered 2017-03-10: 2 mg via INTRAMUSCULAR
  Filled 2017-03-10: qty 1

## 2017-03-10 MED ORDER — ONDANSETRON 8 MG PO TBDP
8.0000 mg | ORAL_TABLET | Freq: Once | ORAL | Status: AC
  Administered 2017-03-10: 8 mg via ORAL
  Filled 2017-03-10: qty 1

## 2017-03-10 MED ORDER — OXYCODONE-ACETAMINOPHEN 5-325 MG PO TABS: 1.0000 | ORAL_TABLET | ORAL | 0 refills | Status: DC | PRN

## 2017-03-10 NOTE — ED Triage Notes (Signed)
Pt presents by EMS for increasing back pain after being discharged on 03/09/17 for osteomyelitis and was given antibiotics and was prescribed medications to take and has not gotten medication filled. Pt now having increasing pain in legs

## 2017-03-10 NOTE — Discharge Instructions (Signed)
You need to make sure that you take your fluconazole every day, as prescribed. You may need to be referred to a pain management clinic.
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
## 2017-03-11 ENCOUNTER — Ambulatory Visit (INDEPENDENT_AMBULATORY_CARE_PROVIDER_SITE_OTHER): Payer: Self-pay | Admitting: Physician Assistant

## 2017-03-11 ENCOUNTER — Encounter (INDEPENDENT_AMBULATORY_CARE_PROVIDER_SITE_OTHER): Payer: Self-pay | Admitting: Physician Assistant

## 2017-03-11 VITALS — BP 120/78 | HR 92 | Temp 97.6°F | Wt 210.2 lb

## 2017-03-11 DIAGNOSIS — I10 Essential (primary) hypertension: Secondary | ICD-10-CM

## 2017-03-11 DIAGNOSIS — J42 Unspecified chronic bronchitis: Secondary | ICD-10-CM

## 2017-03-11 DIAGNOSIS — M869 Osteomyelitis, unspecified: Secondary | ICD-10-CM

## 2017-03-11 DIAGNOSIS — R7303 Prediabetes: Secondary | ICD-10-CM

## 2017-03-11 DIAGNOSIS — B376 Candidal endocarditis: Secondary | ICD-10-CM

## 2017-03-11 MED ORDER — ALBUTEROL SULFATE HFA 108 (90 BASE) MCG/ACT IN AERS: 2.0000 | INHALATION_SPRAY | Freq: Four times a day (QID) | RESPIRATORY_TRACT | 5 refills | Status: DC | PRN

## 2017-03-11 MED ORDER — METFORMIN HCL 500 MG PO TABS: 500.0000 mg | ORAL_TABLET | Freq: Two times a day (BID) | ORAL | 5 refills | Status: DC

## 2017-03-11 MED ORDER — IBUPROFEN 800 MG PO TABS: 800.0000 mg | ORAL_TABLET | Freq: Three times a day (TID) | ORAL | 0 refills | Status: DC | PRN

## 2017-03-11 MED ORDER — FLUCONAZOLE 200 MG PO TABS: 800.0000 mg | ORAL_TABLET | Freq: Every day | ORAL | 5 refills | Status: DC

## 2017-03-11 MED ORDER — LISINOPRIL 40 MG PO TABS: 40.0000 mg | ORAL_TABLET | Freq: Every day | ORAL | 3 refills | Status: DC

## 2017-03-11 NOTE — Patient Instructions (Signed)
Endocarditis Endocarditis is an infection of the inner layer of the heart (endocardium) or an infection of the heart valves. Endocarditis can cause growths inside the heart or on the heart valves. Over time, these growths can destroy heart tissue and cause heart failure or problems with heart rhythm. They can also cause stroke if they break away and form a blood clot in the brain. Early treatment offers the best chance for curing endocarditis and preventing complications. What are the causes? This condition may be caused by:  Germs that normally live in or on your body. The germs that most commonly cause endocarditis are bacteria.  A fungus.  What increases the risk? This condition is more likely to develop in people who have:  A heart defect.  Artificial (prosthetic) heart valves.  An abnormal or damaged heart valve.  A history of endocarditis.  Having certain procedures may also increase the risk of germs getting into the heart or bloodstream. What are the signs or symptoms? Symptoms of this condition may start suddenly, or they may start slowly and gradually get worse. Symptoms include:  Fever.  Chills.  Night sweats.  Muscle aches.  Fatigue.  Weakness.  Shortness of breath.  Chest pain.  Blood spots in the eyes.  Bleeding under the fingernails or toenails.  Painless red spots on the palms.  Painful lumps in the fingertips or toes.  Swelling in the feet or ankles.  How is this diagnosed? This condition may be diagnosed based on:  A physical exam. Your health care provider will listen to your heart to check for abnormal heart sounds (murmur). He or she may also use a scope to check for bleeding at the back of your eyes (retinas).  Tests. They may include: ? Blood tests to look for the germs that cause endocarditis. ? Imaging tests. A chest x-ray, CT scan, or echocardiogram may be used to create an image of your heart. A type of echocardiogram called a  transesophageal echocardiogram may be done to look at certain heart valves more closely.  How is this treated? Treatment for this condition depends on the cause of the endocarditis. Treatment may include:  Antibiotic medicines. These may be given through a tube into one of your veins (IV antibiotics) or taken by mouth. You may need to be on more than one antibiotic medicine.  Surgery to replace your heart valve. You may need surgery if: ? The endocarditis does not respond to treatment. ? You develop complications. ? Your heart valve is severely damaged.  Follow these instructions at home: Medicines  Take over-the-counter and prescription medicines only as told by your health care provider.  If you were prescribed an antibiotic medicine, take it as told by your health care provider. Do not stop taking the antibiotic even if you start to feel better. You may need to be on intravenous antibiotics for several weeks.  Do not use IV drugs unless it is part of your medical treatment. Lifestyle  Do not get tattoos or body piercings.  Practice good oral hygiene. This includes: ? Brushing and flossing regularly. ? Scheduling routine dental appointments.  Do not use any products that contain nicotine or tobacco, such as cigarettes and e-cigarettes. If you need help quitting, ask your health care provider.  Limit alcohol intake to no more than 1 drink a day for nonpregnant women and 2 drinks a day for men. One drink equals 12 oz of beer, 5 oz of wine, or 1 oz of hard liquor.  General instructions  Let your health care provider know before you have any dental or surgical procedures. You may need to take antibiotics before the procedure.  Tell all of your health care providers, including your dentist, that you have had endocarditis.  Gradually resume your usual activities.  Keep all follow-up visits as told by your health care provider. This is important. Contact a health care provider  if:  You have a fever.  Your symptoms do not improve.  Your symptoms get worse.  Your symptoms come back. Get help right away if:  You have trouble breathing.  You have chest pain.  You have symptoms of a stroke. These include: ? Sudden weakness. ? Numbness. ? Confusion. ? Trouble talking or understanding. ? A severe headache. Summary  Endocarditis is an infection of the inner layer of the heart (endocardium) or heart valves. It is caused by bacteria or a fungus.  Having certain heart conditions or procedures may increase the risk of endocarditis.  Antibiotics are an important treatment for endocarditis. Take these medicines as told by your health care provider. Do not stop taking them even if you start to feel better.  Tell all of your health care providers, including your dentist, that you have had endocarditis. This information is not intended to replace advice given to you by your health care provider. Make sure you discuss any questions you have with your health care provider. Document Released: 05/07/2005 Document Revised: 02/17/2016 Document Reviewed: 02/17/2016 Elsevier Interactive Patient Education  [employer].

## 2017-03-11 NOTE — Progress Notes (Signed)
Subjective:  Patient ID: [name], male    DOB: 01-Jun-1965  Age: 51 y.o. MRN: [id number]  CC: back pain   HPI Jahmire Ruffins is a 51 y.o. male with a medical history of CHF, chronic bronchitis, chronic lower back pain, COPD, discitis, panic attacks, heroin use, and tobacco abuse presents as a new patient with complaint of mid back pain. Went to the ED yesterday and was diagnosed with mid back pain 2/2 Candida osteomyelitis found during his recent hospital admission. Has candidal endocarditis also. Taking his fluconazole 800 mg as directed. Has an Infectious Disease appointment on 03/18/17. Denies CP, palpitations, SOB, HA, abdominal pain, or urinary sxs.    Out of work for one month with no income coming in at all. Says he has not been able to afford the Percocet 10-325 mg prescribed to him at the hospital.   Outpatient Medications Prior to Visit  Medication Sig Dispense Refill  . fluconazole (DIFLUCAN) 200 MG tablet Take 4 tablets (800 mg total) by mouth daily. 30 tablet 5  . lisinopril (PRINIVIL,ZESTRIL) 40 MG tablet Take 1 tablet (40 mg total) by mouth daily. 30 tablet 0  . albuterol (PROVENTIL HFA;VENTOLIN HFA) 108 (90 Base) MCG/ACT inhaler Inhale 2 puffs into the lungs every 6 (six) hours as needed for wheezing or shortness of breath. (Patient not taking: Reported on 03/11/2017) 1 Inhaler 2  . hydrochlorothiazide (MICROZIDE) 12.5 MG capsule Take 1 capsule (12.5 mg total) by mouth daily. (Patient not taking: Reported on 03/11/2017) 30 capsule 0  . oxyCODONE-acetaminophen (PERCOCET) 5-325 MG tablet Take 1 tablet by mouth every 4 (four) hours as needed for moderate pain. (Patient not taking: Reported on 03/11/2017) 10 tablet 0   No facility-administered medications prior to visit.      ROS Review of Systems  Constitutional: Negative for chills, fever and malaise/fatigue.  Eyes: Negative for blurred vision.  Respiratory: Negative for shortness of breath.    Cardiovascular: Negative for chest pain and palpitations.  Gastrointestinal: Negative for abdominal pain and nausea.  Genitourinary: Negative for dysuria and hematuria.  Musculoskeletal: Positive for back pain. Negative for joint pain and myalgias.  Skin: Negative for rash.  Neurological: Negative for tingling and headaches.  Psychiatric/Behavioral: Negative for depression. The patient is not nervous/anxious.     Objective:  BP 120/78 (BP Location: Left Arm, Patient Position: Sitting, Cuff Size: Normal)   Pulse 92   Temp 97.6 F (36.4 C) (Oral)   Wt 210 lb 3.2 oz (95.3 kg)   SpO2 96%   BMI 31.96 kg/m   BP/Weight 03/11/2017 03/10/2017 93/71/6967  Systolic BP 893 810 175  Diastolic BP 78 77 102  Wt. (Lbs) 210.2 213 -  BMI 31.96 32.39 -      Physical Exam  Constitutional: He is oriented to person, place, and time.  Well developed, overweight, NAD, polite, malodorous  HENT:  Head: Normocephalic and atraumatic.  Eyes: No scleral icterus.  Cardiovascular: Normal rate, regular rhythm and normal heart sounds.  Exam reveals no gallop and no friction rub.   No murmur heard. Pulmonary/Chest: Effort normal and breath sounds normal. No respiratory distress. He has no wheezes. He has no rales.  Musculoskeletal: He exhibits no edema.  Neurological: He is alert and oriented to person, place, and time. No cranial nerve deficit. Coordination normal.  Skin: Skin is warm and dry. No rash noted. No erythema. No pallor.  Psychiatric: He has a normal mood and affect. His behavior is normal. Thought content normal.  Vitals reviewed.    Assessment & Plan:   1. Osteomyelitis of multiple sites, unspecified type ([place]) - Ambulatory referral to Pain Clinic - Continue fluconazole (DIFLUCAN) 200 MG tablet; Take 4 tablets (800 mg total) by mouth daily.  Dispense: 120 tablet; Refill: 5  2. Acute candidal endocarditis - Begin ibuprofen (ADVIL,MOTRIN) 800 MG tablet; Take 1 tablet (800 mg total) by  mouth every 8 (eight) hours as needed.  Dispense: 30 tablet; Refill: 0 - Ambulatory referral to Cardiology - Comprehensive metabolic panel - CBC with Differential  3. Hypertension, unspecified type - Refill lisinopril (PRINIVIL,ZESTRIL) 40 MG tablet; Take 1 tablet (40 mg total) by mouth daily.  Dispense: 90 tablet; Refill: 3  4. Prediabetes - Begin metFORMIN (GLUCOPHAGE) 500 MG tablet; Take 1 tablet (500 mg total) by mouth 2 (two) times daily with a meal.  Dispense: 60 tablet; Refill: 5  5. Chronic bronchitis, unspecified chronic bronchitis type ([facility]) - Refill albuterol (PROVENTIL HFA;VENTOLIN HFA) 108 (90 Base) MCG/ACT inhaler; Inhale 2 puffs into the lungs every 6 (six) hours as needed for wheezing or shortness of breath.  Dispense: 1 Inhaler; Refill: 5 - Refill ibuprofen (ADVIL,MOTRIN) 800 MG tablet; Take 1 tablet (800 mg total) by mouth every 8 (eight) hours as needed.  Dispense: 30 tablet; Refill: 0   Meds ordered this encounter  Medications  . albuterol (PROVENTIL HFA;VENTOLIN HFA) 108 (90 Base) MCG/ACT inhaler    Sig: Inhale 2 puffs into the lungs every 6 (six) hours as needed for wheezing or shortness of breath.    Dispense:  1 Inhaler    Refill:  5    Order Specific Question:   Supervising Provider    Answer:   Tresa Garter [id number]  . lisinopril (PRINIVIL,ZESTRIL) 40 MG tablet    Sig: Take 1 tablet (40 mg total) by mouth daily.    Dispense:  90 tablet    Refill:  3    Order Specific Question:   Supervising Provider    Answer:   Tresa Garter [id number]  . fluconazole (DIFLUCAN) 200 MG tablet    Sig: Take 4 tablets (800 mg total) by mouth daily.    Dispense:  120 tablet    Refill:  5    Order Specific Question:   Supervising Provider    Answer:   Tresa Garter [id number]  . metFORMIN (GLUCOPHAGE) 500 MG tablet    Sig: Take 1 tablet (500 mg total) by mouth 2 (two) times daily with a meal.    Dispense:  60 tablet    Refill:  5    Order Specific  Question:   Supervising Provider    Answer:   Tresa Garter [id number]  . ibuprofen (ADVIL,MOTRIN) 800 MG tablet    Sig: Take 1 tablet (800 mg total) by mouth every 8 (eight) hours as needed.    Dispense:  30 tablet    Refill:  0    Order Specific Question:   Supervising Provider    Answer:   Tresa Garter [id number]    Follow-up: Return in about 4 weeks (around 04/08/2017).   Clent Demark PA

## 2017-03-12 LAB — CBC WITH DIFFERENTIAL/PLATELET
Basophils Absolute: 0 10*3/uL (ref 0.0–0.2)
Basos: 0 %
EOS (ABSOLUTE): 0.1 10*3/uL (ref 0.0–0.4)
EOS: 1 %
HEMATOCRIT: 41.3 % (ref 37.5–51.0)
Hemoglobin: 14.2 g/dL (ref 13.0–17.7)
Immature Grans (Abs): 0 10*3/uL (ref 0.0–0.1)
Immature Granulocytes: 1 %
LYMPHS ABS: 1.8 10*3/uL (ref 0.7–3.1)
Lymphs: 26 %
MCH: 34.6 pg — AB (ref 26.6–33.0)
MCHC: 34.4 g/dL (ref 31.5–35.7)
MCV: 101 fL — ABNORMAL HIGH (ref 79–97)
Monocytes Absolute: 0.5 10*3/uL (ref 0.1–0.9)
Monocytes: 8 %
Neutrophils Absolute: 4.5 10*3/uL (ref 1.4–7.0)
Neutrophils: 64 %
Platelets: 324 10*3/uL (ref 150–379)
RBC: 4.1 x10E6/uL — AB (ref 4.14–5.80)
RDW: 13.2 % (ref 12.3–15.4)
WBC: 6.9 10*3/uL (ref 3.4–10.8)

## 2017-03-12 LAB — COMPREHENSIVE METABOLIC PANEL
A/G RATIO: 0.8 — AB (ref 1.2–2.2)
ALBUMIN: 4 g/dL (ref 3.5–5.5)
ALK PHOS: 89 IU/L (ref 39–117)
ALT: 70 IU/L — ABNORMAL HIGH (ref 0–44)
AST: 45 IU/L — AB (ref 0–40)
BILIRUBIN TOTAL: 0.6 mg/dL (ref 0.0–1.2)
BUN / CREAT RATIO: 18 (ref 9–20)
BUN: 16 mg/dL (ref 6–24)
CO2: 27 mmol/L (ref 20–29)
CREATININE: 0.91 mg/dL (ref 0.76–1.27)
Calcium: 10.4 mg/dL — ABNORMAL HIGH (ref 8.7–10.2)
Chloride: 92 mmol/L — ABNORMAL LOW (ref 96–106)
GFR calc Af Amer: 112 mL/min/{1.73_m2} (ref >59)
GFR calc non Af Amer: 97 mL/min/{1.73_m2} (ref >59)
GLOBULIN, TOTAL: 5.3 g/dL — AB (ref 1.5–4.5)
Glucose: 94 mg/dL (ref 65–99)
POTASSIUM: 4.5 mmol/L (ref 3.5–5.2)
Sodium: 137 mmol/L (ref 134–144)
Total Protein: 9.3 g/dL — ABNORMAL HIGH (ref 6.0–8.5)

## 2017-03-13 ENCOUNTER — Emergency Department (HOSPITAL_COMMUNITY): Payer: Self-pay

## 2017-03-13 ENCOUNTER — Emergency Department (HOSPITAL_COMMUNITY)
Admission: EM | Admit: 2017-03-13 | Discharge: 2017-03-13 | Disposition: A | Discharge status: Home / Self Care | Payer: Self-pay | Attending: Emergency Medicine | Admitting: Emergency Medicine

## 2017-03-13 ENCOUNTER — Encounter (HOSPITAL_COMMUNITY): Payer: Self-pay | Admitting: *Deleted

## 2017-03-13 DIAGNOSIS — F17210 Nicotine dependence, cigarettes, uncomplicated: Secondary | ICD-10-CM | POA: Insufficient documentation

## 2017-03-13 DIAGNOSIS — M5441 Lumbago with sciatica, right side: Secondary | ICD-10-CM | POA: Insufficient documentation

## 2017-03-13 DIAGNOSIS — J449 Chronic obstructive pulmonary disease, unspecified: Secondary | ICD-10-CM | POA: Insufficient documentation

## 2017-03-13 DIAGNOSIS — I110 Hypertensive heart disease with heart failure: Secondary | ICD-10-CM | POA: Insufficient documentation

## 2017-03-13 DIAGNOSIS — K5900 Constipation, unspecified: Secondary | ICD-10-CM | POA: Insufficient documentation

## 2017-03-13 DIAGNOSIS — I509 Heart failure, unspecified: Secondary | ICD-10-CM | POA: Insufficient documentation

## 2017-03-13 DIAGNOSIS — Z7984 Long term (current) use of oral hypoglycemic drugs: Secondary | ICD-10-CM | POA: Insufficient documentation

## 2017-03-13 DIAGNOSIS — M5442 Lumbago with sciatica, left side: Secondary | ICD-10-CM | POA: Insufficient documentation

## 2017-03-13 DIAGNOSIS — Z79899 Other long term (current) drug therapy: Secondary | ICD-10-CM | POA: Insufficient documentation

## 2017-03-13 LAB — CBC WITH DIFFERENTIAL/PLATELET
Basophils Absolute: 0 10*3/uL (ref 0.0–0.1)
Basophils Relative: 0 %
Eosinophils Absolute: 0.1 10*3/uL (ref 0.0–0.7)
Eosinophils Relative: 2 %
HEMATOCRIT: 42.8 % (ref 39.0–52.0)
Hemoglobin: 14.9 g/dL (ref 13.0–17.0)
LYMPHS PCT: 27 %
Lymphs Abs: 1.9 10*3/uL (ref 0.7–4.0)
MCH: 35.4 pg — AB (ref 26.0–34.0)
MCHC: 34.8 g/dL (ref 30.0–36.0)
MCV: 101.7 fL — AB (ref 78.0–100.0)
MONO ABS: 0.4 10*3/uL (ref 0.1–1.0)
MONOS PCT: 6 %
NEUTROS ABS: 4.5 10*3/uL (ref 1.7–7.7)
Neutrophils Relative %: 65 %
Platelets: 305 10*3/uL (ref 150–400)
RBC: 4.21 MIL/uL — ABNORMAL LOW (ref 4.22–5.81)
RDW: 13 % (ref 11.5–15.5)
WBC: 6.9 10*3/uL (ref 4.0–10.5)

## 2017-03-13 LAB — BASIC METABOLIC PANEL
Anion gap: 9 (ref 5–15)
BUN: 10 mg/dL (ref 6–20)
CALCIUM: 9.9 mg/dL (ref 8.9–10.3)
CO2: 30 mmol/L (ref 22–32)
CREATININE: 0.84 mg/dL (ref 0.61–1.24)
Chloride: 94 mmol/L — ABNORMAL LOW (ref 101–111)
GFR calc Af Amer: >60 mL/min (ref >60)
GFR calc non Af Amer: >60 mL/min (ref >60)
GLUCOSE: 113 mg/dL — AB (ref 65–99)
Potassium: 3.9 mmol/L (ref 3.5–5.1)
Sodium: 133 mmol/L — ABNORMAL LOW (ref 135–145)

## 2017-03-13 LAB — URINALYSIS, ROUTINE W REFLEX MICROSCOPIC
Bilirubin Urine: NEGATIVE
GLUCOSE, UA: NEGATIVE mg/dL
Hgb urine dipstick: NEGATIVE
KETONES UR: NEGATIVE mg/dL
LEUKOCYTES UA: NEGATIVE
Nitrite: NEGATIVE
PH: 5 (ref 5.0–8.0)
Protein, ur: NEGATIVE mg/dL
Specific Gravity, Urine: 1.02 (ref 1.005–1.030)

## 2017-03-13 MED ORDER — ONDANSETRON HCL 4 MG/2ML IJ SOLN
4.0000 mg | Freq: Once | INTRAMUSCULAR | Status: AC
  Administered 2017-03-13: 4 mg via INTRAVENOUS
  Filled 2017-03-13: qty 2

## 2017-03-13 MED ORDER — SODIUM CHLORIDE 0.9 % IV BOLUS (SEPSIS)
1000.0000 mL | Freq: Once | INTRAVENOUS | Status: AC
  Administered 2017-03-13: 1000 mL via INTRAVENOUS

## 2017-03-13 MED ORDER — KETAMINE HCL-SODIUM CHLORIDE 100-0.9 MG/10ML-% IV SOSY: 20.0000 mg | PREFILLED_SYRINGE | Freq: Once | INTRAVENOUS | Status: DC

## 2017-03-13 MED ORDER — IBUPROFEN 800 MG PO TABS: 800.0000 mg | ORAL_TABLET | Freq: Three times a day (TID) | ORAL | 0 refills | Status: DC

## 2017-03-13 MED ORDER — POLYETHYLENE GLYCOL 3350 17 G PO PACK: PACK | ORAL | 0 refills | Status: DC

## 2017-03-13 MED ORDER — HYDROMORPHONE HCL 1 MG/ML IJ SOLN
1.0000 mg | Freq: Once | INTRAMUSCULAR | Status: AC
  Administered 2017-03-13: 1 mg via INTRAVENOUS
  Filled 2017-03-13: qty 1

## 2017-03-13 MED ORDER — GADOBENATE DIMEGLUMINE 529 MG/ML IV SOLN
20.0000 mL | Freq: Once | INTRAVENOUS | Status: AC
  Administered 2017-03-13: 20 mL via INTRAVENOUS

## 2017-03-13 NOTE — ED Triage Notes (Signed)
PT states chronic lower back that is uncontrolled. He is supposed to have a shot, but is not sure when he can get into see his MD.  Also c/o constipation x 3 days.
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
## 2017-03-13 NOTE — Discharge Instructions (Signed)

## 2017-03-14 ENCOUNTER — Telehealth (INDEPENDENT_AMBULATORY_CARE_PROVIDER_SITE_OTHER): Payer: Self-pay | Admitting: General Practice

## 2017-03-14 NOTE — Telephone Encounter (Signed)
FWD to PCP. Carlie Solorzano S Calab Sachse, CMA  

## 2017-03-14 NOTE — Telephone Encounter (Signed)
[name] called because in his previous visit you discuss with him something about injections at [facility] and he wants to follow up on it

## 2017-03-15 NOTE — Telephone Encounter (Signed)
Patient is aware of that  Thank you

## 2017-03-15 NOTE — Telephone Encounter (Signed)
He is talking about pain clinic. I have already referred to pain clinic.

## 2017-03-18 ENCOUNTER — Ambulatory Visit (INDEPENDENT_AMBULATORY_CARE_PROVIDER_SITE_OTHER): Payer: Self-pay | Admitting: Infectious Disease

## 2017-03-18 ENCOUNTER — Encounter: Payer: Self-pay | Admitting: Infectious Disease

## 2017-03-18 VITALS — Temp 98.4°F | Ht 68.0 in | Wt 205.0 lb

## 2017-03-18 DIAGNOSIS — R634 Abnormal weight loss: Secondary | ICD-10-CM

## 2017-03-18 DIAGNOSIS — I330 Acute and subacute infective endocarditis: Secondary | ICD-10-CM

## 2017-03-18 DIAGNOSIS — M869 Osteomyelitis, unspecified: Secondary | ICD-10-CM

## 2017-03-18 DIAGNOSIS — M4620 Osteomyelitis of vertebra, site unspecified: Secondary | ICD-10-CM

## 2017-03-18 DIAGNOSIS — F1990 Other psychoactive substance use, unspecified, uncomplicated: Secondary | ICD-10-CM

## 2017-03-18 NOTE — Progress Notes (Signed)
HPI: [name] is a 51 y.o. 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   Allergies: Allergies  Allergen Reactions  . Penicillins     From childhood: Has patient had a PCN reaction causing immediate rash, facial/tongue/throat swelling, SOB or lightheadedness with hypotension: Unknown Has patient had a PCN reaction causing severe rash involving mucus membranes or skin necrosis: Unknown Has patient had a PCN reaction that required hospitalization: Unknown Has patient had a PCN reaction occurring within the last 10 years: No If all of the above answers are "NO", then may proceed with Cephalosporin use.     Vitals: Temp: 98.4 F (36.9 C) (10/29 1429) Temp Source: Oral (10/29 1429)  Past Medical History: Past Medical History:  Diagnosis Date  . CHF (congestive heart failure) ([place])   . Chronic bronchitis ([place])   . Chronic lower back pain   . COPD (chronic obstructive pulmonary disease) ([place])   . Diskitis   . Panic attacks   . Tobacco abuse     Social History: Social History   Social History  . Marital status: Single    Spouse name: N/A  . Number of children: N/A  . Years of education: N/A   Occupational History  . Not employed    Social History Main Topics  . Smoking status: Current Every Day Smoker    Packs/day: 0.50    Years: 35.00    Types: Cigarettes  . Smokeless tobacco: Former Systems developer    Types: Chew  . Alcohol use Yes     Comment: Daily. Heavy. ; Daily. 1-2 grams a day. ; 02/26/2017 "none in the last week"  . Drug use: Yes    Types: Cocaine     Comment: Daily. 1-2 grams a day. ; 02/26/2017 "none in the last week"  . Sexual activity: Not Asked   Other Topics Concern  . None   Social History Narrative   ** Merged History Encounter **       Currently homeless    Labs: Hepatitis B Surface Ag (no units)  Date Value  03/05/2017 Negative   HCV Ab (s/co ratio)  Date Value  03/05/2017 >11.0 (H)     CrCl: Estimated Creatinine Clearance: 113.8 mL/min (by C-G formula based on SCr of 0.84 mg/dL).  Lipids:    Component Value Date/Time   CHOL 103 10/09/2015 0211   TRIG 45 10/09/2015 0211   HDL 28 (L) 10/09/2015 0211   CHOLHDL 3.7 10/09/2015 0211   VLDL 9 10/09/2015 0211   LDLCALC 66 10/09/2015 0211    Assessment: [name] is here with his mom and brother for his complicated fungal osteo and endocarditis. When he was discharged, he was provided with 30d supply of fluconazole through the match program. He has about 15 days left of it. Of course he is uninsured, therefore, he is staying with his mom right now. Even generic fluconazole will be very expensive especially for his treatment dose. Therefore, I'm going to apply him for posaconazole patient assistance. If he does qualify, they will mail the meds to him. He will probably needed for about a year.   Recommendations:  Apply for Posaconazole 300mg  PO qday through PAP  [name], PharmD, BCPS, AAHIVP, CPP Clinical Infectious [place] [facility] 03/18/2017, 4:42 PM
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
## 2017-03-19 ENCOUNTER — Encounter: Payer: Self-pay | Admitting: Cardiology

## 2017-03-19 ENCOUNTER — Ambulatory Visit (INDEPENDENT_AMBULATORY_CARE_PROVIDER_SITE_OTHER): Payer: Self-pay | Admitting: Cardiology

## 2017-03-19 ENCOUNTER — Encounter (INDEPENDENT_AMBULATORY_CARE_PROVIDER_SITE_OTHER): Payer: Self-pay

## 2017-03-19 VITALS — BP 132/82 | HR 65 | Ht 68.0 in | Wt 201.0 lb

## 2017-03-19 DIAGNOSIS — I339 Acute and subacute endocarditis, unspecified: Secondary | ICD-10-CM

## 2017-03-19 DIAGNOSIS — F1920 Other psychoactive substance dependence, uncomplicated: Secondary | ICD-10-CM

## 2017-03-19 DIAGNOSIS — B49 Unspecified mycosis: Secondary | ICD-10-CM

## 2017-03-19 LAB — COMPLETE METABOLIC PANEL WITH GFR
AG Ratio: 0.8 (calc) — ABNORMAL LOW (ref 1.0–2.5)
ALBUMIN MSPROF: 3.9 g/dL (ref 3.6–5.1)
ALT: 71 U/L — ABNORMAL HIGH (ref 9–46)
AST: 54 U/L — AB (ref 10–35)
Alkaline phosphatase (APISO): 99 U/L (ref 40–115)
BUN: 17 mg/dL (ref 7–25)
CALCIUM: 10.1 mg/dL (ref 8.6–10.3)
CO2: 28 mmol/L (ref 20–32)
CREATININE: 1.08 mg/dL (ref 0.70–1.33)
Chloride: 97 mmol/L — ABNORMAL LOW (ref 98–110)
GFR, EST AFRICAN AMERICAN: 91 mL/min/{1.73_m2} (ref >=60)
GFR, Est Non African American: 78 mL/min/{1.73_m2} (ref >=60)
GLOBULIN: 5.1 g/dL — AB (ref 1.9–3.7)
Glucose, Bld: 103 mg/dL — ABNORMAL HIGH (ref 65–99)
Potassium: 4.7 mmol/L (ref 3.5–5.3)
SODIUM: 137 mmol/L (ref 135–146)
Total Bilirubin: 0.8 mg/dL (ref 0.2–1.2)
Total Protein: 9 g/dL — ABNORMAL HIGH (ref 6.1–8.1)

## 2017-03-19 LAB — CBC WITH DIFFERENTIAL/PLATELET
BASOS ABS: 18 {cells}/uL (ref 0–200)
Basophils Relative: 0.3 %
Eosinophils Absolute: 232 cells/uL (ref 15–500)
Eosinophils Relative: 3.8 %
HEMATOCRIT: 43 % (ref 38.5–50.0)
Hemoglobin: 15.3 g/dL (ref 13.2–17.1)
LYMPHS ABS: 1623 {cells}/uL (ref 850–3900)
MCH: 34.5 pg — AB (ref 27.0–33.0)
MCHC: 35.6 g/dL (ref 32.0–36.0)
MCV: 97.1 fL (ref 80.0–100.0)
MPV: 10.2 fL (ref 7.5–12.5)
Monocytes Relative: 8.1 %
NEUTROS PCT: 61.2 %
Neutro Abs: 3733 cells/uL (ref 1500–7800)
Platelets: 276 10*3/uL (ref 140–400)
RBC: 4.43 10*6/uL (ref 4.20–5.80)
RDW: 12 % (ref 11.0–15.0)
Total Lymphocyte: 26.6 %
WBC: 6.1 10*3/uL (ref 3.8–10.8)
WBCMIX: 494 {cells}/uL (ref 200–950)

## 2017-03-19 LAB — SEDIMENTATION RATE: Sed Rate: 82 mm/h — ABNORMAL HIGH (ref 0–20)

## 2017-03-19 LAB — C-REACTIVE PROTEIN: CRP: 19.3 mg/L — AB (ref <8.0)

## 2017-03-19 NOTE — Patient Instructions (Addendum)
Medication Instructions:  Your physician recommends that you continue on your current medications as directed. Please refer to the Current Medication list given to you today.   Labwork: none  Testing/Procedures: Your physician has requested that you have an echocardiogram. Echocardiography is a painless test that uses sound waves to create images of your heart. It provides your doctor with information about the size and shape of your heart and how well your heart's chambers and valves are working. This procedure takes approximately one hour. There are no restrictions for this procedure.   Follow-Up: Your physician recommends that you schedule a follow-up appointment in: 3 months with Dr.Nelson  You have been referred to [facility]   Any Other Special Instructions Will Be Listed Below (If Applicable).     If you need a refill on your cardiac medications before your next appointment, please call your pharmacy.
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
## 2017-03-22 ENCOUNTER — Other Ambulatory Visit (HOSPITAL_COMMUNITY): Payer: Self-pay

## 2017-03-27 LAB — FUNGUS CULTURE WITH STAIN

## 2017-03-27 LAB — FUNGAL ORGANISM REFLEX

## 2017-03-27 LAB — FUNGUS CULTURE RESULT

## 2017-03-29 ENCOUNTER — Other Ambulatory Visit (HOSPITAL_COMMUNITY): Payer: Self-pay

## 2017-04-01 ENCOUNTER — Telehealth: Payer: Self-pay | Admitting: *Deleted

## 2017-04-01 ENCOUNTER — Telehealth: Payer: Self-pay | Admitting: Pharmacist Clinician (PhC)/ Clinical Pharmacy Specialist

## 2017-04-01 MED ORDER — POSACONAZOLE 100 MG PO TBEC: 300.0000 mg | DELAYED_RELEASE_TABLET | Freq: Every day | ORAL | 11 refills | Status: DC

## 2017-04-01 NOTE — Telephone Encounter (Signed)
He knows already we DO NOT  Prescribe medications for pain. I dont have a clinic opening for him any time soon. I doubt my partners do either. I tried to get him seen by [facility] for suboxone which would have helped him with pain but he refused. I am a bit worried that this could represent worsening of his back infection. When is he scheduled to see [place]?

## 2017-04-01 NOTE — Telephone Encounter (Signed)
That is fine thanks Kennyth Lose!

## 2017-04-01 NOTE — Telephone Encounter (Signed)
Thanks Minh. He needs to stop calling us for pain meds. At least he appears to be taking his azole.

## 2017-04-01 NOTE — Telephone Encounter (Signed)
[name] called to ask if he can stop the fluconazole and start posaconazole. He has a little left of the flu so told to finish that out first. He ask for pain meds so I said that he must establish care with [facility] first. He was given this info while in hospital but never called them. Gave him the info again.

## 2017-04-01 NOTE — Telephone Encounter (Signed)
Patient called c/o extreme pain in his back and requesting an Rx for pain. He stated he is losing weight in his legs and is having difficulty walking due to the pain. Please advise

## 2017-04-01 NOTE — Telephone Encounter (Signed)
He did not have an appt; I scheduled him for 04/22/17, which is your next available.

## 2017-04-02 ENCOUNTER — Other Ambulatory Visit (HOSPITAL_COMMUNITY): Payer: Self-pay

## 2017-04-02 LAB — ACID FAST CULTURE WITH REFLEXED SENSITIVITIES

## 2017-04-02 LAB — ACID FAST CULTURE WITH REFLEXED SENSITIVITIES (MYCOBACTERIA): Acid Fast Culture: NEGATIVE

## 2017-04-08 ENCOUNTER — Ambulatory Visit (INDEPENDENT_AMBULATORY_CARE_PROVIDER_SITE_OTHER): Payer: Self-pay | Admitting: Physician Assistant

## 2017-04-16 ENCOUNTER — Other Ambulatory Visit: Payer: Self-pay

## 2017-04-16 ENCOUNTER — Ambulatory Visit (INDEPENDENT_AMBULATORY_CARE_PROVIDER_SITE_OTHER): Payer: Self-pay | Admitting: Physician Assistant

## 2017-04-16 ENCOUNTER — Encounter (HOSPITAL_COMMUNITY): Payer: Self-pay | Admitting: Emergency Medicine

## 2017-04-16 ENCOUNTER — Encounter (INDEPENDENT_AMBULATORY_CARE_PROVIDER_SITE_OTHER): Payer: Self-pay | Admitting: Physician Assistant

## 2017-04-16 ENCOUNTER — Emergency Department (HOSPITAL_COMMUNITY)
Admission: EM | Admit: 2017-04-16 | Discharge: 2017-04-16 | Disposition: A | Discharge status: Home / Self Care | Payer: Self-pay | Attending: Emergency Medicine | Admitting: Emergency Medicine

## 2017-04-16 VITALS — BP 228/152 | HR 82 | Temp 98.2°F | Wt 194.2 lb

## 2017-04-16 DIAGNOSIS — M869 Osteomyelitis, unspecified: Secondary | ICD-10-CM

## 2017-04-16 DIAGNOSIS — R030 Elevated blood-pressure reading, without diagnosis of hypertension: Secondary | ICD-10-CM | POA: Insufficient documentation

## 2017-04-16 DIAGNOSIS — Z5321 Procedure and treatment not carried out due to patient leaving prior to being seen by health care provider: Secondary | ICD-10-CM | POA: Insufficient documentation

## 2017-04-16 DIAGNOSIS — M549 Dorsalgia, unspecified: Secondary | ICD-10-CM | POA: Insufficient documentation

## 2017-04-16 DIAGNOSIS — I10 Essential (primary) hypertension: Secondary | ICD-10-CM

## 2017-04-16 MED ORDER — LISINOPRIL 20 MG PO TABS
40.0000 mg | ORAL_TABLET | Freq: Once | ORAL | Status: AC
  Administered 2017-04-16: 40 mg via ORAL

## 2017-04-16 MED ORDER — OXYCODONE-ACETAMINOPHEN 5-325 MG PO TABS: 1.0000 | ORAL_TABLET | Freq: Two times a day (BID) | ORAL | 0 refills | Status: DC | PRN

## 2017-04-16 NOTE — Progress Notes (Signed)
Subjective:  Patient ID: [name], male    DOB: [birth year]  Age: 51 y.o. MRN: [id number]  CC: f/u back pain   HPI  [name] is a 51 y.o. male with a medical history of CHF, chronic bronchitis, chronic lower back pain, COPD, discitis, panic attacks, heroin use, and tobacco abuse presents to f/u on pain 2/2 fungal osteomyelitis. He was referred to pain clinic at [facility] but was declined because they don't see patients with osteomyelitis induced pain. He has kept f/u with infectious disease appointments and has been switched from fluconazole to Posaconazole due to affordability with the [employer] program. Pain due to osteomyelitis is greatest at the lower back. Pain 8/10 when walking, standing, or sitting. Pain relieved with laying supine.     BP is noted to be highly elevated in clinic today. Seen one month ago and BP was 120/78 on Lisinopril 40 mg and HCTZ 12.5 mg. He has run out of anti-hypertensives. Reports a left sided headache yesterday. Does not endorse current headache, CP, palpitations, SOB, abdominal pain, tingling, numbness, swelling, presyncope, syncope, visual blurring, AMS, or speech impairment.    Outpatient Medications Prior to Visit  Medication Sig Dispense Refill  . posaconazole (NOXAFIL) 100 MG TBEC delayed-release tablet Take 3 tablets (300 mg total) daily by mouth. 42 tablet 11  . ibuprofen (ADVIL,MOTRIN) 800 MG tablet Take 1 tablet (800 mg total) by mouth 3 (three) times daily. (Patient not taking: Reported on 04/16/2017) 21 tablet 0  . lisinopril (PRINIVIL,ZESTRIL) 40 MG tablet Take 1 tablet (40 mg total) by mouth daily. (Patient not taking: Reported on 04/16/2017) 90 tablet 3   No facility-administered medications prior to visit.      ROS Review of Systems  Constitutional: Negative for chills, fever and malaise/fatigue.  Eyes: Negative for blurred vision.  Respiratory: Negative for shortness of breath.   Cardiovascular: Negative for chest  pain and palpitations.  Gastrointestinal: Negative for abdominal pain and nausea.  Genitourinary: Negative for dysuria and hematuria.  Musculoskeletal: Positive for back pain. Negative for joint pain and myalgias.  Skin: Negative for rash.  Neurological: Negative for tingling and headaches.  Psychiatric/Behavioral: Negative for depression. The patient is not nervous/anxious.     Objective:  Blood pressure (!) 228/152, pulse 82, temperature 98.2 F (36.8 C), temperature source Oral, weight 194 lb 3.2 oz (88.1 kg), SpO2 98 %.    Physical Exam  Constitutional: He is oriented to person, place, and time.  Well developed, well nourished, laying supine on exam table, NAD while laying supine, in apparent discomfort when standing or sitting, polite  HENT:  Head: Normocephalic and atraumatic.  Eyes: Conjunctivae are normal. No scleral icterus.  Cardiovascular: Normal rate, regular rhythm and normal heart sounds.  No murmur heard. Pulmonary/Chest: Effort normal and breath sounds normal. No respiratory distress. He has no wheezes.  Abdominal: Soft. Bowel sounds are normal. There is no tenderness.  Musculoskeletal: He exhibits no edema.  Neurological: He is alert and oriented to person, place, and time. No cranial nerve deficit. Coordination normal.  Skin: Skin is warm and dry. No rash noted. No erythema. No pallor.  Psychiatric: He has a normal mood and affect. His behavior is normal. Thought content normal.  Vitals reviewed.    Assessment & Plan:    1. Malignant hypertension  - Patient has been strongly advised to go to ED for gradual BP reduction. Mother accompanies patient and reassures me she will take him to ED. I will see him  when he is discharged from the hospital.   2. 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[place]) - Continue treatment with Posaconazole.  - Keep ID appointments. - Begin oxyCODONE-acetaminophen (ROXICET) 5-325 MG tablet; Take 1 tablet by mouth every 12 (twelve) hours as needed for  severe pain.  Dispense: 60 tablet; Refill: 0 - Patient has been prescribed narcotic pain medication as he has been denied by [facility] because they don't treat people with osteomyelitis. He can not afford to pay any amount to go to other pain clinics.   Meds ordered this encounter  Medications  . oxyCODONE-acetaminophen (ROXICET) 5-325 MG tablet    Sig: Take 1 tablet by mouth every 12 (twelve) hours as needed for severe pain.    Dispense:  60 tablet    Refill:  0    Order Specific Question:   Supervising Provider    Answer:   Tresa Garter [[id number]]    Follow-up: Return for After ED discharge.   Clent Demark PA

## 2017-04-16 NOTE — ED Notes (Signed)
Pt states that he is leaving due to wait times, unable to get pt to stay

## 2017-04-16 NOTE — Patient Instructions (Signed)
You are strongly advised to go to the Emergency Room to reduce your blood pressure gradually. You are running the risk of stroke or heart attack with highly elevated blood pressure.    Hypertension Hypertension, commonly called high blood pressure, is when the force of blood pumping through the arteries is too strong. The arteries are the blood vessels that carry blood from the heart throughout the body. Hypertension forces the heart to work harder to pump blood and may cause arteries to become narrow or stiff. Having untreated or uncontrolled hypertension can cause heart attacks, strokes, kidney disease, and other problems. A blood pressure reading consists of a higher number over a lower number. Ideally, your blood pressure should be below 120/80. The first ("top") number is called the systolic pressure. It is a measure of the pressure in your arteries as your heart beats. The second ("bottom") number is called the diastolic pressure. It is a measure of the pressure in your arteries as the heart relaxes. What are the causes? The cause of this condition is not known. What increases the risk? Some risk factors for high blood pressure are under your control. Others are not. Factors you can change  Smoking.  Having type 2 diabetes mellitus, high cholesterol, or both.  Not getting enough exercise or physical activity.  Being overweight.  Having too much fat, sugar, calories, or salt (sodium) in your diet.  Drinking too much alcohol. Factors that are difficult or impossible to change  Having chronic kidney disease.  Having a family history of high blood pressure.  Age. Risk increases with age.  Race. You may be at higher risk if you are African-American.  Gender. Men are at higher risk than women before age 65. After age 43, women are at higher risk than men.  Having obstructive sleep apnea.  Stress. What are the signs or symptoms? Extremely high blood pressure (hypertensive  crisis) may cause:  Headache.  Anxiety.  Shortness of breath.  Nosebleed.  Nausea and vomiting.  Severe chest pain.  Jerky movements you cannot control (seizures).  How is this diagnosed? This condition is diagnosed by measuring your blood pressure while you are seated, with your arm resting on a surface. The cuff of the blood pressure monitor will be placed directly against the skin of your upper arm at the level of your heart. It should be measured at least twice using the same arm. Certain conditions can cause a difference in blood pressure between your right and left arms. Certain factors can cause blood pressure readings to be lower or higher than normal (elevated) for a short period of time:  When your blood pressure is higher when you are in a health care provider's office than when you are at home, this is called white coat hypertension. Most people with this condition do not need medicines.  When your blood pressure is higher at home than when you are in a health care provider's office, this is called masked hypertension. Most people with this condition may need medicines to control blood pressure.  If you have a high blood pressure reading during one visit or you have normal blood pressure with other risk factors:  You may be asked to return on a different day to have your blood pressure checked again.  You may be asked to monitor your blood pressure at home for 1 week or longer.  If you are diagnosed with hypertension, you may have other blood or imaging tests to help your health care  provider understand your overall risk for other conditions. How is this treated? This condition is treated by making healthy lifestyle changes, such as eating healthy foods, exercising more, and reducing your alcohol intake. Your health care provider may prescribe medicine if lifestyle changes are not enough to get your blood pressure under control, and if:  Your systolic blood pressure is  above 130.  Your diastolic blood pressure is above 80.  Your personal target blood pressure may vary depending on your medical conditions, your age, and other factors. Follow these instructions at home: Eating and drinking  Eat a diet that is high in fiber and potassium, and low in sodium, added sugar, and fat. An example eating plan is called the DASH (Dietary Approaches to Stop Hypertension) diet. To eat this way: ? Eat plenty of fresh fruits and vegetables. Try to fill half of your plate at each meal with fruits and vegetables. ? Eat whole grains, such as whole wheat pasta, brown rice, or whole grain bread. Fill about one quarter of your plate with whole grains. ? Eat or drink low-fat dairy products, such as skim milk or low-fat yogurt. ? Avoid fatty cuts of meat, processed or cured meats, and poultry with skin. Fill about one quarter of your plate with lean proteins, such as fish, chicken without skin, beans, eggs, and tofu. ? Avoid premade and processed foods. These tend to be higher in sodium, added sugar, and fat.  Reduce your daily sodium intake. Most people with hypertension should eat less than 1,500 mg of sodium a day.  Limit alcohol intake to no more than 1 drink a day for nonpregnant women and 2 drinks a day for men. One drink equals 12 oz of beer, 5 oz of wine, or 1 oz of hard liquor. Lifestyle  Work with your health care provider to maintain a healthy body weight or to lose weight. Ask what an ideal weight is for you.  Get at least 30 minutes of exercise that causes your heart to beat faster (aerobic exercise) most days of the week. Activities may include walking, swimming, or biking.  Include exercise to strengthen your muscles (resistance exercise), such as pilates or lifting weights, as part of your weekly exercise routine. Try to do these types of exercises for 30 minutes at least 3 days a week.  Do not use any products that contain nicotine or tobacco, such as  cigarettes and e-cigarettes. If you need help quitting, ask your health care provider.  Monitor your blood pressure at home as told by your health care provider.  Keep all follow-up visits as told by your health care provider. This is important. Medicines  Take over-the-counter and prescription medicines only as told by your health care provider. Follow directions carefully. Blood pressure medicines must be taken as prescribed.  Do not skip doses of blood pressure medicine. Doing this puts you at risk for problems and can make the medicine less effective.  Ask your health care provider about side effects or reactions to medicines that you should watch for. Contact a health care provider if:  You think you are having a reaction to a medicine you are taking.  You have headaches that keep coming back (recurring).  You feel dizzy.  You have swelling in your ankles.  You have trouble with your vision. Get help right away if:  You develop a severe headache or confusion.  You have unusual weakness or numbness.  You feel faint.  You have severe pain in  your chest or abdomen.  You vomit repeatedly.  You have trouble breathing. Summary  Hypertension is when the force of blood pumping through your arteries is too strong. If this condition is not controlled, it may put you at risk for serious complications.  Your personal target blood pressure may vary depending on your medical conditions, your age, and other factors. For most people, a normal blood pressure is less than 120/80.  Hypertension is treated with lifestyle changes, medicines, or a combination of both. Lifestyle changes include weight loss, eating a healthy, low-sodium diet, exercising more, and limiting alcohol. This information is not intended to replace advice given to you by your health care provider. Make sure you discuss any questions you have with your health care provider. Document Released: 05/07/2005 Document  Revised: 04/04/2016 Document Reviewed: 04/04/2016 Elsevier Interactive Patient Education  [employer].

## 2017-04-16 NOTE — ED Triage Notes (Signed)
Pt arrived POV sent from his PCP due to HTN. Patient states that he hasn't had his BP meds since sunday. And has chronic pain from a infection in his back that he hasn't filled his prescription for pain medicine.

## 2017-04-18 ENCOUNTER — Telehealth (INDEPENDENT_AMBULATORY_CARE_PROVIDER_SITE_OTHER): Payer: Self-pay | Admitting: Physician Assistant

## 2017-04-18 ENCOUNTER — Encounter (HOSPITAL_COMMUNITY): Payer: Self-pay | Admitting: Radiology

## 2017-04-18 NOTE — Telephone Encounter (Signed)
Patient wanted PA to know that he went to ED and was given 2 BP pills and placed him back in the lobby, waited for 5 hours had not been seen yet by a provider, so patient left due to wait and him being in pain. Patient did go and pick up Bp medication from [place] and has began taking it. Patient states that he does not like taking pills, but he understands that he needs them, states he is taking as directed but does not feel as though they are helping him. States he will take them as he needs them but will not abuse them. During call patient also focused a lot on his pain and believes that if he relieves his pain his blood pressure will return to normal. Patient says Thank You for helping him and he appreciates all that our clinic does for him. Patient has been checking his BP at home and the average is about 160/110, no headaches or blurred vision.

## 2017-04-18 NOTE — Telephone Encounter (Signed)
Patient went to the ED after his visit here htn and wants to talk to [place]. Please, call him back [phone] . Thank You

## 2017-04-19 NOTE — Telephone Encounter (Signed)
Noted  

## 2017-04-22 ENCOUNTER — Ambulatory Visit: Payer: Self-pay | Admitting: Infectious Disease

## 2017-04-22 ENCOUNTER — Telehealth: Payer: Self-pay | Admitting: Cardiology

## 2017-04-22 ENCOUNTER — Telehealth (INDEPENDENT_AMBULATORY_CARE_PROVIDER_SITE_OTHER): Payer: Self-pay | Admitting: Physician Assistant

## 2017-04-22 NOTE — Telephone Encounter (Signed)
Spoke with pt who reports his BP has been elevated for awhile now.  He reports it has been running around 170-180/110-120.  He denies missing any medication doses initially but then states he did run out of medication and didn't take it for 2 days.  He is taking his BP with a battery operated arm cuff at home.  The batteries are "pretty fresh but cheap".  He c/o being in "so much pain" and believes his BP would be better if he wasn't in so much pain.  On repeat:  BP while on the phone pt rechecked his BP and it was 156/91.  Advised to continue to monitor it (and keep a diary), take medications as ordered and to call PCP if continued to be elevated since Mr Domenica Fail, PA has been treating his HTN.  Pt does report taking Lisinopril 40 mg a day.   He requests his 2 D Echo be rescheduled since he has had to cancel it in the past d/t no feeling well and being in pain.  appt scheduled at his request.  He has an appt scheduled to f/u with Dr Meda Coffee in 2/19.

## 2017-04-22 NOTE — Telephone Encounter (Signed)
Pt c/o BP issue: STAT if pt c/o blurred vision, one-sided weakness or slurred speech  1. What are your last 5 BP readings?  178/120  2. Are you having any other symptoms (ex. Dizziness, headache, blurred vision, passed out)?  No   3. What is your BP issue?  High bp  Pain in back from spinal injury

## 2017-04-22 NOTE — Telephone Encounter (Signed)
Spoke with patient, informed that our office had not left any messages. Nat Christen, CMA

## 2017-04-22 NOTE — Telephone Encounter (Signed)
Patient [name] saying that he received a call but he didn't understand the message thank you

## 2017-04-23 ENCOUNTER — Telehealth (INDEPENDENT_AMBULATORY_CARE_PROVIDER_SITE_OTHER): Payer: Self-pay | Admitting: Physician Assistant

## 2017-04-23 ENCOUNTER — Other Ambulatory Visit (INDEPENDENT_AMBULATORY_CARE_PROVIDER_SITE_OTHER): Payer: Self-pay | Admitting: Physician Assistant

## 2017-04-23 MED ORDER — AMLODIPINE BESYLATE 10 MG PO TABS
10.0000 mg | ORAL_TABLET | Freq: Every day | ORAL | 1 refills | Status: DC
Start: 2017-04-23 — End: 2017-06-10

## 2017-04-23 MED ORDER — LISINOPRIL-HYDROCHLOROTHIAZIDE 20-12.5 MG PO TABS
2.0000 | ORAL_TABLET | Freq: Every day | ORAL | 1 refills | Status: DC
Start: 2017-04-23 — End: 2017-06-10

## 2017-04-23 MED FILL — LISINOPRIL-HCTZ 20-12.5 MG: 20-12.5 | 30 days supply | Qty: 60 | Fill #0

## 2017-04-23 MED FILL — AMLODIPINE BESYLATE 10 MG T: 10 | 30 days supply | Qty: 30 | Fill #0

## 2017-04-23 NOTE — Telephone Encounter (Signed)
Patient called to letP

## 2017-05-03 ENCOUNTER — Ambulatory Visit (HOSPITAL_COMMUNITY): Payer: Self-pay | Attending: Internal Medicine

## 2017-05-03 ENCOUNTER — Telehealth (INDEPENDENT_AMBULATORY_CARE_PROVIDER_SITE_OTHER): Payer: Self-pay | Admitting: Physician Assistant

## 2017-05-03 NOTE — Telephone Encounter (Signed)
Patient would like to talk to [employer]  Thank you

## 2017-05-06 NOTE — Telephone Encounter (Signed)
Spoke with patient and he wants a refill for pain medication. Explained to patient that he will need to speak with provider at appointment on 12/19 to see if refill will be obliged. Nat Christen, CMA

## 2017-05-08 ENCOUNTER — Encounter (INDEPENDENT_AMBULATORY_CARE_PROVIDER_SITE_OTHER): Payer: Self-pay | Admitting: Physician Assistant

## 2017-05-08 ENCOUNTER — Ambulatory Visit (INDEPENDENT_AMBULATORY_CARE_PROVIDER_SITE_OTHER): Payer: Self-pay | Admitting: Physician Assistant

## 2017-05-08 ENCOUNTER — Other Ambulatory Visit: Payer: Self-pay

## 2017-05-08 VITALS — BP 158/96 | HR 79 | Temp 97.7°F | Wt 198.0 lb

## 2017-05-08 DIAGNOSIS — Z79899 Other long term (current) drug therapy: Secondary | ICD-10-CM

## 2017-05-08 DIAGNOSIS — I10 Essential (primary) hypertension: Secondary | ICD-10-CM

## 2017-05-08 DIAGNOSIS — M869 Osteomyelitis, unspecified: Secondary | ICD-10-CM

## 2017-05-08 MED ORDER — OXYCODONE-ACETAMINOPHEN 10-325 MG PO TABS: 1.0000 | ORAL_TABLET | Freq: Two times a day (BID) | ORAL | 0 refills | Status: DC | PRN

## 2017-05-08 MED ORDER — CARVEDILOL 6.25 MG PO TABS: 6.2500 mg | ORAL_TABLET | Freq: Two times a day (BID) | ORAL | 2 refills | Status: DC

## 2017-05-08 NOTE — Patient Instructions (Signed)
Bone and Joint Infections, Adult Bone infections (osteomyelitis) and joint infections (septic arthritis) occur when bacteria or other germs get inside a bone or a joint. This can happen if you have an infection in another part of your body that spreads through your blood. Germs from your skin or from outside of your body can also cause this type of infection if you have a wound or a broken bone (fracture) that breaks the skin. Anyone can get a bone infection or joint infection. You may be more likely to get this type of infection if you have a condition, such as diabetes, that lowers your ability to fight infection or increases your chances of getting an infection. Bone and joint infections can cause damage, and they can spread to other areas of your body. They need to be treated quickly. What are the causes? Most bone and joint infections are caused by bacteria. They can also be caused by other germs, such as viruses and funguses. What increases the risk? This condition is more likely to develop in:  People who recently had surgery, especially bone or joint surgery.  People who have a long-term (chronic) disease, such as: ? HIV (human immunodeficiency virus). ? Diabetes. ? Rheumatoid arthritis. ? Sickle cell anemia.  Elderly people.  People who take medicines that block or weaken the body's defense system (immune system).  People who have a condition that reduces their blood flow.  People who are on kidney dialysis.  People who have an artificial joint.  People who have had a joint or bone repaired with plates or screws (surgical hardware).  People who use or abuse IV drugs.  People who have had trauma, such as stepping on a nail.  What are the signs or symptoms? Symptoms vary depending on the type and location of your infection. Common symptoms of bone and joint infections include:  Fever and chills.  Redness and warmth.  Swelling.  Pain and stiffness.  Drainage of fluid  or pus near the infection.  Weight loss and fatigue.  Decreased ability to use a hand or foot.  How is this diagnosed? This condition may be diagnosed based on symptoms, medical history, a physical exam, and diagnostic tests. Tests can help to identify the cause of the infection. You may have various tests, such as:  A sample of tissue, fluid, or blood taken to be examined under a microscope.  A procedure to remove fluid from the infected joint with a needle (joint aspiration) for testing in a lab.  Pus or discharge swabbed from a wound for testing to identify germs and to determine what type of medicine will kill them (culture and sensitivity).  Blood tests to look for evidence of infection and inflammation (biomarkers).  Imaging studies to determine how severe the bone or joint infection is. These may include: ? X-rays. ? CT scan. ? MRI. ? Bone scan.  How is this treated? Treatment depends on the cause and type of infection. Antibiotic medicines are usually the first treatment for a bone or joint infection. Treatment with antibiotics may include:  Getting IV antibiotics. This may be done in a hospital at first. You may have to continue IV antibiotics at home for several weeks. You may also have to take antibiotics by mouth for several weeks after that.  Taking more than one kind of antibiotic. Treatment may start with a type of antibiotic that works against many different bacteria (broad spectrumantibiotics). IV antibiotics may be changed if tests show that another   type may work better.  Other treatments may include:  Draining fluid from the joint by placing a needle into it (aspiration).  Surgery to remove: ? Dead or dying tissue from a bone or joint. ? An infected artificial joint. ? Infected plates or screws that were used to repair a broken bone.  Follow these instructions at home:  Take medicines only as directed by your health care provider.  Take your antibiotic  medicine as directed by your health care provider. Finish the antibiotic even if you start to feel better.  Follow instructions from your health care provider about how to take IV antibiotics at home.  Ask your health care provider if you have any restrictions on your activities.  Keep all follow-up visits as directed by your health care provider. This is important. Contact a health care provider if:  You have a fever or chills.  You have redness, warmth, pain, or swelling that returns after treatment. Get help right away if:  You have rapid breathing or you have trouble breathing.  You have chest pain.  You cannot drink fluids or make urine.  The affected arm or leg swells, changes color, or turns blue. This information is not intended to replace advice given to you by your health care provider. Make sure you discuss any questions you have with your health care provider. Document Released: 05/07/2005 Document Revised: 10/13/2015 Document Reviewed: 05/05/2014 Elsevier Interactive Patient Education  2018 Elsevier Inc.  

## 2017-05-08 NOTE — Progress Notes (Signed)
Subjective:  Patient ID: [name], male    DOB: [birth year]  Age: 51 y.o. 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 He was referred to pain clinic at [facility] but was declined because they don't see patients with osteomyelitis induced pain. He has kept f/u with infectious disease appointments and has been switched from fluconazole to Posaconazole due to affordability with the [facility] program. Pain due to osteomyelitis is greatest at the lower back. Pain 8/10 when walking, standing, or sitting. Pain relieved with laying supine. Had a couple of days that he was almost pain free but pain returned and can be severe. Has missed an appointment with ID because his pain was too intense. Next appointment with ID on 05/28/17     He was sent to the ED on 04/16/17 due to BP 228/152 mm/Hg. He went to the ED, was given "two pills" and left because of the prolonged wait time. Went to the pharmacy to pick up his anti-hypertensives after the ED visit and has noted BP to be averaging 160/110. Does not endorse CP, palpitations, SOB, HA, abdominal pain, f/c/n/v, rash, or GI/GU sxs.    Outpatient Medications Prior to Visit  Medication Sig Dispense Refill  . amLODipine (NORVASC) 10 MG tablet Take 1 tablet (10 mg total) by mouth daily. 90 tablet 1  . ibuprofen (ADVIL,MOTRIN) 800 MG tablet Take 1 tablet (800 mg total) by mouth 3 (three) times daily. (Patient not taking: Reported on 04/16/2017) 21 tablet 0  . lisinopril-hydrochlorothiazide (ZESTORETIC) 20-12.5 MG tablet Take 2 tablets by mouth daily. 180 tablet 1  . oxyCODONE-acetaminophen (ROXICET) 5-325 MG tablet Take 1 tablet by mouth every 12 (twelve) hours as needed for severe pain. 60 tablet 0  . posaconazole (NOXAFIL) 100 MG TBEC  delayed-release tablet Take 3 tablets (300 mg total) daily by mouth. 42 tablet 11   No facility-administered medications prior to visit.      ROS Review of Systems  Constitutional: Negative for chills, fever and malaise/fatigue.  Eyes: Negative for blurred vision.  Respiratory: Negative for shortness of breath.   Cardiovascular: Negative for chest pain and palpitations.  Gastrointestinal: Negative for abdominal pain and nausea.  Genitourinary: Negative for dysuria and hematuria.  Musculoskeletal: Positive for back pain. Negative for joint pain and myalgias.  Skin: Negative for rash.  Neurological: Negative for tingling and headaches.  Psychiatric/Behavioral: Negative for depression. The patient is not nervous/anxious.     Objective:   Vitals:   05/08/17 1336  BP: (!) 158/96  Pulse: 79  Temp: 97.7 F (36.5 C)  SpO2: 98%     Physical Exam  Constitutional: He is oriented to person, place, and time.  Well developed, well nourished, NAD, polite, laying supine on exam bed.  HENT:  Head: Normocephalic and atraumatic.  Eyes: Conjunctivae are normal. No scleral icterus.  Neck: Normal range of motion. Neck supple. No thyromegaly present.  Cardiovascular: Normal rate, regular rhythm and normal heart sounds.  Pulmonary/Chest: Effort normal and breath sounds normal.  Musculoskeletal: He exhibits no edema.  Back without edema, ecchymosis, or erythema. Full flexion and extension of back but pain elicited throughout the full range. Patient moving with more fluency than at previous visit.   Neurological: He is alert and oriented to person, place, and time.  No cranial nerve deficit. Coordination normal.  Skin: Skin is warm and dry. No rash noted. No erythema. No pallor.  Psychiatric: He has a normal mood and affect. His behavior is normal. Thought content normal.  Vitals reviewed.    Assessment & Plan:   1. 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  Dispense: 40 tablet; Refill: 0 - I have referred patient to [facility] pain center but he was rejected as they do not treat osteomyelitis patients. I have told him that he will need to have pain managed elsewhere but he states that he lacks the funds to pay for pain management. I have decided to treat him on tapering doses of narcotics over time as his osteomyelitis heals.   2. Hypertension, unspecified type - Comprehensive metabolic panel - CBC with Differential - Begin carvedilol (COREG) 6.25 MG tablet; Take 1 tablet (6.25 mg total) by mouth 2 (two) times daily with a meal.  Dispense: 30 tablet; Refill: 2  3. High risk medication use - CMP and CBC   Meds ordered this encounter  Medications  . oxyCODONE-acetaminophen (PERCOCET) 10-325 MG tablet    Sig: Take 1 tablet by mouth every 12 (twelve) hours as needed for pain.    Dispense:  40 tablet    Refill:  0    Order Specific Question:   Supervising Provider    Answer:   Tresa Garter [id number]  . carvedilol (COREG) 6.25 MG tablet    Sig: Take 1 tablet (6.25 mg total) by mouth 2 (two) times daily with a meal.    Dispense:  30 tablet    Refill:  2    Order Specific Question:   Supervising Provider    Answer:   Tresa Garter [id number]    Follow-up: Return in about 3 weeks (around 05/29/2017).   Clent Demark PA

## 2017-05-09 ENCOUNTER — Telehealth: Payer: Self-pay | Admitting: *Deleted

## 2017-05-09 ENCOUNTER — Encounter (HOSPITAL_COMMUNITY): Payer: Self-pay | Admitting: Radiology

## 2017-05-09 LAB — CBC WITH DIFFERENTIAL/PLATELET
BASOS ABS: 0 10*3/uL (ref 0.0–0.2)
BASOS: 0 %
EOS (ABSOLUTE): 0.2 10*3/uL (ref 0.0–0.4)
Eos: 3 %
Hematocrit: 41.7 % (ref 37.5–51.0)
Hemoglobin: 15.1 g/dL (ref 13.0–17.7)
IMMATURE GRANULOCYTES: 1 %
Immature Grans (Abs): 0 10*3/uL (ref 0.0–0.1)
Lymphocytes Absolute: 2.2 10*3/uL (ref 0.7–3.1)
Lymphs: 38 %
MCH: 33.9 pg — AB (ref 26.6–33.0)
MCHC: 36.2 g/dL — ABNORMAL HIGH (ref 31.5–35.7)
MCV: 94 fL (ref 79–97)
MONOS ABS: 0.6 10*3/uL (ref 0.1–0.9)
Monocytes: 10 %
NEUTROS PCT: 48 %
Neutrophils Absolute: 3 10*3/uL (ref 1.4–7.0)
PLATELETS: 273 10*3/uL (ref 150–379)
RBC: 4.46 x10E6/uL (ref 4.14–5.80)
RDW: 15.4 % (ref 12.3–15.4)
WBC: 6 10*3/uL (ref 3.4–10.8)

## 2017-05-09 LAB — COMPREHENSIVE METABOLIC PANEL
A/G RATIO: 1 — AB (ref 1.2–2.2)
ALK PHOS: 105 IU/L (ref 39–117)
ALT: 119 IU/L — AB (ref 0–44)
AST: 66 IU/L — AB (ref 0–40)
Albumin: 4.2 g/dL (ref 3.5–5.5)
BUN/Creatinine Ratio: 20 (ref 9–20)
BUN: 15 mg/dL (ref 6–24)
Bilirubin Total: 0.4 mg/dL (ref 0.0–1.2)
CALCIUM: 9.5 mg/dL (ref 8.7–10.2)
CHLORIDE: 98 mmol/L (ref 96–106)
CO2: 23 mmol/L (ref 20–29)
Creatinine, Ser: 0.74 mg/dL — ABNORMAL LOW (ref 0.76–1.27)
GFR calc Af Amer: 123 mL/min/{1.73_m2} (ref >59)
GFR, EST NON AFRICAN AMERICAN: 106 mL/min/{1.73_m2} (ref >59)
GLOBULIN, TOTAL: 4.2 g/dL (ref 1.5–4.5)
Glucose: 129 mg/dL — ABNORMAL HIGH (ref 65–99)
Potassium: 3.9 mmol/L (ref 3.5–5.2)
SODIUM: 136 mmol/L (ref 134–144)
Total Protein: 8.4 g/dL (ref 6.0–8.5)

## 2017-05-09 NOTE — Telephone Encounter (Signed)
-----   Message from Lenard Galloway V sent at 05/09/2017 11:57 AM EST ----- Regarding: echo Just an FYI. We have made several attempts to contact this patient including sending a letter to schedule or reschedule their echocardiogram. We will be removing the patient from the echo WQ.   Thank you

## 2017-05-10 ENCOUNTER — Telehealth (INDEPENDENT_AMBULATORY_CARE_PROVIDER_SITE_OTHER): Payer: Self-pay

## 2017-05-10 NOTE — Telephone Encounter (Signed)
-----   Message from Clent Demark, PA-C sent at 05/10/2017  9:04 AM EST ----- Liver enzymes are slowly elevating. This is likely due to the fungal medicine he is taking. Please have patient call his infectious disease doctor and ask about his treatment. I will also message his ID doctor. All other labs normal.

## 2017-05-10 NOTE — Telephone Encounter (Signed)
Left a voicemail asking patient to call the office.Nat Christen, CMA

## 2017-05-17 ENCOUNTER — Encounter (INDEPENDENT_AMBULATORY_CARE_PROVIDER_SITE_OTHER): Payer: Self-pay

## 2017-05-17 ENCOUNTER — Telehealth (INDEPENDENT_AMBULATORY_CARE_PROVIDER_SITE_OTHER): Payer: Self-pay

## 2017-05-17 NOTE — Telephone Encounter (Signed)
Phone rang several times then disconnected. Will mail results to patient. Nat Christen, CMA

## 2017-05-17 NOTE — Telephone Encounter (Signed)
-----   Message from Clent Demark, PA-C sent at 05/10/2017  9:04 AM EST ----- Liver enzymes are slowly elevating. This is likely due to the fungal medicine he is taking. Please have patient call his infectious disease doctor and ask about his treatment. I will also message his ID doctor. All other labs normal.

## 2017-05-23 ENCOUNTER — Telehealth: Payer: Self-pay | Admitting: Pharmacist Clinician (PhC)/ Clinical Pharmacy Specialist

## 2017-05-23 NOTE — Telephone Encounter (Signed)
[name] called this AM and said that he has been on hold with [employer] for a while for his Noxafil refill and he would like [place] to call to see if we can make it faster. [facility] said that he has to call in for refills. The clinic can't do it.

## 2017-05-23 NOTE — Telephone Encounter (Signed)
I am not sure what we can do about this I am confused

## 2017-05-24 ENCOUNTER — Other Ambulatory Visit: Payer: Self-pay

## 2017-05-24 ENCOUNTER — Encounter (HOSPITAL_COMMUNITY): Payer: Self-pay | Admitting: *Deleted

## 2017-05-24 ENCOUNTER — Ambulatory Visit (HOSPITAL_COMMUNITY)
Admission: RE | Admit: 2017-05-24 | Discharge: 2017-05-24 | Disposition: A | Discharge status: Home / Self Care | Payer: Self-pay | Source: Ambulatory Visit | Attending: Physician Assistant | Admitting: Physician Assistant

## 2017-05-24 ENCOUNTER — Inpatient Hospital Stay (HOSPITAL_COMMUNITY)
Admission: EM | Admit: 2017-05-24 | Discharge: 2017-05-28 | DRG: 540 | Disposition: A | Discharge status: Home / Self Care | Payer: Self-pay | Attending: Family Medicine | Admitting: Family Medicine

## 2017-05-24 ENCOUNTER — Telehealth (INDEPENDENT_AMBULATORY_CARE_PROVIDER_SITE_OTHER): Payer: Self-pay | Admitting: Physician Assistant

## 2017-05-24 DIAGNOSIS — M869 Osteomyelitis, unspecified: Secondary | ICD-10-CM

## 2017-05-24 DIAGNOSIS — M868X9 Other osteomyelitis, unspecified sites: Secondary | ICD-10-CM | POA: Insufficient documentation

## 2017-05-24 DIAGNOSIS — F17210 Nicotine dependence, cigarettes, uncomplicated: Secondary | ICD-10-CM | POA: Diagnosis present

## 2017-05-24 DIAGNOSIS — I110 Hypertensive heart disease with heart failure: Secondary | ICD-10-CM | POA: Diagnosis present

## 2017-05-24 DIAGNOSIS — B3789 Other sites of candidiasis: Secondary | ICD-10-CM | POA: Diagnosis present

## 2017-05-24 DIAGNOSIS — Z8249 Family history of ischemic heart disease and other diseases of the circulatory system: Secondary | ICD-10-CM

## 2017-05-24 DIAGNOSIS — M4644 Discitis, unspecified, thoracic region: Secondary | ICD-10-CM | POA: Insufficient documentation

## 2017-05-24 DIAGNOSIS — I10 Essential (primary) hypertension: Secondary | ICD-10-CM | POA: Diagnosis present

## 2017-05-24 DIAGNOSIS — Z23 Encounter for immunization: Secondary | ICD-10-CM

## 2017-05-24 DIAGNOSIS — E876 Hypokalemia: Secondary | ICD-10-CM | POA: Diagnosis present

## 2017-05-24 DIAGNOSIS — J449 Chronic obstructive pulmonary disease, unspecified: Secondary | ICD-10-CM | POA: Diagnosis present

## 2017-05-24 DIAGNOSIS — I5032 Chronic diastolic (congestive) heart failure: Secondary | ICD-10-CM | POA: Diagnosis present

## 2017-05-24 DIAGNOSIS — M4640 Discitis, unspecified, site unspecified: Secondary | ICD-10-CM | POA: Diagnosis present

## 2017-05-24 DIAGNOSIS — M4620 Osteomyelitis of vertebra, site unspecified: Secondary | ICD-10-CM

## 2017-05-24 DIAGNOSIS — G8929 Other chronic pain: Secondary | ICD-10-CM | POA: Diagnosis present

## 2017-05-24 DIAGNOSIS — M4624 Osteomyelitis of vertebra, thoracic region: Principal | ICD-10-CM | POA: Diagnosis present

## 2017-05-24 DIAGNOSIS — M1288 Other specific arthropathies, not elsewhere classified, other specified site: Secondary | ICD-10-CM | POA: Insufficient documentation

## 2017-05-24 LAB — CBC WITH DIFFERENTIAL/PLATELET
BASOS PCT: 0 %
Basophils Absolute: 0 10*3/uL (ref 0.0–0.1)
EOS ABS: 0.2 10*3/uL (ref 0.0–0.7)
EOS PCT: 3 %
HCT: 42 % (ref 39.0–52.0)
HEMOGLOBIN: 14.8 g/dL (ref 13.0–17.0)
LYMPHS ABS: 2.1 10*3/uL (ref 0.7–4.0)
Lymphocytes Relative: 28 %
MCH: 33.2 pg (ref 26.0–34.0)
MCHC: 35.2 g/dL (ref 30.0–36.0)
MCV: 94.2 fL (ref 78.0–100.0)
MONOS PCT: 8 %
Monocytes Absolute: 0.6 10*3/uL (ref 0.1–1.0)
NEUTROS PCT: 61 %
Neutro Abs: 4.7 10*3/uL (ref 1.7–7.7)
PLATELETS: 282 10*3/uL (ref 150–400)
RBC: 4.46 MIL/uL (ref 4.22–5.81)
RDW: 14.2 % (ref 11.5–15.5)
WBC: 7.5 10*3/uL (ref 4.0–10.5)

## 2017-05-24 LAB — COMPREHENSIVE METABOLIC PANEL
ALBUMIN: 4 g/dL (ref 3.5–5.0)
ALK PHOS: 93 U/L (ref 38–126)
ALT: 108 U/L — ABNORMAL HIGH (ref 17–63)
ANION GAP: 10 (ref 5–15)
AST: 56 U/L — ABNORMAL HIGH (ref 15–41)
BUN: 11 mg/dL (ref 6–20)
CHLORIDE: 99 mmol/L — AB (ref 101–111)
CO2: 28 mmol/L (ref 22–32)
Calcium: 9.9 mg/dL (ref 8.9–10.3)
Creatinine, Ser: 0.99 mg/dL (ref 0.61–1.24)
GFR calc non Af Amer: >60 mL/min (ref >60)
GLUCOSE: 146 mg/dL — AB (ref 65–99)
POTASSIUM: 3 mmol/L — AB (ref 3.5–5.1)
SODIUM: 137 mmol/L (ref 135–145)
Total Bilirubin: 1.1 mg/dL (ref 0.3–1.2)
Total Protein: 9.3 g/dL — ABNORMAL HIGH (ref 6.5–8.1)

## 2017-05-24 LAB — I-STAT TROPONIN, ED: TROPONIN I, POC: 0.01 ng/mL (ref 0.00–0.08)

## 2017-05-24 LAB — I-STAT CG4 LACTIC ACID, ED
LACTIC ACID, VENOUS: 1.76 mmol/L (ref 0.5–1.9)
Lactic Acid, Venous: 2.03 mmol/L (ref 0.5–1.9)

## 2017-05-24 MED ORDER — HYDROMORPHONE HCL 1 MG/ML IJ SOLN
1.0000 mg | INTRAMUSCULAR | Status: DC | PRN
  Administered 2017-05-24 – 2017-05-28 (×20): 1 mg via INTRAVENOUS
  Filled 2017-05-24 (×22): qty 1

## 2017-05-24 MED ORDER — LISINOPRIL 40 MG PO TABS
40.0000 mg | ORAL_TABLET | Freq: Every day | ORAL | Status: DC
  Administered 2017-05-25 – 2017-05-28 (×4): 40 mg via ORAL
  Filled 2017-05-24 (×4): qty 1

## 2017-05-24 MED ORDER — ONDANSETRON HCL 4 MG PO TABS: 4.0000 mg | ORAL_TABLET | Freq: Four times a day (QID) | ORAL | Status: DC | PRN

## 2017-05-24 MED ORDER — AMLODIPINE BESYLATE 10 MG PO TABS
10.0000 mg | ORAL_TABLET | Freq: Every day | ORAL | Status: DC
  Administered 2017-05-25 – 2017-05-28 (×4): 10 mg via ORAL
  Filled 2017-05-24 (×4): qty 1

## 2017-05-24 MED ORDER — LISINOPRIL-HYDROCHLOROTHIAZIDE 20-12.5 MG PO TABS: 2.0000 | ORAL_TABLET | Freq: Every day | ORAL | Status: DC

## 2017-05-24 MED ORDER — SODIUM CHLORIDE 0.9 % IV SOLN
100.0000 mg | INTRAVENOUS | Status: DC
  Filled 2017-05-24: qty 100

## 2017-05-24 MED ORDER — HYDROCHLOROTHIAZIDE 25 MG PO TABS
25.0000 mg | ORAL_TABLET | Freq: Every day | ORAL | Status: DC
  Administered 2017-05-25 – 2017-05-28 (×4): 25 mg via ORAL
  Filled 2017-05-24 (×4): qty 1

## 2017-05-24 MED ORDER — ACETAMINOPHEN 650 MG RE SUPP: 650.0000 mg | Freq: Four times a day (QID) | RECTAL | Status: DC | PRN

## 2017-05-24 MED ORDER — HYDROMORPHONE HCL 1 MG/ML IJ SOLN
1.0000 mg | Freq: Once | INTRAMUSCULAR | Status: AC
  Administered 2017-05-24: 1 mg via INTRAVENOUS
  Filled 2017-05-24: qty 1

## 2017-05-24 MED ORDER — SODIUM CHLORIDE 0.9 % IV SOLN
200.0000 mg | Freq: Once | INTRAVENOUS | Status: AC
  Administered 2017-05-24: 200 mg via INTRAVENOUS
  Filled 2017-05-24: qty 200

## 2017-05-24 MED ORDER — ACETAMINOPHEN 325 MG PO TABS
650.0000 mg | ORAL_TABLET | Freq: Four times a day (QID) | ORAL | Status: DC | PRN
  Administered 2017-05-26 – 2017-05-28 (×4): 650 mg via ORAL
  Filled 2017-05-24 (×5): qty 2

## 2017-05-24 MED ORDER — ONDANSETRON HCL 4 MG/2ML IJ SOLN: 4.0000 mg | Freq: Four times a day (QID) | INTRAMUSCULAR | Status: DC | PRN

## 2017-05-24 MED ORDER — SODIUM CHLORIDE 0.9 % IV SOLN
INTRAVENOUS | Status: DC
  Administered 2017-05-24: 23:00:00 via INTRAVENOUS

## 2017-05-24 MED ORDER — GADOBENATE DIMEGLUMINE 529 MG/ML IV SOLN
20.0000 mL | Freq: Once | INTRAVENOUS | Status: AC | PRN
  Administered 2017-05-24: 20 mL via INTRAVENOUS

## 2017-05-24 MED ORDER — SODIUM CHLORIDE 0.9 % IV BOLUS (SEPSIS)
1000.0000 mL | Freq: Once | INTRAVENOUS | Status: AC
  Administered 2017-05-24: 1000 mL via INTRAVENOUS

## 2017-05-24 MED ORDER — POTASSIUM CHLORIDE CRYS ER 20 MEQ PO TBCR
40.0000 meq | EXTENDED_RELEASE_TABLET | Freq: Once | ORAL | Status: AC
  Administered 2017-05-25: 40 meq via ORAL
  Filled 2017-05-24: qty 2

## 2017-05-24 NOTE — ED Notes (Signed)
Collected dark green for istat lactic acid...2nd test not resulted.

## 2017-05-24 NOTE — ED Notes (Signed)
Pt presents to the ED for back pain and possible infection. Pt came for an MRI and then was called by his PCP stating he had an infection in his spine.
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
## 2017-05-24 NOTE — Telephone Encounter (Signed)
Dr Lourdes Sledge called from  [facility] to let PA Altamease Oiler that Mr [place] MRI show that his thoracic Spine infection is much worse. PA Aware of that and  Temp[place] conctact Mr Isip to advise to go to the hospital now to receive treatment
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
## 2017-05-24 NOTE — ED Notes (Signed)
Informed first nurse of lactic acid of 2.03 mmol/L

## 2017-05-24 NOTE — ED Triage Notes (Addendum)
Pt in per Domenica Fail, pts PCP for abnormal MRI results of the back, per pt he was sent here for IV antibiotics d/t an infection in his back, per MRI report there is concern for osteomylitis, pt denies fever & chills, A&O x4

## 2017-05-25 ENCOUNTER — Encounter (HOSPITAL_COMMUNITY): Payer: Self-pay | Admitting: General Surgery

## 2017-05-25 LAB — COMPREHENSIVE METABOLIC PANEL
ALBUMIN: 3.2 g/dL — AB (ref 3.5–5.0)
ALK PHOS: 79 U/L (ref 38–126)
ALT: 91 U/L — ABNORMAL HIGH (ref 17–63)
ANION GAP: 10 (ref 5–15)
AST: 48 U/L — ABNORMAL HIGH (ref 15–41)
BUN: 10 mg/dL (ref 6–20)
CALCIUM: 8.7 mg/dL — AB (ref 8.9–10.3)
CO2: 26 mmol/L (ref 22–32)
Chloride: 99 mmol/L — ABNORMAL LOW (ref 101–111)
Creatinine, Ser: 0.62 mg/dL (ref 0.61–1.24)
GFR calc Af Amer: >60 mL/min (ref >60)
GFR calc non Af Amer: >60 mL/min (ref >60)
GLUCOSE: 104 mg/dL — AB (ref 65–99)
Potassium: 3.3 mmol/L — ABNORMAL LOW (ref 3.5–5.1)
Sodium: 135 mmol/L (ref 135–145)
Total Bilirubin: 0.6 mg/dL (ref 0.3–1.2)
Total Protein: 7.2 g/dL (ref 6.5–8.1)

## 2017-05-25 LAB — URINALYSIS, ROUTINE W REFLEX MICROSCOPIC
BILIRUBIN URINE: NEGATIVE
Glucose, UA: NEGATIVE mg/dL
Hgb urine dipstick: NEGATIVE
KETONES UR: NEGATIVE mg/dL
Leukocytes, UA: NEGATIVE
NITRITE: NEGATIVE
Protein, ur: NEGATIVE mg/dL
SPECIFIC GRAVITY, URINE: 1.026 (ref 1.005–1.030)
pH: 5 (ref 5.0–8.0)

## 2017-05-25 LAB — CBC WITH DIFFERENTIAL/PLATELET
BASOS ABS: 0 10*3/uL (ref 0.0–0.1)
BASOS PCT: 0 %
EOS PCT: 3 %
Eosinophils Absolute: 0.2 10*3/uL (ref 0.0–0.7)
HEMATOCRIT: 36.7 % — AB (ref 39.0–52.0)
Hemoglobin: 12.7 g/dL — ABNORMAL LOW (ref 13.0–17.0)
Lymphocytes Relative: 41 %
Lymphs Abs: 2.5 10*3/uL (ref 0.7–4.0)
MCH: 32.9 pg (ref 26.0–34.0)
MCHC: 34.6 g/dL (ref 30.0–36.0)
MCV: 95.1 fL (ref 78.0–100.0)
MONO ABS: 0.7 10*3/uL (ref 0.1–1.0)
Monocytes Relative: 11 %
NEUTROS ABS: 2.8 10*3/uL (ref 1.7–7.7)
Neutrophils Relative %: 45 %
PLATELETS: 216 10*3/uL (ref 150–400)
RBC: 3.86 MIL/uL — AB (ref 4.22–5.81)
RDW: 14.3 % (ref 11.5–15.5)
WBC: 6.3 10*3/uL (ref 4.0–10.5)

## 2017-05-25 LAB — MRSA PCR SCREENING: MRSA BY PCR: NEGATIVE

## 2017-05-25 MED ORDER — OXYCODONE HCL 5 MG PO TABS
10.0000 mg | ORAL_TABLET | ORAL | Status: DC | PRN
  Administered 2017-05-25 – 2017-05-28 (×14): 10 mg via ORAL
  Filled 2017-05-25 (×15): qty 2

## 2017-05-25 MED ORDER — SODIUM CHLORIDE 0.9 % IV SOLN
200.0000 mg | INTRAVENOUS | Status: DC
  Administered 2017-05-25 – 2017-05-27 (×3): 200 mg via INTRAVENOUS
  Filled 2017-05-25 (×4): qty 200

## 2017-05-25 NOTE — Progress Notes (Signed)
PROGRESS NOTE    [name]  GEX:[id number] DOB: 04-Nov-1965 DOA: 05/24/2017 PCP: Clent Demark, PA-C   Brief Narrative:  [name] is a 52 y.o. 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  Patient was referred to the ER.  ID recommended biopsy. Consult order placed   Assessment & Plan:   Principal Problem:   Vertebral osteomyelitis ([place]) - Continue prior to admission medication regimen Eraxis. Pt denies missing any doses at home - agree with obtaining biopsy to assess for other causes  Active Problems:   Essential hypertension - Pt on amlodipine and lisinopril  DVT prophylaxis: SCD's Code Status: Full Family Communication: none at bedside. Disposition Plan: Pending improvement in condition and obtainment biopsy. Tentatively to be done Monday 1/7 as scheduling allows.  Consultants:   IR consulted   Procedures: none   Antimicrobials: antifungal listed above.   Subjective: Pt has no new complaints states that his back is better. Would like to transition home soon.  Objective: Vitals:   05/24/17 2130 05/25/17 0014 05/25/17 0300 05/25/17 1438  BP: 125/86 136/80 116/78 121/78  Pulse: 64 64 (!) 49 60  Resp:  16 16   Temp:  98.2 F (36.8 C) 97.7 F (36.5 C) 98 F (36.7 C)  TempSrc:  Oral Oral Oral  SpO2: 94% 97% 97% 100%  Weight:      Height:        Intake/Output Summary (Last 24 hours) at 05/25/2017 1615 Last data filed at 05/24/2017 2127 Gross per 24 hour  Intake 1000 ml  Output -  Net 1000 ml   Filed Weights   05/24/17 1515  Weight: 85.3 kg (188 lb)    Examination:  General exam: Appears calm and  comfortable  Respiratory system: Clear to auscultation. Respiratory effort normal. Cardiovascular system: S1 & S2 heard, RRR. No JVD, murmurs Gastrointestinal system: Abdomen is nondistended, soft and nontender. No organomegaly or masses felt. Normal bowel sounds heard. Central nervous system: Alert and oriented. No red flag symptoms reported (weakness) Extremities: no clubbing, no deformities. Skin: No rashes, lesions or ulcers Psychiatry:  Mood & affect appropriate.     Data Reviewed: I have personally reviewed following labs and imaging studies  CBC: Recent Labs  Lab 05/24/17 1515 05/25/17 0514  WBC 7.5 6.3  NEUTROABS 4.7 2.8  HGB 14.8 12.7*  HCT 42.0 36.7*  MCV 94.2 95.1  PLT 282 010   Basic Metabolic Panel: Recent Labs  Lab 05/24/17 1515 05/25/17 0514  NA 137 135  K 3.0* 3.3*  CL 99* 99*  CO2 28 26  GLUCOSE 146* 104*  BUN 11 10  CREATININE 0.99 0.62  CALCIUM 9.9 8.7*   GFR: Estimated Creatinine Clearance: 116.9 mL/min (by C-G formula based on SCr of 0.62 mg/dL). Liver Function Tests: Recent Labs  Lab 05/24/17 1515 05/25/17 0514  AST 56* 48*  ALT 108* 91*  ALKPHOS 93 79  BILITOT 1.1 0.6  PROT 9.3* 7.2  ALBUMIN 4.0 3.2*   No results for input(s): LIPASE, AMYLASE in the last 168 hours. No results for input(s): AMMONIA in the last 168 hours. Coagulation Profile: No results for input(s): INR, PROTIME in the last 168 hours. Cardiac Enzymes: No  results for input(s): CKTOTAL, CKMB, CKMBINDEX, TROPONINI in the last 168 hours. BNP (last 3 results) No results for input(s): PROBNP in the last 8760 hours. HbA1C: No results for input(s): HGBA1C in the last 72 hours. CBG: No results for input(s): GLUCAP in the last 168 hours. Lipid Profile: No results for input(s): CHOL, HDL, LDLCALC, TRIG, CHOLHDL, LDLDIRECT in the last 72 hours. Thyroid Function Tests: No results for input(s): TSH, T4TOTAL, FREET4, T3FREE, THYROIDAB in the last 72 hours. Anemia Panel: No  results for input(s): VITAMINB12, FOLATE, FERRITIN, TIBC, IRON, RETICCTPCT in the last 72 hours. Sepsis Labs: Recent Labs  Lab 05/24/17 1536 05/24/17 2008  LATICACIDVEN 2.03* 1.76    Recent Results (from the past 240 hour(s))  Blood culture (routine x 2)     Status: None (Preliminary result)   Collection Time: 05/24/17  7:35 PM  Result Value Ref Range Status   Specimen Description BLOOD RIGHT ANTECUBITAL  Final   Special Requests IN PEDIATRIC BOTTLE Blood Culture adequate volume  Final   Culture NO GROWTH < 12 HOURS  Final   Report Status PENDING  Incomplete  Fungus culture, blood     Status: None (Preliminary result)   Collection Time: 05/24/17  7:35 PM  Result Value Ref Range Status   Specimen Description BLOOD RIGHT ANTECUBITAL  Final   Special Requests   Final    BOTTLES DRAWN AEROBIC AND ANAEROBIC Blood Culture adequate volume   Culture NO GROWTH < 12 HOURS  Final   Report Status PENDING  Incomplete  Blood culture (routine x 2)     Status: None (Preliminary result)   Collection Time: 05/24/17  7:50 PM  Result Value Ref Range Status   Specimen Description BLOOD LEFT ANTECUBITAL  Final   Special Requests   Final    BOTTLES DRAWN AEROBIC AND ANAEROBIC Blood Culture adequate volume   Culture NO GROWTH < 12 HOURS  Final   Report Status PENDING  Incomplete  MRSA PCR Screening     Status: None   Collection Time: 05/24/17 10:59 PM  Result Value Ref Range Status   MRSA by PCR NEGATIVE NEGATIVE Final    Comment:        The GeneXpert MRSA Assay (FDA approved for NASAL specimens only), is one component of a comprehensive MRSA colonization surveillance program. It is not intended to diagnose MRSA infection nor to guide or monitor treatment for MRSA infections.          Radiology Studies: Mr Thoracic Spine W Wo Contrast  Result Date: 05/24/2017 CLINICAL DATA:  Patient with a history of polysubstance abuse, endocarditis and discitis and osteomyelitis at T11-12. The  patient was discharged from the hospital 03/08/2017 after treatment for diagnosis of candidal discitis. EXAM: MRI THORACIC WITHOUT AND WITH CONTRAST TECHNIQUE: Multiplanar and multiecho pulse sequences of the thoracic spine were obtained without and with intravenous contrast. CONTRAST:  20 ml MULTIHANCE GADOBENATE DIMEGLUMINE 529 MG/ML IV SOLN COMPARISON:  MRI thoracic spine 03/13/2017. FINDINGS: MRI THORACIC SPINE FINDINGS Alignment: There has been some increase in kyphosis about the T11-12 level due to progressive endplate destructive change as described below. Vertebrae: There is persistent intense edema and enhancement throughout the T11 and T12 vertebral bodies with fluid in the disc interspace. The facet joints are also edematous and enhancing consistent with septic facet arthritis. There is new edema and postcontrast enhancement in the posterior aspects of the T2-T6 and T8-T10 vertebral bodies and in the T8, T9 and T10 spinous processes. Changes are worst in  T8 were approximately 50% of the posterior aspect of vertebral body is involved. Cord: Mild edema in the distal cord centered at the T11-12 level is unchanged. Paraspinal and other soft tissues: Paraspinous abscess formation seen on the prior examination has worsened. A collection along the left side of the T11-12 vertebral body which had measured 1.9 cm AP by 0.9 cm transverse today measures 3.1 cm AP x 1.4 cm transverse. This collection is incompletely imaged in the sagittal plane but is approximately 2.3 cm craniocaudal. There is a new phlegmon or early abscess along the right side of the disc interspace measuring 2.2 cm AP x 1.6 cm transverse. Prevertebral edema and enhancement extend from T10-L1. Disc levels: There is flattening of the distal cord at the T11-12 level due to phlegmon and retropulsed bone off the superior endplate of K99. Multilevel degenerative disc disease is unchanged. IMPRESSION: Marked worsening in the appearance of discitis,  osteomyelitis and septic facet arthropathy at T11-12. Paraspinous abscess on the left at this level has increased in size there is new phlegmon or early abscess on the right. Edema in the cord at this level due to compression from retropulsed bone and phlegmon is unchanged. New marrow edema and enhancement in the T2-T7 and T8-T10 vertebral bodies and the spinous processes of T8-T10 is highly suspicious for osteomyelitis. Given absence of involvement of the intervertebral discs, tuberculosis is a consideration. Electronically Signed   By: Inge Rise M.D.   On: 05/24/2017 13:48    Scheduled Meds: . amLODipine  10 mg Oral Daily  . hydrochlorothiazide  25 mg Oral Daily  . lisinopril  40 mg Oral Daily   Continuous Infusions: . sodium chloride 10 mL/hr at 05/24/17 2240  . anidulafungin       LOS: 1 day    Time spent: > 35 minutes  Velvet Bathe, MD [facility] Pager [phone]  If 7PM-7AM, please contact night-coverage [email or web address] Password [email or web address] 05/25/2017, 4:15 PM
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
## 2017-05-26 LAB — PROTIME-INR
INR: 1.02
Prothrombin Time: 13.3 seconds (ref 11.4–15.2)

## 2017-05-26 MED ORDER — INFLUENZA VAC SPLIT QUAD 0.5 ML IM SUSY
0.5000 mL | PREFILLED_SYRINGE | INTRAMUSCULAR | Status: AC
  Administered 2017-05-28: 0.5 mL via INTRAMUSCULAR
  Filled 2017-05-26: qty 0.5

## 2017-05-26 MED ORDER — PNEUMOCOCCAL VAC POLYVALENT 25 MCG/0.5ML IJ INJ
0.5000 mL | INJECTION | INTRAMUSCULAR | Status: AC
  Administered 2017-05-28: 0.5 mL via INTRAMUSCULAR
  Filled 2017-05-26: qty 0.5

## 2017-05-26 NOTE — Progress Notes (Signed)
PROGRESS NOTE    [name]  UDJ:[id number] DOB: Nov 02, 1965 DOA: 05/24/2017 PCP: Clent Demark, PA-C   Brief Narrative:  [name] is a 52 y.o. 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  Patient was referred to the ER.  ID recommended biopsy. Consult order placed  Assessment & Plan:   Principal Problem:   Vertebral osteomyelitis ([place]) - Continue prior to admission medication regimen Eraxis. Pt denies missing any doses at home - agree with obtaining biopsy to assess for other causes, currently awaiting bx scheduled for tomorrow.  Active Problems:   Essential hypertension - Pt on amlodipine and lisinopril  DVT prophylaxis: SCD's Code Status: Full Family Communication: none at bedside. Disposition Plan: Pending improvement in condition and obtainment biopsy. Tentatively to be done Monday 1/7 as scheduling allows.  Consultants:   IR consulted   Procedures: none   Antimicrobials: antifungal listed above.   Subjective: No new complaints reported.  Objective: Vitals:   05/25/17 2136 05/26/17 0514 05/26/17 1015 05/26/17 1512  BP: 121/73 115/78 138/85 129/80  Pulse: (!) 49 (!) 47 62 (!) 55  Resp: 16 16  17   Temp: 99 F (37.2 C) 98.1 F (36.7 C)  98 F (36.7 C)  TempSrc: Oral Oral    SpO2: 98% 99%  95%  Weight:      Height:        Intake/Output Summary (Last 24 hours) at 05/26/2017 1519 Last data filed at 05/26/2017 1512 Gross per 24 hour  Intake 596 ml  Output -  Net 596 ml   Filed Weights   05/24/17 1515  Weight: 85.3 kg (188 lb)    Examination: Exam unchanged when compared to 05/25/2017  General  exam: Appears calm and comfortable  Respiratory system: Clear to auscultation. Respiratory effort normal. Cardiovascular system: S1 & S2 heard, RRR. No JVD, murmurs Gastrointestinal system: Abdomen is nondistended, soft and nontender. No organomegaly or masses felt. Normal bowel sounds heard. Central nervous system: Alert and oriented. No red flag symptoms reported (weakness) Extremities: no clubbing, no deformities. Skin: No rashes, lesions or ulcers Psychiatry:  Mood & affect appropriate.     Data Reviewed: I have personally reviewed following labs and imaging studies  CBC: Recent Labs  Lab 05/24/17 1515 05/25/17 0514  WBC 7.5 6.3  NEUTROABS 4.7 2.8  HGB 14.8 12.7*  HCT 42.0 36.7*  MCV 94.2 95.1  PLT 282 588   Basic Metabolic Panel: Recent Labs  Lab 05/24/17 1515 05/25/17 0514  NA 137 135  K 3.0* 3.3*  CL 99* 99*  CO2 28 26  GLUCOSE 146* 104*  BUN 11 10  CREATININE 0.99 0.62  CALCIUM 9.9 8.7*   GFR: Estimated Creatinine Clearance: 116.9 mL/min (by C-G formula based on SCr of 0.62 mg/dL). Liver Function Tests: Recent Labs  Lab 05/24/17 1515 05/25/17 0514  AST 56* 48*  ALT 108* 91*  ALKPHOS 93 79  BILITOT 1.1 0.6  PROT 9.3* 7.2  ALBUMIN 4.0 3.2*   No results for input(s): LIPASE, AMYLASE in the last 168 hours. No results for input(s): AMMONIA in the last 168 hours. Coagulation Profile: Recent Labs  Lab 05/26/17 0348  INR 1.02   Cardiac Enzymes: No  results for input(s): CKTOTAL, CKMB, CKMBINDEX, TROPONINI in the last 168 hours. BNP (last 3 results) No results for input(s): PROBNP in the last 8760 hours. HbA1C: No results for input(s): HGBA1C in the last 72 hours. CBG: No results for input(s): GLUCAP in the last 168 hours. Lipid Profile: No results for input(s): CHOL, HDL, LDLCALC, TRIG, CHOLHDL, LDLDIRECT in the last 72 hours. Thyroid Function Tests: No results for input(s): TSH, T4TOTAL, FREET4, T3FREE, THYROIDAB in the last 72 hours. Anemia  Panel: No results for input(s): VITAMINB12, FOLATE, FERRITIN, TIBC, IRON, RETICCTPCT in the last 72 hours. Sepsis Labs: Recent Labs  Lab 05/24/17 1536 05/24/17 2008  LATICACIDVEN 2.03* 1.76    Recent Results (from the past 240 hour(s))  Blood culture (routine x 2)     Status: None (Preliminary result)   Collection Time: 05/24/17  7:35 PM  Result Value Ref Range Status   Specimen Description BLOOD RIGHT ANTECUBITAL  Final   Special Requests IN PEDIATRIC BOTTLE Blood Culture adequate volume  Final   Culture NO GROWTH 2 DAYS  Final   Report Status PENDING  Incomplete  Fungus culture, blood     Status: None (Preliminary result)   Collection Time: 05/24/17  7:35 PM  Result Value Ref Range Status   Specimen Description BLOOD RIGHT ANTECUBITAL  Final   Special Requests   Final    BOTTLES DRAWN AEROBIC AND ANAEROBIC Blood Culture adequate volume   Culture NO GROWTH 2 DAYS  Final   Report Status PENDING  Incomplete  Blood culture (routine x 2)     Status: None (Preliminary result)   Collection Time: 05/24/17  7:50 PM  Result Value Ref Range Status   Specimen Description BLOOD LEFT ANTECUBITAL  Final   Special Requests   Final    BOTTLES DRAWN AEROBIC AND ANAEROBIC Blood Culture adequate volume   Culture NO GROWTH 2 DAYS  Final   Report Status PENDING  Incomplete  MRSA PCR Screening     Status: None   Collection Time: 05/24/17 10:59 PM  Result Value Ref Range Status   MRSA by PCR NEGATIVE NEGATIVE Final    Comment:        The GeneXpert MRSA Assay (FDA approved for NASAL specimens only), is one component of a comprehensive MRSA colonization surveillance program. It is not intended to diagnose MRSA infection nor to guide or monitor treatment for MRSA infections.      Radiology Studies: No results found.  Scheduled Meds: . amLODipine  10 mg Oral Daily  . hydrochlorothiazide  25 mg Oral Daily  . lisinopril  40 mg Oral Daily   Continuous Infusions: . sodium chloride 10  mL/hr at 05/24/17 2240  . anidulafungin Stopped (05/26/17 0221)     LOS: 2 days    Time spent: > 35 minutes  Velvet Bathe, MD [facility] Pager [phone]  If 7PM-7AM, please contact night-coverage [email or web address] Password [facility] 05/26/2017, 3:19 PM

## 2017-05-27 ENCOUNTER — Inpatient Hospital Stay (HOSPITAL_COMMUNITY): Payer: Self-pay

## 2017-05-27 ENCOUNTER — Other Ambulatory Visit: Payer: Self-pay | Admitting: Radiology

## 2017-05-27 ENCOUNTER — Encounter (HOSPITAL_COMMUNITY): Payer: Self-pay | Admitting: *Deleted

## 2017-05-27 MED ORDER — MIDAZOLAM HCL 2 MG/2ML IJ SOLN
INTRAMUSCULAR | Status: AC
  Filled 2017-05-27: qty 6

## 2017-05-27 MED ORDER — HYDROMORPHONE HCL 1 MG/ML IJ SOLN
0.5000 mg | Freq: Once | INTRAMUSCULAR | Status: AC
  Administered 2017-05-27: 0.5 mg via INTRAVENOUS
  Filled 2017-05-27: qty 1

## 2017-05-27 MED ORDER — DOCUSATE SODIUM 100 MG PO CAPS
100.0000 mg | ORAL_CAPSULE | Freq: Two times a day (BID) | ORAL | Status: DC
  Administered 2017-05-28: 100 mg via ORAL
  Filled 2017-05-27: qty 1

## 2017-05-27 MED ORDER — FENTANYL CITRATE (PF) 100 MCG/2ML IJ SOLN
INTRAMUSCULAR | Status: AC
  Filled 2017-05-27: qty 4

## 2017-05-27 MED ORDER — DOCUSATE SODIUM 100 MG PO CAPS
200.0000 mg | ORAL_CAPSULE | Freq: Once | ORAL | Status: AC
  Administered 2017-05-27: 200 mg via ORAL
  Filled 2017-05-27: qty 2

## 2017-05-27 MED ORDER — HYDROMORPHONE HCL 1 MG/ML IJ SOLN
INTRAMUSCULAR | Status: AC
  Filled 2017-05-27: qty 0.5

## 2017-05-27 MED ORDER — SENNOSIDES-DOCUSATE SODIUM 8.6-50 MG PO TABS
2.0000 | ORAL_TABLET | Freq: Once | ORAL | Status: AC
  Administered 2017-05-27: 2 via ORAL
  Filled 2017-05-27: qty 2

## 2017-05-27 MED ORDER — BUPIVACAINE HCL (PF) 0.5 % IJ SOLN
INTRAMUSCULAR | Status: AC
  Filled 2017-05-27: qty 30

## 2017-05-27 NOTE — Progress Notes (Signed)
Called to 5N RN again. She states that she received an admission and has not been able to check with this pt [name]) regarding NPO status and blood thinners. She will return call to this RN.

## 2017-05-27 NOTE — Progress Notes (Signed)
PROGRESS NOTE    [name]  KNL:[id number] DOB: [birth year] DOA: 05/24/2017 PCP: Clent Demark, PA-C   Brief Narrative:  [name] is a 52 y.o. 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  Patient was referred to the ER.  ID recommended biopsy. Consult order placed  Assessment & Plan:   Principal Problem:   Vertebral osteomyelitis ([place]) - Continue prior to admission medication regimen Eraxis. Pt denies missing any doses at home - agree with obtaining biopsy to assess for other causes, currently awaiting bx scheduled for today. IR on board and will perform biopsy. - Further investigation needs to occur as such plans for biopsy and consulted IR replaced because vertebral osteomyelitis worsen despite patient being on antifungal which should've improved his general condition.  Active Problems:   Essential hypertension - Pt on amlodipine and lisinopril  DVT prophylaxis: SCD's Code Status: Full Family Communication: none at bedside. Disposition Plan: Awaiting biopsy  Consultants:   IR consulted   Procedures: none   Antimicrobials: antifungal listed above.   Subjective: The patient has no new complaints.  Objective: Vitals:   05/26/17 2030 05/27/17 0507 05/27/17 0950 05/27/17 1446  BP: (!) 137/91 135/85 (!) 138/93 125/74  Pulse: 64 60  (!) 51  Resp: 18 18    Temp: 98.2 F (36.8 C) 98.2 F (36.8 C)  97.8 F (36.6 C)  TempSrc: Oral Oral  Oral  SpO2: 98% 98%  98%  Weight:      Height:        Intake/Output Summary (Last 24 hours) at 05/27/2017 1532 Last data filed at 05/27/2017 1100 Gross per 24 hour  Intake 1533.33  ml  Output -  Net 1533.33 ml   Filed Weights   05/24/17 1515  Weight: 85.3 kg (188 lb)    Examination: Exam unchanged when compared to 05/26/2017  General exam: Appears calm and comfortable  Respiratory system: Clear to auscultation. Respiratory effort normal. Cardiovascular system: S1 & S2 heard, RRR. No JVD, murmurs Gastrointestinal system: Abdomen is nondistended, soft and nontender. No organomegaly or masses felt. Normal bowel sounds heard. Central nervous system: Alert and oriented. No red flag symptoms reported (weakness) Extremities: no clubbing, no deformities. Skin: No rashes, lesions or ulcers Psychiatry:  Mood & affect appropriate.     Data Reviewed: I have personally reviewed following labs and imaging studies  CBC: Recent Labs  Lab 05/24/17 1515 05/25/17 0514  WBC 7.5 6.3  NEUTROABS 4.7 2.8  HGB 14.8 12.7*  HCT 42.0 36.7*  MCV 94.2 95.1  PLT 282 790   Basic Metabolic Panel: Recent Labs  Lab 05/24/17 1515 05/25/17 0514  NA 137 135  K 3.0* 3.3*  CL 99* 99*  CO2 28 26  GLUCOSE 146* 104*  BUN 11 10  CREATININE 0.99 0.62  CALCIUM 9.9 8.7*   GFR: Estimated Creatinine Clearance: 116.9 mL/min (by C-G formula based on SCr of 0.62 mg/dL). Liver Function Tests: Recent Labs  Lab 05/24/17 1515 05/25/17 0514  AST 56* 48*  ALT 108* 91*  ALKPHOS 93 79  BILITOT 1.1 0.6  PROT 9.3* 7.2  ALBUMIN 4.0 3.2*   No results for input(s): LIPASE, AMYLASE in the last 168 hours. No  results for input(s): AMMONIA in the last 168 hours. Coagulation Profile: Recent Labs  Lab 05/26/17 0348  INR 1.02   Cardiac Enzymes: No results for input(s): CKTOTAL, CKMB, CKMBINDEX, TROPONINI in the last 168 hours. BNP (last 3 results) No results for input(s): PROBNP in the last 8760 hours. HbA1C: No results for input(s): HGBA1C in the last 72 hours. CBG: No results for input(s): GLUCAP in the last 168 hours. Lipid Profile: No results for input(s): CHOL, HDL, LDLCALC,  TRIG, CHOLHDL, LDLDIRECT in the last 72 hours. Thyroid Function Tests: No results for input(s): TSH, T4TOTAL, FREET4, T3FREE, THYROIDAB in the last 72 hours. Anemia Panel: No results for input(s): VITAMINB12, FOLATE, FERRITIN, TIBC, IRON, RETICCTPCT in the last 72 hours. Sepsis Labs: Recent Labs  Lab 05/24/17 1536 05/24/17 2008  LATICACIDVEN 2.03* 1.76    Recent Results (from the past 240 hour(s))  Blood culture (routine x 2)     Status: None (Preliminary result)   Collection Time: 05/24/17  7:35 PM  Result Value Ref Range Status   Specimen Description BLOOD RIGHT ANTECUBITAL  Final   Special Requests IN PEDIATRIC BOTTLE Blood Culture adequate volume  Final   Culture NO GROWTH 3 DAYS  Final   Report Status PENDING  Incomplete  Fungus culture, blood     Status: None (Preliminary result)   Collection Time: 05/24/17  7:35 PM  Result Value Ref Range Status   Specimen Description BLOOD RIGHT ANTECUBITAL  Final   Special Requests   Final    BOTTLES DRAWN AEROBIC AND ANAEROBIC Blood Culture adequate volume   Culture NO GROWTH 3 DAYS  Final   Report Status PENDING  Incomplete  Blood culture (routine x 2)     Status: None (Preliminary result)   Collection Time: 05/24/17  7:50 PM  Result Value Ref Range Status   Specimen Description BLOOD LEFT ANTECUBITAL  Final   Special Requests   Final    BOTTLES DRAWN AEROBIC AND ANAEROBIC Blood Culture adequate volume   Culture NO GROWTH 3 DAYS  Final   Report Status PENDING  Incomplete  MRSA PCR Screening     Status: None   Collection Time: 05/24/17 10:59 PM  Result Value Ref Range Status   MRSA by PCR NEGATIVE NEGATIVE Final    Comment:        The GeneXpert MRSA Assay (FDA approved for NASAL specimens only), is one component of a comprehensive MRSA colonization surveillance program. It is not intended to diagnose MRSA infection nor to guide or monitor treatment for MRSA infections.      Radiology Studies: No results  found.  Scheduled Meds: . amLODipine  10 mg Oral Daily  . hydrochlorothiazide  25 mg Oral Daily  . Influenza vac split quadrivalent PF  0.5 mL Intramuscular Tomorrow-1000  . lisinopril  40 mg Oral Daily  . pneumococcal 23 valent vaccine  0.5 mL Intramuscular Tomorrow-1000   Continuous Infusions: . sodium chloride 10 mL/hr at 05/24/17 2240  . anidulafungin Stopped (05/27/17 0318)     LOS: 3 days    Time spent: > 35 minutes  Velvet Bathe, MD [facility] Pager [phone]  If 7PM-7AM, please contact night-coverage [email or web address] Password [facility] 05/27/2017, 3:32 PM

## 2017-05-27 NOTE — Progress Notes (Signed)
Sent for pt to come to IR for procedure. RN states that she and pt thought that procedure would be tomorrow. Advised RN that PA note states that procedure would be today. She states that she will check to see if pt is NPO and if blood thinners have been given. RN states that she will call me back with this info.

## 2017-05-28 ENCOUNTER — Other Ambulatory Visit (HOSPITAL_COMMUNITY): Payer: Self-pay

## 2017-05-28 ENCOUNTER — Ambulatory Visit: Payer: Self-pay | Admitting: Infectious Disease

## 2017-05-28 ENCOUNTER — Inpatient Hospital Stay (HOSPITAL_COMMUNITY): Payer: Self-pay

## 2017-05-28 HISTORY — PX: IR FLUORO GUIDED NEEDLE PLC ASPIRATION/INJECTION LOC: IMG2395

## 2017-05-28 MED ORDER — MIDAZOLAM HCL 2 MG/2ML IJ SOLN
INTRAMUSCULAR | Status: AC
  Filled 2017-05-28: qty 4

## 2017-05-28 MED ORDER — HYDROMORPHONE HCL 1 MG/ML IJ SOLN
INTRAMUSCULAR | Status: AC | PRN
  Administered 2017-05-28: 1 mg via INTRAVENOUS

## 2017-05-28 MED ORDER — IOPAMIDOL (ISOVUE-300) INJECTION 61%
INTRAVENOUS | Status: AC
  Filled 2017-05-28: qty 50

## 2017-05-28 MED ORDER — SODIUM CHLORIDE 0.9 % IV SOLN
INTRAVENOUS | Status: DC
  Administered 2017-05-28: 12:00:00 via INTRAVENOUS

## 2017-05-28 MED ORDER — FENTANYL CITRATE (PF) 100 MCG/2ML IJ SOLN
INTRAMUSCULAR | Status: AC
  Filled 2017-05-28: qty 4

## 2017-05-28 MED ORDER — OXYCODONE HCL 15 MG PO TABS: 15.0000 mg | ORAL_TABLET | Freq: Four times a day (QID) | ORAL | 0 refills | Status: DC | PRN

## 2017-05-28 MED ORDER — BUPIVACAINE HCL (PF) 0.5 % IJ SOLN
INTRAMUSCULAR | Status: AC
  Filled 2017-05-28: qty 30

## 2017-05-28 MED ORDER — SODIUM CHLORIDE 0.9 % IV SOLN
INTRAVENOUS | Status: AC | PRN
  Administered 2017-05-28: 10 mL/h via INTRAVENOUS

## 2017-05-28 MED ORDER — FENTANYL CITRATE (PF) 100 MCG/2ML IJ SOLN
INTRAMUSCULAR | Status: AC | PRN
  Administered 2017-05-28: 25 ug via INTRAVENOUS

## 2017-05-28 MED ORDER — MIDAZOLAM HCL 2 MG/2ML IJ SOLN
INTRAMUSCULAR | Status: AC | PRN
  Administered 2017-05-28: 1 mg via INTRAVENOUS

## 2017-05-28 MED ORDER — BUPIVACAINE HCL 0.25 % IJ SOLN
INTRAMUSCULAR | Status: AC | PRN
  Administered 2017-05-28: 15 mL

## 2017-05-28 MED ORDER — POLYETHYLENE GLYCOL 3350 17 G PO PACK: 17.0000 g | PACK | Freq: Every day | ORAL | 0 refills | Status: DC

## 2017-05-28 MED ORDER — HYDROMORPHONE HCL 1 MG/ML IJ SOLN
INTRAMUSCULAR | Status: AC
  Filled 2017-05-28: qty 1

## 2017-05-28 NOTE — Sedation Documentation (Signed)
Patient is resting comfortably. 

## 2017-05-28 NOTE — Progress Notes (Signed)
The patient was given specific written and verbal instructions for discharge.  The dressing on his back is dry and intact.  The patient has been ambulatory without any difficulty.  Patient has scheduled appointment and verbalizes understanding.

## 2017-05-28 NOTE — Procedures (Signed)
S/P T 11 T 12 disc aspiration.  Approx 8 ml of thick bloody aspirate obtained

## 2017-05-28 NOTE — Discharge Summary (Addendum)
Physician Discharge Summary  [name] [id number] DOB: Dec 22, 1965 DOA: 05/24/2017  PCP: Clent Demark, PA-C  Admit date: 05/24/2017 Discharge date: 05/28/2017  Time spent: > 35 minutes  Recommendations for Outpatient Follow-up:  1. Ensure patient f/u with ID and primary care physician.   Discharge Diagnoses:  Principal Problem:   Vertebral osteomyelitis ([place]) Active Problems:   Essential hypertension   Discitis   Discharge Condition: stable  Diet recommendation: heart healthy  Filed Weights   05/24/17 1515  Weight: 85.3 kg (188 lb)    History of present illness:  52 y.o. 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  Hospital Course:  Principal Problem:   Vertebral osteomyelitis ([place]) - Will continue antifungal and provide pain medication regimen - Pt will follow up with ID for biopsy results and adjustment in medications - Pt reporting improvement in condition and would like to be discharged.  Active Problems:   Essential hypertension - Pt on amlodipine and lisinopril will continue on discharge  Procedures:  Radiology for IR lumbar Disc Aspiraton  Consultations:  Radiology IR  Discharge Exam: Vitals:   05/28/17 0932 05/28/17 0940  BP: 113/69 113/83  Pulse: (!) 46 (!) 52  Resp: (!) 7 14  Temp:    SpO2: 98% 96%    General: Pt in nad, alert and awake Cardiovascular:rrr, no rubs Respiratory: CTA BL, no wheezes  Discharge Instructions   Discharge Instructions    Call MD for:  redness, tenderness, or signs of infection (pain, swelling, redness, odor or green/yellow discharge around incision site)   Complete by:  As directed    Call MD for:  severe uncontrolled  pain   Complete by:  As directed    Call MD for:  temperature >100.4   Complete by:  As directed    Diet - low sodium heart healthy   Complete by:  As directed    Increase activity slowly   Complete by:  As directed      Allergies as of 05/28/2017      Reactions   Penicillins    From childhood: Has patient had a PCN reaction causing immediate rash, facial/tongue/throat swelling, SOB or lightheadedness with hypotension: Unknown Has patient had a PCN reaction causing severe rash involving mucus membranes or skin necrosis: Unknown Has patient had a PCN reaction that required hospitalization: Unknown Has patient had a PCN reaction occurring within the last 10 years: No If all of the above answers are "NO", then may proceed with Cephalosporin use.      Medication List    STOP taking these medications   ibuprofen 200 MG tablet Commonly known as:  ADVIL,MOTRIN   ibuprofen 800 MG tablet Commonly known as:  ADVIL,MOTRIN   oxyCODONE-acetaminophen 10-325 MG tablet Commonly known as:  PERCOCET     TAKE these medications   amLODipine 10 MG tablet Commonly known as:  NORVASC Take 1 tablet (10 mg total) by mouth daily.   carvedilol 6.25 MG tablet Commonly known as:  COREG Take 1 tablet (6.25 mg total) by mouth 2 (two) times daily with a meal.   ENSURE Take 237 mLs by mouth daily.   lisinopril-hydrochlorothiazide 20-12.5 MG tablet Commonly known as:  ZESTORETIC Take 2 tablets by mouth daily.   OVER THE  COUNTER MEDICATION Take 1 tablet by mouth daily.   oxyCODONE 15 MG immediate release tablet Commonly known as:  ROXICODONE Take 1 tablet (15 mg total) by mouth every 6 (six) hours as needed for up to 14 days for moderate pain.   polyethylene glycol packet Commonly known as:  MIRALAX / GLYCOLAX Take 17 g by mouth daily.   posaconazole 100 MG Tbec delayed-release tablet Commonly known as:  NOXAFIL Take 3 tablets (300 mg total) daily by mouth.      Allergies  Allergen  Reactions  . Penicillins     From childhood: Has patient had a PCN reaction causing immediate rash, facial/tongue/throat swelling, SOB or lightheadedness with hypotension: Unknown Has patient had a PCN reaction causing severe rash involving mucus membranes or skin necrosis: Unknown Has patient had a PCN reaction that required hospitalization: Unknown Has patient had a PCN reaction occurring within the last 10 years: No If all of the above answers are "NO", then may proceed with Cephalosporin use.       The results of significant diagnostics from this hospitalization (including imaging, microbiology, ancillary and laboratory) are listed below for reference.    Significant Diagnostic Studies: Mr Thoracic Spine W Wo Contrast  Result Date: 05/24/2017 CLINICAL DATA:  Patient with a history of polysubstance abuse, endocarditis and discitis and osteomyelitis at T11-12. The patient was discharged from the hospital 03/08/2017 after treatment for diagnosis of candidal discitis. EXAM: MRI THORACIC WITHOUT AND WITH CONTRAST TECHNIQUE: Multiplanar and multiecho pulse sequences of the thoracic spine were obtained without and with intravenous contrast. CONTRAST:  20 ml MULTIHANCE GADOBENATE DIMEGLUMINE 529 MG/ML IV SOLN COMPARISON:  MRI thoracic spine 03/13/2017. FINDINGS: MRI THORACIC SPINE FINDINGS Alignment: There has been some increase in kyphosis about the T11-12 level due to progressive endplate destructive change as described below. Vertebrae: There is persistent intense edema and enhancement throughout the T11 and T12 vertebral bodies with fluid in the disc interspace. The facet joints are also edematous and enhancing consistent with septic facet arthritis. There is new edema and postcontrast enhancement in the posterior aspects of the T2-T6 and T8-T10 vertebral bodies and in the T8, T9 and T10 spinous processes. Changes are worst in T8 were approximately 50% of the posterior aspect of vertebral body is  involved. Cord: Mild edema in the distal cord centered at the T11-12 level is unchanged. Paraspinal and other soft tissues: Paraspinous abscess formation seen on the prior examination has worsened. A collection along the left side of the T11-12 vertebral body which had measured 1.9 cm AP by 0.9 cm transverse today measures 3.1 cm AP x 1.4 cm transverse. This collection is incompletely imaged in the sagittal plane but is approximately 2.3 cm craniocaudal. There is a new phlegmon or early abscess along the right side of the disc interspace measuring 2.2 cm AP x 1.6 cm transverse. Prevertebral edema and enhancement extend from T10-L1. Disc levels: There is flattening of the distal cord at the T11-12 level due to phlegmon and retropulsed bone off the superior endplate of P32. Multilevel degenerative disc disease is unchanged. 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 Paraspinous abscess on the left at this level has increased in size there is new phlegmon or early abscess on the right. Edema in the cord at this level due to compression from retropulsed bone and phlegmon is unchanged. New marrow edema and enhancement in the T2-T7 and T8-T10 vertebral bodies and the spinous processes of T8-T10 is  highly suspicious for osteomyelitis. Given absence of involvement of the intervertebral discs, tuberculosis is a consideration. Electronically Signed   By: Inge Rise M.D.   On: 05/24/2017 13:48    Microbiology: Recent Results (from the past 240 hour(s))  Blood culture (routine x 2)     Status: None (Preliminary result)   Collection Time: 05/24/17  7:35 PM  Result Value Ref Range Status   Specimen Description BLOOD RIGHT ANTECUBITAL  Final   Special Requests IN PEDIATRIC BOTTLE Blood Culture adequate volume  Final   Culture NO GROWTH 3 DAYS  Final   Report Status PENDING  Incomplete  Fungus culture, blood     Status: None (Preliminary  result)   Collection Time: 05/24/17  7:35 PM  Result Value Ref Range Status   Specimen Description BLOOD RIGHT ANTECUBITAL  Final   Special Requests   Final    BOTTLES DRAWN AEROBIC AND ANAEROBIC Blood Culture adequate volume   Culture NO GROWTH 3 DAYS  Final   Report Status PENDING  Incomplete  Blood culture (routine x 2)     Status: None (Preliminary result)   Collection Time: 05/24/17  7:50 PM  Result Value Ref Range Status   Specimen Description BLOOD LEFT ANTECUBITAL  Final   Special Requests   Final    BOTTLES DRAWN AEROBIC AND ANAEROBIC Blood Culture adequate volume   Culture NO GROWTH 3 DAYS  Final   Report Status PENDING  Incomplete  MRSA PCR Screening     Status: None   Collection Time: 05/24/17 10:59 PM  Result Value Ref Range Status   MRSA by PCR NEGATIVE NEGATIVE Final    Comment:        The GeneXpert MRSA Assay (FDA approved for NASAL specimens only), is one component of a comprehensive MRSA colonization surveillance program. It is not intended to diagnose MRSA infection nor to guide or monitor treatment for MRSA infections.      Labs: Basic Metabolic Panel: Recent Labs  Lab 05/24/17 1515 05/25/17 0514  NA 137 135  K 3.0* 3.3*  CL 99* 99*  CO2 28 26  GLUCOSE 146* 104*  BUN 11 10  CREATININE 0.99 0.62  CALCIUM 9.9 8.7*   Liver Function Tests: Recent Labs  Lab 05/24/17 1515 05/25/17 0514  AST 56* 48*  ALT 108* 91*  ALKPHOS 93 79  BILITOT 1.1 0.6  PROT 9.3* 7.2  ALBUMIN 4.0 3.2*   No results for input(s): LIPASE, AMYLASE in the last 168 hours. No results for input(s): AMMONIA in the last 168 hours. CBC: Recent Labs  Lab 05/24/17 1515 05/25/17 0514  WBC 7.5 6.3  NEUTROABS 4.7 2.8  HGB 14.8 12.7*  HCT 42.0 36.7*  MCV 94.2 95.1  PLT 282 216   Cardiac Enzymes: No results for input(s): CKTOTAL, CKMB, CKMBINDEX, TROPONINI in the last 168 hours. BNP: BNP (last 3 results) Recent Labs    02/06/17 1535 02/15/17 2358  BNP 118.8* 74.5     ProBNP (last 3 results) No results for input(s): PROBNP in the last 8760 hours.  CBG: No results for input(s): GLUCAP in the last 168 hours.    Signed:  Velvet Bathe MD.  [facility] 05/28/2017, 11:51 AM

## 2017-05-28 NOTE — Progress Notes (Signed)
Came to Pt's room, Pt ambulating. Educated/advised multiple times that he is on bedrest post-procedure. Pt non-compliant, said that he is aware that he is supposed to be on bedrest, he feels good and wants to talk to MD to go home as soon as possible.

## 2017-05-28 NOTE — Progress Notes (Signed)
Pt went off the unit and came back after a few minutes. Pt understands that he is not suppose to go off the unit without MDs orders. Pt aware of risks of ambulating post-procedure. MD notified.

## 2017-05-29 ENCOUNTER — Ambulatory Visit (INDEPENDENT_AMBULATORY_CARE_PROVIDER_SITE_OTHER): Payer: Self-pay | Admitting: Physician Assistant

## 2017-05-29 LAB — CULTURE, BLOOD (ROUTINE X 2)
CULTURE: NO GROWTH
Culture: NO GROWTH
SPECIAL REQUESTS: ADEQUATE
Special Requests: ADEQUATE

## 2017-05-31 ENCOUNTER — Inpatient Hospital Stay (HOSPITAL_COMMUNITY)
Admission: EM | Admit: 2017-05-31 | Discharge: 2017-06-01 | DRG: 540 | Disposition: A | Discharge status: Home / Self Care | Payer: Self-pay | Attending: Family Medicine | Admitting: Family Medicine

## 2017-05-31 ENCOUNTER — Encounter (HOSPITAL_COMMUNITY): Payer: Self-pay | Admitting: *Deleted

## 2017-05-31 ENCOUNTER — Other Ambulatory Visit: Payer: Self-pay

## 2017-05-31 DIAGNOSIS — M4624 Osteomyelitis of vertebra, thoracic region: Principal | ICD-10-CM | POA: Diagnosis present

## 2017-05-31 DIAGNOSIS — I5030 Unspecified diastolic (congestive) heart failure: Secondary | ICD-10-CM | POA: Diagnosis present

## 2017-05-31 DIAGNOSIS — M4620 Osteomyelitis of vertebra, site unspecified: Secondary | ICD-10-CM | POA: Diagnosis present

## 2017-05-31 DIAGNOSIS — Z765 Malingerer [conscious simulation]: Secondary | ICD-10-CM

## 2017-05-31 DIAGNOSIS — I110 Hypertensive heart disease with heart failure: Secondary | ICD-10-CM | POA: Diagnosis present

## 2017-05-31 DIAGNOSIS — F17210 Nicotine dependence, cigarettes, uncomplicated: Secondary | ICD-10-CM | POA: Diagnosis present

## 2017-05-31 DIAGNOSIS — Z823 Family history of stroke: Secondary | ICD-10-CM

## 2017-05-31 DIAGNOSIS — M546 Pain in thoracic spine: Secondary | ICD-10-CM | POA: Diagnosis present

## 2017-05-31 DIAGNOSIS — Z7289 Other problems related to lifestyle: Secondary | ICD-10-CM

## 2017-05-31 DIAGNOSIS — M4644 Discitis, unspecified, thoracic region: Secondary | ICD-10-CM | POA: Diagnosis present

## 2017-05-31 DIAGNOSIS — B3789 Other sites of candidiasis: Secondary | ICD-10-CM | POA: Diagnosis present

## 2017-05-31 DIAGNOSIS — F1210 Cannabis abuse, uncomplicated: Secondary | ICD-10-CM | POA: Diagnosis present

## 2017-05-31 DIAGNOSIS — F1290 Cannabis use, unspecified, uncomplicated: Secondary | ICD-10-CM | POA: Diagnosis present

## 2017-05-31 DIAGNOSIS — Z79899 Other long term (current) drug therapy: Secondary | ICD-10-CM

## 2017-05-31 DIAGNOSIS — Z880 Allergy status to penicillin: Secondary | ICD-10-CM

## 2017-05-31 DIAGNOSIS — Z8679 Personal history of other diseases of the circulatory system: Secondary | ICD-10-CM

## 2017-05-31 DIAGNOSIS — F1410 Cocaine abuse, uncomplicated: Secondary | ICD-10-CM | POA: Diagnosis present

## 2017-05-31 DIAGNOSIS — Z8249 Family history of ischemic heart disease and other diseases of the circulatory system: Secondary | ICD-10-CM

## 2017-05-31 LAB — CBC WITH DIFFERENTIAL/PLATELET
BASOS ABS: 0 10*3/uL (ref 0.0–0.1)
Basophils Relative: 0 %
EOS PCT: 2 %
Eosinophils Absolute: 0.2 10*3/uL (ref 0.0–0.7)
HEMATOCRIT: 37.6 % — AB (ref 39.0–52.0)
HEMOGLOBIN: 12.7 g/dL — AB (ref 13.0–17.0)
LYMPHS ABS: 2.1 10*3/uL (ref 0.7–4.0)
LYMPHS PCT: 30 %
MCH: 32.6 pg (ref 26.0–34.0)
MCHC: 33.8 g/dL (ref 30.0–36.0)
MCV: 96.7 fL (ref 78.0–100.0)
Monocytes Absolute: 0.6 10*3/uL (ref 0.1–1.0)
Monocytes Relative: 8 %
NEUTROS ABS: 4.1 10*3/uL (ref 1.7–7.7)
NEUTROS PCT: 60 %
PLATELETS: 238 10*3/uL (ref 150–400)
RBC: 3.89 MIL/uL — AB (ref 4.22–5.81)
RDW: 14.7 % (ref 11.5–15.5)
WBC: 6.9 10*3/uL (ref 4.0–10.5)

## 2017-05-31 LAB — COMPREHENSIVE METABOLIC PANEL
ALT: 63 U/L (ref 17–63)
AST: 48 U/L — AB (ref 15–41)
Albumin: 3.8 g/dL (ref 3.5–5.0)
Alkaline Phosphatase: 73 U/L (ref 38–126)
Anion gap: 13 (ref 5–15)
BILIRUBIN TOTAL: 1.4 mg/dL — AB (ref 0.3–1.2)
BUN: 12 mg/dL (ref 6–20)
CHLORIDE: 96 mmol/L — AB (ref 101–111)
CO2: 25 mmol/L (ref 22–32)
Calcium: 9.3 mg/dL (ref 8.9–10.3)
Creatinine, Ser: 0.65 mg/dL (ref 0.61–1.24)
Glucose, Bld: 108 mg/dL — ABNORMAL HIGH (ref 65–99)
POTASSIUM: 3.4 mmol/L — AB (ref 3.5–5.1)
Sodium: 134 mmol/L — ABNORMAL LOW (ref 135–145)
TOTAL PROTEIN: 8.5 g/dL — AB (ref 6.5–8.1)

## 2017-05-31 LAB — CBC
HEMATOCRIT: 39.1 % (ref 39.0–52.0)
HEMOGLOBIN: 13.4 g/dL (ref 13.0–17.0)
MCH: 33.8 pg (ref 26.0–34.0)
MCHC: 34.3 g/dL (ref 30.0–36.0)
MCV: 98.7 fL (ref 78.0–100.0)
Platelets: 269 10*3/uL (ref 150–400)
RBC: 3.96 MIL/uL — ABNORMAL LOW (ref 4.22–5.81)
RDW: 14.8 % (ref 11.5–15.5)
WBC: 6.6 10*3/uL (ref 4.0–10.5)

## 2017-05-31 LAB — FUNGUS CULTURE, BLOOD
CULTURE: NO GROWTH
SPECIAL REQUESTS: ADEQUATE

## 2017-05-31 LAB — CREATININE, SERUM
Creatinine, Ser: 0.82 mg/dL (ref 0.61–1.24)
GFR calc Af Amer: >60 mL/min (ref >60)
GFR calc non Af Amer: >60 mL/min (ref >60)

## 2017-05-31 MED ORDER — MORPHINE SULFATE ER 15 MG PO TBCR
30.0000 mg | EXTENDED_RELEASE_TABLET | Freq: Two times a day (BID) | ORAL | Status: DC
  Administered 2017-05-31 – 2017-06-01 (×3): 30 mg via ORAL
  Filled 2017-05-31 (×3): qty 2

## 2017-05-31 MED ORDER — HEPARIN SODIUM (PORCINE) 5000 UNIT/ML IJ SOLN
5000.0000 [IU] | Freq: Three times a day (TID) | INTRAMUSCULAR | Status: DC
  Administered 2017-05-31 – 2017-06-01 (×3): 5000 [IU] via SUBCUTANEOUS
  Filled 2017-05-31 (×3): qty 1

## 2017-05-31 MED ORDER — AMLODIPINE BESYLATE 10 MG PO TABS
10.0000 mg | ORAL_TABLET | Freq: Every day | ORAL | Status: DC
  Administered 2017-05-31 – 2017-06-01 (×2): 10 mg via ORAL
  Filled 2017-05-31: qty 1
  Filled 2017-05-31: qty 2

## 2017-05-31 MED ORDER — OXYCODONE HCL ER 20 MG PO T12A
20.0000 mg | EXTENDED_RELEASE_TABLET | Freq: Two times a day (BID) | ORAL | Status: DC
  Administered 2017-05-31: 20 mg via ORAL
  Filled 2017-05-31: qty 2

## 2017-05-31 MED ORDER — LISINOPRIL-HYDROCHLOROTHIAZIDE 20-12.5 MG PO TABS: 2.0000 | ORAL_TABLET | Freq: Every day | ORAL | Status: DC

## 2017-05-31 MED ORDER — POTASSIUM CHLORIDE IN NACL 20-0.9 MEQ/L-% IV SOLN
INTRAVENOUS | Status: DC
Start: 2017-05-31 — End: 2017-06-01
  Administered 2017-05-31: 16:00:00 via INTRAVENOUS
  Filled 2017-05-31: qty 1000

## 2017-05-31 MED ORDER — ONDANSETRON HCL 4 MG PO TABS: 4.0000 mg | ORAL_TABLET | Freq: Four times a day (QID) | ORAL | Status: DC | PRN

## 2017-05-31 MED ORDER — LISINOPRIL 40 MG PO TABS
40.0000 mg | ORAL_TABLET | Freq: Every day | ORAL | Status: DC
  Administered 2017-06-01: 40 mg via ORAL
  Filled 2017-05-31: qty 1

## 2017-05-31 MED ORDER — POSACONAZOLE 100 MG PO TBEC
300.0000 mg | DELAYED_RELEASE_TABLET | Freq: Every day | ORAL | Status: DC
  Administered 2017-06-01: 300 mg via ORAL
  Filled 2017-05-31 (×2): qty 3

## 2017-05-31 MED ORDER — ONDANSETRON HCL 4 MG/2ML IJ SOLN: 4.0000 mg | Freq: Four times a day (QID) | INTRAMUSCULAR | Status: DC | PRN

## 2017-05-31 MED ORDER — ACETAMINOPHEN 650 MG RE SUPP: 650.0000 mg | Freq: Four times a day (QID) | RECTAL | Status: DC | PRN

## 2017-05-31 MED ORDER — HYDROMORPHONE HCL 1 MG/ML IJ SOLN
1.0000 mg | Freq: Once | INTRAMUSCULAR | Status: AC
  Administered 2017-05-31: 1 mg via INTRAVENOUS
  Filled 2017-05-31: qty 1

## 2017-05-31 MED ORDER — CARVEDILOL 6.25 MG PO TABS
6.2500 mg | ORAL_TABLET | Freq: Two times a day (BID) | ORAL | Status: DC
  Administered 2017-05-31 – 2017-06-01 (×2): 6.25 mg via ORAL
  Filled 2017-05-31: qty 1

## 2017-05-31 MED ORDER — ACETAMINOPHEN 325 MG PO TABS
650.0000 mg | ORAL_TABLET | Freq: Four times a day (QID) | ORAL | Status: DC | PRN
  Administered 2017-06-01: 650 mg via ORAL
  Filled 2017-05-31: qty 2

## 2017-05-31 MED ORDER — SENNA 8.6 MG PO TABS
1.0000 | ORAL_TABLET | Freq: Two times a day (BID) | ORAL | Status: DC
Start: 2017-05-31 — End: 2017-06-01
  Administered 2017-05-31 – 2017-06-01 (×2): 8.6 mg via ORAL
  Filled 2017-05-31 (×2): qty 1

## 2017-05-31 MED ORDER — MAGNESIUM CITRATE PO SOLN: 1.0000 | Freq: Once | ORAL | Status: DC | PRN

## 2017-05-31 MED ORDER — HYDROCHLOROTHIAZIDE 25 MG PO TABS
25.0000 mg | ORAL_TABLET | Freq: Every day | ORAL | Status: DC
  Administered 2017-06-01: 25 mg via ORAL
  Filled 2017-05-31: qty 1

## 2017-05-31 MED ORDER — POLYETHYLENE GLYCOL 3350 17 G PO PACK
17.0000 g | PACK | Freq: Every day | ORAL | Status: DC
  Administered 2017-05-31 – 2017-06-01 (×2): 17 g via ORAL
  Filled 2017-05-31 (×2): qty 1

## 2017-05-31 MED ORDER — OXYCODONE HCL 5 MG PO TABS: 15.0000 mg | ORAL_TABLET | Freq: Four times a day (QID) | ORAL | Status: DC | PRN

## 2017-05-31 MED ORDER — MORPHINE SULFATE 15 MG PO TABS
15.0000 mg | ORAL_TABLET | ORAL | Status: DC | PRN
  Administered 2017-05-31 – 2017-06-01 (×6): 15 mg via ORAL
  Filled 2017-05-31 (×6): qty 1

## 2017-05-31 NOTE — ED Notes (Signed)
Admitting at bedside 

## 2017-05-31 NOTE — ED Notes (Signed)
Attempted report 
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
## 2017-05-31 NOTE — ED Notes (Signed)
MD states okay for patient to eat/drink. Provided [place] sandwich and ginger ale.

## 2017-05-31 NOTE — ED Triage Notes (Signed)
Pt in by EMS c/o back pain. Recently admitted for vertebral myelitis and discharged 3 days ago. Pt reports at discharge, pain was nearly gone and pt was able to ambulate without difficulty. Has been taking oxycodone 15mg  at home without relief.

## 2017-06-01 DIAGNOSIS — F1410 Cocaine abuse, uncomplicated: Secondary | ICD-10-CM

## 2017-06-01 DIAGNOSIS — G8929 Other chronic pain: Secondary | ICD-10-CM

## 2017-06-01 DIAGNOSIS — M546 Pain in thoracic spine: Secondary | ICD-10-CM

## 2017-06-01 DIAGNOSIS — M4620 Osteomyelitis of vertebra, site unspecified: Secondary | ICD-10-CM

## 2017-06-01 LAB — COMPREHENSIVE METABOLIC PANEL
ALT: 56 U/L (ref 17–63)
AST: 39 U/L (ref 15–41)
Albumin: 3.3 g/dL — ABNORMAL LOW (ref 3.5–5.0)
Alkaline Phosphatase: 70 U/L (ref 38–126)
Anion gap: 8 (ref 5–15)
BILIRUBIN TOTAL: 0.9 mg/dL (ref 0.3–1.2)
BUN: 10 mg/dL (ref 6–20)
CO2: 29 mmol/L (ref 22–32)
Calcium: 8.6 mg/dL — ABNORMAL LOW (ref 8.9–10.3)
Chloride: 96 mmol/L — ABNORMAL LOW (ref 101–111)
Creatinine, Ser: 0.76 mg/dL (ref 0.61–1.24)
GFR calc Af Amer: >60 mL/min (ref >60)
Glucose, Bld: 112 mg/dL — ABNORMAL HIGH (ref 65–99)
POTASSIUM: 3.6 mmol/L (ref 3.5–5.1)
Sodium: 133 mmol/L — ABNORMAL LOW (ref 135–145)
TOTAL PROTEIN: 7.7 g/dL (ref 6.5–8.1)

## 2017-06-01 LAB — APTT: aPTT: 30 seconds (ref 24–36)

## 2017-06-01 LAB — PROTIME-INR
INR: 1.02
PROTHROMBIN TIME: 13.3 s (ref 11.4–15.2)

## 2017-06-01 MED ORDER — OXYCODONE HCL 20 MG PO TABS: 20.0000 mg | ORAL_TABLET | Freq: Four times a day (QID) | ORAL | 0 refills | Status: DC | PRN

## 2017-06-01 NOTE — Discharge Summary (Signed)
Physician Discharge Summary  [name]  MVH:[id number]  DOB: [birth year]  DOA: 05/31/2017 PCP: Clent Demark, PA-C  Admit date: 05/31/2017 Discharge date: 06/01/2017  Admitted From: Home  Disposition: Home   Recommendations for Outpatient Follow-up:  1. Follow up with PCP in 1-2 weeks 2. Please obtain BMP/CBC in one week 3. Outpatient PT  4. Neurosurgery follow up in 1-2 weeks   Discharge Condition: Stable  CODE STATUS: Full  Diet recommendation: Heart Healthy   Brief/Interim Summary: HPI per Dr [name]:  [name] is a 52 y.o. 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  The patient has had regular follow-up with his primary care physician and had a follow-up MRI showed worsening discitis and paraspinal abscess as well as marrow edema in the T2-T7 range and T8-T10 range.  Patient was admitted to our facility January 1 - January 8 for treatment of this and was discharged home on oral antibiotics.  He has been taking his medications.  He underwent biopsy and was due to follow-up with infectious diseases for biopsy results and adjustments in medications if needed.  Patient significantly improved during his hospitalization and was discharged home in good condition.  He returns today complaining of significant back pain and difficulty walking with weakness.  Patient states that he has been progressively worsening since discharge.   Neurosurgery was consulted whom recommended pain control and no need to repeat new images. Patient was admitted for pain control. Pain significantly improved, he was evaluated by PT and recommended outpatient PT. Patient was ambulating with no pain. Patient was prescribed pain medication for 5 day and advised to follow up with PCP.    Subjective: Patient seen and examined, pain has resolved. No acute  events overnight   Discharge Diagnoses/Hospital Course:  Thoracic back pain  Due to vertebral osteomyelitis from Candida Albicans  No neurological abnormalities Neurosurgery was consulted and recommended pain control and follow up as outpatient  No need for repeat MRI at this time  Avoid IV narcotic, drug seeking behavior.  Follow up with PCP   Vertebral osteomyelitis - Chronic  No evidence of infectious process going on  Last LP - cultures no growth  Continue Posaconazole daily  Follow up with ID as indicated on last discharge    Polysubstance abuse  Counseled on cessation   All other chronic medical condition were stable during the hospitalization.  Patient was seen by physical therapy, recommended outpatient PT  On the day of the discharge the patient's vitals were stable, and no other acute medical condition were reported by patient. the patient was felt safe to be discharge to home   Discharge Instructions  You were cared for by a hospitalist during your hospital stay. If you have any questions about your discharge medications or the care you received while you were in the hospital after you are discharged, you can call the unit and asked to speak with the hospitalist on call if the hospitalist that took care of you is not available. Once you are discharged, your primary care physician will handle any further medical issues. Please note that NO REFILLS for any discharge medications will be authorized once you are discharged, as it is imperative that you return to your primary care physician (or establish a relationship with a primary care physician if you do not have one) for your aftercare needs so that  they can reassess your need for medications and monitor your lab values.  Discharge Instructions    Call MD for:  difficulty breathing, headache or visual disturbances   Complete by:  As directed    Call MD for:  extreme fatigue   Complete by:  As directed    Call MD for:  hives    Complete by:  As directed    Call MD for:  persistant dizziness or light-headedness   Complete by:  As directed    Call MD for:  persistant nausea and vomiting   Complete by:  As directed    Call MD for:  redness, tenderness, or signs of infection (pain, swelling, redness, odor or green/yellow discharge around incision site)   Complete by:  As directed    Call MD for:  severe uncontrolled pain   Complete by:  As directed    Call MD for:  temperature >100.4   Complete by:  As directed    Diet - low sodium heart healthy   Complete by:  As directed    Increase activity slowly   Complete by:  As directed      Allergies as of 06/01/2017      Reactions   Penicillins    From childhood: Has patient had a PCN reaction causing immediate rash, facial/tongue/throat swelling, SOB or lightheadedness with hypotension: Unknown Has patient had a PCN reaction causing severe rash involving mucus membranes or skin necrosis: Unknown Has patient had a PCN reaction that required hospitalization: Unknown Has patient had a PCN reaction occurring within the last 10 years: No If all of the above answers are "NO", then may proceed with Cephalosporin use.      Medication List    TAKE these medications   amLODipine 10 MG tablet Commonly known as:  NORVASC Take 1 tablet (10 mg total) by mouth daily.   carvedilol 6.25 MG tablet Commonly known as:  COREG Take 1 tablet (6.25 mg total) by mouth 2 (two) times daily with a meal.   ENSURE Take 237 mLs by mouth daily.   lisinopril-hydrochlorothiazide 20-12.5 MG tablet Commonly known as:  ZESTORETIC Take 2 tablets by mouth daily.   OVER THE COUNTER MEDICATION Take 1 tablet by mouth daily.   Oxycodone HCl 20 MG Tabs Take 1 tablet (20 mg total) by mouth every 6 (six) hours as needed for up to 6 days. What changed:    medication strength  how much to take  reasons to take this   polyethylene glycol packet Commonly known as:  MIRALAX /  GLYCOLAX Take 17 g by mouth daily. What changed:    when to take this  reasons to take this   posaconazole 100 MG Tbec delayed-release tablet Commonly known as:  NOXAFIL Take 3 tablets (300 mg total) daily by mouth.      Follow-up Information    Clent Demark, PA-C. Schedule an appointment as soon as possible for a visit in 1 week(s).   Specialty:  Physician Assistant Why:  Hospital follow up. Patient has an appointment on 06/04/17. Please do not miss your appointment  Contact information: [place] [id number] [phone]          Allergies  Allergen Reactions  . Penicillins     From childhood: Has patient had a PCN reaction causing immediate rash, facial/tongue/throat swelling, SOB or lightheadedness with hypotension: Unknown Has patient had a PCN reaction causing severe rash involving mucus membranes or skin necrosis: Unknown Has  patient had a PCN reaction that required hospitalization: Unknown Has patient had a PCN reaction occurring within the last 10 years: No If all of the above answers are "NO", then may proceed with Cephalosporin use.     Consultations:  Neruosurgery   Procedures/Studies: Mr Thoracic Spine W Wo Contrast  Result Date: 05/24/2017 CLINICAL DATA:  Patient with a history of polysubstance abuse, endocarditis and discitis and osteomyelitis at T11-12. The patient was discharged from the hospital 03/08/2017 after treatment for diagnosis of candidal discitis. EXAM: MRI THORACIC WITHOUT AND WITH CONTRAST TECHNIQUE: Multiplanar and multiecho pulse sequences of the thoracic spine were obtained without and with intravenous contrast. CONTRAST:  20 ml MULTIHANCE GADOBENATE DIMEGLUMINE 529 MG/ML IV SOLN COMPARISON:  MRI thoracic spine 03/13/2017. FINDINGS: MRI THORACIC SPINE FINDINGS Alignment: There has been some increase in kyphosis about the T11-12 level due to progressive endplate destructive change as described below. Vertebrae:  There is persistent intense edema and enhancement throughout the T11 and T12 vertebral bodies with fluid in the disc interspace. The facet joints are also edematous and enhancing consistent with septic facet arthritis. There is new edema and postcontrast enhancement in the posterior aspects of the T2-T6 and T8-T10 vertebral bodies and in the T8, T9 and T10 spinous processes. Changes are worst in T8 were approximately 50% of the posterior aspect of vertebral body is involved. Cord: Mild edema in the distal cord centered at the T11-12 level is unchanged. Paraspinal and other soft tissues: Paraspinous abscess formation seen on the prior examination has worsened. A collection along the left side of the T11-12 vertebral body which had measured 1.9 cm AP by 0.9 cm transverse today measures 3.1 cm AP x 1.4 cm transverse. This collection is incompletely imaged in the sagittal plane but is approximately 2.3 cm craniocaudal. There is a new phlegmon or early abscess along the right side of the disc interspace measuring 2.2 cm AP x 1.6 cm transverse. Prevertebral edema and enhancement extend from T10-L1. Disc levels: There is flattening of the distal cord at the T11-12 level due to phlegmon and retropulsed bone off the superior endplate of O29. Multilevel degenerative disc disease is unchanged. 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 Paraspinous abscess on the left at this level has increased in size there is new phlegmon or early abscess on the right. Edema in the cord at this level due to compression from retropulsed bone and phlegmon is unchanged. New marrow edema and enhancement in the T2-T7 and T8-T10 vertebral bodies and the spinous processes of T8-T10 is highly suspicious for osteomyelitis. Given absence of involvement of the intervertebral discs, tuberculosis is a consideration. Electronically Signed   By: Inge Rise M.D.   On: 05/24/2017  13:48   Ir Fluoro Guide Ndl Plmt / Bx  Result Date: 05/31/2017 CLINICAL DATA:  Known T11-T12 discitis with osteomyelitis. EXAM: IR FLUORO GUIDE NEEDLE PLACEMENT/BIOPSY COMPARISON:  MRI of the thoracic spine of 05/24/2017. MEDICATIONS: Heparin 0 units IV; no antibiotic was administered within 1 hour of the procedure. ANESTHESIA/SEDATION: Versed 1 mg IV; Fentanyl 25 mcg IV.  Dilaudid 1 mg IV. Moderate Sedation Time:  13 minutes. The patient was continuously monitored during the procedure by the interventional radiology nurse under my direct supervision. CONTRAST:  None. FLUOROSCOPY TIME:  Fluoroscopy Time: 2 minutes 54 seconds (0 mGy). COMPLICATIONS: None immediate. TECHNIQUE: Informed written consent was obtained from the patient after a thorough discussion of the procedural risks, benefits and alternatives. All  questions were addressed. Maximal Sterile Barrier Technique was utilized including caps, mask, sterile gowns, sterile gloves, sterile drape, hand hygiene and skin antiseptic. A timeout was performed prior to the initiation of the procedure. With the patient prone, the skin in the thoracolumbar region was then prepped and draped in the usual sterile fashion. The T11-T12 disc space was then identified. FINDINGS: The skin overlying the skin entry site at T11-T12 was then infiltrated with 0.25 % bupivacaine. Thereafter using biplane intermittent fluoroscopy, and the right paraspinous approach, a 21 gauge Franseen needle was advanced into the T11-T12 disc space without difficulty. Using a 20 mL syringe, approximately 8 mL of thick bloody aspirate was obtained and sent for microbiologic analysis as per request. Hemostasis was achieved at the skin entry site. The patient tolerated the procedure well. IMPRESSION: Status post fluoroscopic guided needle placement at T11-T12 for disc aspiration. PLAN: As per primary physician. Electronically Signed   By: Luanne Bras M.D.   On: 05/28/2017 10:49    (Echo,  Carotid, EGD, Colonoscopy, ERCP)    Discharge Exam: Vitals:   05/31/17 1932 06/01/17 0508  BP: 107/70 100/67  Pulse: 90 60  Resp: 16 16  Temp: 97.9 F (36.6 C) 98 F (36.7 C)  SpO2: 98% 96%   Vitals:   05/31/17 1419 05/31/17 1500 05/31/17 1932 06/01/17 0508  BP: 106/60 104/60 107/70 100/67  Pulse: 65 68 90 60  Resp: 18 18 16 16   Temp:  97.8 F (36.6 C) 97.9 F (36.6 C) 98 F (36.7 C)  TempSrc:  Oral Oral Oral  SpO2: 100% 100% 98% 96%    General: Pt is alert, awake, not in acute distress Cardiovascular: RRR, S1/S2 +, no rubs, no gallops Respiratory: CTA bilaterally, no wheezing, no rhonchi Abdominal: Soft, NT, ND, bowel sounds + Extremities: no edema, no cyanosis Neuro: AAOx3, no focal findings    The results of significant diagnostics from this hospitalization (including imaging, microbiology, ancillary and laboratory) are listed below for reference.     Microbiology: Recent Results (from the past 240 hour(s))  Blood culture (routine x 2)     Status: None   Collection Time: 05/24/17  7:35 PM  Result Value Ref Range Status   Specimen Description BLOOD RIGHT ANTECUBITAL  Final   Special Requests IN PEDIATRIC BOTTLE Blood Culture adequate volume  Final   Culture NO GROWTH 5 DAYS  Final   Report Status 05/29/2017 FINAL  Final  Fungus culture, blood     Status: None   Collection Time: 05/24/17  7:35 PM  Result Value Ref Range Status   Specimen Description BLOOD RIGHT ANTECUBITAL  Final   Special Requests   Final    BOTTLES DRAWN AEROBIC AND ANAEROBIC Blood Culture adequate volume   Culture NO GROWTH 7 DAYS NO FUNGUS ISOLATED  Final   Report Status 05/31/2017 FINAL  Final  Blood culture (routine x 2)     Status: None   Collection Time: 05/24/17  7:50 PM  Result Value Ref Range Status   Specimen Description BLOOD LEFT ANTECUBITAL  Final   Special Requests   Final    BOTTLES DRAWN AEROBIC AND ANAEROBIC Blood Culture adequate volume   Culture NO GROWTH 5 DAYS   Final   Report Status 05/29/2017 FINAL  Final  MRSA PCR Screening     Status: None   Collection Time: 05/24/17 10:59 PM  Result Value Ref Range Status   MRSA by PCR NEGATIVE NEGATIVE Final    Comment:  The GeneXpert MRSA Assay (FDA approved for NASAL specimens only), is one component of a comprehensive MRSA colonization surveillance program. It is not intended to diagnose MRSA infection nor to guide or monitor treatment for MRSA infections.   Aerobic/Anaerobic Culture (surgical/deep wound)     Status: None (Preliminary result)   Collection Time: 05/28/17 10:01 AM  Result Value Ref Range Status   Specimen Description WOUND BACK DISC SPACE  Final   Special Requests NONE  Final   Gram Stain   Final    RARE WBC PRESENT,BOTH PMN AND MONONUCLEAR NO ORGANISMS SEEN    Culture   Final    NO GROWTH 4 DAYS NO ANAEROBES ISOLATED; CULTURE IN PROGRESS FOR 5 DAYS   Report Status PENDING  Incomplete     Labs: BNP (last 3 results) Recent Labs    02/06/17 1535 02/15/17 2358  BNP 118.8* 31.5   Basic Metabolic Panel: Recent Labs  Lab 05/31/17 0155 05/31/17 1443 06/01/17 0517  NA 134*  --  133*  K 3.4*  --  3.6  CL 96*  --  96*  CO2 25  --  29  GLUCOSE 108*  --  112*  BUN 12  --  10  CREATININE 0.65 0.82 0.76  CALCIUM 9.3  --  8.6*   Liver Function Tests: Recent Labs  Lab 05/31/17 0155 06/01/17 0517  AST 48* 39  ALT 63 56  ALKPHOS 73 70  BILITOT 1.4* 0.9  PROT 8.5* 7.7  ALBUMIN 3.8 3.3*   No results for input(s): LIPASE, AMYLASE in the last 168 hours. No results for input(s): AMMONIA in the last 168 hours. CBC: Recent Labs  Lab 05/31/17 0155 05/31/17 1443  WBC 6.9 6.6  NEUTROABS 4.1  --   HGB 12.7* 13.4  HCT 37.6* 39.1  MCV 96.7 98.7  PLT 238 269   Cardiac Enzymes: No results for input(s): CKTOTAL, CKMB, CKMBINDEX, TROPONINI in the last 168 hours. BNP: Invalid input(s): POCBNP CBG: No results for input(s): GLUCAP in the last 168  hours. D-Dimer No results for input(s): DDIMER in the last 72 hours. Hgb A1c No results for input(s): HGBA1C in the last 72 hours. Lipid Profile No results for input(s): CHOL, HDL, LDLCALC, TRIG, CHOLHDL, LDLDIRECT in the last 72 hours. Thyroid function studies No results for input(s): TSH, T4TOTAL, T3FREE, THYROIDAB in the last 72 hours.  Invalid input(s): FREET3 Anemia work up No results for input(s): VITAMINB12, FOLATE, FERRITIN, TIBC, IRON, RETICCTPCT in the last 72 hours. Urinalysis    Component Value Date/Time   COLORURINE YELLOW 05/25/2017 0022   APPEARANCEUR HAZY (A) 05/25/2017 0022   LABSPEC 1.026 05/25/2017 0022   PHURINE 5.0 05/25/2017 0022   GLUCOSEU NEGATIVE 05/25/2017 0022   HGBUR NEGATIVE 05/25/2017 0022   BILIRUBINUR NEGATIVE 05/25/2017 0022   KETONESUR NEGATIVE 05/25/2017 0022   PROTEINUR NEGATIVE 05/25/2017 0022   UROBILINOGEN 1.0 10/16/2010 0217   NITRITE NEGATIVE 05/25/2017 0022   LEUKOCYTESUR NEGATIVE 05/25/2017 0022   Sepsis Labs Invalid input(s): PROCALCITONIN,  WBC,  LACTICIDVEN Microbiology Recent Results (from the past 240 hour(s))  Blood culture (routine x 2)     Status: None   Collection Time: 05/24/17  7:35 PM  Result Value Ref Range Status   Specimen Description BLOOD RIGHT ANTECUBITAL  Final   Special Requests IN PEDIATRIC BOTTLE Blood Culture adequate volume  Final   Culture NO GROWTH 5 DAYS  Final   Report Status 05/29/2017 FINAL  Final  Fungus culture, blood     Status:  None   Collection Time: 05/24/17  7:35 PM  Result Value Ref Range Status   Specimen Description BLOOD RIGHT ANTECUBITAL  Final   Special Requests   Final    BOTTLES DRAWN AEROBIC AND ANAEROBIC Blood Culture adequate volume   Culture NO GROWTH 7 DAYS NO FUNGUS ISOLATED  Final   Report Status 05/31/2017 FINAL  Final  Blood culture (routine x 2)     Status: None   Collection Time: 05/24/17  7:50 PM  Result Value Ref Range Status   Specimen Description BLOOD LEFT  ANTECUBITAL  Final   Special Requests   Final    BOTTLES DRAWN AEROBIC AND ANAEROBIC Blood Culture adequate volume   Culture NO GROWTH 5 DAYS  Final   Report Status 05/29/2017 FINAL  Final  MRSA PCR Screening     Status: None   Collection Time: 05/24/17 10:59 PM  Result Value Ref Range Status   MRSA by PCR NEGATIVE NEGATIVE Final    Comment:        The GeneXpert MRSA Assay (FDA approved for NASAL specimens only), is one component of a comprehensive MRSA colonization surveillance program. It is not intended to diagnose MRSA infection nor to guide or monitor treatment for MRSA infections.   Aerobic/Anaerobic Culture (surgical/deep wound)     Status: None (Preliminary result)   Collection Time: 05/28/17 10:01 AM  Result Value Ref Range Status   Specimen Description WOUND BACK DISC SPACE  Final   Special Requests NONE  Final   Gram Stain   Final    RARE WBC PRESENT,BOTH PMN AND MONONUCLEAR NO ORGANISMS SEEN    Culture   Final    NO GROWTH 4 DAYS NO ANAEROBES ISOLATED; CULTURE IN PROGRESS FOR 5 DAYS   Report Status PENDING  Incomplete    Time coordinating discharge:  32 minutes  SIGNED:  Chipper Oman, MD  [facility] 06/01/2017, 3:13 PM  Pager please text page via  [email or web address]

## 2017-06-01 NOTE — Progress Notes (Signed)
[facility]   Patient was discharged with prescription for Oxycodone 20 mg for 5 days supply. After reviewing PMP Aware noted patient filled on 05/28/17 Oxy 15 mg 40 pill for a ten day supply.  [facility] called me to discuss prescription. It seems that patient is drug shopping, he has used different pharmacies with multiple provider prescribing narcotics. Patient should have enough medication to last until his appointment on 06/04/17, even more that he spent 2 days in the hospital. Therefore prescription has been cancelled, discussed with Christy at [facility].   Chipper Oman, MD

## 2017-06-01 NOTE — Progress Notes (Signed)
PT Cancellation Note  Patient Details Name: [name] MRN: [id number] DOB: [birth year]   Cancelled Treatment:    Reason Eval/Treat Not Completed: Other (comment) attempted to evaluate patient at 28. Patient is not in his room, per RN he frequently leaves the unit unsupervised to go smoke outside. Will re-attempt this morning  Reinaldo Berber, PT, DPT Acute Rehab Services Pager: [phone]     Reinaldo Berber 06/01/2017, 9:55 AM

## 2017-06-01 NOTE — Evaluation (Signed)
Physical Therapy Evaluation and Discharge Patient Details Name: [name] MRN: [id number] DOB: [birth year] Today's Date: 06/01/2017   History of Present Illness  [name] is a 52 y.o. male who presents to ER 1/11 due to worsening back pain. 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 Most recent admission 1/4 - 05/28/2017. PMH includes: COPD, CHF, Chronic low back pain, rotator cuff repair surgery.     Clinical Impression  Patient evaluated by Physical Therapy with no further acute PT needs identified. All education has been completed and the patient has no further questions. Pt is mod I with all mobility at this time. Discussed with patient that OP therapy could benefit pain and stability and to follow up after discharge. Patient plans to return to his friends house with PRN support available, feel this is safe given presentation and current functional mobility. See below for any follow-up Physical Therapy or equipment needs. PT is signing off. Thank you for this referral.       Follow Up Recommendations Outpatient PT     Equipment Recommendations  Cane    Recommendations for Other Services       Precautions / Restrictions Precautions Precautions: None Restrictions Weight Bearing Restrictions: No      Mobility  Bed Mobility Overal bed mobility: Modified Independent                Transfers Overall transfer level: Modified independent                  Ambulation/Gait Ambulation/Gait assistance: Modified independent (Device/Increase time) Ambulation Distance (Feet): 505 Feet Assistive device: None Gait Pattern/deviations: Step-through pattern;Antalgic Gait velocity: decreased   General Gait Details: Patient ambulates without physical assistance, with antalagic step through pattern. Mild path deviation with dual tasking and instability noted, recoemened  patien try SPC to add stability which he agrees would be benefical. ulimately able to ambulate long distances without rest breaks or LOB.  Stairs            Wheelchair Mobility    Modified Rankin (Stroke Patients Only)       Balance Overall balance assessment: Needs assistance Sitting-balance support: Feet unsupported;No upper extremity supported Sitting balance-Leahy Scale: Normal     Standing balance support: During functional activity;No upper extremity supported Standing balance-Leahy Scale: Good                               Pertinent Vitals/Pain Pain Assessment: 0-10 Pain Score: 8  Pain Location: back and hips Pain Descriptors / Indicators: Discomfort;Grimacing Pain Intervention(s): Limited activity within patient's tolerance;Monitored during session;Patient requesting pain meds-RN notified    Home Living Family/patient expects to be discharged to:: Private residence Living Arrangements: (friend and his mother) Available Help at Discharge: Available PRN/intermittently;Friend(s) Type of Home: House Home Access: Level entry     Home Layout: One level Home Equipment: None      Prior Function Level of Independence: Independent               Hand Dominance        Extremity/Trunk Assessment   Upper Extremity Assessment Upper Extremity Assessment: Defer to OT evaluation    Lower Extremity Assessment Lower Extremity Assessment: Overall WFL for tasks assessed(WNL light touch BLE, strength 4/5 BLE gross. )    Cervical / Trunk Assessment Cervical / Trunk Assessment: Normal  Communication   Communication: No difficulties  Cognition Arousal/Alertness: Awake/alert Behavior During Therapy: WFL for tasks assessed/performed Overall Cognitive Status: Within Functional Limits for tasks assessed                                        General Comments      Exercises     Assessment/Plan    PT Assessment All further PT  needs can be met in the next venue of care  PT Problem List Decreased strength;Decreased range of motion;Decreased activity tolerance;Decreased balance;Decreased knowledge of use of DME;Decreased safety awareness;Pain;Decreased mobility       PT Treatment Interventions      PT Goals (Current goals can be found in the Care Plan section)  Acute Rehab PT Goals Patient Stated Goal: go to stay with friends and decrease pain PT Goal Formulation: With patient Time For Goal Achievement: 06/08/17 Potential to Achieve Goals: Good    Frequency     Barriers to discharge        Co-evaluation               AM-PAC PT "6 Clicks" Daily Activity  Outcome Measure Difficulty turning over in bed (including adjusting bedclothes, sheets and blankets)?: A Little Difficulty moving from lying on back to sitting on the side of the bed? : A Little Difficulty sitting down on and standing up from a chair with arms (e.g., wheelchair, bedside commode, etc,.)?: A Little Help needed moving to and from a bed to chair (including a wheelchair)?: None Help needed walking in hospital room?: None Help needed climbing 3-5 steps with a railing? : None 6 Click Score: 21    End of Session Equipment Utilized During Treatment: Gait belt Activity Tolerance: Patient tolerated treatment well Patient left: in chair;with call bell/phone within reach Nurse Communication: Mobility status PT Visit Diagnosis: Unsteadiness on feet (R26.81);Other abnormalities of gait and mobility (R26.89);Pain Pain - part of body: (Back and BL HIPs)    Time: 1035-1100 PT Time Calculation (min) (ACUTE ONLY): 25 min   Charges:   PT Evaluation $PT Eval Low Complexity: 1 Low PT Treatments $Gait Training: 8-22 mins   PT G Codes:        Reinaldo Berber, PT, DPT Acute Rehab Services Pager: [phone]    Reinaldo Berber 06/01/2017, 11:11 AM

## 2017-06-01 NOTE — Care Management (Signed)
Patient has no Home Health needs, will need taxi at discharge. Going to [facility] [address], [place], [place] [address]. CM contacted Education officer, museum for cab voucher.

## 2017-06-01 NOTE — Plan of Care (Signed)
  Progressing Education: Knowledge of General Education information will improve 06/01/2017 1200 - Progressing by Rance Muir, RN Health Behavior/Discharge Planning: Ability to manage health-related needs will improve 06/01/2017 1200 - Progressing by Rance Muir, RN Clinical Measurements: Ability to maintain clinical measurements within normal limits will improve 06/01/2017 1200 - Progressing by Rance Muir, RN Will remain free from infection 06/01/2017 1200 - Progressing by Rance Muir, RN Diagnostic test results will improve 06/01/2017 1200 - Progressing by Rance Muir, RN Respiratory complications will improve 06/01/2017 1200 - Progressing by Rance Muir, RN Cardiovascular complication will be avoided 06/01/2017 1200 - Progressing by Rance Muir, RN Activity: Risk for activity intolerance will decrease 06/01/2017 1200 - Progressing by Rance Muir, RN Nutrition: Adequate nutrition will be maintained 06/01/2017 1200 - Progressing by Rance Muir, RN Coping: Level of anxiety will decrease 06/01/2017 1200 - Progressing by Rance Muir, RN Elimination: Will not experience complications related to bowel motility 06/01/2017 1200 - Progressing by Rance Muir, RN Will not experience complications related to urinary retention 06/01/2017 1200 - Progressing by Rance Muir, RN Pain Managment: General experience of comfort will improve 06/01/2017 1200 - Progressing by Rance Muir, RN Safety: Ability to remain free from injury will improve 06/01/2017 1200 - Progressing by Rance Muir, RN Skin Integrity: Risk for impaired skin integrity will decrease 06/01/2017 1200 - Progressing by Rance Muir, RN

## 2017-06-01 NOTE — Evaluation (Signed)
Occupational Therapy Evaluation and Discharge Patient Details Name: [name] MRN: [id number] DOB: [birth year] Today's Date: 06/01/2017    History of Present Illness [name] is a 52 y.o. male who presents to ER 1/11 due to worsening back pain. 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 Most recent admission 1/4 - 05/28/2017. PMH includes: COPD, CHF, Chronic low back pain, rotator cuff repair surgery.    Clinical Impression   Pt is functioning independent in basic ADL. No OT needs.    Follow Up Recommendations  No OT follow up    Equipment Recommendations  None recommended by OT    Recommendations for Other Services       Precautions / Restrictions Precautions Precautions: None Restrictions Weight Bearing Restrictions: No      Mobility Bed Mobility Overal bed mobility: Modified Independent             General bed mobility comments: HOB up  Transfers Overall transfer level: Independent Equipment used: None                  Balance Overall balance assessment: Independent Sitting-balance support: Feet unsupported;No upper extremity supported Sitting balance-Leahy Scale: Normal     Standing balance support: During functional activity;No upper extremity supported Standing balance-Leahy Scale: Good                             ADL either performed or assessed with clinical judgement   ADL Overall ADL's : Independent                                             Vision         Perception     Praxis      Pertinent Vitals/Pain Pain Assessment: Faces Pain Score: 8  Faces Pain Scale: Hurts little more Pain Location: back  Pain Descriptors / Indicators: Discomfort;Grimacing Pain Intervention(s): Monitored during session     Hand Dominance Right   Extremity/Trunk Assessment Upper Extremity Assessment Upper  Extremity Assessment: Overall WFL for tasks assessed   Lower Extremity Assessment Lower Extremity Assessment: Overall WFL for tasks assessed   Cervical / Trunk Assessment Cervical / Trunk Assessment: Normal   Communication Communication Communication: No difficulties   Cognition Arousal/Alertness: Awake/alert Behavior During Therapy: WFL for tasks assessed/performed Overall Cognitive Status: Within Functional Limits for tasks assessed                                     General Comments       Exercises     Shoulder Instructions      Home Living Family/patient expects to be discharged to:: Private residence Living Arrangements: Other (Comment)(ex girlfriend) Available Help at Discharge: Available PRN/intermittently;Friend(s) Type of Home: House Home Access: Level entry     Home Layout: One level     Bathroom Shower/Tub: Teacher, early years/pre: Standard Bathroom Accessibility: Yes   Home Equipment: None          Prior Functioning/Environment Level of Independence: Independent                 OT Problem List:        OT Treatment/Interventions:  OT Goals(Current goals can be found in the care plan section) Acute Rehab OT Goals Patient Stated Goal: go to stay with friends and decrease pain  OT Frequency:     Barriers to D/C:            Co-evaluation              AM-PAC PT "6 Clicks" Daily Activity     Outcome Measure Help from another person eating meals?: None Help from another person taking care of personal grooming?: None Help from another person toileting, which includes using toliet, bedpan, or urinal?: None Help from another person bathing (including washing, rinsing, drying)?: None Help from another person to put on and taking off regular upper body clothing?: None Help from another person to put on and taking off regular lower body clothing?: None 6 Click Score: 24   End of Session    Activity  Tolerance: Patient tolerated treatment well Patient left: Other (comment)(walking about his room)  OT Visit Diagnosis: Pain                Time: 1210-1221 OT Time Calculation (min): 11 min Charges:  OT General Charges $OT Visit: 1 Visit OT Evaluation $OT Eval Low Complexity: 1 Low G-Codes:     26-Jun-2017 Nestor Lewandowsky, OTR/L Pager: [phone]  Werner Lean, Haze Boyden 06/26/2017, 12:21 PM

## 2017-06-02 LAB — AEROBIC/ANAEROBIC CULTURE W GRAM STAIN (SURGICAL/DEEP WOUND)

## 2017-06-02 LAB — AEROBIC/ANAEROBIC CULTURE (SURGICAL/DEEP WOUND): CULTURE: NO GROWTH

## 2017-06-03 ENCOUNTER — Ambulatory Visit: Payer: Self-pay | Admitting: Infectious Disease

## 2017-06-03 ENCOUNTER — Ambulatory Visit (INDEPENDENT_AMBULATORY_CARE_PROVIDER_SITE_OTHER): Payer: Self-pay | Admitting: Physician Assistant

## 2017-06-03 ENCOUNTER — Encounter (INDEPENDENT_AMBULATORY_CARE_PROVIDER_SITE_OTHER): Payer: Self-pay | Admitting: Physician Assistant

## 2017-06-03 ENCOUNTER — Other Ambulatory Visit: Payer: Self-pay

## 2017-06-03 VITALS — BP 129/83 | HR 64 | Temp 97.9°F | Wt 206.8 lb

## 2017-06-03 DIAGNOSIS — Z760 Encounter for issue of repeat prescription: Secondary | ICD-10-CM

## 2017-06-03 DIAGNOSIS — I10 Essential (primary) hypertension: Secondary | ICD-10-CM

## 2017-06-03 DIAGNOSIS — M869 Osteomyelitis, unspecified: Secondary | ICD-10-CM

## 2017-06-03 MED ORDER — GABAPENTIN 300 MG PO CAPS: 300.0000 mg | ORAL_CAPSULE | Freq: Three times a day (TID) | ORAL | 1 refills | Status: DC

## 2017-06-03 MED ORDER — POLYETHYLENE GLYCOL 3350 17 G PO PACK: 17.0000 g | PACK | Freq: Every day | ORAL | 0 refills | Status: DC

## 2017-06-03 MED ORDER — CARVEDILOL 6.25 MG PO TABS: 6.2500 mg | ORAL_TABLET | Freq: Two times a day (BID) | ORAL | 2 refills | Status: DC

## 2017-06-03 MED ORDER — OXYCODONE-ACETAMINOPHEN 10-325 MG PO TABS
1.0000 | ORAL_TABLET | Freq: Three times a day (TID) | ORAL | 0 refills | Status: DC | PRN
Start: 2017-06-03 — End: 2017-06-06

## 2017-06-03 NOTE — Patient Instructions (Signed)
Bone and Joint Infections, Adult Bone infections (osteomyelitis) and joint infections (septic arthritis) occur when bacteria or other germs get inside a bone or a joint. This can happen if you have an infection in another part of your body that spreads through your blood. Germs from your skin or from outside of your body can also cause this type of infection if you have a wound or a broken bone (fracture) that breaks the skin. Anyone can get a bone infection or joint infection. You may be more likely to get this type of infection if you have a condition, such as diabetes, that lowers your ability to fight infection or increases your chances of getting an infection. Bone and joint infections can cause damage, and they can spread to other areas of your body. They need to be treated quickly. What are the causes? Most bone and joint infections are caused by bacteria. They can also be caused by other germs, such as viruses and funguses. What increases the risk? This condition is more likely to develop in:  People who recently had surgery, especially bone or joint surgery.  People who have a long-term (chronic) disease, such as: ? HIV (human immunodeficiency virus). ? Diabetes. ? Rheumatoid arthritis. ? Sickle cell anemia.  Elderly people.  People who take medicines that block or weaken the body's defense system (immune system).  People who have a condition that reduces their blood flow.  People who are on kidney dialysis.  People who have an artificial joint.  People who have had a joint or bone repaired with plates or screws (surgical hardware).  People who use or abuse IV drugs.  People who have had trauma, such as stepping on a nail.  What are the signs or symptoms? Symptoms vary depending on the type and location of your infection. Common symptoms of bone and joint infections include:  Fever and chills.  Redness and warmth.  Swelling.  Pain and stiffness.  Drainage of fluid  or pus near the infection.  Weight loss and fatigue.  Decreased ability to use a hand or foot.  How is this diagnosed? This condition may be diagnosed based on symptoms, medical history, a physical exam, and diagnostic tests. Tests can help to identify the cause of the infection. You may have various tests, such as:  A sample of tissue, fluid, or blood taken to be examined under a microscope.  A procedure to remove fluid from the infected joint with a needle (joint aspiration) for testing in a lab.  Pus or discharge swabbed from a wound for testing to identify germs and to determine what type of medicine will kill them (culture and sensitivity).  Blood tests to look for evidence of infection and inflammation (biomarkers).  Imaging studies to determine how severe the bone or joint infection is. These may include: ? X-rays. ? CT scan. ? MRI. ? Bone scan.  How is this treated? Treatment depends on the cause and type of infection. Antibiotic medicines are usually the first treatment for a bone or joint infection. Treatment with antibiotics may include:  Getting IV antibiotics. This may be done in a hospital at first. You may have to continue IV antibiotics at home for several weeks. You may also have to take antibiotics by mouth for several weeks after that.  Taking more than one kind of antibiotic. Treatment may start with a type of antibiotic that works against many different bacteria (broad spectrumantibiotics). IV antibiotics may be changed if tests show that another   type may work better.  Other treatments may include:  Draining fluid from the joint by placing a needle into it (aspiration).  Surgery to remove: ? Dead or dying tissue from a bone or joint. ? An infected artificial joint. ? Infected plates or screws that were used to repair a broken bone.  Follow these instructions at home:  Take medicines only as directed by your health care provider.  Take your antibiotic  medicine as directed by your health care provider. Finish the antibiotic even if you start to feel better.  Follow instructions from your health care provider about how to take IV antibiotics at home.  Ask your health care provider if you have any restrictions on your activities.  Keep all follow-up visits as directed by your health care provider. This is important. Contact a health care provider if:  You have a fever or chills.  You have redness, warmth, pain, or swelling that returns after treatment. Get help right away if:  You have rapid breathing or you have trouble breathing.  You have chest pain.  You cannot drink fluids or make urine.  The affected arm or leg swells, changes color, or turns blue. This information is not intended to replace advice given to you by your health care provider. Make sure you discuss any questions you have with your health care provider. Document Released: 05/07/2005 Document Revised: 10/13/2015 Document Reviewed: 05/05/2014 Elsevier Interactive Patient Education  2018 Elsevier Inc.  

## 2017-06-03 NOTE — Progress Notes (Signed)
Subjective:  Patient ID: [name], male    DOB: January 26, 1966  Age: 52 y.o. MRN: [id number]  CC:  Fungal osteomyelitis  HPI  Randall Paver Johnsonis a 52 y.o.malewith a medical history of CHF, chronic bronchitis, chronic lower back pain, COPD, discitis, panic attacks, heroin use, and tobacco abuse presentson hospital f/u for vertebral osteomyelitis from 05/24/17 to 05/28/17. 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 Aerobic/Anaerobic Culture revealed no growth aerobically or anaerobically. Patient was prescribed Oxycodone 20 mg which reportedly only moderately relieved pain. Smokes marijuana to help him reduce pain further. Requests more oxycodone 20 mg to help with pain. However, overall, feels he is doing much better and is able to move more freely without pain. Says there are times when the pain flares and leaves him incapacitated in bed.      Outpatient Medications Prior to Visit  Medication Sig Dispense Refill  . amLODipine (NORVASC) 10 MG tablet Take 1 tablet (10 mg total) by mouth daily. 90 tablet 1  . ENSURE (ENSURE) Take 237 mLs by mouth daily.    [name] lisinopril-hydrochlorothiazide (ZESTORETIC) 20-12.5 MG tablet Take 2 tablets by mouth daily. 180 tablet 1  . polyethylene glycol (MIRALAX / GLYCOLAX) packet Take 17 g by mouth daily. (Patient taking differently: Take 17 g by mouth daily as needed. ) 14 each 0  . posaconazole (NOXAFIL) 100 MG TBEC delayed-release tablet Take 3 tablets (300 mg total) daily by mouth. 42 tablet 11  . carvedilol (COREG) 6.25 MG tablet Take 1 tablet (6.25 mg total) by mouth 2 (two) times daily with a meal. (Patient not taking: Reported on 05/24/2017) 30 tablet 2  . OVER THE COUNTER MEDICATION Take 1 tablet by mouth daily.    [name] oxyCODONE 20 MG TABS Take 1 tablet (20 mg total) by mouth every 6 (six) hours as needed for up to 6 days. (Patient not taking: Reported on  06/03/2017) 24 tablet 0   No facility-administered medications prior to visit.      ROS Review of Systems  Constitutional: Negative for chills, fever and malaise/fatigue.  Eyes: Negative for blurred vision.  Respiratory: Negative for shortness of breath.   Cardiovascular: Negative for chest pain and palpitations.  Gastrointestinal: Negative for abdominal pain and nausea.  Genitourinary: Negative for dysuria and hematuria.  Musculoskeletal: Positive for back pain. Negative for joint pain and myalgias.  Skin: Negative for rash.  Neurological: Negative for tingling and headaches.  Psychiatric/Behavioral: Negative for depression. The patient is not nervous/anxious.     Objective:  BP 129/83 (BP Location: Right Arm, Patient Position: Sitting, Cuff Size: Normal)   Pulse 64   Temp 97.9 F (36.6 C) (Oral)   Wt 206 lb 12.8 oz (93.8 kg)   SpO2 97%   BMI 30.54 kg/m   BP/Weight 06/03/2017 4/85/4627 0/07/5007  Systolic BP 381 829 937  Diastolic BP 83 68 83  Wt. (Lbs) 206.8 - -  BMI 30.54 - -  Some encounter information is confidential and restricted. Go to Review Flowsheets activity to see all data.      Physical Exam  Constitutional: He is oriented to person, place, and time.  Well developed, well nourished, laying supine on exam table. Moves more fluidly than in previous visit. Does not appear toxic.   HENT:  Head: Normocephalic and atraumatic.  Eyes: No scleral icterus.  Neck: Normal range of motion.  Cardiovascular: Normal rate, regular rhythm and  normal heart sounds.  Pulmonary/Chest: Effort normal and breath sounds normal.  Musculoskeletal: He exhibits no edema.  Neurological: He is alert and oriented to person, place, and time. No cranial nerve deficit. Coordination normal.  Skin: Skin is warm and dry. No rash noted. No erythema. No pallor.  Psychiatric: He has a normal mood and affect. Thought content normal.  Seems to be under influence of narcotic or other substance due  to slower speech and glossed over eyes.  Vitals reviewed.    Assessment & Plan:    1. 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HCC) - Decrease oxyCODONE-acetaminophen (PERCOCET) 10-325 MG tablet; Take 1 tablet by mouth every 8 (eight) hours as needed for pain.  Dispense: 30 tablet; Refill: 0. Pt did not agree to adding gabapentin for pain relief. Said he wanted Percocet 20 mg. I told him that I want him to take gabapentin or I would need to send him to pain clinic for which he has already been referred and declined.  I asked that he take meds as directed. I will be down titrating narcotic medications. In my opinion, it was unnecessary for patient to have been prescribed 20 mg prednisone in light of minimal to no pain when laying supine. Patient could have tolerated some pain and been given a milder narcotic. Overall pt is doing better and is moving more fluidly. He will be seeing ID tomorrow. Overall, I am concerned that patient will have addiction to narcotics and therefore I will be down titrating narcotics as soon as possible. Patient understands that he will be receiving lower doses in the upcoming appointments.  - Begin gabapentin (NEURONTIN) 300 MG capsule; Take 1 capsule (300 mg total) by mouth 3 (three) times daily.  Dispense: 90 capsule; Refill: 1 - CBC with Differential - Comprehensive metabolic panel  2. Hypertension, unspecified type - Refill carvedilol (COREG) 6.25 MG tablet; Take 1 tablet (6.25 mg total) by mouth 2 (two) times daily with a meal.  Dispense: 30 tablet; Refill: 2  3. Medication refill - Refill polyethylene glycol (MIRALAX / GLYCOLAX) packet; Take 17 g by mouth daily.  Dispense: 14 each; Refill: 0   Meds ordered this encounter  Medications  . oxyCODONE-acetaminophen (PERCOCET) 10-325 MG tablet    Sig: Take 1 tablet by mouth every 8 (eight) hours as needed for pain.    Dispense:  30 tablet    Refill:  0    Order Specific Question:   Supervising Provider    Answer:   Tresa Garter [id number]  . gabapentin (NEURONTIN) 300 MG capsule    Sig: Take 1 capsule (300 mg total) by mouth 3 (three) times daily.    Dispense:  90 capsule    Refill:  1    Order Specific Question:   Supervising Provider    Answer:   Tresa Garter [id number]  . carvedilol (COREG) 6.25 MG tablet    Sig: Take 1 tablet (6.25 mg total) by mouth 2 (two) times daily with a meal.    Dispense:  30 tablet    Refill:  2    Order Specific Question:   Supervising Provider    Answer:   Tresa Garter [id number]  . polyethylene glycol (MIRALAX / GLYCOLAX) packet    Sig: Take 17 g by mouth daily.    Dispense:  14 each    Refill:  0    Order Specific Question:   Supervising Provider    Answer:   Tresa Garter [id number]    Follow-up:  Return in about 10 days (around 06/13/2017) for osteomyelitis.   Clent Demark PA

## 2017-06-04 ENCOUNTER — Telehealth (INDEPENDENT_AMBULATORY_CARE_PROVIDER_SITE_OTHER): Payer: Self-pay | Admitting: Physician Assistant

## 2017-06-04 ENCOUNTER — Ambulatory Visit: Payer: Self-pay | Admitting: Infectious Disease

## 2017-06-04 ENCOUNTER — Other Ambulatory Visit: Payer: Self-pay | Admitting: Pharmacist

## 2017-06-04 LAB — COMPREHENSIVE METABOLIC PANEL
ALBUMIN: 4.3 g/dL (ref 3.5–5.5)
ALK PHOS: 99 IU/L (ref 39–117)
ALT: 84 IU/L — ABNORMAL HIGH (ref 0–44)
AST: 64 IU/L — AB (ref 0–40)
Albumin/Globulin Ratio: 1.1 — ABNORMAL LOW (ref 1.2–2.2)
BILIRUBIN TOTAL: 0.5 mg/dL (ref 0.0–1.2)
BUN / CREAT RATIO: 9 (ref 9–20)
BUN: 7 mg/dL (ref 6–24)
CHLORIDE: 100 mmol/L (ref 96–106)
CO2: 28 mmol/L (ref 20–29)
Calcium: 9 mg/dL (ref 8.7–10.2)
Creatinine, Ser: 0.77 mg/dL (ref 0.76–1.27)
GFR calc Af Amer: 121 mL/min/{1.73_m2} (ref >59)
GFR calc non Af Amer: 104 mL/min/{1.73_m2} (ref >59)
GLOBULIN, TOTAL: 4 g/dL (ref 1.5–4.5)
GLUCOSE: 124 mg/dL — AB (ref 65–99)
Potassium: 4.1 mmol/L (ref 3.5–5.2)
SODIUM: 139 mmol/L (ref 134–144)
Total Protein: 8.3 g/dL (ref 6.0–8.5)

## 2017-06-04 LAB — CBC WITH DIFFERENTIAL/PLATELET
Basophils Absolute: 0.2 10*3/uL (ref 0.0–0.2)
Basos: 2 %
EOS (ABSOLUTE): 0.2 10*3/uL (ref 0.0–0.4)
EOS: 3 %
HEMOGLOBIN: 12.7 g/dL — AB (ref 13.0–17.7)
Hematocrit: 36 % — ABNORMAL LOW (ref 37.5–51.0)
IMMATURE GRANULOCYTES: 1 %
Immature Grans (Abs): 0.1 10*3/uL (ref 0.0–0.1)
LYMPHS ABS: 2.9 10*3/uL (ref 0.7–3.1)
Lymphs: 43 %
MCH: 33.5 pg — AB (ref 26.6–33.0)
MCHC: 35.3 g/dL (ref 31.5–35.7)
MCV: 95 fL (ref 79–97)
MONOS ABS: 0.4 10*3/uL (ref 0.1–0.9)
Monocytes: 7 %
NEUTROS PCT: 44 %
Neutrophils Absolute: 3.1 10*3/uL (ref 1.4–7.0)
Platelets: 270 10*3/uL (ref 150–379)
RBC: 3.79 x10E6/uL — AB (ref 4.14–5.80)
RDW: 15.7 % — AB (ref 12.3–15.4)
WBC: 6.7 10*3/uL (ref 3.4–10.8)

## 2017-06-04 NOTE — Telephone Encounter (Signed)
Thank you Tempestt. I confirm that I did deny the prescription to the patient to those reviewing this message. I acted out of compassion when I prescribed percocet but patient and the group he was with is already suspect for possible abuse or trafficking. I will continue to assess patient but he will no longer be receiving narcotics from me.

## 2017-06-04 NOTE — Telephone Encounter (Signed)
Called patient and informed that unfortunately his PCP will not issue another Rx for pain medications. Patient was upset and did not understand why, as the Rx he lost has not been filled. Explained that I understood that but per provider the answer to his new Rx request is NO. Patient demanded to speak with provider who was seeing other scheduled patients at the time. Apologized to patient, who then stated my apology does not help him. Apologized again and patient hung up. During conversation with patient a man can be heard in the background as if he may have been coaching patient through what to say.

## 2017-06-04 NOTE — Telephone Encounter (Signed)
My computer froze.  Patient lvm saying that he lost his RX for pain medicine if you can fax a new prescription to [facility] his phone number  [phone] Thank you

## 2017-06-04 NOTE — Telephone Encounter (Signed)
Patient lvm saying that he lost his RX Pain medicine

## 2017-06-06 ENCOUNTER — Emergency Department (HOSPITAL_COMMUNITY)
Admission: EM | Admit: 2017-06-06 | Discharge: 2017-06-06 | Disposition: A | Discharge status: Home / Self Care | Payer: Self-pay | Attending: Emergency Medicine | Admitting: Emergency Medicine

## 2017-06-06 ENCOUNTER — Other Ambulatory Visit: Payer: Self-pay

## 2017-06-06 ENCOUNTER — Encounter (HOSPITAL_COMMUNITY): Payer: Self-pay

## 2017-06-06 ENCOUNTER — Emergency Department (HOSPITAL_COMMUNITY): Payer: Self-pay

## 2017-06-06 ENCOUNTER — Telehealth (INDEPENDENT_AMBULATORY_CARE_PROVIDER_SITE_OTHER): Payer: Self-pay

## 2017-06-06 DIAGNOSIS — Z79899 Other long term (current) drug therapy: Secondary | ICD-10-CM | POA: Insufficient documentation

## 2017-06-06 DIAGNOSIS — M545 Low back pain: Secondary | ICD-10-CM | POA: Insufficient documentation

## 2017-06-06 DIAGNOSIS — I509 Heart failure, unspecified: Secondary | ICD-10-CM | POA: Insufficient documentation

## 2017-06-06 DIAGNOSIS — I110 Hypertensive heart disease with heart failure: Secondary | ICD-10-CM | POA: Insufficient documentation

## 2017-06-06 DIAGNOSIS — J449 Chronic obstructive pulmonary disease, unspecified: Secondary | ICD-10-CM | POA: Insufficient documentation

## 2017-06-06 DIAGNOSIS — M4644 Discitis, unspecified, thoracic region: Secondary | ICD-10-CM

## 2017-06-06 DIAGNOSIS — G8929 Other chronic pain: Secondary | ICD-10-CM

## 2017-06-06 DIAGNOSIS — F17210 Nicotine dependence, cigarettes, uncomplicated: Secondary | ICD-10-CM | POA: Insufficient documentation

## 2017-06-06 LAB — CBC WITH DIFFERENTIAL/PLATELET
Basophils Absolute: 0 10*3/uL (ref 0.0–0.1)
Basophils Relative: 0 %
EOS PCT: 2 %
Eosinophils Absolute: 0.1 10*3/uL (ref 0.0–0.7)
HCT: 42.7 % (ref 39.0–52.0)
Hemoglobin: 14.7 g/dL (ref 13.0–17.0)
LYMPHS ABS: 2.2 10*3/uL (ref 0.7–4.0)
LYMPHS PCT: 32 %
MCH: 33.9 pg (ref 26.0–34.0)
MCHC: 34.4 g/dL (ref 30.0–36.0)
MCV: 98.6 fL (ref 78.0–100.0)
Monocytes Absolute: 0.6 10*3/uL (ref 0.1–1.0)
Monocytes Relative: 9 %
Neutro Abs: 3.9 10*3/uL (ref 1.7–7.7)
Neutrophils Relative %: 57 %
Platelets: 290 10*3/uL (ref 150–400)
RBC: 4.33 MIL/uL (ref 4.22–5.81)
RDW: 14.9 % (ref 11.5–15.5)
WBC: 6.8 10*3/uL (ref 4.0–10.5)

## 2017-06-06 LAB — BASIC METABOLIC PANEL
Anion gap: 10 (ref 5–15)
BUN: 13 mg/dL (ref 6–20)
CHLORIDE: 98 mmol/L — AB (ref 101–111)
CO2: 27 mmol/L (ref 22–32)
Calcium: 9.8 mg/dL (ref 8.9–10.3)
Creatinine, Ser: 0.91 mg/dL (ref 0.61–1.24)
GFR calc Af Amer: >60 mL/min (ref >60)
GFR calc non Af Amer: >60 mL/min (ref >60)
Glucose, Bld: 130 mg/dL — ABNORMAL HIGH (ref 65–99)
POTASSIUM: 3.3 mmol/L — AB (ref 3.5–5.1)
SODIUM: 135 mmol/L (ref 135–145)

## 2017-06-06 LAB — SEDIMENTATION RATE: Sed Rate: 90 mm/hr — ABNORMAL HIGH (ref 0–16)

## 2017-06-06 MED ORDER — GADOBENATE DIMEGLUMINE 529 MG/ML IV SOLN
20.0000 mL | Freq: Once | INTRAVENOUS | Status: AC | PRN
  Administered 2017-06-06: 18 mL via INTRAVENOUS

## 2017-06-06 MED ORDER — HYDROMORPHONE HCL 1 MG/ML IJ SOLN
1.0000 mg | Freq: Once | INTRAMUSCULAR | Status: AC
  Administered 2017-06-06: 1 mg via INTRAVENOUS
  Filled 2017-06-06: qty 1

## 2017-06-06 MED ORDER — ONDANSETRON HCL 4 MG/2ML IJ SOLN
4.0000 mg | Freq: Once | INTRAMUSCULAR | Status: AC
  Administered 2017-06-06: 4 mg via INTRAVENOUS
  Filled 2017-06-06: qty 2

## 2017-06-06 MED ORDER — OXYCODONE HCL 20 MG PO TABS: 20.0000 mg | ORAL_TABLET | Freq: Four times a day (QID) | ORAL | 0 refills | Status: DC | PRN

## 2017-06-06 MED ORDER — DIPHENHYDRAMINE HCL 50 MG/ML IJ SOLN
25.0000 mg | Freq: Once | INTRAMUSCULAR | Status: AC
  Administered 2017-06-06: 25 mg via INTRAVENOUS
  Filled 2017-06-06: qty 1
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
## 2017-06-06 NOTE — ED Triage Notes (Signed)
Pt arrives via [facility] complaining of severe back pain. Pt reports he has a fungal infection in his spine and is taking an antifungal 'Noxafil' without relief.

## 2017-06-06 NOTE — ED Notes (Signed)
Patient transported to MRI 

## 2017-06-06 NOTE — Telephone Encounter (Signed)
Left voicemail asking patient to call the office. [name]

## 2017-06-06 NOTE — Discharge Instructions (Signed)
Continue taking your antibiotic.  You will need to get future narcotic prescriptions from your primary care provider.

## 2017-06-06 NOTE — Telephone Encounter (Signed)
-----   Message from Clent Demark, PA-C sent at 06/04/2017  1:48 PM EST ----- Liver enzymes are mildly elevated and he is slightly anemic. This may be due to his fungal treatment. Continue treatment with ID.

## 2017-06-07 ENCOUNTER — Encounter (INDEPENDENT_AMBULATORY_CARE_PROVIDER_SITE_OTHER): Payer: Self-pay

## 2017-06-07 ENCOUNTER — Telehealth (INDEPENDENT_AMBULATORY_CARE_PROVIDER_SITE_OTHER): Payer: Self-pay

## 2017-06-07 NOTE — Telephone Encounter (Signed)
Second attempt to reach patient, left voicemail. Will mail results. Nat Christen, CMA

## 2017-06-07 NOTE — Telephone Encounter (Signed)
-----   Message from Clent Demark, PA-C sent at 06/04/2017  1:48 PM EST ----- Liver enzymes are mildly elevated and he is slightly anemic. This may be due to his fungal treatment. Continue treatment with ID.

## 2017-06-09 ENCOUNTER — Encounter (HOSPITAL_COMMUNITY): Payer: Self-pay | Admitting: *Deleted

## 2017-06-09 ENCOUNTER — Other Ambulatory Visit: Payer: Self-pay

## 2017-06-09 ENCOUNTER — Inpatient Hospital Stay (HOSPITAL_COMMUNITY)
Admission: EM | Admit: 2017-06-09 | Discharge: 2017-06-11 | DRG: 442 | Disposition: A | Discharge status: Home / Self Care | Payer: Self-pay | Attending: Internal Medicine | Admitting: Internal Medicine

## 2017-06-09 DIAGNOSIS — I10 Essential (primary) hypertension: Secondary | ICD-10-CM | POA: Diagnosis present

## 2017-06-09 DIAGNOSIS — R001 Bradycardia, unspecified: Secondary | ICD-10-CM | POA: Diagnosis not present

## 2017-06-09 DIAGNOSIS — Z765 Malingerer [conscious simulation]: Secondary | ICD-10-CM

## 2017-06-09 DIAGNOSIS — J440 Chronic obstructive pulmonary disease with acute lower respiratory infection: Secondary | ICD-10-CM | POA: Diagnosis present

## 2017-06-09 DIAGNOSIS — B3789 Other sites of candidiasis: Secondary | ICD-10-CM | POA: Diagnosis present

## 2017-06-09 DIAGNOSIS — Z880 Allergy status to penicillin: Secondary | ICD-10-CM

## 2017-06-09 DIAGNOSIS — E876 Hypokalemia: Secondary | ICD-10-CM | POA: Diagnosis present

## 2017-06-09 DIAGNOSIS — R945 Abnormal results of liver function studies: Secondary | ICD-10-CM | POA: Diagnosis present

## 2017-06-09 DIAGNOSIS — I5032 Chronic diastolic (congestive) heart failure: Secondary | ICD-10-CM | POA: Diagnosis present

## 2017-06-09 DIAGNOSIS — G894 Chronic pain syndrome: Secondary | ICD-10-CM | POA: Diagnosis present

## 2017-06-09 DIAGNOSIS — M545 Low back pain, unspecified: Secondary | ICD-10-CM | POA: Diagnosis present

## 2017-06-09 DIAGNOSIS — R7989 Other specified abnormal findings of blood chemistry: Secondary | ICD-10-CM | POA: Diagnosis present

## 2017-06-09 DIAGNOSIS — B1920 Unspecified viral hepatitis C without hepatic coma: Principal | ICD-10-CM | POA: Diagnosis present

## 2017-06-09 DIAGNOSIS — F17210 Nicotine dependence, cigarettes, uncomplicated: Secondary | ICD-10-CM | POA: Diagnosis present

## 2017-06-09 DIAGNOSIS — F1120 Opioid dependence, uncomplicated: Secondary | ICD-10-CM | POA: Diagnosis present

## 2017-06-09 DIAGNOSIS — M869 Osteomyelitis, unspecified: Secondary | ICD-10-CM | POA: Diagnosis present

## 2017-06-09 DIAGNOSIS — J209 Acute bronchitis, unspecified: Secondary | ICD-10-CM | POA: Diagnosis present

## 2017-06-09 DIAGNOSIS — M4620 Osteomyelitis of vertebra, site unspecified: Secondary | ICD-10-CM | POA: Diagnosis present

## 2017-06-09 DIAGNOSIS — M546 Pain in thoracic spine: Secondary | ICD-10-CM | POA: Diagnosis present

## 2017-06-09 DIAGNOSIS — G8929 Other chronic pain: Secondary | ICD-10-CM | POA: Diagnosis present

## 2017-06-09 DIAGNOSIS — I110 Hypertensive heart disease with heart failure: Secondary | ICD-10-CM | POA: Diagnosis present

## 2017-06-09 DIAGNOSIS — R748 Abnormal levels of other serum enzymes: Secondary | ICD-10-CM | POA: Diagnosis present

## 2017-06-09 MED ORDER — IPRATROPIUM BROMIDE 0.02 % IN SOLN: 0.5000 mg | Freq: Once | RESPIRATORY_TRACT | Status: DC

## 2017-06-09 MED ORDER — ALBUTEROL SULFATE (2.5 MG/3ML) 0.083% IN NEBU: 5.0000 mg | INHALATION_SOLUTION | Freq: Once | RESPIRATORY_TRACT | Status: DC

## 2017-06-09 NOTE — ED Triage Notes (Signed)
Pt c/o sob and productive cough that started yesterday; pt states he is coughing up green sputum

## 2017-06-10 ENCOUNTER — Other Ambulatory Visit: Payer: Self-pay

## 2017-06-10 ENCOUNTER — Encounter (HOSPITAL_COMMUNITY): Payer: Self-pay | Admitting: Internal Medicine

## 2017-06-10 ENCOUNTER — Emergency Department (HOSPITAL_COMMUNITY): Payer: Self-pay

## 2017-06-10 DIAGNOSIS — E876 Hypokalemia: Secondary | ICD-10-CM | POA: Diagnosis present

## 2017-06-10 DIAGNOSIS — I10 Essential (primary) hypertension: Secondary | ICD-10-CM

## 2017-06-10 DIAGNOSIS — J209 Acute bronchitis, unspecified: Secondary | ICD-10-CM

## 2017-06-10 DIAGNOSIS — R7989 Other specified abnormal findings of blood chemistry: Secondary | ICD-10-CM | POA: Diagnosis present

## 2017-06-10 DIAGNOSIS — M869 Osteomyelitis, unspecified: Secondary | ICD-10-CM

## 2017-06-10 DIAGNOSIS — R945 Abnormal results of liver function studies: Secondary | ICD-10-CM | POA: Diagnosis present

## 2017-06-10 DIAGNOSIS — I5032 Chronic diastolic (congestive) heart failure: Secondary | ICD-10-CM | POA: Diagnosis present

## 2017-06-10 LAB — COMPREHENSIVE METABOLIC PANEL
ALT: 356 U/L — ABNORMAL HIGH (ref 17–63)
ANION GAP: 9 (ref 5–15)
AST: 481 U/L — ABNORMAL HIGH (ref 15–41)
Albumin: 3.7 g/dL (ref 3.5–5.0)
Alkaline Phosphatase: 86 U/L (ref 38–126)
BUN: 10 mg/dL (ref 6–20)
CO2: 25 mmol/L (ref 22–32)
Calcium: 8.8 mg/dL — ABNORMAL LOW (ref 8.9–10.3)
Chloride: 100 mmol/L — ABNORMAL LOW (ref 101–111)
Creatinine, Ser: 0.61 mg/dL (ref 0.61–1.24)
GFR calc Af Amer: >60 mL/min (ref >60)
GFR calc non Af Amer: >60 mL/min (ref >60)
Glucose, Bld: 123 mg/dL — ABNORMAL HIGH (ref 65–99)
POTASSIUM: 2.9 mmol/L — AB (ref 3.5–5.1)
SODIUM: 134 mmol/L — AB (ref 135–145)
Total Bilirubin: 1 mg/dL (ref 0.3–1.2)
Total Protein: 8.5 g/dL — ABNORMAL HIGH (ref 6.5–8.1)

## 2017-06-10 LAB — RAPID URINE DRUG SCREEN, HOSP PERFORMED
Amphetamines: NOT DETECTED
BARBITURATES: NOT DETECTED
Benzodiazepines: POSITIVE — AB
Cocaine: POSITIVE — AB
Opiates: NOT DETECTED
Tetrahydrocannabinol: NOT DETECTED

## 2017-06-10 LAB — CBC WITH DIFFERENTIAL/PLATELET
Basophils Absolute: 0 10*3/uL (ref 0.0–0.1)
Basophils Relative: 0 %
Eosinophils Absolute: 0.1 10*3/uL (ref 0.0–0.7)
Eosinophils Relative: 2 %
HEMATOCRIT: 38.2 % — AB (ref 39.0–52.0)
Hemoglobin: 13.2 g/dL (ref 13.0–17.0)
LYMPHS ABS: 1 10*3/uL (ref 0.7–4.0)
LYMPHS PCT: 23 %
MCH: 33.7 pg (ref 26.0–34.0)
MCHC: 34.6 g/dL (ref 30.0–36.0)
MCV: 97.4 fL (ref 78.0–100.0)
MONOS PCT: 9 %
Monocytes Absolute: 0.4 10*3/uL (ref 0.1–1.0)
NEUTROS ABS: 2.8 10*3/uL (ref 1.7–7.7)
NEUTROS PCT: 66 %
Platelets: 244 10*3/uL (ref 150–400)
RBC: 3.92 MIL/uL — ABNORMAL LOW (ref 4.22–5.81)
RDW: 15.3 % (ref 11.5–15.5)
WBC: 4.2 10*3/uL (ref 4.0–10.5)

## 2017-06-10 LAB — TROPONIN I: Troponin I: <0.03 ng/mL (ref <0.03)

## 2017-06-10 LAB — MAGNESIUM: MAGNESIUM: 1.6 mg/dL — AB (ref 1.7–2.4)

## 2017-06-10 MED ORDER — POTASSIUM CHLORIDE CRYS ER 20 MEQ PO TBCR
40.0000 meq | EXTENDED_RELEASE_TABLET | Freq: Once | ORAL | Status: AC
  Administered 2017-06-10: 40 meq via ORAL
  Filled 2017-06-10: qty 2

## 2017-06-10 MED ORDER — SODIUM CHLORIDE 0.9 % IV SOLN
100.0000 mg | INTRAVENOUS | Status: DC
  Administered 2017-06-11: 100 mg via INTRAVENOUS
  Filled 2017-06-10: qty 100

## 2017-06-10 MED ORDER — ALBUTEROL SULFATE (2.5 MG/3ML) 0.083% IN NEBU
2.5000 mg | INHALATION_SOLUTION | Freq: Once | RESPIRATORY_TRACT | Status: AC
  Administered 2017-06-10: 2.5 mg via RESPIRATORY_TRACT

## 2017-06-10 MED ORDER — IPRATROPIUM-ALBUTEROL 0.5-2.5 (3) MG/3ML IN SOLN
3.0000 mL | Freq: Once | RESPIRATORY_TRACT | Status: AC
  Administered 2017-06-10: 3 mL via RESPIRATORY_TRACT

## 2017-06-10 MED ORDER — KETOROLAC TROMETHAMINE 30 MG/ML IJ SOLN
30.0000 mg | Freq: Once | INTRAMUSCULAR | Status: AC
  Administered 2017-06-10: 30 mg via INTRAVENOUS
  Filled 2017-06-10: qty 1

## 2017-06-10 MED ORDER — MAGNESIUM OXIDE 400 (241.3 MG) MG PO TABS
400.0000 mg | ORAL_TABLET | Freq: Every day | ORAL | Status: DC
  Administered 2017-06-10 – 2017-06-11 (×2): 400 mg via ORAL
  Filled 2017-06-10 (×2): qty 1

## 2017-06-10 MED ORDER — METHYLPREDNISOLONE SODIUM SUCC 125 MG IJ SOLR
125.0000 mg | Freq: Once | INTRAMUSCULAR | Status: AC
  Administered 2017-06-10: 125 mg via INTRAVENOUS
  Filled 2017-06-10: qty 2

## 2017-06-10 MED ORDER — CARVEDILOL 6.25 MG PO TABS: 6.2500 mg | ORAL_TABLET | Freq: Two times a day (BID) | ORAL | Status: DC

## 2017-06-10 MED ORDER — LISINOPRIL 20 MG PO TABS
20.0000 mg | ORAL_TABLET | Freq: Every day | ORAL | Status: DC
  Administered 2017-06-10 – 2017-06-11 (×2): 20 mg via ORAL
  Filled 2017-06-10 (×2): qty 1

## 2017-06-10 MED ORDER — POTASSIUM CHLORIDE IN NACL 20-0.9 MEQ/L-% IV SOLN
INTRAVENOUS | Status: AC
  Administered 2017-06-10: 08:00:00 via INTRAVENOUS

## 2017-06-10 MED ORDER — IPRATROPIUM-ALBUTEROL 0.5-2.5 (3) MG/3ML IN SOLN
3.0000 mL | Freq: Four times a day (QID) | RESPIRATORY_TRACT | Status: DC
  Administered 2017-06-10 (×3): 3 mL via RESPIRATORY_TRACT
  Filled 2017-06-10 (×3): qty 3

## 2017-06-10 MED ORDER — TRAMADOL HCL 50 MG PO TABS
50.0000 mg | ORAL_TABLET | ORAL | Status: DC | PRN
  Administered 2017-06-10 – 2017-06-11 (×6): 50 mg via ORAL
  Filled 2017-06-10 (×6): qty 1

## 2017-06-10 MED ORDER — ANIDULAFUNGIN 100 MG IV SOLR
200.0000 mg | Freq: Once | INTRAVENOUS | Status: AC
  Administered 2017-06-10: 200 mg via INTRAVENOUS
  Filled 2017-06-10: qty 200

## 2017-06-10 MED ORDER — ONDANSETRON HCL 4 MG PO TABS: 4.0000 mg | ORAL_TABLET | Freq: Four times a day (QID) | ORAL | Status: DC | PRN

## 2017-06-10 MED ORDER — ALBUTEROL SULFATE (2.5 MG/3ML) 0.083% IN NEBU: 2.5000 mg | INHALATION_SOLUTION | Freq: Four times a day (QID) | RESPIRATORY_TRACT | Status: DC

## 2017-06-10 MED ORDER — ONDANSETRON HCL 4 MG/2ML IJ SOLN: 4.0000 mg | Freq: Four times a day (QID) | INTRAMUSCULAR | Status: DC | PRN

## 2017-06-10 MED ORDER — LISINOPRIL-HYDROCHLOROTHIAZIDE 20-12.5 MG PO TABS: 1.0000 | ORAL_TABLET | Freq: Every day | ORAL | Status: DC

## 2017-06-10 MED ORDER — IPRATROPIUM-ALBUTEROL 0.5-2.5 (3) MG/3ML IN SOLN: 3.0000 mL | RESPIRATORY_TRACT | Status: DC | PRN

## 2017-06-10 MED ORDER — POLYETHYLENE GLYCOL 3350 17 G PO PACK
17.0000 g | PACK | Freq: Every day | ORAL | Status: DC
  Administered 2017-06-10 – 2017-06-11 (×2): 17 g via ORAL
  Filled 2017-06-10 (×2): qty 1

## 2017-06-10 MED ORDER — ENSURE ENLIVE PO LIQD
237.0000 mL | Freq: Two times a day (BID) | ORAL | Status: DC
  Administered 2017-06-11: 237 mL via ORAL

## 2017-06-10 MED ORDER — IPRATROPIUM BROMIDE 0.02 % IN SOLN: 0.5000 mg | Freq: Four times a day (QID) | RESPIRATORY_TRACT | Status: DC

## 2017-06-10 MED ORDER — GABAPENTIN 300 MG PO CAPS: 300.0000 mg | ORAL_CAPSULE | Freq: Three times a day (TID) | ORAL | Status: DC

## 2017-06-10 MED ORDER — ALBUTEROL SULFATE (2.5 MG/3ML) 0.083% IN NEBU: 2.5000 mg | INHALATION_SOLUTION | RESPIRATORY_TRACT | Status: DC | PRN

## 2017-06-10 MED ORDER — MAGNESIUM SULFATE 2 GM/50ML IV SOLN
2.0000 g | Freq: Once | INTRAVENOUS | Status: AC
Start: 2017-06-10 — End: 2017-06-10
  Administered 2017-06-10: 2 g via INTRAVENOUS
  Filled 2017-06-10: qty 50

## 2017-06-10 MED ORDER — POTASSIUM CHLORIDE IN NACL 20-0.9 MEQ/L-% IV SOLN
INTRAVENOUS | Status: DC
  Administered 2017-06-10: 02:00:00 via INTRAVENOUS
  Filled 2017-06-10: qty 1000

## 2017-06-10 NOTE — ED Notes (Signed)
AC in to speak with patient regarding compliant about pain management.  The AC spent approx. 15-72min speaking with patient concerning complaint.  AC advised patient that she would refer his complaint to the office of  patient experience about his concerns.
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
## 2017-06-11 DIAGNOSIS — M4644 Discitis, unspecified, thoracic region: Secondary | ICD-10-CM

## 2017-06-11 DIAGNOSIS — R740 Nonspecific elevation of levels of transaminase and lactic acid dehydrogenase [LDH]: Secondary | ICD-10-CM

## 2017-06-11 DIAGNOSIS — Z8249 Family history of ischemic heart disease and other diseases of the circulatory system: Secondary | ICD-10-CM

## 2017-06-11 DIAGNOSIS — R748 Abnormal levels of other serum enzymes: Secondary | ICD-10-CM | POA: Diagnosis present

## 2017-06-11 DIAGNOSIS — I5030 Unspecified diastolic (congestive) heart failure: Secondary | ICD-10-CM

## 2017-06-11 DIAGNOSIS — F17210 Nicotine dependence, cigarettes, uncomplicated: Secondary | ICD-10-CM

## 2017-06-11 DIAGNOSIS — Z880 Allergy status to penicillin: Secondary | ICD-10-CM

## 2017-06-11 DIAGNOSIS — B379 Candidiasis, unspecified: Secondary | ICD-10-CM

## 2017-06-11 DIAGNOSIS — I110 Hypertensive heart disease with heart failure: Secondary | ICD-10-CM

## 2017-06-11 DIAGNOSIS — B1910 Unspecified viral hepatitis B without hepatic coma: Secondary | ICD-10-CM

## 2017-06-11 DIAGNOSIS — F1410 Cocaine abuse, uncomplicated: Secondary | ICD-10-CM

## 2017-06-11 DIAGNOSIS — R945 Abnormal results of liver function studies: Secondary | ICD-10-CM

## 2017-06-11 DIAGNOSIS — M4624 Osteomyelitis of vertebra, thoracic region: Secondary | ICD-10-CM

## 2017-06-11 LAB — HEPATITIS PANEL, ACUTE
HEP B S AG: NEGATIVE
Hep A IgM: NEGATIVE
Hep B C IgM: POSITIVE — AB

## 2017-06-11 LAB — COMPREHENSIVE METABOLIC PANEL
ALBUMIN: 3.4 g/dL — AB (ref 3.5–5.0)
ALT: 300 U/L — ABNORMAL HIGH (ref 17–63)
ANION GAP: 10 (ref 5–15)
AST: 175 U/L — ABNORMAL HIGH (ref 15–41)
Alkaline Phosphatase: 79 U/L (ref 38–126)
BUN: 10 mg/dL (ref 6–20)
CO2: 26 mmol/L (ref 22–32)
Calcium: 8.9 mg/dL (ref 8.9–10.3)
Chloride: 100 mmol/L — ABNORMAL LOW (ref 101–111)
Creatinine, Ser: 0.64 mg/dL (ref 0.61–1.24)
GFR calc Af Amer: >60 mL/min (ref >60)
GFR calc non Af Amer: >60 mL/min (ref >60)
GLUCOSE: 147 mg/dL — AB (ref 65–99)
POTASSIUM: 3.8 mmol/L (ref 3.5–5.1)
SODIUM: 136 mmol/L (ref 135–145)
Total Bilirubin: 0.8 mg/dL (ref 0.3–1.2)
Total Protein: 7.8 g/dL (ref 6.5–8.1)

## 2017-06-11 MED ORDER — IPRATROPIUM-ALBUTEROL 0.5-2.5 (3) MG/3ML IN SOLN
3.0000 mL | Freq: Two times a day (BID) | RESPIRATORY_TRACT | Status: DC
  Administered 2017-06-11: 3 mL via RESPIRATORY_TRACT
  Filled 2017-06-11: qty 3

## 2017-06-11 MED ORDER — ENOXAPARIN SODIUM 40 MG/0.4ML ~~LOC~~ SOLN
40.0000 mg | SUBCUTANEOUS | Status: DC
  Filled 2017-06-11: qty 0.4

## 2017-06-11 MED ORDER — KETOROLAC TROMETHAMINE 15 MG/ML IJ SOLN
15.0000 mg | Freq: Four times a day (QID) | INTRAMUSCULAR | Status: DC | PRN
  Filled 2017-06-11: qty 1

## 2017-06-11 MED ORDER — POSACONAZOLE 100 MG PO TBEC
300.0000 mg | DELAYED_RELEASE_TABLET | Freq: Every day | ORAL | Status: DC
  Filled 2017-06-11: qty 3

## 2017-06-11 MED ORDER — LISINOPRIL 20 MG PO TABS: 20.0000 mg | ORAL_TABLET | Freq: Every day | ORAL | 0 refills | Status: DC
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
## 2017-06-11 NOTE — Progress Notes (Signed)
PT Cancellation Note  Patient Details Name: [name] MRN: [id number] DOB: [birth year]   Cancelled Treatment:    Reason Eval/Treat Not Completed: PT screened, no needs identified, will sign off. Spoke with patient. He is very angry. He states he can move just fine and has no interest in working with therapy.    Lorriane Shire 06/11/2017, 11:23 AM

## 2017-06-11 NOTE — Progress Notes (Signed)
Patient admitted to floor from [facility]. Patient oriented to unit, and patient room. Cardiac monitor placed on patient and plan of care discussed with patient verbalizing understanding. No needs voiced. Will continue to monitor and follow orders for patient.

## 2017-06-11 NOTE — Progress Notes (Signed)
Pt being discharged home via [facility]. Pt alert and oriented x4. VSS. Pt c/o no pain at this time. No signs of respiratory distress. Education complete and care plans resolved. IV removed with catheter intact and pt tolerated well. No further issues at this time. Pt to follow up with PCP. Leanne Chang, RN

## 2017-06-11 NOTE — Consult Note (Signed)
[name] for Infectious Disease   Reason for Consult: Candida albicans vertebral osteomyelitis and discitis   Referring Physician: Dr. Maylene Roes  Principal Problem:   Abnormal liver function tests Active Problems:   Vertebral osteomyelitis ([place])   Chronic bilateral low back pain without sciatica   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HCC)   Thoracic back pain   Essential hypertension   Chronic diastolic CHF (congestive heart failure) (HCC)   Hypokalemia   Hypomagnesemia   Elevated liver enzymes   . enoxaparin (LOVENOX) injection  40 mg Subcutaneous Q24H  . feeding supplement (ENSURE ENLIVE)  237 mL Oral BID BM  . ipratropium-albuterol  3 mL Nebulization BID  . lisinopril  20 mg Oral Daily  . magnesium oxide  400 mg Oral Daily  . polyethylene glycol  17 g Oral Daily    Recommendations: Given quick improvement in LFTs, will rechallenge with posaconazole. Discussed with pharmacy. All azoles carry some risk of hepatotoxicity so options are limits. Patient has medication assistance approved for posaconazole for 1 year. Will monitor LFTs at follow up. F/u hep C RNA, also checking hep B DNA.  Follow up outpatient with ID.   Assessment: 52 yo M with a pmhx of polysubstance abuse, fungemia, osteomyelitis and discitis presenting with transaminitis concerning for hepatoxocity associated with posaconazole use.  Also with positive Hep C ab and Hep B core IgM,  possible acute hep B infection.   Antibiotics: IV Eraxis >> stop  Resume PO posaconazole   HPI: [name] is a 52 y.o. male with a pmhx of HTN, dCHF, polysubstance abuse recently admitted in October of 2018 for candidemia, osteomyelitis / discitis of T11-T12, and possible endocarditis. He was treated with Eraxis and discharged on high dose fluconazole 800mg  daily. He was later transitioned to posaconazole at ID follow up. Since then, he has had multiple ED visits and hospitalization related to this and his uncontrolled back pain.  There has been documented concern for opioid seeking behavior. He presented to the ED again yesterday 1/21 with shortness of breath after running out of his home albuterol inhaler. On arrival he was afebrile and hemodynamically stable. Lab work up was notable for transaminitis with AST 481 and ALT 356 up from 64 and 84 respectively one week prior. CXR revealed fibrosis of the lungs, but no active disease. Also with previously positive Hep C Ab, HCA RNA quant pending. 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 UDS positive for cocaine and benzos.   Of note, patient had a repeat MRI of his thoracic and lumber spine on 06/06/17 that demonstrated similar appearance of persistent discitis / osteomyelitis of his thoracic spine without evidence of progression. Repeat fungal cultures during prior hospitalization on 05/24/17 were negative.  Interval History: LFTs improved since admission, AST 481 -> 175, ALT 356 -> 300. Patient very upset about not getting pain medication while hospitalized. Currently denies back pain but says he is hurting because of his cough. Threatening to leave AMA if not given pain medication. Repeatedly stating that he has oxy 20 mg at home. Per chart review, it appears his opioids were being tapered per PCP, last seen on 06/03/16 and decreased to percocet 10-325 q8h. He was also started on gabapentin.   Review of Systems: All pertinents listed in HPI.  All other systems reviewed and are negative    Past Medical History:  Diagnosis Date  . CHF (congestive heart failure) ([place])   . Chronic bronchitis ([place])   . Chronic lower back  pain   . COPD (chronic obstructive pulmonary disease) ([place])   . Diskitis   . Panic attacks   . Tobacco abuse     Social History   Tobacco Use  . Smoking status: Current Every Day Smoker    Packs/day: 0.50    Years: 35.00    Pack years: 17.50    Types: Cigarettes  . Smokeless tobacco: Former Systems developer    Types: Chew  Substance Use Topics  . Alcohol use: Yes      Comment: Daily. Heavy. ; Daily. 1-2 grams a day. ; 02/26/2017 "none in the last week"  . Drug use: Yes    Types: Cocaine    Comment: Daily. 1-2 grams a day. ; 02/26/2017 "none in the last week"    Family History  Problem Relation Age of Onset  . Hypertension Mother   . Cancer Other   . Stroke Other   . Coronary artery disease Other     Allergies  Allergen Reactions  . Penicillins     From childhood: Has patient had a PCN reaction causing immediate rash, facial/tongue/throat swelling, SOB or lightheadedness with hypotension: Unknown Has patient had a PCN reaction causing severe rash involving mucus membranes or skin necrosis: Unknown Has patient had a PCN reaction that required hospitalization: Unknown Has patient had a PCN reaction occurring within the last 10 years: No If all of the above answers are "NO", then may proceed with Cephalosporin use.     Physical Exam:  Vitals:   06/11/17 0502 06/11/17 1005  BP: (!) 144/85   Pulse: (!) 57   Resp: 18   Temp: 98.4 F (36.9 C)   SpO2: 96% 95%   Constitutional: NAD, appears comfortable HEENT: Atraumatic, normocephalic. PERRL, anicteric sclera.  Neck: Supple, trachea midline.  Cardiovascular: RRR, no murmurs, rubs, or gallops.  Pulmonary/Chest: CTAB, no wheezes, rales, or rhonchi.   Extremities: Warm and well perfused. No edema.  Neurological: A&Ox3, CN II - XII grossly intact.  Skin: No rashes or erythema  Psychiatric: Normal mood and affect   Lab Results  Component Value Date   WBC 4.2 06/10/2017   HGB 13.2 06/10/2017   HCT 38.2 (L) 06/10/2017   MCV 97.4 06/10/2017   PLT 244 06/10/2017    Lab Results  Component Value Date   CREATININE 0.64 06/11/2017   BUN 10 06/11/2017   NA 136 06/11/2017   K 3.8 06/11/2017   CL 100 (L) 06/11/2017   CO2 26 06/11/2017    Lab Results  Component Value Date   ALT 300 (H) 06/11/2017   AST 175 (H) 06/11/2017   ALKPHOS 79 06/11/2017     Microbiology: No results found for  this or any previous visit (from the past 240 hour(s)).  Randall Ochs, MD - [facility] [email or web address] [phone] pager  [phone] cell 06/11/2017, 12:42 PM

## 2017-06-11 NOTE — Care Management Note (Signed)
Case Management Note  Patient Details  Name: Jasyn Mey MRN: [id number] Date of Birth: 10-30-1965  Subjective/Objective:    Fungal osteomyelitis, Vertebral osteomyelitis              Action/Plan: Spoke to pt and states he was approved to receive Noxafil for one year for free. He utilized the [facility] clinic for medications. Pt has an appt on 1/24 at [facility]. Pt states he is ready to dc home. Notified attending.   PCP Domenica Fail PA  Expected Discharge Date:                  Expected Discharge Plan:  Home/Self Care  In-House Referral:  NA  Discharge planning Services  CM Consult  Post Acute Care Choice:  NA Choice offered to:  NA  DME Arranged:  N/A DME Agency:  NA  HH Arranged:  NA HH Agency:  NA  Status of Service:  Completed, signed off  If discussed at [place] of Stay Meetings, dates discussed:    Additional Comments:  Erenest Rasher, RN 06/11/2017, 2:29 PM
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
## 2017-06-11 NOTE — Progress Notes (Addendum)
CSW received phone call about transportation for pt back home. Pt was transported from [facility] to here.   Pt stated he cannot afford a taxi back to his house.   CSW called Surveyor, quantity and informed him of the situation. A.D. Approved taxi voucher for pt to get back home.  CSW provided taxi voucher to pt's nurse for when pt is taken out to the taxi.   Wendelyn Breslow, [facility]  [phone]

## 2017-06-12 ENCOUNTER — Telehealth (INDEPENDENT_AMBULATORY_CARE_PROVIDER_SITE_OTHER): Payer: Self-pay

## 2017-06-12 LAB — HCV RNA QUANT
HCV QUANT LOG: 6.943 {Log_IU}/mL (ref >1.70)
HCV Quantitative: 8770000 IU/mL (ref >50)

## 2017-06-12 NOTE — Telephone Encounter (Signed)
-----   Message from Clent Demark, PA-C sent at 06/12/2017  1:37 PM EST ----- Please alert patient to hepatitis C infection. He will need to have his blood drawn for Hep A and Hep B.

## 2017-06-12 NOTE — Telephone Encounter (Signed)
Called patient, his phone rang several times and then disconnected. Will attempt to call once more before mailing results. Nat Christen, CMA

## 2017-06-13 ENCOUNTER — Encounter (INDEPENDENT_AMBULATORY_CARE_PROVIDER_SITE_OTHER): Payer: Self-pay

## 2017-06-13 ENCOUNTER — Ambulatory Visit (INDEPENDENT_AMBULATORY_CARE_PROVIDER_SITE_OTHER): Payer: Self-pay | Admitting: Physician Assistant

## 2017-06-13 ENCOUNTER — Telehealth (INDEPENDENT_AMBULATORY_CARE_PROVIDER_SITE_OTHER): Payer: Self-pay

## 2017-06-13 NOTE — Telephone Encounter (Signed)
-----   Message from Clent Demark, PA-C sent at 06/12/2017  1:37 PM EST ----- Please alert patient to hepatitis C infection. He will need to have his blood drawn for Hep A and Hep B.

## 2017-06-13 NOTE — Telephone Encounter (Signed)
Called patients phone, it rings several times then disconnects. Will mail results. Nat Christen, CMA

## 2017-06-26 ENCOUNTER — Ambulatory Visit: Payer: Self-pay | Admitting: Cardiology

## 2017-06-28 ENCOUNTER — Ambulatory Visit (INDEPENDENT_AMBULATORY_CARE_PROVIDER_SITE_OTHER): Payer: Self-pay | Admitting: Physician Assistant

## 2017-07-01 ENCOUNTER — Encounter: Payer: Self-pay | Admitting: Cardiology

## 2017-07-02 ENCOUNTER — Inpatient Hospital Stay: Payer: Self-pay | Admitting: Internal Medicine

## 2017-07-05 DIAGNOSIS — I10 Essential (primary) hypertension: Secondary | ICD-10-CM

## 2017-07-11 ENCOUNTER — Telehealth (INDEPENDENT_AMBULATORY_CARE_PROVIDER_SITE_OTHER): Payer: Self-pay | Admitting: Physician Assistant

## 2017-07-11 NOTE — Telephone Encounter (Signed)
Please advise on patients no show rate and needing medical attention.

## 2017-07-11 NOTE — Telephone Encounter (Signed)
Pt called to try to speak with the nurse or the pcp, since he knows that he miss way to many appt, need to talk to someone, please follow up

## 2017-07-12 NOTE — Telephone Encounter (Signed)
I called Pt, left VM informed him that the provider need to see him before he can give him any more refill and to make an appt with [place] an also if he miss the appt again he will be dismiss from the practice

## 2017-07-12 NOTE — Telephone Encounter (Signed)
Make final appointment. If no show, he will be dismissed.

## 2017-10-18 ENCOUNTER — Telehealth: Payer: Self-pay | Admitting: Pharmacist

## 2017-10-18 NOTE — Telephone Encounter (Signed)
Spoke to patient on 10/18/17. Pt requesting refills for blood pressure medication. I'm unsure as to which medications he is needing because he hasn't picked anything up from [place] since 05/2017.  I told the patient I would reach out to his doctor. Will forward information to Zelda/Roger.  Pt gave this number for contact: [place] [place] [facility]  [phone]

## 2017-10-20 NOTE — Telephone Encounter (Signed)
Denied. Thank you. He can discuss this with his provider on 11-11-2017

## 2017-10-21 NOTE — Telephone Encounter (Signed)
Pt needs an office visit

## 2017-10-21 NOTE — Telephone Encounter (Signed)
Called patient to inform that he will need to wait until his OV on 6/24, but no answer

## 2017-10-22 NOTE — Telephone Encounter (Signed)
Patients mother is aware and will inform patient. Nat Christen, CMA

## 2017-11-11 ENCOUNTER — Ambulatory Visit (INDEPENDENT_AMBULATORY_CARE_PROVIDER_SITE_OTHER): Payer: Self-pay | Admitting: Physician Assistant

## 2017-11-11 ENCOUNTER — Other Ambulatory Visit: Payer: Self-pay

## 2017-11-11 ENCOUNTER — Encounter (INDEPENDENT_AMBULATORY_CARE_PROVIDER_SITE_OTHER): Payer: Self-pay | Admitting: Physician Assistant

## 2017-11-11 VITALS — BP 179/103 | HR 66 | Temp 98.1°F | Ht 68.0 in | Wt 192.2 lb

## 2017-11-11 DIAGNOSIS — Z23 Encounter for immunization: Secondary | ICD-10-CM

## 2017-11-11 DIAGNOSIS — M5441 Lumbago with sciatica, right side: Secondary | ICD-10-CM

## 2017-11-11 DIAGNOSIS — M5442 Lumbago with sciatica, left side: Secondary | ICD-10-CM

## 2017-11-11 DIAGNOSIS — G8929 Other chronic pain: Secondary | ICD-10-CM

## 2017-11-11 DIAGNOSIS — I10 Essential (primary) hypertension: Secondary | ICD-10-CM

## 2017-11-11 DIAGNOSIS — R7303 Prediabetes: Secondary | ICD-10-CM

## 2017-11-11 DIAGNOSIS — F1129 Opioid dependence with unspecified opioid-induced disorder: Secondary | ICD-10-CM

## 2017-11-11 LAB — POCT GLYCOSYLATED HEMOGLOBIN (HGB A1C): Hemoglobin A1C: 5.5 % (ref 4.0–5.6)

## 2017-11-11 MED ORDER — HYDROCHLOROTHIAZIDE 12.5 MG PO TABS: 12.5000 mg | ORAL_TABLET | Freq: Every day | ORAL | 5 refills | Status: DC

## 2017-11-11 MED ORDER — LISINOPRIL 20 MG PO TABS: 20.0000 mg | ORAL_TABLET | Freq: Every day | ORAL | 5 refills | Status: DC

## 2017-11-11 NOTE — Progress Notes (Signed)
Subjective:  Patient ID: [name], male    DOB: 05/21/1966  Age: 52 y.o. MRN: [id number]  CC:   HPI Randall Georgiou Johnsonis a 52 y.o.malewith a medical history of CHF, chronic bronchitis, chronic lower back pain, COPD, pre-diabetes, discitis, fungal osteomyelitis, panic attacks, heroin use, and tobacco abuse presentsfor management of HTN. Last BP 129/83 mmHg five months ago. Had been taking carvedilol 6.25 mg BID as directed until he ran out approximately three months ago. Patient had been lost to follow up due to my refusal to continue prescribing opioid pain medication for back pain 2/2 fungal osteomyelitis. Last UDS was positive for cocaine and benzodiazepines. UDS negative for opiates. Pt had been going to infectious disease but was also lost to follow up. Michela Pitcher he went to [place] to have "someone else take care of me". Took Suboxone from a friend which helped him reduce his pain. Still has severe chronic lower back pain.       Outpatient Medications Prior to Visit  Medication Sig Dispense Refill  . posaconazole (NOXAFIL) 100 MG TBEC delayed-release tablet Take 3 tablets (300 mg total) daily by mouth. 42 tablet 11  . lisinopril (PRINIVIL,ZESTRIL) 20 MG tablet Take 1 tablet (20 mg total) by mouth daily. (Patient not taking: Reported on 11/11/2017) 30 tablet 0  . polyethylene glycol (MIRALAX / GLYCOLAX) packet Take 17 g by mouth daily. (Patient not taking: Reported on 11/11/2017) 14 each 0  . oxyCODONE-acetaminophen (PERCOCET) 10-325 MG tablet Take 1 tablet by mouth every 8 (eight) hours as needed. for pain  0   No facility-administered medications prior to visit.      ROS Review of Systems  Constitutional: Negative for chills, fever and malaise/fatigue.  Eyes: Negative for blurred vision.  Respiratory: Negative for shortness of breath.   Cardiovascular: Negative for chest pain and palpitations.  Gastrointestinal: Negative for abdominal pain and nausea.  Genitourinary:  Negative for dysuria and hematuria.  Musculoskeletal: Negative for joint pain and myalgias.  Skin: Negative for rash.  Neurological: Negative for tingling and headaches.  Psychiatric/Behavioral: Negative for depression. The patient is not nervous/anxious.     Objective:  BP (!) 179/103 (BP Location: Left Arm, Patient Position: Sitting, Cuff Size: Normal)   Pulse 66   Temp 98.1 F (36.7 C) (Oral)   Ht 5\' 8"  (1.727 m)   Wt 192 lb 3.2 oz (87.2 kg)   SpO2 95%   BMI 29.22 kg/m   BP/Weight 11/11/2017 06/11/2017 05/21/7508  Systolic BP 258 527 -  Diastolic BP 782 95 -  Wt. (Lbs) 192.2 - 190  BMI 29.22 - 28.89  Some encounter information is confidential and restricted. Go to Review Flowsheets activity to see all data.      Physical Exam  Constitutional: He is oriented to person, place, and time.  Well developed, well nourished, NAD, polite  HENT:  Head: Normocephalic and atraumatic.  Eyes: No scleral icterus.  Neck: Normal range of motion. Neck supple. No thyromegaly present.  Cardiovascular: Normal rate, regular rhythm and normal heart sounds.  Pulmonary/Chest: Effort normal and breath sounds normal.  Musculoskeletal: He exhibits no edema.  Pt with severely limited aROM of the lower back with all planes of motion.   Neurological: He is alert and oriented to person, place, and time.  Skin: Skin is warm and dry. No rash noted. No erythema. No pallor.  Psychiatric: He has a normal mood and affect. His behavior is normal. Thought content normal.  Vitals reviewed.    Assessment &  Plan:   1. Hypertension, unspecified type - Begin hydrochlorothiazide (HYDRODIURIL) 12.5 MG tablet; Take 1 tablet (12.5 mg total) by mouth daily.  Dispense: 30 tablet; Refill: 5 - Refill lisinopril (PRINIVIL,ZESTRIL) 20 MG tablet; Take 1 tablet (20 mg total) by mouth daily.  Dispense: 30 tablet; Refill: 5 - Comprehensive metabolic panel  2. Chronic midline low back pain with bilateral sciatica - Will  refer to orthopedics once CAFA application is complete.   3. Prediabetes - HgB A1c 5.5% in clinic today. Down from 6.3%.   4. Need for Tdap vaccination - Tdap vaccine greater than or equal to 7yo IM   Meds ordered this encounter  Medications  . hydrochlorothiazide (HYDRODIURIL) 12.5 MG tablet    Sig: Take 1 tablet (12.5 mg total) by mouth daily.    Dispense:  30 tablet    Refill:  5    Order Specific Question:   Supervising Provider    Answer:   Charlott Rakes [[id number]]  . lisinopril (PRINIVIL,ZESTRIL) 20 MG tablet    Sig: Take 1 tablet (20 mg total) by mouth daily.    Dispense:  30 tablet    Refill:  5    Order Specific Question:   Supervising Provider    Answer:   Charlott Rakes [[id number]]    Follow-up: Return in about 1 month (around 12/09/2017) for HTN.   Clent Demark PA

## 2017-11-11 NOTE — Patient Instructions (Addendum)
Td Vaccine (Tetanus and Diphtheria): What You Need to Know 1. Why get vaccinated? Tetanus  and diphtheria are very serious diseases. They are rare in the [place] today, but people who do become infected often have severe complications. Td vaccine is used to protect adolescents and adults from both of these diseases. Both tetanus and diphtheria are infections caused by bacteria. Diphtheria spreads from person to person through coughing or sneezing. Tetanus-causing bacteria enter the body through cuts, scratches, or wounds. TETANUS (lockjaw) causes painful muscle tightening and stiffness, usually all over the body.  It can lead to tightening of muscles in the head and neck so you can't open your mouth, swallow, or sometimes even breathe. Tetanus kills about 1 out of every 10 people who are infected even after receiving the best medical care.  DIPHTHERIA can cause a thick coating to form in the back of the throat.  It can lead to breathing problems, paralysis, heart failure, and death.  Before vaccines, as many as 200,000 cases of diphtheria and hundreds of cases of tetanus were reported in the [place] each year. Since vaccination began, reports of cases for both diseases have dropped by about 99%. 2. Td vaccine Td vaccine can protect adolescents and adults from tetanus and diphtheria. Td is usually given as a booster dose every 10 years but it can also be given earlier after a severe and dirty wound or burn. Another vaccine, called Tdap, which protects against pertussis in addition to tetanus and diphtheria, is sometimes recommended instead of Td vaccine. Your doctor or the person giving you the vaccine can give you more information. Td may safely be given at the same time as other vaccines. 3. Some people should not get this vaccine  A person who has ever had a life-threatening allergic reaction after a previous dose of any tetanus or diphtheria containing vaccine, OR has a severe  allergy to any part of this vaccine, should not get Td vaccine. Tell the person giving the vaccine about any severe allergies.  Talk to your doctor if you: ? had severe pain or swelling after any vaccine containing diphtheria or tetanus, ? ever had a condition called Guillain Barre Syndrome (GBS), ? aren't feeling well on the day the shot is scheduled. 4. What are the risks from Td vaccine? With any medicine, including vaccines, there is a chance of side effects. These are usually mild and go away on their own. Serious reactions are also possible but are rare. Most people who get Td vaccine do not have any problems with it. Mild problems following Td vaccine: (Did not interfere with activities)  Pain where the shot was given (about 8 people in 10)  Redness or swelling where the shot was given (about 1 person in 4)  Mild fever (rare)  Headache (about 1 person in 4)  Tiredness (about 1 person in 4)  Moderate problems following Td vaccine: (Interfered with activities, but did not require medical attention)  Fever over 102F (rare)  Severe problems following Td vaccine: (Unable to perform usual activities; required medical attention)  Swelling, severe pain, bleeding and/or redness in the arm where the shot was given (rare).  Problems that could happen after any vaccine:  People sometimes faint after a medical procedure, including vaccination. Sitting or lying down for about 15 minutes can help prevent fainting, and injuries caused by a fall. Tell your doctor if you feel dizzy, or have vision changes or ringing in the ears.  Some people get   severe pain in the shoulder and have difficulty moving the arm where a shot was given. This happens very rarely.  Any medication can cause a severe allergic reaction. Such reactions from a vaccine are very rare, estimated at fewer than 1 in a million doses, and would happen within a few minutes to a few hours after the vaccination. As with any  medicine, there is a very remote chance of a vaccine causing a serious injury or death. The safety of vaccines is always being monitored. For more information, visit: http://www.aguilar.org/ 5. What if there is a serious reaction? What should I look for? Look for anything that concerns you, such as signs of a severe allergic reaction, very high fever, or unusual behavior. Signs of a severe allergic reaction can include hives, swelling of the face and throat, difficulty breathing, a fast heartbeat, dizziness, and weakness. These would usually start a few minutes to a few hours after the vaccination. What should I do?  If you think it is a severe allergic reaction or other emergency that can't wait, call 9-1-1 or get the person to the nearest hospital. Otherwise, call your doctor.  Afterward, the reaction should be reported to the Vaccine Adverse Event Reporting System (VAERS). Your doctor might file this report, or you can do it yourself through the VAERS web site at www.vaers.SamedayNews.es, or by calling 225-385-6354. ? VAERS does not give medical advice. 6. The National Vaccine Injury Compensation Program The [employer] Vaccine Injury Compensation Program (VICP) is a federal program that was created to compensate people who may have been injured by certain vaccines. Persons who believe they may have been injured by a vaccine can learn about the program and about filing a claim by calling (785)095-2355 or visiting the [employer] website at GoldCloset.com.ee. There is a time limit to file a claim for compensation. 7. How can I learn more?  Ask your doctor. He or she can give you the vaccine package insert or suggest other sources of information.  Call your local or state health department.  Contact the Centers for Disease Control and Prevention (CDC): ? Call 505-580-7880 (1-800-CDC-INFO) ? Visit CDC's website at http://hunter.com/ CDC Td Vaccine VIS (08/30/15) This information is  not intended to replace advice given to you by your health care provider. Make sure you discuss any questions you have with your health care provider. Document Released: 03/04/2006 Document Revised: 01/26/2016 Document Reviewed: 01/26/2016 Elsevier Interactive Patient Education  2017 [place].    Hypertension Hypertension is another name for high blood pressure. High blood pressure forces your heart to work harder to pump blood. This can cause problems over time. There are two numbers in a blood pressure reading. There is a top number (systolic) over a bottom number (diastolic). It is best to have a blood pressure below 120/80. Healthy choices can help lower your blood pressure. You may need medicine to help lower your blood pressure if:  Your blood pressure cannot be lowered with healthy choices.  Your blood pressure is higher than 130/80.  Follow these instructions at home: Eating and drinking  If directed, follow the DASH eating plan. This diet includes: ? Filling half of your plate at each meal with fruits and vegetables. ? Filling one quarter of your plate at each meal with whole grains. Whole grains include whole wheat pasta, brown rice, and whole grain bread. ? Eating or drinking low-fat dairy products, such as skim milk or low-fat yogurt. ? Filling one quarter of your plate at each  meal with low-fat (lean) proteins. Low-fat proteins include fish, skinless chicken, eggs, beans, and tofu. ? Avoiding fatty meat, cured and processed meat, or chicken with skin. ? Avoiding premade or processed food.  Eat less than 1,500 mg of salt (sodium) a day.  Limit alcohol use to no more than 1 drink a day for nonpregnant women and 2 drinks a day for men. One drink equals 12 oz of beer, 5 oz of wine, or 1 oz of hard liquor. Lifestyle  Work with your doctor to stay at a healthy weight or to lose weight. Ask your doctor what the best weight is for you.  Get at least 30 minutes of exercise  that causes your heart to beat faster (aerobic exercise) most days of the week. This may include walking, swimming, or biking.  Get at least 30 minutes of exercise that strengthens your muscles (resistance exercise) at least 3 days a week. This may include lifting weights or pilates.  Do not use any products that contain nicotine or tobacco. This includes cigarettes and e-cigarettes. If you need help quitting, ask your doctor.  Check your blood pressure at home as told by your doctor.  Keep all follow-up visits as told by your doctor. This is important. Medicines  Take over-the-counter and prescription medicines only as told by your doctor. Follow directions carefully.  Do not skip doses of blood pressure medicine. The medicine does not work as well if you skip doses. Skipping doses also puts you at risk for problems.  Ask your doctor about side effects or reactions to medicines that you should watch for. Contact a doctor if:  You think you are having a reaction to the medicine you are taking.  You have headaches that keep coming back (recurring).  You feel dizzy.  You have swelling in your ankles.  You have trouble with your vision. Get help right away if:  You get a very bad headache.  You start to feel confused.  You feel weak or numb.  You feel faint.  You get very bad pain in your: ? Chest. ? Belly (abdomen).  You throw up (vomit) more than once.  You have trouble breathing. Summary  Hypertension is another name for high blood pressure.  Making healthy choices can help lower blood pressure. If your blood pressure cannot be controlled with healthy choices, you may need to take medicine. This information is not intended to replace advice given to you by your health care provider. Make sure you discuss any questions you have with your health care provider. Document Released: 10/24/2007 Document Revised: 04/04/2016 Document Reviewed: 04/04/2016 Elsevier Interactive  Patient Education  [employer].

## 2017-11-12 LAB — COMPREHENSIVE METABOLIC PANEL
ALT: 138 IU/L — ABNORMAL HIGH (ref 0–44)
AST: 86 IU/L — AB (ref 0–40)
Albumin/Globulin Ratio: 1.2 (ref 1.2–2.2)
Albumin: 4.5 g/dL (ref 3.5–5.5)
Alkaline Phosphatase: 89 IU/L (ref 39–117)
BILIRUBIN TOTAL: 0.8 mg/dL (ref 0.0–1.2)
BUN/Creatinine Ratio: 15 (ref 9–20)
BUN: 13 mg/dL (ref 6–24)
CALCIUM: 10 mg/dL (ref 8.7–10.2)
CHLORIDE: 102 mmol/L (ref 96–106)
CO2: 23 mmol/L (ref 20–29)
Creatinine, Ser: 0.84 mg/dL (ref 0.76–1.27)
GFR, EST AFRICAN AMERICAN: 116 mL/min/{1.73_m2} (ref >59)
GFR, EST NON AFRICAN AMERICAN: 100 mL/min/{1.73_m2} (ref >59)
GLOBULIN, TOTAL: 3.8 g/dL (ref 1.5–4.5)
Glucose: 97 mg/dL (ref 65–99)
Potassium: 3.9 mmol/L (ref 3.5–5.2)
Sodium: 140 mmol/L (ref 134–144)
Total Protein: 8.3 g/dL (ref 6.0–8.5)

## 2017-11-13 ENCOUNTER — Telehealth (INDEPENDENT_AMBULATORY_CARE_PROVIDER_SITE_OTHER): Payer: Self-pay

## 2017-11-13 NOTE — Telephone Encounter (Signed)
Called the number on file and received error message that call can not be completed as dialed, please hang up and try call your again. Called temporary number on file, it is to the [facility] in  [place]. Phone rang for several minutes with no front desk associate answering. Will try calling patient again. Nat Christen, CMA

## 2017-11-13 NOTE — Telephone Encounter (Signed)
-----   Message from Clent Demark, PA-C sent at 11/12/2017 10:41 AM EDT ----- Liver enzymes are elevated but better than previous. He may discuss his hepatitis with his infectious disease doctor.

## 2017-11-27 ENCOUNTER — Telehealth (INDEPENDENT_AMBULATORY_CARE_PROVIDER_SITE_OTHER): Payer: Self-pay

## 2017-11-27 NOTE — Telephone Encounter (Signed)
-----   Message from Clent Demark, PA-C sent at 11/12/2017 10:41 AM EDT ----- Liver enzymes are elevated but better than previous. He may discuss his hepatitis with his infectious disease doctor.

## 2017-11-27 NOTE — Telephone Encounter (Signed)
Patient is now aware that liver enzymes are elevated but better than previous. Discuss hepatitis with infectious disease doctor. Nat Christen, CMA

## 2017-12-10 IMAGING — MR MR THORACIC SPINE WO/W CM
5 of 9 series · 24 of 48 positions shown · IV contrast (multihance)
Comparison: Prior MRI from 03/07/2017.

CLINICAL DATA: Initial evaluation for mid back/ thoracic spine
pain. The T8 weight with the medial mean pre pituitary

EXAM:
MRI THORACIC WITHOUT AND WITH CONTRAST
TECHNIQUE: Multiplanar and multiecho pulse sequences of the thoracic spine were
obtained without and with intravenous contrast.
CONTRAST:  20mL MULTIHANCE GADOBENATE DIMEGLUMINE 529 MG/ML IV SOLN

[Series 5: T2 · sagittal · 3.0mm · 0.64mm/px · 2 of 15 slices shown (1 of 2)]
[im 1/15]
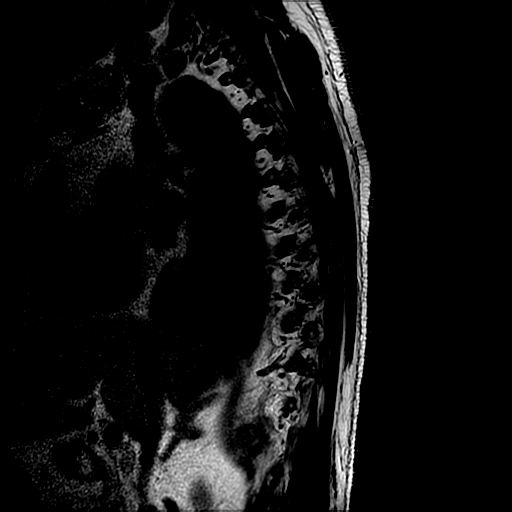
[im 15/15]
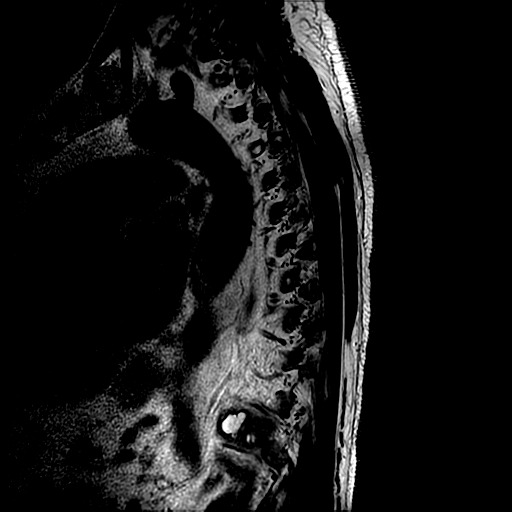

[Series 7: T1 · sagittal · 3.0mm · 0.64mm/px · 3 of 15 slices shown (1 of 2)]
[im 1/15]
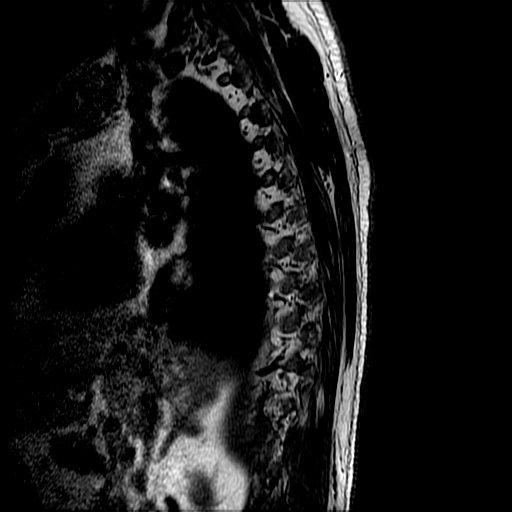
[im 8/15]
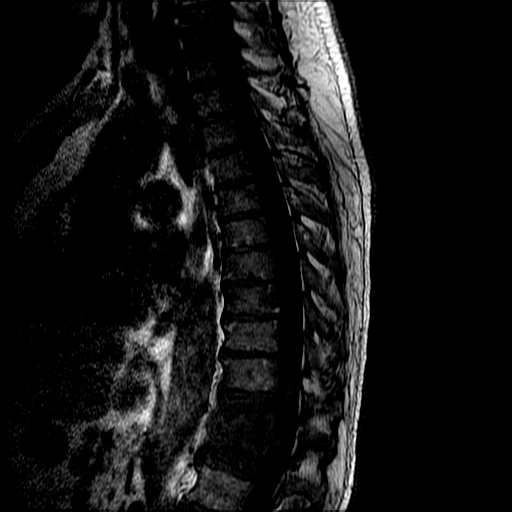
[im 15/15]
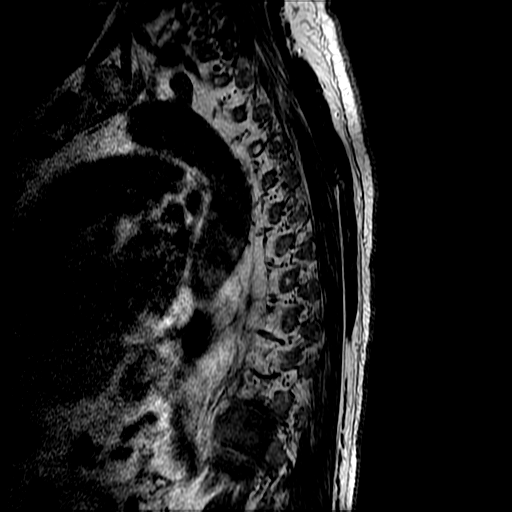

[Series 8: T2 · axial · 4.0mm · 0.43mm/px · z∈[-336,-99]mm · 9 of 43 slices shown (2 of 2)]
[im 1/43]
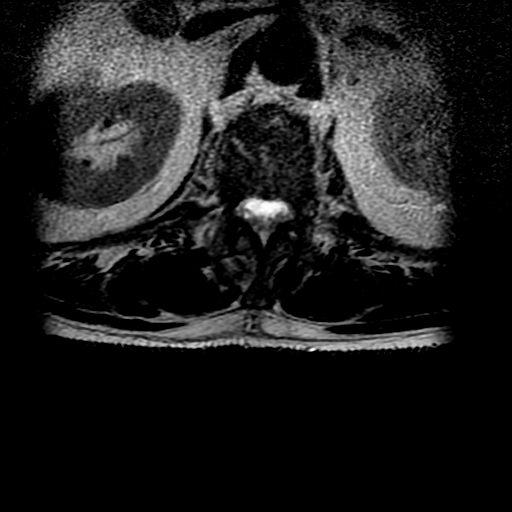
[im 6/43]
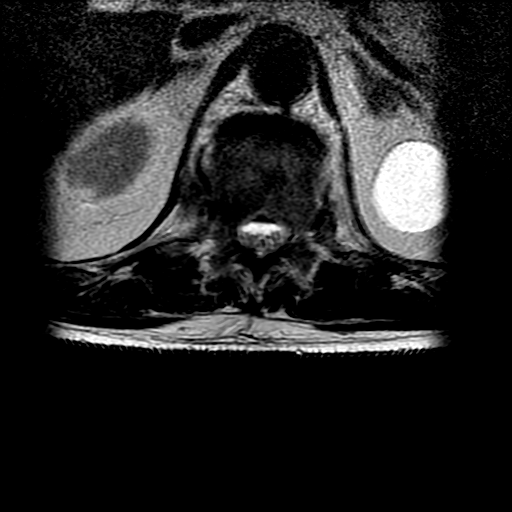
[im 11/43]
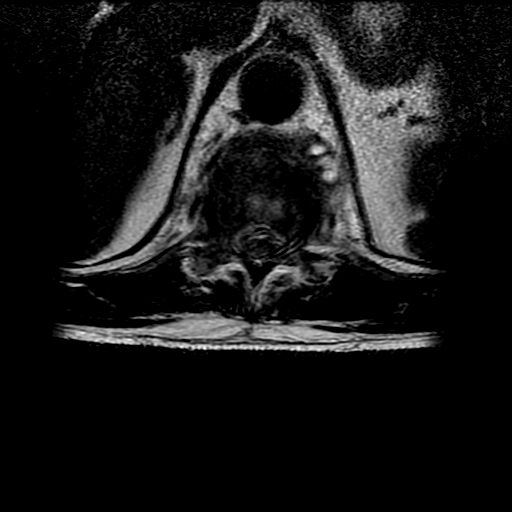
[im 16/43]
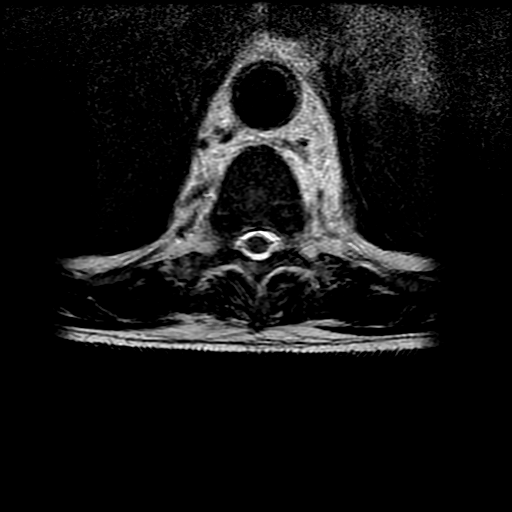
[im 22/43]
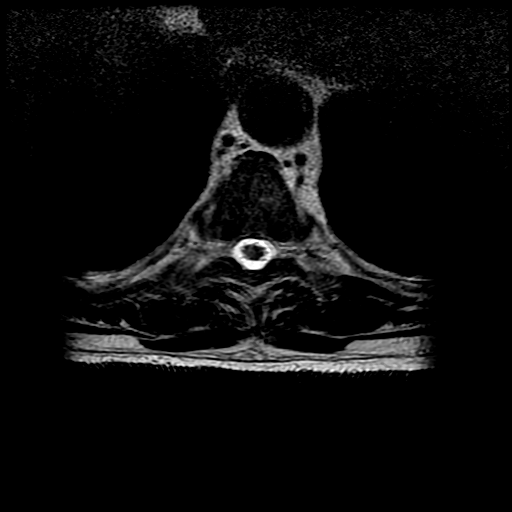
[im 27/43]
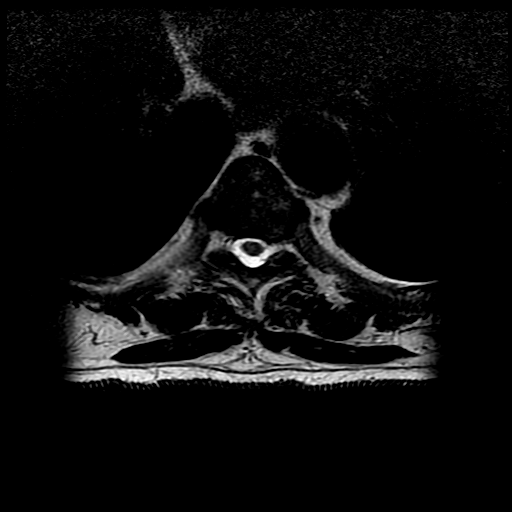
[im 32/43]
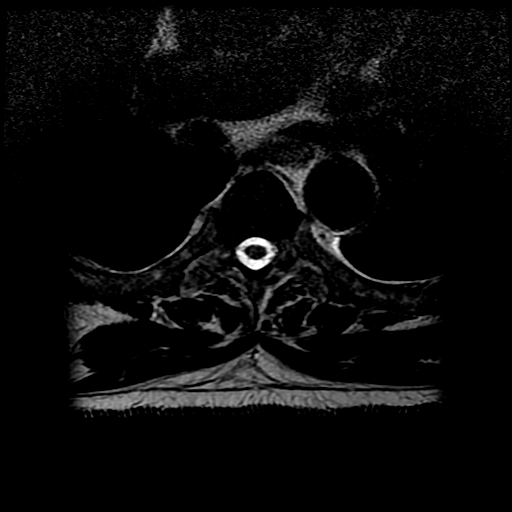
[im 37/43]
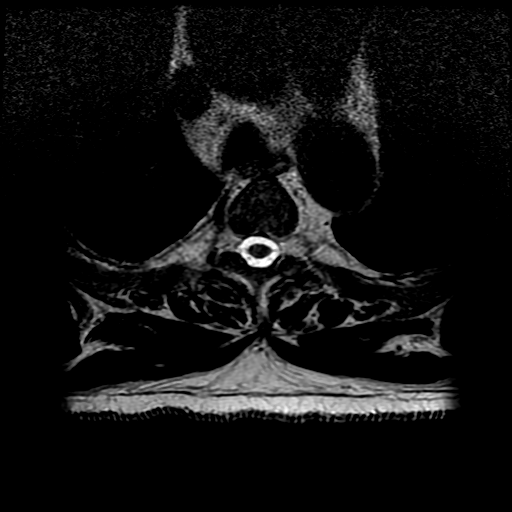
[im 43/43]
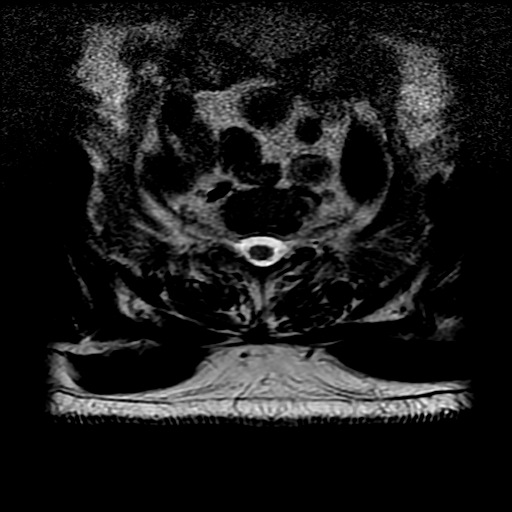

[Series 10: T1 · axial · non-contrast · 4.0mm · 0.86mm/px · z∈[-336,-99]mm · 9 of 43 slices shown (2 of 2)]
[im 1/43]
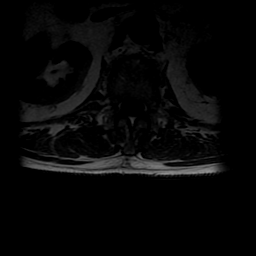
[im 6/43]
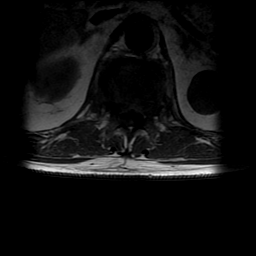
[im 11/43]
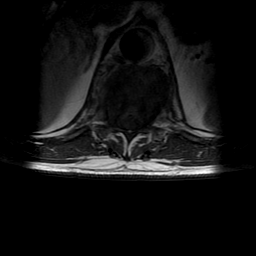
[im 16/43]
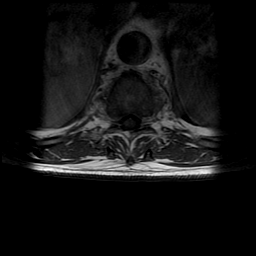
[im 22/43]
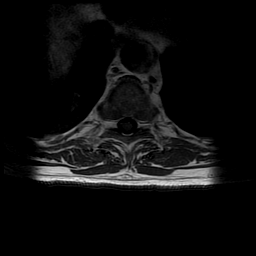
[im 27/43]
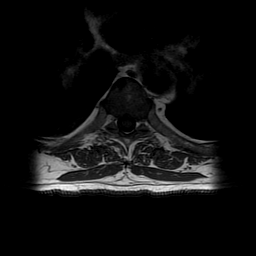
[im 32/43]
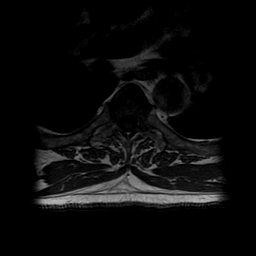
[im 37/43]
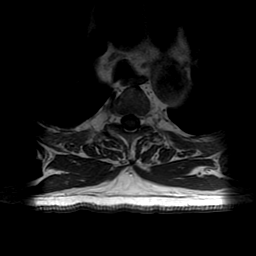
[im 43/43]
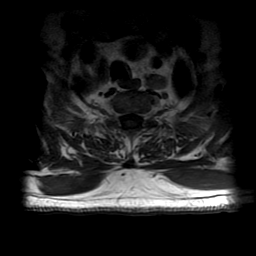

[Series 11: T1 post-contrast · sagittal · 3.0mm · 0.64mm/px · 1 of 15 slices shown]
[im 1/15]
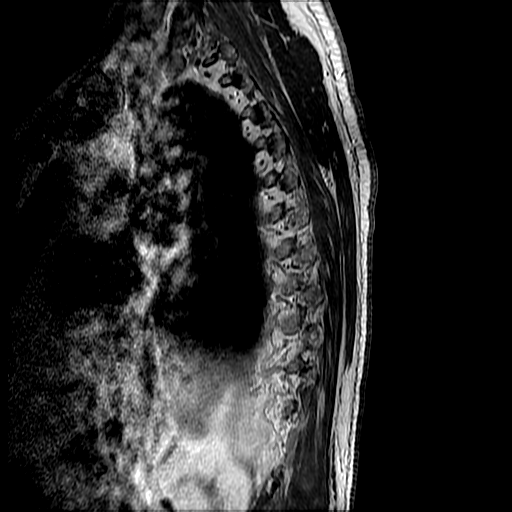

[24 of 48 positions shown; findings below may reference images not displayed]

FINDINGS: MRI THORACIC SPINE FINDINGS

Alignment: Stable alignment with preservation of the normal thoracic
kyphosis. No listhesis.

Vertebrae: Changes related to on joint osteomyelitis discitis again
seen about the T11-12 interspace. Abnormal edema and enhancement
seen throughout the T11 and T12 vertebral bodies with extension into
the posterior elements. Abnormal fluid within the T11-12 disc space
with associated endplate erosion, irregularity, and enhancement,
relatively similar to previous. Associated disc space collapse with
mild vertebral body height loss relatively stable. Ventral epidural
enhancement extending from T10-11 to the inferior aspect of T12 is
relatively stable. Small amount of fluid extends posteriorly into
the ventral epidural space from the infected T11-12 disc space
(series 12, image 36). No other discernible rim enhancing epidural
collection. Been dorsal epidural enhancement extends from
approximately the T9 level to T11-12.

Cord: Probable faint T2 hyperintensity within the distal cord at the
level of T12 is relatively stable from previous. Cord signal
otherwise normal.

Paraspinal and other soft tissues: Paravertebral soft tissue
inflammation/phlegmon extending from T9-10 through T12-L1.
Superimposed 19 mm collection along the left aspect of the T11-12
interspace appears increased in size from previous. Few additional
scattered subcentimeter collections noted along the right aspect of
the infected T11-12 disc space. Trace layering bilateral pleural
effusions. Left renal cyst again noted. 11 mm lesion positioned
within the subcutaneous fat at the left upper back at the level of
T2 likely reflects a sebaceous cyst, stable.

Disc levels:

Ventral epidural phlegmon at T11-12 with resultant moderate spinal
stenosis, slightly worsened from previous, with the thecal sac now
measuring 5 mm in AP diameter, previously approximately 7 mm).
Secondary flattening of the thoracic spinal cord. Additional disc
bulging extending from T[DATE]-T10-11 is unchanged.
IMPRESSION: 1. Persistent changes consistent with osteomyelitis discitis at
T11-12 with associated epidural phlegmon. Resultant moderate spinal
stenosis is slightly worsened relative to recent MRI, with similar
mild edema in the distal spinal cord.
2. Increased size of 19 mm paraspinous collection positioned at the
left aspect of the T11-12 interspace.

## 2017-12-12 ENCOUNTER — Ambulatory Visit (INDEPENDENT_AMBULATORY_CARE_PROVIDER_SITE_OTHER): Payer: Self-pay | Admitting: Physician Assistant

## 2018-02-18 ENCOUNTER — Telehealth: Payer: Self-pay | Admitting: *Deleted

## 2018-02-18 NOTE — Telephone Encounter (Signed)
Not sure what else we can do. Perhaps mail him a letter suggesting he establish care with [facility] in [place]. Maybe he will have medicaid there?

## 2018-02-18 NOTE — Telephone Encounter (Signed)
Diane from [employer] patient assistance called to let [facility] know that the patient's year of assistance will expire 03/20/2018.  This is a courtesy call to see if he will be continuing on posaconazole.  Per chart review, patient has not been in clinic for 1 year, did not follow up as scheduled.  Diane noted in her chart that the patient updated his address to [place] in March 2019.  He last received a shipment of posaconazole 01/02/2018. Mulitple numbers on his profile at [employer] are disconnected - [employer] has no way to contact patient. Landis Gandy, RN

## 2018-02-18 NOTE — Telephone Encounter (Signed)
[employer] did not believe they had good contact information for the patient. They said he has their number (he requests refills through them).

## 2018-03-18 ENCOUNTER — Emergency Department (HOSPITAL_COMMUNITY)
Admission: EM | Admit: 2018-03-18 | Discharge: 2018-03-18 | Disposition: A | Discharge status: Home / Self Care | Payer: Medicaid Other | Attending: Emergency Medicine | Admitting: Emergency Medicine

## 2018-03-18 ENCOUNTER — Emergency Department (HOSPITAL_COMMUNITY): Payer: Medicaid Other

## 2018-03-18 ENCOUNTER — Encounter (HOSPITAL_COMMUNITY): Payer: Self-pay | Admitting: Emergency Medicine

## 2018-03-18 DIAGNOSIS — I110 Hypertensive heart disease with heart failure: Secondary | ICD-10-CM | POA: Insufficient documentation

## 2018-03-18 DIAGNOSIS — R519 Headache, unspecified: Secondary | ICD-10-CM

## 2018-03-18 DIAGNOSIS — R05 Cough: Secondary | ICD-10-CM | POA: Diagnosis present

## 2018-03-18 DIAGNOSIS — Z9114 Patient's other noncompliance with medication regimen: Secondary | ICD-10-CM | POA: Diagnosis not present

## 2018-03-18 DIAGNOSIS — I5032 Chronic diastolic (congestive) heart failure: Secondary | ICD-10-CM | POA: Diagnosis not present

## 2018-03-18 DIAGNOSIS — R030 Elevated blood-pressure reading, without diagnosis of hypertension: Secondary | ICD-10-CM

## 2018-03-18 DIAGNOSIS — R51 Headache: Secondary | ICD-10-CM | POA: Diagnosis not present

## 2018-03-18 DIAGNOSIS — Z79899 Other long term (current) drug therapy: Secondary | ICD-10-CM | POA: Diagnosis not present

## 2018-03-18 DIAGNOSIS — F17210 Nicotine dependence, cigarettes, uncomplicated: Secondary | ICD-10-CM | POA: Insufficient documentation

## 2018-03-18 DIAGNOSIS — J449 Chronic obstructive pulmonary disease, unspecified: Secondary | ICD-10-CM | POA: Insufficient documentation

## 2018-03-18 DIAGNOSIS — R059 Cough, unspecified: Secondary | ICD-10-CM

## 2018-03-18 LAB — COMPREHENSIVE METABOLIC PANEL
ALBUMIN: 3.9 g/dL (ref 3.5–5.0)
ALT: 63 U/L — ABNORMAL HIGH (ref 0–44)
ANION GAP: 7 (ref 5–15)
AST: 47 U/L — AB (ref 15–41)
Alkaline Phosphatase: 64 U/L (ref 38–126)
BUN: 11 mg/dL (ref 6–20)
CHLORIDE: 106 mmol/L (ref 98–111)
CO2: 28 mmol/L (ref 22–32)
Calcium: 8.9 mg/dL (ref 8.9–10.3)
Creatinine, Ser: 0.78 mg/dL (ref 0.61–1.24)
GFR calc Af Amer: >60 mL/min (ref >60)
GFR calc non Af Amer: >60 mL/min (ref >60)
GLUCOSE: 97 mg/dL (ref 70–99)
POTASSIUM: 3.2 mmol/L — AB (ref 3.5–5.1)
SODIUM: 141 mmol/L (ref 135–145)
Total Bilirubin: 0.7 mg/dL (ref 0.3–1.2)
Total Protein: 7.6 g/dL (ref 6.5–8.1)

## 2018-03-18 LAB — CBC WITH DIFFERENTIAL/PLATELET
Abs Immature Granulocytes: 0.02 10*3/uL (ref 0.00–0.07)
BASOS PCT: 0 %
Basophils Absolute: 0 10*3/uL (ref 0.0–0.1)
EOS PCT: 4 %
Eosinophils Absolute: 0.2 10*3/uL (ref 0.0–0.5)
HCT: 41.2 % (ref 39.0–52.0)
Hemoglobin: 14.1 g/dL (ref 13.0–17.0)
Immature Granulocytes: 0 %
Lymphocytes Relative: 47 %
Lymphs Abs: 2.3 10*3/uL (ref 0.7–4.0)
MCH: 36 pg — AB (ref 26.0–34.0)
MCHC: 34.2 g/dL (ref 30.0–36.0)
MCV: 105.1 fL — AB (ref 80.0–100.0)
MONO ABS: 0.4 10*3/uL (ref 0.1–1.0)
Monocytes Relative: 8 %
Neutro Abs: 2 10*3/uL (ref 1.7–7.7)
Neutrophils Relative %: 41 %
Platelets: 188 10*3/uL (ref 150–400)
RBC: 3.92 MIL/uL — AB (ref 4.22–5.81)
RDW: 14.4 % (ref 11.5–15.5)
WBC: 4.9 10*3/uL (ref 4.0–10.5)
nRBC: 0 % (ref 0.0–0.2)

## 2018-03-18 LAB — RAPID URINE DRUG SCREEN, HOSP PERFORMED
AMPHETAMINES: NOT DETECTED
BARBITURATES: NOT DETECTED
BENZODIAZEPINES: NOT DETECTED
COCAINE: POSITIVE — AB
Opiates: NOT DETECTED
TETRAHYDROCANNABINOL: NOT DETECTED

## 2018-03-18 LAB — ETHANOL: Alcohol, Ethyl (B): <10 mg/dL (ref <10)

## 2018-03-18 LAB — ACETAMINOPHEN LEVEL: Acetaminophen (Tylenol), Serum: <10 ug/mL — ABNORMAL LOW (ref 10–30)

## 2018-03-18 LAB — C-REACTIVE PROTEIN

## 2018-03-18 LAB — SEDIMENTATION RATE: SED RATE: 3 mm/h (ref 0–16)

## 2018-03-18 LAB — SALICYLATE LEVEL: Salicylate Lvl: <7 mg/dL (ref 2.8–30.0)

## 2018-03-18 MED ORDER — ACETAMINOPHEN 325 MG PO TABS
650.0000 mg | ORAL_TABLET | Freq: Once | ORAL | Status: AC
  Administered 2018-03-18: 650 mg via ORAL
  Filled 2018-03-18: qty 2

## 2018-03-18 NOTE — ED Triage Notes (Signed)
Patient here from home with complaints of cough and headache that started while going into [employer] custody. Hx of hypertension.

## 2018-03-18 NOTE — Discharge Instructions (Addendum)
Please return to the Emergency Department for any new or worsening symptoms or if your symptoms do not improve. Please be sure to follow up with your Primary Care Physician as soon as possible regarding your visit today. If you do not have a Primary Doctor please use the resources below to establish one. Please try to stop smoking to help with your chronic cough. Please drink plenty water and get plenty of rest to help with your headache. Your blood pressure was elevated today.  Please follow-up with your primary care doctor for blood pressure recheck and medication management.  Contact a health care provider if: Your symptoms are not helped by medicine. You have a headache that is different from the usual headache. You have nausea or you vomit. You have a fever. Get help right away if: Your headache becomes severe. You have repeated vomiting. You have a stiff neck. You have a loss of vision. You have problems with speech. You have pain in the eye or ear. You have muscular weakness or loss of muscle control. You lose your balance or have trouble walking. You feel faint or pass out. You have confusion. Contact a health care provider if: You have new symptoms. You cough up pus. Your cough does not get better after 2-3 weeks, or your cough gets worse. You cannot control your cough with suppressant medicines and you are losing sleep. You develop pain that is getting worse or pain that is not controlled with pain medicines. You have a fever. You have unexplained weight loss. You have night sweats. Get help right away if: You cough up blood. You have difficulty breathing. Your heartbeat is very fast.  Do not take your medicine if  develop an itchy rash, swelling in your mouth or lips, or difficulty breathing.   RESOURCE GUIDE  Chronic Pain Problems: Contact [facility]  [phone] Patients need to be referred by their primary care doctor.  Insufficient Money  for Medicine: Contact United Way:  call "211" or [place] [phone].  No Primary Care Doctor: Call [facility]  [phone] - can help you locate a primary care doctor that  accepts your insurance, provides certain services, etc. Physician Referral Service- [phone]  Agencies that provide inexpensive medical care: [facility]  [facility]  [phone] [facility]  [phone] [facility]  [phone] [facility]  [phone] [facility]  [phone]  [place] Providers: [facility]- [address], [address]  [phone], Mon-Fri 9am-7pm, Sat 9am-1pm [place], [address] [place]  [place], [address] [place]  [place]- [address]  [place], [address], [phone]  Only accepts [place] patients after they have their name  applied to their card  Self Pay (no insurance) in [facility]: Sickle Cell Patients: Dr Kevan Ny, [facility]  [place], [facility]- [place]  [facility]- [address], [facility]- see information above (Speak to [employer] if you do not have insurance)       -  [facility]- [place]  [address] [place],  [place]       -  [place], [place]  Dr Vista Lawman-  [address], [address], [place], [facility]- [address] [phone]       -  [facility] [place]- [address], [place], also [address], [phone]       -    [facility]- [address], [place], 1st & 3rd  Saturday   every month, 10am-1pm  1) Find a Doctor and Pay Out of Pocket Although you won't have to find out who is covered by your insurance plan, it is a good idea to ask around and get recommendations. You will then need to call the office and see if the doctor you have chosen will accept you as a new patient and what types of options they offer for patients who are self-pay. Some doctors offer discounts or will set up payment plans for their patients who do not have insurance, but you will need to ask so you aren't surprised when you get to your appointment.  2) Contact Your Local Health Department Not all health departments have doctors that can see patients for sick visits, but many do, so it is worth a call to see if yours does. If you don't know where your local health department is, you can check in your phone book. The CDC also has a tool to help you locate your state's health department, and many state websites also have listings of all of their local health departments.  3) Find a [place] [facility] If your illness is not likely to be very severe or complicated, you may want to try a walk in clinic. These are popping up all over the country in pharmacies, drugstores, and shopping centers. They're usually staffed by nurse practitioners or physician assistants that have been trained to treat common illnesses and complaints. They're usually fairly quick and inexpensive. However, if you have serious medical issues or chronic medical problems, these are probably not your best option  [facility] [place], [facility], [address], [place], phone [phone] or [phone].  Monday - Friday, call for an appointment. [place], [facility], [address], [place], phone [phone] or [phone].  Monday - Friday, call for an appointment.  Abuse/Neglect: [place] [phone] [place] [phone] (After Hours)  Emergency Shelter:  [facility] [phone]  Maternity Homes: [facility] [place] [phone] [place] [phone]  MRSA Hotline #:   214-845-3690  [facility]. [address] [place]  [facility] Phone:  [phone]                                  Phone:  [phone]                   Phone:  [phone]  [facility], [place]- [phone]       -     [facility] in [place], [address]                                  [phone], [place] [phone] or [phone] (After Hours)   [place]  Substance Abuse Resources: [facility]  [phone] [place] [phone] [facility] [place] [phone] [address] [phone] Residential & Outpatient Substance Abuse Program  [phone]  Psychological Services: [place]  [phone] [place]  [place], [place]. [address], [place], [place]: [phone] or [phone], [employer]  Dental Assistance  If unable to pay or uninsured, contact:  [facility] or [facility]. to become qualified for the adult dental clinic.  Patients with Medicaid: [facility] [phone] [address], [place] [address], [phone]  If unable to pay, or uninsured, contact [facility] [phone]) or [place] [phone] in  [place], [place] in [facility]) to become qualified for the adult dental clinic   Other [place]- [place], [place], [place], [address], [place], [place], 2nd and 4th Thursday of the month at 6:30am.  10 clients each day by appointment, can sometimes see walk-in patients if someone does not show for an appointment. [facility]- [address] [place], [place], [address], [place], [place], [place], [address], [place] Department- [phone] [place] [facility] [phone]

## 2018-03-18 NOTE — ED Notes (Signed)
Bed: WLPT4 Expected date:  Expected time:  Means of arrival:  Comments: 
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
## 2018-04-09 ENCOUNTER — Emergency Department (HOSPITAL_COMMUNITY)
Admission: EM | Admit: 2018-04-09 | Discharge: 2018-04-09 | Payer: Self-pay | Attending: Emergency Medicine | Admitting: Emergency Medicine

## 2018-04-09 ENCOUNTER — Encounter (HOSPITAL_COMMUNITY): Payer: Self-pay

## 2018-04-09 DIAGNOSIS — Z79899 Other long term (current) drug therapy: Secondary | ICD-10-CM | POA: Insufficient documentation

## 2018-04-09 DIAGNOSIS — Z760 Encounter for issue of repeat prescription: Secondary | ICD-10-CM | POA: Insufficient documentation

## 2018-04-09 DIAGNOSIS — J449 Chronic obstructive pulmonary disease, unspecified: Secondary | ICD-10-CM | POA: Insufficient documentation

## 2018-04-09 DIAGNOSIS — F17210 Nicotine dependence, cigarettes, uncomplicated: Secondary | ICD-10-CM | POA: Insufficient documentation

## 2018-04-09 DIAGNOSIS — I110 Hypertensive heart disease with heart failure: Secondary | ICD-10-CM | POA: Insufficient documentation

## 2018-04-09 DIAGNOSIS — I10 Essential (primary) hypertension: Secondary | ICD-10-CM

## 2018-04-09 DIAGNOSIS — I5032 Chronic diastolic (congestive) heart failure: Secondary | ICD-10-CM | POA: Insufficient documentation

## 2018-04-09 LAB — URINALYSIS, ROUTINE W REFLEX MICROSCOPIC
Bacteria, UA: NONE SEEN
Bilirubin Urine: NEGATIVE
GLUCOSE, UA: NEGATIVE mg/dL
KETONES UR: NEGATIVE mg/dL
LEUKOCYTES UA: NEGATIVE
Nitrite: NEGATIVE
PH: 7 (ref 5.0–8.0)
PROTEIN: NEGATIVE mg/dL
Specific Gravity, Urine: 1.004 — ABNORMAL LOW (ref 1.005–1.030)

## 2018-04-09 LAB — COMPREHENSIVE METABOLIC PANEL
ALK PHOS: 69 U/L (ref 38–126)
ALT: 111 U/L — AB (ref 0–44)
AST: 53 U/L — ABNORMAL HIGH (ref 15–41)
Albumin: 3.9 g/dL (ref 3.5–5.0)
Anion gap: 6 (ref 5–15)
BILIRUBIN TOTAL: 0.7 mg/dL (ref 0.3–1.2)
BUN: 10 mg/dL (ref 6–20)
CALCIUM: 8.9 mg/dL (ref 8.9–10.3)
CO2: 27 mmol/L (ref 22–32)
CREATININE: 0.79 mg/dL (ref 0.61–1.24)
Chloride: 104 mmol/L (ref 98–111)
GFR calc Af Amer: >60 mL/min (ref >60)
Glucose, Bld: 132 mg/dL — ABNORMAL HIGH (ref 70–99)
Potassium: 4.1 mmol/L (ref 3.5–5.1)
Sodium: 137 mmol/L (ref 135–145)
TOTAL PROTEIN: 7.9 g/dL (ref 6.5–8.1)

## 2018-04-09 LAB — CBC WITH DIFFERENTIAL/PLATELET
Abs Immature Granulocytes: 0.01 10*3/uL (ref 0.00–0.07)
BASOS ABS: 0 10*3/uL (ref 0.0–0.1)
BASOS PCT: 0 %
EOS PCT: 5 %
Eosinophils Absolute: 0.3 10*3/uL (ref 0.0–0.5)
HEMATOCRIT: 41.2 % (ref 39.0–52.0)
Hemoglobin: 14.6 g/dL (ref 13.0–17.0)
Immature Granulocytes: 0 %
LYMPHS PCT: 38 %
Lymphs Abs: 2 10*3/uL (ref 0.7–4.0)
MCH: 35.7 pg — ABNORMAL HIGH (ref 26.0–34.0)
MCHC: 35.4 g/dL (ref 30.0–36.0)
MCV: 100.7 fL — AB (ref 80.0–100.0)
MONO ABS: 0.5 10*3/uL (ref 0.1–1.0)
Monocytes Relative: 9 %
NEUTROS ABS: 2.4 10*3/uL (ref 1.7–7.7)
NRBC: 0 % (ref 0.0–0.2)
Neutrophils Relative %: 48 %
Platelets: 143 10*3/uL — ABNORMAL LOW (ref 150–400)
RBC: 4.09 MIL/uL — AB (ref 4.22–5.81)
RDW: 12.4 % (ref 11.5–15.5)
WBC: 5.2 10*3/uL (ref 4.0–10.5)

## 2018-04-09 LAB — RAPID URINE DRUG SCREEN, HOSP PERFORMED
AMPHETAMINES: NOT DETECTED
BARBITURATES: NOT DETECTED
BENZODIAZEPINES: NOT DETECTED
Cocaine: NOT DETECTED
Opiates: NOT DETECTED
Tetrahydrocannabinol: NOT DETECTED

## 2018-04-09 LAB — LIPASE, BLOOD: LIPASE: 35 U/L (ref 11–51)

## 2018-04-09 MED ORDER — HYDROCHLOROTHIAZIDE 12.5 MG PO TABS: 12.5000 mg | ORAL_TABLET | Freq: Every day | ORAL | 1 refills | Status: DC

## 2018-04-09 MED ORDER — POSACONAZOLE 100 MG PO TBEC: 300.0000 mg | DELAYED_RELEASE_TABLET | Freq: Every day | ORAL | 1 refills | Status: DC

## 2018-04-09 MED ORDER — LISINOPRIL 20 MG PO TABS: 20.0000 mg | ORAL_TABLET | Freq: Every day | ORAL | 1 refills | Status: DC

## 2018-04-09 MED ORDER — KETOROLAC TROMETHAMINE 30 MG/ML IJ SOLN
15.0000 mg | Freq: Once | INTRAMUSCULAR | Status: AC
  Administered 2018-04-09: 15 mg via INTRAVENOUS
  Filled 2018-04-09: qty 1

## 2018-04-09 NOTE — ED Triage Notes (Signed)
Pt has been in jail for three weeks and hasn't had any of his medications including blood pressure and for his infection in his back, he states that he started having lower back pain today and it feels the same as it did when he originally got the infection He did receive 0.2mg  clonidine about 1830 prior to coming

## 2018-04-09 NOTE — Discharge Instructions (Signed)
Please return for any problem.  Follow-up with your regular care providers as instructed.  It is imperative that you continue to take your routine medications.  I have re-prescribed them tonight. Failure to take your regularly prescribed medications may result in significant illness. Please inform the medical staff at your facility of the importance of you continuing your routine medications during your incarceration.
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
## 2018-06-02 ENCOUNTER — Telehealth: Payer: Self-pay

## 2018-06-02 NOTE — Telephone Encounter (Signed)
[facility] calling for office visit with Dr Tommy Medal.  Release sent and received.   Note faxed to : [phone]   Laverle Patter, RN

## 2018-07-08 ENCOUNTER — Ambulatory Visit: Payer: Self-pay | Admitting: Infectious Diseases

## 2018-09-05 ENCOUNTER — Ambulatory Visit: Payer: Self-pay | Admitting: Nurse Practitioner

## 2018-12-29 ENCOUNTER — Ambulatory Visit (INDEPENDENT_AMBULATORY_CARE_PROVIDER_SITE_OTHER): Payer: Self-pay | Admitting: Primary Care

## 2019-01-05 ENCOUNTER — Ambulatory Visit (INDEPENDENT_AMBULATORY_CARE_PROVIDER_SITE_OTHER): Payer: Medicaid Other | Admitting: Primary Care

## 2019-01-05 ENCOUNTER — Other Ambulatory Visit: Payer: Self-pay

## 2019-01-05 DIAGNOSIS — R059 Cough, unspecified: Secondary | ICD-10-CM

## 2019-01-05 DIAGNOSIS — F411 Generalized anxiety disorder: Secondary | ICD-10-CM | POA: Diagnosis not present

## 2019-01-05 DIAGNOSIS — Z760 Encounter for issue of repeat prescription: Secondary | ICD-10-CM

## 2019-01-05 DIAGNOSIS — I10 Essential (primary) hypertension: Secondary | ICD-10-CM | POA: Diagnosis not present

## 2019-01-05 DIAGNOSIS — M5442 Lumbago with sciatica, left side: Secondary | ICD-10-CM

## 2019-01-05 DIAGNOSIS — M5441 Lumbago with sciatica, right side: Secondary | ICD-10-CM | POA: Diagnosis not present

## 2019-01-05 DIAGNOSIS — R05 Cough: Secondary | ICD-10-CM

## 2019-01-05 DIAGNOSIS — F17200 Nicotine dependence, unspecified, uncomplicated: Secondary | ICD-10-CM

## 2019-01-05 DIAGNOSIS — J301 Allergic rhinitis due to pollen: Secondary | ICD-10-CM

## 2019-01-05 DIAGNOSIS — G8929 Other chronic pain: Secondary | ICD-10-CM

## 2019-01-05 MED ORDER — BUSPIRONE HCL 7.5 MG PO TABS: 7.5000 mg | ORAL_TABLET | Freq: Three times a day (TID) | ORAL | 0 refills | Status: DC

## 2019-01-05 MED ORDER — LORATADINE 10 MG PO TABS: 10.0000 mg | ORAL_TABLET | Freq: Every day | ORAL | 11 refills | Status: DC

## 2019-01-05 MED ORDER — DM-GUAIFENESIN ER 30-600 MG PO TB12: 1.0000 | ORAL_TABLET | Freq: Two times a day (BID) | ORAL | 1 refills | Status: DC

## 2019-01-05 MED ORDER — FLUTICASONE PROPIONATE 50 MCG/ACT NA SUSP: 2.0000 | Freq: Every day | NASAL | 1 refills | Status: DC

## 2019-01-05 MED ORDER — HYDROCHLOROTHIAZIDE 12.5 MG PO TABS: 12.5000 mg | ORAL_TABLET | Freq: Every day | ORAL | 3 refills | Status: DC

## 2019-01-05 MED ORDER — CLONIDINE HCL 0.2 MG PO TABS: 0.2000 mg | ORAL_TABLET | Freq: Every day | ORAL | 3 refills | Status: DC

## 2019-01-05 MED ORDER — LISINOPRIL 20 MG PO TABS: 20.0000 mg | ORAL_TABLET | Freq: Every day | ORAL | 3 refills | Status: DC

## 2019-01-05 NOTE — Progress Notes (Signed)
Virtual Visit via Telephone Note  I connected with [name] on 01/05/19 at  2:30 PM EDT by telephone and verified that I am speaking with the correct person using two identifiers.   I discussed the limitations, risks, security and privacy concerns of performing an evaluation and management service by telephone and the availability of in person appointments. I also discussed with the patient that there may be a patient responsible charge related to this service. The patient expressed understanding and agreed to proceed.  TELE History of Present Illness: [name] is being seen for constant back pain does not feel his back has an infection- denies fever, chill , warmth or swelling present. On feet all day makes the pain worst. 10/10. Tried everything over the counter for pain but nothing worked. He stated dilaudid helped and suboxone.  Medicaton refill.   Past Medical History:  Diagnosis Date  . CHF (congestive heart failure) ([place])   . Chronic bronchitis ([place])   . Chronic lower back pain   . COPD (chronic obstructive pulmonary disease) ([place])   . Diskitis   . Panic attacks   . Tobacco abuse    Observations/Objective: Review of Systems  Respiratory: Positive for shortness of breath.   Musculoskeletal: Positive for back pain.  Psychiatric/Behavioral: The patient is nervous/anxious.    Assessment and Plan: Tiron was seen today for establish care.  Diagnoses and all orders for this visit:  Generalized anxiety disorder  We discussed options for treatment of anxiety including therapy and/or medication.  Will check basic labs to ensure thyroid is in normal range and that no other metabolic issues are obvious.  Reviewed concept of anxiety as biochemical imbalance of neurotransmitters and rationale for treatment. Discussed potential risks, expected benefits, possible side effects of the medicine. We also discussed how to take it correctly and dosing instructions. If he  has any significant side effects to the medicine, he is to stop it and call for advice.  Instructed patient to contact office or on-call physician promptly should condition worsen or any new symptoms appear.    He was agreeable with this plan.    pathophysiology, etiology, risks, and principles of treatment.  -     Ambulatory referral to Psychiatry -     busPIRone (BUSPAR) 7.5 MG tablet; Take 1 tablet (7.5 mg total) by mouth 3 (three) times daily.  Chronic midline low back pain with bilateral sciatica Work on losing weight to help reduce back pain. May alternate with heat and ice application for pain relief. May also alternate with acetaminophen and Ibuprofen as prescribed pain relief. Other alternatives include massage, acupuncture and water aerobics.  You must stay active and avoid a sedentary lifestyle. -     Ambulatory referral to Pain Clinic  Hypertension, unspecified type Counseled on blood pressure goal of less than 130/80, low-sodium, DASH diet, medication compliance, 150 minutes of moderate intensity exercise per week. Discussed medication compliance, adverse effects. -     hydrochlorothiazide (HYDRODIURIL) 12.5 MG tablet; Take 1 tablet (12.5 mg total) by mouth daily. -     lisinopril (ZESTRIL) 20 MG tablet; Take 1 tablet (20 mg total) by mouth daily.  Medication refill -     cloNIDine (CATAPRES) 0.2 MG tablet; Take 1 tablet (0.2 mg total) by mouth daily. -     hydrochlorothiazide (HYDRODIURIL) 12.5 MG tablet; Take 1 tablet (12.5 mg total) by mouth daily.  Cough Cough variant asthma, upper airway cough syndrome (previously termed postnasal drip syndrome) or GERD,  although many studies of chronic cough show pts have more than one mechanism -     dextromethorphan-guaiFENesin (MUCINEX DM) 30-600 MG 12hr tablet; Take 1 tablet by mouth 2 (two) times daily.  Seasonal allergic rhinitis due to pollen  Use Flonase nasal spray for at least duration of your allergy season.  - For  appropriate administration of the nasal spray, clear the nose, use opposite hand for opposite nare, sniff gently, exhale through your mouth. - For maximal effect take these two nasal sprays at least 30 minutes apart. - Continue Allegra, Claritin or Zyrtec each day, as needed. - Drink at least 64 ounces of water each day. - If you have a humidifier use it nightly. - Remove as many irritants/allergies as you are able to, no pets in the bedroom, change air filters in air vents. -     fluticasone (FLONASE) 50 MCG/ACT nasal spray; Place 2 sprays into both nostrils daily. -     loratadine (CLARITIN) 10 MG tablet; Take 1 tablet (10 mg total) by mouth daily. -     dextromethorphan-guaiFENesin (MUCINEX DM) 30-600 MG 12hr tablet; Take 1 tablet by mouth 2 (two) times daily.  Current smoker Nicotine affect every organ in the body second leading cause of death.  Increased risk for lung cancer and other respiratory diseases recommend cessation.  This will be reminded at each clinical visit.   Follow Up Instructions:    I discussed the assessment and treatment plan with the patient. The patient was provided an opportunity to ask questions and all were answered. The patient agreed with the plan and demonstrated an understanding of the instructions.   The patient was advised to call back or seek an in-person evaluation if the symptoms worsen or if the condition fails to improve as anticipated.  I provided 38 minutes of non-face-to-face time during this encounter.   [name], NP

## 2019-01-05 NOTE — Progress Notes (Signed)
[facility] Med Refill and back pain   Pain referral

## 2019-03-30 ENCOUNTER — Encounter (HOSPITAL_COMMUNITY): Payer: Self-pay | Admitting: Emergency Medicine

## 2019-03-30 ENCOUNTER — Emergency Department (HOSPITAL_COMMUNITY): Payer: Medicaid Other

## 2019-03-30 ENCOUNTER — Emergency Department (HOSPITAL_COMMUNITY)
Admission: EM | Admit: 2019-03-30 | Discharge: 2019-03-30 | Disposition: A | Discharge status: Home / Self Care | Payer: Medicaid Other | Attending: Emergency Medicine | Admitting: Emergency Medicine

## 2019-03-30 ENCOUNTER — Other Ambulatory Visit: Payer: Self-pay

## 2019-03-30 DIAGNOSIS — J441 Chronic obstructive pulmonary disease with (acute) exacerbation: Secondary | ICD-10-CM | POA: Insufficient documentation

## 2019-03-30 DIAGNOSIS — Z79899 Other long term (current) drug therapy: Secondary | ICD-10-CM | POA: Diagnosis not present

## 2019-03-30 DIAGNOSIS — Z20828 Contact with and (suspected) exposure to other viral communicable diseases: Secondary | ICD-10-CM | POA: Insufficient documentation

## 2019-03-30 DIAGNOSIS — I110 Hypertensive heart disease with heart failure: Secondary | ICD-10-CM | POA: Insufficient documentation

## 2019-03-30 DIAGNOSIS — R0602 Shortness of breath: Secondary | ICD-10-CM | POA: Diagnosis present

## 2019-03-30 DIAGNOSIS — F17210 Nicotine dependence, cigarettes, uncomplicated: Secondary | ICD-10-CM | POA: Diagnosis not present

## 2019-03-30 DIAGNOSIS — I5032 Chronic diastolic (congestive) heart failure: Secondary | ICD-10-CM | POA: Diagnosis not present

## 2019-03-30 HISTORY — DX: Essential (primary) hypertension: I10

## 2019-03-30 LAB — COMPREHENSIVE METABOLIC PANEL
ALT: 132 U/L — ABNORMAL HIGH (ref 0–44)
AST: 87 U/L — ABNORMAL HIGH (ref 15–41)
Albumin: 4 g/dL (ref 3.5–5.0)
Alkaline Phosphatase: 73 U/L (ref 38–126)
Anion gap: 9 (ref 5–15)
BUN: 12 mg/dL (ref 6–20)
CO2: 29 mmol/L (ref 22–32)
Calcium: 8.7 mg/dL — ABNORMAL LOW (ref 8.9–10.3)
Chloride: 98 mmol/L (ref 98–111)
Creatinine, Ser: 0.93 mg/dL (ref 0.61–1.24)
GFR calc Af Amer: >60 mL/min (ref >60)
GFR calc non Af Amer: >60 mL/min (ref >60)
Glucose, Bld: 134 mg/dL — ABNORMAL HIGH (ref 70–99)
Potassium: 3.1 mmol/L — ABNORMAL LOW (ref 3.5–5.1)
Sodium: 136 mmol/L (ref 135–145)
Total Bilirubin: 1.2 mg/dL (ref 0.3–1.2)
Total Protein: 8.5 g/dL — ABNORMAL HIGH (ref 6.5–8.1)

## 2019-03-30 LAB — CBC WITH DIFFERENTIAL/PLATELET
Abs Immature Granulocytes: 0.04 10*3/uL (ref 0.00–0.07)
Basophils Absolute: 0 10*3/uL (ref 0.0–0.1)
Basophils Relative: 0 %
Eosinophils Absolute: 0.2 10*3/uL (ref 0.0–0.5)
Eosinophils Relative: 3 %
HCT: 50.6 % (ref 39.0–52.0)
Hemoglobin: 17 g/dL (ref 13.0–17.0)
Immature Granulocytes: 1 %
Lymphocytes Relative: 33 %
Lymphs Abs: 1.8 10*3/uL (ref 0.7–4.0)
MCH: 36.2 pg — ABNORMAL HIGH (ref 26.0–34.0)
MCHC: 33.6 g/dL (ref 30.0–36.0)
MCV: 107.7 fL — ABNORMAL HIGH (ref 80.0–100.0)
Monocytes Absolute: 0.4 10*3/uL (ref 0.1–1.0)
Monocytes Relative: 7 %
Neutro Abs: 3.2 10*3/uL (ref 1.7–7.7)
Neutrophils Relative %: 56 %
Platelets: 187 10*3/uL (ref 150–400)
RBC: 4.7 MIL/uL (ref 4.22–5.81)
RDW: 13.4 % (ref 11.5–15.5)
WBC: 5.6 10*3/uL (ref 4.0–10.5)
nRBC: 0 % (ref 0.0–0.2)

## 2019-03-30 LAB — SARS CORONAVIRUS 2 (TAT 6-24 HRS): SARS Coronavirus 2: NEGATIVE

## 2019-03-30 LAB — BRAIN NATRIURETIC PEPTIDE: B Natriuretic Peptide: 118 pg/mL — ABNORMAL HIGH (ref 0.0–100.0)

## 2019-03-30 MED ORDER — METHYLPREDNISOLONE SODIUM SUCC 125 MG IJ SOLR
125.0000 mg | Freq: Once | INTRAMUSCULAR | Status: AC
  Administered 2019-03-30: 09:00:00 125 mg via INTRAVENOUS
  Filled 2019-03-30: qty 2

## 2019-03-30 MED ORDER — PREDNISONE 10 MG PO TABS: 20.0000 mg | ORAL_TABLET | Freq: Every day | ORAL | 0 refills | Status: DC

## 2019-03-30 MED ORDER — ALBUTEROL SULFATE HFA 108 (90 BASE) MCG/ACT IN AERS: 6.0000 | INHALATION_SPRAY | RESPIRATORY_TRACT | Status: DC | PRN

## 2019-03-30 MED ORDER — SULFAMETHOXAZOLE-TRIMETHOPRIM 800-160 MG PO TABS: 1.0000 | ORAL_TABLET | Freq: Two times a day (BID) | ORAL | 0 refills | Status: AC

## 2019-03-30 MED ORDER — MAGNESIUM SULFATE 2 GM/50ML IV SOLN
2.0000 g | Freq: Once | INTRAVENOUS | Status: AC
  Administered 2019-03-30: 2 g via INTRAVENOUS
  Filled 2019-03-30: qty 50

## 2019-03-30 MED ORDER — POTASSIUM CHLORIDE CRYS ER 20 MEQ PO TBCR
40.0000 meq | EXTENDED_RELEASE_TABLET | Freq: Once | ORAL | Status: AC
  Administered 2019-03-30: 11:00:00 40 meq via ORAL
  Filled 2019-03-30: qty 2

## 2019-03-30 MED ORDER — ALBUTEROL SULFATE HFA 108 (90 BASE) MCG/ACT IN AERS: 2.0000 | INHALATION_SPRAY | RESPIRATORY_TRACT | 1 refills | Status: DC | PRN

## 2019-03-30 NOTE — Discharge Instructions (Addendum)
Follow-up with your family doctor in 2 to 3 days for recheck

## 2019-03-30 NOTE — ED Triage Notes (Signed)
Pt states that he is sob he has a lot of congestion
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
## 2019-10-11 ENCOUNTER — Emergency Department (HOSPITAL_COMMUNITY): Payer: Medicaid Other

## 2019-10-11 ENCOUNTER — Emergency Department (HOSPITAL_COMMUNITY)
Admission: EM | Admit: 2019-10-11 | Discharge: 2019-10-11 | Disposition: A | Discharge status: Home / Self Care | Payer: Medicaid Other | Attending: Emergency Medicine | Admitting: Emergency Medicine

## 2019-10-11 ENCOUNTER — Other Ambulatory Visit: Payer: Self-pay

## 2019-10-11 DIAGNOSIS — R0602 Shortness of breath: Secondary | ICD-10-CM | POA: Diagnosis not present

## 2019-10-11 DIAGNOSIS — F17210 Nicotine dependence, cigarettes, uncomplicated: Secondary | ICD-10-CM | POA: Diagnosis not present

## 2019-10-11 DIAGNOSIS — Z20822 Contact with and (suspected) exposure to covid-19: Secondary | ICD-10-CM | POA: Insufficient documentation

## 2019-10-11 DIAGNOSIS — R05 Cough: Secondary | ICD-10-CM | POA: Diagnosis not present

## 2019-10-11 DIAGNOSIS — I5032 Chronic diastolic (congestive) heart failure: Secondary | ICD-10-CM | POA: Insufficient documentation

## 2019-10-11 DIAGNOSIS — I110 Hypertensive heart disease with heart failure: Secondary | ICD-10-CM | POA: Diagnosis not present

## 2019-10-11 DIAGNOSIS — R0600 Dyspnea, unspecified: Secondary | ICD-10-CM | POA: Insufficient documentation

## 2019-10-11 DIAGNOSIS — Z79899 Other long term (current) drug therapy: Secondary | ICD-10-CM | POA: Insufficient documentation

## 2019-10-11 DIAGNOSIS — J449 Chronic obstructive pulmonary disease, unspecified: Secondary | ICD-10-CM | POA: Diagnosis not present

## 2019-10-11 LAB — CBC WITH DIFFERENTIAL/PLATELET
Abs Immature Granulocytes: 0.02 10*3/uL (ref 0.00–0.07)
Basophils Absolute: 0 10*3/uL (ref 0.0–0.1)
Basophils Relative: 1 %
Eosinophils Absolute: 0.1 10*3/uL (ref 0.0–0.5)
Eosinophils Relative: 2 %
HCT: 49.1 % (ref 39.0–52.0)
Hemoglobin: 16.7 g/dL (ref 13.0–17.0)
Immature Granulocytes: 1 %
Lymphocytes Relative: 25 %
Lymphs Abs: 1.1 10*3/uL (ref 0.7–4.0)
MCH: 35.8 pg — ABNORMAL HIGH (ref 26.0–34.0)
MCHC: 34 g/dL (ref 30.0–36.0)
MCV: 105.4 fL — ABNORMAL HIGH (ref 80.0–100.0)
Monocytes Absolute: 0.4 10*3/uL (ref 0.1–1.0)
Monocytes Relative: 9 %
Neutro Abs: 2.7 10*3/uL (ref 1.7–7.7)
Neutrophils Relative %: 62 %
Platelets: 208 10*3/uL (ref 150–400)
RBC: 4.66 MIL/uL (ref 4.22–5.81)
RDW: 14.1 % (ref 11.5–15.5)
WBC: 4.3 10*3/uL (ref 4.0–10.5)
nRBC: 0 % (ref 0.0–0.2)

## 2019-10-11 LAB — BRAIN NATRIURETIC PEPTIDE: B Natriuretic Peptide: 122.2 pg/mL — ABNORMAL HIGH (ref 0.0–100.0)

## 2019-10-11 LAB — BASIC METABOLIC PANEL
Anion gap: 8 (ref 5–15)
BUN: 7 mg/dL (ref 6–20)
CO2: 25 mmol/L (ref 22–32)
Calcium: 8.6 mg/dL — ABNORMAL LOW (ref 8.9–10.3)
Chloride: 103 mmol/L (ref 98–111)
Creatinine, Ser: 0.86 mg/dL (ref 0.61–1.24)
GFR calc Af Amer: >60 mL/min (ref >60)
GFR calc non Af Amer: >60 mL/min (ref >60)
Glucose, Bld: 198 mg/dL — ABNORMAL HIGH (ref 70–99)
Potassium: 4.5 mmol/L (ref 3.5–5.1)
Sodium: 136 mmol/L (ref 135–145)

## 2019-10-11 LAB — TROPONIN I (HIGH SENSITIVITY)
Troponin I (High Sensitivity): 21 ng/L — ABNORMAL HIGH (ref <18)
Troponin I (High Sensitivity): 18 ng/L — ABNORMAL HIGH (ref <18)

## 2019-10-11 LAB — SARS CORONAVIRUS 2 BY RT PCR (HOSPITAL ORDER, PERFORMED IN ~~LOC~~ HOSPITAL LAB): SARS Coronavirus 2: NEGATIVE

## 2019-10-11 MED ORDER — ALBUTEROL SULFATE HFA 108 (90 BASE) MCG/ACT IN AERS: 1.0000 | INHALATION_SPRAY | Freq: Four times a day (QID) | RESPIRATORY_TRACT | 1 refills | Status: DC | PRN

## 2019-10-11 MED ORDER — METHYLPREDNISOLONE SODIUM SUCC 125 MG IJ SOLR
125.0000 mg | Freq: Once | INTRAMUSCULAR | Status: AC
  Administered 2019-10-11: 125 mg via INTRAVENOUS
  Filled 2019-10-11: qty 2

## 2019-10-11 MED ORDER — ALBUTEROL SULFATE HFA 108 (90 BASE) MCG/ACT IN AERS
2.0000 | INHALATION_SPRAY | Freq: Once | RESPIRATORY_TRACT | Status: AC
  Administered 2019-10-11: 2 via RESPIRATORY_TRACT
  Filled 2019-10-11: qty 6.7

## 2019-10-11 MED ORDER — PREDNISONE 10 MG PO TABS
20.0000 mg | ORAL_TABLET | Freq: Every day | ORAL | 0 refills | Status: DC
Start: 2019-10-11 — End: 2019-10-17

## 2019-10-11 MED ORDER — AZITHROMYCIN 250 MG PO TABS
250.0000 mg | ORAL_TABLET | Freq: Every day | ORAL | 0 refills | Status: DC
Start: 2019-10-11 — End: 2019-10-21

## 2019-10-11 NOTE — Discharge Instructions (Addendum)
Return for any problem.  Follow-up with your regular care provider as instructed. °

## 2019-10-11 NOTE — ED Triage Notes (Signed)
Off all medications x 1 month.  Hx of COPD, CHF and smoker.

## 2019-10-11 NOTE — ED Triage Notes (Signed)
Called to hotel room for SOB, productive cough since last PM with chest wall pain noted.  HR 74, 94% on RA.   Given ASA 324mg  and refused Nitro d/t previous hx of HA.  Up amb in room without acute distress.
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
## 2019-10-12 MED FILL — predniSONE 10 MG TABS: 10 | 5 days supply | Qty: 10 | Fill #0

## 2019-10-12 MED FILL — AZITHROMYCIN 250 MG TABLET: 250 | 5 days supply | Qty: 6 | Fill #0

## 2019-10-12 MED FILL — ALBUTEROL SULFATE HFA 108 (: 108 (90 BAS | 25 days supply | Qty: 18 | Fill #0

## 2019-10-17 ENCOUNTER — Emergency Department (HOSPITAL_COMMUNITY): Payer: Medicaid Other

## 2019-10-17 ENCOUNTER — Emergency Department (HOSPITAL_COMMUNITY)
Admission: EM | Admit: 2019-10-17 | Discharge: 2019-10-17 | DRG: 191 | Discharge status: Against Medical Advice | Payer: Medicaid Other | Attending: Internal Medicine | Admitting: Internal Medicine

## 2019-10-17 ENCOUNTER — Other Ambulatory Visit: Payer: Self-pay

## 2019-10-17 ENCOUNTER — Encounter (HOSPITAL_COMMUNITY): Payer: Self-pay | Admitting: *Deleted

## 2019-10-17 DIAGNOSIS — F17210 Nicotine dependence, cigarettes, uncomplicated: Secondary | ICD-10-CM | POA: Diagnosis not present

## 2019-10-17 DIAGNOSIS — I110 Hypertensive heart disease with heart failure: Secondary | ICD-10-CM | POA: Diagnosis present

## 2019-10-17 DIAGNOSIS — Z9114 Patient's other noncompliance with medication regimen: Secondary | ICD-10-CM

## 2019-10-17 DIAGNOSIS — M545 Low back pain: Secondary | ICD-10-CM | POA: Diagnosis present

## 2019-10-17 DIAGNOSIS — R748 Abnormal levels of other serum enzymes: Secondary | ICD-10-CM | POA: Diagnosis present

## 2019-10-17 DIAGNOSIS — R0602 Shortness of breath: Secondary | ICD-10-CM | POA: Diagnosis not present

## 2019-10-17 DIAGNOSIS — R0789 Other chest pain: Secondary | ICD-10-CM

## 2019-10-17 DIAGNOSIS — I5032 Chronic diastolic (congestive) heart failure: Secondary | ICD-10-CM | POA: Diagnosis present

## 2019-10-17 DIAGNOSIS — J441 Chronic obstructive pulmonary disease with (acute) exacerbation: Secondary | ICD-10-CM | POA: Diagnosis not present

## 2019-10-17 DIAGNOSIS — F1410 Cocaine abuse, uncomplicated: Secondary | ICD-10-CM

## 2019-10-17 DIAGNOSIS — Z880 Allergy status to penicillin: Secondary | ICD-10-CM | POA: Diagnosis not present

## 2019-10-17 DIAGNOSIS — Z760 Encounter for issue of repeat prescription: Secondary | ICD-10-CM

## 2019-10-17 DIAGNOSIS — Z79899 Other long term (current) drug therapy: Secondary | ICD-10-CM | POA: Diagnosis not present

## 2019-10-17 DIAGNOSIS — Z7952 Long term (current) use of systemic steroids: Secondary | ICD-10-CM

## 2019-10-17 DIAGNOSIS — R001 Bradycardia, unspecified: Secondary | ICD-10-CM

## 2019-10-17 DIAGNOSIS — I10 Essential (primary) hypertension: Secondary | ICD-10-CM | POA: Diagnosis not present

## 2019-10-17 DIAGNOSIS — Z8249 Family history of ischemic heart disease and other diseases of the circulatory system: Secondary | ICD-10-CM

## 2019-10-17 DIAGNOSIS — T751XXA Unspecified effects of drowning and nonfatal submersion, initial encounter: Secondary | ICD-10-CM | POA: Diagnosis not present

## 2019-10-17 DIAGNOSIS — F1010 Alcohol abuse, uncomplicated: Secondary | ICD-10-CM | POA: Diagnosis present

## 2019-10-17 DIAGNOSIS — F1910 Other psychoactive substance abuse, uncomplicated: Secondary | ICD-10-CM | POA: Diagnosis present

## 2019-10-17 DIAGNOSIS — R058 Other specified cough: Secondary | ICD-10-CM

## 2019-10-17 DIAGNOSIS — G8929 Other chronic pain: Secondary | ICD-10-CM | POA: Diagnosis not present

## 2019-10-17 DIAGNOSIS — Z20822 Contact with and (suspected) exposure to covid-19: Secondary | ICD-10-CM | POA: Diagnosis present

## 2019-10-17 DIAGNOSIS — Z720 Tobacco use: Secondary | ICD-10-CM | POA: Diagnosis present

## 2019-10-17 LAB — RAPID URINE DRUG SCREEN, HOSP PERFORMED
Amphetamines: NOT DETECTED
Barbiturates: NOT DETECTED
Benzodiazepines: NOT DETECTED
Cocaine: POSITIVE — AB
Opiates: NOT DETECTED
Tetrahydrocannabinol: NOT DETECTED

## 2019-10-17 LAB — ETHANOL: Alcohol, Ethyl (B): <10 mg/dL (ref <10)

## 2019-10-17 LAB — COMPREHENSIVE METABOLIC PANEL
ALT: 133 U/L — ABNORMAL HIGH (ref 0–44)
AST: 92 U/L — ABNORMAL HIGH (ref 15–41)
Albumin: 3.6 g/dL (ref 3.5–5.0)
Alkaline Phosphatase: 83 U/L (ref 38–126)
Anion gap: 9 (ref 5–15)
BUN: 10 mg/dL (ref 6–20)
CO2: 32 mmol/L (ref 22–32)
Calcium: 9 mg/dL (ref 8.9–10.3)
Chloride: 96 mmol/L — ABNORMAL LOW (ref 98–111)
Creatinine, Ser: 0.71 mg/dL (ref 0.61–1.24)
GFR calc Af Amer: >60 mL/min (ref >60)
GFR calc non Af Amer: >60 mL/min (ref >60)
Glucose, Bld: 123 mg/dL — ABNORMAL HIGH (ref 70–99)
Potassium: 4.2 mmol/L (ref 3.5–5.1)
Sodium: 137 mmol/L (ref 135–145)
Total Bilirubin: 1 mg/dL (ref 0.3–1.2)
Total Protein: 7.4 g/dL (ref 6.5–8.1)

## 2019-10-17 LAB — CBC
HCT: 44.9 % (ref 39.0–52.0)
Hemoglobin: 15.4 g/dL (ref 13.0–17.0)
MCH: 36 pg — ABNORMAL HIGH (ref 26.0–34.0)
MCHC: 34.3 g/dL (ref 30.0–36.0)
MCV: 104.9 fL — ABNORMAL HIGH (ref 80.0–100.0)
Platelets: 183 10*3/uL (ref 150–400)
RBC: 4.28 MIL/uL (ref 4.22–5.81)
RDW: 13.5 % (ref 11.5–15.5)
WBC: 6.8 10*3/uL (ref 4.0–10.5)
nRBC: 0 % (ref 0.0–0.2)

## 2019-10-17 LAB — BRAIN NATRIURETIC PEPTIDE: B Natriuretic Peptide: 258.7 pg/mL — ABNORMAL HIGH (ref 0.0–100.0)

## 2019-10-17 LAB — TROPONIN I (HIGH SENSITIVITY)
Troponin I (High Sensitivity): 22 ng/L — ABNORMAL HIGH (ref <18)
Troponin I (High Sensitivity): 21 ng/L — ABNORMAL HIGH (ref <18)

## 2019-10-17 LAB — SARS CORONAVIRUS 2 BY RT PCR (HOSPITAL ORDER, PERFORMED IN ~~LOC~~ HOSPITAL LAB): SARS Coronavirus 2: NEGATIVE

## 2019-10-17 MED ORDER — IPRATROPIUM BROMIDE HFA 17 MCG/ACT IN AERS: 2.0000 | INHALATION_SPRAY | Freq: Once | RESPIRATORY_TRACT | Status: DC

## 2019-10-17 MED ORDER — ALBUTEROL SULFATE (2.5 MG/3ML) 0.083% IN NEBU: 2.5000 mg | INHALATION_SOLUTION | RESPIRATORY_TRACT | Status: DC | PRN

## 2019-10-17 MED ORDER — IPRATROPIUM BROMIDE HFA 17 MCG/ACT IN AERS
2.0000 | INHALATION_SPRAY | Freq: Once | RESPIRATORY_TRACT | Status: AC
  Administered 2019-10-17: 2 via RESPIRATORY_TRACT
  Filled 2019-10-17: qty 12.9

## 2019-10-17 MED ORDER — METHYLPREDNISOLONE SODIUM SUCC 125 MG IJ SOLR
125.0000 mg | Freq: Once | INTRAMUSCULAR | Status: AC
  Administered 2019-10-17: 125 mg via INTRAVENOUS
  Filled 2019-10-17: qty 2

## 2019-10-17 MED ORDER — ENOXAPARIN SODIUM 40 MG/0.4ML ~~LOC~~ SOLN: 40.0000 mg | SUBCUTANEOUS | Status: DC

## 2019-10-17 MED ORDER — LEVOFLOXACIN 500 MG PO TABS
500.0000 mg | ORAL_TABLET | Freq: Every day | ORAL | 0 refills | Status: DC
Start: 2019-10-17 — End: 2019-10-21

## 2019-10-17 MED ORDER — SODIUM CHLORIDE 0.9 % IV SOLN: 2.0000 g | INTRAVENOUS | Status: DC

## 2019-10-17 MED ORDER — PREDNISONE 10 MG PO TABS: 20.0000 mg | ORAL_TABLET | Freq: Every day | ORAL | 0 refills | Status: DC

## 2019-10-17 MED ORDER — LISINOPRIL 20 MG PO TABS: 20.0000 mg | ORAL_TABLET | Freq: Every day | ORAL | 0 refills | Status: DC

## 2019-10-17 MED ORDER — INSULIN ASPART 100 UNIT/ML ~~LOC~~ SOLN: 0.0000 [IU] | Freq: Three times a day (TID) | SUBCUTANEOUS | Status: DC

## 2019-10-17 MED ORDER — SODIUM CHLORIDE 0.9 % IV SOLN: 500.0000 mg | INTRAVENOUS | Status: DC

## 2019-10-17 MED ORDER — CLONIDINE HCL 0.2 MG PO TABS: 0.2000 mg | ORAL_TABLET | Freq: Every day | ORAL | 0 refills | Status: DC

## 2019-10-17 MED ORDER — INSULIN ASPART 100 UNIT/ML ~~LOC~~ SOLN: 0.0000 [IU] | Freq: Every day | SUBCUTANEOUS | Status: DC

## 2019-10-17 MED ORDER — ALBUTEROL SULFATE HFA 108 (90 BASE) MCG/ACT IN AERS
4.0000 | INHALATION_SPRAY | Freq: Once | RESPIRATORY_TRACT | Status: AC
  Administered 2019-10-17: 4 via RESPIRATORY_TRACT
  Filled 2019-10-17: qty 6.7

## 2019-10-17 MED ORDER — IPRATROPIUM-ALBUTEROL 0.5-2.5 (3) MG/3ML IN SOLN: 3.0000 mL | Freq: Four times a day (QID) | RESPIRATORY_TRACT | Status: DC

## 2019-10-17 MED ORDER — ALBUTEROL SULFATE HFA 108 (90 BASE) MCG/ACT IN AERS: 4.0000 | INHALATION_SPRAY | Freq: Once | RESPIRATORY_TRACT | Status: DC

## 2019-10-17 MED ORDER — PREDNISONE 20 MG PO TABS: 40.0000 mg | ORAL_TABLET | Freq: Every day | ORAL | Status: DC

## 2019-10-17 NOTE — ED Notes (Signed)
Pt states improved sob after medications.  Diaphoresis improved.

## 2019-10-17 NOTE — ED Notes (Signed)
Pt complaining room is hot.  Ice pack provided.  Also, graham crackers and sprite.  Covid test negative so door left open to encourage cooling effect.
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
## 2019-10-17 NOTE — ED Provider Notes (Addendum)
54 year old male that I received in signout from Dr. Ashok Cordia.  Briefly the patient has had 2 to 3 days of cough and shortness of breath.  Found to be hypoxic on arrival.  Had some improvement with inhalers here.  Hospitalist came to evaluate the patient for admission and he is unwilling to stay.  States that he has some things he needs to take care of first.  Told me that he will likely return if he cannot be admitted.  Will start on antibiotics.  Burst of steroids.  Let him take his inhalers at home.  PCP follow-up.  Has some social issues with follow-up as well as obtaining medications case management was consulted.  Left AMA.    Deno Etienne, DO 10/17/19 [place], [place], DO 10/17/19 1758

## 2019-10-17 NOTE — ED Triage Notes (Addendum)
Pt states 2 days of increased sob.  Was tx here for same 1 week prior and was discharged.  Pt was unable to buy prescriptions d/t lack of money.  Pt sats were 86% on RA initially, that increased to 99% on 2L, though breathing remains labored.  EMS would noticed moents of decreased loc and hr would drop to 30.  Bo 210/120, rr 22.  Pt lives at the [place] [facility].  Pt states he drank 1 beer today and used cocaine 2 days ago.

## 2019-10-17 NOTE — ED Notes (Signed)
Got patient undress into a gown on the monitor did ekg patient is resting with call bell in reach

## 2019-10-17 NOTE — ED Notes (Signed)
Pt states he is leaving.  States he needs to go get his stuff out of his hotel.  MD is at bedside speaking with pt.
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
## 2019-10-17 NOTE — ED Notes (Signed)
Pt yelling at staff stating that we did not stop the alarm fast enough.  He states he's just going to go home and die.  MD notified.  Pt states he's going home unless he can eat.  MD stated pt can eat.  Pt calm and eating.

## 2019-10-19 ENCOUNTER — Encounter (HOSPITAL_COMMUNITY): Payer: Self-pay

## 2019-10-19 ENCOUNTER — Inpatient Hospital Stay (HOSPITAL_COMMUNITY)
Admission: EM | Admit: 2019-10-19 | Discharge: 2019-10-21 | DRG: 190 | Disposition: A | Discharge status: Home / Self Care | Payer: Medicaid Other | Attending: Internal Medicine | Admitting: Internal Medicine

## 2019-10-19 ENCOUNTER — Emergency Department (HOSPITAL_COMMUNITY): Payer: Medicaid Other

## 2019-10-19 ENCOUNTER — Other Ambulatory Visit: Payer: Self-pay

## 2019-10-19 DIAGNOSIS — I10 Essential (primary) hypertension: Secondary | ICD-10-CM | POA: Diagnosis present

## 2019-10-19 DIAGNOSIS — E872 Acidosis: Secondary | ICD-10-CM | POA: Diagnosis present

## 2019-10-19 DIAGNOSIS — G8929 Other chronic pain: Secondary | ICD-10-CM | POA: Diagnosis present

## 2019-10-19 DIAGNOSIS — Z8249 Family history of ischemic heart disease and other diseases of the circulatory system: Secondary | ICD-10-CM

## 2019-10-19 DIAGNOSIS — I472 Ventricular tachycardia: Secondary | ICD-10-CM | POA: Diagnosis not present

## 2019-10-19 DIAGNOSIS — Z9119 Patient's noncompliance with other medical treatment and regimen: Secondary | ICD-10-CM

## 2019-10-19 DIAGNOSIS — J441 Chronic obstructive pulmonary disease with (acute) exacerbation: Principal | ICD-10-CM | POA: Diagnosis present

## 2019-10-19 DIAGNOSIS — Z20822 Contact with and (suspected) exposure to covid-19: Secondary | ICD-10-CM | POA: Diagnosis present

## 2019-10-19 DIAGNOSIS — Z79899 Other long term (current) drug therapy: Secondary | ICD-10-CM

## 2019-10-19 DIAGNOSIS — I110 Hypertensive heart disease with heart failure: Secondary | ICD-10-CM | POA: Diagnosis present

## 2019-10-19 DIAGNOSIS — F1190 Opioid use, unspecified, uncomplicated: Secondary | ICD-10-CM | POA: Diagnosis present

## 2019-10-19 DIAGNOSIS — R001 Bradycardia, unspecified: Secondary | ICD-10-CM | POA: Diagnosis present

## 2019-10-19 DIAGNOSIS — R0602 Shortness of breath: Secondary | ICD-10-CM | POA: Diagnosis not present

## 2019-10-19 DIAGNOSIS — F410 Panic disorder [episodic paroxysmal anxiety] without agoraphobia: Secondary | ICD-10-CM | POA: Diagnosis present

## 2019-10-19 DIAGNOSIS — F17210 Nicotine dependence, cigarettes, uncomplicated: Secondary | ICD-10-CM | POA: Diagnosis present

## 2019-10-19 DIAGNOSIS — J9602 Acute respiratory failure with hypercapnia: Secondary | ICD-10-CM | POA: Diagnosis not present

## 2019-10-19 DIAGNOSIS — F1010 Alcohol abuse, uncomplicated: Secondary | ICD-10-CM | POA: Diagnosis present

## 2019-10-19 DIAGNOSIS — F1490 Cocaine use, unspecified, uncomplicated: Secondary | ICD-10-CM | POA: Diagnosis present

## 2019-10-19 DIAGNOSIS — R748 Abnormal levels of other serum enzymes: Secondary | ICD-10-CM | POA: Diagnosis present

## 2019-10-19 DIAGNOSIS — Z880 Allergy status to penicillin: Secondary | ICD-10-CM

## 2019-10-19 DIAGNOSIS — Z590 Homelessness: Secondary | ICD-10-CM

## 2019-10-19 DIAGNOSIS — R0600 Dyspnea, unspecified: Secondary | ICD-10-CM | POA: Diagnosis not present

## 2019-10-19 DIAGNOSIS — I5032 Chronic diastolic (congestive) heart failure: Secondary | ICD-10-CM | POA: Diagnosis present

## 2019-10-19 DIAGNOSIS — J9601 Acute respiratory failure with hypoxia: Secondary | ICD-10-CM

## 2019-10-19 DIAGNOSIS — G9349 Other encephalopathy: Secondary | ICD-10-CM | POA: Diagnosis present

## 2019-10-19 DIAGNOSIS — I517 Cardiomegaly: Secondary | ICD-10-CM | POA: Diagnosis not present

## 2019-10-19 LAB — CBC WITH DIFFERENTIAL/PLATELET
Abs Immature Granulocytes: 0.1 10*3/uL — ABNORMAL HIGH (ref 0.00–0.07)
Basophils Absolute: 0 10*3/uL (ref 0.0–0.1)
Basophils Relative: 0 %
Eosinophils Absolute: 0.1 10*3/uL (ref 0.0–0.5)
Eosinophils Relative: 1 %
HCT: 47.5 % (ref 39.0–52.0)
Hemoglobin: 15.9 g/dL (ref 13.0–17.0)
Immature Granulocytes: 1 %
Lymphocytes Relative: 34 %
Lymphs Abs: 3.4 10*3/uL (ref 0.7–4.0)
MCH: 35.9 pg — ABNORMAL HIGH (ref 26.0–34.0)
MCHC: 33.5 g/dL (ref 30.0–36.0)
MCV: 107.2 fL — ABNORMAL HIGH (ref 80.0–100.0)
Monocytes Absolute: 0.8 10*3/uL (ref 0.1–1.0)
Monocytes Relative: 8 %
Neutro Abs: 5.5 10*3/uL (ref 1.7–7.7)
Neutrophils Relative %: 56 %
Platelets: 207 10*3/uL (ref 150–400)
RBC: 4.43 MIL/uL (ref 4.22–5.81)
RDW: 13.9 % (ref 11.5–15.5)
WBC: 9.9 10*3/uL (ref 4.0–10.5)
nRBC: 0 % (ref 0.0–0.2)

## 2019-10-19 LAB — COMPREHENSIVE METABOLIC PANEL
ALT: 116 U/L — ABNORMAL HIGH (ref 0–44)
AST: 68 U/L — ABNORMAL HIGH (ref 15–41)
Albumin: 3.5 g/dL (ref 3.5–5.0)
Alkaline Phosphatase: 80 U/L (ref 38–126)
Anion gap: 9 (ref 5–15)
BUN: 11 mg/dL (ref 6–20)
CO2: 28 mmol/L (ref 22–32)
Calcium: 8.7 mg/dL — ABNORMAL LOW (ref 8.9–10.3)
Chloride: 97 mmol/L — ABNORMAL LOW (ref 98–111)
Creatinine, Ser: 0.84 mg/dL (ref 0.61–1.24)
GFR calc Af Amer: >60 mL/min (ref >60)
GFR calc non Af Amer: >60 mL/min (ref >60)
Glucose, Bld: 191 mg/dL — ABNORMAL HIGH (ref 70–99)
Potassium: 4.1 mmol/L (ref 3.5–5.1)
Sodium: 134 mmol/L — ABNORMAL LOW (ref 135–145)
Total Bilirubin: 0.9 mg/dL (ref 0.3–1.2)
Total Protein: 7.2 g/dL (ref 6.5–8.1)

## 2019-10-19 LAB — POCT I-STAT EG7
Acid-Base Excess: 8 mmol/L — ABNORMAL HIGH (ref 0.0–2.0)
Bicarbonate: 34.2 mmol/L — ABNORMAL HIGH (ref 20.0–28.0)
Calcium, Ion: 1.01 mmol/L — ABNORMAL LOW (ref 1.15–1.40)
HCT: 45 % (ref 39.0–52.0)
Hemoglobin: 15.3 g/dL (ref 13.0–17.0)
O2 Saturation: 98 %
Potassium: 3.8 mmol/L (ref 3.5–5.1)
Sodium: 134 mmol/L — ABNORMAL LOW (ref 135–145)
TCO2: 36 mmol/L — ABNORMAL HIGH (ref 22–32)
pCO2, Ven: 52.5 mmHg (ref 44.0–60.0)
pH, Ven: 7.421 (ref 7.250–7.430)
pO2, Ven: 99 mmHg — ABNORMAL HIGH (ref 32.0–45.0)

## 2019-10-19 LAB — HEPATITIS PANEL, ACUTE
HCV Ab: REACTIVE — AB
Hep A IgM: NONREACTIVE
Hep B C IgM: NONREACTIVE
Hepatitis B Surface Ag: NONREACTIVE

## 2019-10-19 LAB — SARS CORONAVIRUS 2 BY RT PCR (HOSPITAL ORDER, PERFORMED IN ~~LOC~~ HOSPITAL LAB): SARS Coronavirus 2: NEGATIVE

## 2019-10-19 LAB — POCT I-STAT 7, (LYTES, BLD GAS, ICA,H+H)
Acid-Base Excess: 5 mmol/L — ABNORMAL HIGH (ref 0.0–2.0)
Bicarbonate: 34 mmol/L — ABNORMAL HIGH (ref 20.0–28.0)
Calcium, Ion: 1.27 mmol/L (ref 1.15–1.40)
HCT: 44 % (ref 39.0–52.0)
Hemoglobin: 15 g/dL (ref 13.0–17.0)
O2 Saturation: 92 %
Patient temperature: 98
Potassium: 3.7 mmol/L (ref 3.5–5.1)
Sodium: 134 mmol/L — ABNORMAL LOW (ref 135–145)
TCO2: 36 mmol/L — ABNORMAL HIGH (ref 22–32)
pCO2 arterial: 65.7 mmHg (ref 32.0–48.0)
pH, Arterial: 7.32 — ABNORMAL LOW (ref 7.350–7.450)
pO2, Arterial: 71 mmHg — ABNORMAL LOW (ref 83.0–108.0)

## 2019-10-19 LAB — RAPID URINE DRUG SCREEN, HOSP PERFORMED
Amphetamines: NOT DETECTED
Barbiturates: NOT DETECTED
Benzodiazepines: NOT DETECTED
Cocaine: POSITIVE — AB
Opiates: NOT DETECTED
Tetrahydrocannabinol: NOT DETECTED

## 2019-10-19 LAB — BRAIN NATRIURETIC PEPTIDE: B Natriuretic Peptide: 288.6 pg/mL — ABNORMAL HIGH (ref 0.0–100.0)

## 2019-10-19 LAB — PHOSPHORUS: Phosphorus: 4 mg/dL (ref 2.5–4.6)

## 2019-10-19 LAB — HIV ANTIBODY (ROUTINE TESTING W REFLEX): HIV Screen 4th Generation wRfx: NONREACTIVE

## 2019-10-19 LAB — GLUCOSE, CAPILLARY: Glucose-Capillary: 213 mg/dL — ABNORMAL HIGH (ref 70–99)

## 2019-10-19 LAB — MAGNESIUM: Magnesium: 1.8 mg/dL (ref 1.7–2.4)

## 2019-10-19 LAB — TROPONIN I (HIGH SENSITIVITY)
Troponin I (High Sensitivity): 27 ng/L — ABNORMAL HIGH (ref <18)
Troponin I (High Sensitivity): 29 ng/L — ABNORMAL HIGH (ref <18)

## 2019-10-19 MED ORDER — GUAIFENESIN-DM 100-10 MG/5ML PO SYRP
5.0000 mL | ORAL_SOLUTION | ORAL | Status: DC | PRN
  Administered 2019-10-19 – 2019-10-21 (×4): 5 mL via ORAL
  Filled 2019-10-19 (×4): qty 5

## 2019-10-19 MED ORDER — THIAMINE HCL 100 MG PO TABS
100.0000 mg | ORAL_TABLET | Freq: Every day | ORAL | Status: DC
  Administered 2019-10-19 – 2019-10-21 (×2): 100 mg via ORAL
  Filled 2019-10-19 (×3): qty 1

## 2019-10-19 MED ORDER — PREDNISONE 20 MG PO TABS
40.0000 mg | ORAL_TABLET | Freq: Every day | ORAL | Status: DC
  Administered 2019-10-19 – 2019-10-21 (×3): 40 mg via ORAL
  Filled 2019-10-19 (×3): qty 2

## 2019-10-19 MED ORDER — THIAMINE HCL 100 MG/ML IJ SOLN
100.0000 mg | Freq: Every day | INTRAMUSCULAR | Status: DC
  Administered 2019-10-20: 100 mg via INTRAVENOUS
  Filled 2019-10-19: qty 2

## 2019-10-19 MED ORDER — NALOXONE HCL 0.4 MG/ML IJ SOLN: 0.4000 mg | INTRAMUSCULAR | Status: DC | PRN

## 2019-10-19 MED ORDER — IPRATROPIUM-ALBUTEROL 0.5-2.5 (3) MG/3ML IN SOLN
3.0000 mL | Freq: Four times a day (QID) | RESPIRATORY_TRACT | Status: DC
  Administered 2019-10-19: 3 mL via RESPIRATORY_TRACT
  Filled 2019-10-19 (×2): qty 3

## 2019-10-19 MED ORDER — AZITHROMYCIN 500 MG PO TABS
500.0000 mg | ORAL_TABLET | Freq: Every day | ORAL | Status: DC
  Administered 2019-10-20: 500 mg via ORAL
  Filled 2019-10-19: qty 1

## 2019-10-19 MED ORDER — FUROSEMIDE 10 MG/ML IJ SOLN
40.0000 mg | Freq: Once | INTRAMUSCULAR | Status: AC
  Administered 2019-10-19: 40 mg via INTRAVENOUS
  Filled 2019-10-19: qty 4

## 2019-10-19 MED ORDER — SODIUM CHLORIDE 0.9 % IV SOLN
500.0000 mg | INTRAVENOUS | Status: AC
  Filled 2019-10-19: qty 500

## 2019-10-19 MED ORDER — ADULT MULTIVITAMIN W/MINERALS CH
1.0000 | ORAL_TABLET | Freq: Every day | ORAL | Status: DC
  Administered 2019-10-19 – 2019-10-21 (×3): 1 via ORAL
  Filled 2019-10-19 (×3): qty 1

## 2019-10-19 MED ORDER — IPRATROPIUM-ALBUTEROL 0.5-2.5 (3) MG/3ML IN SOLN
3.0000 mL | Freq: Once | RESPIRATORY_TRACT | Status: AC | PRN
  Administered 2019-10-20: 3 mL via RESPIRATORY_TRACT
  Filled 2019-10-19: qty 3

## 2019-10-19 MED ORDER — UMECLIDINIUM BROMIDE 62.5 MCG/INH IN AEPB
1.0000 | INHALATION_SPRAY | Freq: Every day | RESPIRATORY_TRACT | Status: DC
  Filled 2019-10-19 (×2): qty 7

## 2019-10-19 MED ORDER — FOLIC ACID 1 MG PO TABS
1.0000 mg | ORAL_TABLET | Freq: Every day | ORAL | Status: DC
  Administered 2019-10-19 – 2019-10-21 (×3): 1 mg via ORAL
  Filled 2019-10-19 (×3): qty 1

## 2019-10-19 MED ORDER — FUROSEMIDE 10 MG/ML IJ SOLN
40.0000 mg | Freq: Every day | INTRAMUSCULAR | Status: DC
  Administered 2019-10-20: 40 mg via INTRAVENOUS
  Filled 2019-10-19: qty 4

## 2019-10-19 MED ORDER — IPRATROPIUM-ALBUTEROL 0.5-2.5 (3) MG/3ML IN SOLN
3.0000 mL | Freq: Four times a day (QID) | RESPIRATORY_TRACT | Status: DC
  Administered 2019-10-20: 3 mL via RESPIRATORY_TRACT
  Filled 2019-10-19: qty 3

## 2019-10-19 MED ORDER — IPRATROPIUM-ALBUTEROL 0.5-2.5 (3) MG/3ML IN SOLN
3.0000 mL | Freq: Once | RESPIRATORY_TRACT | Status: AC
  Administered 2019-10-19: 3 mL via RESPIRATORY_TRACT
  Filled 2019-10-19: qty 3

## 2019-10-19 MED ORDER — ENOXAPARIN SODIUM 40 MG/0.4ML ~~LOC~~ SOLN
40.0000 mg | SUBCUTANEOUS | Status: DC
  Administered 2019-10-19 – 2019-10-20 (×2): 40 mg via SUBCUTANEOUS
  Filled 2019-10-19 (×2): qty 0.4

## 2019-10-19 MED ORDER — ATROPINE SULFATE 1 MG/10ML IJ SOSY
PREFILLED_SYRINGE | INTRAMUSCULAR | Status: AC
  Filled 2019-10-19: qty 10

## 2019-10-19 MED ORDER — ATROPINE SULFATE 1 MG/10ML IJ SOSY: 0.5000 mg | PREFILLED_SYRINGE | INTRAMUSCULAR | Status: DC | PRN

## 2019-10-19 NOTE — Progress Notes (Signed)
RT NOTES: Entered room to give patient 1400 Tx and patient shakes his head and says absolutely not because he was eating. He states he's not wheezing and does not need it right now. Will check back later.

## 2019-10-19 NOTE — Progress Notes (Signed)
Spoke to Randall Hayward, MD and physicians are aware of the bradycardia during rest and during activity. Relayed to monitor BP level in congruent with the HR. Notify TS when SBP is <120.

## 2019-10-19 NOTE — ED Triage Notes (Signed)
Patient arrived by [facility] from home for increasing SOB past 3 hours. EMS administered 5 albuterol, 125 soulmedrol. 80% RA, on NRB sating at 100%.

## 2019-10-19 NOTE — Progress Notes (Signed)
CCMD called to inform patient HR has been sustaining in the 40s. Patient is asleep however upon assessment, patient expresses no shortness of breathe or chest pain - to myself and a fellow nurse. Paged Marianna Payment, MD to notify. Will continue to monitor.
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
## 2019-10-19 NOTE — ED Notes (Signed)
Urine culture collected and sent to lab.

## 2019-10-19 NOTE — Plan of Care (Signed)
  Problem: Education: Goal: Knowledge of General Education information will improve Description: Including pain rating scale, medication(s)/side effects and non-pharmacologic comfort measures Outcome: Progressing   Problem: Clinical Measurements: Goal: Respiratory complications will improve Outcome: Progressing   Problem: Nutrition: Goal: Adequate nutrition will be maintained Outcome: Progressing   

## 2019-10-19 NOTE — Progress Notes (Addendum)
Patient is alert and oriented x 4 and gave permission to speak to his mother in regards to his current status and plan of care. Patient's mother wanted to know about why the patient's is so sleepy. Informed the patient's mother that the patient relayed that he hasn't been sleeping well for the past three days due to the coughing. Patient's mother understands and just asks to be updated on her son's care whenever a physician can. Patient's mother is name Jakylan Rhodd and her phone number is [phone].

## 2019-10-19 NOTE — Progress Notes (Signed)
Paged Randall Hayward, MD from teaching service to notify and there has not been a response. Paged Eulas Post, [place] from cardiology to notify about the patient's HR sustaining in the 50s. Patient is currently asleep.
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
## 2019-10-20 ENCOUNTER — Observation Stay (HOSPITAL_COMMUNITY): Payer: Medicaid Other

## 2019-10-20 DIAGNOSIS — Z880 Allergy status to penicillin: Secondary | ICD-10-CM | POA: Diagnosis not present

## 2019-10-20 DIAGNOSIS — F1010 Alcohol abuse, uncomplicated: Secondary | ICD-10-CM | POA: Diagnosis present

## 2019-10-20 DIAGNOSIS — E872 Acidosis: Secondary | ICD-10-CM | POA: Diagnosis present

## 2019-10-20 DIAGNOSIS — G8929 Other chronic pain: Secondary | ICD-10-CM | POA: Diagnosis present

## 2019-10-20 DIAGNOSIS — R0602 Shortness of breath: Secondary | ICD-10-CM | POA: Diagnosis not present

## 2019-10-20 DIAGNOSIS — F1190 Opioid use, unspecified, uncomplicated: Secondary | ICD-10-CM | POA: Diagnosis present

## 2019-10-20 DIAGNOSIS — Z79899 Other long term (current) drug therapy: Secondary | ICD-10-CM | POA: Diagnosis not present

## 2019-10-20 DIAGNOSIS — I472 Ventricular tachycardia: Secondary | ICD-10-CM | POA: Diagnosis not present

## 2019-10-20 DIAGNOSIS — Z590 Homelessness: Secondary | ICD-10-CM | POA: Diagnosis not present

## 2019-10-20 DIAGNOSIS — G9349 Other encephalopathy: Secondary | ICD-10-CM | POA: Diagnosis present

## 2019-10-20 DIAGNOSIS — Z8249 Family history of ischemic heart disease and other diseases of the circulatory system: Secondary | ICD-10-CM | POA: Diagnosis not present

## 2019-10-20 DIAGNOSIS — Z20822 Contact with and (suspected) exposure to covid-19: Secondary | ICD-10-CM | POA: Diagnosis present

## 2019-10-20 DIAGNOSIS — J9602 Acute respiratory failure with hypercapnia: Secondary | ICD-10-CM

## 2019-10-20 DIAGNOSIS — F1490 Cocaine use, unspecified, uncomplicated: Secondary | ICD-10-CM | POA: Diagnosis present

## 2019-10-20 DIAGNOSIS — Z9119 Patient's noncompliance with other medical treatment and regimen: Secondary | ICD-10-CM | POA: Diagnosis not present

## 2019-10-20 DIAGNOSIS — I110 Hypertensive heart disease with heart failure: Secondary | ICD-10-CM | POA: Diagnosis present

## 2019-10-20 DIAGNOSIS — I5032 Chronic diastolic (congestive) heart failure: Secondary | ICD-10-CM

## 2019-10-20 DIAGNOSIS — J9601 Acute respiratory failure with hypoxia: Secondary | ICD-10-CM

## 2019-10-20 DIAGNOSIS — R001 Bradycardia, unspecified: Secondary | ICD-10-CM | POA: Diagnosis not present

## 2019-10-20 DIAGNOSIS — F17210 Nicotine dependence, cigarettes, uncomplicated: Secondary | ICD-10-CM | POA: Diagnosis present

## 2019-10-20 DIAGNOSIS — I517 Cardiomegaly: Secondary | ICD-10-CM | POA: Diagnosis not present

## 2019-10-20 DIAGNOSIS — F410 Panic disorder [episodic paroxysmal anxiety] without agoraphobia: Secondary | ICD-10-CM | POA: Diagnosis present

## 2019-10-20 DIAGNOSIS — R0600 Dyspnea, unspecified: Secondary | ICD-10-CM | POA: Diagnosis not present

## 2019-10-20 DIAGNOSIS — R748 Abnormal levels of other serum enzymes: Secondary | ICD-10-CM | POA: Diagnosis present

## 2019-10-20 DIAGNOSIS — J441 Chronic obstructive pulmonary disease with (acute) exacerbation: Secondary | ICD-10-CM | POA: Diagnosis not present

## 2019-10-20 LAB — LIPID PANEL
Cholesterol: 140 mg/dL (ref 0–200)
HDL: 59 mg/dL (ref >40)
LDL Cholesterol: 72 mg/dL (ref 0–99)
Total CHOL/HDL Ratio: 2.4 RATIO
Triglycerides: 47 mg/dL (ref <150)
VLDL: 9 mg/dL (ref 0–40)

## 2019-10-20 LAB — ECHOCARDIOGRAM COMPLETE
Height: 69 in
Weight: 3198.4 oz

## 2019-10-20 LAB — BASIC METABOLIC PANEL
Anion gap: 9 (ref 5–15)
BUN: 11 mg/dL (ref 6–20)
CO2: 34 mmol/L — ABNORMAL HIGH (ref 22–32)
Calcium: 8.7 mg/dL — ABNORMAL LOW (ref 8.9–10.3)
Chloride: 95 mmol/L — ABNORMAL LOW (ref 98–111)
Creatinine, Ser: 0.7 mg/dL (ref 0.61–1.24)
GFR calc Af Amer: >60 mL/min (ref >60)
GFR calc non Af Amer: >60 mL/min (ref >60)
Glucose, Bld: 117 mg/dL — ABNORMAL HIGH (ref 70–99)
Potassium: 3.6 mmol/L (ref 3.5–5.1)
Sodium: 138 mmol/L (ref 135–145)

## 2019-10-20 LAB — HEMOGLOBIN A1C
Hgb A1c MFr Bld: 6.4 % — ABNORMAL HIGH (ref 4.8–5.6)
Mean Plasma Glucose: 136.98 mg/dL

## 2019-10-20 LAB — CBC
HCT: 46.4 % (ref 39.0–52.0)
Hemoglobin: 15.4 g/dL (ref 13.0–17.0)
MCH: 35.9 pg — ABNORMAL HIGH (ref 26.0–34.0)
MCHC: 33.2 g/dL (ref 30.0–36.0)
MCV: 108.2 fL — ABNORMAL HIGH (ref 80.0–100.0)
Platelets: 185 10*3/uL (ref 150–400)
RBC: 4.29 MIL/uL (ref 4.22–5.81)
RDW: 14.3 % (ref 11.5–15.5)
WBC: 9.6 10*3/uL (ref 4.0–10.5)
nRBC: 0 % (ref 0.0–0.2)

## 2019-10-20 MED ORDER — HYDROCHLOROTHIAZIDE 25 MG PO TABS
12.5000 mg | ORAL_TABLET | Freq: Every day | ORAL | Status: DC
  Administered 2019-10-20 – 2019-10-21 (×2): 12.5 mg via ORAL
  Filled 2019-10-20 (×2): qty 1

## 2019-10-20 MED ORDER — LISINOPRIL 20 MG PO TABS
20.0000 mg | ORAL_TABLET | Freq: Every day | ORAL | Status: DC
  Administered 2019-10-20 – 2019-10-21 (×2): 20 mg via ORAL
  Filled 2019-10-20 (×2): qty 1

## 2019-10-20 MED ORDER — IPRATROPIUM-ALBUTEROL 0.5-2.5 (3) MG/3ML IN SOLN
3.0000 mL | Freq: Four times a day (QID) | RESPIRATORY_TRACT | Status: DC | PRN
  Filled 2019-10-20: qty 3

## 2019-10-20 NOTE — Progress Notes (Signed)
Patient with elevated bp alerted MD and Dr.Hilty to input orders as appropriate. Patient c/o not sleeping well due to coughing last hs will administer prn cough med for him,his secretions has been white foamy when he was sleeping this morning. Asking for food and drink constantly is not on fluid restrictions this point verified with MD rounding team this am.

## 2019-10-20 NOTE — Progress Notes (Signed)
   Subjective: Pt has improved this morning and is feeling well overall. Reports cough with mild sputum production this morning but denies fevers, chills, chest pain, and SOB.  Objective:  Vital signs in last 24 hours: Vitals:   10/20/19 0721 10/20/19 0956 10/20/19 1114 10/20/19 1146  BP:  (!) 172/124 (!) 162/98 (!) 170/107  Pulse:  71 (!) 57 (!) 56  Resp:  20 18 20   Temp:  97.7 F (36.5 C) 98.7 F (37.1 C)   TempSrc:      SpO2: 96% 97% 97% 96%  Weight:      Height:       General: Pt is in NAD HEENT: [place], AT. Foamy white saliva from pt's mouth Cardiac: RRR. No murmurs, rubs, or gallops Respiratory: End-expiratory wheeze auscultated BL. Expiration-to-inspiration ratio 1:3 and at times 1:4. Barrel chest Abdominal: Abdomen is soft and non-tender with normoactive bowel sounds MSK: spontaneously moving all extremities Neuro: Pt is somnolent and had to be woken several times but responsive to questions. Skin: warm and dry. Erythema diffusely present on chest and face  Assessment/Plan:  Principal Problem:   COPD exacerbation ([place]) Active Problems:   Cocaine use   Chronic diastolic CHF (congestive heart failure) ([facility])   Sinus bradycardia  Pt is 54-yr-old man with PMH significant for CHF, COPD, HTN, and IV drug use with chief complaint of worsened SOB, cough, and sputum production are consistent with COPD exacerbation. SOB has improved on antibiotics, prednisone, and Duoneb. Pt's mother was informed of improving status.  COPD exacerbation: Pt denies SOB and chest pain. Improving on current regimen. -Continue prednisone 40 mg -Continue azithromycin 500 mg QD -Continue Duoneb 3 mL q6h PRN  Bradycardia: Per cardiology note, bradycardia thought to be due to underlying respiratory disease. Small, possible vegetation noted on mitral valve with mild regurgitation on echo from 2018. Echo performed this morning to assess for structural causes of bradycardia. Results pending. -Follow up echo  results  Acute encephalopathy: Pt somnolent this morning but responsive to questions. Improved from previous  Polysubstance use Alcohol Use Disorder: Hx of cocaine and heroin use. Daily 2-3 beer drinks. UDS positive for cocaine and negative for opiates. -CIWA w/o Ativan -Thiamine 123XX123 mg QD -Folic acid 1 mg QD  Chronic diastolic heart failure: TEE from 2018 showed an EF 55-60% with grade 2 diastolic dysfunction. Mild pulmonary edema on X-ray. BNP 288.6. 2.85 L UOP yesterday -Continue IV furosemide 40 mg QD -I/Os -Daily weights  Transaminitis: AST/ALT elevated on admission. Hepatitis panel returned positive for HCV Ab. HIV screen negative. Follow up HCV RNA quant to assess pt for hepatitis. -CIWA -Follow up HCV RNA quant  Diet: -Heart healthy/carb-modified  DVT prophylaxis: -Lovenox 40 mg SQ  CODE STATUS: FULL  Prior to Admission Living Arrangement: motel Anticipated Discharge Location: motel Barriers to Discharge: Pt is homeless. Pt's mother is concerned about his housing situation and wanted to know if someone in the hospital could help him find a place to live as she is unable to help him Dispo: Anticipated discharge tomorrow  Josepha Pigg, Medical Student 10/20/2019, 1:07 PM Pager: [phone]

## 2019-10-20 NOTE — Progress Notes (Signed)
MD informed of continued elevated bp despite addition of bp meds diasystolic remains elevated above 100 chronically. No further orders noted.

## 2019-10-20 NOTE — Progress Notes (Signed)
  Echocardiogram 2D Echocardiogram has been performed.  [name] 10/20/2019, 9:44 AM

## 2019-10-20 NOTE — Progress Notes (Signed)
Pt's HR down to 40's nonsustained, pt c/o SOB and expiratory wheezing and coughing. Dr. Court Joy notified, order for duoneb and robitussin obtained and given, meds were effective, pt  refused vital signs requesting more food, pt already got several snacks since the start of shift.

## 2019-10-20 NOTE — Progress Notes (Signed)
DAILY PROGRESS NOTE   Patient Name: [name] Date of Encounter: 10/20/2019 Cardiologist: Ena Dawley, MD  Chief Complaint   Upset that he didn't get ginger ale  Patient Profile   [name] is a 54 y.o. male with a hx of  polysubstance abuse, history of fungemia treated in 2018, hypertension, tobacco abuse, COPD, chronic low back pain and history of noncompliance who is being seen today for the evaluation of bradycardia at the request of Dr. Jeanell Sparrow.  Subjective   Sustained bradycardia overnight - asymptomatic. HR ranged from 40's to mid-50s. Diuresed 2L negative. Echo pending today.  Objective   Vitals:   10/19/19 2005 10/20/19 0002 10/20/19 0605 10/20/19 0721  BP:   137/84   Pulse:   (!) 54   Resp:   20   Temp:   98.5 F (36.9 C)   TempSrc:      SpO2: 96% 97% 100% 96%  Weight:   90.7 kg   Height:        Intake/Output Summary (Last 24 hours) at 10/20/2019 0851 Last data filed at 10/20/2019 0700 Gross per 24 hour  Intake 711.54 ml  Output 2850 ml  Net -2138.46 ml   Filed Weights   10/19/19 0639 10/19/19 1300 10/20/19 0605  Weight: 99.8 kg 91.5 kg 90.7 kg    Physical Exam   General appearance: alert and no distress Neck: no carotid bruit, no JVD and thyroid not enlarged, symmetric, no tenderness/mass/nodules Lungs: wheezes bilaterally Heart: regular rate and rhythm Abdomen: soft, non-tender; bowel sounds normal; no masses,  no organomegaly and overweight Extremities: extremities normal, atraumatic, no cyanosis or edema Pulses: 2+ and symmetric Skin: Skin color, texture, turgor normal. No rashes or lesions Neurologic: Mental status: Somnolent Psych: frustrated  Inpatient Medications    Scheduled Meds: . azithromycin  500 mg Oral Daily  . enoxaparin (LOVENOX) injection  40 mg Subcutaneous Q24H  . folic acid  1 mg Oral Daily  . furosemide  40 mg Intravenous Daily  . ipratropium-albuterol  3 mL Nebulization QID  . multivitamin with  minerals  1 tablet Oral Daily  . predniSONE  40 mg Oral Q breakfast  . thiamine  100 mg Oral Daily   Or  . thiamine  100 mg Intravenous Daily  . umeclidinium bromide  1 puff Inhalation Daily    Continuous Infusions: . azithromycin 250 mL/hr at 10/19/19 1411    PRN Meds: guaiFENesin-dextromethorphan   Labs   Results for orders placed or performed during the hospital encounter of 10/19/19 (from the past 48 hour(s))  CBC with Differential     Status: Abnormal   Collection Time: 10/19/19  7:07 AM  Result Value Ref Range   WBC 9.9 4.0 - 10.5 K/uL   RBC 4.43 4.22 - 5.81 MIL/uL   Hemoglobin 15.9 13.0 - 17.0 g/dL   HCT 47.5 39.0 - 52.0 %   MCV 107.2 (H) 80.0 - 100.0 fL   MCH 35.9 (H) 26.0 - 34.0 pg   MCHC 33.5 30.0 - 36.0 g/dL   RDW 13.9 11.5 - 15.5 %   Platelets 207 150 - 400 K/uL   nRBC 0.0 0.0 - 0.2 %   Neutrophils Relative % 56 %   Neutro Abs 5.5 1.7 - 7.7 K/uL   Lymphocytes Relative 34 %   Lymphs Abs 3.4 0.7 - 4.0 K/uL   Monocytes Relative 8 %   Monocytes Absolute 0.8 0.1 - 1.0 K/uL   Eosinophils Relative 1 %   Eosinophils  Absolute 0.1 0.0 - 0.5 K/uL   Basophils Relative 0 %   Basophils Absolute 0.0 0.0 - 0.1 K/uL   Immature Granulocytes 1 %   Abs Immature Granulocytes 0.10 (H) 0.00 - 0.07 K/uL    Comment: Performed at [facility] [address] [place], [place] [address]  Brain natriuretic peptide     Status: Abnormal   Collection Time: 10/19/19  7:07 AM  Result Value Ref Range   B Natriuretic Peptide 288.6 (H) 0.0 - 100.0 pg/mL    Comment: Performed at [facility] [address], [place], [place] [address]  POCT I-Stat EG7     Status: Abnormal   Collection Time: 10/19/19  7:27 AM  Result Value Ref Range   pH, Ven 7.421 7.250 - 7.430   pCO2, Ven 52.5 44.0 - 60.0 mmHg   pO2, Ven 99.0 (H) 32.0 - 45.0 mmHg   Bicarbonate 34.2 (H) 20.0 - 28.0 mmol/L   TCO2 36 (H) 22 - 32 mmol/L   O2 Saturation 98.0 %   Acid-Base Excess 8.0 (H) 0.0 - 2.0 mmol/L    Sodium 134 (L) 135 - 145 mmol/L   Potassium 3.8 3.5 - 5.1 mmol/L   Calcium, Ion 1.01 (L) 1.15 - 1.40 mmol/L   HCT 45.0 39.0 - 52.0 %   Hemoglobin 15.3 13.0 - 17.0 g/dL   Sample type VENOUS   Troponin I (High Sensitivity)     Status: Abnormal   Collection Time: 10/19/19  7:49 AM  Result Value Ref Range   Troponin I (High Sensitivity) 27 (H) <18 ng/L    Comment: (NOTE) Elevated high sensitivity troponin I (hsTnI) values and significant  changes across serial measurements may suggest ACS but many other  chronic and acute conditions are known to elevate hsTnI results.  Refer to the "Links" section for chest pain algorithms and additional  guidance. Performed at [facility], [place] [address] [place], [place] [address]   SARS Coronavirus 2 by RT PCR (hospital order, performed in [facility] hospital lab) Nasopharyngeal Nasopharyngeal Swab     Status: None   Collection Time: 10/19/19  7:50 AM   Specimen: Nasopharyngeal Swab  Result Value Ref Range   SARS Coronavirus 2 NEGATIVE NEGATIVE    Comment: (NOTE) SARS-CoV-2 target nucleic acids are NOT DETECTED. The SARS-CoV-2 RNA is generally detectable in upper and lower respiratory specimens during the acute phase of infection. The lowest concentration of SARS-CoV-2 viral copies this assay can detect is 250 copies / mL. A negative result does not preclude SARS-CoV-2 infection and should not be used as the sole basis for treatment or other patient management decisions.  A negative result may occur with improper specimen collection / handling, submission of specimen other than nasopharyngeal swab, presence of viral mutation(s) within the areas targeted by this assay, and inadequate number of viral copies (<250 copies / mL). A negative result must be combined with clinical observations, patient history, and epidemiological information. Fact Sheet for Patients:   StrictlyIdeas.no Fact Sheet for Healthcare  Providers: BankingDealers.co.za This test is not yet approved or cleared  by the [place] FDA and has been authorized for detection and/or diagnosis of SARS-CoV-2 by FDA under an Emergency Use Authorization (EUA).  This EUA will remain in effect (meaning this test can be used) for the duration of the COVID-19 declaration under Section 564(b)(1) of the Act, 21 U.S.C. section 360bbb-3(b)(1), unless the authorization is terminated or revoked sooner. Performed at [facility], [address], [place], [place] [address]   I-STAT 7, (LYTES, BLD GAS, ICA, H+H)     Status: Abnormal   Collection Time: 10/19/19  8:30 AM  Result Value Ref Range   pH, Arterial 7.320 (L) 7.350 - 7.450   pCO2 arterial 65.7 (HH) 32.0 - 48.0 mmHg   pO2, Arterial 71 (L) 83.0 - 108.0 mmHg   Bicarbonate 34.0 (H) 20.0 - 28.0 mmol/L   TCO2 36 (H) 22 - 32 mmol/L   O2 Saturation 92.0 %   Acid-Base Excess 5.0 (H) 0.0 - 2.0 mmol/L   Sodium 134 (L) 135 - 145 mmol/L   Potassium 3.7 3.5 - 5.1 mmol/L   Calcium, Ion 1.27 1.15 - 1.40 mmol/L   HCT 44.0 39.0 - 52.0 %   Hemoglobin 15.0 13.0 - 17.0 g/dL   Patient temperature 98.0 F    Collection site Radial    Drawn by RT    Sample type ARTERIAL   Troponin I (High Sensitivity)     Status: Abnormal   Collection Time: 10/19/19  9:53 AM  Result Value Ref Range   Troponin I (High Sensitivity) 29 (H) <18 ng/L    Comment: (NOTE) Elevated high sensitivity troponin I (hsTnI) values and significant  changes across serial measurements may suggest ACS but many other  chronic and acute conditions are known to elevate hsTnI results.  Refer to the "Links" section for chest pain algorithms and additional  guidance. Performed at [facility], [place] [address], [place], [place] [address]   Urine rapid drug screen (hosp performed)     Status: Abnormal   Collection Time: 10/19/19 10:05 AM  Result Value Ref Range   Opiates NONE DETECTED NONE DETECTED    Cocaine POSITIVE (A) NONE DETECTED   Benzodiazepines NONE DETECTED NONE DETECTED   Amphetamines NONE DETECTED NONE DETECTED   Tetrahydrocannabinol NONE DETECTED NONE DETECTED   Barbiturates NONE DETECTED NONE DETECTED    Comment: (NOTE) DRUG SCREEN FOR MEDICAL PURPOSES ONLY.  IF CONFIRMATION IS NEEDED FOR ANY PURPOSE, NOTIFY LAB WITHIN 5 DAYS. LOWEST DETECTABLE LIMITS FOR URINE DRUG SCREEN Drug Class                     Cutoff (ng/mL) Amphetamine and metabolites    1000 Barbiturate and metabolites    200 Benzodiazepine                 [id number] Tricyclics and metabolites     300 Opiates and metabolites        300 Cocaine and metabolites        300 THC                            50 Performed at [facility], [place] [address], [place], [place] [address]   Comprehensive metabolic panel     Status: Abnormal   Collection Time: 10/19/19 10:47 AM  Result Value Ref Range   Sodium 134 (L) 135 - 145 mmol/L   Potassium 4.1 3.5 - 5.1 mmol/L    Comment: SLIGHT HEMOLYSIS   Chloride 97 (L) 98 - 111 mmol/L   CO2 28 22 - 32 mmol/L   Glucose, Bld 191 (H) 70 - 99 mg/dL    Comment: Glucose reference range applies only to samples taken after fasting for at least 8 hours.   BUN 11 6 - 20 mg/dL   Creatinine, Ser 0.84 0.61 - 1.24 mg/dL  Calcium 8.7 (L) 8.9 - 10.3 mg/dL   Total Protein 7.2 6.5 - 8.1 g/dL   Albumin 3.5 3.5 - 5.0 g/dL   AST 68 (H) 15 - 41 U/L   ALT 116 (H) 0 - 44 U/L   Alkaline Phosphatase 80 38 - 126 U/L   Total Bilirubin 0.9 0.3 - 1.2 mg/dL   GFR calc non Af Amer >60 >60 mL/min   GFR calc Af Amer >60 >60 mL/min   Anion gap 9 5 - 15    Comment: Performed at [facility] [address], [place], [place] [address]  HIV Antibody (routine testing w rflx)     Status: None   Collection Time: 10/19/19 11:37 AM  Result Value Ref Range   HIV Screen 4th Generation wRfx Non Reactive Non Reactive    Comment: Performed at [facility], [place] [address] [place], [place]  [address]  Glucose, capillary     Status: Abnormal   Collection Time: 10/19/19 12:35 PM  Result Value Ref Range   Glucose-Capillary 213 (H) 70 - 99 mg/dL    Comment: Glucose reference range applies only to samples taken after fasting for at least 8 hours.  Magnesium     Status: None   Collection Time: 10/19/19 12:57 PM  Result Value Ref Range   Magnesium 1.8 1.7 - 2.4 mg/dL    Comment: Performed at [facility], [place] [address] [place], [place] [address]  Phosphorus     Status: None   Collection Time: 10/19/19 12:57 PM  Result Value Ref Range   Phosphorus 4.0 2.5 - 4.6 mg/dL    Comment: Performed at [facility], [place] [address], [place], [place] [address]  Hepatitis panel, acute     Status: Abnormal   Collection Time: 10/19/19  2:37 PM  Result Value Ref Range   Hepatitis B Surface Ag NON REACTIVE NON REACTIVE   HCV Ab Reactive (A) NON REACTIVE    Comment: (NOTE) The CDC recommends that a Reactive HCV antibody result be followed up  with a HCV Nucleic Acid Amplification test.    Hep A IgM NON REACTIVE NON REACTIVE   Hep B C IgM NON REACTIVE NON REACTIVE    Comment: Performed at [facility], [place] [address] [place], [place] [address]  CBC     Status: Abnormal   Collection Time: 10/20/19  4:34 AM  Result Value Ref Range   WBC 9.6 4.0 - 10.5 K/uL   RBC 4.29 4.22 - 5.81 MIL/uL   Hemoglobin 15.4 13.0 - 17.0 g/dL   HCT 46.4 39.0 - 52.0 %   MCV 108.2 (H) 80.0 - 100.0 fL   MCH 35.9 (H) 26.0 - 34.0 pg   MCHC 33.2 30.0 - 36.0 g/dL   RDW 14.3 11.5 - 15.5 %   Platelets 185 150 - 400 K/uL   nRBC 0.0 0.0 - 0.2 %    Comment: Performed at [facility], [place] [address], [place], [place] [id number]  Basic metabolic panel     Status: Abnormal   Collection Time: 10/20/19  4:34 AM  Result Value Ref Range   Sodium 138 135 - 145 mmol/L   Potassium 3.6 3.5 - 5.1 mmol/L   Chloride 95 (L) 98 - 111 mmol/L   CO2 34 (H) 22 - 32 mmol/L   Glucose, Bld 117 (H) 70 - 99 mg/dL     Comment: Glucose reference range applies only to samples taken  after fasting for at least 8 hours.   BUN 11 6 - 20 mg/dL   Creatinine, Ser 0.70 0.61 - 1.24 mg/dL   Calcium 8.7 (L) 8.9 - 10.3 mg/dL   GFR calc non Af Amer >60 >60 mL/min   GFR calc Af Amer >60 >60 mL/min   Anion gap 9 5 - 15    Comment: Performed at [facility] [address], [place], [place] [address]  Hemoglobin A1c     Status: Abnormal   Collection Time: 10/20/19  4:34 AM  Result Value Ref Range   Hgb A1c MFr Bld 6.4 (H) 4.8 - 5.6 %    Comment: (NOTE) Pre diabetes:          5.7%-6.4% Diabetes:              >6.4% Glycemic control for   <7.0% adults with diabetes    Mean Plasma Glucose 136.98 mg/dL    Comment: Performed at [facility] [address] [place], [place] [address]  Lipid panel     Status: None   Collection Time: 10/20/19  4:34 AM  Result Value Ref Range   Cholesterol 140 0 - 200 mg/dL   Triglycerides 47 <150 mg/dL   HDL 59 >40 mg/dL   Total CHOL/HDL Ratio 2.4 RATIO   VLDL 9 0 - 40 mg/dL   LDL Cholesterol 72 0 - 99 mg/dL    Comment:        Total Cholesterol/HDL:CHD Risk Coronary Heart Disease Risk Table                     Men   Women  1/2 Average Risk   3.4   3.3  Average Risk       5.0   4.4  2 X Average Risk   9.6   7.1  3 X Average Risk  23.4   11.0        Use the calculated Patient Ratio above and the CHD Risk Table to determine the patient's CHD Risk.        ATP III CLASSIFICATION (LDL):  <100     mg/dL   Optimal  100-129  mg/dL   Near or Above                    Optimal  130-159  mg/dL   Borderline  160-189  mg/dL   High  >190     mg/dL   Very High Performed at [facility] [address] [place], [place] [address]     ECG   N/A - Personally Reviewed  Telemetry   Sinus rhythm and sinus bradycardia - Personally Reviewed  Radiology    DG Chest Port 1 View  Result Date: 10/19/2019 CLINICAL DATA:  Dyspnea EXAM: PORTABLE CHEST 1 VIEW COMPARISON:   10/17/2019 chest radiograph. FINDINGS: Stable cardiomediastinal silhouette with mild cardiomegaly. No pneumothorax. No pleural effusion. Mild diffuse prominence of the parahilar interstitial markings. IMPRESSION: Mild cardiomegaly with mild diffuse prominence of the parahilar interstitial markings, suggesting mild pulmonary edema. Electronically Signed   By: Ilona Sorrel M.D.   On: 10/19/2019 08:05    Cardiac Studies   Echo pending  Assessment   Principal Problem:   COPD exacerbation ([facility]) Active Problems:   Cocaine use   Chronic diastolic CHF (congestive heart failure) (HCC)   Sinus bradycardia   Plan   1. Asymptomatic sinus bradycardia overnight, seems worse when sleeping/somnolent, suspect vagal-mediated. Echo  pending today, will follow-up on this tomorrow. Avoid AVN blocking meds.  Time Spent Directly with Patient:  I have spent a total of 25 minutes with the patient reviewing hospital notes, telemetry, EKGs, labs and examining the patient as well as establishing an assessment and plan that was discussed personally with the patient.  > 50% of time was spent in direct patient care.  Length of Stay:  LOS: 0 days   Pixie Casino, MD, [facility], [place] Director of the Advanced Lipid Disorders &  Cardiovascular Risk Reduction Clinic Diplomate of the American Board of Clinical Lipidology Attending Cardiologist  Direct Dial: [phone]  Fax: [phone]  Website:  [email or web address] [email or web address] 10/20/2019, 8:51 AM

## 2019-10-20 NOTE — Plan of Care (Signed)
  Problem: Education: Goal: Knowledge of General Education information will improve Description: Including pain rating scale, medication(s)/side effects and non-pharmacologic comfort measures Outcome: Progressing   Problem: Health Behavior/Discharge Planning: Goal: Ability to manage health-related needs will improve Outcome: Progressing   Problem: Clinical Measurements: Goal: Ability to maintain clinical measurements within normal limits will improve Outcome: Progressing Goal: Will remain free from infection Outcome: Progressing Goal: Diagnostic test results will improve Outcome: Progressing Goal: Respiratory complications will improve Outcome: Progressing Goal: Cardiovascular complication will be avoided Outcome: Progressing   Problem: Activity: Goal: Risk for activity intolerance will decrease Outcome: Progressing   Problem: Nutrition: Goal: Adequate nutrition will be maintained Outcome: Progressing   Problem: Coping: Goal: Level of anxiety will decrease Outcome: Progressing   Problem: Elimination: Goal: Will not experience complications related to bowel motility Outcome: Progressing Goal: Will not experience complications related to urinary retention Outcome: Progressing   Problem: Pain Managment: Goal: General experience of comfort will improve Outcome: Progressing   Problem: Safety: Goal: Ability to remain free from injury will improve Outcome: Progressing   Problem: Skin Integrity: Goal: Risk for impaired skin integrity will decrease Outcome: Progressing   Problem: Education: Goal: Ability to demonstrate management of disease process will improve Outcome: Progressing Goal: Ability to verbalize understanding of medication therapies will improve Outcome: Progressing Goal: Individualized Educational Video(s) Outcome: Progressing   Problem: Activity: Goal: Capacity to carry out activities will improve Outcome: Progressing   Problem: Cardiac: Goal:  Ability to achieve and maintain adequate cardiopulmonary perfusion will improve Outcome: Progressing   Problem: Education: Goal: Knowledge of disease or condition will improve Outcome: Progressing Goal: Knowledge of the prescribed therapeutic regimen will improve Outcome: Progressing Goal: Individualized Educational Video(s) Outcome: Progressing   Problem: Activity: Goal: Ability to tolerate increased activity will improve Outcome: Progressing Goal: Will verbalize the importance of balancing activity with adequate rest periods Outcome: Progressing   Problem: Respiratory: Goal: Ability to maintain a clear airway will improve Outcome: Progressing Goal: Levels of oxygenation will improve Outcome: Progressing Goal: Ability to maintain adequate ventilation will improve Outcome: Progressing   

## 2019-10-21 DIAGNOSIS — I10 Essential (primary) hypertension: Secondary | ICD-10-CM | POA: Diagnosis present

## 2019-10-21 DIAGNOSIS — I472 Ventricular tachycardia: Secondary | ICD-10-CM

## 2019-10-21 LAB — CBC
HCT: 50 % (ref 39.0–52.0)
Hemoglobin: 17 g/dL (ref 13.0–17.0)
MCH: 35.6 pg — ABNORMAL HIGH (ref 26.0–34.0)
MCHC: 34 g/dL (ref 30.0–36.0)
MCV: 104.8 fL — ABNORMAL HIGH (ref 80.0–100.0)
Platelets: 180 10*3/uL (ref 150–400)
RBC: 4.77 MIL/uL (ref 4.22–5.81)
RDW: 13.7 % (ref 11.5–15.5)
WBC: 7.5 10*3/uL (ref 4.0–10.5)
nRBC: 0 % (ref 0.0–0.2)

## 2019-10-21 LAB — COMPREHENSIVE METABOLIC PANEL
ALT: 210 U/L — ABNORMAL HIGH (ref 0–44)
AST: 134 U/L — ABNORMAL HIGH (ref 15–41)
Albumin: 3.4 g/dL — ABNORMAL LOW (ref 3.5–5.0)
Alkaline Phosphatase: 79 U/L (ref 38–126)
Anion gap: 11 (ref 5–15)
BUN: 12 mg/dL (ref 6–20)
CO2: 32 mmol/L (ref 22–32)
Calcium: 8.7 mg/dL — ABNORMAL LOW (ref 8.9–10.3)
Chloride: 93 mmol/L — ABNORMAL LOW (ref 98–111)
Creatinine, Ser: 0.85 mg/dL (ref 0.61–1.24)
GFR calc Af Amer: >60 mL/min (ref >60)
GFR calc non Af Amer: >60 mL/min (ref >60)
Glucose, Bld: 214 mg/dL — ABNORMAL HIGH (ref 70–99)
Potassium: 3.2 mmol/L — ABNORMAL LOW (ref 3.5–5.1)
Sodium: 136 mmol/L (ref 135–145)
Total Bilirubin: 0.9 mg/dL (ref 0.3–1.2)
Total Protein: 7.3 g/dL (ref 6.5–8.1)

## 2019-10-21 LAB — HCV RNA QUANT

## 2019-10-21 LAB — MAGNESIUM: Magnesium: 1.8 mg/dL (ref 1.7–2.4)

## 2019-10-21 MED ORDER — AZITHROMYCIN 250 MG PO TABS
250.0000 mg | ORAL_TABLET | Freq: Every day | ORAL | Status: DC
  Administered 2019-10-21: 250 mg via ORAL
  Filled 2019-10-21: qty 1

## 2019-10-21 MED ORDER — POTASSIUM CHLORIDE CRYS ER 20 MEQ PO TBCR
40.0000 meq | EXTENDED_RELEASE_TABLET | Freq: Once | ORAL | Status: AC
  Administered 2019-10-21: 40 meq via ORAL
  Filled 2019-10-21: qty 2

## 2019-10-21 MED ORDER — ALBUTEROL SULFATE HFA 108 (90 BASE) MCG/ACT IN AERS
2.0000 | INHALATION_SPRAY | Freq: Four times a day (QID) | RESPIRATORY_TRACT | 0 refills | Status: DC | PRN
Start: 2019-10-21 — End: 2019-12-02

## 2019-10-21 MED ORDER — PREDNISONE 20 MG PO TABS: 40.0000 mg | ORAL_TABLET | Freq: Every day | ORAL | 0 refills | Status: AC

## 2019-10-21 MED ORDER — AMLODIPINE BESYLATE 5 MG PO TABS: 5.0000 mg | ORAL_TABLET | Freq: Every day | ORAL | 0 refills | Status: DC

## 2019-10-21 MED ORDER — AMLODIPINE BESYLATE 5 MG PO TABS
5.0000 mg | ORAL_TABLET | Freq: Every day | ORAL | Status: DC
  Administered 2019-10-21: 5 mg via ORAL
  Filled 2019-10-21: qty 1

## 2019-10-21 MED ORDER — AZITHROMYCIN 250 MG PO TABS: ORAL_TABLET | ORAL | 0 refills | Status: DC

## 2019-10-21 MED ORDER — SPIRIVA HANDIHALER 18 MCG IN CAPS
18.0000 ug | ORAL_CAPSULE | Freq: Every day | RESPIRATORY_TRACT | 0 refills | Status: DC
Start: 2019-10-21 — End: 2019-12-02

## 2019-10-21 MED FILL — SPIRIVA 18 MCG CP-HANDIHALE: 18 | 30 days supply | Qty: 30 | Fill #0

## 2019-10-21 MED FILL — AZITHROMYCIN 250 MG TABLET: 250 | 2 days supply | Qty: 2 | Fill #0

## 2019-10-21 MED FILL — predniSONE 20 MG TABS: 20 | 2 days supply | Qty: 4 | Fill #0

## 2019-10-21 MED FILL — AMLODIPINE BESYLATE 5 MG TA: 5 | 30 days supply | Qty: 30 | Fill #0

## 2019-10-21 NOTE — Progress Notes (Addendum)
Inpatient Diabetes Program Recommendations  AACE/ADA: New Consensus Statement on Inpatient Glycemic Control   Target Ranges:  Prepandial:   less than 140 mg/dL      Peak postprandial:   less than 180 mg/dL (1-2 hours)      Critically ill patients:  140 - 180 mg/dL  Results for [name], [name] (MRN [id number]) as of 10/21/2019 11:05  Ref. Range 10/20/2019 04:34 10/21/2019 08:40  Glucose Latest Ref Range: 70 - 99 mg/dL 117 (H) 214 (H)   Results for [name], [name] (MRN [id number]) as of 10/21/2019 11:05  Ref. Range 10/12/2015 18:45 02/26/2017 06:53 11/11/2017 15:19 11/11/2017 15:19 10/20/2019 04:34  Hemoglobin A1C Latest Ref Range: 4.8 - 5.6 % 6.3 (H) 6.3 (H) Pend 5.5 6.4 (H)   Review of Glycemic Control  Diabetes history: Prediabetes (per PCP note on 11/11/2017) Outpatient Diabetes medications: None Current orders for Inpatient glycemic control: None; Prednisone 40 mg QAM  Inpatient Diabetes Program Recommendations:   Insulin-Correction: If steroids are continued, please consider ordering CBGs AC&HS with Novolog 0-9 units TID with meals and Novolog 0-5 units QHS.  HgbA1C: A1C 6.4% on 10/20/2019 indicating an average glucose of 137 mg/dl over the past 2-3 months. Prior A1C 5.5% on 11/11/2017 and per office note on 11/11/2017 by R. Altamease Oiler, [place] patient has prediabetes hx. Noted patient has Prednisone listed on home medication list. If patient has taken Predisone in past 2-3 months then anticipate steroids contributed to higher A1C. Recommend patient follow up with PCP regarding glycemic control.  Addendum 10/21/19@11 :55-Went by to speak with patient regarding A1C. Once introduced self, informed patient I wanted to talk with him about preDM hx and his A1C. Patient stated "Margarita Sermons are kicking me out of the hospital so none of that matters. I am going to end up right back on the street and back in the hospital if I have to leave today. I have no where to go and nobody gives a fuck." Patient reports that he  does not feel he should be discharged today because he is "still sick". Encouraged patient to talk with attending provider regarding his concerns about being discharged. Asked patient if TOC has spoke with him regarding his concerns about not having anywhere to go and patient stated "they can't do anything to help me and nobody gives a fuck about me or where I go anyway". Patient continued to use profane language throughout the conversation and when the conversation was directed back to his A1C and preDM, patient stated " I told you it doesn't matter about that and I don't want to hear it."  Attempted to provide emotional support and encouragement but patient continued to use profane language as he expressed his opinion about being discharged today. Again encouraged patient to talk with attending provider regarding his concerns and ended our conversation.   Thanks, Barnie Alderman, RN, MSN, CDE Diabetes Coordinator Inpatient Diabetes Program [phone] (Team Pager from 8am to 5pm)

## 2019-10-21 NOTE — Discharge Instructions (Signed)

## 2019-10-21 NOTE — Plan of Care (Signed)
  Problem: Education: °Goal: Knowledge of General Education information will improve °Description: Including pain rating scale, medication(s)/side effects and non-pharmacologic comfort measures °Outcome: Adequate for Discharge °  °Problem: Health Behavior/Discharge Planning: °Goal: Ability to manage health-related needs will improve °Outcome: Adequate for Discharge °  °Problem: Clinical Measurements: °Goal: Ability to maintain clinical measurements within normal limits will improve °Outcome: Adequate for Discharge °Goal: Will remain free from infection °Outcome: Adequate for Discharge °Goal: Diagnostic test results will improve °Outcome: Adequate for Discharge °Goal: Respiratory complications will improve °Outcome: Adequate for Discharge °Goal: Cardiovascular complication will be avoided °Outcome: Adequate for Discharge °  °Problem: Activity: °Goal: Risk for activity intolerance will decrease °Outcome: Adequate for Discharge °  °Problem: Nutrition: °Goal: Adequate nutrition will be maintained °Outcome: Adequate for Discharge °  °Problem: Coping: °Goal: Level of anxiety will decrease °Outcome: Adequate for Discharge °  °Problem: Elimination: °Goal: Will not experience complications related to bowel motility °Outcome: Adequate for Discharge °Goal: Will not experience complications related to urinary retention °Outcome: Adequate for Discharge °  °Problem: Pain Managment: °Goal: General experience of comfort will improve °Outcome: Adequate for Discharge °  °Problem: Safety: °Goal: Ability to remain free from injury will improve °Outcome: Adequate for Discharge °  °Problem: Skin Integrity: °Goal: Risk for impaired skin integrity will decrease °Outcome: Adequate for Discharge °  °Problem: Education: °Goal: Ability to demonstrate management of disease process will improve °Outcome: Adequate for Discharge °Goal: Ability to verbalize understanding of medication therapies will improve °Outcome: Adequate for Discharge °Goal:  Individualized Educational Video(s) °Outcome: Adequate for Discharge °  °Problem: Activity: °Goal: Capacity to carry out activities will improve °Outcome: Adequate for Discharge °  °Problem: Cardiac: °Goal: Ability to achieve and maintain adequate cardiopulmonary perfusion will improve °Outcome: Adequate for Discharge °  °Problem: Education: °Goal: Knowledge of disease or condition will improve °Outcome: Adequate for Discharge °Goal: Knowledge of the prescribed therapeutic regimen will improve °Outcome: Adequate for Discharge °Goal: Individualized Educational Video(s) °Outcome: Adequate for Discharge °  °Problem: Activity: °Goal: Ability to tolerate increased activity will improve °Outcome: Adequate for Discharge °Goal: Will verbalize the importance of balancing activity with adequate rest periods °Outcome: Adequate for Discharge °  °Problem: Respiratory: °Goal: Ability to maintain a clear airway will improve °Outcome: Adequate for Discharge °Goal: Levels of oxygenation will improve °Outcome: Adequate for Discharge °Goal: Ability to maintain adequate ventilation will improve °Outcome: Adequate for Discharge °  °

## 2019-10-21 NOTE — Progress Notes (Signed)
Patient has received TOC meds. Patient informed that he has been discharged however he expresses frustration that he is not ready to be discharged and is asking for the Doctors to return to bedside. Page sent and MDs aware of patient request.

## 2019-10-21 NOTE — Discharge Summary (Signed)
Name: [name] MRN: [id number] DOB: December 04, 1965 54 y.o. PCP: Tawny Asal  Date of Admission: 10/19/2019  6:29 AM Date of Discharge: 6/2/20216/06/2019 Attending Physician: Gilles Chiquito  Discharge Diagnosis: 1. COPD exacerbation 2. Compensated heart failure 3. Sinus bradycardia 4. Polysubstance use/alcohol use disorder  5. Transaminitis   Discharge Medications: Allergies as of 10/21/2019      Reactions   Penicillins    From childhood: Has patient had a PCN reaction causing immediate rash, facial/tongue/throat swelling, SOB or lightheadedness with hypotension: Unknown Has patient had a PCN reaction causing severe rash involving mucus membranes or skin necrosis: Unknown Has patient had a PCN reaction that required hospitalization: Unknown Has patient had a PCN reaction occurring within the last 10 years: No If all of the above answers are "NO", then may proceed with Cephalosporin use.      Medication List    STOP taking these medications   busPIRone 7.5 MG tablet Commonly known as: BUSPAR   cloNIDine 0.2 MG tablet Commonly known as: CATAPRES   levofloxacin 500 MG tablet Commonly known as: Levaquin     TAKE these medications   albuterol 108 (90 Base) MCG/ACT inhaler Commonly known as: VENTOLIN HFA Inhale 2 puffs into the lungs every 4 (four) hours as needed for wheezing or shortness of breath (cough). What changed: Another medication with the same name was changed. Make sure you understand how and when to take each.   albuterol 108 (90 Base) MCG/ACT inhaler Commonly known as: VENTOLIN HFA Inhale 2 puffs into the lungs every 6 (six) hours as needed for wheezing or shortness of breath. What changed: how much to take   amLODipine 5 MG tablet Commonly known as: NORVASC Take 1 tablet (5 mg total) by mouth daily.   azithromycin 250 MG tablet Commonly known as: ZITHROMAX Take 1 tablet every day for 2 more days What changed:   how much to  take  how to take this  when to take this  additional instructions   hydrochlorothiazide 12.5 MG tablet Commonly known as: HYDRODIURIL Take 1 tablet (12.5 mg total) by mouth daily.   lisinopril 20 MG tablet Commonly known as: ZESTRIL Take 1 tablet (20 mg total) by mouth daily.   predniSONE 20 MG tablet Commonly known as: DELTASONE Take 2 tablets (40 mg total) by mouth daily with breakfast for 2 days. Start taking on: October 22, 2019 What changed:   medication strength  how much to take  when to take this   Spiriva HandiHaler 18 MCG inhalation capsule Generic drug: tiotropium Place 1 capsule (18 mcg total) into inhaler and inhale daily.       Disposition and follow-up:   [name] was discharged from [facility] in [place] condition.  At the hospital follow up visit please address:  1. COPD exacerbation -Continue azithromycin 250 mg QD and prednisone 20 mg BID for 2 more days -Prescribed albuterol 108 mcg 2 puffs q6h PRN and tiotropium 18 mcg QD for long-term management of COPD  2. Compensated CHF - Treated w/ short course of diuretics - Ensure he continues home meds - Check volume status and start diuresis as needed  3. Transaminitits - Noted to have hepatitis C antibody - No history of treatment - Viral RNA load pending  4. Sinus bradycardia - Noted to have sinus bradycardia, Cardiology it to be due to underlying pulmonary disease - Make sure to avoid AV nodal blocking meds  5. Polysubstance use/alcohol use disorder  -  Not interested in cessation at time of discharge - Please continue to encourse cesation  6.  Labs / imaging needed at time of follow-up: cbc, cmp  7.  Pending labs/ test needing follow-up: HCV RNA quant  Follow-up Appointments: Follow-up Information    Dorothy Spark, MD .   Specialty: Cardiology Contact information: [address] [place] [address] [place]. Go on 11/04/2019.   Why: 10:30 AM Contact information: [place] [address] [place] Hospital Course by problem list: 1. COPD exacerbation: Pt is a 54-yr-old man who presented to the hospital with worsening SOB, worsening cough, and increased sputum production. He had gone to the hospital on 5/29 for the same issue and was prescribed prednisone, antibiotics, and albuterol and advised for admission but left AMA and did not take the prescribed meds. On admission, noted to have new oxygen requirement. CXR showed bilateral interstitial infiltrates and mild cardiomegaly. COVID test was negative. Clinical presentation most consistent with COPD exacerbation. Pt was treated with prednisone, azithromycin, and Duoneb and responded well after 3 days. Discharged w/ recommendation to complete 5 day course of prednisone, azithromycin and start Spiriva.  2. Compensated heart failure: BNP 288.6. Cardiomegaly and BL interstitial infiltrates noted on CXR. Treated with IV furosemide 40 mg QD for 2 days. At discharge, appeared to be euvolemic  3. Polysubstance use/alcohol use disorder: Pt presented with history of alcohol, cocaine and heroine use. On admission, reported recent cocaine use. UDS returned positive for cocaine and negative for opiates. No significant withdrawal symptoms during hospitalization.  4. Transaminitis: AST/ALT found to be elevated on admission. Differential includes alcohol use disorder, NAFLD, and hepatitis. Hepatitis panel returned positive for HCV Ab, HCV RNA quant pending. HIV screen negative.  5. Sinus bradycardia: Noted to have HR of 30-50s on admission. Cardiology consulted. Not considered candidate for pacemaker. Thought to be due to underlying lung disease vs illicit substance use. Advised to avoid AV nodal blocking agents.  Discharge Vitals:   BP (!) 185/114 (BP Location: Right Arm)    Pulse (!) 52   Temp 99.3 F (37.4 C) (Oral)   Resp (!) 22   Ht _0  (1.753 m)   Wt 97 kg   SpO2 94%   BMI 31.57 kg/m   Pertinent Labs, Studies, and Procedures:  CBC Latest Ref Rng & Units 10/21/2019 10/20/2019 10/19/2019  WBC 4.0 - 10.5 K/uL 7.5 9.6 -  Hemoglobin 13.0 - 17.0 g/dL 17.0 15.4 15.0  Hematocrit 39.0 - 52.0 % 50.0 46.4 44.0  Platelets 150 - 400 K/uL 180 185 -   CMP Latest Ref Rng & Units 10/21/2019 10/20/2019 10/19/2019  Glucose 70 - 99 mg/dL 214(H) 117(H) 191(H)  BUN 6 - 20 mg/dL _1 Creatinine 0.61 - 1.24 mg/dL 0.85 0.70 0.84  Sodium 135 - 145 mmol/L 136 138 134(L)  Potassium 3.5 - 5.1 mmol/L 3.2(L) 3.6 4.1  Chloride 98 - 111 mmol/L 93(L) 95(L) 97(L)  CO2 22 - 32 mmol/L 32 34(H) 28  Calcium 8.9 - 10.3 mg/dL 8.7(L) 8.7(L) 8.7(L)  Total Protein 6.5 - 8.1 g/dL 7.3 - 7.2  Total Bilirubin 0.3 - 1.2 mg/dL 0.9 - 0.9  Alkaline Phos 38 - 126 U/L 79 - 80  AST 15 - 41 U/L 134(H) - 68(H)  ALT 0 - 44 U/L 210(H) - 116(H)   EKG: -  Sinus tachycardia -LVH -Inverted T waves in leads I, avL, V5, and V6  CXR: -Stable cardiomediastinal silhouette with mild cardiomegaly -Bilateral interstitial infiltrates -No pneumothorax -No pleural disease  Echo: -EF 50-55%, lower than previous -Mild left atrial dilation, unchanged from previous -IVC normal in size with <50% respiratory variability  BNP    Component Value Date/Time   BNP 288.6 (H) 10/19/2019 0707    ProBNP No results found for: PROBNP   Hepatic Function Latest Ref Rng & Units 10/21/2019 10/19/2019 10/17/2019  Total Protein 6.5 - 8.1 g/dL 7.3 7.2 7.4  Albumin 3.5 - 5.0 g/dL 3.4(L) 3.5 3.6  AST 15 - 41 U/L 134(H) 68(H) 92(H)  ALT 0 - 44 U/L 210(H) 116(H) 133(H)  Alk Phosphatase 38 - 126 U/L 79 80 83  Total Bilirubin 0.3 - 1.2 mg/dL 0.9 0.9 1.0  Bilirubin, Direct 0.1 - 0.5 mg/dL - - -   ABG    Component Value Date/Time   PHART 7.320 (L) 10/19/2019 0830   PCO2ART 65.7 (HH) 10/19/2019 0830   PO2ART 71 (L)  10/19/2019 0830   HCO3 34.0 (H) 10/19/2019 0830   TCO2 36 (H) 10/19/2019 0830   O2SAT 92.0 10/19/2019 0830   Discharge Instructions: Discharge Instructions    Call MD for:  difficulty breathing, headache or visual disturbances   Complete by: As directed    Call MD for:  extreme fatigue   Complete by: As directed    Call MD for:  persistant dizziness or light-headedness   Complete by: As directed    Call MD for:  persistant nausea and vomiting   Complete by: As directed    Call MD for:  redness, tenderness, or signs of infection (pain, swelling, redness, odor or green/yellow discharge around incision site)   Complete by: As directed    Diet - low sodium heart healthy   Complete by: As directed    Discharge instructions   Complete by: As directed    Dear [name]  You came to [place] with COPD exacerbation. We have determined this was caused by not taking your medications and smoking crack/cocaine. Here are our recommendations for you at discharge:  Please stop using cocaine Please take azithromycin and prednisone for 2 more days Please start Spiriva inhaler every day Please take amlodipine 26m every day in addition to your prescribed blood pressure medicines Follow up with your primary care provider on 11/04/19 at 10:30 am  Thank you for choosing [facility].   Increase activity slowly   Complete by: As directed      Signed: LMosetta Anis MD 10/21/2019, 3:05 PM   Pager: [phone]

## 2019-10-21 NOTE — Progress Notes (Signed)
Pt had 6 beats of V tach. Pt asymptomatic. Labs pending.

## 2019-10-21 NOTE — Progress Notes (Signed)
   Subjective: No overnight events. Pt has improved this morning and is feeling well overall. Reports congestion and cough with mild sputum production but denies fevers, chills, chest pain, and SOB.  Objective:  Vital signs in last 24 hours: Vitals:   10/20/19 2359 10/21/19 0329 10/21/19 0526 10/21/19 0856  BP: (!) 163/95 (!) 156/95  (!) 185/114  Pulse: 68 (!) 53  (!) 52  Resp: 19 17  (!) 22  Temp: 98.5 F (36.9 C) 98.2 F (36.8 C)  99.3 F (37.4 C)  TempSrc: Oral Oral  Oral  SpO2: 94% 95%  94%  Weight:   97 kg   Height:       General: Pt is in NAD HEENT: [place], AT. Grossly EOMI  Cardiac: RRR. No murmurs, rubs, or gallops Respiratory: End-expiratory wheeze auscultated BL. Expiration-to-inspiration ratio 1:3 and at times 1:4. Barrel chest Abdominal: Abdomen is soft with normoactive bowel sounds MSK: spontaneously moving all extremities. Walked to the bathroom unassisted Neuro: Pt is somnolent but responsive to questions, improved from previous. Skin: warm and dry. Diaphoresis present on chest. Erythema diffusely present on chest and face  Assessment/Plan:  Principal Problem:   COPD exacerbation (HCC) Active Problems:   Cocaine use   Chronic diastolic CHF (congestive heart failure) (HCC)   Sinus bradycardia   COPD with acute exacerbation (HCC)   Acute respiratory failure with hypoxia and hypercapnia (HCC)  Pt is 54-yr-old man with PMH significant for CHF, COPD, HTN, and IV drug use whose SOB has improved on antibiotics, prednisone, and Duoneb.  COPD exacerbation: Pt denies SOB and chest pain but reports ongoing congestion and productive cough. Improving on current regimen. -Continue prednisone 40 mg for 5 days total (day 3) -Continue azithromycin 500 mg QD for 5 days total (day 2) -Continue Duoneb for 5 days total (day 2)  -Albuterol for chronic COPD management  Bradycardia: Per cardiology note, bradycardia thought to be due to underlying respiratory disease. No  vegetation noted on mitral valve with mild regurgitation on echo from yesterday. Improving  Acute encephalopathy: Pt somnolent this morning but responsive to questions. Improved from previous  Polysubstance use Alcohol Use Disorder: Hx of cocaine and heroin use. Daily 2-3 beer drinks. UDS positive for cocaine and negative for opiates. -CIWA w/o Ativan -Thiamine 123XX123 mg QD -Folic acid 1 mg QD  Compensated heart failure: Echo from yesterday showed an EF of 50-55%, decreased from previous, and mild left atrial dilation, unchanged from previous. No mitral valve vegetation visualized. IVC is normal in size with <50% respiratory variability. Overall, echo unchanged from previous, suggesting compensated HF. 5 L UOP yesterday. -Continue IV furosemide 40 mg QD -I/Os -Daily weights  Transaminitis: AST/ALT elevated on admission. Hepatitis panel returned positive for HCV Ab. HIV screen negative. Follow up HCV RNA quant to assess pt for hepatitis. -CIWA -Follow up HCV RNA quant  Diet: -Heart healthy/carb-modified  DVT prophylaxis: -Lovenox 40 mg SQ  CODE STATUS: FULL  Prior to Admission Living Arrangement: motel Anticipated Discharge Location: motel Barriers to Discharge: Homelessness. Prior to hospital admission pt was living in a motel. Mother was called yesterday, and she expressed concern about his living conditions. She is unable to be his caretaker and wanted to know if someone in the hospital could help her son gain more stable housing. Transition of care team will be contacted to address this issue. Dispo: Anticipated discharge today  Josepha Pigg, Medical Student 10/21/2019, 9:10 AM Pager: [phone]

## 2019-10-21 NOTE — TOC Initial Note (Addendum)
Transition of Care [facility]) - Initial/Assessment Note    Patient Details  Name: [name] MRN: [place]:[id number] Date of Birth: April 25, 1966  [facility]) CM/SW Contact:    Zenon Mayo, RN Phone Number: 10/21/2019, 12:41 PM  Clinical Narrative:                 NCM went to speak with patient, NCM gave him housing resources and shelter resources.  He states he does not want to live in a shelter, he said he does not need the resources and that he will go back to doing what he was doing before, which was staying in a hotel.  NCM left resources in patient room for him.  Patient told NCM that if that is all we had to give him then it is no need to come back with that information.  Expected Discharge Plan: Home/Self Care Barriers to Discharge: No Barriers Identified   Patient Goals and CMS Choice        Expected Discharge Plan and Services Expected Discharge Plan: Home/Self Care   Discharge Planning Services: CM Consult   Living arrangements for the past 2 months: Hotel/Motel Expected Discharge Date: 10/21/19                 DME Agency: NA       HH Arranged: NA          Prior Living Arrangements/Services Living arrangements for the past 2 months: Hotel/Motel Lives with:: Self Patient language and need for interpreter reviewed:: Yes        Need for Family Participation in Patient Care: No (Comment) Care giver support system in place?: No (comment)   Criminal Activity/Legal Involvement Pertinent to Current Situation/Hospitalization: No - Comment as needed  Activities of Daily Living      Permission Sought/Granted                  Emotional Assessment Appearance:: Appears stated age Attitude/Demeanor/Rapport: Complaining Affect (typically observed): (Complaining) Orientation: : Oriented to Self, Oriented to Place, Oriented to  Time, Oriented to Situation Alcohol / Substance Use: Not Applicable Psych Involvement: No  (comment)  Admission diagnosis:  COPD exacerbation ([place]) [J44.1] COPD with acute exacerbation ([place]) [J44.1] Patient Active Problem List   Diagnosis Date Noted   Uncontrolled hypertension 10/21/2019   COPD with acute exacerbation ([place]) 10/20/2019   Acute respiratory failure with hypoxia and hypercapnia (HCC)    COPD exacerbation ([place]) 10/19/2019   Sinus bradycardia    Acute exacerbation of chronic obstructive pulmonary disease (COPD) ([place]) 10/17/2019   Elevated liver enzymes 06/11/2017   Abnormal liver function tests 06/10/2017   Chronic diastolic CHF (congestive heart failure) ([place]) 06/10/2017   Hypokalemia 06/10/2017   Hypomagnesemia 06/10/2017   Vertebral osteomyelitis, chronic ([place]) 05/31/2017   Marijuana use 05/31/2017   Osteomyelitis ([place]) 05/24/2017   Discitis 05/24/2017   Diskitis 03/05/2017   Cocaine use    Alcohol abuse    Substance abuse ([place])    Epidural abscess    Discitis thoracic region 02/26/2017   Thoracic back pain    Tobacco abuse    Essential hypertension    Fungal endocarditis    Suicidal ideation    IVDU (intravenous drug user)    Chronic bilateral low back pain without sciatica    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[place])    Vertebral osteomyelitis ([place]) 02/16/2017   Cocaine abuse ([place]) 10/09/2015   Homeless single person    COPD (chronic obstructive pulmonary disease) ([place]) 10/08/2015  PCP:  Clent Demark, PA-C Pharmacy:   [facility], [address] [place] [address] Phone: [phone] Fax: [facility] [place], [place] [address] [address] Phone: [phone] Fax: [phone]  [facility] [place], [place] - [address] [place] [address] Phone: [phone] Fax:  [phone]     Social Determinants of Health (SDOH) Interventions    Readmission Risk Interventions Readmission Risk Prevention Plan 10/21/2019  Transportation Screening Complete  PCP or Specialist Appt within 3-5 Days Complete  HRI or [place] Complete  Social Work Consult for [place] Planning/Counseling Complete  Palliative Care Screening Complete  Medication Review Press photographer) Complete  Some recent data might be hidden

## 2019-10-21 NOTE — Progress Notes (Signed)
DAILY PROGRESS NOTE   Patient Name: [name] Date of Encounter: 10/21/2019 Cardiologist: Ena Dawley, MD  Chief Complaint   Coughing up phlegm  Patient Profile   [name] is a 54 y.o. male with a hx of  polysubstance abuse, history of fungemia treated in 2018, hypertension, tobacco abuse, COPD, chronic low back pain and history of noncompliance who is being seen today for the evaluation of bradycardia at the request of Dr. Jeanell Sparrow.  Subjective   Short run of NSVT this am (6 beats) - continued asymptomatic bradycardia. Echo yesterday shows LVEF 50-55%, no gross focal WMA's. Still coughing up phlegm, does not want to go home.  Objective   Vitals:   10/20/19 2359 10/21/19 0329 10/21/19 0526 10/21/19 0856  BP: (!) 163/95 (!) 156/95  (!) 185/114  Pulse: 68 (!) 53  (!) 52  Resp: 19 17  (!) 22  Temp: 98.5 F (36.9 C) 98.2 F (36.8 C)  99.3 F (37.4 C)  TempSrc: Oral Oral  Oral  SpO2: 94% 95%  94%  Weight:   97 kg   Height:        Intake/Output Summary (Last 24 hours) at 10/21/2019 [id number] Last data filed at 10/20/2019 2359 Gross per 24 hour  Intake 920 ml  Output 4400 ml  Net -3480 ml   Filed Weights   10/19/19 1300 10/20/19 0605 10/21/19 0526  Weight: 91.5 kg 90.7 kg 97 kg    Physical Exam   General appearance: alert and no distress Neck: no carotid bruit, no JVD and thyroid not enlarged, symmetric, no tenderness/mass/nodules Lungs: wheezes bilaterally Heart: regular rate and rhythm Abdomen: soft, non-tender; bowel sounds normal; no masses,  no organomegaly and overweight Extremities: extremities normal, atraumatic, no cyanosis or edema Pulses: 2+ and symmetric Skin: Skin color, texture, turgor normal. No rashes or lesions Neurologic: Mental status: Somnolent Psych: frustrated  Inpatient Medications    Scheduled Meds: . amLODipine  5 mg Oral Daily  . azithromycin  500 mg Oral Daily  . enoxaparin (LOVENOX) injection  40 mg Subcutaneous  Q24H  . folic acid  1 mg Oral Daily  . hydrochlorothiazide  12.5 mg Oral Daily  . lisinopril  20 mg Oral Daily  . multivitamin with minerals  1 tablet Oral Daily  . predniSONE  40 mg Oral Q breakfast  . thiamine  100 mg Oral Daily   Or  . thiamine  100 mg Intravenous Daily  . umeclidinium bromide  1 puff Inhalation Daily    Continuous Infusions:   PRN Meds: guaiFENesin-dextromethorphan, ipratropium-albuterol   Labs   Results for orders placed or performed during the Bennett encounter of 10/19/19 (from the past 48 hour(s))  Troponin I (High Sensitivity)     Status: Abnormal   Collection Time: 10/19/19  9:53 AM  Result Value Ref Range   Troponin I (High Sensitivity) 29 (H) <18 ng/L    Comment: (NOTE) Elevated high sensitivity troponin I (hsTnI) values and significant  changes across serial measurements may suggest ACS but many other  chronic and acute conditions are known to elevate hsTnI results.  Refer to the "Links" section for chest pain algorithms and additional  guidance. Performed at [facility], [place] [address] [place], [place] [address]   Urine rapid drug screen (hosp performed)     Status: Abnormal   Collection Time: 10/19/19 10:05 AM  Result Value Ref Range   Opiates NONE DETECTED NONE DETECTED   Cocaine POSITIVE (A) NONE DETECTED  Benzodiazepines NONE DETECTED NONE DETECTED   Amphetamines NONE DETECTED NONE DETECTED   Tetrahydrocannabinol NONE DETECTED NONE DETECTED   Barbiturates NONE DETECTED NONE DETECTED    Comment: (NOTE) DRUG SCREEN FOR MEDICAL PURPOSES ONLY.  IF CONFIRMATION IS NEEDED FOR ANY PURPOSE, NOTIFY LAB WITHIN 5 DAYS. LOWEST DETECTABLE LIMITS FOR URINE DRUG SCREEN Drug Class                     Cutoff (ng/mL) Amphetamine and metabolites    1000 Barbiturate and metabolites    200 Benzodiazepine                 [id number] Tricyclics and metabolites     300 Opiates and metabolites        300 Cocaine and metabolites        300 THC                             50 Performed at [facility], [place] [address], [place], [place] [address]   Comprehensive metabolic panel     Status: Abnormal   Collection Time: 10/19/19 10:47 AM  Result Value Ref Range   Sodium 134 (L) 135 - 145 mmol/L   Potassium 4.1 3.5 - 5.1 mmol/L    Comment: SLIGHT HEMOLYSIS   Chloride 97 (L) 98 - 111 mmol/L   CO2 28 22 - 32 mmol/L   Glucose, Bld 191 (H) 70 - 99 mg/dL    Comment: Glucose reference range applies only to samples taken after fasting for at least 8 hours.   BUN 11 6 - 20 mg/dL   Creatinine, Ser 0.84 0.61 - 1.24 mg/dL   Calcium 8.7 (L) 8.9 - 10.3 mg/dL   Total Protein 7.2 6.5 - 8.1 g/dL   Albumin 3.5 3.5 - 5.0 g/dL   AST 68 (H) 15 - 41 U/L   ALT 116 (H) 0 - 44 U/L   Alkaline Phosphatase 80 38 - 126 U/L   Total Bilirubin 0.9 0.3 - 1.2 mg/dL   GFR calc non Af Amer >60 >60 mL/min   GFR calc Af Amer >60 >60 mL/min   Anion gap 9 5 - 15    Comment: Performed at [facility] [address], [place], [place] [address]  HIV Antibody (routine testing w rflx)     Status: None   Collection Time: 10/19/19 11:37 AM  Result Value Ref Range   HIV Screen 4th Generation wRfx Non Reactive Non Reactive    Comment: Performed at [facility], [place] [address] [place], [place] [address]  Glucose, capillary     Status: Abnormal   Collection Time: 10/19/19 12:35 PM  Result Value Ref Range   Glucose-Capillary 213 (H) 70 - 99 mg/dL    Comment: Glucose reference range applies only to samples taken after fasting for at least 8 hours.  Magnesium     Status: None   Collection Time: 10/19/19 12:57 PM  Result Value Ref Range   Magnesium 1.8 1.7 - 2.4 mg/dL    Comment: Performed at [facility], [place] [address], [place], [place] [address]  Phosphorus     Status: None   Collection Time: 10/19/19 12:57 PM  Result Value Ref Range   Phosphorus 4.0 2.5 - 4.6 mg/dL    Comment: Performed at [facility], [place] [address], [place], [place]  [address]  Hepatitis panel, acute  Status: Abnormal   Collection Time: 10/19/19  2:37 PM  Result Value Ref Range   Hepatitis B Surface Ag NON REACTIVE NON REACTIVE   HCV Ab Reactive (A) NON REACTIVE    Comment: (NOTE) The CDC recommends that a Reactive HCV antibody result be followed up  with a HCV Nucleic Acid Amplification test.    Hep A IgM NON REACTIVE NON REACTIVE   Hep B C IgM NON REACTIVE NON REACTIVE    Comment: Performed at [facility] [address], [place], [place] [address]  CBC     Status: Abnormal   Collection Time: 10/20/19  4:34 AM  Result Value Ref Range   WBC 9.6 4.0 - 10.5 K/uL   RBC 4.29 4.22 - 5.81 MIL/uL   Hemoglobin 15.4 13.0 - 17.0 g/dL   HCT 46.4 39.0 - 52.0 %   MCV 108.2 (H) 80.0 - 100.0 fL   MCH 35.9 (H) 26.0 - 34.0 pg   MCHC 33.2 30.0 - 36.0 g/dL   RDW 14.3 11.5 - 15.5 %   Platelets 185 150 - 400 K/uL   nRBC 0.0 0.0 - 0.2 %    Comment: Performed at [facility], [place] [address] [place], [place] [id number]  Basic metabolic panel     Status: Abnormal   Collection Time: 10/20/19  4:34 AM  Result Value Ref Range   Sodium 138 135 - 145 mmol/L   Potassium 3.6 3.5 - 5.1 mmol/L   Chloride 95 (L) 98 - 111 mmol/L   CO2 34 (H) 22 - 32 mmol/L   Glucose, Bld 117 (H) 70 - 99 mg/dL    Comment: Glucose reference range applies only to samples taken after fasting for at least 8 hours.   BUN 11 6 - 20 mg/dL   Creatinine, Ser 0.70 0.61 - 1.24 mg/dL   Calcium 8.7 (L) 8.9 - 10.3 mg/dL   GFR calc non Af Amer >60 >60 mL/min   GFR calc Af Amer >60 >60 mL/min   Anion gap 9 5 - 15    Comment: Performed at [facility] [address] [place], [place] [address]  Hemoglobin A1c     Status: Abnormal   Collection Time: 10/20/19  4:34 AM  Result Value Ref Range   Hgb A1c MFr Bld 6.4 (H) 4.8 - 5.6 %    Comment: (NOTE) Pre diabetes:          5.7%-6.4% Diabetes:              >6.4% Glycemic control for   <7.0% adults with diabetes    Mean Plasma  Glucose 136.98 mg/dL    Comment: Performed at [facility] [address], [place], [place] [address]  Lipid panel     Status: None   Collection Time: 10/20/19  4:34 AM  Result Value Ref Range   Cholesterol 140 0 - 200 mg/dL   Triglycerides 47 <150 mg/dL   HDL 59 >40 mg/dL   Total CHOL/HDL Ratio 2.4 RATIO   VLDL 9 0 - 40 mg/dL   LDL Cholesterol 72 0 - 99 mg/dL    Comment:        Total Cholesterol/HDL:CHD Risk Coronary Heart Disease Risk Table                     Men   Women  1/2 Average Risk   3.4   3.3  Average Risk  5.0   4.4  2 X Average Risk   9.6   7.1  3 X Average Risk  23.4   11.0        Use the calculated Patient Ratio above and the CHD Risk Table to determine the patient's CHD Risk.        ATP III CLASSIFICATION (LDL):  <100     mg/dL   Optimal  100-129  mg/dL   Near or Above                    Optimal  130-159  mg/dL   Borderline  160-189  mg/dL   High  >190     mg/dL   Very High Performed at [facility] [address], [place],  [address]     ECG   N/A - Personally Reviewed  Telemetry   Sinus rhythm and sinus bradycardia, short run of NSVT - Personally Reviewed  Radiology    ECHOCARDIOGRAM COMPLETE  Result Date: 10/20/2019    ECHOCARDIOGRAM REPORT   Patient Name:   [name] Date of Exam: 10/20/2019 Medical Rec #:  [id number]             Height:       69.0 in Accession #:    [id number]            Weight:       199.9 lb Date of Birth:  Aug 27, 1965             BSA:          2.066 m Patient Age:    2 years              BP:           137/84 mmHg Patient Gender: M                     HR:           54 bpm. Exam Location:  Inpatient Procedure: 2D Echo Indications:    518.82 acute respiratory insufficiency  History:        Patient has prior history of Echocardiogram examinations, most                 recent 02/19/2017. CHF, COPD; Risk Factors:Hypertension. Tobacco                 abuse. diskitis. drug use.  Sonographer:    Jannett Celestine RDCS (AE) Referring Phys: [id number] EMILY B MULLEN  Sonographer Comments: limited mobility. see comments IMPRESSIONS  1. Left ventricular ejection fraction, by estimation, is 50 to 55%. The left ventricle has low normal function. The left ventricle has no regional wall motion abnormalities. There is moderate left ventricular hypertrophy. Left ventricular diastolic parameters are indeterminate.  2. Right ventricular systolic function was not well visualized. The right ventricular size is not well visualized. Tricuspid regurgitation signal is inadequate for assessing PA pressure.  3. Left atrial size was mild to moderately dilated.  4. The mitral valve is grossly normal. Trivial mitral valve regurgitation. No evidence of mitral stenosis.  5. The aortic valve has an indeterminant number of cusps. Aortic valve regurgitation is not visualized. Mild aortic valve sclerosis is present, with no evidence of aortic valve stenosis.  6. The inferior vena cava is normal in size with <50% respiratory variability, suggesting right atrial pressure of 8 mmHg. Conclusion(s)/Recommendation(s): Technically difficult study. Grossly normal LV function without focal wall motion abnormalities.  FINDINGS  Left Ventricle: Left ventricular ejection fraction, by estimation, is 50 to 55%. The left ventricle has low normal function. The left ventricle has no regional wall motion abnormalities. The left ventricular internal cavity size was normal in size. There is moderate left ventricular hypertrophy. Left ventricular diastolic parameters are indeterminate. Right Ventricle: The right ventricular size is not well visualized. Right vetricular wall thickness was not assessed. Right ventricular systolic function was not well visualized. Tricuspid regurgitation signal is inadequate for assessing PA pressure. Left Atrium: Left atrial size was mild to moderately dilated. Right Atrium: Right atrial size was normal in size. Pericardium: There is no  evidence of pericardial effusion. Mitral Valve: The mitral valve is grossly normal. Trivial mitral valve regurgitation. No evidence of mitral valve stenosis. Tricuspid Valve: The tricuspid valve is grossly normal. Tricuspid valve regurgitation is trivial. No evidence of tricuspid stenosis. Aortic Valve: The aortic valve has an indeterminant number of cusps. . There is mild thickening and mild calcification of the aortic valve. Aortic valve regurgitation is not visualized. Mild aortic valve sclerosis is present, with no evidence of aortic valve stenosis. There is mild thickening of the aortic valve. There is mild calcification of the aortic valve. Pulmonic Valve: The pulmonic valve was not well visualized. Pulmonic valve regurgitation is not visualized. Aorta: The ascending aorta was not well visualized, the aortic arch was not well visualized and the aortic root is normal in size and structure. Venous: The inferior vena cava is normal in size with less than 50% respiratory variability, suggesting right atrial pressure of 8 mmHg. IAS/Shunts: The atrial septum is grossly normal.  LEFT VENTRICLE PLAX 2D LVIDd:         4.60 cm  Diastology LVIDs:         3.30 cm  LV e' lateral:   8.59 cm/s LV PW:         1.80 cm  LV E/e' lateral: 10.9 LV IVS:        1.20 cm  LV e' medial:    6.42 cm/s LVOT diam:     2.10 cm  LV E/e' medial:  14.5 LV SV:         97 LV SV Index:   47 LVOT Area:     3.46 cm  LEFT ATRIUM              Index       RIGHT ATRIUM           Index LA diam:        4.50 cm  2.18 cm/m  RA Area:     18.40 cm LA Vol (A2C):   116.0 ml 56.16 ml/m RA Volume:   44.90 ml  21.74 ml/m LA Vol (A4C):   46.9 ml  22.71 ml/m LA Biplane Vol: 79.1 ml  38.29 ml/m  AORTIC VALVE LVOT Vmax:   135.00 cm/s LVOT Vmean:  91.400 cm/s LVOT VTI:    0.279 m  AORTA Ao Root diam: 3.50 cm MITRAL VALVE MV Area (PHT): 2.34 cm    SHUNTS MV Decel Time: 324 msec    Systemic VTI:  0.28 m MV E velocity: 93.40 cm/s  Systemic Diam: 2.10 cm Buford Dresser MD Electronically signed by Buford Dresser MD Signature Date/Time: 10/20/2019/12:58:58 PM    Final     Cardiac Studies   Echo above  Assessment   Principal Problem:   COPD exacerbation ([place]) Active Problems:   Cocaine use   Chronic diastolic CHF (congestive heart failure) (  [place])   Sinus bradycardia   COPD with acute exacerbation (HCC)   Acute respiratory failure with hypoxia and hypercapnia (HCC)   Uncontrolled hypertension   Plan   1. Asymptomatic bradycardia - short NSVT run this am, lytes pending. Suspect this may be the issue since he was aggressively diuresed overnight. LVEF low normal, not bad given ongoing cocaine use. Trops essentially negative. BP poorly controlled - doubt he will be compliant, but would add amlodipine 5 mg daily today. Possible d/c, however, he says he isn't ready.  Time Spent Directly with Patient:  I have spent a total of 25 minutes with the patient reviewing Bennett notes, telemetry, EKGs, labs and examining the patient as well as establishing an assessment and plan that was discussed personally with the patient.  > 50% of time was spent in direct patient care.  Length of Stay:  LOS: 1 day   Pixie Casino, MD, [facility], [place] Director of the Advanced Lipid Disorders &  Cardiovascular Risk Reduction Clinic Diplomate of the American Board of Clinical Lipidology Attending Cardiologist  Direct Dial: [phone]  Fax: [phone]  Website:  [email or web address]  Nadean Corwin Glendora Clouatre 10/21/2019, 9:22 AM

## 2019-10-22 ENCOUNTER — Telehealth: Payer: Self-pay

## 2019-10-22 NOTE — Telephone Encounter (Signed)
Transition Care Management Follow-up Telephone Call Date of discharge and from where: 10/21/2019, [facility]   Call placed to # [phone], message left with call back requested to this CM  Call placed to # [phone], message states that the call is not able to be completed at this time.  Patient has an appointment at [facility] 11/04/2019

## 2019-10-23 ENCOUNTER — Telehealth: Payer: Self-pay

## 2019-10-23 NOTE — Telephone Encounter (Signed)
Transition Care Management Follow-up Telephone Call Attempt # 2 Date of discharge and from where: 10/21/2019, [facility]   Call placed to # [phone], the person who answered said that this was a wrong number, his name is not Ringo.  Phone number removed from [place]  Call placed to # [phone], message states that the number is not in service  Patient has an appointment at [facility] 11/04/2019

## 2019-10-31 DIAGNOSIS — Z03818 Encounter for observation for suspected exposure to other biological agents ruled out: Secondary | ICD-10-CM | POA: Diagnosis not present

## 2019-11-04 ENCOUNTER — Inpatient Hospital Stay (INDEPENDENT_AMBULATORY_CARE_PROVIDER_SITE_OTHER): Payer: Medicaid Other | Admitting: Primary Care

## 2019-11-14 DIAGNOSIS — Z03818 Encounter for observation for suspected exposure to other biological agents ruled out: Secondary | ICD-10-CM | POA: Diagnosis not present

## 2019-11-27 DIAGNOSIS — Z03818 Encounter for observation for suspected exposure to other biological agents ruled out: Secondary | ICD-10-CM | POA: Diagnosis not present

## 2019-11-30 ENCOUNTER — Other Ambulatory Visit: Payer: Self-pay

## 2019-11-30 ENCOUNTER — Inpatient Hospital Stay (HOSPITAL_COMMUNITY)
Admission: EM | Admit: 2019-11-30 | Discharge: 2019-12-02 | DRG: 190 | Disposition: A | Discharge status: Home / Self Care | Payer: Medicaid Other | Attending: Internal Medicine | Admitting: Internal Medicine

## 2019-11-30 ENCOUNTER — Encounter (HOSPITAL_COMMUNITY): Payer: Self-pay | Admitting: Emergency Medicine

## 2019-11-30 ENCOUNTER — Emergency Department (HOSPITAL_COMMUNITY): Payer: Medicaid Other

## 2019-11-30 DIAGNOSIS — F1410 Cocaine abuse, uncomplicated: Secondary | ICD-10-CM | POA: Diagnosis present

## 2019-11-30 DIAGNOSIS — G8929 Other chronic pain: Secondary | ICD-10-CM | POA: Diagnosis present

## 2019-11-30 DIAGNOSIS — I110 Hypertensive heart disease with heart failure: Secondary | ICD-10-CM | POA: Diagnosis not present

## 2019-11-30 DIAGNOSIS — J441 Chronic obstructive pulmonary disease with (acute) exacerbation: Secondary | ICD-10-CM | POA: Diagnosis not present

## 2019-11-30 DIAGNOSIS — R42 Dizziness and giddiness: Secondary | ICD-10-CM | POA: Diagnosis not present

## 2019-11-30 DIAGNOSIS — Z20822 Contact with and (suspected) exposure to covid-19: Secondary | ICD-10-CM | POA: Diagnosis present

## 2019-11-30 DIAGNOSIS — R0602 Shortness of breath: Secondary | ICD-10-CM | POA: Diagnosis not present

## 2019-11-30 DIAGNOSIS — E876 Hypokalemia: Secondary | ICD-10-CM | POA: Diagnosis present

## 2019-11-30 DIAGNOSIS — T50916A Underdosing of multiple unspecified drugs, medicaments and biological substances, initial encounter: Secondary | ICD-10-CM | POA: Diagnosis present

## 2019-11-30 DIAGNOSIS — Z8249 Family history of ischemic heart disease and other diseases of the circulatory system: Secondary | ICD-10-CM

## 2019-11-30 DIAGNOSIS — Z9114 Patient's other noncompliance with medication regimen: Secondary | ICD-10-CM

## 2019-11-30 DIAGNOSIS — I5033 Acute on chronic diastolic (congestive) heart failure: Secondary | ICD-10-CM | POA: Diagnosis not present

## 2019-11-30 DIAGNOSIS — R001 Bradycardia, unspecified: Secondary | ICD-10-CM | POA: Diagnosis present

## 2019-11-30 DIAGNOSIS — I5031 Acute diastolic (congestive) heart failure: Secondary | ICD-10-CM | POA: Diagnosis not present

## 2019-11-30 DIAGNOSIS — Z91120 Patient's intentional underdosing of medication regimen due to financial hardship: Secondary | ICD-10-CM

## 2019-11-30 DIAGNOSIS — F17210 Nicotine dependence, cigarettes, uncomplicated: Secondary | ICD-10-CM | POA: Diagnosis present

## 2019-11-30 DIAGNOSIS — R069 Unspecified abnormalities of breathing: Secondary | ICD-10-CM | POA: Diagnosis not present

## 2019-11-30 DIAGNOSIS — I472 Ventricular tachycardia: Secondary | ICD-10-CM | POA: Diagnosis present

## 2019-11-30 DIAGNOSIS — B1920 Unspecified viral hepatitis C without hepatic coma: Secondary | ICD-10-CM | POA: Diagnosis present

## 2019-11-30 DIAGNOSIS — Z880 Allergy status to penicillin: Secondary | ICD-10-CM

## 2019-11-30 DIAGNOSIS — M545 Low back pain: Secondary | ICD-10-CM | POA: Diagnosis present

## 2019-11-30 DIAGNOSIS — Z79899 Other long term (current) drug therapy: Secondary | ICD-10-CM

## 2019-11-30 DIAGNOSIS — Z590 Homelessness: Secondary | ICD-10-CM

## 2019-11-30 DIAGNOSIS — I10 Essential (primary) hypertension: Secondary | ICD-10-CM | POA: Diagnosis not present

## 2019-11-30 DIAGNOSIS — Y92009 Unspecified place in unspecified non-institutional (private) residence as the place of occurrence of the external cause: Secondary | ICD-10-CM

## 2019-11-30 LAB — CBC WITH DIFFERENTIAL/PLATELET
Abs Immature Granulocytes: 0.04 10*3/uL (ref 0.00–0.07)
Basophils Absolute: 0 10*3/uL (ref 0.0–0.1)
Basophils Relative: 0 %
Eosinophils Absolute: 0.1 10*3/uL (ref 0.0–0.5)
Eosinophils Relative: 2 %
HCT: 46.2 % (ref 39.0–52.0)
Hemoglobin: 15.2 g/dL (ref 13.0–17.0)
Immature Granulocytes: 1 %
Lymphocytes Relative: 29 %
Lymphs Abs: 1.6 10*3/uL (ref 0.7–4.0)
MCH: 35.1 pg — ABNORMAL HIGH (ref 26.0–34.0)
MCHC: 32.9 g/dL (ref 30.0–36.0)
MCV: 106.7 fL — ABNORMAL HIGH (ref 80.0–100.0)
Monocytes Absolute: 0.5 10*3/uL (ref 0.1–1.0)
Monocytes Relative: 8 %
Neutro Abs: 3.2 10*3/uL (ref 1.7–7.7)
Neutrophils Relative %: 60 %
Platelets: 152 10*3/uL (ref 150–400)
RBC: 4.33 MIL/uL (ref 4.22–5.81)
RDW: 13.9 % (ref 11.5–15.5)
WBC: 5.4 10*3/uL (ref 4.0–10.5)
nRBC: 0 % (ref 0.0–0.2)

## 2019-11-30 LAB — BASIC METABOLIC PANEL
Anion gap: 9 (ref 5–15)
BUN: 11 mg/dL (ref 6–20)
CO2: 29 mmol/L (ref 22–32)
Calcium: 8.6 mg/dL — ABNORMAL LOW (ref 8.9–10.3)
Chloride: 101 mmol/L (ref 98–111)
Creatinine, Ser: 0.74 mg/dL (ref 0.61–1.24)
GFR calc Af Amer: >60 mL/min (ref >60)
GFR calc non Af Amer: >60 mL/min (ref >60)
Glucose, Bld: 135 mg/dL — ABNORMAL HIGH (ref 70–99)
Potassium: 3.4 mmol/L — ABNORMAL LOW (ref 3.5–5.1)
Sodium: 139 mmol/L (ref 135–145)

## 2019-11-30 LAB — HEPATIC FUNCTION PANEL
ALT: 98 U/L — ABNORMAL HIGH (ref 0–44)
AST: 61 U/L — ABNORMAL HIGH (ref 15–41)
Albumin: 3.4 g/dL — ABNORMAL LOW (ref 3.5–5.0)
Alkaline Phosphatase: 70 U/L (ref 38–126)
Bilirubin, Direct: 0.2 mg/dL (ref 0.0–0.2)
Indirect Bilirubin: 0.8 mg/dL (ref 0.3–0.9)
Total Bilirubin: 1 mg/dL (ref 0.3–1.2)
Total Protein: 7.1 g/dL (ref 6.5–8.1)

## 2019-11-30 LAB — SARS CORONAVIRUS 2 BY RT PCR (HOSPITAL ORDER, PERFORMED IN ~~LOC~~ HOSPITAL LAB): SARS Coronavirus 2: NEGATIVE

## 2019-11-30 LAB — ETHANOL: Alcohol, Ethyl (B): <10 mg/dL (ref <10)

## 2019-11-30 LAB — MAGNESIUM: Magnesium: 1.8 mg/dL (ref 1.7–2.4)

## 2019-11-30 LAB — TROPONIN I (HIGH SENSITIVITY)
Troponin I (High Sensitivity): 50 ng/L — ABNORMAL HIGH (ref <18)
Troponin I (High Sensitivity): 22 ng/L — ABNORMAL HIGH (ref <18)

## 2019-11-30 LAB — BRAIN NATRIURETIC PEPTIDE: B Natriuretic Peptide: 187.4 pg/mL — ABNORMAL HIGH (ref 0.0–100.0)

## 2019-11-30 MED ORDER — AMLODIPINE BESYLATE 5 MG PO TABS
5.0000 mg | ORAL_TABLET | Freq: Every day | ORAL | Status: DC
  Administered 2019-12-01: 5 mg via ORAL
  Filled 2019-11-30: qty 1

## 2019-11-30 MED ORDER — HYDROCHLOROTHIAZIDE 25 MG PO TABS: 12.5000 mg | ORAL_TABLET | Freq: Every day | ORAL | Status: DC

## 2019-11-30 MED ORDER — ALBUTEROL SULFATE (2.5 MG/3ML) 0.083% IN NEBU: 2.5000 mg | INHALATION_SOLUTION | RESPIRATORY_TRACT | Status: DC | PRN

## 2019-11-30 MED ORDER — POTASSIUM CHLORIDE CRYS ER 20 MEQ PO TBCR
40.0000 meq | EXTENDED_RELEASE_TABLET | Freq: Two times a day (BID) | ORAL | Status: AC
  Administered 2019-11-30 (×2): 40 meq via ORAL
  Filled 2019-11-30 (×2): qty 2

## 2019-11-30 MED ORDER — LISINOPRIL 20 MG PO TABS
20.0000 mg | ORAL_TABLET | Freq: Every day | ORAL | Status: DC
  Administered 2019-12-01 – 2019-12-02 (×2): 20 mg via ORAL
  Filled 2019-11-30 (×2): qty 1

## 2019-11-30 MED ORDER — AEROCHAMBER PLUS FLO-VU LARGE MISC: 1.0000 | Freq: Once | Status: DC

## 2019-11-30 MED ORDER — ADULT MULTIVITAMIN W/MINERALS CH
1.0000 | ORAL_TABLET | Freq: Every day | ORAL | Status: DC
  Administered 2019-12-01 – 2019-12-02 (×2): 1 via ORAL
  Filled 2019-11-30 (×2): qty 1

## 2019-11-30 MED ORDER — PREDNISONE 20 MG PO TABS
40.0000 mg | ORAL_TABLET | Freq: Every day | ORAL | Status: DC
  Administered 2019-12-01 – 2019-12-02 (×2): 40 mg via ORAL
  Filled 2019-11-30 (×2): qty 2

## 2019-11-30 MED ORDER — SODIUM CHLORIDE 0.9% FLUSH: 3.0000 mL | INTRAVENOUS | Status: DC | PRN

## 2019-11-30 MED ORDER — SODIUM CHLORIDE 0.9 % IV SOLN: 250.0000 mL | INTRAVENOUS | Status: DC | PRN

## 2019-11-30 MED ORDER — AMLODIPINE BESYLATE 5 MG PO TABS
5.0000 mg | ORAL_TABLET | Freq: Once | ORAL | Status: AC
  Administered 2019-11-30: 5 mg via ORAL
  Filled 2019-11-30: qty 1

## 2019-11-30 MED ORDER — MAGNESIUM SULFATE 2 GM/50ML IV SOLN
2.0000 g | Freq: Once | INTRAVENOUS | Status: AC
  Administered 2019-11-30: 2 g via INTRAVENOUS
  Filled 2019-11-30: qty 50

## 2019-11-30 MED ORDER — ENOXAPARIN SODIUM 40 MG/0.4ML ~~LOC~~ SOLN
40.0000 mg | SUBCUTANEOUS | Status: DC
  Filled 2019-11-30: qty 0.4

## 2019-11-30 MED ORDER — NICOTINE 21 MG/24HR TD PT24
21.0000 mg | MEDICATED_PATCH | Freq: Every day | TRANSDERMAL | Status: DC
  Filled 2019-11-30 (×2): qty 1

## 2019-11-30 MED ORDER — SODIUM CHLORIDE 0.9% FLUSH
3.0000 mL | Freq: Two times a day (BID) | INTRAVENOUS | Status: DC
  Administered 2019-11-30 – 2019-12-02 (×4): 3 mL via INTRAVENOUS

## 2019-11-30 MED ORDER — IPRATROPIUM BROMIDE 0.02 % IN SOLN: 2.5000 mL | RESPIRATORY_TRACT | Status: DC | PRN

## 2019-11-30 MED ORDER — IPRATROPIUM BROMIDE HFA 17 MCG/ACT IN AERS
2.0000 | INHALATION_SPRAY | Freq: Once | RESPIRATORY_TRACT | Status: AC
  Administered 2019-11-30: 2 via RESPIRATORY_TRACT
  Filled 2019-11-30: qty 12.9

## 2019-11-30 MED ORDER — METHYLPREDNISOLONE SODIUM SUCC 125 MG IJ SOLR
125.0000 mg | Freq: Once | INTRAMUSCULAR | Status: AC
  Administered 2019-11-30: 125 mg via INTRAVENOUS
  Filled 2019-11-30: qty 2

## 2019-11-30 MED ORDER — FUROSEMIDE 10 MG/ML IJ SOLN
40.0000 mg | Freq: Once | INTRAMUSCULAR | Status: AC
  Administered 2019-11-30: 40 mg via INTRAVENOUS
  Filled 2019-11-30: qty 4

## 2019-11-30 MED ORDER — ALBUTEROL SULFATE HFA 108 (90 BASE) MCG/ACT IN AERS
4.0000 | INHALATION_SPRAY | Freq: Once | RESPIRATORY_TRACT | Status: AC
  Administered 2019-11-30: 4 via RESPIRATORY_TRACT
  Filled 2019-11-30: qty 6.7

## 2019-11-30 NOTE — H&P (Addendum)
Date: 11/30/2019               Patient Name:  [name] MRN: [id number]  DOB: 04-Jul-1965 Age / Sex: 54 y.o., male   PCP: Clent Demark, PA-C         Medical Service: Internal Medicine Teaching Service         Attending Physician: Dr. Lucious Groves, DO    First Contact: Dr. Fatima Sanger Pager: [phone]  Second Contact: Dr. Lonia Skinner Pager: [phone]       After Hours (After 5p/  First Contact Pager: [phone]  weekends / holidays): Second Contact Pager: [phone]   Chief Complaint: Couldn't breath and was coughing real bad  History of Present Illness: Mr. Sidor is a 54 yr old male with a past medical history of hypertension, HFpEF, COPD, hx of hepatitis C, tobacco abuse, polysubstance abuse who presents today because of difficulty breathing. He reports that he was awoken from sleep this AM with SOB and has been having fits of coughing that has been producing white phlegm. His cough has increased in frequency lately. He has been using his inhalers daily but reports minimal improvement in wheezing/SOB with these. He endorses subjective fevers, generalized weakness since onset of symptoms. He reports his breathing has been getting worse over the last 4-5 months overall though. Mr. Eugene notes he continues to smoke cigarettes but has decreased quantity to 1/2 ppd.   Mr. Champine states he has not taken any of his BP medications lately due to issues with cost and transportation. He denies any chest pain at this time.   ROS: Positive- Coughing, phlegm, dizziness, fever (subjective), generalized weakness, weight gain Negative: chest pain/tightness, nausea, vomiting  Meds:  Current Meds  Medication Sig  . albuterol (VENTOLIN HFA) 108 (90 Base) MCG/ACT inhaler Inhale 2 puffs into the lungs every 4 (four) hours as needed for wheezing or shortness of breath (cough).  . hydrochlorothiazide (HYDRODIURIL) 12.5 MG tablet Take 1 tablet (12.5 mg total) by mouth daily.  [name]  lisinopril (ZESTRIL) 20 MG tablet Take 1 tablet (20 mg total) by mouth daily.  [name] tiotropium (SPIRIVA HANDIHALER) 18 MCG inhalation capsule Place 1 capsule (18 mcg total) into inhaler and inhale daily.   Allergies: Allergies as of 11/30/2019 - Review Complete 11/30/2019  Allergen Reaction Noted  . Penicillins  11/12/2011   Past Medical History:  Diagnosis Date  . CHF (congestive heart failure) ([place])   . Chronic bronchitis ([place])   . Chronic lower back pain   . COPD (chronic obstructive pulmonary disease) ([place])   . Diskitis   . Hypertension   . Panic attacks   . Tobacco abuse     Family History: Father: deceased 30 lymph node cancer Mother: in late 70's/early 79's- heart attack, has pacemaker Older brother- heart attack 3 younger brothers- healthy Daughter- 49 healthy Son- 81 healthy  Social History: smokes 1/2 ppd, drinks 2-3 beers per night, frequent crack cocaine use (last time was 7/11) Lives in hotel room by himself  Review of Systems: A complete ROS was negative except as per HPI.   Physical Exam: Blood pressure (!) 165/88, pulse 66, temperature 97.8 F (36.6 C), temperature source Oral, resp. rate (!) 22, weight 97 kg, SpO2 94 %.  Physical Exam Vitals and nursing note reviewed.  Constitutional:      General: He is not in acute distress.    Appearance: He is not ill-appearing, toxic-appearing or diaphoretic.  HENT:  Head: Normocephalic and atraumatic.  Eyes:     Extraocular Movements: Extraocular movements intact.  Cardiovascular:     Rate and Rhythm: Normal rate and regular rhythm.     Comments: With movement HR would jump from the 60's into the 110's but every QRS had a P wave.  Pulmonary:     Effort: Tachypnea present. No accessory muscle usage or respiratory distress.     Breath sounds: Examination of the right-lower field reveals wheezing and rales. Examination of the left-lower field reveals decreased breath sounds, wheezing and rales. Decreased breath  sounds, wheezing and rales present.  Abdominal:     General: There is distension.     Palpations: There is no hepatomegaly or mass.     Tenderness: There is no abdominal tenderness.     Comments: Tympanic on percussion  Musculoskeletal:     Right lower leg: Edema present.     Left lower leg: Edema present.  Skin:    General: Skin is warm and dry.  Neurological:     General: No focal deficit present.     Mental Status: He is oriented to person, place, and time.  Psychiatric:        Mood and Affect: Mood normal.        Behavior: Behavior normal.    EKG: personally reviewed my interpretation is Sinus Bradycardia  CXR: personally reviewed my interpretation is that the heart is borderline enlarged but stable. There is moderate tortuosity and calcification of the thoracic aorta. Peribronchial thickening and increased interstitial markings suggesting interstitial edema. No pleural effusions or focal infiltrates. No pulmonary lesions.  Assessment & Plan by Problem: Active Problems:   Sinus bradycardia   COPD with acute exacerbation (HCC)   Acute heart failure with preserved ejection fraction (HFpEF) [facility])  Mr. Dimmick is a 54 year old male with a past medical history of COPD, HFpEF, hypertension, hx of hepatitis C who is presenting to the ED on 7/12 for acute dyspnea and is currently admitted for acute exacerbation of both COPD and heart failure.   # Mild-Moderate Exacerbation of COPD  S/p Solumedrol, albuterol and Atrovent in the ED. Initially on 2L of oxygen for comfort but weaned off.   - Albuterol and Atrovent PRN q4h for wheezing/SOB - Prednisone 40 mg daily x 5 days  - Recommend outpatient PFTs   # HFpEF  Likely in the setting of uncontrolled HTN. BNP only mildly elevated by pitting edema with bibasilar crackles noted on examination and mild edema on CXR. One time dose of Lasix added by Cardiology; so far, he's had good urine output at 2.5 L since admission  - Daily weights -  Strict in/out - Low sodium diet  # Hypertension, Uncontrolled  Due to cost and transportation issues. No end organ damage noted on lab work or examination today.   - Restarted Amlodipine, Lisinopril - Holding HCTZ in light of new loop diuretic - TOC consult for social resources   # Sinus Bradycardia  Previously evaluated by Cardiology and felt to be secondary to chronic hypoxia. Not considered a candidate for pacemaker. No ischemic changes on EKG today.  - Avoid beta blockers   # NSVT  9 beat run of NSVT; asymptomatic. Cardiology consulted and recommends electrolyte replacement. Unable to started beta blockers. Will continue CCB.   - Telemetry overnight   # Abnormal LFTs # Hx of Hepatitis C Mild transaminase elevation with improvement since last admission. Abdominal bloating on exam concerning for ascites but stigmata of cirrhosis noted. Will  need further work up outpatient.   - Repeat HCV quant with genotyping reflex   Dispo: Admit patient to Observation with expected length of stay less than 2 midnights.  Signed: Jose Persia, MD 11/30/2019, 7:40 PM  Pager: [phone] After 5pm on weekdays and 1pm on weekends: On Call pager: [phone]
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
## 2019-11-30 NOTE — ED Notes (Signed)
Pt going to room 10.  Given coke and gram crackers per his request

## 2019-11-30 NOTE — ED Triage Notes (Signed)
Pt in via [facility] w/sob that woke him up this am. States it began getting worse yesterday, does have CHF and COPD. sats 94% on RA per EMS, [facility] for comfort. En route, EMS stated pt had periods when he got dizzy, sleepy and brady into 40's. Denies any cp, does have HA. No BP meds x 1 wk

## 2019-11-30 NOTE — ED Notes (Signed)
Pt called asking to be unhooked from monitor b/c he was going to the bathroom. RN offered bedside commode, he refused stating "I am going."  Pt was unhooked and RN walked w/ him.  He did have a steady gait.  He is coughing often.

## 2019-11-30 NOTE — ED Notes (Signed)
Pt ambulated about 50 ft with this RN. sats remained 94-97% on RA, but pt became very winded and started coughing when he got back to room

## 2019-12-01 DIAGNOSIS — I5031 Acute diastolic (congestive) heart failure: Secondary | ICD-10-CM | POA: Diagnosis not present

## 2019-12-01 DIAGNOSIS — B1920 Unspecified viral hepatitis C without hepatic coma: Secondary | ICD-10-CM | POA: Diagnosis present

## 2019-12-01 DIAGNOSIS — Z590 Homelessness: Secondary | ICD-10-CM | POA: Diagnosis not present

## 2019-12-01 DIAGNOSIS — J441 Chronic obstructive pulmonary disease with (acute) exacerbation: Secondary | ICD-10-CM | POA: Diagnosis not present

## 2019-12-01 DIAGNOSIS — I472 Ventricular tachycardia: Secondary | ICD-10-CM | POA: Diagnosis present

## 2019-12-01 DIAGNOSIS — F1410 Cocaine abuse, uncomplicated: Secondary | ICD-10-CM | POA: Diagnosis present

## 2019-12-01 DIAGNOSIS — I110 Hypertensive heart disease with heart failure: Secondary | ICD-10-CM | POA: Diagnosis not present

## 2019-12-01 DIAGNOSIS — R0602 Shortness of breath: Secondary | ICD-10-CM | POA: Diagnosis not present

## 2019-12-01 DIAGNOSIS — Z8249 Family history of ischemic heart disease and other diseases of the circulatory system: Secondary | ICD-10-CM | POA: Diagnosis not present

## 2019-12-01 DIAGNOSIS — E876 Hypokalemia: Secondary | ICD-10-CM | POA: Diagnosis present

## 2019-12-01 DIAGNOSIS — Z880 Allergy status to penicillin: Secondary | ICD-10-CM | POA: Diagnosis not present

## 2019-12-01 DIAGNOSIS — Y92009 Unspecified place in unspecified non-institutional (private) residence as the place of occurrence of the external cause: Secondary | ICD-10-CM | POA: Diagnosis not present

## 2019-12-01 DIAGNOSIS — Z91120 Patient's intentional underdosing of medication regimen due to financial hardship: Secondary | ICD-10-CM | POA: Diagnosis not present

## 2019-12-01 DIAGNOSIS — T50916A Underdosing of multiple unspecified drugs, medicaments and biological substances, initial encounter: Secondary | ICD-10-CM | POA: Diagnosis present

## 2019-12-01 DIAGNOSIS — M545 Low back pain: Secondary | ICD-10-CM | POA: Diagnosis present

## 2019-12-01 DIAGNOSIS — Z79899 Other long term (current) drug therapy: Secondary | ICD-10-CM | POA: Diagnosis not present

## 2019-12-01 DIAGNOSIS — G8929 Other chronic pain: Secondary | ICD-10-CM | POA: Diagnosis present

## 2019-12-01 DIAGNOSIS — Z9114 Patient's other noncompliance with medication regimen: Secondary | ICD-10-CM | POA: Diagnosis not present

## 2019-12-01 DIAGNOSIS — F17210 Nicotine dependence, cigarettes, uncomplicated: Secondary | ICD-10-CM | POA: Diagnosis present

## 2019-12-01 DIAGNOSIS — Z20822 Contact with and (suspected) exposure to covid-19: Secondary | ICD-10-CM | POA: Diagnosis not present

## 2019-12-01 DIAGNOSIS — I5033 Acute on chronic diastolic (congestive) heart failure: Secondary | ICD-10-CM | POA: Diagnosis not present

## 2019-12-01 LAB — COMPREHENSIVE METABOLIC PANEL
ALT: 92 U/L — ABNORMAL HIGH (ref 0–44)
AST: 52 U/L — ABNORMAL HIGH (ref 15–41)
Albumin: 3.3 g/dL — ABNORMAL LOW (ref 3.5–5.0)
Alkaline Phosphatase: 75 U/L (ref 38–126)
Anion gap: 9 (ref 5–15)
BUN: 17 mg/dL (ref 6–20)
CO2: 27 mmol/L (ref 22–32)
Calcium: 8.8 mg/dL — ABNORMAL LOW (ref 8.9–10.3)
Chloride: 98 mmol/L (ref 98–111)
Creatinine, Ser: 0.84 mg/dL (ref 0.61–1.24)
GFR calc Af Amer: >60 mL/min (ref >60)
GFR calc non Af Amer: >60 mL/min (ref >60)
Glucose, Bld: 205 mg/dL — ABNORMAL HIGH (ref 70–99)
Potassium: 3.5 mmol/L (ref 3.5–5.1)
Sodium: 134 mmol/L — ABNORMAL LOW (ref 135–145)
Total Bilirubin: 1.3 mg/dL — ABNORMAL HIGH (ref 0.3–1.2)
Total Protein: 7.1 g/dL (ref 6.5–8.1)

## 2019-12-01 LAB — CBC
HCT: 47.9 % (ref 39.0–52.0)
Hemoglobin: 16.1 g/dL (ref 13.0–17.0)
MCH: 35 pg — ABNORMAL HIGH (ref 26.0–34.0)
MCHC: 33.6 g/dL (ref 30.0–36.0)
MCV: 104.1 fL — ABNORMAL HIGH (ref 80.0–100.0)
Platelets: 171 10*3/uL (ref 150–400)
RBC: 4.6 MIL/uL (ref 4.22–5.81)
RDW: 13.8 % (ref 11.5–15.5)
WBC: 10.8 10*3/uL — ABNORMAL HIGH (ref 4.0–10.5)
nRBC: 0 % (ref 0.0–0.2)

## 2019-12-01 LAB — MAGNESIUM: Magnesium: 1.9 mg/dL (ref 1.7–2.4)

## 2019-12-01 LAB — TSH: TSH: 1.043 u[IU]/mL (ref 0.350–4.500)

## 2019-12-01 MED ORDER — AMLODIPINE BESYLATE 10 MG PO TABS
10.0000 mg | ORAL_TABLET | Freq: Every day | ORAL | Status: DC
  Administered 2019-12-02: 10 mg via ORAL
  Filled 2019-12-01: qty 1

## 2019-12-01 MED ORDER — POTASSIUM CHLORIDE CRYS ER 20 MEQ PO TBCR
40.0000 meq | EXTENDED_RELEASE_TABLET | Freq: Once | ORAL | Status: AC
  Administered 2019-12-01: 40 meq via ORAL

## 2019-12-01 MED ORDER — AMLODIPINE BESYLATE 5 MG PO TABS
5.0000 mg | ORAL_TABLET | Freq: Once | ORAL | Status: AC
  Administered 2019-12-01: 5 mg via ORAL
  Filled 2019-12-01: qty 1

## 2019-12-01 MED ORDER — HYDROCHLOROTHIAZIDE 12.5 MG PO CAPS
12.5000 mg | ORAL_CAPSULE | Freq: Every day | ORAL | Status: DC
  Filled 2019-12-01: qty 1

## 2019-12-01 NOTE — Progress Notes (Addendum)
Progress Note  Patient Name: [name] Date of Encounter: 12/01/2019  Primary Cardiologist: Ena Dawley, MD  Subjective   Sleeping when I walked in. Easily arousable. Reports he wants to quit cocaine. States he doesn't think anyone can help him get his medicines. Denies CP. Breathing feeling a little better, just tired.  Inpatient Medications    Scheduled Meds: . AeroChamber Plus Flo-Vu Large  1 each Other Once  . amLODipine  5 mg Oral Daily  . enoxaparin (LOVENOX) injection  40 mg Subcutaneous Q24H  . hydrochlorothiazide  12.5 mg Oral Daily  . lisinopril  20 mg Oral Daily  . multivitamin with minerals  1 tablet Oral Daily  . nicotine  21 mg Transdermal Daily  . predniSONE  40 mg Oral Q breakfast  . sodium chloride flush  3 mL Intravenous Q12H   Continuous Infusions: . sodium chloride     PRN Meds: sodium chloride, albuterol, ipratropium, sodium chloride flush   Vital Signs    Vitals:   11/30/19 2027 11/30/19 2042 12/01/19 0535 12/01/19 0622  BP:  136/70 (!) 190/120 (!) 176/124  Pulse:  (!) 57 80 (!) 51  Resp:  16 16   Temp: 98.2 F (36.8 C)  98.1 F (36.7 C)   TempSrc: Oral  Oral   SpO2:  94% 97%   Weight:  97 kg      Intake/Output Summary (Last 24 hours) at 12/01/2019 0836 Last data filed at 12/01/2019 0328 Gross per 24 hour  Intake 240 ml  Output 2450 ml  Net -2210 ml   Last 3 Weights 11/30/2019 11/30/2019 10/21/2019  Weight (lbs) 213 lb 13.5 oz 213 lb 13.5 oz 213 lb 12.8 oz  Weight (kg) 97 kg 97 kg 96.979 kg  Some encounter information is confidential and restricted. Go to Review Flowsheets activity to see all data.     Telemetry    NSR as well as wandering atrial pacemaker with episodic transient bradycardia while sleeping, nonsustained. This is mostly in the form of WAP, but did have 2 very brief episodes of junctional bradycardia while sleeping with HR upper 30s, asymptomatic (1:51 and 4:05am) - Personally Reviewed  Physical Exam    GEN: No acute distress, obese, ruddy complexion HEENT: Normocephalic, atraumatic, sclera non-icteric. Neck: No JVD or bruits. Cardiac: RRR no murmurs, rubs, or gallops.  Radials/DP/PT 1+ and equal bilaterally.  Respiratory: Clear to auscultation bilaterally. Breathing is unlabored. GI: Soft, distended but nontender, BS +x 4. MS: no deformity. Extremities: No clubbing or cyanosis. No edema. Distal pedal pulses are 2+ and equal bilaterally. Neuro:  AAOx3. Follows commands. Psych:  Responds to questions appropriately with a normal affect.  Labs    High Sensitivity Troponin:   Recent Labs  Lab 11/30/19 0657 11/30/19 1023  TROPONINIHS 50* 22*      Cardiac EnzymesNo results for input(s): TROPONINI in the last 168 hours. No results for input(s): TROPIPOC in the last 168 hours.   Chemistry Recent Labs  Lab 11/30/19 0606 11/30/19 1108 12/01/19 0637  NA 139  --  134*  K 3.4*  --  3.5  CL 101  --  98  CO2 29  --  27  GLUCOSE 135*  --  205*  BUN 11  --  17  CREATININE 0.74  --  0.84  CALCIUM 8.6*  --  8.8*  PROT  --  7.1 7.1  ALBUMIN  --  3.4* 3.3*  AST  --  61* 52*  ALT  --  98* 92*  ALKPHOS  --  70 75  BILITOT  --  1.0 1.3*  GFRNONAA >60  --  >60  GFRAA >60  --  >60  ANIONGAP 9  --  9     Hematology Recent Labs  Lab 11/30/19 0606 12/01/19 0637  WBC 5.4 10.8*  RBC 4.33 4.60  HGB 15.2 16.1  HCT 46.2 47.9  MCV 106.7* 104.1*  MCH 35.1* 35.0*  MCHC 32.9 33.6  RDW 13.9 13.8  PLT 152 171    BNP Recent Labs  Lab 11/30/19 0653  BNP 187.4*     DDimer No results for input(s): DDIMER in the last 168 hours.   Radiology    DG Chest Port 1 View  Result Date: 11/30/2019 CLINICAL DATA:  Shortness of breath. EXAM: PORTABLE CHEST 1 VIEW COMPARISON:  Chest x-ray 10/11/2019 FINDINGS: The heart is borderline enlarged but stable. There is moderate tortuosity and calcification of the thoracic aorta. Peribronchial thickening and increased interstitial markings  suggesting interstitial edema. No pleural effusions or focal infiltrates. No pulmonary lesions. The bony thorax is intact. IMPRESSION: Cardiac enlargement with interstitial edema. Electronically Signed   By: Marijo Sanes M.D.   On: 11/30/2019 06:25    Cardiac Studies   2D echo 10/20/19 1. Left ventricular ejection fraction, by estimation, is 50 to 55%. The  left ventricle has low normal function. The left ventricle has no regional  wall motion abnormalities. There is moderate left ventricular hypertrophy.  Left ventricular diastolic  parameters are indeterminate.  2. Right ventricular systolic function was not well visualized. The right  ventricular size is not well visualized. Tricuspid regurgitation signal is  inadequate for assessing PA pressure.  3. Left atrial size was mild to moderately dilated.  4. The mitral valve is grossly normal. Trivial mitral valve  regurgitation. No evidence of mitral stenosis.  5. The aortic valve has an indeterminant number of cusps. Aortic valve  regurgitation is not visualized. Mild aortic valve sclerosis is present,  with no evidence of aortic valve stenosis.  6. The inferior vena cava is normal in size with <50% respiratory  variability, suggesting right atrial pressure of 8 mmHg.   Conclusion(s)/Recommendation(s): Technically difficult study. Grossly  normal LV function without focal wall motion abnormalities.   Patient Profile     54 y.o. male with a history of normal coronaries on cardiac cath in 09/2015, non-sustained VT, COPD, hypertension, chronic low back pain, polysubstance abuse (tobacco, cocaine), prior fungemia treated in 2018, and history of non-compliance who presented to the hospital for further evaluation of shortness of breath. Per triage note, en route EMS reported periods when patient got dizzy, sleepy, and bradycardic with rates in the 40's. He was markedly hypertensive on arrival, as well as somnolent. He also reported  productive yellow cough for several weeks. He finished course of Prednisone and Azithromycin after most recent discharge but has not been taking BP medications as he cannot afford them. He reported ongoing cocaine and tobacco use. Cardiology consulted for short runs of NSVT on telemetry. Poor social situation noted - currently stays at the [facility]. 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   Assessment & Plan    1. Dyspnea likely due to COPD Exacerbation - Patient presented with worsening shortness of breath, possible component of interstitial edema related to accelerated HTN. BNP was marginally elevated at 187. Recent echo on 10/20/2019 showed LVEF of 50-55% with normal wall motion and  moderate LVH - Received a dose of IV Lasix, appears euvolemic so no further dosing indicated today - Rx of COPD per IM - He has suspected OSA given lethargy and bradycardia while sleeping, but getting sleep study on this patient as outpatient seems unrealistic given social concerns - can revisit down the road as situation evolves  2. Sinus Bradycardia - Prior history of such, previously been felt to be related to underlying hypoxemia and vagal stimulation associated with COPD. Telemetry shows NSR/SB (HR 50s-60s) as well as wandering atrial pacemaker with episodic transient bradycardia while sleeping, nonsustained. This is mostly in the form of WAP, but did have 2 very brief episodes of junctional bradycardia while sleeping with HR upper 30s, asymptomatic (1:51 and 4:05am) - Since asymptomatic, no specific treatment needed at this time aside from avoidance of AVN blocking agents - Check thyroid - Continue to monitor on telemetry  3. Non-Sustained VT - short runs of NSVT noted on telemetry in ED. Longest run being about 9 beats. Quiescent on telemetry now. - See #5 for lyte management - Avoid beta blocker due to cocaine - Avoid substance abuse  4. Hypertension, poorly controlled  in context of noncompliance - Blood pressure remains elevated at times, but variable (130s-190s). He was restarted on amlodipine 5mg  and lisinopril 20mg  on 7/12.  - He was written to restart HCTZ today but I do not think it makes sense to add a 3rd BP medication before we are maxed out on current doses otherwise (as well as electrolyte abnormalities potentially requiring a 4th chronic medicine, potassium) - Continue amlodipine + lisinopril today and follow response - if still elevated, can titrate either - Add TSH to labs - Needs outpatient primary care follow-up  5. Hypokalemia/relative hypomagnesemia with goal K 4.0 or greater, Mg 2.0 or greater. Potassium 3.4 on presentation, Mg 1.8. - K 3.5 today -> IM continuing to supplement - Add on recheck Mag level and continue to follow, recommend to keep at goal above   6. Polysubstance Abuse (Tobacco and Cocaine) - Counseled on importance of cessation, will likely lead to premature death if continued  7. Social Concerns - Regardless of what takes place with treatment in the hospital, this is only short-term benefit if he returns to prior habits. Needs better social situation as outpatient to truly make the long term difference - Care management consult pending  8. Abnormal LFTs - Per IM  9. Minimally elevated troponin - relatively low/flat, do not suspect ACS - likely demand process - no further invasive workup planned  For questions or updates, please contact [place] Please consult [email or web address] for contact info under Cardiology/STEMI.  Signed, Charlie Pitter, PA-C 12/01/2019, 8:36 AM    I have personally seen and examined this patient. I agree with the assessment and plan as outlined above.  He is feeling better this am. He is felt to have a COPD exacerbation with mild volume overload.  He has diuresed well. Volume status is ok. No further Lasix indicated. I suspect that his uncontrolled BP has contributed to the diastolic CHF.   Continue anti-hypertensive therapy with titration for goal BP He needs a primary care doctor after discharge.  Cardiology will sign off. Please call with questions.   Lauree Chandler 12/01/2019 11:05 AM

## 2019-12-01 NOTE — Progress Notes (Signed)
Subjective: Interviewed patient at bedside. He reports that his breathing is about the same as yesterday. He reports that his coughing and his phlegm production are unchanged. He reports that he has no pain anywhere. Discussed restarting home meds and coordinating with social work to help manage the financial component. Discussed this is more COPD than than CHF exacerbation and informed will proceed with breathing treatments. Voices agreement and states he does not want to leave the hospital too soon like last time. He has no questions at this time  Objective:  Vital signs in last 24 hours: Vitals:   11/30/19 2012 11/30/19 2027 11/30/19 2042 12/01/19 0535  BP:   136/70 (!) 190/120  Pulse: 64  (!) 57 80  Resp: (!) 24  16 16   Temp:  98.2 F (36.8 C)  98.1 F (36.7 C)  TempSrc:  Oral  Oral  SpO2: 94%  94% 97%  Weight:   97 kg    Physical Exam Vitals and nursing note reviewed.  Constitutional:      General: He is not in acute distress.    Appearance: He is well-developed. He is not ill-appearing or toxic-appearing.  HENT:     Head: Normocephalic and atraumatic.  Eyes:     Extraocular Movements: Extraocular movements intact.  Neck:     Comments: JVD difficult to assess Cardiovascular:     Rate and Rhythm: Normal rate and regular rhythm.     Pulses:          Radial pulses are 2+ on the right side and 2+ on the left side.       Dorsalis pedis pulses are 2+ on the right side and 2+ on the left side.     Heart sounds: Normal heart sounds, S1 normal and S2 normal.  Pulmonary:     Effort: No respiratory distress.     Breath sounds: Wheezing present.  Abdominal:     General: There is distension.     Palpations: There is no mass.     Tenderness: There is no abdominal tenderness.  Musculoskeletal:        General: No tenderness.     Right lower leg: 1+ Edema present.     Left lower leg: 1+ Edema present.  Neurological:     General: No focal deficit present.     Mental Status: He  is alert. Mental status is at baseline.  Psychiatric:        Mood and Affect: Mood normal.        Behavior: Behavior normal.        Thought Content: Thought content normal.      Assessment/Plan: Mr. Rozario is a 54 year old male with a PMHx of COPD, HFpEF, hypertension, hepatitis C who is presenting to the ED on 7/12 for acute dyspnea and is currently admitted for acute exacerbation of both COPD and heart failure.   Active Problems:   Sinus bradycardia   COPD with acute exacerbation (HCC)   Acute heart failure with preserved ejection fraction (HFpEF) (HCC)   Mild-Moderate Exacerbation of COPD  S/p Solumedrol, albuterol and Atrovent in the ED. Initially on 2L of oxygen for comfort but weaned off.  - Continue Albuterol and Atrovent PRN q4h for wheezing/SOB - Continue Prednisone 40 mg daily x 4 days  - Recommend outpatient PFTs    HFpEF  Likely in the setting of uncontrolled HTN. BNP only mildly elevated 187.4 has pitting edema with bibasilar crackles noted on examination and mild edema on CXR. One  time dose of Lasix added by Cardiology; produced good urine output at 2210cc. Cardiology feels that current episode is due to COPD exacerbation. - Daily weights - Strict in/out - Low sodium diet   Hypertension, Uncontrolled  Not taking medications due to cost and transportation issues. No end organ damage noted on lab work or examination today.  -Increased Amlodipine 10mg  daily and Lisinopril 20mg  daily -Consult Social Work for social resources    Sinus Bradycardia:  Previously evaluated by Cardiology and felt to be secondary to chronic hypoxia. Not considered a candidate for pacemaker. No ischemic changes on EKG yesterday. -continue to monitor -Avoid beta blockers    NSVT:  9 beat run of NSVT; asymptomatic. Cardiology consulted and recommends electrolyte replacement. Unable to started beta blockers. Will continue CCB.  - Telemetry overnight    Abnormal LFT's with Hx of  Hepatitis C: Mild transaminase elevation with improvement since last admission. Abdominal bloating on exam concerning for ascites but stigmata of cirrhosis noted. Will need further work up outpatient.  -Pending HCV quant with genotyping reflex    Prior to Admission Living Arrangement: hotel Anticipated Discharge Location: hotel Barriers to Discharge: finacial Dispo: Anticipated discharge in approximately 1-2 day(s).   Briant Cedar, MD 12/01/2019, 6:05 AM Pager: [phone] After 5pm on weekdays and 1pm on weekends: On Call pager [phone]

## 2019-12-01 NOTE — Progress Notes (Signed)
MD made aware of BP 190/120(140), rechecked now 176/124(135) and also that tele reported pts HR dropped as low as 31 during the night but didn't sustain.  Will cont to monitor.

## 2019-12-01 NOTE — TOC Initial Note (Addendum)
Transition of Care [facility]) - Initial/Assessment Note    Patient Details  Name: [name] MRN: [id number] Date of Birth: [birth year]  [facility] CM/SW Contact:    Marilu Favre, RN Phone Number: 12/01/2019, 4:26 PM  Clinical Narrative:                 Consult for PCP. Patient has medicaid all medicaid patients are assigned PCP. Review chart patient goes to [facility]. Called and spoke to [place] with CHW and confirmed. They have been unable to get in touch with patient.   Spoke to patient at bedside. He does want to continue with [place]. Appointment scheduled.   Address in Epic is patient's mother's address, he gets his mail there. His number is [phone] updated in system.   Confirmed he has Medicaid and co pays are $3. NCM cannot assist with co pays. Changed pharmacy to [facility]. Post discharge [facility] and wellness will assist with medications.   Patient lives at [address] [place], [place] [address]  Patient can call medicaid and apply for medicaid transportation. Patient voiced understanding  Expected Discharge Plan: Home/Self Care Barriers to Discharge: Continued Medical Work up   Patient Goals and CMS Choice Patient states their goals for this hospitalization and ongoing recovery are:: to return to his hotel CMS Medicare.gov Compare Post Acute Care list provided to:: Patient    Expected Discharge Plan and Services Expected Discharge Plan: Home/Self Care   Discharge Planning Services: CM Consult, Follow-up appt scheduled   Living arrangements for the past 2 months: Hotel/Motel                 DME Arranged: N/A         HH Arranged: NA          Prior Living Arrangements/Services Living arrangements for the past 2 months: Hotel/Motel Lives with:: Self Patient language and need for interpreter reviewed:: Yes Do you feel safe going back to the place where you live?:  Yes      Need for Family Participation in Patient Care: No (Comment) Care giver support system in place?: Yes (comment)   Criminal Activity/Legal Involvement Pertinent to Current Situation/Hospitalization: No - Comment as needed  Activities of Daily Living Home Assistive Devices/Equipment: None ADL Screening (condition at time of admission) Patient's cognitive ability adequate to safely complete daily activities?: Yes Is the patient deaf or have difficulty hearing?: No Does the patient have difficulty seeing, even when wearing glasses/contacts?: No Does the patient have difficulty concentrating, remembering, or making decisions?: No Patient able to express need for assistance with ADLs?: Yes Does the patient have difficulty dressing or bathing?: No Independently performs ADLs?: Yes (appropriate for developmental age) Does the patient have difficulty walking or climbing stairs?: No Weakness of Legs: Both Weakness of Arms/Hands: None  Permission Sought/Granted   Permission granted to share information with : No              Emotional Assessment Appearance:: Appears stated age Attitude/Demeanor/Rapport: Engaged Affect (typically observed): Accepting Orientation: : Oriented to Self, Oriented to Place, Oriented to  Time, Oriented to Situation Alcohol / Substance Use: Not Applicable Psych Involvement: No (comment)  Admission diagnosis:  COPD exacerbation (HCC) [J44.1] Acute on chronic diastolic congestive heart failure (HCC) [I50.33] COPD with acute exacerbation ([place]) [J44.1] Patient Active Problem List   Diagnosis Date Noted  . Acute heart failure with preserved ejection fraction (HFpEF) ([place]) 11/30/2019  .  Acute on chronic diastolic congestive heart failure ([place])   . Uncontrolled hypertension 10/21/2019  . COPD with acute exacerbation ([place]) 10/20/2019  . Acute respiratory failure with hypoxia and hypercapnia (HCC)   . COPD exacerbation ([place]) 10/19/2019  . Sinus bradycardia    . Acute exacerbation of chronic obstructive pulmonary disease (COPD) ([place]) 10/17/2019  . Elevated liver enzymes 06/11/2017  . Abnormal liver function tests 06/10/2017  . Chronic diastolic CHF (congestive heart failure) ([place]) 06/10/2017  . Hypokalemia 06/10/2017  . Hypomagnesemia 06/10/2017  . Vertebral osteomyelitis, chronic ([place]) 05/31/2017  . Marijuana use 05/31/2017  . Osteomyelitis ([place]) 05/24/2017  . Discitis 05/24/2017  . Diskitis 03/05/2017  . Cocaine use   . Alcohol abuse   . Substance abuse ([place])   . Epidural abscess   . Discitis thoracic region 02/26/2017  . Thoracic back pain   . Tobacco abuse   . Essential hypertension   . Fungal endocarditis   . Suicidal ideation   . IVDU (intravenous drug user)   . Chronic bilateral low back pain without sciatica   . 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[place])   . Vertebral osteomyelitis ([place]) 02/16/2017  . Cocaine abuse ([place]) 10/09/2015  . Homeless single person   . COPD (chronic obstructive pulmonary disease) ([place]) 10/08/2015   PCP:  Clent Demark, PA-C Pharmacy:   [facility], [address] [place] [address] Phone: [phone] Fax: [place], [address] [place] [address] Phone: [phone] Fax: [phone]  [facility] [place], [place] - [address] [place] [address] Phone: [phone] Fax: [phone]     Social Determinants of Health (SDOH) Interventions    Readmission Risk Interventions Readmission Risk Prevention Plan 10/21/2019  Transportation Screening Complete  PCP or Specialist Appt within 3-5 Days Complete  HRI or [place] Complete  Social Work Consult for [place] Planning/Counseling Complete  Palliative Care Screening Complete  Medication Review Designer, fashion/clothing) Complete  Some recent data might be hidden

## 2019-12-02 LAB — BASIC METABOLIC PANEL
Anion gap: 12 (ref 5–15)
BUN: 15 mg/dL (ref 6–20)
CO2: 23 mmol/L (ref 22–32)
Calcium: 8.6 mg/dL — ABNORMAL LOW (ref 8.9–10.3)
Chloride: 101 mmol/L (ref 98–111)
Creatinine, Ser: 0.77 mg/dL (ref 0.61–1.24)
GFR calc Af Amer: >60 mL/min (ref >60)
GFR calc non Af Amer: >60 mL/min (ref >60)
Glucose, Bld: 120 mg/dL — ABNORMAL HIGH (ref 70–99)
Potassium: 3.9 mmol/L (ref 3.5–5.1)
Sodium: 136 mmol/L (ref 135–145)

## 2019-12-02 LAB — MAGNESIUM: Magnesium: 1.9 mg/dL (ref 1.7–2.4)

## 2019-12-02 MED ORDER — SPIRIVA HANDIHALER 18 MCG IN CAPS
18.0000 ug | ORAL_CAPSULE | Freq: Every day | RESPIRATORY_TRACT | 0 refills | Status: DC
Start: 2019-12-02 — End: 2019-12-02

## 2019-12-02 MED ORDER — PREDNISONE 20 MG PO TABS: 40.0000 mg | ORAL_TABLET | Freq: Every day | ORAL | 0 refills | Status: DC

## 2019-12-02 MED ORDER — SPIRIVA HANDIHALER 18 MCG IN CAPS
18.0000 ug | ORAL_CAPSULE | Freq: Every day | RESPIRATORY_TRACT | 0 refills | Status: DC
Start: 2019-12-02 — End: 2020-04-05

## 2019-12-02 MED ORDER — AMLODIPINE BESYLATE 10 MG PO TABS: 10.0000 mg | ORAL_TABLET | Freq: Every day | ORAL | 0 refills | Status: DC

## 2019-12-02 MED ORDER — ALBUTEROL SULFATE HFA 108 (90 BASE) MCG/ACT IN AERS
2.0000 | INHALATION_SPRAY | RESPIRATORY_TRACT | 0 refills | Status: DC | PRN
Start: 2019-12-02 — End: 2019-12-02

## 2019-12-02 MED ORDER — ALBUTEROL SULFATE HFA 108 (90 BASE) MCG/ACT IN AERS
2.0000 | INHALATION_SPRAY | RESPIRATORY_TRACT | 0 refills | Status: DC | PRN
Start: 2019-12-02 — End: 2020-04-05

## 2019-12-02 MED FILL — predniSONE 20 MG TABS: 20 | 3 days supply | Qty: 6 | Fill #0

## 2019-12-02 MED FILL — AMLODIPINE BESYLATE 10 MG T: 10 | 30 days supply | Qty: 30 | Fill #0

## 2019-12-02 MED FILL — SPIRIVA 18 MCG CP-HANDIHALE: 18 | 30 days supply | Qty: 30 | Fill #0

## 2019-12-02 NOTE — Progress Notes (Signed)
Patient provided taxi vaucher to take him to hotel. TOC pharmacy delivered medications.

## 2019-12-02 NOTE — TOC Progression Note (Signed)
Transition of Care [facility]) - Progression Note    Patient Details  Name: Aviraj Kentner MRN: [id number] Date of Birth: 1966-03-02  Transition of Care [facility]) CM/SW [place], [place] Phone Number: 12/02/2019, 10:50 AM  Clinical Narrative:    CSW was consulted for transportation needs for pt.  CSW left taxi voucher for pt at nurses station.    Expected Discharge Plan: Home/Self Care Barriers to Discharge: Continued Medical Work up  Expected Discharge Plan and Services Expected Discharge Plan: Home/Self Care   Discharge Planning Services: CM Consult, Follow-up appt scheduled   Living arrangements for the past 2 months: Hotel/Motel Expected Discharge Date: 12/02/19               DME Arranged: N/A         HH Arranged: NA           Social Determinants of Health (SDOH) Interventions    Readmission Risk Interventions Readmission Risk Prevention Plan 10/21/2019  Transportation Screening Complete  PCP or Specialist Appt within 3-5 Days Complete  HRI or [place] Complete  Social Work Consult for [place] Planning/Counseling Complete  Palliative Care Screening Complete  Medication Review Press photographer) Complete  Some recent data might be hidden

## 2019-12-02 NOTE — Progress Notes (Signed)
Subjective: Interviewed patient at bedside. Patient reports his breathing is doing better today. He reports that he has been coughing up a lot of phlegm. We discussed that the social worker looked over things and that he should be able to follow up with the new PCP.  We recommended that he stops smoking and stops drinking EtOH to help preserve his lungs and liver.  We discussed that he seems to be doing good to be discharged today. He reported concern about leaving today and that he was concerned about spitting stuff up today. He reported that he would be concerned about leaving without a ride. We discussed that Social Work would come speak to him and if needed could help arrange a ride for him.  Objective:  Vital signs in last 24 hours: Vitals:   12/01/19 1153 12/01/19 1230 12/01/19 1754 12/02/19 0028  BP: (!) 204/115 (!) 174/82 (!) 153/76 (!) 155/100  Pulse: 61 66 82 73  Resp: 18  18 17   Temp: 98.9 F (37.2 C)  98 F (36.7 C) 98.5 F (36.9 C)  TempSrc: Oral  Oral Oral  SpO2: 97%  98% 96%  Weight:       Physical Exam Vitals and nursing note reviewed.  Constitutional:      General: He is not in acute distress.    Appearance: Normal appearance. He is normal weight. He is not ill-appearing or toxic-appearing.  HENT:     Head: Normocephalic and atraumatic.  Eyes:     Extraocular Movements: Extraocular movements intact.  Cardiovascular:     Rate and Rhythm: Normal rate and regular rhythm.     Pulses: Normal pulses.          Radial pulses are 2+ on the right side and 2+ on the left side.       Dorsalis pedis pulses are 2+ on the right side and 2+ on the left side.     Heart sounds: Normal heart sounds, S1 normal and S2 normal. No murmur heard.   Pulmonary:     Effort: Pulmonary effort is normal. No respiratory distress.     Breath sounds: Wheezing present.  Abdominal:     General: There is distension.     Palpations: There is no mass.     Tenderness: There is no abdominal  tenderness.  Musculoskeletal:     Right lower leg: 1+ Edema present.     Left lower leg: 1+ Edema present.  Neurological:     General: No focal deficit present.     Mental Status: He is alert. Mental status is at baseline.  Psychiatric:        Mood and Affect: Mood normal.        Behavior: Behavior normal.        Thought Content: Thought content normal.      Assessment/Plan: [name] is a 54 year old male with a PMHx of COPD,HFpEF,hypertension,hepatitis C who is presenting to the ED on 7/12 for acute dyspnea and is currently admitted for acute exacerbation ofbothCOPD and heart failure.  Active Problems:   Sinus bradycardia   COPD with acute exacerbation (HCC)   Acute heart failure with preserved ejection fraction (HFpEF) (HCC)  Mild-Moderate Exacerbation of COPD  S/p Solumedrol, albuterol and Atrovent in the ED. Initially on 2L of oxygen for comfort but weaned off. On room air currently, discussed that stopping smoking would help preserve lung function and decrease loss. - Continue Albuterol and Atrovent PRN q4h for wheezing/SOB - Continue Prednisone 40 mg  daily x 3 days  - Recommend outpatient PFTs    HFpEF  Likely in the setting of uncontrolled HTN. BNP only mildly elevated 187.4 has pitting edema with bibasilar crackles noted on examination and mild edema on CXR. Net input yesterday 440cc. Cardiology feels that current episode is due to COPD exacerbation. - Daily weights - Strict in/out - Low sodium diet   Hypertension, Uncontrolled Not taking medications due to cost and transportation issues. No end organ damage noted on lab work or examination today. BP has been better controlled still having increases. Will continue to monitor and if needed adjust medicines further. -Continue Amlodipine 10mg  daily and Lisinopril 20mg  daily -Consult Social Work for social resources    Sinus Bradycardia:  Previously evaluated by Cardiology and felt to be secondary to  chronic hypoxia. Not considered a candidate for pacemaker. No ischemic changes on EKG yesterday. -continue to monitor -Avoid beta blockers    NSVT:  9 beat run of NSVT; asymptomatic. Cardiology consulted and recommends electrolyte replacement. Unable to started beta blockers. Will continue CCB.  - Telemetry overnight    Abnormal LFT's with Hx of Hepatitis C: Mild transaminase elevation with improvement since last admission. Abdominal bloating on exam concerning for ascites but stigmata of cirrhosis noted. Will need further work up outpatient. Discussed with patient that stopping EtOH would be helpful. -Pending HCV quant with genotyping reflex  Social Work: Has been to see patient yesterday. Scheduled follow up with PCP. Set him up for [facility] pharmacy to get prescriptions at reduced price. Will speak with him about arranging a ride from the hospital if needed.   Prior to Admission Living Arrangement: hotel Anticipated Discharge Location: hotel Barriers to Discharge: finacial Dispo: Anticipated discharge today   Randall Cedar, MD 12/02/2019, 6:10 AM Pager: [phone] After 5pm on weekdays and 1pm on weekends: On Call pager [phone]

## 2019-12-02 NOTE — Discharge Summary (Signed)
Name: [name] MRN: [id number] DOB: Jun 29, 1965 54 y.o. PCP: Tawny Asal  Date of Admission: 11/30/2019  5:31 AM Date of Discharge: 12/02/2019 Attending Physician: Lucious Groves, DO  Discharge Diagnosis: 1. Mild-Moderate Exacerbation of COPD Hypertension, Uncontrolled  Discharge Medications: Allergies as of 12/02/2019      Reactions   Penicillins    From childhood: Has patient had a PCN reaction causing immediate rash, facial/tongue/throat swelling, SOB or lightheadedness with hypotension: Unknown Has patient had a PCN reaction causing severe rash involving mucus membranes or skin necrosis: Unknown Has patient had a PCN reaction that required hospitalization: Unknown Has patient had a PCN reaction occurring within the last 10 years: No If all of the above answers are "NO", then may proceed with Cephalosporin use.      Medication List    STOP taking these medications   azithromycin 250 MG tablet Commonly known as: ZITHROMAX     TAKE these medications   albuterol 108 (90 Base) MCG/ACT inhaler Commonly known as: VENTOLIN HFA Inhale 2 puffs into the lungs every 4 (four) hours as needed for wheezing or shortness of breath (cough). What changed: Another medication with the same name was removed. Continue taking this medication, and follow the directions you see here.   amLODipine 10 MG tablet Commonly known as: NORVASC Take 1 tablet (10 mg total) by mouth daily. Start taking on: December 03, 2019 What changed:   medication strength  how much to take   hydrochlorothiazide 12.5 MG tablet Commonly known as: HYDRODIURIL Take 1 tablet (12.5 mg total) by mouth daily.   lisinopril 20 MG tablet Commonly known as: ZESTRIL Take 1 tablet (20 mg total) by mouth daily.   predniSONE 20 MG tablet Commonly known as: DELTASONE Take 2 tablets (40 mg total) by mouth daily with breakfast. Start taking on: December 03, 2019   Spiriva HandiHaler 18 MCG inhalation  capsule Generic drug: tiotropium Place 1 capsule (18 mcg total) into inhaler and inhale daily.       Disposition and follow-up:   [name] was discharged from [facility] in Stable condition.  At the hospital follow up visit please address:  1.   Mild-Moderate Exacerbation of COPD: Came into hospital with trouble breathing and coughing. Reported he had been using his inhalers and they had not been helping. He was given a single dose of lasix to diuresis him. Started on Prednisone 40 mg daily and nebulizer and inhaler treatments as needed. His breathing steadily improved, he was able to be weaned of of 2L/min nasal canula. His breathing has improved along with his coughing. He is still producing phlegm but is stable.   HFpEF: Initially thought to be contributing to SOB. Was given a single dose of lasix. On review by cardiology they felt that his HF was not contributing to his SOB and so no further management was needed.   Hypertension, Uncontrolled:Had blood pressures in the 190's-210's/100-120. Was not taking any medication due to difficulty affording medication. He was restarted on Lisinopril 20 mg daily and 5 mg Amlodipine initially. This did not control his pressures enough so the Amlodipine was increased to 10 mg daily. This kept his pressures around 160's-180's. Have scheduled follow up with Primary Care Provider to further manage and make any necessary changes.   Abnormal LFT's withHx of Hepatitis C: [facility] Hep C panel still pending. Discussed with patient that programs exist to help patients afford the medication. Will follow up with Primary  Care Provider.   2.  Labs / imaging needed at time of follow-up: none  3.  Pending labs/ test needing follow-up: Hep C blood work   Follow-up Appointments:  Follow-up Information    [facility]. Go to.   Specialty: Family Medicine Why: already scheduled appointment Contact  information: [place] [id number] [place] Hospital Course by problem list: 1. Mild-Moderate Exacerbation of COPD: Came into hospital with trouble breathing and coughing. Reported he had been using his inhalers and they had not been helping. He was given a single dose of lasix to diuresis him. Started on Prednisone 40 mg daily and nebulizer and inhaler treatments as needed. His breathing steadily improved, he was able to be weaned of of 2L/min nasal canula. His breathing has improved along with his coughing. He is still producing phlegm but is stable.   HFpEF: Initially thought to be contributing to SOB. Was given a single dose of lasix. On review by cardiology they felt that his HF was not contributing to his SOB and so no further management was needed.   Hypertension, Uncontrolled:Had blood pressures in the 190's-210's/100-120. Was not taking any medication due to difficulty affording medication. He was restarted on Lisinopril 20 mg daily and 5 mg Amlodipine initially. This did not control his pressures enough so the Amlodipine was increased to 10 mg daily. This kept his pressures around 160's-180's. Have scheduled follow up with Primary Care Provider to further manage and make any necessary changes.   Abnormal LFT's withHx of Hepatitis C: Drew Hep C panel still pending. Discussed with patient that programs exist to help patients afford the medication. Will follow up with Primary Care Provider.    Discharge Vitals:   BP (!) 182/111 (BP Location: Left Wrist)   Pulse 63   Temp 98.4 F (36.9 C) (Oral)   Resp 18   Wt 97 kg   SpO2 98%   BMI 31.58 kg/m   Pertinent Labs, Studies, and Procedures:  BMP Latest Ref Rng & Units 12/02/2019 12/01/2019 11/30/2019  Glucose 70 - 99 mg/dL 120(H) 205(H) 135(H)  BUN 6 - 20 mg/dL 15 17 11   Creatinine 0.61 - 1.24 mg/dL 0.77 0.84 0.74  BUN/Creat Ratio 9 - 20 - - -  Sodium 135 - 145 mmol/L 136  134(L) 139  Potassium 3.5 - 5.1 mmol/L 3.9 3.5 3.4(L)  Chloride 98 - 111 mmol/L 101 98 101  CO2 22 - 32 mmol/L 23 27 29   Calcium 8.9 - 10.3 mg/dL 8.6(L) 8.8(L) 8.6(L)   CBC Latest Ref Rng & Units 12/01/2019 11/30/2019 10/21/2019  WBC 4.0 - 10.5 K/uL 10.8(H) 5.4 7.5  Hemoglobin 13.0 - 17.0 g/dL 16.1 15.2 17.0  Hematocrit 39 - 52 % 47.9 46.2 50.0  Platelets 150 - 400 K/uL 171 152 180   Hepatitis Latest Ref Rng & Units 10/19/2019  Hep B Surface Ag NON REACTIVE NON REACTIVE  Hep B IgM NON REACTIVE NON REACTIVE  Hep C Ab NON REACTIVE Reactive(A)  Hep A IgM NON REACTIVE NON REACTIVE   Chest X: The heart is borderline enlarged but stable. There is moderate tortuosity and calcification of the thoracic aorta. Peribronchial thickening and increased interstitial markings suggesting interstitial edema. No pleural effusions or focal infiltrates. No pulmonary lesions. The bony thorax is intact.  Discharge Instructions: Discharge Instructions    (HEART FAILURE PATIENTS) Call MD:  Anytime you have any of  the following symptoms: 1) 3 pound weight gain in 24 hours or 5 pounds in 1 week 2) shortness of breath, with or without a dry hacking cough 3) swelling in the hands, feet or stomach 4) if you have to sleep on extra pillows at night in order to breathe.   Complete by: As directed    Call MD for:   Complete by: As directed    Call MD for:  difficulty breathing, headache or visual disturbances   Complete by: As directed    Call MD for:  extreme fatigue   Complete by: As directed    Call MD for:  hives   Complete by: As directed    Call MD for:  persistant dizziness or light-headedness   Complete by: As directed    Call MD for:  persistant nausea and vomiting   Complete by: As directed    Call MD for:  redness, tenderness, or signs of infection (pain, swelling, redness, odor or green/yellow discharge around incision site)   Complete by: As directed    Call MD for:  severe uncontrolled pain   Complete  by: As directed    Call MD for:  temperature >100.4   Complete by: As directed    Diet - low sodium heart healthy   Complete by: As directed    Discharge instructions   Complete by: As directed    Mr. Wrinkle,  It was a pleasure taking care of you. Please make sure you finish your course (3 days) of Prednisone for this COPD exacerbation. Please make sure to take all you blood pressure medications and meet with your Primary Care Provider within 1 week to monitor your response. Further adjustments may need to be made to ensure control of your Blood Pressure. We discussed stopping smoking as that will help preserve your lung function and decrease the advancement of your COPD. We discussed stopping drinking and discussing Hepatitis C treatment with your Primary Care Provider. This will greatly help the health of your liver. Thank you   Increase activity slowly   Complete by: As directed       Signed: Briant Cedar, MD 12/02/2019, 11:07 AM   Pager: @[phone] @

## 2019-12-03 ENCOUNTER — Telehealth: Payer: Self-pay

## 2019-12-03 LAB — HEPATITIS C GENOTYPE

## 2019-12-03 LAB — HCV RNA QUANT RFLX ULTRA OR GENOTYP
HCV RNA Qnt(log copy/mL): 6.664 log10 IU/mL
HepC Qn: 4610000 IU/mL

## 2019-12-03 NOTE — Telephone Encounter (Signed)
Transition Care Management Follow-up Telephone Call Date of discharge and from where: 12/02/2019, [facility]   Call placed to patient # [phone], message left with call back requested to this CM # [phone]  He has an appointment at [facility] 12/09/2019 @ 1050

## 2019-12-04 ENCOUNTER — Telehealth: Payer: Self-pay

## 2019-12-04 NOTE — Telephone Encounter (Signed)
Transition Care Management Follow-up Telephone Call Date of discharge and from where: 12/02/2019, [facility]   2 attempt Call placed to patient # [phone], message left with call back requested to [facility] or Opal Sidles CM at  [phone]   Has an upcoming  appointment at [facility] 12/09/2019 @ 1050

## 2019-12-09 ENCOUNTER — Inpatient Hospital Stay (INDEPENDENT_AMBULATORY_CARE_PROVIDER_SITE_OTHER): Payer: Medicaid Other | Admitting: Primary Care

## 2019-12-11 DIAGNOSIS — Z03818 Encounter for observation for suspected exposure to other biological agents ruled out: Secondary | ICD-10-CM | POA: Diagnosis not present

## 2019-12-25 DIAGNOSIS — Z03818 Encounter for observation for suspected exposure to other biological agents ruled out: Secondary | ICD-10-CM | POA: Diagnosis not present

## 2020-01-08 DIAGNOSIS — Z03818 Encounter for observation for suspected exposure to other biological agents ruled out: Secondary | ICD-10-CM | POA: Diagnosis not present

## 2020-01-11 ENCOUNTER — Other Ambulatory Visit: Payer: Self-pay

## 2020-01-11 ENCOUNTER — Emergency Department (HOSPITAL_COMMUNITY)
Admission: EM | Admit: 2020-01-11 | Discharge: 2020-01-11 | Disposition: A | Discharge status: Home / Self Care | Payer: Medicaid Other | Attending: Emergency Medicine | Admitting: Emergency Medicine

## 2020-01-11 ENCOUNTER — Encounter (HOSPITAL_COMMUNITY): Payer: Self-pay | Admitting: Emergency Medicine

## 2020-01-11 ENCOUNTER — Emergency Department (HOSPITAL_COMMUNITY): Payer: Medicaid Other

## 2020-01-11 DIAGNOSIS — J449 Chronic obstructive pulmonary disease, unspecified: Secondary | ICD-10-CM | POA: Insufficient documentation

## 2020-01-11 DIAGNOSIS — I509 Heart failure, unspecified: Secondary | ICD-10-CM | POA: Diagnosis not present

## 2020-01-11 DIAGNOSIS — R05 Cough: Secondary | ICD-10-CM | POA: Diagnosis not present

## 2020-01-11 DIAGNOSIS — Z5321 Procedure and treatment not carried out due to patient leaving prior to being seen by health care provider: Secondary | ICD-10-CM | POA: Diagnosis not present

## 2020-01-11 DIAGNOSIS — R0689 Other abnormalities of breathing: Secondary | ICD-10-CM | POA: Diagnosis not present

## 2020-01-11 DIAGNOSIS — R140 Abdominal distension (gaseous): Secondary | ICD-10-CM | POA: Diagnosis not present

## 2020-01-11 DIAGNOSIS — R069 Unspecified abnormalities of breathing: Secondary | ICD-10-CM | POA: Diagnosis not present

## 2020-01-11 DIAGNOSIS — R0602 Shortness of breath: Secondary | ICD-10-CM | POA: Diagnosis not present

## 2020-01-11 DIAGNOSIS — J80 Acute respiratory distress syndrome: Secondary | ICD-10-CM | POA: Diagnosis not present

## 2020-01-11 LAB — BASIC METABOLIC PANEL
Anion gap: 9 (ref 5–15)
BUN: 10 mg/dL (ref 6–20)
CO2: 29 mmol/L (ref 22–32)
Calcium: 8.8 mg/dL — ABNORMAL LOW (ref 8.9–10.3)
Chloride: 101 mmol/L (ref 98–111)
Creatinine, Ser: 0.92 mg/dL (ref 0.61–1.24)
GFR calc Af Amer: >60 mL/min (ref >60)
GFR calc non Af Amer: >60 mL/min (ref >60)
Glucose, Bld: 186 mg/dL — ABNORMAL HIGH (ref 70–99)
Potassium: 3.3 mmol/L — ABNORMAL LOW (ref 3.5–5.1)
Sodium: 139 mmol/L (ref 135–145)

## 2020-01-11 LAB — CBC
HCT: 44.4 % (ref 39.0–52.0)
Hemoglobin: 15.2 g/dL (ref 13.0–17.0)
MCH: 36.5 pg — ABNORMAL HIGH (ref 26.0–34.0)
MCHC: 34.2 g/dL (ref 30.0–36.0)
MCV: 106.7 fL — ABNORMAL HIGH (ref 80.0–100.0)
Platelets: 160 10*3/uL (ref 150–400)
RBC: 4.16 MIL/uL — ABNORMAL LOW (ref 4.22–5.81)
RDW: 14.2 % (ref 11.5–15.5)
WBC: 4.3 10*3/uL (ref 4.0–10.5)
nRBC: 0 % (ref 0.0–0.2)

## 2020-01-11 NOTE — ED Notes (Signed)
Called to update vitals and no response 

## 2020-01-11 NOTE — ED Triage Notes (Signed)
Patient reports worsening SOB with productive cough /chest congestion onset yesterday unrelieved by his inhaler , denies fever or chills , no chest pain , history of COPD/CHF .

## 2020-01-22 DIAGNOSIS — Z03818 Encounter for observation for suspected exposure to other biological agents ruled out: Secondary | ICD-10-CM | POA: Diagnosis not present

## 2020-02-05 DIAGNOSIS — Z03818 Encounter for observation for suspected exposure to other biological agents ruled out: Secondary | ICD-10-CM | POA: Diagnosis not present

## 2020-02-19 DIAGNOSIS — Z03818 Encounter for observation for suspected exposure to other biological agents ruled out: Secondary | ICD-10-CM | POA: Diagnosis not present

## 2020-03-04 DIAGNOSIS — Z03818 Encounter for observation for suspected exposure to other biological agents ruled out: Secondary | ICD-10-CM | POA: Diagnosis not present

## 2020-03-13 ENCOUNTER — Emergency Department (HOSPITAL_COMMUNITY)
Admission: EM | Admit: 2020-03-13 | Discharge: 2020-03-13 | Disposition: A | Discharge status: Home / Self Care | Payer: Medicaid Other | Attending: Emergency Medicine | Admitting: Emergency Medicine

## 2020-03-13 ENCOUNTER — Emergency Department (HOSPITAL_COMMUNITY): Payer: Medicaid Other

## 2020-03-13 ENCOUNTER — Other Ambulatory Visit: Payer: Self-pay

## 2020-03-13 DIAGNOSIS — R0602 Shortness of breath: Secondary | ICD-10-CM | POA: Diagnosis not present

## 2020-03-13 DIAGNOSIS — Z20822 Contact with and (suspected) exposure to covid-19: Secondary | ICD-10-CM | POA: Diagnosis not present

## 2020-03-13 DIAGNOSIS — F17210 Nicotine dependence, cigarettes, uncomplicated: Secondary | ICD-10-CM | POA: Insufficient documentation

## 2020-03-13 DIAGNOSIS — I110 Hypertensive heart disease with heart failure: Secondary | ICD-10-CM | POA: Insufficient documentation

## 2020-03-13 DIAGNOSIS — Z79899 Other long term (current) drug therapy: Secondary | ICD-10-CM | POA: Insufficient documentation

## 2020-03-13 DIAGNOSIS — J441 Chronic obstructive pulmonary disease with (acute) exacerbation: Secondary | ICD-10-CM | POA: Insufficient documentation

## 2020-03-13 DIAGNOSIS — I517 Cardiomegaly: Secondary | ICD-10-CM | POA: Diagnosis not present

## 2020-03-13 DIAGNOSIS — I5033 Acute on chronic diastolic (congestive) heart failure: Secondary | ICD-10-CM | POA: Insufficient documentation

## 2020-03-13 LAB — CBC WITH DIFFERENTIAL/PLATELET
Abs Immature Granulocytes: 0.02 10*3/uL (ref 0.00–0.07)
Basophils Absolute: 0 10*3/uL (ref 0.0–0.1)
Basophils Relative: 0 %
Eosinophils Absolute: 0.1 10*3/uL (ref 0.0–0.5)
Eosinophils Relative: 2 %
HCT: 47.1 % (ref 39.0–52.0)
Hemoglobin: 16.1 g/dL (ref 13.0–17.0)
Immature Granulocytes: 0 %
Lymphocytes Relative: 29 %
Lymphs Abs: 1.4 10*3/uL (ref 0.7–4.0)
MCH: 36.3 pg — ABNORMAL HIGH (ref 26.0–34.0)
MCHC: 34.2 g/dL (ref 30.0–36.0)
MCV: 106.1 fL — ABNORMAL HIGH (ref 80.0–100.0)
Monocytes Absolute: 0.4 10*3/uL (ref 0.1–1.0)
Monocytes Relative: 9 %
Neutro Abs: 2.9 10*3/uL (ref 1.7–7.7)
Neutrophils Relative %: 60 %
Platelets: 164 10*3/uL (ref 150–400)
RBC: 4.44 MIL/uL (ref 4.22–5.81)
RDW: 13 % (ref 11.5–15.5)
WBC: 4.9 10*3/uL (ref 4.0–10.5)
nRBC: 0 % (ref 0.0–0.2)

## 2020-03-13 LAB — BASIC METABOLIC PANEL
Anion gap: 9 (ref 5–15)
BUN: 7 mg/dL (ref 6–20)
CO2: 29 mmol/L (ref 22–32)
Calcium: 9 mg/dL (ref 8.9–10.3)
Chloride: 97 mmol/L — ABNORMAL LOW (ref 98–111)
Creatinine, Ser: 0.89 mg/dL (ref 0.61–1.24)
GFR, Estimated: >60 mL/min (ref >60)
Glucose, Bld: 126 mg/dL — ABNORMAL HIGH (ref 70–99)
Potassium: 3.6 mmol/L (ref 3.5–5.1)
Sodium: 135 mmol/L (ref 135–145)

## 2020-03-13 LAB — BRAIN NATRIURETIC PEPTIDE: B Natriuretic Peptide: 275.5 pg/mL — ABNORMAL HIGH (ref 0.0–100.0)

## 2020-03-13 LAB — RESPIRATORY PANEL BY RT PCR (FLU A&B, COVID)
Influenza A by PCR: NEGATIVE
Influenza B by PCR: NEGATIVE
SARS Coronavirus 2 by RT PCR: NEGATIVE

## 2020-03-13 MED ORDER — FUROSEMIDE 10 MG/ML IJ SOLN
40.0000 mg | Freq: Once | INTRAMUSCULAR | Status: AC
  Administered 2020-03-13: 40 mg via INTRAVENOUS
  Filled 2020-03-13: qty 4

## 2020-03-13 MED ORDER — ALBUTEROL SULFATE HFA 108 (90 BASE) MCG/ACT IN AERS
2.0000 | INHALATION_SPRAY | RESPIRATORY_TRACT | Status: DC | PRN
  Administered 2020-03-13: 2 via RESPIRATORY_TRACT
  Filled 2020-03-13: qty 6.7

## 2020-03-13 MED ORDER — AEROCHAMBER PLUS FLO-VU LARGE MISC
1.0000 | Freq: Once | Status: AC
  Administered 2020-03-13: 1

## 2020-03-13 MED ORDER — PREDNISONE 20 MG PO TABS
60.0000 mg | ORAL_TABLET | Freq: Once | ORAL | Status: AC
  Administered 2020-03-13: 60 mg via ORAL
  Filled 2020-03-13: qty 3

## 2020-03-13 MED ORDER — IPRATROPIUM BROMIDE HFA 17 MCG/ACT IN AERS
2.0000 | INHALATION_SPRAY | Freq: Once | RESPIRATORY_TRACT | Status: AC
  Administered 2020-03-13: 2 via RESPIRATORY_TRACT
  Filled 2020-03-13: qty 12.9

## 2020-03-13 MED ORDER — FUROSEMIDE 20 MG PO TABS: 20.0000 mg | ORAL_TABLET | Freq: Two times a day (BID) | ORAL | 0 refills | Status: DC

## 2020-03-13 NOTE — ED Triage Notes (Signed)
Pt presents w/ c/o worsening SOB and productive cough x 2 days. Pt states he has COPD and CHF and has not been able to afford home medications as of late.

## 2020-03-13 NOTE — ED Notes (Signed)
Patient walked in room without o2 .sats was92% back on o2 at 94%at 2.5 liters

## 2020-03-13 NOTE — Discharge Instructions (Signed)
Please fill your Lasix tomorrow and take it for the next 3 days.  Use albuterol inhaler and Atrovent inhaler as needed for your shortness of breath.  Follow-up closely with your doctor for further care.
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
## 2020-03-13 NOTE — Care Management (Signed)
Patient states he left his phone in his friends car with all his numbers, needs a ride home, arranged transportation.

## 2020-03-13 NOTE — ED Notes (Signed)
O2 saturation at rest 96% on room air. O2 saturation with ambulation 94% on room air.

## 2020-03-14 MED FILL — FUROSEMIDE 20 MG TABS: 20 | 3 days supply | Qty: 6 | Fill #0

## 2020-03-15 ENCOUNTER — Emergency Department (HOSPITAL_COMMUNITY): Payer: Medicaid Other

## 2020-03-15 ENCOUNTER — Encounter (HOSPITAL_COMMUNITY): Payer: Self-pay

## 2020-03-15 ENCOUNTER — Other Ambulatory Visit: Payer: Self-pay

## 2020-03-15 ENCOUNTER — Emergency Department (HOSPITAL_COMMUNITY)
Admission: EM | Admit: 2020-03-15 | Discharge: 2020-03-15 | Disposition: A | Discharge status: Home / Self Care | Payer: Medicaid Other | Attending: Emergency Medicine | Admitting: Emergency Medicine

## 2020-03-15 DIAGNOSIS — I517 Cardiomegaly: Secondary | ICD-10-CM | POA: Diagnosis not present

## 2020-03-15 DIAGNOSIS — J441 Chronic obstructive pulmonary disease with (acute) exacerbation: Secondary | ICD-10-CM | POA: Insufficient documentation

## 2020-03-15 DIAGNOSIS — I5032 Chronic diastolic (congestive) heart failure: Secondary | ICD-10-CM | POA: Insufficient documentation

## 2020-03-15 DIAGNOSIS — R0602 Shortness of breath: Secondary | ICD-10-CM

## 2020-03-15 DIAGNOSIS — M549 Dorsalgia, unspecified: Secondary | ICD-10-CM | POA: Diagnosis not present

## 2020-03-15 DIAGNOSIS — R069 Unspecified abnormalities of breathing: Secondary | ICD-10-CM | POA: Diagnosis not present

## 2020-03-15 DIAGNOSIS — R0902 Hypoxemia: Secondary | ICD-10-CM | POA: Diagnosis not present

## 2020-03-15 DIAGNOSIS — Z79899 Other long term (current) drug therapy: Secondary | ICD-10-CM | POA: Diagnosis not present

## 2020-03-15 DIAGNOSIS — Z5900 Homelessness unspecified: Secondary | ICD-10-CM | POA: Diagnosis not present

## 2020-03-15 DIAGNOSIS — R52 Pain, unspecified: Secondary | ICD-10-CM | POA: Diagnosis not present

## 2020-03-15 DIAGNOSIS — Z20822 Contact with and (suspected) exposure to covid-19: Secondary | ICD-10-CM | POA: Insufficient documentation

## 2020-03-15 DIAGNOSIS — F17210 Nicotine dependence, cigarettes, uncomplicated: Secondary | ICD-10-CM | POA: Diagnosis not present

## 2020-03-15 DIAGNOSIS — I110 Hypertensive heart disease with heart failure: Secondary | ICD-10-CM | POA: Insufficient documentation

## 2020-03-15 LAB — COMPREHENSIVE METABOLIC PANEL
ALT: 98 U/L — ABNORMAL HIGH (ref 0–44)
AST: 62 U/L — ABNORMAL HIGH (ref 15–41)
Albumin: 3.7 g/dL (ref 3.5–5.0)
Alkaline Phosphatase: 69 U/L (ref 38–126)
Anion gap: 6 (ref 5–15)
BUN: 14 mg/dL (ref 6–20)
CO2: 30 mmol/L (ref 22–32)
Calcium: 8.6 mg/dL — ABNORMAL LOW (ref 8.9–10.3)
Chloride: 100 mmol/L (ref 98–111)
Creatinine, Ser: 0.82 mg/dL (ref 0.61–1.24)
GFR, Estimated: >60 mL/min (ref >60)
Glucose, Bld: 110 mg/dL — ABNORMAL HIGH (ref 70–99)
Potassium: 3.7 mmol/L (ref 3.5–5.1)
Sodium: 136 mmol/L (ref 135–145)
Total Bilirubin: 0.7 mg/dL (ref 0.3–1.2)
Total Protein: 7.4 g/dL (ref 6.5–8.1)

## 2020-03-15 LAB — CBC WITH DIFFERENTIAL/PLATELET
Abs Immature Granulocytes: 0.03 10*3/uL (ref 0.00–0.07)
Basophils Absolute: 0 10*3/uL (ref 0.0–0.1)
Basophils Relative: 0 %
Eosinophils Absolute: 0.1 10*3/uL (ref 0.0–0.5)
Eosinophils Relative: 2 %
HCT: 44.1 % (ref 39.0–52.0)
Hemoglobin: 15.1 g/dL (ref 13.0–17.0)
Immature Granulocytes: 1 %
Lymphocytes Relative: 34 %
Lymphs Abs: 1.7 10*3/uL (ref 0.7–4.0)
MCH: 37.3 pg — ABNORMAL HIGH (ref 26.0–34.0)
MCHC: 34.2 g/dL (ref 30.0–36.0)
MCV: 108.9 fL — ABNORMAL HIGH (ref 80.0–100.0)
Monocytes Absolute: 0.5 10*3/uL (ref 0.1–1.0)
Monocytes Relative: 10 %
Neutro Abs: 2.7 10*3/uL (ref 1.7–7.7)
Neutrophils Relative %: 53 %
Platelets: 150 10*3/uL (ref 150–400)
RBC: 4.05 MIL/uL — ABNORMAL LOW (ref 4.22–5.81)
RDW: 13.2 % (ref 11.5–15.5)
WBC: 5 10*3/uL (ref 4.0–10.5)
nRBC: 0 % (ref 0.0–0.2)

## 2020-03-15 LAB — BLOOD GAS, ARTERIAL
Acid-Base Excess: 5.8 mmol/L — ABNORMAL HIGH (ref 0.0–2.0)
Bicarbonate: 28.3 mmol/L — ABNORMAL HIGH (ref 20.0–28.0)
FIO2: 21
O2 Saturation: 92.5 %
Patient temperature: 37
pCO2 arterial: 54.9 mmHg — ABNORMAL HIGH (ref 32.0–48.0)
pH, Arterial: 7.37 (ref 7.350–7.450)
pO2, Arterial: 72.6 mmHg — ABNORMAL LOW (ref 83.0–108.0)

## 2020-03-15 LAB — BRAIN NATRIURETIC PEPTIDE: B Natriuretic Peptide: 189 pg/mL — ABNORMAL HIGH (ref 0.0–100.0)

## 2020-03-15 LAB — RESPIRATORY PANEL BY RT PCR (FLU A&B, COVID)
Influenza A by PCR: NEGATIVE
Influenza B by PCR: NEGATIVE
SARS Coronavirus 2 by RT PCR: NEGATIVE

## 2020-03-15 MED ORDER — PREDNISONE 20 MG PO TABS: 40.0000 mg | ORAL_TABLET | Freq: Every day | ORAL | 0 refills | Status: AC

## 2020-03-15 MED ORDER — MAGNESIUM SULFATE 2 GM/50ML IV SOLN
2.0000 g | Freq: Once | INTRAVENOUS | Status: AC
  Administered 2020-03-15: 2 g via INTRAVENOUS
  Filled 2020-03-15: qty 50

## 2020-03-15 MED ORDER — IPRATROPIUM BROMIDE 0.02 % IN SOLN
0.5000 mg | Freq: Once | RESPIRATORY_TRACT | Status: AC
  Administered 2020-03-15: 0.5 mg via RESPIRATORY_TRACT
  Filled 2020-03-15: qty 2.5

## 2020-03-15 MED ORDER — IPRATROPIUM-ALBUTEROL 20-100 MCG/ACT IN AERS
4.0000 | INHALATION_SPRAY | Freq: Four times a day (QID) | RESPIRATORY_TRACT | Status: DC
  Administered 2020-03-15: 4 via RESPIRATORY_TRACT
  Filled 2020-03-15: qty 4

## 2020-03-15 MED ORDER — METHYLPREDNISOLONE SODIUM SUCC 125 MG IJ SOLR
125.0000 mg | Freq: Once | INTRAMUSCULAR | Status: AC
  Administered 2020-03-15: 125 mg via INTRAVENOUS
  Filled 2020-03-15: qty 2

## 2020-03-15 MED ORDER — NALOXONE HCL 0.4 MG/ML IJ SOLN
0.2000 mg | Freq: Once | INTRAMUSCULAR | Status: AC
  Administered 2020-03-15: 0.2 mg via INTRAVENOUS
  Filled 2020-03-15: qty 1

## 2020-03-15 MED ORDER — ALBUTEROL SULFATE (2.5 MG/3ML) 0.083% IN NEBU
5.0000 mg | INHALATION_SOLUTION | Freq: Once | RESPIRATORY_TRACT | Status: AC
  Administered 2020-03-15: 5 mg via RESPIRATORY_TRACT
  Filled 2020-03-15: qty 6

## 2020-03-15 NOTE — ED Notes (Signed)
Pt refusing to ambulate with pulse ox. Pt states "I want my papers so I can go home. I will just die there." EDP notified.

## 2020-03-15 NOTE — ED Notes (Signed)
EDP made aware of pt HR 38-42 when sleeping, pinpoint pupils.

## 2020-03-15 NOTE — ED Notes (Signed)
Pt ripped EKG leads and BP cuff off, refusing vitals at this time.

## 2020-03-15 NOTE — ED Notes (Signed)
Pupils noted to be pinpoint, pt drowsy but arousable upon calling, pt oriented x4, pt admits to taking percocet last night but states he is just sleepy because he was scared to go to sleep last night because his heartrate drops low when he sleeps is what he has been told. Pt HR noted to drop 38-42 when pt sleeping.

## 2020-03-15 NOTE — ED Notes (Signed)
Pt cussing staff and yelling at staff stating "I asked for fucking food. Y'all treating me like I am a damn bird giving me crackers, I seen them bring food trays around. You are treating me like fucking shit" Informed pt we have to wait for the doctor to give an order for a food tray and he can not talk to and mistreat staff, yelling and cussing at staff. Informed pt the food trays he seen was for other patients who were already admitted. Pt continues to argue with nursing staff, security called. Discharge papers given but patient refusing to let nurse explain discharge papers or sign disposition.

## 2020-03-15 NOTE — ED Notes (Signed)
Respiratory called and informed of order for ABG

## 2020-03-15 NOTE — ED Triage Notes (Signed)
Pt brought to ED via [facility] for worsening SOB and productive cough x 3 days. Pt with hx of COPD and CHF. Pt seen at [facility] ED yesterday. Pt also c/o lower back pain and right hip pain.

## 2020-03-15 NOTE — Discharge Instructions (Addendum)
Use your albuterol inhaler every 4 hours for the next 2 days.  After this, use as needed for shortness of breath, chest tightness, wheezing. It is extremely important to take up your Lasix and take it as prescribed, as well as the prednisone which you will need to start tomorrow.  This will help with your breathing in your lungs. Return to the emergency room with any new, worsening, concerning symptoms.
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
## 2020-03-18 DIAGNOSIS — Z03818 Encounter for observation for suspected exposure to other biological agents ruled out: Secondary | ICD-10-CM | POA: Diagnosis not present

## 2020-03-26 DIAGNOSIS — Z720 Tobacco use: Secondary | ICD-10-CM | POA: Diagnosis not present

## 2020-03-26 DIAGNOSIS — J441 Chronic obstructive pulmonary disease with (acute) exacerbation: Secondary | ICD-10-CM | POA: Diagnosis not present

## 2020-03-26 DIAGNOSIS — I10 Essential (primary) hypertension: Secondary | ICD-10-CM | POA: Diagnosis not present

## 2020-03-26 DIAGNOSIS — R0602 Shortness of breath: Secondary | ICD-10-CM | POA: Diagnosis not present

## 2020-03-26 DIAGNOSIS — R069 Unspecified abnormalities of breathing: Secondary | ICD-10-CM | POA: Diagnosis not present

## 2020-04-01 DIAGNOSIS — Z03818 Encounter for observation for suspected exposure to other biological agents ruled out: Secondary | ICD-10-CM | POA: Diagnosis not present

## 2020-04-05 ENCOUNTER — Other Ambulatory Visit: Payer: Self-pay

## 2020-04-05 ENCOUNTER — Telehealth (INDEPENDENT_AMBULATORY_CARE_PROVIDER_SITE_OTHER): Payer: Medicaid Other | Admitting: Primary Care

## 2020-04-05 DIAGNOSIS — J441 Chronic obstructive pulmonary disease with (acute) exacerbation: Secondary | ICD-10-CM

## 2020-04-05 DIAGNOSIS — Z760 Encounter for issue of repeat prescription: Secondary | ICD-10-CM

## 2020-04-05 DIAGNOSIS — G8929 Other chronic pain: Secondary | ICD-10-CM | POA: Diagnosis not present

## 2020-04-05 DIAGNOSIS — M545 Low back pain, unspecified: Secondary | ICD-10-CM

## 2020-04-05 DIAGNOSIS — Z1211 Encounter for screening for malignant neoplasm of colon: Secondary | ICD-10-CM

## 2020-04-05 DIAGNOSIS — I10 Essential (primary) hypertension: Secondary | ICD-10-CM

## 2020-04-05 MED ORDER — HYDROCHLOROTHIAZIDE 25 MG PO TABS: 25.0000 mg | ORAL_TABLET | Freq: Every day | ORAL | 1 refills | Status: DC

## 2020-04-05 MED ORDER — AMLODIPINE BESYLATE 10 MG PO TABS: 10.0000 mg | ORAL_TABLET | Freq: Every day | ORAL | 1 refills | Status: DC

## 2020-04-05 MED ORDER — ALBUTEROL SULFATE HFA 108 (90 BASE) MCG/ACT IN AERS: 2.0000 | INHALATION_SPRAY | RESPIRATORY_TRACT | 1 refills | Status: DC | PRN

## 2020-04-05 MED ORDER — SPIRIVA HANDIHALER 18 MCG IN CAPS
18.0000 ug | ORAL_CAPSULE | Freq: Every day | RESPIRATORY_TRACT | 0 refills | Status: DC
Start: 2020-04-05 — End: 2020-06-27

## 2020-04-05 MED ORDER — ALBUTEROL SULFATE HFA 108 (90 BASE) MCG/ACT IN AERS: 2.0000 | INHALATION_SPRAY | RESPIRATORY_TRACT | 0 refills | Status: DC | PRN

## 2020-04-05 MED ORDER — LISINOPRIL 20 MG PO TABS: 20.0000 mg | ORAL_TABLET | Freq: Every day | ORAL | 1 refills | Status: DC

## 2020-04-05 NOTE — Progress Notes (Signed)
Patient would like Rx for nebulizer  Pt is not able to check Bp at home

## 2020-04-05 NOTE — Progress Notes (Addendum)
Telephone Note  I connected with [name] on 04/05/20 at  2:50 PM EST by telephone and verified that I am speaking with the correct person using two identifiers.  Location: Patient: home  Provider: [name] @[facility]    I discussed the limitations, risks, security and privacy concerns of performing an evaluation and management service by telephone and the availability of in person appointments. I also discussed with the patient that there may be a patient responsible charge related to this service. The patient expressed understanding and agreed to proceed.   History of Present Illness: [name] is a 54 y.o.male presents for follow up from the hospital-He was brought to the hospital by EMS.  Oxygen saturation were noted to be 88% on room air. Admit date to the hospital was 06/25/20, patient was discharged from the hospital on 06/27/20, patient was admitted for: COVID and  pneumonia   Past Medical History:  Diagnosis Date  . CHF (congestive heart failure) ([place])   . Chronic bronchitis ([place])   . Chronic lower back pain   . COPD (chronic obstructive pulmonary disease) ([place])   . Diskitis   . Hypertension   . Panic attacks   . Tobacco abuse      Allergies  Allergen Reactions  . Penicillins     From childhood: Has patient had a PCN reaction causing immediate rash, facial/tongue/throat swelling, SOB or lightheadedness with hypotension: Unknown Has patient had a PCN reaction causing severe rash involving mucus membranes or skin necrosis: Unknown Has patient had a PCN reaction that required hospitalization: Unknown Has patient had a PCN reaction occurring within the last 10 years: No If all of the above answers are "NO", then may proceed with Cephalosporin use.      Observations/Objective: Review of Systems  Respiratory: Positive for cough and shortness of breath.   All other systems reviewed and are negative.   Assessment and Plan: Sohil was seen today for referral  and medication refill.  Diagnoses and all orders for this visit:  COPD exacerbation ([place]) Several ED visit for COPD exacerbation  -     Ambulatory referral to Pulmonology  Essential hypertension Counseled on blood pressure goal of less than 130/80, low-sodium, DASH diet, medication compliance, 150 minutes of moderate intensity exercise per week. Discussed medication compliance, adverse effects.  hydrochlorothiazide (HYDRODIURIL) 25 MG tablet; Take 1 tablet (25 mg total) by mouth daily. -     lisinopril (ZESTRIL) 20 MG tablet; Take 1 tablet (20 mg total) by mouth daily.  Chronic bilateral low back pain without sciatica -     Ambulatory referral to Pain Clinic  Colon cancer screening Refer to gastrologist   Medication refill -     hydrochlorothiazide (HYDRODIURIL) 25 MG tablet; Take 1 tablet (25 mg total) by mouth daily.  Other orders -     tiotropium (SPIRIVA HANDIHALER) 18 MCG inhalation capsule; Place 1 capsule (18 mcg total) into inhaler and inhale daily. -     Discontinue: albuterol (VENTOLIN HFA) 108 (90 Base) MCG/ACT inhaler; Inhale 2 puffs into the lungs every 4 (four) hours as needed for wheezing or shortness of breath (cough). -     amLODipine (NORVASC) 10 MG tablet; Take 1 tablet (10 mg total) by mouth daily. -     Discontinue: albuterol (VENTOLIN HFA) 108 (90 Base) MCG/ACT inhaler; Inhale 2 puffs into the lungs every 4 (four) hours as needed for wheezing or shortness of breath (cough). -     albuterol (VENTOLIN HFA) 108 (90  Base) MCG/ACT inhaler; Inhale 2 puffs into the lungs every 4 (four) hours as needed for wheezing or shortness of breath (cough).    Follow Up Instructions:    I discussed the assessment and treatment plan with the patient. The patient was provided an opportunity to ask questions and all were answered. The patient agreed with the plan and demonstrated an understanding of the instructions.   The patient was advised to call back or seek an in-person  evaluation if the symptoms worsen or if the condition fails to improve as anticipated.  I provided 24 minutes of non-face-to-face time during this encounter.   [name], NP

## 2020-04-15 DIAGNOSIS — Z03818 Encounter for observation for suspected exposure to other biological agents ruled out: Secondary | ICD-10-CM | POA: Diagnosis not present

## 2020-04-29 DIAGNOSIS — Z03818 Encounter for observation for suspected exposure to other biological agents ruled out: Secondary | ICD-10-CM | POA: Diagnosis not present

## 2020-05-12 DIAGNOSIS — Z03818 Encounter for observation for suspected exposure to other biological agents ruled out: Secondary | ICD-10-CM | POA: Diagnosis not present

## 2020-05-15 ENCOUNTER — Emergency Department (HOSPITAL_COMMUNITY): Payer: Medicaid Other

## 2020-05-15 ENCOUNTER — Other Ambulatory Visit: Payer: Self-pay

## 2020-05-15 ENCOUNTER — Emergency Department (HOSPITAL_COMMUNITY)
Admission: EM | Admit: 2020-05-15 | Discharge: 2020-05-15 | Disposition: A | Discharge status: Home / Self Care | Payer: Medicaid Other | Attending: Emergency Medicine | Admitting: Emergency Medicine

## 2020-05-15 ENCOUNTER — Encounter (HOSPITAL_COMMUNITY): Payer: Self-pay | Admitting: Emergency Medicine

## 2020-05-15 DIAGNOSIS — Z7951 Long term (current) use of inhaled steroids: Secondary | ICD-10-CM | POA: Insufficient documentation

## 2020-05-15 DIAGNOSIS — I10 Essential (primary) hypertension: Secondary | ICD-10-CM | POA: Diagnosis not present

## 2020-05-15 DIAGNOSIS — I110 Hypertensive heart disease with heart failure: Secondary | ICD-10-CM | POA: Diagnosis not present

## 2020-05-15 DIAGNOSIS — F410 Panic disorder [episodic paroxysmal anxiety] without agoraphobia: Secondary | ICD-10-CM | POA: Insufficient documentation

## 2020-05-15 DIAGNOSIS — R079 Chest pain, unspecified: Secondary | ICD-10-CM | POA: Diagnosis not present

## 2020-05-15 DIAGNOSIS — I517 Cardiomegaly: Secondary | ICD-10-CM | POA: Diagnosis not present

## 2020-05-15 DIAGNOSIS — T405X1A Poisoning by cocaine, accidental (unintentional), initial encounter: Secondary | ICD-10-CM | POA: Insufficient documentation

## 2020-05-15 DIAGNOSIS — J449 Chronic obstructive pulmonary disease, unspecified: Secondary | ICD-10-CM | POA: Insufficient documentation

## 2020-05-15 DIAGNOSIS — F17210 Nicotine dependence, cigarettes, uncomplicated: Secondary | ICD-10-CM | POA: Diagnosis not present

## 2020-05-15 DIAGNOSIS — F1910 Other psychoactive substance abuse, uncomplicated: Secondary | ICD-10-CM

## 2020-05-15 DIAGNOSIS — R457 State of emotional shock and stress, unspecified: Secondary | ICD-10-CM | POA: Diagnosis not present

## 2020-05-15 DIAGNOSIS — R0902 Hypoxemia: Secondary | ICD-10-CM | POA: Diagnosis not present

## 2020-05-15 DIAGNOSIS — Z79899 Other long term (current) drug therapy: Secondary | ICD-10-CM | POA: Insufficient documentation

## 2020-05-15 DIAGNOSIS — I5033 Acute on chronic diastolic (congestive) heart failure: Secondary | ICD-10-CM | POA: Diagnosis not present

## 2020-05-15 DIAGNOSIS — Z716 Tobacco abuse counseling: Secondary | ICD-10-CM | POA: Diagnosis not present

## 2020-05-15 LAB — COMPREHENSIVE METABOLIC PANEL
ALT: 123 U/L — ABNORMAL HIGH (ref 0–44)
AST: 96 U/L — ABNORMAL HIGH (ref 15–41)
Albumin: 4.1 g/dL (ref 3.5–5.0)
Alkaline Phosphatase: 71 U/L (ref 38–126)
Anion gap: 9 (ref 5–15)
BUN: 15 mg/dL (ref 6–20)
CO2: 27 mmol/L (ref 22–32)
Calcium: 9.1 mg/dL (ref 8.9–10.3)
Chloride: 97 mmol/L — ABNORMAL LOW (ref 98–111)
Creatinine, Ser: 0.94 mg/dL (ref 0.61–1.24)
GFR, Estimated: >60 mL/min (ref >60)
Glucose, Bld: 116 mg/dL — ABNORMAL HIGH (ref 70–99)
Potassium: 4.1 mmol/L (ref 3.5–5.1)
Sodium: 133 mmol/L — ABNORMAL LOW (ref 135–145)
Total Bilirubin: 1.1 mg/dL (ref 0.3–1.2)
Total Protein: 8.3 g/dL — ABNORMAL HIGH (ref 6.5–8.1)

## 2020-05-15 LAB — CBC WITH DIFFERENTIAL/PLATELET
Abs Immature Granulocytes: 0.04 10*3/uL (ref 0.00–0.07)
Basophils Absolute: 0 10*3/uL (ref 0.0–0.1)
Basophils Relative: 0 %
Eosinophils Absolute: 0.1 10*3/uL (ref 0.0–0.5)
Eosinophils Relative: 2 %
HCT: 45.4 % (ref 39.0–52.0)
Hemoglobin: 15.9 g/dL (ref 13.0–17.0)
Immature Granulocytes: 1 %
Lymphocytes Relative: 33 %
Lymphs Abs: 1.6 10*3/uL (ref 0.7–4.0)
MCH: 36.3 pg — ABNORMAL HIGH (ref 26.0–34.0)
MCHC: 35 g/dL (ref 30.0–36.0)
MCV: 103.7 fL — ABNORMAL HIGH (ref 80.0–100.0)
Monocytes Absolute: 0.6 10*3/uL (ref 0.1–1.0)
Monocytes Relative: 11 %
Neutro Abs: 2.7 10*3/uL (ref 1.7–7.7)
Neutrophils Relative %: 53 %
Platelets: 171 10*3/uL (ref 150–400)
RBC: 4.38 MIL/uL (ref 4.22–5.81)
RDW: 13.5 % (ref 11.5–15.5)
WBC: 5 10*3/uL (ref 4.0–10.5)
nRBC: 0 % (ref 0.0–0.2)

## 2020-05-15 LAB — TROPONIN I (HIGH SENSITIVITY)
Troponin I (High Sensitivity): 28 ng/L — ABNORMAL HIGH (ref <18)
Troponin I (High Sensitivity): 23 ng/L — ABNORMAL HIGH (ref <18)
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
## 2020-05-15 NOTE — ED Triage Notes (Signed)
Pt c/o anxiety, insomnia, and numbness and tingling all over. Pt states he snorted 1 gram cocaine throughout the day.

## 2020-05-15 NOTE — ED Notes (Signed)
Pt denies chest pain at this time. Pt says he has used cocaine throughout the day. Reports he has not slept in over 24 hours. Pt says he "feels weird"

## 2020-05-15 NOTE — Discharge Instructions (Addendum)
Work-up without any acute abnormalities. Symptoms most likely secondary to the substance abuse. Return for any new or worse symptoms. Recommend blood pressure check and follow-up. Take your blood pressure medicines as directed.

## 2020-05-27 DIAGNOSIS — Z03818 Encounter for observation for suspected exposure to other biological agents ruled out: Secondary | ICD-10-CM | POA: Diagnosis not present

## 2020-06-01 ENCOUNTER — Institutional Professional Consult (permissible substitution): Payer: Medicaid Other | Admitting: Internal Medicine

## 2020-06-01 NOTE — Progress Notes (Deleted)
   [name], male    DOB: [birth year],    MRN: [id number]   Brief patient profile:      History of Present Illness  06/01/2020  Pulmonary/ 1st office eval/ Melvyn Novas / [place] [facility]  No chief complaint on file.    Dyspnea:  *** Cough: *** Sleep: *** SABA use:   Past Medical History:  Diagnosis Date  . CHF (congestive heart failure) ([place])   . Chronic bronchitis ([place])   . Chronic lower back pain   . COPD (chronic obstructive pulmonary disease) ([place])   . Diskitis   . Hypertension   . Panic attacks   . Tobacco abuse     Outpatient Medications Prior to Visit  Medication Sig Dispense Refill  . albuterol (VENTOLIN HFA) 108 (90 Base) MCG/ACT inhaler Inhale 2 puffs into the lungs every 4 (four) hours as needed for wheezing or shortness of breath (cough). 18 g 1  . amLODipine (NORVASC) 10 MG tablet Take 1 tablet (10 mg total) by mouth daily. 30 tablet 1  . furosemide (LASIX) 20 MG tablet Take 1 tablet (20 mg total) by mouth 2 (two) times daily. 6 tablet 0  . hydrochlorothiazide (HYDRODIURIL) 25 MG tablet Take 1 tablet (25 mg total) by mouth daily. 30 tablet 1  . lisinopril (ZESTRIL) 20 MG tablet Take 1 tablet (20 mg total) by mouth daily. 30 tablet 1  . tiotropium (SPIRIVA HANDIHALER) 18 MCG inhalation capsule Place 1 capsule (18 mcg total) into inhaler and inhale daily. 30 capsule 0   No facility-administered medications prior to visit.     Objective:     There were no vitals taken for this visit.        I personally reviewed images and agree with radiology impression as follows:  CXR:   05/15/20 Cardiomegaly with chronic interstitial coarsening    Assessment   No problem-specific Assessment & Plan notes found for this encounter.     Christinia Gully, MD 06/01/2020

## 2020-06-10 DIAGNOSIS — Z03818 Encounter for observation for suspected exposure to other biological agents ruled out: Secondary | ICD-10-CM | POA: Diagnosis not present

## 2020-06-22 ENCOUNTER — Encounter (HOSPITAL_COMMUNITY): Payer: Self-pay

## 2020-06-22 ENCOUNTER — Inpatient Hospital Stay (HOSPITAL_COMMUNITY)
Admission: EM | Admit: 2020-06-22 | Discharge: 2020-06-23 | DRG: 871 | Discharge status: Against Medical Advice | Payer: Medicaid Other | Attending: Internal Medicine | Admitting: Internal Medicine

## 2020-06-22 ENCOUNTER — Other Ambulatory Visit: Payer: Self-pay

## 2020-06-22 ENCOUNTER — Emergency Department (HOSPITAL_COMMUNITY): Payer: Medicaid Other

## 2020-06-22 DIAGNOSIS — A4189 Other specified sepsis: Secondary | ICD-10-CM | POA: Diagnosis present

## 2020-06-22 DIAGNOSIS — J80 Acute respiratory distress syndrome: Secondary | ICD-10-CM | POA: Diagnosis not present

## 2020-06-22 DIAGNOSIS — Z823 Family history of stroke: Secondary | ICD-10-CM

## 2020-06-22 DIAGNOSIS — E669 Obesity, unspecified: Secondary | ICD-10-CM

## 2020-06-22 DIAGNOSIS — R069 Unspecified abnormalities of breathing: Secondary | ICD-10-CM | POA: Diagnosis not present

## 2020-06-22 DIAGNOSIS — Z7151 Drug abuse counseling and surveillance of drug abuser: Secondary | ICD-10-CM

## 2020-06-22 DIAGNOSIS — F10139 Alcohol abuse with withdrawal, unspecified: Secondary | ICD-10-CM | POA: Diagnosis present

## 2020-06-22 DIAGNOSIS — Z87891 Personal history of nicotine dependence: Secondary | ICD-10-CM | POA: Diagnosis not present

## 2020-06-22 DIAGNOSIS — G4733 Obstructive sleep apnea (adult) (pediatric): Secondary | ICD-10-CM | POA: Diagnosis present

## 2020-06-22 DIAGNOSIS — Z8249 Family history of ischemic heart disease and other diseases of the circulatory system: Secondary | ICD-10-CM | POA: Diagnosis not present

## 2020-06-22 DIAGNOSIS — J440 Chronic obstructive pulmonary disease with acute lower respiratory infection: Secondary | ICD-10-CM | POA: Diagnosis present

## 2020-06-22 DIAGNOSIS — R0602 Shortness of breath: Secondary | ICD-10-CM | POA: Diagnosis not present

## 2020-06-22 DIAGNOSIS — U071 COVID-19: Secondary | ICD-10-CM | POA: Diagnosis not present

## 2020-06-22 DIAGNOSIS — K219 Gastro-esophageal reflux disease without esophagitis: Secondary | ICD-10-CM | POA: Diagnosis not present

## 2020-06-22 DIAGNOSIS — R7401 Elevation of levels of liver transaminase levels: Secondary | ICD-10-CM | POA: Diagnosis not present

## 2020-06-22 DIAGNOSIS — J449 Chronic obstructive pulmonary disease, unspecified: Secondary | ICD-10-CM | POA: Diagnosis not present

## 2020-06-22 DIAGNOSIS — Z6836 Body mass index (BMI) 36.0-36.9, adult: Secondary | ICD-10-CM

## 2020-06-22 DIAGNOSIS — R739 Hyperglycemia, unspecified: Secondary | ICD-10-CM

## 2020-06-22 DIAGNOSIS — F1010 Alcohol abuse, uncomplicated: Secondary | ICD-10-CM | POA: Diagnosis present

## 2020-06-22 DIAGNOSIS — R0689 Other abnormalities of breathing: Secondary | ICD-10-CM | POA: Diagnosis not present

## 2020-06-22 DIAGNOSIS — F1910 Other psychoactive substance abuse, uncomplicated: Secondary | ICD-10-CM | POA: Diagnosis not present

## 2020-06-22 DIAGNOSIS — J1282 Pneumonia due to coronavirus disease 2019: Secondary | ICD-10-CM | POA: Diagnosis present

## 2020-06-22 DIAGNOSIS — R0902 Hypoxemia: Secondary | ICD-10-CM | POA: Diagnosis not present

## 2020-06-22 DIAGNOSIS — I517 Cardiomegaly: Secondary | ICD-10-CM | POA: Diagnosis not present

## 2020-06-22 DIAGNOSIS — J9622 Acute and chronic respiratory failure with hypercapnia: Secondary | ICD-10-CM | POA: Diagnosis not present

## 2020-06-22 DIAGNOSIS — I10 Essential (primary) hypertension: Secondary | ICD-10-CM | POA: Diagnosis not present

## 2020-06-22 DIAGNOSIS — R7303 Prediabetes: Secondary | ICD-10-CM | POA: Diagnosis present

## 2020-06-22 DIAGNOSIS — F10230 Alcohol dependence with withdrawal, uncomplicated: Secondary | ICD-10-CM | POA: Diagnosis not present

## 2020-06-22 DIAGNOSIS — G8929 Other chronic pain: Secondary | ICD-10-CM | POA: Diagnosis present

## 2020-06-22 DIAGNOSIS — I110 Hypertensive heart disease with heart failure: Secondary | ICD-10-CM | POA: Diagnosis present

## 2020-06-22 DIAGNOSIS — F1410 Cocaine abuse, uncomplicated: Secondary | ICD-10-CM | POA: Diagnosis not present

## 2020-06-22 DIAGNOSIS — A419 Sepsis, unspecified organism: Secondary | ICD-10-CM | POA: Diagnosis not present

## 2020-06-22 DIAGNOSIS — F410 Panic disorder [episodic paroxysmal anxiety] without agoraphobia: Secondary | ICD-10-CM | POA: Diagnosis present

## 2020-06-22 DIAGNOSIS — J9601 Acute respiratory failure with hypoxia: Secondary | ICD-10-CM | POA: Diagnosis present

## 2020-06-22 DIAGNOSIS — I5032 Chronic diastolic (congestive) heart failure: Secondary | ICD-10-CM | POA: Diagnosis present

## 2020-06-22 DIAGNOSIS — J9602 Acute respiratory failure with hypercapnia: Secondary | ICD-10-CM | POA: Diagnosis present

## 2020-06-22 DIAGNOSIS — J9621 Acute and chronic respiratory failure with hypoxia: Secondary | ICD-10-CM | POA: Diagnosis not present

## 2020-06-22 LAB — C-REACTIVE PROTEIN: CRP: 9.1 mg/dL — ABNORMAL HIGH (ref <1.0)

## 2020-06-22 LAB — URINALYSIS, ROUTINE W REFLEX MICROSCOPIC
Bacteria, UA: NONE SEEN
Bilirubin Urine: NEGATIVE
Glucose, UA: NEGATIVE mg/dL
Ketones, ur: NEGATIVE mg/dL
Leukocytes,Ua: NEGATIVE
Nitrite: NEGATIVE
Protein, ur: NEGATIVE mg/dL
Specific Gravity, Urine: 1.009 (ref 1.005–1.030)
pH: 6 (ref 5.0–8.0)

## 2020-06-22 LAB — APTT: aPTT: 24 seconds (ref 24–36)

## 2020-06-22 LAB — COMPREHENSIVE METABOLIC PANEL
ALT: 62 U/L — ABNORMAL HIGH (ref 0–44)
AST: 68 U/L — ABNORMAL HIGH (ref 15–41)
Albumin: 3.3 g/dL — ABNORMAL LOW (ref 3.5–5.0)
Alkaline Phosphatase: 57 U/L (ref 38–126)
Anion gap: 8 (ref 5–15)
BUN: 12 mg/dL (ref 6–20)
CO2: 29 mmol/L (ref 22–32)
Calcium: 8.4 mg/dL — ABNORMAL LOW (ref 8.9–10.3)
Chloride: 90 mmol/L — ABNORMAL LOW (ref 98–111)
Creatinine, Ser: 0.67 mg/dL (ref 0.61–1.24)
GFR, Estimated: >60 mL/min (ref >60)
Glucose, Bld: 145 mg/dL — ABNORMAL HIGH (ref 70–99)
Potassium: 3.7 mmol/L (ref 3.5–5.1)
Sodium: 127 mmol/L — ABNORMAL LOW (ref 135–145)
Total Bilirubin: 1 mg/dL (ref 0.3–1.2)
Total Protein: 8.4 g/dL — ABNORMAL HIGH (ref 6.5–8.1)

## 2020-06-22 LAB — CBC
HCT: 47.5 % (ref 39.0–52.0)
Hemoglobin: 16.5 g/dL (ref 13.0–17.0)
MCH: 36.5 pg — ABNORMAL HIGH (ref 26.0–34.0)
MCHC: 34.7 g/dL (ref 30.0–36.0)
MCV: 105.1 fL — ABNORMAL HIGH (ref 80.0–100.0)
Platelets: 153 10*3/uL (ref 150–400)
RBC: 4.52 MIL/uL (ref 4.22–5.81)
RDW: 13.3 % (ref 11.5–15.5)
WBC: 7.2 10*3/uL (ref 4.0–10.5)
nRBC: 0 % (ref 0.0–0.2)

## 2020-06-22 LAB — CBG MONITORING, ED: Glucose-Capillary: 376 mg/dL — ABNORMAL HIGH (ref 70–99)

## 2020-06-22 LAB — CBC WITH DIFFERENTIAL/PLATELET
Band Neutrophils: 6 %
Basophils Absolute: 0 10*3/uL (ref 0.0–0.1)
Basophils Relative: 0 %
Eosinophils Absolute: 0.1 10*3/uL (ref 0.0–0.5)
Eosinophils Relative: 1 %
HCT: 47.2 % (ref 39.0–52.0)
Hemoglobin: 16.2 g/dL (ref 13.0–17.0)
Lymphocytes Relative: 18 %
Lymphs Abs: 1.2 10*3/uL (ref 0.7–4.0)
MCH: 35.8 pg — ABNORMAL HIGH (ref 26.0–34.0)
MCHC: 34.3 g/dL (ref 30.0–36.0)
MCV: 104.2 fL — ABNORMAL HIGH (ref 80.0–100.0)
Metamyelocytes Relative: 2 %
Monocytes Absolute: 0.3 10*3/uL (ref 0.1–1.0)
Monocytes Relative: 5 %
Neutro Abs: 5 10*3/uL (ref 1.7–7.7)
Neutrophils Relative %: 68 %
Platelets: 151 10*3/uL (ref 150–400)
RBC: 4.53 MIL/uL (ref 4.22–5.81)
RDW: 13.3 % (ref 11.5–15.5)
WBC: 6.8 10*3/uL (ref 4.0–10.5)
nRBC: 0 % (ref 0.0–0.2)

## 2020-06-22 LAB — BLOOD GAS, ARTERIAL
Acid-Base Excess: 7.1 mmol/L — ABNORMAL HIGH (ref 0.0–2.0)
Acid-Base Excess: 7.1 mmol/L — ABNORMAL HIGH (ref 0.0–2.0)
Bicarbonate: 26.4 mmol/L (ref 20.0–28.0)
Bicarbonate: 27.6 mmol/L (ref 20.0–28.0)
Drawn by: 38235
FIO2: 100
FIO2: 30
Mode: POSITIVE
O2 Saturation: 98 %
O2 Saturation: 91.6 %
Patient temperature: 38.6
Patient temperature: 36.1
RATE: 12 resp/min
pCO2 arterial: 101 mmHg (ref 32.0–48.0)
pCO2 arterial: 74 mmHg (ref 32.0–48.0)
pH, Arterial: 7.177 — CL (ref 7.350–7.450)
pH, Arterial: 7.276 — ABNORMAL LOW (ref 7.350–7.450)
pO2, Arterial: 164 mmHg — ABNORMAL HIGH (ref 83.0–108.0)
pO2, Arterial: 67.7 mmHg — ABNORMAL LOW (ref 83.0–108.0)

## 2020-06-22 LAB — RAPID URINE DRUG SCREEN, HOSP PERFORMED
Amphetamines: NOT DETECTED
Barbiturates: NOT DETECTED
Benzodiazepines: NOT DETECTED
Cocaine: POSITIVE — AB
Opiates: NOT DETECTED
Tetrahydrocannabinol: NOT DETECTED

## 2020-06-22 LAB — CREATININE, SERUM
Creatinine, Ser: 0.68 mg/dL (ref 0.61–1.24)
GFR, Estimated: >60 mL/min (ref >60)

## 2020-06-22 LAB — LACTIC ACID, PLASMA
Lactic Acid, Venous: 1.1 mmol/L (ref 0.5–1.9)
Lactic Acid, Venous: 0.9 mmol/L (ref 0.5–1.9)

## 2020-06-22 LAB — FERRITIN: Ferritin: 450 ng/mL — ABNORMAL HIGH (ref 24–336)

## 2020-06-22 LAB — PROTIME-INR
INR: 1 (ref 0.8–1.2)
Prothrombin Time: 13.1 seconds (ref 11.4–15.2)

## 2020-06-22 LAB — TROPONIN I (HIGH SENSITIVITY)
Troponin I (High Sensitivity): 40 ng/L — ABNORMAL HIGH (ref <18)
Troponin I (High Sensitivity): 43 ng/L — ABNORMAL HIGH (ref <18)

## 2020-06-22 LAB — LACTATE DEHYDROGENASE: LDH: 271 U/L — ABNORMAL HIGH (ref 98–192)

## 2020-06-22 LAB — PROCALCITONIN: Procalcitonin: <0.1 ng/mL

## 2020-06-22 LAB — HEPATITIS B SURFACE ANTIGEN: Hepatitis B Surface Ag: NONREACTIVE

## 2020-06-22 LAB — D-DIMER, QUANTITATIVE: D-Dimer, Quant: 0.9 ug/mL-FEU — ABNORMAL HIGH (ref 0.00–0.50)

## 2020-06-22 LAB — HEMOGLOBIN A1C
Hgb A1c MFr Bld: 6.4 % — ABNORMAL HIGH (ref 4.8–5.6)
Mean Plasma Glucose: 136.98 mg/dL

## 2020-06-22 LAB — SARS CORONAVIRUS 2 BY RT PCR (HOSPITAL ORDER, PERFORMED IN ~~LOC~~ HOSPITAL LAB): SARS Coronavirus 2: POSITIVE — AB

## 2020-06-22 LAB — ETHANOL: Alcohol, Ethyl (B): <10 mg/dL (ref <10)

## 2020-06-22 MED ORDER — ONDANSETRON HCL 4 MG PO TABS: 4.0000 mg | ORAL_TABLET | Freq: Four times a day (QID) | ORAL | Status: DC | PRN

## 2020-06-22 MED ORDER — AMLODIPINE BESYLATE 5 MG PO TABS
10.0000 mg | ORAL_TABLET | Freq: Every day | ORAL | Status: DC
Start: 2020-06-22 — End: 2020-06-24
  Administered 2020-06-22 – 2020-06-23 (×2): 10 mg via ORAL
  Filled 2020-06-22 (×2): qty 2

## 2020-06-22 MED ORDER — FLUMAZENIL 0.5 MG/5ML IV SOLN
0.2000 mg | Freq: Once | INTRAVENOUS | Status: AC
  Administered 2020-06-22: 0.2 mg via INTRAVENOUS

## 2020-06-22 MED ORDER — SODIUM CHLORIDE 0.9 % IV SOLN: 100.0000 mg | Freq: Every day | INTRAVENOUS | Status: DC

## 2020-06-22 MED ORDER — LORAZEPAM 1 MG PO TABS: 0.0000 mg | ORAL_TABLET | Freq: Four times a day (QID) | ORAL | Status: DC

## 2020-06-22 MED ORDER — SODIUM CHLORIDE 0.9 % IV SOLN: 200.0000 mg | Freq: Once | INTRAVENOUS | Status: DC

## 2020-06-22 MED ORDER — ASCORBIC ACID 500 MG PO TABS
500.0000 mg | ORAL_TABLET | Freq: Every day | ORAL | Status: DC
  Administered 2020-06-22 – 2020-06-23 (×2): 500 mg via ORAL
  Filled 2020-06-22 (×2): qty 1

## 2020-06-22 MED ORDER — NALOXONE HCL 0.4 MG/ML IJ SOLN: 0.4000 mg | Freq: Once | INTRAMUSCULAR | Status: AC

## 2020-06-22 MED ORDER — SODIUM CHLORIDE 0.9 % IV SOLN
100.0000 mg | Freq: Every day | INTRAVENOUS | Status: DC
  Administered 2020-06-23: 100 mg via INTRAVENOUS
  Filled 2020-06-22: qty 20

## 2020-06-22 MED ORDER — METHYLPREDNISOLONE SODIUM SUCC 125 MG IJ SOLR
0.5000 mg/kg | Freq: Two times a day (BID) | INTRAMUSCULAR | Status: DC
  Administered 2020-06-22 – 2020-06-23 (×3): 51.875 mg via INTRAVENOUS
  Filled 2020-06-22 (×2): qty 2

## 2020-06-22 MED ORDER — PREDNISONE 20 MG PO TABS: 50.0000 mg | ORAL_TABLET | Freq: Every day | ORAL | Status: DC

## 2020-06-22 MED ORDER — ENOXAPARIN SODIUM 60 MG/0.6ML ~~LOC~~ SOLN
50.0000 mg | SUBCUTANEOUS | Status: DC
  Administered 2020-06-22: 50 mg via SUBCUTANEOUS
  Filled 2020-06-22: qty 0.6

## 2020-06-22 MED ORDER — LEVOFLOXACIN IN D5W 750 MG/150ML IV SOLN
750.0000 mg | Freq: Once | INTRAVENOUS | Status: AC
  Administered 2020-06-22: 750 mg via INTRAVENOUS
  Filled 2020-06-22: qty 150

## 2020-06-22 MED ORDER — ACETAMINOPHEN 325 MG PO TABS
650.0000 mg | ORAL_TABLET | Freq: Once | ORAL | Status: AC
  Administered 2020-06-22: 650 mg via ORAL
  Filled 2020-06-22: qty 2

## 2020-06-22 MED ORDER — DEXAMETHASONE SODIUM PHOSPHATE 10 MG/ML IJ SOLN
10.0000 mg | Freq: Once | INTRAMUSCULAR | Status: AC
  Administered 2020-06-22: 10 mg via INTRAVENOUS
  Filled 2020-06-22: qty 1

## 2020-06-22 MED ORDER — LACTATED RINGERS IV SOLN: INTRAVENOUS | Status: AC

## 2020-06-22 MED ORDER — THIAMINE HCL 100 MG PO TABS
100.0000 mg | ORAL_TABLET | Freq: Every day | ORAL | Status: DC
  Administered 2020-06-22 – 2020-06-23 (×2): 100 mg via ORAL
  Filled 2020-06-22 (×2): qty 1

## 2020-06-22 MED ORDER — LORAZEPAM 1 MG PO TABS: 1.0000 mg | ORAL_TABLET | ORAL | Status: DC | PRN

## 2020-06-22 MED ORDER — NALOXONE HCL 2 MG/2ML IJ SOSY
PREFILLED_SYRINGE | INTRAMUSCULAR | Status: AC
  Filled 2020-06-22: qty 2

## 2020-06-22 MED ORDER — NALOXONE HCL 0.4 MG/ML IJ SOLN
INTRAMUSCULAR | Status: AC
  Administered 2020-06-22: 0.4 mg via INTRAMUSCULAR
  Filled 2020-06-22: qty 1

## 2020-06-22 MED ORDER — FOLIC ACID 1 MG PO TABS
1.0000 mg | ORAL_TABLET | Freq: Every day | ORAL | Status: DC
  Administered 2020-06-22 – 2020-06-23 (×2): 1 mg via ORAL
  Filled 2020-06-22 (×2): qty 1

## 2020-06-22 MED ORDER — PANTOPRAZOLE SODIUM 40 MG PO TBEC
40.0000 mg | DELAYED_RELEASE_TABLET | Freq: Every day | ORAL | Status: DC
  Administered 2020-06-22 – 2020-06-23 (×2): 40 mg via ORAL
  Filled 2020-06-22 (×2): qty 1

## 2020-06-22 MED ORDER — CHLORHEXIDINE GLUCONATE CLOTH 2 % EX PADS
6.0000 | MEDICATED_PAD | Freq: Every day | CUTANEOUS | Status: DC
  Administered 2020-06-23: 6 via TOPICAL

## 2020-06-22 MED ORDER — ZINC SULFATE 220 (50 ZN) MG PO CAPS
220.0000 mg | ORAL_CAPSULE | Freq: Every day | ORAL | Status: DC
  Administered 2020-06-22 – 2020-06-23 (×2): 220 mg via ORAL
  Filled 2020-06-22 (×2): qty 1

## 2020-06-22 MED ORDER — NALOXONE HCL 2 MG/2ML IJ SOSY
1.0000 mg | PREFILLED_SYRINGE | INTRAMUSCULAR | Status: DC | PRN
  Administered 2020-06-22: 1 mg via INTRAVENOUS

## 2020-06-22 MED ORDER — ONDANSETRON HCL 4 MG/2ML IJ SOLN: 4.0000 mg | Freq: Four times a day (QID) | INTRAMUSCULAR | Status: DC | PRN

## 2020-06-22 MED ORDER — SODIUM CHLORIDE 0.9 % IV SOLN
100.0000 mg | INTRAVENOUS | Status: AC
  Administered 2020-06-22 (×2): 100 mg via INTRAVENOUS
  Filled 2020-06-22 (×2): qty 20

## 2020-06-22 MED ORDER — LORAZEPAM 1 MG PO TABS: 0.0000 mg | ORAL_TABLET | Freq: Two times a day (BID) | ORAL | Status: DC

## 2020-06-22 MED ORDER — ACETAMINOPHEN 325 MG PO TABS: 650.0000 mg | ORAL_TABLET | Freq: Four times a day (QID) | ORAL | Status: DC | PRN

## 2020-06-22 MED ORDER — LORAZEPAM 2 MG/ML IJ SOLN
1.0000 mg | INTRAMUSCULAR | Status: DC | PRN
  Administered 2020-06-22: 2 mg via INTRAVENOUS
  Filled 2020-06-22: qty 1

## 2020-06-22 MED ORDER — NICOTINE 21 MG/24HR TD PT24: 21.0000 mg | MEDICATED_PATCH | Freq: Every day | TRANSDERMAL | Status: DC

## 2020-06-22 MED ORDER — HYDROCOD POLST-CPM POLST ER 10-8 MG/5ML PO SUER: 5.0000 mL | Freq: Two times a day (BID) | ORAL | Status: DC | PRN

## 2020-06-22 MED ORDER — IPRATROPIUM-ALBUTEROL 20-100 MCG/ACT IN AERS
1.0000 | INHALATION_SPRAY | Freq: Four times a day (QID) | RESPIRATORY_TRACT | Status: DC
  Administered 2020-06-22 – 2020-06-23 (×4): 1 via RESPIRATORY_TRACT
  Filled 2020-06-22: qty 4

## 2020-06-22 MED ORDER — ALBUTEROL SULFATE HFA 108 (90 BASE) MCG/ACT IN AERS
2.0000 | INHALATION_SPRAY | Freq: Once | RESPIRATORY_TRACT | Status: AC
  Administered 2020-06-22: 2 via RESPIRATORY_TRACT
  Filled 2020-06-22: qty 6.7

## 2020-06-22 MED ORDER — FLUMAZENIL 0.5 MG/5ML IV SOLN
INTRAVENOUS | Status: AC
  Filled 2020-06-22: qty 5

## 2020-06-22 MED ORDER — GUAIFENESIN-DM 100-10 MG/5ML PO SYRP: 10.0000 mL | ORAL_SOLUTION | ORAL | Status: DC | PRN

## 2020-06-22 MED ORDER — LACTATED RINGERS IV BOLUS
500.0000 mL | Freq: Once | INTRAVENOUS | Status: AC
  Administered 2020-06-22: 500 mL via INTRAVENOUS

## 2020-06-22 NOTE — ED Triage Notes (Signed)
Pt presents to ED via [facility] for increased SOB x couple days. Sat on arrival of EMS 82% on RA. Pt given 2.5 of albuterol, 0.5 mg atrovent, and solumedrol 125 mg PTA, sats increased to 90% on RA.

## 2020-06-22 NOTE — ED Notes (Signed)
Narcan given to pt per [facility]. Pt responds minimally. Still able to arouse with sternal rub. Pt awakes for a few moments then goes back to sleep. MD is aware of pts status. VSS.

## 2020-06-22 NOTE — ED Notes (Signed)
Date and time results received: 06/22/20 1811 (use smartphrase ".now" to insert current time)  Test: ABG Critical Value: PCO2 101 PH 7.177  Name of Provider Notified: [place]   Orders Received? Or Actions Taken?:

## 2020-06-22 NOTE — ED Notes (Signed)
Entered pts room to find him unresponsive, drooling and very diaphoretic. Pt is cool and clammy to touch. Pt responded to sternal rub but is easy to fall back asleep. EDP and attending provider made aware. Dr. Dyann Kief responded to patients bedside. 0.4 mg of narcan ordered.

## 2020-06-22 NOTE — ED Notes (Signed)
Date and time results received: 06/22/20 1120 (use smartphrase ".now" to insert current time)  Test: covid Critical Value: positive   Name of Provider Notified: Evalee Jefferson PA  Orders Received? Or Actions Taken?:

## 2020-06-22 NOTE — ED Notes (Signed)
Narcan given. Pt responded minimally then went back unresponsive. Pt rouses to deep sternal rub.

## 2020-06-22 NOTE — ED Notes (Signed)
Pt sitting up in bed more alert at this time. MD and respiratory at bedside. Pt being placed on BIpap at this time.

## 2020-06-22 NOTE — Sepsis Progress Note (Signed)
Confirmed through secure chat with bedside RN Emilee that blood cultures were definitely drawn prior to initiation of antibiotics.

## 2020-06-22 NOTE — Sepsis Progress Note (Addendum)
eLink is monitoring this Code Sepsis. °

## 2020-06-22 NOTE — ED Notes (Signed)
PT placed on 15L NRB d/t O2 sats remaining 86% on 4L. Pt is mouth breathing.
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
## 2020-06-22 NOTE — ED Notes (Signed)
Ambulated pt in room. pts sats dropped to 86% on RA. Pt sats now 93% on 3L.

## 2020-06-22 NOTE — ED Notes (Signed)
Date and time results received: 06/22/20 9:13 PM  Test: CO2 Critical Value: 61  Name of Provider Notified: Dr. Dyann Kief  Orders Received? Or Actions Taken?: Awaiting orders

## 2020-06-22 NOTE — ED Notes (Signed)
Respiratory called for ABG stat.
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
## 2020-06-22 NOTE — ED Notes (Signed)
Pt removed all cardiac monitor leads, oxygen, bp cuff and o2 sensor. Reoriented pt back to bed and reattached monitor leads, etc. Pt now resting in bed. Ativan given per [facility] d/t CIWA score of 15.

## 2020-06-22 NOTE — ED Notes (Signed)
MD made aware of response to narcan. additional order given for 1mg  of narcan IV.

## 2020-06-22 NOTE — ED Notes (Signed)
Pt hard to arouse. MD made aware.

## 2020-06-22 NOTE — ED Notes (Signed)
pts mother updated

## 2020-06-23 DIAGNOSIS — J9621 Acute and chronic respiratory failure with hypoxia: Secondary | ICD-10-CM | POA: Diagnosis not present

## 2020-06-23 DIAGNOSIS — J9622 Acute and chronic respiratory failure with hypercapnia: Secondary | ICD-10-CM

## 2020-06-23 DIAGNOSIS — F10230 Alcohol dependence with withdrawal, uncomplicated: Secondary | ICD-10-CM | POA: Diagnosis not present

## 2020-06-23 DIAGNOSIS — A419 Sepsis, unspecified organism: Secondary | ICD-10-CM | POA: Diagnosis not present

## 2020-06-23 DIAGNOSIS — F1410 Cocaine abuse, uncomplicated: Secondary | ICD-10-CM

## 2020-06-23 DIAGNOSIS — U071 COVID-19: Secondary | ICD-10-CM | POA: Diagnosis not present

## 2020-06-23 DIAGNOSIS — J449 Chronic obstructive pulmonary disease, unspecified: Secondary | ICD-10-CM

## 2020-06-23 DIAGNOSIS — J9601 Acute respiratory failure with hypoxia: Secondary | ICD-10-CM | POA: Diagnosis not present

## 2020-06-23 LAB — CBC WITH DIFFERENTIAL/PLATELET
Abs Immature Granulocytes: 0.04 10*3/uL (ref 0.00–0.07)
Basophils Absolute: 0 10*3/uL (ref 0.0–0.1)
Basophils Relative: 0 %
Eosinophils Absolute: 0 10*3/uL (ref 0.0–0.5)
Eosinophils Relative: 0 %
HCT: 46.1 % (ref 39.0–52.0)
Hemoglobin: 15.2 g/dL (ref 13.0–17.0)
Immature Granulocytes: 1 %
Lymphocytes Relative: 9 %
Lymphs Abs: 0.6 10*3/uL — ABNORMAL LOW (ref 0.7–4.0)
MCH: 36.1 pg — ABNORMAL HIGH (ref 26.0–34.0)
MCHC: 33 g/dL (ref 30.0–36.0)
MCV: 109.5 fL — ABNORMAL HIGH (ref 80.0–100.0)
Monocytes Absolute: 0.3 10*3/uL (ref 0.1–1.0)
Monocytes Relative: 5 %
Neutro Abs: 5.8 10*3/uL (ref 1.7–7.7)
Neutrophils Relative %: 85 %
Platelets: 154 10*3/uL (ref 150–400)
RBC: 4.21 MIL/uL — ABNORMAL LOW (ref 4.22–5.81)
RDW: 13.6 % (ref 11.5–15.5)
WBC: 6.8 10*3/uL (ref 4.0–10.5)
nRBC: 0 % (ref 0.0–0.2)

## 2020-06-23 LAB — COMPREHENSIVE METABOLIC PANEL
ALT: 53 U/L — ABNORMAL HIGH (ref 0–44)
AST: 36 U/L (ref 15–41)
Albumin: 2.8 g/dL — ABNORMAL LOW (ref 3.5–5.0)
Alkaline Phosphatase: 46 U/L (ref 38–126)
Anion gap: 6 (ref 5–15)
BUN: 18 mg/dL (ref 6–20)
CO2: 33 mmol/L — ABNORMAL HIGH (ref 22–32)
Calcium: 8.8 mg/dL — ABNORMAL LOW (ref 8.9–10.3)
Chloride: 95 mmol/L — ABNORMAL LOW (ref 98–111)
Creatinine, Ser: 0.82 mg/dL (ref 0.61–1.24)
GFR, Estimated: >60 mL/min (ref >60)
Glucose, Bld: 175 mg/dL — ABNORMAL HIGH (ref 70–99)
Potassium: 5.5 mmol/L — ABNORMAL HIGH (ref 3.5–5.1)
Sodium: 134 mmol/L — ABNORMAL LOW (ref 135–145)
Total Bilirubin: 0.7 mg/dL (ref 0.3–1.2)
Total Protein: 7.6 g/dL (ref 6.5–8.1)

## 2020-06-23 LAB — BLOOD GAS, ARTERIAL
Acid-Base Excess: 7.8 mmol/L — ABNORMAL HIGH (ref 0.0–2.0)
Bicarbonate: 28.8 mmol/L — ABNORMAL HIGH (ref 20.0–28.0)
Delivery systems: POSITIVE
Drawn by: 21310
Expiratory PAP: 5
FIO2: 30
Inspiratory PAP: 14
O2 Saturation: 92.8 %
Patient temperature: 36.3
RATE: 12 resp/min
pCO2 arterial: 69.3 mmHg (ref 32.0–48.0)
pH, Arterial: 7.308 — ABNORMAL LOW (ref 7.350–7.450)
pO2, Arterial: 69.4 mmHg — ABNORMAL LOW (ref 83.0–108.0)

## 2020-06-23 LAB — URINE CULTURE: Culture: NO GROWTH

## 2020-06-23 LAB — MRSA PCR SCREENING: MRSA by PCR: NEGATIVE

## 2020-06-23 LAB — FERRITIN: Ferritin: 412 ng/mL — ABNORMAL HIGH (ref 24–336)

## 2020-06-23 LAB — D-DIMER, QUANTITATIVE: D-Dimer, Quant: 0.71 ug/mL-FEU — ABNORMAL HIGH (ref 0.00–0.50)

## 2020-06-23 LAB — MAGNESIUM: Magnesium: 2 mg/dL (ref 1.7–2.4)

## 2020-06-23 LAB — C-REACTIVE PROTEIN: CRP: 7.9 mg/dL — ABNORMAL HIGH (ref <1.0)

## 2020-06-23 LAB — PHOSPHORUS: Phosphorus: 4.5 mg/dL (ref 2.5–4.6)

## 2020-06-23 MED ORDER — SODIUM ZIRCONIUM CYCLOSILICATE 5 G PO PACK
5.0000 g | PACK | Freq: Two times a day (BID) | ORAL | Status: DC
  Administered 2020-06-23: 5 g via ORAL
  Filled 2020-06-23: qty 1

## 2020-06-23 MED ORDER — CHLORHEXIDINE GLUCONATE 0.12 % MT SOLN
15.0000 mL | Freq: Two times a day (BID) | OROMUCOSAL | Status: DC
  Administered 2020-06-23: 15 mL via OROMUCOSAL
  Filled 2020-06-23: qty 15

## 2020-06-23 MED ORDER — INSULIN ASPART 100 UNIT/ML ~~LOC~~ SOLN
0.0000 [IU] | Freq: Three times a day (TID) | SUBCUTANEOUS | Status: DC
  Administered 2020-06-23: 9 [IU] via SUBCUTANEOUS

## 2020-06-23 MED ORDER — INSULIN ASPART 100 UNIT/ML ~~LOC~~ SOLN: 0.0000 [IU] | Freq: Every day | SUBCUTANEOUS | Status: DC

## 2020-06-23 MED ORDER — ORAL CARE MOUTH RINSE
15.0000 mL | Freq: Two times a day (BID) | OROMUCOSAL | Status: DC
  Administered 2020-06-23: 15 mL via OROMUCOSAL

## 2020-06-23 MED ORDER — SODIUM BICARBONATE 8.4 % IV SOLN
50.0000 meq | Freq: Once | INTRAVENOUS | Status: AC
  Administered 2020-06-23: 50 meq via INTRAVENOUS

## 2020-06-23 MED ORDER — CALCIUM GLUCONATE-NACL 1-0.675 GM/50ML-% IV SOLN
1.0000 g | Freq: Once | INTRAVENOUS | Status: AC
  Administered 2020-06-23: 1000 mg via INTRAVENOUS
  Filled 2020-06-23: qty 50

## 2020-06-23 NOTE — Progress Notes (Signed)
Inpatient Diabetes Program Recommendations  AACE/ADA: New Consensus Statement on Inpatient Glycemic Control   Target Ranges:  Prepandial:   less than 140 mg/dL      Peak postprandial:   less than 180 mg/dL (1-2 hours)      Critically ill patients:  140 - 180 mg/dL  Results for [name], [name] (MRN [id number]) as of 06/23/2020 08:51  Ref. Range 06/22/2020 09:42 06/23/2020 06:20  Glucose Latest Ref Range: 70 - 99 mg/dL 145 (H) 175 (H)   Results for [name], [name] (MRN [id number]) as of 06/23/2020 08:51  Ref. Range 06/22/2020 16:44  Glucose-Capillary Latest Ref Range: 70 - 99 mg/dL 376 (H)  Results for [name], [name] (MRN [id number]) as of 06/23/2020 08:51  Ref. Range 10/20/2019 04:34 06/22/2020 12:21  Hemoglobin A1C Latest Ref Range: 4.8 - 5.6 % 6.4 (H) 6.4 (H)    Review of Glycemic Control  Diabetes history: Pre-DM Outpatient Diabetes medications: None Current orders for Inpatient glycemic control: None  Inpatient Diabetes Program Recommendations:    Insulin: Please consider ordering CBGs AC&HS, Novolog 0-15 units TID with meals, Novolog 0-5 units QHS, and if steroids are continued please order Novolog 3 units TID with meals for meal coverage if patient eats at least 50% of meals..  HbgA1C: A1C 6.4% on 06/22/20 indicating an average glucose of 137 mg/dl over the past 2-3 months.   NOTE: In reviewing chart, noted patient was inpatient 10/19/19-10/21/19 and inpatient diabetes spoke with patient on 10/21/19 regarding prediabetes and A1C of 6.4% on 10/20/19. Current A1C is 6.4% on 06/22/20. Patient admitted 06/22/20 and initial glucose 145 mg/dl. Patient is currently ordered steroids which are contributing to hyperglycemia. Recommend ordering CBGs and Novolog correction scale and meal coverage if steroids are continued.  Thanks, Barnie Alderman, RN, MSN, CDE Diabetes Coordinator Inpatient Diabetes Program [phone] (Team Pager from 8am to 5pm)

## 2020-06-23 NOTE — Progress Notes (Signed)
PROGRESS NOTE    [name]  PJA:[id number] DOB: [birth year] DOA: 06/22/2020 PCP: [name], NP   Chief Complaint  Patient presents with  . Shortness of Breath    Brief admission Narrative:  [name] is a 55 y.o. male with medical history significant of alcohol abuse, COPD, chronic diastolic HF, hypertension, cocaine abuse, hx of tobacco abuse (reported that he has quit smoking), class II obesity and chronic back pain; who presented to the hospital secondary to shortness of breath, productive cough and chills.  Symptoms have been present for the last 3 to 4 days and worsening.  He expressed son pleuritic external discomfort when coughing.  No palpitations, no nausea, no vomiting, no abdominal pain, no dysuria, no hematuria, no melena, no hematochezia, no diarrhea, no focal neurologic deficit. Of note, patient reports last drink was about 3 hours ago.  On presentation to ED experiencing diaphoresis, febrile, with significant tachypnea and found to be positive for COVID.  Patient met sepsis criteria.  ED Course: Chest x-ray demonstrating bilateral infiltrates, normal lactic acid.  Patient received empirical coverage with IV antibiotics initially suspecting bacterial component as he met criteria for sepsis.  Procalcitonin found not to be elevated, with positive Covid test patient received steroids, oxygen supplementation and he was in the mid 80s on room air and started on remdesivir.  TRH was contacted to place in the hospital for further evaluation and management.  Of note, patient is not vaccinated.  Patient ended experiencing somnolence/lethargy after Ativan given for elevated CIWA score; ABG demonstrating hypercapnia component and need for BiPAP.  Assessment & Plan: 1-acute respiratory failure with hypoxia and hypercapnia in the setting of COVID-19 pneumonia -Most likely will be undiagnosed obesity hypoventilation syndrome and obstructive sleep apnea. -Continue to wean off  BiPAP and oxygen supplementation as tolerated -Continue treatment with steroids, remdesivir and as needed bronchodilators. -Repeat ABG in a.m. -Patient encouraged to use flutter valve, incentive spirometer, proning,  turning position and increase activity. -Continue vitamin C and zinc. -follow inflammatory markers.  2-alcohol abuse/withdrawal -Continue to follow CIWA score -Continue the use of as needed Ativan -continue folic acid and thiamine -Alcohol cessation provided  3-cocaine abuse -Cessation counseling provided  4-GERD/GI prophylaxis Significantly -Continue PPI.  5-HTN -continue antihypertensive agents.  6-hyperglycemia -steroids induced -A1C 6.4 -positive prediabetes  7-macrocytosis -follow B12 level -most likely secondary to alcohol abuse  8-transaminiitis -from alcohol and covid -continue follow LFT's; trending down.  9-sepsis due to COVID-PNA -met criteria on admission -continue management and follow sepsis features -no fever currently  DVT prophylaxis: Lovenox. Code Status: Full code. Family Communication: No family at bedside. Disposition:   Status is: Inpatient  Dispo: The patient is from: home              Anticipated d/c is to: home               Anticipated d/c date is: 3 days              Patient currently not medically stable for discharge; still with significant ongoing respiratory distress.  Continue acute treatment with steroids, remdesivir, steroids and wean off oxygen supplementation as tolerated.  Will also continue treatment for alcohol withdrawal and follow clinical response.      Consultants:   None    Procedures:  See below for x-ray reports.    Antimicrobials/antiviral remdesivir   Subjective: Denies chest pain, no nausea, no vomiting.  Reported to be significantly hungry and would like to have BiPAP removed.  Following commands appropriately good saturation and no major complaints. Afebrile currently.    Objective: Vitals:   06/23/20 1200 06/23/20 1300 06/23/20 1311 06/23/20 1400  BP:      Pulse: (!) 106 (!) 121  73  Resp: (!) 30 (!) 33  (!) 34  Temp: 97.88 F (36.6 C) 98.6 F (37 C) 98.2 F (36.8 C) 98.42 F (36.9 C)  TempSrc:   Oral   SpO2: 100% 92% 96% 94%  Weight:      Height:        Intake/Output Summary (Last 24 hours) at 06/23/2020 1624 Last data filed at 06/23/2020 0500 Gross per 24 hour  Intake 1787.5 ml  Output 800 ml  Net 987.5 ml   Filed Weights   06/22/20 0902 06/22/20 2300  Weight: 104.3 kg 93.9 kg    Examination:  General exam: Appears in no distress; denies chest pain, no nausea or vomiting.  Stressed to be very hungry and will like to be taken off BiPAP.  Currently afebrile.  No chest pain, no nausea, no vomiting. Respiratory system: Mild expiratory wheezing; positive bilateral rhonchi's.  Positive tachypnea with activity.  Good saturation while receiving BiPAP. Cardiovascular system: S1 & S2 heard, sinus tachycardia, No murmurs, rubs or gallops. Unable to assess JVD with body habitus.  Gastrointestinal system: Abdomen is obese, nondistended, soft and nontender. No organomegaly or masses felt. Normal bowel sounds heard. Central nervous system: Alert and oriented. No focal neurological deficits. Extremities: no cyanosis, no clubbing Skin: No rashes, no petechiae.  Psychiatry: Mood & affect appropriate.     Data Reviewed: I have personally reviewed following labs and imaging studies  CBC: Recent Labs  Lab 06/22/20 0942 06/22/20 1243 06/23/20 0620  WBC 6.8 7.2 6.8  NEUTROABS 5.0  --  5.8  HGB 16.2 16.5 15.2  HCT 47.2 47.5 46.1  MCV 104.2* 105.1* 109.5*  PLT 151 153 193    Basic Metabolic Panel: Recent Labs  Lab 06/22/20 0942 06/22/20 1243 06/23/20 0620  NA 127*  --  134*  K 3.7  --  5.5*  CL 90*  --  95*  CO2 29  --  33*  GLUCOSE 145*  --  175*  BUN 12  --  18  CREATININE 0.67 0.68 0.82  CALCIUM 8.4*  --  8.8*  MG  --   --  2.0   PHOS  --   --  4.5    GFR: Estimated Creatinine Clearance: 111.1 mL/min (by C-G formula based on SCr of 0.82 mg/dL).  Liver Function Tests: Recent Labs  Lab 06/22/20 7902 06/23/20 0620  AST 68* 36  ALT 62* 53*  ALKPHOS 57 46  BILITOT 1.0 0.7  PROT 8.4* 7.6  ALBUMIN 3.3* 2.8*    CBG: Recent Labs  Lab 06/22/20 1644  GLUCAP 376*     Recent Results (from the past 240 hour(s))  SARS Coronavirus 2 by RT PCR (hospital order, performed in [facility] hospital lab) Nasopharyngeal Nasopharyngeal Swab     Status: Abnormal   Collection Time: 06/22/20  9:19 AM   Specimen: Nasopharyngeal Swab  Result Value Ref Range Status   SARS Coronavirus 2 POSITIVE (A) NEGATIVE Final    Comment: RESULT CALLED TO, READ BACK BY AND VERIFIED WITH: EMILY GANT $RemoveBe'@[id number]$  06/22/20 BY JONES,T (NOTE) SARS-CoV-2 target nucleic acids are DETECTED  SARS-CoV-2 RNA is generally detectable in upper respiratory specimens  during the acute phase of infection.  Positive results are indicative  of the presence of the identified  virus, but do not rule out bacterial infection or co-infection with other pathogens not detected by the test.  Clinical correlation with patient history and  other diagnostic information is necessary to determine patient infection status.  The expected result is negative.  Fact Sheet for Patients:   StrictlyIdeas.no   Fact Sheet for Healthcare Providers:   BankingDealers.co.za    This test is not yet approved or cleared by the [place] FDA and  has been authorized for detection and/or diagnosis of SARS-CoV-2 by FDA under an Emergency Use Authorization (EUA).  This EUA will remain in effect (meaning this te st can be used) for the duration of  the COVID-19 declaration under Section 564(b)(1) of the Act, 21 U.S.C. section 360-bbb-3(b)(1), unless the authorization is terminated or revoked sooner.  Performed at [facility], [address], [place], [place] [address]   Blood Culture (routine x 2)     Status: None (Preliminary result)   Collection Time: 06/22/20  9:42 AM   Specimen: BLOOD  Result Value Ref Range Status   Specimen Description BLOOD RIGHT ARM  Final   Special Requests   Final    BOTTLES DRAWN AEROBIC AND ANAEROBIC Blood Culture adequate volume   Culture   Final    NO GROWTH 1 DAY Performed at [facility], [address], [place], [place] [address]    Report Status PENDING  Incomplete  Blood Culture (routine x 2)     Status: None (Preliminary result)   Collection Time: 06/22/20  9:43 AM   Specimen: BLOOD  Result Value Ref Range Status   Specimen Description BLOOD LEFT ARM  Final   Special Requests   Final    BOTTLES DRAWN AEROBIC AND ANAEROBIC Blood Culture adequate volume   Culture   Final    NO GROWTH 1 DAY Performed at [facility], [address], [place], [place] [address]    Report Status PENDING  Incomplete  Urine culture     Status: None   Collection Time: 06/22/20 11:23 AM   Specimen: Urine, Catheterized  Result Value Ref Range Status   Specimen Description   Final    URINE, CATHETERIZED Performed at [facility], [address] [place], [place] [address]    Special Requests   Final    NONE Performed at [facility], [address], [place], [place] [address]    Culture   Final    NO GROWTH Performed at [facility], [place] [address], [place], [place] [address]    Report Status 06/23/2020 FINAL  Final  MRSA PCR Screening     Status: None   Collection Time: 06/22/20 10:47 PM   Specimen: Nasal Mucosa; Nasopharyngeal  Result Value Ref Range Status   MRSA by PCR NEGATIVE NEGATIVE Final    Comment:        The GeneXpert MRSA Assay (FDA approved for NASAL specimens only), is one component of a comprehensive MRSA colonization surveillance program. It is not intended to diagnose MRSA infection nor to guide or monitor treatment for MRSA infections. Performed at [facility], [address] [place], [place] [address]     Radiology Studies: [facility] 1 View  Result Date: 06/22/2020 CLINICAL DATA:  Questionable sepsis. EXAM: PORTABLE CHEST 1 VIEW COMPARISON:  05/15/2020. FINDINGS: Stable cardiomegaly. No pulmonary venous congestion. Diffuse bilateral pulmonary interstitial infiltrates. Findings consistent with pneumonitis. Question patient's COVID status. No pleural effusion or pneumothorax. IMPRESSION: 1. Stable cardiomegaly.  No pulmonary venous congestion. 2.  Diffuse bilateral pulmonary interstitial infiltrates consistent with pneumonitis. Question patient's COVID status. Electronically Signed   By: Marcello Moores  Register   On: 06/22/2020 09:48   Scheduled Meds: . amLODipine  10 mg Oral Daily  . vitamin C  500 mg Oral Daily  . chlorhexidine  15 mL Mouth Rinse BID  . Chlorhexidine Gluconate Cloth  6 each Topical Daily  . enoxaparin (LOVENOX) injection  50 mg Subcutaneous Q24H  . folic acid  1 mg Oral Daily  . insulin aspart  0-5 Units Subcutaneous QHS  . [START ON 06/24/2020] insulin aspart  0-9 Units Subcutaneous TID WC  . Ipratropium-Albuterol  1 puff Inhalation Q6H  . mouth rinse  15 mL Mouth Rinse q12n4p  . methylPREDNISolone (SOLU-MEDROL) injection  0.5 mg/kg Intravenous Q12H   Followed by  . [START ON 06/25/2020] predniSONE  50 mg Oral Daily  . pantoprazole  40 mg Oral Daily  . sodium zirconium cyclosilicate  5 g Oral BID  . thiamine  100 mg Oral Daily  . zinc sulfate  220 mg Oral Daily   Continuous Infusions: . remdesivir 100 mg in NS 100 mL 100 mg (06/23/20 1038)     LOS: 1 day    Time spent: 35 minutes  Barton Dubois, MD [facility]   To contact the attending provider between 7A-7P or the covering provider during after hours 7P-7A, please log into the web site [email or web address] and access using universal [place] password for that web site. If you do not have the password, please call the hospital operator.  06/23/2020, 4:24 PM

## 2020-06-23 NOTE — Progress Notes (Signed)
Called by RN about patient decision to go home AMA. I have long discussion with patient about his condition, the need for further treatment and monitoring to guaranteed safety and the risk he faces if leaving against medical advice, including death. He expressed never feeling better, demonstrated good capacity and understanding of his his situation and decisions, oriented X4 and adamant that he is leaving. He expressed that he will like a klonopin prescription to use after discharge, as he suffers from anxiety; currently no PCP and has not received prescriptions for that medication in the past; I explained concerns for safety while using benzodiazepines without supervision or close monitoring, especially after resp failure with hypercapnia at time of admission. Patient thank [place] for our care and expressed that he is ready to go home no matter what.   Vital signs: RR: 35-40 with exertion; appropriate BP: 135/78; HR: 96; O2 saturation 95-98 on 4L [place] supplementation.   Barton Dubois MD [phone]

## 2020-06-23 NOTE — Progress Notes (Signed)
Pt is adamant that he is leaving now. I educated the patient that when he is ready from a medical standpoint, he will be discharged by the doctor. Pt states, "I feel better now, I look better too. Tell the doctor thank you but I am leaving now." Patient is oriented x4 (self, place, time, situation), is fully aware why he is admitted in the hospital and understands that he will be leaving against medical advice Dr. Dyann Kief has been made aware, coming to the bedside shortly.

## 2020-06-24 DIAGNOSIS — Z03818 Encounter for observation for suspected exposure to other biological agents ruled out: Secondary | ICD-10-CM | POA: Diagnosis not present

## 2020-06-24 NOTE — Discharge Summary (Signed)
Physician Discharge Summary  [name] UXL:[id number] DOB: 1966-04-04 DOA: 06/22/2020  PCP: Randall Perna, NP  Admit date: 06/22/2020 Discharge date: 06/24/2020  Time spent: 25 minutes  Recommendations for Outpatient Follow-up:  Patient left AMA  Discharge Diagnoses:  Active Problems:   Pneumonia due to COVID-19 virus   Acute respiratory failure with hypoxia [facility])   Discharge Condition: patient left [facility]  Filed Weights   06/22/20 0902 06/22/20 2300  Weight: 104.3 kg 93.9 kg    History of present illness:  Randall Johnsonis a 55 y.o.malewith medical history significant ofalcohol abuse, COPD,chronic diastolic HF,hypertension, cocaine abuse, hx oftobacco abuse(reported that he has quit smoking), class II obesity and chronic back pain; who presented to the hospital secondary to shortness of breath, productive cough and chills. Symptoms have been present for the last 3 to 4 days and worsening. He expressed son pleuritic external discomfort when coughing. No palpitations, no nausea, no vomiting, no abdominal pain, no dysuria, no hematuria, no melena, no hematochezia, no diarrhea, no focal neurologic deficit. Of note, patient reports last drink was about 3 hours ago. On presentation to ED experiencing diaphoresis, febrile, with significant tachypnea and found to be positive for COVID. Patient met sepsis criteria.  ED Course:Chest x-ray demonstrating bilateral infiltrates, normal lactic acid. Patient received empirical coverage with IV antibiotics initially suspecting bacterial component as he metcriteria for sepsis. Procalcitonin found not to be elevated, with positive Covid test patient received steroids, oxygen supplementation and he was in the mid 80s on room air and started on remdesivir. TRH was contacted to place in the hospital for further evaluation and management. Of note,patient is notvaccinated.  Patient ended experiencing somnolence/lethargy after Ativan  given for elevated CIWA score; ABG demonstrating hypercapnia component and need for BiPAP.  Hospital Course and plan of care:  Patient was not medically ready for discharge and his transition of care felt to be unsafe. He understood the consequences of his decisions and decided to leave AMA.  1-acute respiratory failure with hypoxia and hypercapnia in the setting of COVID-19 pneumonia -Most likely will be undiagnosed obesity hypoventilation syndrome and obstructive sleep apnea. -Continue to wean off BiPAP and oxygen supplementation as tolerated -Continue treatment with steroids, remdesivir and as needed bronchodilators. -Repeat ABG in a.m. -Patient encouraged to use flutter valve, incentive spirometer, proning,  turning position and increase activity. -Continue vitamin C and zinc. -follow inflammatory markers.  2-alcohol abuse/withdrawal -Continue to follow CIWA score -Continue the use of as needed Ativan -continue folic acid and thiamine -Alcohol cessation provided  3-cocaine abuse -Cessation counseling provided  4-GERD/GI prophylaxis Significantly -Continue PPI.  5-HTN -continue antihypertensive agents.  6-hyperglycemia -steroids induced -A1C 6.4 -positive prediabetes  7-macrocytosis -follow B12 level -most likely secondary to alcohol abuse  8-transaminiitis -from alcohol and covid -continue follow LFT's; trending down.  9-sepsis due to COVID-PNA -met criteria on admission -continue management and follow sepsis features -no fever currently  Procedures: See below for x-ray reports.   Consultations:  None   Discharge Exam: Vitals:   06/23/20 1700 06/23/20 1800  BP:  (!) 156/103  Pulse: 97 93  Resp: (!) 31 20  Temp: 98.42 F (36.9 C)   SpO2: 98% 99%    General: AAOX4, having tachypnea with minimal exertion and using 3-4L [place] supplementation. Adamant of leaving AMA. No CP, no nausea, no vomiting, no fever. Patient demonstrating good  capacity. Respiratory system: Mild expiratory wheezing; positive bilateral rhonchis.  Positive tachypnea with activity.  Good saturation while receiving 3-4 L [place]. Cardiovascular  system: S1 & S2 heard, sinus tachycardia, No murmurs, rubs or gallops. Unable to assess JVD with body habitus.  Gastrointestinal system: Abdomen is obese, nondistended, soft and nontender. No organomegaly or masses felt. Normal bowel sounds heard. Central nervous system: Alert and oriented. No focal neurological deficits. Extremities: no cyanosis, no clubbing Skin: No rashes, no petechiae.  Psychiatry: Mood & affect appropriate. AAOX4.  Discharge Instructions    Allergies as of 06/23/2020      Reactions   Penicillins    From childhood: Has patient had a PCN reaction causing immediate rash, facial/tongue/throat swelling, SOB or lightheadedness with hypotension: Unknown Has patient had a PCN reaction causing severe rash involving mucus membranes or skin necrosis: Unknown Has patient had a PCN reaction that required hospitalization: Unknown Has patient had a PCN reaction occurring within the last 10 years: No If all of the above answers are "NO", then may proceed with Cephalosporin use.      Medication List    ASK your doctor about these medications   albuterol 108 (90 Base) MCG/ACT inhaler Commonly known as: VENTOLIN HFA Inhale 2 puffs into the lungs every 4 (four) hours as needed for wheezing or shortness of breath (cough).   amLODipine 10 MG tablet Commonly known as: NORVASC Take 1 tablet (10 mg total) by mouth daily.   furosemide 20 MG tablet Commonly known as: LASIX Take 1 tablet (20 mg total) by mouth 2 (two) times daily.   hydrochlorothiazide 25 MG tablet Commonly known as: HYDRODIURIL Take 1 tablet (25 mg total) by mouth daily.   lisinopril 20 MG tablet Commonly known as: ZESTRIL Take 1 tablet (20 mg total) by mouth daily.   Spiriva HandiHaler 18 MCG inhalation capsule Generic drug:  tiotropium Place 1 capsule (18 mcg total) into inhaler and inhale daily.      Allergies  Allergen Reactions  . Penicillins     From childhood: Has patient had a PCN reaction causing immediate rash, facial/tongue/throat swelling, SOB or lightheadedness with hypotension: Unknown Has patient had a PCN reaction causing severe rash involving mucus membranes or skin necrosis: Unknown Has patient had a PCN reaction that required hospitalization: Unknown Has patient had a PCN reaction occurring within the last 10 years: No If all of the above answers are "NO", then may proceed with Cephalosporin use.       The results of significant diagnostics from this hospitalization (including imaging, microbiology, ancillary and laboratory) are listed below for reference.    Significant Diagnostic Studies: DG Chest Port 1 View  Result Date: 06/22/2020 CLINICAL DATA:  Questionable sepsis. EXAM: PORTABLE CHEST 1 VIEW COMPARISON:  05/15/2020. FINDINGS: Stable cardiomegaly. No pulmonary venous congestion. Diffuse bilateral pulmonary interstitial infiltrates. Findings consistent with pneumonitis. Question patient's COVID status. No pleural effusion or pneumothorax. IMPRESSION: 1. Stable cardiomegaly.  No pulmonary venous congestion. 2. Diffuse bilateral pulmonary interstitial infiltrates consistent with pneumonitis. Question patient's COVID status. Electronically Signed   By: Marcello Moores  Register   On: 06/22/2020 09:48    Microbiology: Recent Results (from the past 240 hour(s))  SARS Coronavirus 2 by RT PCR (hospital order, performed in [facility] hospital lab) Nasopharyngeal Nasopharyngeal Swab     Status: Abnormal   Collection Time: 06/22/20  9:19 AM   Specimen: Nasopharyngeal Swab  Result Value Ref Range Status   SARS Coronavirus 2 POSITIVE (A) NEGATIVE Final    Comment: RESULT CALLED TO, READ BACK BY AND VERIFIED WITH: EMILY GANT _0  06/22/20 BY JONES,T (NOTE) SARS-CoV-2 target nucleic acids are  DETECTED  SARS-CoV-2 RNA is generally detectable in upper respiratory specimens  during the acute phase of infection.  Positive results are indicative  of the presence of the identified virus, but do not rule out bacterial infection or co-infection with other pathogens not detected by the test.  Clinical correlation with patient history and  other diagnostic information is necessary to determine patient infection status.  The expected result is negative.  Fact Sheet for Patients:   StrictlyIdeas.no   Fact Sheet for Healthcare Providers:   BankingDealers.co.za    This test is not yet approved or cleared by the [place] FDA and  has been authorized for detection and/or diagnosis of SARS-CoV-2 by FDA under an Emergency Use Authorization (EUA).  This EUA will remain in effect (meaning this te st can be used) for the duration of  the COVID-19 declaration under Section 564(b)(1) of the Act, 21 U.S.C. section 360-bbb-3(b)(1), unless the authorization is terminated or revoked sooner.  Performed at [facility], [address], [place], [place] [address]   Blood Culture (routine x 2)     Status: None (Preliminary result)   Collection Time: 06/22/20  9:42 AM   Specimen: BLOOD  Result Value Ref Range Status   Specimen Description BLOOD RIGHT ARM  Final   Special Requests   Final    BOTTLES DRAWN AEROBIC AND ANAEROBIC Blood Culture adequate volume   Culture   Final    NO GROWTH 1 DAY Performed at [facility], [address] [place], [place] [address]    Report Status PENDING  Incomplete  Blood Culture (routine x 2)     Status: None (Preliminary result)   Collection Time: 06/22/20  9:43 AM   Specimen: BLOOD  Result Value Ref Range Status   Specimen Description BLOOD LEFT ARM  Final   Special Requests   Final    BOTTLES DRAWN AEROBIC AND ANAEROBIC Blood Culture adequate volume   Culture   Final    NO GROWTH 1 DAY Performed at [facility], [address], [place], [place] [address]    Report Status PENDING  Incomplete  Urine culture     Status: None   Collection Time: 06/22/20 11:23 AM   Specimen: Urine, Catheterized  Result Value Ref Range Status   Specimen Description   Final    URINE, CATHETERIZED Performed at [facility], [address] [place], [place] [address]    Special Requests   Final    NONE Performed at [facility], [address] [place], [place] [address]    Culture   Final    NO GROWTH Performed at [facility], [place] [address] [place], [place] [address]    Report Status 06/23/2020 FINAL  Final  MRSA PCR Screening     Status: None   Collection Time: 06/22/20 10:47 PM   Specimen: Nasal Mucosa; Nasopharyngeal  Result Value Ref Range Status   MRSA by PCR NEGATIVE NEGATIVE Final    Comment:        The GeneXpert MRSA Assay (FDA approved for NASAL specimens only), is one component of a comprehensive MRSA colonization surveillance program. It is not intended to diagnose MRSA infection nor to guide or monitor treatment for MRSA infections. Performed at [facility], [address] [place],  [address]      Labs: Basic Metabolic Panel: Recent Labs  Lab 06/22/20 [phone] 06/22/20 1243 06/23/20 0620  NA 127*  --  134*  K 3.7  --  5.5*  CL 90*  --  95*  CO2 29  --  33*  GLUCOSE 145*  --  175*  BUN 12  --  18  CREATININE 0.67 0.68 0.82  CALCIUM 8.4*  --  8.8*  MG  --   --  2.0  PHOS  --   --  4.5   Liver Function Tests: Recent Labs  Lab 06/22/20 0942 06/23/20 0620  AST 68* 36  ALT 62* 53*  ALKPHOS 57 46  BILITOT 1.0 0.7  PROT 8.4* 7.6  ALBUMIN 3.3* 2.8*   CBC: Recent Labs  Lab 06/22/20 0942 06/22/20 1243 06/23/20 0620  WBC 6.8 7.2 6.8  NEUTROABS 5.0  --  5.8  HGB 16.2 16.5 15.2  HCT 47.2 47.5 46.1  MCV 104.2* 105.1* 109.5*  PLT 151 153 154   BNP (last 3 results) Recent Labs    11/30/19 0653 03/13/20 0946 03/15/20 0922  BNP 187.4* 275.5* 189.0*    CBG: Recent Labs  Lab 06/22/20 1644  GLUCAP 376*   Signed:  Barton Dubois MD.  [facility] 06/24/2020, 8:03 AM

## 2020-06-25 ENCOUNTER — Other Ambulatory Visit: Payer: Self-pay

## 2020-06-25 ENCOUNTER — Inpatient Hospital Stay (HOSPITAL_COMMUNITY)
Admission: EM | Admit: 2020-06-25 | Discharge: 2020-06-27 | DRG: 177 | Disposition: A | Discharge status: Home / Self Care | Payer: Medicaid Other | Attending: Internal Medicine | Admitting: Internal Medicine

## 2020-06-25 ENCOUNTER — Encounter (HOSPITAL_COMMUNITY): Payer: Self-pay

## 2020-06-25 ENCOUNTER — Emergency Department (HOSPITAL_COMMUNITY): Payer: Medicaid Other

## 2020-06-25 ENCOUNTER — Telehealth: Payer: Self-pay

## 2020-06-25 DIAGNOSIS — J9622 Acute and chronic respiratory failure with hypercapnia: Secondary | ICD-10-CM

## 2020-06-25 DIAGNOSIS — I110 Hypertensive heart disease with heart failure: Secondary | ICD-10-CM | POA: Diagnosis present

## 2020-06-25 DIAGNOSIS — R0602 Shortness of breath: Secondary | ICD-10-CM | POA: Diagnosis not present

## 2020-06-25 DIAGNOSIS — Y900 Blood alcohol level of less than 20 mg/100 ml: Secondary | ICD-10-CM | POA: Diagnosis present

## 2020-06-25 DIAGNOSIS — J189 Pneumonia, unspecified organism: Secondary | ICD-10-CM | POA: Diagnosis not present

## 2020-06-25 DIAGNOSIS — I10 Essential (primary) hypertension: Secondary | ICD-10-CM | POA: Diagnosis present

## 2020-06-25 DIAGNOSIS — R069 Unspecified abnormalities of breathing: Secondary | ICD-10-CM | POA: Diagnosis not present

## 2020-06-25 DIAGNOSIS — I5032 Chronic diastolic (congestive) heart failure: Secondary | ICD-10-CM

## 2020-06-25 DIAGNOSIS — Z79899 Other long term (current) drug therapy: Secondary | ICD-10-CM

## 2020-06-25 DIAGNOSIS — R0902 Hypoxemia: Secondary | ICD-10-CM | POA: Diagnosis not present

## 2020-06-25 DIAGNOSIS — F1010 Alcohol abuse, uncomplicated: Secondary | ICD-10-CM | POA: Diagnosis not present

## 2020-06-25 DIAGNOSIS — J9621 Acute and chronic respiratory failure with hypoxia: Secondary | ICD-10-CM

## 2020-06-25 DIAGNOSIS — Z823 Family history of stroke: Secondary | ICD-10-CM

## 2020-06-25 DIAGNOSIS — F410 Panic disorder [episodic paroxysmal anxiety] without agoraphobia: Secondary | ICD-10-CM | POA: Diagnosis present

## 2020-06-25 DIAGNOSIS — F17210 Nicotine dependence, cigarettes, uncomplicated: Secondary | ICD-10-CM | POA: Diagnosis present

## 2020-06-25 DIAGNOSIS — F1410 Cocaine abuse, uncomplicated: Secondary | ICD-10-CM

## 2020-06-25 DIAGNOSIS — Z880 Allergy status to penicillin: Secondary | ICD-10-CM

## 2020-06-25 DIAGNOSIS — U071 COVID-19: Secondary | ICD-10-CM | POA: Diagnosis not present

## 2020-06-25 DIAGNOSIS — Z8249 Family history of ischemic heart disease and other diseases of the circulatory system: Secondary | ICD-10-CM

## 2020-06-25 DIAGNOSIS — J1282 Pneumonia due to coronavirus disease 2019: Secondary | ICD-10-CM | POA: Diagnosis present

## 2020-06-25 DIAGNOSIS — J440 Chronic obstructive pulmonary disease with acute lower respiratory infection: Secondary | ICD-10-CM | POA: Diagnosis present

## 2020-06-25 LAB — COMPREHENSIVE METABOLIC PANEL
ALT: 63 U/L — ABNORMAL HIGH (ref 0–44)
ALT: 62 U/L — ABNORMAL HIGH (ref 0–44)
AST: 41 U/L (ref 15–41)
AST: 35 U/L (ref 15–41)
Albumin: 3.1 g/dL — ABNORMAL LOW (ref 3.5–5.0)
Albumin: 3.1 g/dL — ABNORMAL LOW (ref 3.5–5.0)
Alkaline Phosphatase: 54 U/L (ref 38–126)
Alkaline Phosphatase: 66 U/L (ref 38–126)
Anion gap: 8 (ref 5–15)
Anion gap: 10 (ref 5–15)
BUN: 16 mg/dL (ref 6–20)
BUN: 13 mg/dL (ref 6–20)
CO2: 37 mmol/L — ABNORMAL HIGH (ref 22–32)
CO2: 34 mmol/L — ABNORMAL HIGH (ref 22–32)
Calcium: 8.8 mg/dL — ABNORMAL LOW (ref 8.9–10.3)
Calcium: 8.3 mg/dL — ABNORMAL LOW (ref 8.9–10.3)
Chloride: 93 mmol/L — ABNORMAL LOW (ref 98–111)
Chloride: 91 mmol/L — ABNORMAL LOW (ref 98–111)
Creatinine, Ser: 0.71 mg/dL (ref 0.61–1.24)
Creatinine, Ser: 0.69 mg/dL (ref 0.61–1.24)
GFR, Estimated: >60 mL/min (ref >60)
GFR, Estimated: >60 mL/min (ref >60)
Glucose, Bld: 144 mg/dL — ABNORMAL HIGH (ref 70–99)
Glucose, Bld: 156 mg/dL — ABNORMAL HIGH (ref 70–99)
Potassium: 4.3 mmol/L (ref 3.5–5.1)
Potassium: 4 mmol/L (ref 3.5–5.1)
Sodium: 138 mmol/L (ref 135–145)
Sodium: 135 mmol/L (ref 135–145)
Total Bilirubin: 0.7 mg/dL (ref 0.3–1.2)
Total Bilirubin: 0.8 mg/dL (ref 0.3–1.2)
Total Protein: 7.5 g/dL (ref 6.5–8.1)
Total Protein: 7.6 g/dL (ref 6.5–8.1)

## 2020-06-25 LAB — CBC WITH DIFFERENTIAL/PLATELET
Abs Immature Granulocytes: 0.05 10*3/uL (ref 0.00–0.07)
Abs Immature Granulocytes: 0.06 10*3/uL (ref 0.00–0.07)
Basophils Absolute: 0 10*3/uL (ref 0.0–0.1)
Basophils Absolute: 0 10*3/uL (ref 0.0–0.1)
Basophils Relative: 0 %
Basophils Relative: 0 %
Eosinophils Absolute: 0 10*3/uL (ref 0.0–0.5)
Eosinophils Absolute: 0 10*3/uL (ref 0.0–0.5)
Eosinophils Relative: 0 %
Eosinophils Relative: 1 %
HCT: 46.3 % (ref 39.0–52.0)
HCT: 48.1 % (ref 39.0–52.0)
Hemoglobin: 15.5 g/dL (ref 13.0–17.0)
Hemoglobin: 16.4 g/dL (ref 13.0–17.0)
Immature Granulocytes: 1 %
Immature Granulocytes: 1 %
Lymphocytes Relative: 22 %
Lymphocytes Relative: 22 %
Lymphs Abs: 1.6 10*3/uL (ref 0.7–4.0)
Lymphs Abs: 1.4 10*3/uL (ref 0.7–4.0)
MCH: 36.5 pg — ABNORMAL HIGH (ref 26.0–34.0)
MCH: 36.6 pg — ABNORMAL HIGH (ref 26.0–34.0)
MCHC: 33.5 g/dL (ref 30.0–36.0)
MCHC: 34.1 g/dL (ref 30.0–36.0)
MCV: 108.9 fL — ABNORMAL HIGH (ref 80.0–100.0)
MCV: 107.4 fL — ABNORMAL HIGH (ref 80.0–100.0)
Monocytes Absolute: 0.6 10*3/uL (ref 0.1–1.0)
Monocytes Absolute: 0.6 10*3/uL (ref 0.1–1.0)
Monocytes Relative: 9 %
Monocytes Relative: 9 %
Neutro Abs: 4.9 10*3/uL (ref 1.7–7.7)
Neutro Abs: 4.4 10*3/uL (ref 1.7–7.7)
Neutrophils Relative %: 68 %
Neutrophils Relative %: 67 %
Platelets: 165 10*3/uL (ref 150–400)
Platelets: 177 10*3/uL (ref 150–400)
RBC: 4.25 MIL/uL (ref 4.22–5.81)
RBC: 4.48 MIL/uL (ref 4.22–5.81)
RDW: 13.3 % (ref 11.5–15.5)
RDW: 13.3 % (ref 11.5–15.5)
WBC: 7.2 10*3/uL (ref 4.0–10.5)
WBC: 6.5 10*3/uL (ref 4.0–10.5)
nRBC: 0 % (ref 0.0–0.2)
nRBC: 0 % (ref 0.0–0.2)

## 2020-06-25 LAB — RAPID URINE DRUG SCREEN, HOSP PERFORMED
Amphetamines: NOT DETECTED
Barbiturates: NOT DETECTED
Benzodiazepines: NOT DETECTED
Cocaine: POSITIVE — AB
Opiates: NOT DETECTED
Tetrahydrocannabinol: NOT DETECTED

## 2020-06-25 LAB — LIPASE, BLOOD: Lipase: 55 U/L — ABNORMAL HIGH (ref 11–51)

## 2020-06-25 LAB — BLOOD GAS, ARTERIAL
Acid-Base Excess: 13.6 mmol/L — ABNORMAL HIGH (ref 0.0–2.0)
Bicarbonate: 34.7 mmol/L — ABNORMAL HIGH (ref 20.0–28.0)
FIO2: 28
O2 Saturation: 91.9 %
Patient temperature: 37.3
pCO2 arterial: 66.7 mmHg (ref 32.0–48.0)
pH, Arterial: 7.388 (ref 7.350–7.450)
pO2, Arterial: 67.7 mmHg — ABNORMAL LOW (ref 83.0–108.0)

## 2020-06-25 LAB — PROCALCITONIN: Procalcitonin: <0.1 ng/mL

## 2020-06-25 LAB — ETHANOL: Alcohol, Ethyl (B): <10 mg/dL (ref <10)

## 2020-06-25 LAB — C-REACTIVE PROTEIN: CRP: 2 mg/dL — ABNORMAL HIGH (ref <1.0)

## 2020-06-25 LAB — FERRITIN: Ferritin: 377 ng/mL — ABNORMAL HIGH (ref 24–336)

## 2020-06-25 LAB — D-DIMER, QUANTITATIVE: D-Dimer, Quant: 1.2 ug/mL-FEU — ABNORMAL HIGH (ref 0.00–0.50)

## 2020-06-25 MED ORDER — TIOTROPIUM BROMIDE MONOHYDRATE 18 MCG IN CAPS: 18.0000 ug | ORAL_CAPSULE | Freq: Every day | RESPIRATORY_TRACT | Status: DC

## 2020-06-25 MED ORDER — LORAZEPAM 2 MG/ML IJ SOLN: 0.0000 mg | Freq: Four times a day (QID) | INTRAMUSCULAR | Status: DC

## 2020-06-25 MED ORDER — UMECLIDINIUM BROMIDE 62.5 MCG/INH IN AEPB
1.0000 | INHALATION_SPRAY | Freq: Every day | RESPIRATORY_TRACT | Status: DC
  Administered 2020-06-26 – 2020-06-27 (×2): 1 via RESPIRATORY_TRACT
  Filled 2020-06-25: qty 7

## 2020-06-25 MED ORDER — AMLODIPINE BESYLATE 5 MG PO TABS
10.0000 mg | ORAL_TABLET | Freq: Every day | ORAL | Status: DC
  Administered 2020-06-25: 10 mg via ORAL
  Filled 2020-06-25: qty 2

## 2020-06-25 MED ORDER — GUAIFENESIN-DM 100-10 MG/5ML PO SYRP
10.0000 mL | ORAL_SOLUTION | ORAL | Status: DC | PRN
  Administered 2020-06-26: 10 mL via ORAL
  Filled 2020-06-25: qty 10

## 2020-06-25 MED ORDER — LISINOPRIL 10 MG PO TABS
20.0000 mg | ORAL_TABLET | Freq: Every day | ORAL | Status: DC
  Administered 2020-06-25: 20 mg via ORAL
  Filled 2020-06-25: qty 2

## 2020-06-25 MED ORDER — ACETAMINOPHEN 325 MG PO TABS
650.0000 mg | ORAL_TABLET | Freq: Four times a day (QID) | ORAL | Status: DC | PRN
  Administered 2020-06-25 – 2020-06-26 (×2): 650 mg via ORAL
  Filled 2020-06-25 (×2): qty 2

## 2020-06-25 MED ORDER — HYDRALAZINE HCL 20 MG/ML IJ SOLN
10.0000 mg | Freq: Four times a day (QID) | INTRAMUSCULAR | Status: DC | PRN
  Administered 2020-06-27: 10 mg via INTRAVENOUS
  Filled 2020-06-25: qty 1

## 2020-06-25 MED ORDER — AMLODIPINE BESYLATE 5 MG PO TABS
10.0000 mg | ORAL_TABLET | Freq: Every day | ORAL | Status: DC
  Administered 2020-06-25 – 2020-06-27 (×3): 10 mg via ORAL
  Filled 2020-06-25 (×3): qty 2

## 2020-06-25 MED ORDER — LISINOPRIL 10 MG PO TABS
20.0000 mg | ORAL_TABLET | Freq: Every day | ORAL | Status: DC
  Administered 2020-06-25 – 2020-06-27 (×3): 20 mg via ORAL
  Filled 2020-06-25 (×3): qty 2

## 2020-06-25 MED ORDER — NICOTINE 21 MG/24HR TD PT24
21.0000 mg | MEDICATED_PATCH | Freq: Every day | TRANSDERMAL | Status: DC
  Administered 2020-06-25 – 2020-06-26 (×2): 21 mg via TRANSDERMAL
  Filled 2020-06-25 (×3): qty 1

## 2020-06-25 MED ORDER — ENOXAPARIN SODIUM 40 MG/0.4ML ~~LOC~~ SOLN
40.0000 mg | SUBCUTANEOUS | Status: DC
  Administered 2020-06-25 – 2020-06-26 (×2): 40 mg via SUBCUTANEOUS
  Filled 2020-06-25 (×2): qty 0.4

## 2020-06-25 MED ORDER — LORAZEPAM 2 MG/ML IJ SOLN: 0.0000 mg | Freq: Two times a day (BID) | INTRAMUSCULAR | Status: DC

## 2020-06-25 MED ORDER — ONDANSETRON HCL 4 MG PO TABS: 4.0000 mg | ORAL_TABLET | Freq: Four times a day (QID) | ORAL | Status: DC | PRN

## 2020-06-25 MED ORDER — PREDNISONE 20 MG PO TABS: 50.0000 mg | ORAL_TABLET | Freq: Every day | ORAL | Status: DC

## 2020-06-25 MED ORDER — METHYLPREDNISOLONE SODIUM SUCC 125 MG IJ SOLR
0.5000 mg/kg | Freq: Two times a day (BID) | INTRAMUSCULAR | Status: DC
  Administered 2020-06-25 – 2020-06-27 (×4): 46.25 mg via INTRAVENOUS
  Filled 2020-06-25 (×4): qty 2

## 2020-06-25 MED ORDER — HYDROCOD POLST-CPM POLST ER 10-8 MG/5ML PO SUER
5.0000 mL | Freq: Two times a day (BID) | ORAL | Status: DC | PRN
  Administered 2020-06-25: 5 mL via ORAL
  Filled 2020-06-25: qty 5

## 2020-06-25 MED ORDER — ALBUTEROL SULFATE HFA 108 (90 BASE) MCG/ACT IN AERS: 2.0000 | INHALATION_SPRAY | RESPIRATORY_TRACT | Status: DC | PRN

## 2020-06-25 MED ORDER — CHLORDIAZEPOXIDE HCL 25 MG PO CAPS
25.0000 mg | ORAL_CAPSULE | Freq: Three times a day (TID) | ORAL | Status: DC
  Administered 2020-06-25 – 2020-06-27 (×6): 25 mg via ORAL
  Filled 2020-06-25 (×6): qty 1

## 2020-06-25 MED ORDER — LORAZEPAM 1 MG PO TABS
0.0000 mg | ORAL_TABLET | Freq: Four times a day (QID) | ORAL | Status: DC
  Administered 2020-06-25: 1 mg via ORAL
  Filled 2020-06-25: qty 1

## 2020-06-25 MED ORDER — ONDANSETRON HCL 4 MG/2ML IJ SOLN: 4.0000 mg | Freq: Four times a day (QID) | INTRAMUSCULAR | Status: DC | PRN

## 2020-06-25 MED ORDER — THIAMINE HCL 100 MG PO TABS
100.0000 mg | ORAL_TABLET | Freq: Every day | ORAL | Status: DC
  Administered 2020-06-25: 100 mg via ORAL
  Filled 2020-06-25: qty 1

## 2020-06-25 MED ORDER — LORAZEPAM 1 MG PO TABS: 0.0000 mg | ORAL_TABLET | Freq: Two times a day (BID) | ORAL | Status: DC

## 2020-06-25 MED ORDER — CHLORDIAZEPOXIDE HCL 25 MG PO CAPS: 50.0000 mg | ORAL_CAPSULE | Freq: Three times a day (TID) | ORAL | Status: DC

## 2020-06-25 MED ORDER — THIAMINE HCL 100 MG/ML IJ SOLN: 100.0000 mg | Freq: Every day | INTRAMUSCULAR | Status: DC
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
## 2020-06-25 NOTE — ED Notes (Signed)
Pt yelling out demanding food and drinks.  Pt reeducated on the call bell.  Pt drink and cracker previously given spilled into the floor.

## 2020-06-25 NOTE — ED Triage Notes (Signed)
Pt brought in by EMS due to shortness of breath and dx of Covid several days ago. Pt was dx and admitted on2/2/22 with covid pneumonia and signed self out AMA. Sats intially 88% on RA. sats 96 on 2 L [place]. Pt report using cocaine within last couple days
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
## 2020-06-25 NOTE — Telephone Encounter (Signed)
Transition Care Management Unsuccessful Follow-up Telephone Call  Date of discharge and from where:  [facility] pt left AMA on 06/23/2020  Attempts:  1st Attempt  Reason for unsuccessful TCM follow-up call: Pt is currently admited  At [facility] for SOB due to COVID infection. Unable to reach at this time. Left VM

## 2020-06-25 NOTE — ED Notes (Signed)
Pt continues to take off BP cuff, oxygen, ekg leads and complain about the care he is being provided.  Pt placed back on leads etc and explained importance.  Pt give a warm blanket.

## 2020-06-25 NOTE — ED Notes (Signed)
Pt removed leads stating he needed to use the restroom. Pt ambulatory with a steady gait

## 2020-06-26 ENCOUNTER — Other Ambulatory Visit: Payer: Self-pay

## 2020-06-26 ENCOUNTER — Encounter (HOSPITAL_COMMUNITY): Payer: Self-pay | Admitting: Internal Medicine

## 2020-06-26 DIAGNOSIS — Z8249 Family history of ischemic heart disease and other diseases of the circulatory system: Secondary | ICD-10-CM | POA: Diagnosis not present

## 2020-06-26 DIAGNOSIS — J9622 Acute and chronic respiratory failure with hypercapnia: Secondary | ICD-10-CM | POA: Diagnosis present

## 2020-06-26 DIAGNOSIS — U071 COVID-19: Secondary | ICD-10-CM | POA: Diagnosis not present

## 2020-06-26 DIAGNOSIS — R0602 Shortness of breath: Secondary | ICD-10-CM | POA: Diagnosis not present

## 2020-06-26 DIAGNOSIS — I5032 Chronic diastolic (congestive) heart failure: Secondary | ICD-10-CM | POA: Diagnosis present

## 2020-06-26 DIAGNOSIS — Y900 Blood alcohol level of less than 20 mg/100 ml: Secondary | ICD-10-CM | POA: Diagnosis present

## 2020-06-26 DIAGNOSIS — F1410 Cocaine abuse, uncomplicated: Secondary | ICD-10-CM | POA: Diagnosis present

## 2020-06-26 DIAGNOSIS — F1010 Alcohol abuse, uncomplicated: Secondary | ICD-10-CM | POA: Diagnosis present

## 2020-06-26 DIAGNOSIS — Z79899 Other long term (current) drug therapy: Secondary | ICD-10-CM | POA: Diagnosis not present

## 2020-06-26 DIAGNOSIS — Z880 Allergy status to penicillin: Secondary | ICD-10-CM | POA: Diagnosis not present

## 2020-06-26 DIAGNOSIS — J189 Pneumonia, unspecified organism: Secondary | ICD-10-CM | POA: Diagnosis not present

## 2020-06-26 DIAGNOSIS — J440 Chronic obstructive pulmonary disease with acute lower respiratory infection: Secondary | ICD-10-CM | POA: Diagnosis present

## 2020-06-26 DIAGNOSIS — F17210 Nicotine dependence, cigarettes, uncomplicated: Secondary | ICD-10-CM | POA: Diagnosis present

## 2020-06-26 DIAGNOSIS — F410 Panic disorder [episodic paroxysmal anxiety] without agoraphobia: Secondary | ICD-10-CM | POA: Diagnosis present

## 2020-06-26 DIAGNOSIS — Z823 Family history of stroke: Secondary | ICD-10-CM | POA: Diagnosis not present

## 2020-06-26 DIAGNOSIS — J1282 Pneumonia due to coronavirus disease 2019: Secondary | ICD-10-CM | POA: Diagnosis present

## 2020-06-26 DIAGNOSIS — J9621 Acute and chronic respiratory failure with hypoxia: Secondary | ICD-10-CM | POA: Diagnosis present

## 2020-06-26 DIAGNOSIS — R0902 Hypoxemia: Secondary | ICD-10-CM | POA: Diagnosis not present

## 2020-06-26 DIAGNOSIS — I110 Hypertensive heart disease with heart failure: Secondary | ICD-10-CM | POA: Diagnosis present

## 2020-06-26 LAB — COMPREHENSIVE METABOLIC PANEL
ALT: 56 U/L — ABNORMAL HIGH (ref 0–44)
AST: 26 U/L (ref 15–41)
Albumin: 3 g/dL — ABNORMAL LOW (ref 3.5–5.0)
Alkaline Phosphatase: 68 U/L (ref 38–126)
Anion gap: 8 (ref 5–15)
BUN: 15 mg/dL (ref 6–20)
CO2: 35 mmol/L — ABNORMAL HIGH (ref 22–32)
Calcium: 8.9 mg/dL (ref 8.9–10.3)
Chloride: 89 mmol/L — ABNORMAL LOW (ref 98–111)
Creatinine, Ser: 0.72 mg/dL (ref 0.61–1.24)
GFR, Estimated: >60 mL/min (ref >60)
Glucose, Bld: 251 mg/dL — ABNORMAL HIGH (ref 70–99)
Potassium: 4 mmol/L (ref 3.5–5.1)
Sodium: 132 mmol/L — ABNORMAL LOW (ref 135–145)
Total Bilirubin: 0.4 mg/dL (ref 0.3–1.2)
Total Protein: 7.6 g/dL (ref 6.5–8.1)

## 2020-06-26 LAB — CBC WITH DIFFERENTIAL/PLATELET
Abs Immature Granulocytes: 0.07 10*3/uL (ref 0.00–0.07)
Basophils Absolute: 0 10*3/uL (ref 0.0–0.1)
Basophils Relative: 0 %
Eosinophils Absolute: 0 10*3/uL (ref 0.0–0.5)
Eosinophils Relative: 0 %
HCT: 49.3 % (ref 39.0–52.0)
Hemoglobin: 16.4 g/dL (ref 13.0–17.0)
Immature Granulocytes: 1 %
Lymphocytes Relative: 10 %
Lymphs Abs: 0.5 10*3/uL — ABNORMAL LOW (ref 0.7–4.0)
MCH: 35.7 pg — ABNORMAL HIGH (ref 26.0–34.0)
MCHC: 33.3 g/dL (ref 30.0–36.0)
MCV: 107.2 fL — ABNORMAL HIGH (ref 80.0–100.0)
Monocytes Absolute: 0.2 10*3/uL (ref 0.1–1.0)
Monocytes Relative: 4 %
Neutro Abs: 4.2 10*3/uL (ref 1.7–7.7)
Neutrophils Relative %: 85 %
Platelets: 168 10*3/uL (ref 150–400)
RBC: 4.6 MIL/uL (ref 4.22–5.81)
RDW: 13.1 % (ref 11.5–15.5)
WBC: 5 10*3/uL (ref 4.0–10.5)
nRBC: 0 % (ref 0.0–0.2)

## 2020-06-26 LAB — FERRITIN: Ferritin: 408 ng/mL — ABNORMAL HIGH (ref 24–336)

## 2020-06-26 LAB — MAGNESIUM: Magnesium: 1.9 mg/dL (ref 1.7–2.4)

## 2020-06-26 LAB — D-DIMER, QUANTITATIVE: D-Dimer, Quant: 0.84 ug/mL-FEU — ABNORMAL HIGH (ref 0.00–0.50)

## 2020-06-26 LAB — C-REACTIVE PROTEIN: CRP: 1.9 mg/dL — ABNORMAL HIGH (ref <1.0)

## 2020-06-26 NOTE — Progress Notes (Signed)
PROGRESS NOTE    [name]  [id number] DOB: [birth year] DOA: 06/25/2020 PCP: [name], NP    Brief Narrative:  55 year old male with a history of COPD, polysubstance abuse, initially admitted to the hospital with Covid pneumonia and respiratory failure.  He signed out [place] and returned back to the hospital within 48 hours for persistent shortness of breath and hypoxia.  He was readmitted to the hospital, started on steroids and supplemental oxygen.  Plan is to discharge home once respiratory status has stabilized   Assessment & Plan:   Active Problems:   Cocaine abuse (HCC)   Alcohol abuse   Chronic diastolic CHF (congestive heart failure) (HCC)   Pneumonia due to COVID-19 virus   HTN (hypertension)   Acute on chronic respiratory failure with hypoxia and hypercapnia (HCC)    Acute on chronic respiratory failure with hypoxia and hypercapnia. -ABG was checked and pH noted to be close to normal range. -I suspect that his PCO2 normally runs in the 60s. -Currently on 2 L of oxygen -Breathing became very labored on ambulation today -We will reattempt ambulation tomorrow  COVID-19 pneumonia -He received remdesivir on his previous hospitalization -At this point, it appears he is more than 7 days out from onset of symptoms at which point remdesivir would be less effective -Continue on steroids -Inflammatory markers are trending down  Hypertension -Suspect the blood pressure is elevated in the setting of cocaine use -Continue on amlodipine and lisinopril -Use hydralazine as needed  Chronic diastolic heart failure -Currently appears compensated  Alcohol abuse/withdrawal -He did receive a dose of Ativan on admission which apparently made him more agitated -Medications switched to scheduled Librium -Currently appears calm without any significant tremors -He is not interested in pursuing any alcohol rehab programs  Tobacco use -Nicotine  patch  COPD -Continue albuterol as needed -He is also on steroids  Cocaine abuse -He is not interested in pursuing any drug rehab programs    DVT prophylaxis: enoxaparin (LOVENOX) injection 40 mg Start: 06/25/20 2200  Code Status: Full code Family Communication: Discussed with patient Disposition Plan: Status is: Inpatient  Remains inpatient appropriate because:Inpatient level of care appropriate due to severity of illness   Dispo: The patient is from: Home              Anticipated d/c is to: Home              Anticipated d/c date is: 1 day              Patient currently is not medically stable to d/c.   Difficult to place patient No    Consultants:     Procedures:     Antimicrobials:       Subjective: Feels her breathing is doing a little better.  He does have cough.  Does not appear to be tremulous  Objective: Vitals:   06/26/20 0600 06/26/20 0814 06/26/20 0913 06/26/20 1136  BP: (!) 153/93  (!) 179/115 (!) 156/89  Pulse: (!) 58  86 66  Resp: (!) 22  (!) 21 20  Temp: 98.2 F (36.8 C)   98.2 F (36.8 C)  TempSrc: Oral   Oral  SpO2: 95% 92% 94% 95%  Weight:      Height:        Intake/Output Summary (Last 24 hours) at 06/26/2020 1911 Last data filed at 06/26/2020 1154 Gross per 24 hour  Intake 360 ml  Output 950 ml  Net -590 ml  Filed Weights   06/25/20 0742  Weight: 93 kg    Examination:  General exam: Appears calm and comfortable  Respiratory system: Clear to auscultation. Respiratory effort normal. Cardiovascular system: S1 & S2 heard, RRR. No JVD, murmurs, rubs, gallops or clicks. No pedal edema. Gastrointestinal system: Abdomen is nondistended, soft and nontender. No organomegaly or masses felt. Normal bowel sounds heard. Central nervous system: Alert and oriented. No focal neurological deficits. Extremities: Symmetric 5 x 5 power. Skin: No rashes, lesions or ulcers Psychiatry: Judgement and insight appear normal. Mood & affect  appropriate.     Data Reviewed: I have personally reviewed following labs and imaging studies  CBC: Recent Labs  Lab 06/22/20 0942 06/22/20 1243 06/23/20 0620 06/25/20 0750 06/25/20 1827 06/26/20 0633  WBC 6.8 7.2 6.8 7.2 6.5 5.0  NEUTROABS 5.0  --  5.8 4.9 4.4 4.2  HGB 16.2 16.5 15.2 15.5 16.4 16.4  HCT 47.2 47.5 46.1 46.3 48.1 49.3  MCV 104.2* 105.1* 109.5* 108.9* 107.4* 107.2*  PLT 151 153 154 165 177 993   Basic Metabolic Panel: Recent Labs  Lab 06/22/20 0942 06/22/20 1243 06/23/20 0620 06/25/20 0904 06/25/20 1827 06/26/20 0633  NA 127*  --  134* 138 135 132*  K 3.7  --  5.5* 4.3 4.0 4.0  CL 90*  --  95* 93* 91* 89*  CO2 29  --  33* 37* 34* 35*  GLUCOSE 145*  --  175* 144* 156* 251*  BUN 12  --  18 16 13 15   CREATININE 0.67 0.68 0.82 0.71 0.69 0.72  CALCIUM 8.4*  --  8.8* 8.8* 8.3* 8.9  MG  --   --  2.0  --   --  1.9  PHOS  --   --  4.5  --   --   --    GFR: Estimated Creatinine Clearance: 113.5 mL/min (by C-G formula based on SCr of 0.72 mg/dL). Liver Function Tests: Recent Labs  Lab 06/22/20 0942 06/23/20 0620 06/25/20 0904 06/25/20 1827 06/26/20 0633  AST 68* 36 41 35 26  ALT 62* 53* 63* 62* 56*  ALKPHOS 57 46 54 66 68  BILITOT 1.0 0.7 0.7 0.8 0.4  PROT 8.4* 7.6 7.5 7.6 7.6  ALBUMIN 3.3* 2.8* 3.1* 3.1* 3.0*   Recent Labs  Lab 06/25/20 0904  LIPASE 55*   No results for input(s): AMMONIA in the last 168 hours. Coagulation Profile: Recent Labs  Lab 06/22/20 0942  INR 1.0   Cardiac Enzymes: No results for input(s): CKTOTAL, CKMB, CKMBINDEX, TROPONINI in the last 168 hours. BNP (last 3 results) No results for input(s): PROBNP in the last 8760 hours. HbA1C: No results for input(s): HGBA1C in the last 72 hours. CBG: Recent Labs  Lab 06/22/20 1644  GLUCAP 376*   Lipid Profile: No results for input(s): CHOL, HDL, LDLCALC, TRIG, CHOLHDL, LDLDIRECT in the last 72 hours. Thyroid Function Tests: No results for input(s): TSH, T4TOTAL,  FREET4, T3FREE, THYROIDAB in the last 72 hours. Anemia Panel: Recent Labs    06/25/20 1827 06/26/20 0633  FERRITIN 377* 408*   Sepsis Labs: Recent Labs  Lab 06/22/20 0941 06/22/20 0952 06/22/20 1243 06/25/20 1827  PROCALCITON  --   --  <0.10 <0.10  LATICACIDVEN 1.1 0.9  --   --     Recent Results (from the past 240 hour(s))  SARS Coronavirus 2 by RT PCR (hospital order, performed in [facility] hospital lab) Nasopharyngeal Nasopharyngeal Swab     Status: Abnormal   Collection  Time: 06/22/20  9:19 AM   Specimen: Nasopharyngeal Swab  Result Value Ref Range Status   SARS Coronavirus 2 POSITIVE (A) NEGATIVE Final    Comment: RESULT CALLED TO, READ BACK BY AND VERIFIED WITH: EMILY GANT @1118  06/22/20 BY JONES,T (NOTE) SARS-CoV-2 target nucleic acids are DETECTED  SARS-CoV-2 RNA is generally detectable in upper respiratory specimens  during the acute phase of infection.  Positive results are indicative  of the presence of the identified virus, but do not rule out bacterial infection or co-infection with other pathogens not detected by the test.  Clinical correlation with patient history and  other diagnostic information is necessary to determine patient infection status.  The expected result is negative.  Fact Sheet for Patients:   StrictlyIdeas.no   Fact Sheet for Healthcare Providers:   BankingDealers.co.za    This test is not yet approved or cleared by the [place] FDA and  has been authorized for detection and/or diagnosis of SARS-CoV-2 by FDA under an Emergency Use Authorization (EUA).  This EUA will remain in effect (meaning this te st can be used) for the duration of  the COVID-19 declaration under Section 564(b)(1) of the Act, 21 U.S.C. section 360-bbb-3(b)(1), unless the authorization is terminated or revoked sooner.  Performed at [facility], [address] [place], [place] [address]   Blood Culture (routine x  2)     Status: None (Preliminary result)   Collection Time: 06/22/20  9:42 AM   Specimen: BLOOD  Result Value Ref Range Status   Specimen Description BLOOD RIGHT ARM  Final   Special Requests   Final    BOTTLES DRAWN AEROBIC AND ANAEROBIC Blood Culture adequate volume   Culture   Final    NO GROWTH 4 DAYS Performed at [facility], [address] [place], [place] [address]    Report Status PENDING  Incomplete  Blood Culture (routine x 2)     Status: None (Preliminary result)   Collection Time: 06/22/20  9:43 AM   Specimen: BLOOD  Result Value Ref Range Status   Specimen Description BLOOD LEFT ARM  Final   Special Requests   Final    BOTTLES DRAWN AEROBIC AND ANAEROBIC Blood Culture adequate volume   Culture   Final    NO GROWTH 4 DAYS Performed at [facility], [address] [place], [place] [address]    Report Status PENDING  Incomplete  Urine culture     Status: None   Collection Time: 06/22/20 11:23 AM   Specimen: Urine, Catheterized  Result Value Ref Range Status   Specimen Description   Final    URINE, CATHETERIZED Performed at [facility], [address] [place], [place] [address]    Special Requests   Final    NONE Performed at [facility], [address] [place], [place] [address]    Culture   Final    NO GROWTH Performed at [facility], [place] [address], [place], [place] [address]    Report Status 06/23/2020 FINAL  Final  MRSA PCR Screening     Status: None   Collection Time: 06/22/20 10:47 PM   Specimen: Nasal Mucosa; Nasopharyngeal  Result Value Ref Range Status   MRSA by PCR NEGATIVE NEGATIVE Final    Comment:        The GeneXpert MRSA Assay (FDA approved for NASAL specimens only), is one component of a comprehensive MRSA colonization surveillance program. It is not intended to diagnose MRSA infection nor to guide or  monitor treatment for MRSA infections. Performed at [facility], [address], [place], [place] [address]           Radiology Studies: [facility] Chest [facility] 1 View  Result Date: 06/25/2020 CLINICAL DATA:  COVID-19 pneumonia, shortness of breath. EXAM: PORTABLE CHEST 1 VIEW COMPARISON:  June 22, 2020. FINDINGS: Stable cardiomediastinal silhouette. No pneumothorax or pleural effusion is noted. Stable patchy airspace opacities are noted consistent with multifocal pneumonia. Bony thorax is unremarkable. IMPRESSION: Stable bilateral multifocal pneumonia. Electronically Signed   By: Marijo Conception M.D.   On: 06/25/2020 08:21        Scheduled Meds: . amLODipine  10 mg Oral Daily  . chlordiazePOXIDE  25 mg Oral TID  . enoxaparin (LOVENOX) injection  40 mg Subcutaneous Q24H  . lisinopril  20 mg Oral Daily  . methylPREDNISolone (SOLU-MEDROL) injection  0.5 mg/kg Intravenous Q12H   Followed by  . [START ON 06/28/2020] predniSONE  50 mg Oral Daily  . nicotine  21 mg Transdermal Daily  . umeclidinium bromide  1 puff Inhalation Daily   Continuous Infusions:   LOS: 0 days    Time spent: 53mins    Kathie Dike, MD [facility]   If 7PM-7AM, please contact night-coverage [email or web address]  06/26/2020, 7:11 PM

## 2020-06-26 NOTE — Progress Notes (Signed)
SATURATION QUALIFICATIONS: (This note is used to comply with regulatory documentation for home oxygen)  Patient Saturations on Room Air at Rest = 87%  Patient Saturations on Room Air while Ambulating = %  Patient Saturations on 2Liters of oxygen while Ambulating = 90%  Please briefly explain why patient needs home oxygen: patient was resting in bed without oxygen. MD removed to evaluate patients need for oxygen. Patient desated to 52 on room air. Placed oxygen on patient at 2 liters oxygen saturation returned to 90. Patient has periods of labored breathing while resting. MD will be made aware.

## 2020-06-27 DIAGNOSIS — I10 Essential (primary) hypertension: Secondary | ICD-10-CM

## 2020-06-27 DIAGNOSIS — J1282 Pneumonia due to coronavirus disease 2019: Secondary | ICD-10-CM

## 2020-06-27 LAB — COMPREHENSIVE METABOLIC PANEL
ALT: 41 U/L (ref 0–44)
AST: 19 U/L (ref 15–41)
Albumin: 2.8 g/dL — ABNORMAL LOW (ref 3.5–5.0)
Alkaline Phosphatase: 58 U/L (ref 38–126)
Anion gap: 9 (ref 5–15)
BUN: 19 mg/dL (ref 6–20)
CO2: 31 mmol/L (ref 22–32)
Calcium: 8.8 mg/dL — ABNORMAL LOW (ref 8.9–10.3)
Chloride: 92 mmol/L — ABNORMAL LOW (ref 98–111)
Creatinine, Ser: 0.67 mg/dL (ref 0.61–1.24)
GFR, Estimated: >60 mL/min (ref >60)
Glucose, Bld: 273 mg/dL — ABNORMAL HIGH (ref 70–99)
Potassium: 4.2 mmol/L (ref 3.5–5.1)
Sodium: 132 mmol/L — ABNORMAL LOW (ref 135–145)
Total Bilirubin: 0.6 mg/dL (ref 0.3–1.2)
Total Protein: 7.2 g/dL (ref 6.5–8.1)

## 2020-06-27 LAB — CULTURE, BLOOD (ROUTINE X 2)
Culture: NO GROWTH
Culture: NO GROWTH
Special Requests: ADEQUATE
Special Requests: ADEQUATE

## 2020-06-27 LAB — CBC WITH DIFFERENTIAL/PLATELET
Abs Immature Granulocytes: 0.11 10*3/uL — ABNORMAL HIGH (ref 0.00–0.07)
Basophils Absolute: 0 10*3/uL (ref 0.0–0.1)
Basophils Relative: 0 %
Eosinophils Absolute: 0 10*3/uL (ref 0.0–0.5)
Eosinophils Relative: 0 %
HCT: 47 % (ref 39.0–52.0)
Hemoglobin: 16.2 g/dL (ref 13.0–17.0)
Immature Granulocytes: 1 %
Lymphocytes Relative: 7 %
Lymphs Abs: 0.8 10*3/uL (ref 0.7–4.0)
MCH: 36.3 pg — ABNORMAL HIGH (ref 26.0–34.0)
MCHC: 34.5 g/dL (ref 30.0–36.0)
MCV: 105.4 fL — ABNORMAL HIGH (ref 80.0–100.0)
Monocytes Absolute: 0.4 10*3/uL (ref 0.1–1.0)
Monocytes Relative: 3 %
Neutro Abs: 11.5 10*3/uL — ABNORMAL HIGH (ref 1.7–7.7)
Neutrophils Relative %: 89 %
Platelets: 197 10*3/uL (ref 150–400)
RBC: 4.46 MIL/uL (ref 4.22–5.81)
RDW: 12.9 % (ref 11.5–15.5)
WBC: 12.8 10*3/uL — ABNORMAL HIGH (ref 4.0–10.5)
nRBC: 0 % (ref 0.0–0.2)

## 2020-06-27 LAB — D-DIMER, QUANTITATIVE: D-Dimer, Quant: 0.46 ug/mL-FEU (ref 0.00–0.50)

## 2020-06-27 LAB — FERRITIN: Ferritin: 321 ng/mL (ref 24–336)

## 2020-06-27 LAB — MAGNESIUM: Magnesium: 2 mg/dL (ref 1.7–2.4)

## 2020-06-27 LAB — C-REACTIVE PROTEIN: CRP: 0.9 mg/dL (ref <1.0)

## 2020-06-27 MED ORDER — LISINOPRIL 20 MG PO TABS: 20.0000 mg | ORAL_TABLET | Freq: Every day | ORAL | 1 refills | Status: DC

## 2020-06-27 MED ORDER — SPIRIVA HANDIHALER 18 MCG IN CAPS: 18.0000 ug | ORAL_CAPSULE | Freq: Every day | RESPIRATORY_TRACT | 0 refills | Status: DC

## 2020-06-27 MED ORDER — PREDNISONE 50 MG PO TABS: 50.0000 mg | ORAL_TABLET | Freq: Every day | ORAL | 0 refills | Status: DC

## 2020-06-27 MED ORDER — AMLODIPINE BESYLATE 10 MG PO TABS: 10.0000 mg | ORAL_TABLET | Freq: Every day | ORAL | 1 refills | Status: DC

## 2020-06-27 MED ORDER — ALBUTEROL SULFATE HFA 108 (90 BASE) MCG/ACT IN AERS: 2.0000 | INHALATION_SPRAY | RESPIRATORY_TRACT | 1 refills | Status: DC | PRN

## 2020-06-27 MED ORDER — FUROSEMIDE 20 MG PO TABS: 20.0000 mg | ORAL_TABLET | Freq: Two times a day (BID) | ORAL | 0 refills | Status: DC

## 2020-06-27 MED ORDER — GUAIFENESIN-DM 100-10 MG/5ML PO SYRP: 10.0000 mL | ORAL_SOLUTION | ORAL | 0 refills | Status: DC | PRN

## 2020-06-27 MED ORDER — BUDESONIDE-FORMOTEROL FUMARATE 160-4.5 MCG/ACT IN AERO: 2.0000 | INHALATION_SPRAY | Freq: Two times a day (BID) | RESPIRATORY_TRACT | 12 refills | Status: DC

## 2020-06-27 NOTE — Progress Notes (Signed)
Patient being discharged home with home oxygen. Oxygen tank has been delivered to patients room. Patient didn't have transportation to go home. This nurse spoke to Education officer, museum and social worker arranged with [facility] transportation to take patient home. Patients iv has been taking out with no complications and discharge paper work has been given and reviewed with patient. Patient signed ride waiver form for [facility] transportation. Awaiting for [facility] transportation to arrive to transport patient home.

## 2020-06-27 NOTE — TOC Transition Note (Addendum)
Transition of Care [facility]) - CM/SW Discharge Note   Patient Details  Name: [name] MRN: [id number] Date of Birth: [birth year]  [facility] of [facility]) CM/SW Contact:  Boneta Lucks, RN Phone Number: 06/27/2020, 2:09 PM   Clinical Narrative:  Patient admitted with Covid pneumonia. Patient is requiring oxygen for discharge. Ashly with [facility] accepted the referral. Will call family to do home set up and they can bring oxygen to hospital for transport home. RN updated to dc plan.    Addendum : [facility] delivered oxygen to the hospital, due to patient not have a ride home. Patient is covid positive, TOC set up [facility] transport.  RN to get rider waiver signed. They will call [phone] when they arrive to bring patient down to lobby.   Final next level of care: Home/Self Care Barriers to Discharge: Barriers Resolved   Patient Goals and CMS Choice Patient states their goals for this hospitalization and ongoing recovery are:: to go home. CMS Medicare.gov Compare Post Acute Care list provided to:: Patient  Discharge Placement    Patient and family notified of of transfer: 06/27/20  Discharge Plan and Services             DME Arranged: Oxygen DME Agency: [facility] Date DME Agency Contacted: 06/27/20 Time DME Agency Contacted: 1200 Representative spoke with at DME Agency: Ashly    Readmission Risk Interventions Readmission Risk Prevention Plan 06/27/2020 10/21/2019  Transportation Screening Complete Complete  PCP or Specialist Appt within 3-5 Days - Complete  HRI or [place] - Complete  Social Work Consult for [place] Planning/Counseling - Complete  Palliative Care Screening - Complete  Medication Review Press photographer) Complete Complete  PCP or Specialist appointment within 3-5 days of discharge Complete -  [place] or Home Care Consult Complete -  SW Recovery Care/Counseling Consult Complete -  Palliative Care Screening Not Applicable -  [employer] Not Applicable  -  Some recent data might be hidden

## 2020-06-27 NOTE — Discharge Summary (Signed)
Physician Discharge Summary  [name] [id number] DOB: 08-26-1965 DOA: 06/25/2020  PCP: [name], NP  Admit date: 06/25/2020 Discharge date: 06/27/2020  Admitted From: Home Disposition: Home  Recommendations for Outpatient Follow-up:  1. Follow up with PCP in 1-2 weeks 2. Please obtain BMP/CBC in one week  Home Health: Equipment/Devices: Oxygen at 2 L  Discharge Condition: Stable CODE STATUS: Full code Diet recommendation: Heart healthy  Brief/Interim Summary: 55 year old male with a history of COPD, polysubstance abuse, initially admitted to the hospital with Covid pneumonia and respiratory failure.  He signed out [place] and returned back to the hospital within 48 hours for persistent shortness of breath and hypoxia.  He was readmitted to the hospital, started on steroids and supplemental oxygen.  Plan is to discharge home once respiratory status has stabilized  Discharge Diagnoses:  Active Problems:   Cocaine abuse (HCC)   Alcohol abuse   Chronic diastolic CHF (congestive heart failure) (HCC)   Pneumonia due to COVID-19 virus   HTN (hypertension)   Acute on chronic respiratory failure with hypoxia and hypercapnia (HCC)  Acute on chronic respiratory failure with hypoxia and hypercapnia. -ABG was checked and pH noted to be close to normal range. -I suspect that his PCO2 normally runs in the 60s. -Currently on 2 L of oxygen and oxygen saturations have remained stable  COVID-19 pneumonia -He received remdesivir on his previous hospitalization -At this point, it appears he is more than 7 days out from onset of symptoms at which point remdesivir would be less effective -Continue on steroids -Inflammatory markers are trending down  Hypertension -Suspect the blood pressure is elevated in the setting of cocaine use -Continue on amlodipine and lisinopril -Use hydralazine as needed  Chronic diastolic heart failure -Currently appears  compensated  Alcohol abuse/withdrawal -He did receive a dose of Ativan on admission which apparently made him more agitated -Medications switched to scheduled Librium and he appears to tolerate this well -Currently appears calm without any significant tremors -He is not interested in pursuing any alcohol rehab programs  Tobacco use -Nicotine patch  COPD -Continue albuterol as needed -He is also on steroids -I have also started him on Symbicort  Cocaine abuse -He is not interested in pursuing any drug rehab programs  Discharge Instructions  Discharge Instructions    Diet - low sodium heart healthy   Complete by: As directed    Increase activity slowly   Complete by: As directed      Allergies as of 06/27/2020      Reactions   Penicillins    From childhood: Has patient had a PCN reaction causing immediate rash, facial/tongue/throat swelling, SOB or lightheadedness with hypotension: Unknown Has patient had a PCN reaction causing severe rash involving mucus membranes or skin necrosis: Unknown Has patient had a PCN reaction that required hospitalization: Unknown Has patient had a PCN reaction occurring within the last 10 years: No If all of the above answers are "NO", then may proceed with Cephalosporin use.      Medication List    STOP taking these medications   hydrochlorothiazide 25 MG tablet Commonly known as: HYDRODIURIL     TAKE these medications   albuterol 108 (90 Base) MCG/ACT inhaler Commonly known as: VENTOLIN HFA Inhale 2 puffs into the lungs every 4 (four) hours as needed for wheezing or shortness of breath (cough).   amLODipine 10 MG tablet Commonly known as: NORVASC Take 1 tablet (10 mg total) by mouth daily.   budesonide-formoterol  160-4.5 MCG/ACT inhaler Commonly known as: Symbicort Inhale 2 puffs into the lungs in the morning and at bedtime.   furosemide 20 MG tablet Commonly known as: LASIX Take 1 tablet (20 mg total) by mouth 2 (two) times  daily.   guaiFENesin-dextromethorphan 100-10 MG/5ML syrup Commonly known as: ROBITUSSIN DM Take 10 mLs by mouth every 4 (four) hours as needed for cough.   lisinopril 20 MG tablet Commonly known as: ZESTRIL Take 1 tablet (20 mg total) by mouth daily.   predniSONE 50 MG tablet Commonly known as: DELTASONE Take 1 tablet (50 mg total) by mouth daily. Start taking on: June 28, 2020   Spiriva HandiHaler 18 MCG inhalation capsule Generic drug: tiotropium Place 1 capsule (18 mcg total) into inhaler and inhale daily.            Durable Medical Equipment  (From admission, onward)         Start     Ordered   06/27/20 1051  For home use only DME oxygen  Once       Question Answer Comment  Length of Need 6 Months   Mode or (Route) Nasal cannula   Liters per Minute 2   Frequency Continuous (stationary and portable oxygen unit needed)   Oxygen conserving device Yes   Oxygen delivery system Gas      06/27/20 [place] Follow up.   Why:  Home Oxygen  Contact information: [place] [address] [phone]        [name], NP. Schedule an appointment as soon as possible for a visit in 3 week(s).   Specialty: Internal Medicine Contact information: [address] [place] [address] [phone]              Allergies  Allergen Reactions  . Penicillins     From childhood: Has patient had a PCN reaction causing immediate rash, facial/tongue/throat swelling, SOB or lightheadedness with hypotension: Unknown Has patient had a PCN reaction causing severe rash involving mucus membranes or skin necrosis: Unknown Has patient had a PCN reaction that required hospitalization: Unknown Has patient had a PCN reaction occurring within the last 10 years: No If all of the above answers are "NO", then may proceed with Cephalosporin use.     Consultations:     Procedures/Studies: DG  Chest Port 1 View  Result Date: 06/25/2020 CLINICAL DATA:  COVID-19 pneumonia, shortness of breath. EXAM: PORTABLE CHEST 1 VIEW COMPARISON:  June 22, 2020. FINDINGS: Stable cardiomediastinal silhouette. No pneumothorax or pleural effusion is noted. Stable patchy airspace opacities are noted consistent with multifocal pneumonia. Bony thorax is unremarkable. IMPRESSION: Stable bilateral multifocal pneumonia. Electronically Signed   By: Marijo Conception M.D.   On: 06/25/2020 08:21   DG Chest Port 1 View  Result Date: 06/22/2020 CLINICAL DATA:  Questionable sepsis. EXAM: PORTABLE CHEST 1 VIEW COMPARISON:  05/15/2020. FINDINGS: Stable cardiomegaly. No pulmonary venous congestion. Diffuse bilateral pulmonary interstitial infiltrates. Findings consistent with pneumonitis. Question patient's COVID status. No pleural effusion or pneumothorax. IMPRESSION: 1. Stable cardiomegaly.  No pulmonary venous congestion. 2. Diffuse bilateral pulmonary interstitial infiltrates consistent with pneumonitis. Question patient's COVID status. Electronically Signed   By: Marcello Moores  Register   On: 06/22/2020 09:48       Subjective: Continues to have cough.  Overall he feels weak.  Still has some mild shortness of breath.  Vitals have  been stable on oxygen.  Able to ambulate to the bathroom.  Discharge Exam: Vitals:   06/27/20 0032 06/27/20 0600 06/27/20 0753 06/27/20 0937  BP: (!) 162/107 118/75  (!) 156/80  Pulse: 66 (!) 58  71  Resp: 16 17  20   Temp: 98.1 F (36.7 C) 98.2 F (36.8 C)    TempSrc: Oral Oral    SpO2: 92% 96% 96% 93%  Weight:      Height:        General: Pt is alert, awake, not in acute distress Cardiovascular: RRR, S1/S2 +, no rubs, no gallops Respiratory: CTA bilaterally, no wheezing, no rhonchi Abdominal: Soft, NT, ND, bowel sounds + Extremities: no edema, no cyanosis    The results of significant diagnostics from this hospitalization (including imaging, microbiology, ancillary and  laboratory) are listed below for reference.     Microbiology: Recent Results (from the past 240 hour(s))  SARS Coronavirus 2 by RT PCR (hospital order, performed in [facility] hospital lab) Nasopharyngeal Nasopharyngeal Swab     Status: Abnormal   Collection Time: 06/22/20  9:19 AM   Specimen: Nasopharyngeal Swab  Result Value Ref Range Status   SARS Coronavirus 2 POSITIVE (A) NEGATIVE Final    Comment: RESULT CALLED TO, READ BACK BY AND VERIFIED WITH: EMILY GANT @1118  06/22/20 BY JONES,T (NOTE) SARS-CoV-2 target nucleic acids are DETECTED  SARS-CoV-2 RNA is generally detectable in upper respiratory specimens  during the acute phase of infection.  Positive results are indicative  of the presence of the identified virus, but do not rule out bacterial infection or co-infection with other pathogens not detected by the test.  Clinical correlation with patient history and  other diagnostic information is necessary to determine patient infection status.  The expected result is negative.  Fact Sheet for Patients:   StrictlyIdeas.no   Fact Sheet for Healthcare Providers:   BankingDealers.co.za    This test is not yet approved or cleared by the [place] FDA and  has been authorized for detection and/or diagnosis of SARS-CoV-2 by FDA under an Emergency Use Authorization (EUA).  This EUA will remain in effect (meaning this te st can be used) for the duration of  the COVID-19 declaration under Section 564(b)(1) of the Act, 21 U.S.C. section 360-bbb-3(b)(1), unless the authorization is terminated or revoked sooner.  Performed at [facility], [address] [place], [place] [address]   Blood Culture (routine x 2)     Status: None   Collection Time: 06/22/20  9:42 AM   Specimen: BLOOD  Result Value Ref Range Status   Specimen Description BLOOD RIGHT ARM  Final   Special Requests   Final    BOTTLES DRAWN AEROBIC AND ANAEROBIC Blood  Culture adequate volume   Culture   Final    NO GROWTH 5 DAYS Performed at [facility], [address] [place],  [address]    Report Status 06/27/2020 FINAL  Final  Blood Culture (routine x 2)     Status: None   Collection Time: 06/22/20  9:43 AM   Specimen: BLOOD  Result Value Ref Range Status   Specimen Description BLOOD LEFT ARM  Final   Special Requests   Final    BOTTLES DRAWN AEROBIC AND ANAEROBIC Blood Culture adequate volume   Culture   Final    NO GROWTH 5 DAYS Performed at [facility], [address], [place],  [address]    Report Status 06/27/2020 FINAL  Final  Urine culture  Status: None   Collection Time: 06/22/20 11:23 AM   Specimen: Urine, Catheterized  Result Value Ref Range Status   Specimen Description   Final    URINE, CATHETERIZED Performed at [facility], [address], [place], [place] [address]    Special Requests   Final    NONE Performed at [facility], [address], [place], [place] [address]    Culture   Final    NO GROWTH Performed at [facility], [place] [address] [place], [place] [address]    Report Status 06/23/2020 FINAL  Final  MRSA PCR Screening     Status: None   Collection Time: 06/22/20 10:47 PM   Specimen: Nasal Mucosa; Nasopharyngeal  Result Value Ref Range Status   MRSA by PCR NEGATIVE NEGATIVE Final    Comment:        The GeneXpert MRSA Assay (FDA approved for NASAL specimens only), is one component of a comprehensive MRSA colonization surveillance program. It is not intended to diagnose MRSA infection nor to guide or monitor treatment for MRSA infections. Performed at [facility], [address] [place], [place] [address]      Labs: BNP (last 3 results) Recent Labs    11/30/19 0653 03/13/20 0946 03/15/20 0922  BNP 187.4* 275.5* 867.6*   Basic Metabolic Panel: Recent Labs  Lab 06/23/20 0620 06/25/20 0904 06/25/20 1827 06/26/20 0633 06/27/20 0516  NA 134* 138 135 132* 132*  K  5.5* 4.3 4.0 4.0 4.2  CL 95* 93* 91* 89* 92*  CO2 33* 37* 34* 35* 31  GLUCOSE 175* 144* 156* 251* 273*  BUN 18 16 13 15 19   CREATININE 0.82 0.71 0.69 0.72 0.67  CALCIUM 8.8* 8.8* 8.3* 8.9 8.8*  MG 2.0  --   --  1.9 2.0  PHOS 4.5  --   --   --   --    Liver Function Tests: Recent Labs  Lab 06/23/20 0620 06/25/20 0904 06/25/20 1827 06/26/20 0633 06/27/20 0516  AST 36 41 35 26 19  ALT 53* 63* 62* 56* 41  ALKPHOS 46 54 66 68 58  BILITOT 0.7 0.7 0.8 0.4 0.6  PROT 7.6 7.5 7.6 7.6 7.2  ALBUMIN 2.8* 3.1* 3.1* 3.0* 2.8*   Recent Labs  Lab 06/25/20 0904  LIPASE 55*   No results for input(s): AMMONIA in the last 168 hours. CBC: Recent Labs  Lab 06/23/20 0620 06/25/20 0750 06/25/20 1827 06/26/20 0633 06/27/20 0516  WBC 6.8 7.2 6.5 5.0 12.8*  NEUTROABS 5.8 4.9 4.4 4.2 11.5*  HGB 15.2 15.5 16.4 16.4 16.2  HCT 46.1 46.3 48.1 49.3 47.0  MCV 109.5* 108.9* 107.4* 107.2* 105.4*  PLT 154 165 177 168 197   Cardiac Enzymes: No results for input(s): CKTOTAL, CKMB, CKMBINDEX, TROPONINI in the last 168 hours. BNP: Invalid input(s): POCBNP CBG: Recent Labs  Lab 06/22/20 1644  GLUCAP 376*   D-Dimer Recent Labs    06/26/20 0633 06/27/20 0516  DDIMER 0.84* 0.46   Hgb A1c No results for input(s): HGBA1C in the last 72 hours. Lipid Profile No results for input(s): CHOL, HDL, LDLCALC, TRIG, CHOLHDL, LDLDIRECT in the last 72 hours. Thyroid function studies No results for input(s): TSH, T4TOTAL, T3FREE, THYROIDAB in the last 72 hours.  Invalid input(s): FREET3 Anemia work up Recent Labs    06/26/20 0633 06/27/20 0516  FERRITIN 408* 321   Urinalysis    Component Value Date/Time   COLORURINE YELLOW 06/22/2020 [place] 06/22/2020 1123  LABSPEC 1.009 06/22/2020 1123   PHURINE 6.0 06/22/2020 1123   GLUCOSEU NEGATIVE 06/22/2020 1123   HGBUR SMALL (A) 06/22/2020 1123   BILIRUBINUR NEGATIVE 06/22/2020 1123   KETONESUR NEGATIVE 06/22/2020 1123    PROTEINUR NEGATIVE 06/22/2020 1123   UROBILINOGEN 1.0 10/16/2010 0217   NITRITE NEGATIVE 06/22/2020 1123   LEUKOCYTESUR NEGATIVE 06/22/2020 1123   Sepsis Labs Invalid input(s): PROCALCITONIN,  WBC,  LACTICIDVEN Microbiology Recent Results (from the past 240 hour(s))  SARS Coronavirus 2 by RT PCR (hospital order, performed in [facility] hospital lab) Nasopharyngeal Nasopharyngeal Swab     Status: Abnormal   Collection Time: 06/22/20  9:19 AM   Specimen: Nasopharyngeal Swab  Result Value Ref Range Status   SARS Coronavirus 2 POSITIVE (A) NEGATIVE Final    Comment: RESULT CALLED TO, READ BACK BY AND VERIFIED WITH: EMILY GANT @1118  06/22/20 BY JONES,T (NOTE) SARS-CoV-2 target nucleic acids are DETECTED  SARS-CoV-2 RNA is generally detectable in upper respiratory specimens  during the acute phase of infection.  Positive results are indicative  of the presence of the identified virus, but do not rule out bacterial infection or co-infection with other pathogens not detected by the test.  Clinical correlation with patient history and  other diagnostic information is necessary to determine patient infection status.  The expected result is negative.  Fact Sheet for Patients:   BoilerBrush.com.cyhttps://www.fda.gov/media/136312/download   Fact Sheet for Healthcare Providers:   https://pope.com/https://www.fda.gov/media/136313/download    This test is not yet approved or cleared by the [place] FDA and  has been authorized for detection and/or diagnosis of SARS-CoV-2 by FDA under an Emergency Use Authorization (EUA).  This EUA will remain in effect (meaning this te st can be used) for the duration of  the COVID-19 declaration under Section 564(b)(1) of the Act, 21 U.S.C. section 360-bbb-3(b)(1), unless the authorization is terminated or revoked sooner.  Performed at [facility], [address] [place], [place] [address]   Blood Culture (routine x 2)     Status: None   Collection Time: 06/22/20  9:42 AM    Specimen: BLOOD  Result Value Ref Range Status   Specimen Description BLOOD RIGHT ARM  Final   Special Requests   Final    BOTTLES DRAWN AEROBIC AND ANAEROBIC Blood Culture adequate volume   Culture   Final    NO GROWTH 5 DAYS Performed at [facility], [address] [place], [place] [address]    Report Status 06/27/2020 FINAL  Final  Blood Culture (routine x 2)     Status: None   Collection Time: 06/22/20  9:43 AM   Specimen: BLOOD  Result Value Ref Range Status   Specimen Description BLOOD LEFT ARM  Final   Special Requests   Final    BOTTLES DRAWN AEROBIC AND ANAEROBIC Blood Culture adequate volume   Culture   Final    NO GROWTH 5 DAYS Performed at [facility], [address] [place], [place] [address]    Report Status 06/27/2020 FINAL  Final  Urine culture     Status: None   Collection Time: 06/22/20 11:23 AM   Specimen: Urine, Catheterized  Result Value Ref Range Status   Specimen Description   Final    URINE, CATHETERIZED Performed at [facility], [address] [place], [place] [address]    Special Requests   Final    NONE Performed at [facility], [address] [place], [place] [address]    Culture   Final  NO GROWTH Performed at [facility], [place] [address], [place], [place] [address]    Report Status 06/23/2020 FINAL  Final  MRSA PCR Screening     Status: None   Collection Time: 06/22/20 10:47 PM   Specimen: Nasal Mucosa; Nasopharyngeal  Result Value Ref Range Status   MRSA by PCR NEGATIVE NEGATIVE Final    Comment:        The GeneXpert MRSA Assay (FDA approved for NASAL specimens only), is one component of a comprehensive MRSA colonization surveillance program. It is not intended to diagnose MRSA infection nor to guide or monitor treatment for MRSA infections. Performed at [facility], [address], [place], [place] [address]      Time coordinating discharge: 82mins  SIGNED:   Kathie Dike, MD  [facility] 06/27/2020,  4:38 PM   If 7PM-7AM, please contact night-coverage [email or web address]

## 2020-06-27 NOTE — Discharge Instructions (Signed)
You will have to isolate until 07/13/20  COVID-19: What to Do if You Are Sick If you have a fever, cough or other symptoms, you might have COVID-19. Most people have mild illness and are able to recover at home. If you are sick:  Keep track of your symptoms.  If you have an emergency warning sign (including trouble breathing), call 911. Steps to help prevent the spread of COVID-19 if you are sick If you are sick with COVID-19 or think you might have COVID-19, follow the steps below to care for yourself and to help protect other people in your home and community. Stay home except to get medical care  Stay home. Most people with COVID-19 have mild illness and can recover at home without medical care. Do not leave your home, except to get medical care. Do not visit public areas.  Take care of yourself. Get rest and stay hydrated. Take over-the-counter medicines, such as acetaminophen, to help you feel better.  Stay in touch with your doctor. Call before you get medical care. Be sure to get care if you have trouble breathing, or have any other emergency warning signs, or if you think it is an emergency.  Avoid public transportation, ride-sharing, or taxis. Separate yourself from other people As much as possible, stay in a specific room and away from other people and pets in your home. If possible, you should use a separate bathroom. If you need to be around other people or animals in or outside of the home, wear a mask. Tell your close contactsthat they may have been exposed to COVID-19. An infected person can spread COVID-19 starting 48 hours (or 2 days) before the person has any symptoms or tests positive. By letting your close contacts know they may have been exposed to COVID-19, you are helping to protect everyone.  Additional guidance is available for those living in close quarters and shared housing.  See COVID-19 and Animals if you have questions about pets.  If you are diagnosed with  COVID-19, someone from the health department may call you. Answer the call to slow the spread. Monitor your symptoms  Symptoms of COVID-19 include fever, cough, or other symptoms.  Follow care instructions from your healthcare provider and local health department. Your local health authorities may give instructions on checking your symptoms and reporting information. When to seek emergency medical attention Look for emergency warning signs* for COVID-19. If someone is showing any of these signs, seek emergency medical care immediately:  Trouble breathing  Persistent pain or pressure in the chest  New confusion  Inability to wake or stay awake  Pale, gray, or blue-colored skin, lips, or nail beds, depending on skin tone *This list is not all possible symptoms. Please call your medical provider for any other symptoms that are severe or concerning to you. Call 911 or call ahead to your local emergency facility: Notify the operator that you are seeking care for someone who has or may have COVID-19. Call ahead before visiting your doctor  Call ahead. Many medical visits for routine care are being postponed or done by phone or telemedicine.  If you have a medical appointment that cannot be postponed, call your doctor's office, and tell them you have or may have COVID-19. This will help the office protect themselves and other patients. Get  tested  If you have symptoms of COVID-19, get tested. While waiting for test results, you stay away from others, including staying apart from those living in your  household.  You can visit your state, tribal, local, and territorialhealth department's website to look for the latest local information on testing sites. If you are sick, wear a mask over your nose and mouth  You should wear a mask over your nose and mouth if you must be around other people or animals, including pets (even at home).  You don't need to wear the mask if you are alone. If you  can't put on a mask (because of trouble breathing, for example), cover your coughs and sneezes in some other way. Try to stay at least 6 feet away from other people. This will help protect the people around you.  Masks should not be placed on young children under age 29 years, anyone who has trouble breathing, or anyone who is not able to remove the mask without help. Note: During the COVID-19 pandemic, medical grade facemasks are reserved for healthcare workers and some first responders. Cover your coughs and sneezes  Cover your mouth and nose with a tissue when you cough or sneeze.  Throw away used tissues in a lined trash can.  Immediately wash your hands with soap and water for at least 20 seconds. If soap and water are not available, clean your hands with an alcohol-based hand sanitizer that contains at least 60% alcohol. Clean your hands often  Wash your hands often with soap and water for at least 20 seconds. This is especially important after blowing your nose, coughing, or sneezing; going to the bathroom; and before eating or preparing food.  Use hand sanitizer if soap and water are not available. Use an alcohol-based hand sanitizer with at least 60% alcohol, covering all surfaces of your hands and rubbing them together until they feel dry.  Soap and water are the best option, especially if hands are visibly dirty.  Avoid touching your eyes, nose, and mouth with unwashed hands.  Handwashing Tips Avoid sharing personal household items  Do not share dishes, drinking glasses, cups, eating utensils, towels, or bedding with other people in your home.  Wash these items thoroughly after using them with soap and water or put in the dishwasher. Clean all "high-touch" surfaces everyday  Clean and disinfect high-touch surfaces in your "sick room" and bathroom; wear disposable gloves. Let someone else clean and disinfect surfaces in common areas, but you should clean your bedroom and  bathroom, if possible.  If a caregiver or other person needs to clean and disinfect a sick person's bedroom or bathroom, they should do so on an as-needed basis. The caregiver/other person should wear a mask and disposable gloves prior to cleaning. They should wait as long as possible after the person who is sick has used the bathroom before coming in to clean and use the bathroom. ? High-touch surfaces include phones, remote controls, counters, tabletops, doorknobs, bathroom fixtures, toilets, keyboards, tablets, and bedside tables.  Clean and disinfect areas that may have blood, stool, or body fluids on them.  Use household cleaners and disinfectants. Clean the area or item with soap and water or another detergent if it is dirty. Then, use a household disinfectant. ? Be sure to follow the instructions on the label to ensure safe and effective use of the product. Many products recommend keeping the surface wet for several minutes to ensure germs are killed. Many also recommend precautions such as wearing gloves and making sure you have good ventilation during use of the product. ? Use a product from [employer]: Disinfectants for Coronavirus ([id number]). ?  Complete Disinfection Guidance When you can be around others after being sick with COVID-19 Deciding when you can be around others is different for different situations. Find out when you can safely end home isolation. For any additional questions about your care, contact your healthcare provider or state or local health department. 08/05/2019 Content source: [facility] for Immunization and Respiratory Diseases ([facility]), Division of Viral Diseases This information is not intended to replace advice given to you by your health care provider. Make sure you discuss any questions you have with your health care provider. Document Revised: 03/21/2020 Document Reviewed: 03/21/2020 Elsevier Patient Education  2021 [employer].

## 2020-06-28 ENCOUNTER — Telehealth: Payer: Self-pay

## 2020-06-28 NOTE — Telephone Encounter (Signed)
Transition Care Management Follow-up Telephone Call  Date of discharge and from where: 06/27/2020, [facility]   How have you been since you were released from the hospital? He said he is feeling a little bit better  Any questions or concerns? No   He did not want to talk at this time. Instructed him to call this CM back when he wished to talk.    Items Reviewed:  Did the pt receive and understand the discharge instructions provided? Yes   Medications obtained and verified? No  - he said that he does not have any medications and needs to pick them up today  Other? No   Any new allergies since your discharge? No   Do you have support at home? not assessed  Home Care and Equipment/Supplies: Were home health services ordered? no If so, what is the name of the agency? n/a  Has the agency set up a time to come to the patient's home? not applicable Were any new equipment or medical supplies ordered?  Yes: O2 What is the name of the medical supply agency? [facility] Were you able to get the supplies/equipment? yes Do you have any questions related to the use of the equipment or supplies? No   Has the O2 he said he has been using it continuously.  Functional Questionnaire: (I = Independent and D = Dependent) ADLs: not assessed.  Patient did not want to talk     Follow up appointments reviewed:   PCP Hospital f/u appt confirmed? No  He said he would call when he is ready to schedule an appointment. He noted that he has a lot going on.   [facility] f/u appt confirmed? No , none scheduled at this time  Are transportation arrangements needed? not addressed  If their condition worsens, is the pt aware to call PCP or go to the Emergency Dept.? Yes  Was the patient provided with contact information for the PCP's office or ED? Yes, he said he has the phone number  Was to pt encouraged to call back with questions or concerns? Yes

## 2020-07-08 DIAGNOSIS — Z03818 Encounter for observation for suspected exposure to other biological agents ruled out: Secondary | ICD-10-CM | POA: Diagnosis not present

## 2020-07-22 DIAGNOSIS — Z03818 Encounter for observation for suspected exposure to other biological agents ruled out: Secondary | ICD-10-CM | POA: Diagnosis not present

## 2020-08-05 DIAGNOSIS — Z03818 Encounter for observation for suspected exposure to other biological agents ruled out: Secondary | ICD-10-CM | POA: Diagnosis not present

## 2020-08-08 DIAGNOSIS — Z03818 Encounter for observation for suspected exposure to other biological agents ruled out: Secondary | ICD-10-CM | POA: Diagnosis not present

## 2020-08-10 ENCOUNTER — Telehealth (INDEPENDENT_AMBULATORY_CARE_PROVIDER_SITE_OTHER): Payer: Self-pay

## 2020-08-10 NOTE — Telephone Encounter (Signed)
Copied from [place] [id number]. Topic: General - Other >> Aug 10, 2020 11:38 AM Keene Breath wrote: Reason for CRM: Patient would like the nurse to call regarding a referral for a pain clinic.  Patient stated he did not make the appt. And tried to call them to reschedule but could not get through to the clinic. Please advise and call patient to discuss [phone]   I have contacted the patient in regards to CRM received. Patient stated that he spoke to a male and advice her that he needed a new referral to [facility] for pain management in order to be seen again.  Patient would for PCP to send a new referral.  Thank you

## 2020-08-15 ENCOUNTER — Other Ambulatory Visit (INDEPENDENT_AMBULATORY_CARE_PROVIDER_SITE_OTHER): Payer: Self-pay | Admitting: Primary Care

## 2020-08-15 DIAGNOSIS — G8929 Other chronic pain: Secondary | ICD-10-CM

## 2020-08-15 DIAGNOSIS — Z03818 Encounter for observation for suspected exposure to other biological agents ruled out: Secondary | ICD-10-CM | POA: Diagnosis not present

## 2020-08-15 DIAGNOSIS — M545 Low back pain, unspecified: Secondary | ICD-10-CM

## 2020-08-15 NOTE — Telephone Encounter (Signed)
Patient had virtual( tele) visit in November. Does he still need an appointment before referral can be placed?

## 2020-08-15 NOTE — Addendum Note (Signed)
Addended by: [name] on: 08/15/2020 04:48 PM   Modules accepted: Level of Service

## 2020-08-17 NOTE — Telephone Encounter (Signed)
Patient unsure if he should cancel his 08/23/2020 appointment with PCP because pain management reached out already and scheduled him an appointment. Patient would like a follow up call best [phone]

## 2020-08-18 NOTE — Telephone Encounter (Signed)
Patient can cancel or keep appointment that is his decision.

## 2020-08-22 DIAGNOSIS — Z03818 Encounter for observation for suspected exposure to other biological agents ruled out: Secondary | ICD-10-CM | POA: Diagnosis not present

## 2020-08-23 ENCOUNTER — Telehealth (INDEPENDENT_AMBULATORY_CARE_PROVIDER_SITE_OTHER): Payer: Self-pay | Admitting: *Deleted

## 2020-08-23 ENCOUNTER — Encounter (INDEPENDENT_AMBULATORY_CARE_PROVIDER_SITE_OTHER): Payer: Self-pay | Admitting: Primary Care

## 2020-08-23 ENCOUNTER — Other Ambulatory Visit: Payer: Self-pay

## 2020-08-23 ENCOUNTER — Ambulatory Visit (INDEPENDENT_AMBULATORY_CARE_PROVIDER_SITE_OTHER): Payer: Medicaid Other | Admitting: Primary Care

## 2020-08-23 VITALS — BP 148/90 | HR 80 | Temp 97.3°F | Ht 67.0 in | Wt 232.8 lb

## 2020-08-23 DIAGNOSIS — J449 Chronic obstructive pulmonary disease, unspecified: Secondary | ICD-10-CM

## 2020-08-23 DIAGNOSIS — R7303 Prediabetes: Secondary | ICD-10-CM | POA: Diagnosis not present

## 2020-08-23 DIAGNOSIS — D1721 Benign lipomatous neoplasm of skin and subcutaneous tissue of right arm: Secondary | ICD-10-CM

## 2020-08-23 DIAGNOSIS — M542 Cervicalgia: Secondary | ICD-10-CM

## 2020-08-23 DIAGNOSIS — F411 Generalized anxiety disorder: Secondary | ICD-10-CM

## 2020-08-23 DIAGNOSIS — I10 Essential (primary) hypertension: Secondary | ICD-10-CM | POA: Diagnosis not present

## 2020-08-23 MED ORDER — LISINOPRIL 20 MG PO TABS
20.0000 mg | ORAL_TABLET | Freq: Every day | ORAL | 1 refills | Status: DC
  Filled 2020-08-23: qty 30, 30d supply, fill #0

## 2020-08-23 MED ORDER — BUDESONIDE-FORMOTEROL FUMARATE 160-4.5 MCG/ACT IN AERO
2.0000 | INHALATION_SPRAY | Freq: Two times a day (BID) | RESPIRATORY_TRACT | 12 refills | Status: DC
  Filled 2020-08-23: qty 10.2, 30d supply, fill #0

## 2020-08-23 MED ORDER — AMLODIPINE BESYLATE 10 MG PO TABS
10.0000 mg | ORAL_TABLET | Freq: Every day | ORAL | 1 refills | Status: DC
  Filled 2020-08-23: qty 30, 30d supply, fill #0

## 2020-08-23 MED ORDER — HYDROCHLOROTHIAZIDE 25 MG PO TABS
25.0000 mg | ORAL_TABLET | Freq: Every day | ORAL | 3 refills | Status: DC
  Filled 2020-08-23: qty 30, 30d supply, fill #0

## 2020-08-23 NOTE — Patient Instructions (Addendum)
Lipoma  A lipoma is a noncancerous (benign) tumor that is made up of fat cells. This is a very common type of soft-tissue growth. Lipomas are usually found under the skin (subcutaneous). They may occur in any tissue of the body that contains fat. Common areas for lipomas to appear include the back, arms, shoulders, buttocks, and thighs. Lipomas grow slowly, and they are usually painless. Most lipomas do not cause problems and do not require treatment. What are the causes? The cause of this condition is not known. What increases the risk? You are more likely to develop this condition if:  You are 5-87 years old.  You have a family history of lipomas. What are the signs or symptoms? A lipoma usually appears as a small, round bump under the skin. In most cases, the lump will:  Feel soft or rubbery.  Not cause pain or other symptoms. However, if a lipoma is located in an area where it pushes on nerves, it can become painful or cause other symptoms. How is this diagnosed? A lipoma can usually be diagnosed with a physical exam. You may also have tests to confirm the diagnosis and to rule out other conditions. Tests may include:  Imaging tests, such as a CT scan or an MRI.  Removal of a tissue sample to be looked at under a microscope (biopsy). How is this treated? Treatment for this condition depends on the size of the lipoma and whether it is causing any symptoms.  For small lipomas that are not causing problems, no treatment is needed.  If a lipoma is bigger or it causes problems, surgery may be done to remove the lipoma. Lipomas can also be removed to improve appearance. Most often, the procedure is done after applying a medicine that numbs the area (local anesthetic).  Liposuction may be done to reduce the size of the lipoma before it is removed through surgery, or it may be done to remove the lipoma. Lipomas are removed with this method in order to limit incision size and scarring. A  liposuction tube is inserted through a small incision into the lipoma, and the contents of the lipoma are removed through the tube with suction. Follow these instructions at home:  Watch your lipoma for any changes.  Keep all follow-up visits as told by your health care provider. This is important. Contact a health care provider if:  Your lipoma becomes larger or hard.  Your lipoma becomes painful, red, or increasingly swollen. These could be signs of infection or a more serious condition. Get help right away if:  You develop tingling or numbness in an area near the lipoma. This could indicate that the lipoma is causing nerve damage. Summary  A lipoma is a noncancerous tumor that is made up of fat cells.  Most lipomas do not cause problems and do not require treatment.  If a lipoma is bigger or it causes problems, surgery may be done to remove the lipoma.  Contact a health care provider if your lipoma becomes larger or hard, or if it becomes painful, red, or increasingly swollen. Pain, redness, and swelling could be signs of infection or a more serious condition. This information is not intended to replace advice given to you by your health care provider. Make sure you discuss any questions you have with your health care provider. Document Revised: 12/22/2018 Document Reviewed: 12/22/2018 Elsevier Patient Education  2021 [place].  Insomnia Insomnia is a sleep disorder that makes it difficult to fall asleep  or stay asleep. Insomnia can cause fatigue, low energy, difficulty concentrating, mood swings, and poor performance at work or school. There are three different ways to classify insomnia:  Difficulty falling asleep.  Difficulty staying asleep.  Waking up too early in the morning. Any type of insomnia can be long-term (chronic) or short-term (acute). Both are common. Short-term insomnia usually lasts for three months or less. Chronic insomnia occurs at least three times a  week for longer than three months. What are the causes? Insomnia may be caused by another condition, situation, or substance, such as:  Anxiety.  Certain medicines.  Gastroesophageal reflux disease (GERD) or other gastrointestinal conditions.  Asthma or other breathing conditions.  Restless legs syndrome, sleep apnea, or other sleep disorders.  Chronic pain.  Menopause.  Stroke.  Abuse of alcohol, tobacco, or illegal drugs.  Mental health conditions, such as depression.  Caffeine.  Neurological disorders, such as Alzheimer's disease.  An overactive thyroid (hyperthyroidism). Sometimes, the cause of insomnia may not be known. What increases the risk? Risk factors for insomnia include:  Gender. Women are affected more often than men.  Age. Insomnia is more common as you get older.  Stress.  Lack of exercise.  Irregular work schedule or working night shifts.  Traveling between different time zones.  Certain medical and mental health conditions. What are the signs or symptoms? If you have insomnia, the main symptom is having trouble falling asleep or having trouble staying asleep. This may lead to other symptoms, such as:  Feeling fatigued or having low energy.  Feeling nervous about going to sleep.  Not feeling rested in the morning.  Having trouble concentrating.  Feeling irritable, anxious, or depressed. How is this diagnosed? This condition may be diagnosed based on:  Your symptoms and medical history. Your health care provider may ask about: ? Your sleep habits. ? Any medical conditions you have. ? Your mental health.  A physical exam. How is this treated? Treatment for insomnia depends on the cause. Treatment may focus on treating an underlying condition that is causing insomnia. Treatment may also include:  Medicines to help you sleep.  Counseling or therapy.  Lifestyle adjustments to help you sleep better. Follow these instructions at  home: Eating and drinking  Limit or avoid alcohol, caffeinated beverages, and cigarettes, especially close to bedtime. These can disrupt your sleep.  Do not eat a large meal or eat spicy foods right before bedtime. This can lead to digestive discomfort that can make it hard for you to sleep.   Sleep habits  Keep a sleep diary to help you and your health care provider figure out what could be causing your insomnia. Write down: ? When you sleep. ? When you wake up during the night. ? How well you sleep. ? How rested you feel the next day. ? Any side effects of medicines you are taking. ? What you eat and drink.  Make your bedroom a dark, comfortable place where it is easy to fall asleep. ? Put up shades or blackout curtains to block light from outside. ? Use a white noise machine to block noise. ? Keep the temperature cool.  Limit screen use before bedtime. This includes: ? Watching TV. ? Using your smartphone, tablet, or computer.  Stick to a routine that includes going to bed and waking up at the same times every day and night. This can help you fall asleep faster. Consider making a quiet activity, such as reading, part of your nighttime routine.  Try to avoid taking naps during the day so that you sleep better at night.  Get out of bed if you are still awake after 15 minutes of trying to sleep. Keep the lights down, but try reading or doing a quiet activity. When you feel sleepy, go back to bed.   General instructions  Take over-the-counter and prescription medicines only as told by your health care provider.  Exercise regularly, as told by your health care provider. Avoid exercise starting several hours before bedtime.  Use relaxation techniques to manage stress. Ask your health care provider to suggest some techniques that may work well for you. These may include: ? Breathing exercises. ? Routines to release muscle tension. ? Visualizing peaceful scenes.  Make sure that  you drive carefully. Avoid driving if you feel very sleepy.  Keep all follow-up visits as told by your health care provider. This is important. Contact a health care provider if:  You are tired throughout the day.  You have trouble in your daily routine due to sleepiness.  You continue to have sleep problems, or your sleep problems get worse. Get help right away if:  You have serious thoughts about hurting yourself or someone else. If you ever feel like you may hurt yourself or others, or have thoughts about taking your own life, get help right away. You can go to your nearest emergency department or call:  Your local emergency services (911 in the [place]  A suicide crisis helpline, such as the [place] at 225-708-7242. This is open 24 hours a day. Summary  Insomnia is a sleep disorder that makes it difficult to fall asleep or stay asleep.  Insomnia can be long-term (chronic) or short-term (acute).  Treatment for insomnia depends on the cause. Treatment may focus on treating an underlying condition that is causing insomnia.  Keep a sleep diary to help you and your health care provider figure out what could be causing your insomnia. This information is not intended to replace advice given to you by your health care provider. Make sure you discuss any questions you have with your health care provider. Document Revised: 03/17/2020 Document Reviewed: 03/17/2020 Elsevier Patient Education  2021 [employer].
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
## 2020-08-23 NOTE — Telephone Encounter (Signed)
Pt reports  pharmacy does not have amlodipine and lisinopril, prescribed today. States he needs financial assistance if available. Also would need those refills sent to [facility] on [address], "[facility]aper." After hours call. CB# [phone]

## 2020-08-26 ENCOUNTER — Other Ambulatory Visit (INDEPENDENT_AMBULATORY_CARE_PROVIDER_SITE_OTHER): Payer: Self-pay | Admitting: Primary Care

## 2020-08-26 DIAGNOSIS — I10 Essential (primary) hypertension: Secondary | ICD-10-CM

## 2020-08-26 MED ORDER — HYDROCHLOROTHIAZIDE 25 MG PO TABS: 25.0000 mg | ORAL_TABLET | Freq: Every day | ORAL | 3 refills | Status: DC

## 2020-08-26 MED ORDER — LISINOPRIL 20 MG PO TABS
20.0000 mg | ORAL_TABLET | Freq: Every day | ORAL | 1 refills | Status: DC
Start: 2020-08-26 — End: 2020-09-25

## 2020-08-28 IMAGING — DX DG CHEST 1V PORT
1 series · 1 of 1 positions shown · non-contrast
Comparison: Chest x-ray 10/11/2019

CLINICAL DATA: Shortness of breath.

EXAM:
PORTABLE CHEST 1 VIEW

[chest]
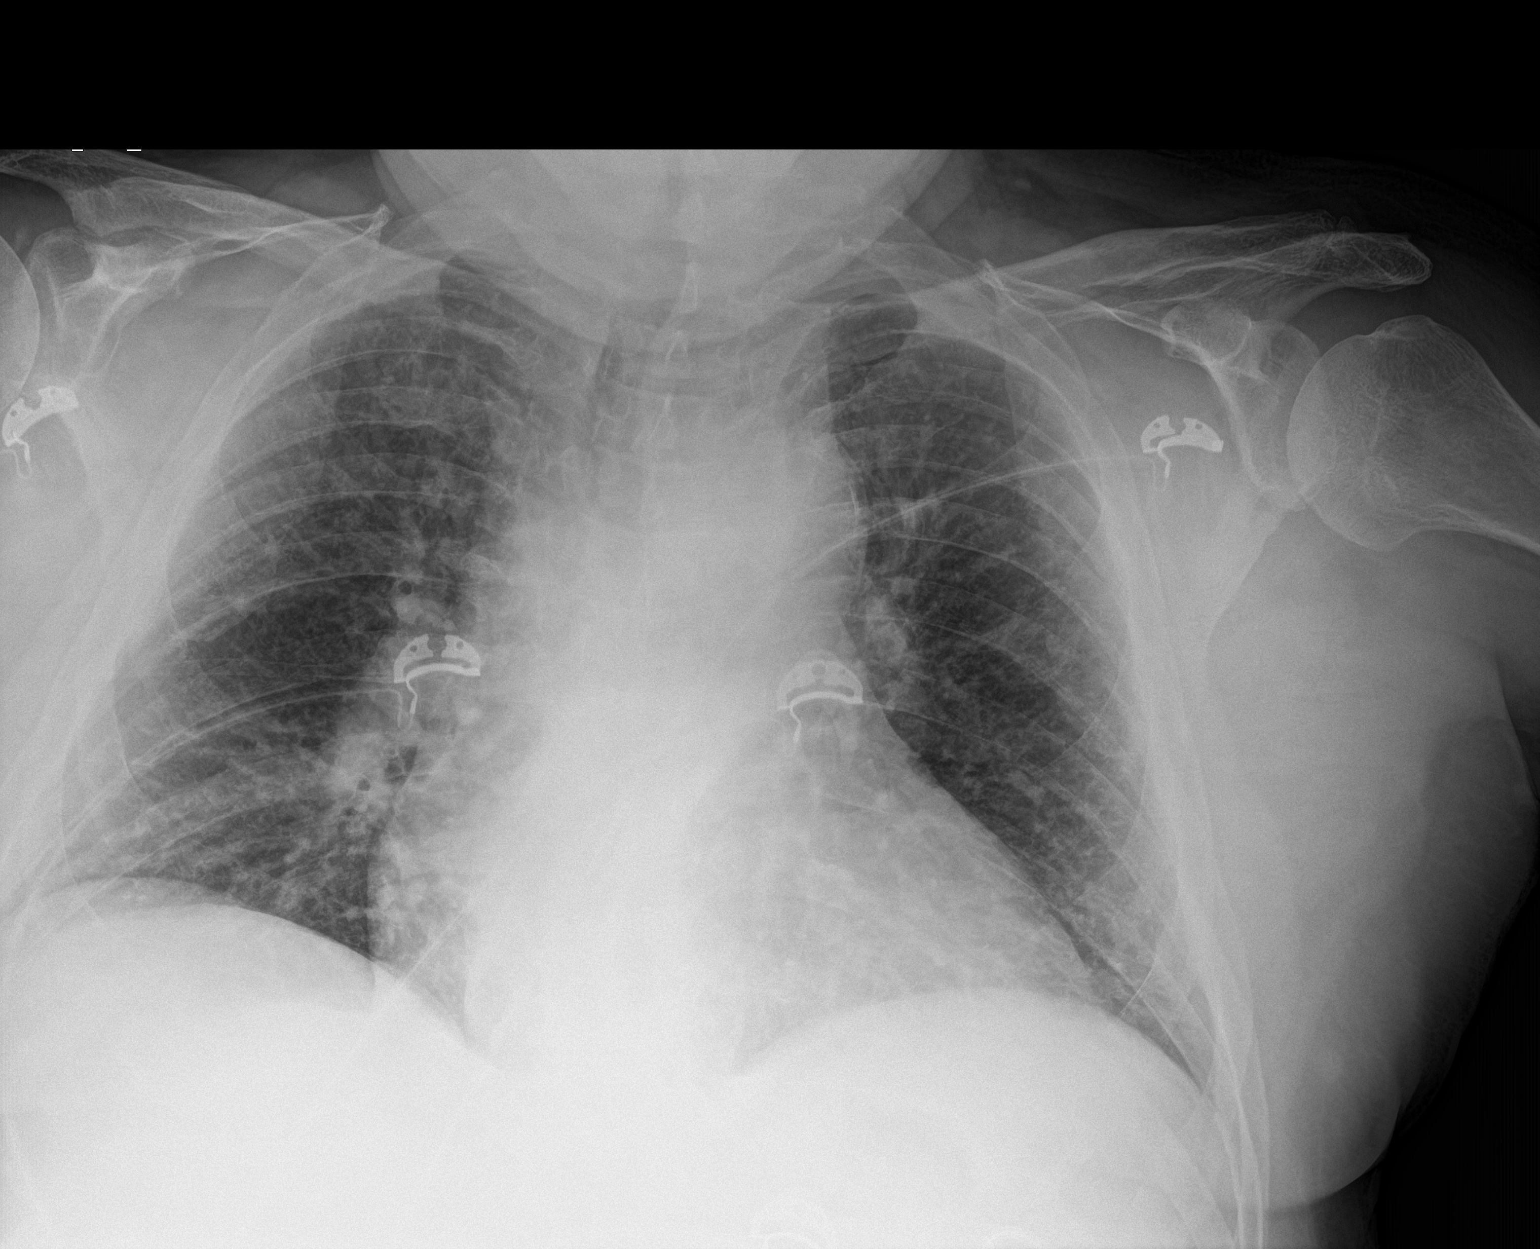

[1 of 1 positions shown; findings below may reference images not displayed]

FINDINGS: The heart is borderline enlarged but stable. There is moderate
tortuosity and calcification of the thoracic aorta. Peribronchial
thickening and increased interstitial markings suggesting
interstitial edema. No pleural effusions or focal infiltrates. No
pulmonary lesions. The bony thorax is intact.
IMPRESSION: Cardiac enlargement with interstitial edema.

## 2020-08-29 DIAGNOSIS — Z03818 Encounter for observation for suspected exposure to other biological agents ruled out: Secondary | ICD-10-CM | POA: Diagnosis not present

## 2020-08-31 DIAGNOSIS — Z79899 Other long term (current) drug therapy: Secondary | ICD-10-CM | POA: Diagnosis not present

## 2020-09-05 ENCOUNTER — Ambulatory Visit: Payer: Medicaid Other | Admitting: Family Medicine

## 2020-09-05 DIAGNOSIS — Z03818 Encounter for observation for suspected exposure to other biological agents ruled out: Secondary | ICD-10-CM | POA: Diagnosis not present

## 2020-09-12 DIAGNOSIS — Z03818 Encounter for observation for suspected exposure to other biological agents ruled out: Secondary | ICD-10-CM | POA: Diagnosis not present

## 2020-09-13 ENCOUNTER — Other Ambulatory Visit: Payer: Self-pay

## 2020-09-19 DIAGNOSIS — Z03818 Encounter for observation for suspected exposure to other biological agents ruled out: Secondary | ICD-10-CM | POA: Diagnosis not present

## 2020-09-19 DIAGNOSIS — Z79899 Other long term (current) drug therapy: Secondary | ICD-10-CM | POA: Diagnosis not present

## 2020-09-22 ENCOUNTER — Inpatient Hospital Stay (HOSPITAL_COMMUNITY): Payer: Medicaid Other

## 2020-09-22 ENCOUNTER — Inpatient Hospital Stay (HOSPITAL_COMMUNITY)
Admission: EM | Admit: 2020-09-22 | Discharge: 2020-09-25 | DRG: 189 | Disposition: A | Discharge status: Home / Self Care | Payer: Medicaid Other | Attending: Internal Medicine | Admitting: Internal Medicine

## 2020-09-22 ENCOUNTER — Encounter (HOSPITAL_COMMUNITY): Payer: Self-pay

## 2020-09-22 ENCOUNTER — Other Ambulatory Visit: Payer: Self-pay

## 2020-09-22 ENCOUNTER — Emergency Department (HOSPITAL_COMMUNITY): Payer: Medicaid Other

## 2020-09-22 DIAGNOSIS — J9602 Acute respiratory failure with hypercapnia: Principal | ICD-10-CM | POA: Diagnosis present

## 2020-09-22 DIAGNOSIS — Z5902 Unsheltered homelessness: Secondary | ICD-10-CM

## 2020-09-22 DIAGNOSIS — Z8249 Family history of ischemic heart disease and other diseases of the circulatory system: Secondary | ICD-10-CM | POA: Diagnosis not present

## 2020-09-22 DIAGNOSIS — I110 Hypertensive heart disease with heart failure: Secondary | ICD-10-CM | POA: Diagnosis present

## 2020-09-22 DIAGNOSIS — Z79899 Other long term (current) drug therapy: Secondary | ICD-10-CM

## 2020-09-22 DIAGNOSIS — F1410 Cocaine abuse, uncomplicated: Secondary | ICD-10-CM | POA: Diagnosis not present

## 2020-09-22 DIAGNOSIS — J189 Pneumonia, unspecified organism: Secondary | ICD-10-CM | POA: Diagnosis present

## 2020-09-22 DIAGNOSIS — E871 Hypo-osmolality and hyponatremia: Secondary | ICD-10-CM | POA: Diagnosis present

## 2020-09-22 DIAGNOSIS — M545 Low back pain, unspecified: Secondary | ICD-10-CM | POA: Diagnosis present

## 2020-09-22 DIAGNOSIS — Z20822 Contact with and (suspected) exposure to covid-19: Secondary | ICD-10-CM | POA: Diagnosis present

## 2020-09-22 DIAGNOSIS — J80 Acute respiratory distress syndrome: Secondary | ICD-10-CM | POA: Diagnosis not present

## 2020-09-22 DIAGNOSIS — J449 Chronic obstructive pulmonary disease, unspecified: Secondary | ICD-10-CM

## 2020-09-22 DIAGNOSIS — Z79890 Hormone replacement therapy (postmenopausal): Secondary | ICD-10-CM

## 2020-09-22 DIAGNOSIS — Z8701 Personal history of pneumonia (recurrent): Secondary | ICD-10-CM | POA: Diagnosis not present

## 2020-09-22 DIAGNOSIS — I517 Cardiomegaly: Secondary | ICD-10-CM | POA: Diagnosis not present

## 2020-09-22 DIAGNOSIS — J441 Chronic obstructive pulmonary disease with (acute) exacerbation: Secondary | ICD-10-CM | POA: Diagnosis present

## 2020-09-22 DIAGNOSIS — F17210 Nicotine dependence, cigarettes, uncomplicated: Secondary | ICD-10-CM | POA: Diagnosis present

## 2020-09-22 DIAGNOSIS — Z7151 Drug abuse counseling and surveillance of drug abuser: Secondary | ICD-10-CM

## 2020-09-22 DIAGNOSIS — E876 Hypokalemia: Secondary | ICD-10-CM | POA: Diagnosis present

## 2020-09-22 DIAGNOSIS — R9389 Abnormal findings on diagnostic imaging of other specified body structures: Secondary | ICD-10-CM | POA: Diagnosis not present

## 2020-09-22 DIAGNOSIS — I5033 Acute on chronic diastolic (congestive) heart failure: Secondary | ICD-10-CM | POA: Diagnosis present

## 2020-09-22 DIAGNOSIS — E669 Obesity, unspecified: Secondary | ICD-10-CM | POA: Diagnosis not present

## 2020-09-22 DIAGNOSIS — Z720 Tobacco use: Secondary | ICD-10-CM | POA: Diagnosis not present

## 2020-09-22 DIAGNOSIS — R0602 Shortness of breath: Secondary | ICD-10-CM | POA: Diagnosis not present

## 2020-09-22 DIAGNOSIS — I10 Essential (primary) hypertension: Secondary | ICD-10-CM | POA: Diagnosis present

## 2020-09-22 DIAGNOSIS — Z823 Family history of stroke: Secondary | ICD-10-CM | POA: Diagnosis not present

## 2020-09-22 DIAGNOSIS — G8929 Other chronic pain: Secondary | ICD-10-CM | POA: Diagnosis present

## 2020-09-22 DIAGNOSIS — Z6834 Body mass index (BMI) 34.0-34.9, adult: Secondary | ICD-10-CM

## 2020-09-22 DIAGNOSIS — I5031 Acute diastolic (congestive) heart failure: Secondary | ICD-10-CM | POA: Diagnosis not present

## 2020-09-22 DIAGNOSIS — J9601 Acute respiratory failure with hypoxia: Secondary | ICD-10-CM | POA: Diagnosis present

## 2020-09-22 DIAGNOSIS — R0689 Other abnormalities of breathing: Secondary | ICD-10-CM | POA: Diagnosis not present

## 2020-09-22 DIAGNOSIS — R069 Unspecified abnormalities of breathing: Secondary | ICD-10-CM | POA: Diagnosis not present

## 2020-09-22 DIAGNOSIS — Z8616 Personal history of COVID-19: Secondary | ICD-10-CM

## 2020-09-22 DIAGNOSIS — Z880 Allergy status to penicillin: Secondary | ICD-10-CM | POA: Diagnosis not present

## 2020-09-22 DIAGNOSIS — R0902 Hypoxemia: Secondary | ICD-10-CM | POA: Diagnosis not present

## 2020-09-22 LAB — RESP PANEL BY RT-PCR (FLU A&B, COVID) ARPGX2
Influenza A by PCR: NEGATIVE
Influenza B by PCR: NEGATIVE
SARS Coronavirus 2 by RT PCR: NEGATIVE

## 2020-09-22 LAB — COMPREHENSIVE METABOLIC PANEL
ALT: 48 U/L — ABNORMAL HIGH (ref 0–44)
AST: 39 U/L (ref 15–41)
Albumin: 3.8 g/dL (ref 3.5–5.0)
Alkaline Phosphatase: 58 U/L (ref 38–126)
Anion gap: 5 (ref 5–15)
BUN: 5 mg/dL — ABNORMAL LOW (ref 6–20)
CO2: 32 mmol/L (ref 22–32)
Calcium: 9 mg/dL (ref 8.9–10.3)
Chloride: 98 mmol/L (ref 98–111)
Creatinine, Ser: 0.82 mg/dL (ref 0.61–1.24)
GFR, Estimated: >60 mL/min (ref >60)
Glucose, Bld: 142 mg/dL — ABNORMAL HIGH (ref 70–99)
Potassium: 3.8 mmol/L (ref 3.5–5.1)
Sodium: 135 mmol/L (ref 135–145)
Total Bilirubin: 1.3 mg/dL — ABNORMAL HIGH (ref 0.3–1.2)
Total Protein: 7.3 g/dL (ref 6.5–8.1)

## 2020-09-22 LAB — CBC WITH DIFFERENTIAL/PLATELET
Abs Immature Granulocytes: 0.07 10*3/uL (ref 0.00–0.07)
Basophils Absolute: 0 10*3/uL (ref 0.0–0.1)
Basophils Relative: 0 %
Eosinophils Absolute: 0.2 10*3/uL (ref 0.0–0.5)
Eosinophils Relative: 2 %
HCT: 43.4 % (ref 39.0–52.0)
Hemoglobin: 15.2 g/dL (ref 13.0–17.0)
Immature Granulocytes: 1 %
Lymphocytes Relative: 33 %
Lymphs Abs: 3 10*3/uL (ref 0.7–4.0)
MCH: 35.9 pg — ABNORMAL HIGH (ref 26.0–34.0)
MCHC: 35 g/dL (ref 30.0–36.0)
MCV: 102.6 fL — ABNORMAL HIGH (ref 80.0–100.0)
Monocytes Absolute: 0.8 10*3/uL (ref 0.1–1.0)
Monocytes Relative: 8 %
Neutro Abs: 5.1 10*3/uL (ref 1.7–7.7)
Neutrophils Relative %: 56 %
Platelets: 224 10*3/uL (ref 150–400)
RBC: 4.23 MIL/uL (ref 4.22–5.81)
RDW: 13.2 % (ref 11.5–15.5)
WBC: 9.2 10*3/uL (ref 4.0–10.5)
nRBC: 0 % (ref 0.0–0.2)

## 2020-09-22 LAB — TROPONIN I (HIGH SENSITIVITY)
Troponin I (High Sensitivity): 15 ng/L (ref <18)
Troponin I (High Sensitivity): 16 ng/L (ref <18)

## 2020-09-22 LAB — I-STAT ARTERIAL BLOOD GAS, ED
Acid-Base Excess: 4 mmol/L — ABNORMAL HIGH (ref 0.0–2.0)
Acid-Base Excess: 5 mmol/L — ABNORMAL HIGH (ref 0.0–2.0)
Bicarbonate: 32.4 mmol/L — ABNORMAL HIGH (ref 20.0–28.0)
Bicarbonate: 32.8 mmol/L — ABNORMAL HIGH (ref 20.0–28.0)
Calcium, Ion: 1.22 mmol/L (ref 1.15–1.40)
Calcium, Ion: 1.2 mmol/L (ref 1.15–1.40)
HCT: 42 % (ref 39.0–52.0)
HCT: 42 % (ref 39.0–52.0)
Hemoglobin: 14.3 g/dL (ref 13.0–17.0)
Hemoglobin: 14.3 g/dL (ref 13.0–17.0)
O2 Saturation: 96 %
O2 Saturation: 99 %
Patient temperature: 98.6
Patient temperature: 98.6
Potassium: 3.4 mmol/L — ABNORMAL LOW (ref 3.5–5.1)
Potassium: 3.7 mmol/L (ref 3.5–5.1)
Sodium: 137 mmol/L (ref 135–145)
Sodium: 137 mmol/L (ref 135–145)
TCO2: 34 mmol/L — ABNORMAL HIGH (ref 22–32)
TCO2: 35 mmol/L — ABNORMAL HIGH (ref 22–32)
pCO2 arterial: 62.5 mmHg — ABNORMAL HIGH (ref 32.0–48.0)
pCO2 arterial: 60.7 mmHg — ABNORMAL HIGH (ref 32.0–48.0)
pH, Arterial: 7.323 — ABNORMAL LOW (ref 7.350–7.450)
pH, Arterial: 7.34 — ABNORMAL LOW (ref 7.350–7.450)
pO2, Arterial: 88 mmHg (ref 83.0–108.0)
pO2, Arterial: 163 mmHg — ABNORMAL HIGH (ref 83.0–108.0)

## 2020-09-22 LAB — RAPID URINE DRUG SCREEN, HOSP PERFORMED
Amphetamines: NOT DETECTED
Barbiturates: NOT DETECTED
Benzodiazepines: NOT DETECTED
Cocaine: POSITIVE — AB
Opiates: NOT DETECTED
Tetrahydrocannabinol: NOT DETECTED

## 2020-09-22 LAB — PROCALCITONIN: Procalcitonin: <0.1 ng/mL

## 2020-09-22 LAB — ETHANOL: Alcohol, Ethyl (B): <10 mg/dL (ref <10)

## 2020-09-22 LAB — BRAIN NATRIURETIC PEPTIDE: B Natriuretic Peptide: 103.5 pg/mL — ABNORMAL HIGH (ref 0.0–100.0)

## 2020-09-22 LAB — AMMONIA: Ammonia: 32 umol/L (ref 9–35)

## 2020-09-22 MED ORDER — NICOTINE 7 MG/24HR TD PT24
7.0000 mg | MEDICATED_PATCH | Freq: Every day | TRANSDERMAL | Status: DC
  Administered 2020-09-22 – 2020-09-25 (×4): 7 mg via TRANSDERMAL
  Filled 2020-09-22 (×4): qty 1

## 2020-09-22 MED ORDER — ARFORMOTEROL TARTRATE 15 MCG/2ML IN NEBU
15.0000 ug | INHALATION_SOLUTION | Freq: Two times a day (BID) | RESPIRATORY_TRACT | Status: DC
  Administered 2020-09-22 – 2020-09-25 (×7): 15 ug via RESPIRATORY_TRACT
  Filled 2020-09-22 (×8): qty 2

## 2020-09-22 MED ORDER — IPRATROPIUM-ALBUTEROL 0.5-2.5 (3) MG/3ML IN SOLN
3.0000 mL | Freq: Once | RESPIRATORY_TRACT | Status: AC
Start: 2020-09-22 — End: 2020-09-22
  Administered 2020-09-22: 3 mL via RESPIRATORY_TRACT
  Filled 2020-09-22: qty 3

## 2020-09-22 MED ORDER — FOLIC ACID 1 MG PO TABS
1.0000 mg | ORAL_TABLET | Freq: Every day | ORAL | Status: DC
  Administered 2020-09-22 – 2020-09-24 (×3): 1 mg via ORAL
  Filled 2020-09-22 (×3): qty 1

## 2020-09-22 MED ORDER — ADULT MULTIVITAMIN W/MINERALS CH
1.0000 | ORAL_TABLET | Freq: Every day | ORAL | Status: DC
  Administered 2020-09-23 – 2020-09-24 (×2): 1 via ORAL
  Filled 2020-09-22 (×2): qty 1

## 2020-09-22 MED ORDER — IPRATROPIUM-ALBUTEROL 0.5-2.5 (3) MG/3ML IN SOLN
3.0000 mL | Freq: Four times a day (QID) | RESPIRATORY_TRACT | Status: DC
  Administered 2020-09-22 (×2): 3 mL via RESPIRATORY_TRACT
  Filled 2020-09-22 (×2): qty 3

## 2020-09-22 MED ORDER — LORAZEPAM 1 MG PO TABS
1.0000 mg | ORAL_TABLET | ORAL | Status: AC | PRN
  Administered 2020-09-24: 2 mg via ORAL
  Administered 2020-09-24 – 2020-09-25 (×2): 1 mg via ORAL
  Filled 2020-09-22 (×2): qty 1
  Filled 2020-09-22: qty 2

## 2020-09-22 MED ORDER — ALBUTEROL SULFATE (2.5 MG/3ML) 0.083% IN NEBU: 2.5000 mg | INHALATION_SOLUTION | RESPIRATORY_TRACT | Status: DC | PRN

## 2020-09-22 MED ORDER — FUROSEMIDE 10 MG/ML IJ SOLN
40.0000 mg | Freq: Two times a day (BID) | INTRAMUSCULAR | Status: AC
  Administered 2020-09-22 (×2): 40 mg via INTRAVENOUS
  Filled 2020-09-22 (×2): qty 4

## 2020-09-22 MED ORDER — THIAMINE HCL 100 MG/ML IJ SOLN: 100.0000 mg | Freq: Every day | INTRAMUSCULAR | Status: DC

## 2020-09-22 MED ORDER — THIAMINE HCL 100 MG PO TABS
100.0000 mg | ORAL_TABLET | Freq: Every day | ORAL | Status: DC
  Administered 2020-09-22 – 2020-09-24 (×3): 100 mg via ORAL
  Filled 2020-09-22 (×3): qty 1

## 2020-09-22 MED ORDER — PREDNISONE 20 MG PO TABS
40.0000 mg | ORAL_TABLET | Freq: Every day | ORAL | Status: DC
  Administered 2020-09-23 – 2020-09-25 (×3): 40 mg via ORAL
  Filled 2020-09-22 (×3): qty 2

## 2020-09-22 MED ORDER — SODIUM CHLORIDE 0.9 % IV SOLN
1.0000 g | INTRAVENOUS | Status: DC
  Administered 2020-09-22 – 2020-09-25 (×4): 1 g via INTRAVENOUS
  Filled 2020-09-22 (×4): qty 10

## 2020-09-22 MED ORDER — BUDESONIDE 0.5 MG/2ML IN SUSP
0.5000 mg | Freq: Two times a day (BID) | RESPIRATORY_TRACT | Status: DC
  Administered 2020-09-22 – 2020-09-25 (×7): 0.5 mg via RESPIRATORY_TRACT
  Filled 2020-09-22 (×8): qty 2

## 2020-09-22 MED ORDER — AMLODIPINE BESYLATE 10 MG PO TABS
10.0000 mg | ORAL_TABLET | Freq: Every day | ORAL | Status: DC
  Administered 2020-09-22 – 2020-09-25 (×3): 10 mg via ORAL
  Filled 2020-09-22: qty 2
  Filled 2020-09-22 (×2): qty 1

## 2020-09-22 MED ORDER — IPRATROPIUM-ALBUTEROL 0.5-2.5 (3) MG/3ML IN SOLN
3.0000 mL | Freq: Three times a day (TID) | RESPIRATORY_TRACT | Status: DC
  Administered 2020-09-22 – 2020-09-23 (×2): 3 mL via RESPIRATORY_TRACT
  Filled 2020-09-22 (×2): qty 3

## 2020-09-22 MED ORDER — LISINOPRIL 20 MG PO TABS
20.0000 mg | ORAL_TABLET | Freq: Every day | ORAL | Status: DC
  Administered 2020-09-22 – 2020-09-25 (×4): 20 mg via ORAL
  Filled 2020-09-22 (×4): qty 1

## 2020-09-22 MED ORDER — LORAZEPAM 2 MG/ML IJ SOLN
1.0000 mg | INTRAMUSCULAR | Status: AC | PRN
Start: 2020-09-22 — End: 2020-09-25

## 2020-09-22 MED ORDER — GUAIFENESIN ER 600 MG PO TB12
600.0000 mg | ORAL_TABLET | Freq: Two times a day (BID) | ORAL | Status: DC
  Administered 2020-09-22 – 2020-09-23 (×2): 600 mg via ORAL
  Filled 2020-09-22 (×2): qty 1

## 2020-09-22 MED ORDER — SODIUM CHLORIDE 0.9% FLUSH
3.0000 mL | Freq: Two times a day (BID) | INTRAVENOUS | Status: DC
  Administered 2020-09-22 – 2020-09-25 (×5): 3 mL via INTRAVENOUS

## 2020-09-22 MED ORDER — ENOXAPARIN SODIUM 40 MG/0.4ML IJ SOSY
40.0000 mg | PREFILLED_SYRINGE | Freq: Every day | INTRAMUSCULAR | Status: DC
  Administered 2020-09-22 – 2020-09-24 (×2): 40 mg via SUBCUTANEOUS
  Filled 2020-09-22 (×2): qty 0.4

## 2020-09-22 MED ORDER — METHYLPREDNISOLONE SODIUM SUCC 125 MG IJ SOLR
60.0000 mg | Freq: Three times a day (TID) | INTRAMUSCULAR | Status: AC
  Administered 2020-09-22 (×3): 60 mg via INTRAVENOUS
  Filled 2020-09-22 (×3): qty 2

## 2020-09-22 NOTE — ED Triage Notes (Signed)
Pt EMS arrival from home co new onset respiratory distress, SOB x3 hrs. Hx COPD, CHF, lung sounds diminished in bilaterally, pink frothy sputum. Upon EMS arrival 90% on RA, received Duo Neb and half Albuterol neb with SPO2 96% no improvement in lung sounds. GSC15, no LOC, lethargic without stimuli  18G L AC
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
## 2020-09-22 NOTE — ED Notes (Signed)
pt taken off Bipap placed on 5L per Order to trial saturations maintaining 95% awake and alert eating
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
## 2020-09-22 NOTE — ED Notes (Signed)
Pt was able to ambulate to the restroom on 4l of o2

## 2020-09-22 NOTE — Progress Notes (Signed)
Heart Failure Nurse Navigator Progress Note  Navigation team to follow this hospitalization. Awaiting resp recovery and planned ECHO per hospitalist note. Plan to screen soon.  Pricilla Holm, RN, BSN Heart Failure Nurse Navigator [phone]

## 2020-09-23 ENCOUNTER — Inpatient Hospital Stay (HOSPITAL_COMMUNITY): Payer: Medicaid Other

## 2020-09-23 DIAGNOSIS — I5031 Acute diastolic (congestive) heart failure: Secondary | ICD-10-CM | POA: Diagnosis not present

## 2020-09-23 DIAGNOSIS — J441 Chronic obstructive pulmonary disease with (acute) exacerbation: Secondary | ICD-10-CM

## 2020-09-23 LAB — CBC
HCT: 43 % (ref 39.0–52.0)
Hemoglobin: 14.9 g/dL (ref 13.0–17.0)
MCH: 35.3 pg — ABNORMAL HIGH (ref 26.0–34.0)
MCHC: 34.7 g/dL (ref 30.0–36.0)
MCV: 101.9 fL — ABNORMAL HIGH (ref 80.0–100.0)
Platelets: 227 10*3/uL (ref 150–400)
RBC: 4.22 MIL/uL (ref 4.22–5.81)
RDW: 13.1 % (ref 11.5–15.5)
WBC: 13 10*3/uL — ABNORMAL HIGH (ref 4.0–10.5)
nRBC: 0 % (ref 0.0–0.2)

## 2020-09-23 LAB — MAGNESIUM: Magnesium: 1.9 mg/dL (ref 1.7–2.4)

## 2020-09-23 LAB — BASIC METABOLIC PANEL
Anion gap: 6 (ref 5–15)
BUN: 19 mg/dL (ref 6–20)
CO2: 31 mmol/L (ref 22–32)
Calcium: 8.9 mg/dL (ref 8.9–10.3)
Chloride: 95 mmol/L — ABNORMAL LOW (ref 98–111)
Creatinine, Ser: 0.93 mg/dL (ref 0.61–1.24)
GFR, Estimated: >60 mL/min (ref >60)
Glucose, Bld: 263 mg/dL — ABNORMAL HIGH (ref 70–99)
Potassium: 4 mmol/L (ref 3.5–5.1)
Sodium: 132 mmol/L — ABNORMAL LOW (ref 135–145)

## 2020-09-23 LAB — ECHOCARDIOGRAM LIMITED
AR max vel: 2.75 cm2
AV Area VTI: 2.62 cm2
AV Area mean vel: 2.6 cm2
AV Mean grad: 9 mmHg
AV Peak grad: 14.3 mmHg
Ao pk vel: 1.89 m/s
Area-P 1/2: 2.6 cm2
Height: 68 in
S' Lateral: 3.1 cm
Weight: 3612.02 oz

## 2020-09-23 LAB — PHOSPHORUS: Phosphorus: 3.2 mg/dL (ref 2.5–4.6)

## 2020-09-23 MED ORDER — FUROSEMIDE 40 MG PO TABS
40.0000 mg | ORAL_TABLET | Freq: Two times a day (BID) | ORAL | Status: DC
  Administered 2020-09-23 – 2020-09-25 (×5): 40 mg via ORAL
  Filled 2020-09-23 (×5): qty 1

## 2020-09-23 MED ORDER — GUAIFENESIN-DM 100-10 MG/5ML PO SYRP
5.0000 mL | ORAL_SOLUTION | ORAL | Status: DC | PRN
  Administered 2020-09-23: 5 mL via ORAL
  Filled 2020-09-23: qty 5

## 2020-09-23 MED ORDER — POTASSIUM CHLORIDE CRYS ER 20 MEQ PO TBCR
40.0000 meq | EXTENDED_RELEASE_TABLET | Freq: Once | ORAL | Status: AC
  Administered 2020-09-23: 40 meq via ORAL
  Filled 2020-09-23: qty 2

## 2020-09-23 MED ORDER — FUROSEMIDE 8 MG/ML PO SOLN: 40.0000 mg | Freq: Two times a day (BID) | ORAL | Status: DC

## 2020-09-23 MED ORDER — KETOROLAC TROMETHAMINE 15 MG/ML IJ SOLN: 15.0000 mg | Freq: Four times a day (QID) | INTRAMUSCULAR | Status: DC | PRN

## 2020-09-23 MED ORDER — PANTOPRAZOLE SODIUM 40 MG PO TBEC
40.0000 mg | DELAYED_RELEASE_TABLET | Freq: Every day | ORAL | Status: DC
  Administered 2020-09-24 – 2020-09-25 (×2): 40 mg via ORAL
  Filled 2020-09-23 (×2): qty 1

## 2020-09-23 MED ORDER — AZITHROMYCIN 250 MG PO TABS
500.0000 mg | ORAL_TABLET | Freq: Every day | ORAL | Status: DC
  Administered 2020-09-23 – 2020-09-25 (×3): 500 mg via ORAL
  Filled 2020-09-23 (×3): qty 2

## 2020-09-23 MED ORDER — ACETAMINOPHEN 325 MG PO TABS
650.0000 mg | ORAL_TABLET | Freq: Four times a day (QID) | ORAL | Status: DC | PRN
  Administered 2020-09-24: 650 mg via ORAL
  Filled 2020-09-23: qty 2

## 2020-09-23 MED ORDER — GUAIFENESIN ER 600 MG PO TB12
600.0000 mg | ORAL_TABLET | Freq: Once | ORAL | Status: AC
  Administered 2020-09-23: 600 mg via ORAL
  Filled 2020-09-23: qty 1

## 2020-09-23 MED ORDER — GUAIFENESIN ER 600 MG PO TB12
1200.0000 mg | ORAL_TABLET | Freq: Two times a day (BID) | ORAL | Status: DC
  Administered 2020-09-23 – 2020-09-25 (×4): 1200 mg via ORAL
  Filled 2020-09-23 (×4): qty 2

## 2020-09-23 NOTE — Evaluation (Signed)
Physical Therapy Evaluation Patient Details Name: [name] MRN: [id number] DOB: Dec 21, 1965 Today's Date: 09/23/2020   History of Present Illness  55 YO male presents to [facility] ED with SOB. Upon admission, O2 saturation maintained on BiPAP, now weaned to 4L [place]. CXR noted indistinct bil pulmonary infiltrates, possibly recurrent pneumonia with chronic bronchiectatic changes. PMH includes hypertension, diastolic heart failure, and polysubstance abuse(cocaine, alcohol, and tobacco).  Clinical Impression  Pt presents to PT at or near his functional baseline. Pt is able to ambulate for community distances without use of an assistive device. Pt denies SOB with activity and reports feeling ready to leave the hospital, at or near his baseline. Pt requires no further acute PT services at this time and is encouraged to ambulate multiple times each day for the remainder of admission. Acute PT signing off.    Follow Up Recommendations No PT follow up    Equipment Recommendations  None recommended by PT    Recommendations for Other Services       Precautions / Restrictions Precautions Precautions: Fall Restrictions Weight Bearing Restrictions: No      Mobility  Bed Mobility Overal bed mobility: Independent                  Transfers Overall transfer level: Independent                  Ambulation/Gait Ambulation/Gait assistance: Independent              Financial trader Rankin (Stroke Patients Only)       Balance Overall balance assessment: Independent                                           Pertinent Vitals/Pain Pain Assessment: No/denies pain    Home Living Family/patient expects to be discharged to:: Shelter/Homeless (living out of car prior to admission) Living Arrangements: Alone Available Help at Discharge:  (unable to identify any caregiver support)           Home Equipment:  None Additional Comments: pt reports recently living out of his car, does not have a plan for housing currently    Prior Function Level of Independence: Independent               Hand Dominance        Extremity/Trunk Assessment   Upper Extremity Assessment Upper Extremity Assessment: Overall WFL for tasks assessed    Lower Extremity Assessment Lower Extremity Assessment: Overall WFL for tasks assessed    Cervical / Trunk Assessment Cervical / Trunk Assessment: Normal  Communication   Communication: No difficulties  Cognition Arousal/Alertness: Awake/alert Behavior During Therapy: Agitated (mild agitation initially, pt upset about not being able to eat food when hot and initially presented due to staff interruptions) Overall Cognitive Status: Within Functional Limits for tasks assessed                                        General Comments General comments (skin integrity, edema, etc.): pt on 4L [place] upon arrival, PT weans to RA. Pt sats from 91-96% when mobilizing on RA, denies SOB, able to converse when ambulating.    Exercises     Assessment/Plan  PT Assessment Patent does not need any further PT services  PT Problem List         PT Treatment Interventions      PT Goals (Current goals can be found in the Care Plan section)       Frequency     Barriers to discharge        Co-evaluation               AM-PAC PT "6 Clicks" Mobility  Outcome Measure Help needed turning from your back to your side while in a flat bed without using bedrails?: None Help needed moving from lying on your back to sitting on the side of a flat bed without using bedrails?: None Help needed moving to and from a bed to a chair (including a wheelchair)?: None Help needed standing up from a chair using your arms (e.g., wheelchair or bedside chair)?: None Help needed to walk in hospital room?: None Help needed climbing 3-5 steps with a railing? : None 6  Click Score: 24    End of Session   Activity Tolerance: Patient tolerated treatment well Patient left: in bed;with call bell/phone within reach;with bed alarm set Nurse Communication: Mobility status      Time: 2703-5009 PT Time Calculation (min) (ACUTE ONLY): 15 min   Charges:   PT Evaluation $PT Eval Low Complexity: [place], PT, DPT Acute Rehabilitation Pager: [place] 09/23/2020, 10:39 AM

## 2020-09-23 NOTE — Evaluation (Signed)
Occupational Therapy Evaluation Patient Details Name: [name] MRN: [id number] DOB: Feb 22, 1966 Today's Date: 09/23/2020    History of Present Illness 55 YO male presents to [facility] ED with SOB. Upon admission, O2 saturation maintained on BiPAP, now weaned to 4L [place]. CXR noted indistinct bil pulmonary infiltrates, possibly recurrent pneumonia with chronic bronchiectatic changes. PMH includes hypertension, diastolic heart failure, and polysubstance abuse(cocaine, alcohol, and tobacco).   Clinical Impression   Pt is at Ind baseline level of function with ADLs and mobility. Pt denies SOB with activity and reports feeling ready to leave the hospital. All education completed and no further acute OT services are indicated at this time. OT will sign off    Follow Up Recommendations  No OT follow up    Equipment Recommendations  None recommended by OT    Recommendations for Other Services       Precautions / Restrictions Precautions Precautions: Fall Restrictions Weight Bearing Restrictions: No      Mobility Bed Mobility Overal bed mobility: Independent                  Transfers Overall transfer level: Independent                    Balance Overall balance assessment: Independent                                         ADL either performed or assessed with clinical judgement   ADL Overall ADL's : Independent;At baseline                                             Vision Patient Visual Report: No change from baseline       Perception     Praxis      Pertinent Vitals/Pain Pain Assessment: No/denies pain     Hand Dominance Right   Extremity/Trunk Assessment Upper Extremity Assessment Upper Extremity Assessment: Overall WFL for tasks assessed   Lower Extremity Assessment Lower Extremity Assessment: Defer to PT evaluation   Cervical / Trunk Assessment Cervical / Trunk Assessment: Normal   Communication  Communication Communication: No difficulties   Cognition Arousal/Alertness: Awake/alert Behavior During Therapy: WFL for tasks assessed/performed Overall Cognitive Status: Within Functional Limits for tasks assessed                                     General Comments       Exercises     Shoulder Instructions      Home Living Family/patient expects to be discharged to:: Shelter/Homeless Living Arrangements: Alone                           Home Equipment: None   Additional Comments: pt reports recently living out of his car, does not have a plan for housing currently      Prior Functioning/Environment Level of Independence: Independent                 OT Problem List: Decreased activity tolerance      OT Treatment/Interventions:      OT Goals(Current goals can be found in the care plan section) Acute Rehab  OT Goals Patient Stated Goal: get ouy of here  OT Frequency:     Barriers to D/C:            Co-evaluation              AM-PAC OT "6 Clicks" Daily Activity     Outcome Measure Help from another person eating meals?: None Help from another person taking care of personal grooming?: None Help from another person toileting, which includes using toliet, bedpan, or urinal?: None Help from another person bathing (including washing, rinsing, drying)?: None Help from another person to put on and taking off regular upper body clothing?: None Help from another person to put on and taking off regular lower body clothing?: None 6 Click Score: 24   End of Session    Activity Tolerance: Patient tolerated treatment well Patient left: in bed                   Time: 1049-1103 OT Time Calculation (min): 14 min Charges:  OT General Charges $OT Visit: 1 Visit OT Evaluation $OT Eval Low Complexity: 1 Low    Britt Bottom 09/23/2020, 2:05 PM

## 2020-09-23 NOTE — Progress Notes (Signed)
Nutrition Brief Note  RD consulted for assessment of nutritional requirements/ status.  Wt Readings from Last 15 Encounters:  09/23/20 102.4 kg  08/23/20 105.6 kg  06/25/20 93 kg  06/22/20 93.9 kg  05/15/20 99.8 kg  03/15/20 102.1 kg  03/13/20 99.8 kg  01/11/20 110 kg  11/30/19 97 kg  10/21/19 97 kg  10/17/19 99.8 kg  10/11/19 99.8 kg  03/30/19 99.8 kg  03/18/18 95.3 kg  11/11/17 87.2 kg   [name] is a 55 y.o. male with medical history significant of hypertension, diastolic heart failure, and polysubstance abuse(cocaine, alcohol, and tobacco) presents with complaints of shortness of breath.  History is somewhat limited to the patient's respiratory distress as he is on BiPAP.  Symptoms have been present for the last couple days, but worsened acutely last night.  Reports having wheezing and productive cough with whitish sputum production.  Notes associated symptoms of diarrhea, leg swelling, and abdominal distention.  He admits to drinking about 12 cans of beer yesterday evening, but reports that he is not drinking on a regular basis.  Patient still is smoking cigarettes, but reports cutting back to around 3 cigarettes/day.  He also reports that he used cocaine most recently last 1 to 2 days.  Denies having any significant chest pain, nausea, or vomiting.  Pt admitted with acute respiratory failure secondary to COPD exacerbation.   Reviewed I/O's: -867 ml x 24 hours   UOP: 1.4 L x 24 hours  Pt unavailable at time of visit. Attempted to speak with pt via call to hospital room phone, however, unable to reach. RD unable to obtain further nutrition-related history or complete nutrition-focused physical exam at this time.   Reviewed wt hx; pt wt has been stable over the past 6 months.   Medications reviewed and include folic acid, thiamine, and prednisone.   Labs reviewed: Na: 132.   Body mass index is 34.33 kg/m. Patient meets criteria for obesity, class II based on current BMI.    Current diet order is heart healthy, patient is consuming approximately 100% of meals at this time. Labs and medications reviewed.   No nutrition interventions warranted at this time. If nutrition issues arise, please consult RD.   Loistine Chance, RD, LDN, [place] Registered Dietitian II Certified Diabetes Care and Education Specialist Please refer to [facility] for RD and/or RD on-call/weekend/after hours pager

## 2020-09-23 NOTE — Progress Notes (Signed)
Heart Failure Nurse Navigator Progress Note  Pt sleeping, declined interview process at this time. Will contact pt if discharged over the weekend to assess for HV TOC readiness.   Pricilla Holm, RN, BSN Heart Failure Nurse Navigator [phone]

## 2020-09-23 NOTE — Progress Notes (Addendum)
PROGRESS NOTE    [name]  ZOX:[id number] DOB: [birth year] DOA: 09/22/2020 PCP: [name], NP   Brief Narrative: 55 year old with past medical history significant for hypertension, diastolic heart failure, polysubstance abuse (cocaine, alcohol, tobacco) presented complaining of shortness of breath.  Patient presented in respiratory distress to the ED and he was placed on BiPAP.  He reports symptoms for the last several days.  He report productive cough wheezing.  He report abdominal distention, leg swelling and diarrhea.  He continues to drinks alcohol he smoke.  Evaluation in the ED patient was found to have elevated blood pressure, initial blood gas pH of 7.3 PCO2 62 and PO2 of 88.  BNP 103.  Chest x-ray pulmonary infiltrates bilaterally pneumonia versus chronic bronchiectatic changes.    Assessment & Plan:   Principal Problem:   Acute exacerbation of chronic obstructive pulmonary disease (COPD) ([place]) Active Problems:   Cocaine abuse (HCC)   Tobacco abuse   Essential hypertension   Uncontrolled hypertension   Acute on chronic diastolic CHF (congestive heart failure) (HCC)   Acute respiratory failure with hypercapnia (HCC)   History of COVID-19   Abnormal chest x-ray   Obesity (BMI 30.0-34.9)   1-Acute hypoxic hypercapnic respiratory failure; Multifactorial in the setting of COPD exacerbation questionable pneumonia and or heart failure -He was initially on BiPAP.  On my evaluation he was off of BiPAP on room air satting 96%. -He received IV Solu-Medrol transition to prednisone today. -Plan  to continue with IV Lasix.  Follow 2D echo. -Continue with nebulizer -Add guaifenesin, flutter valve  2-abnormal chest x-ray infiltrates questionable pneumonia. -Continue with IV antibiotic: ceftriaxone and azithro  3-Acute on chronic diastolic heart failure: Continue with IV Lasix Follow echo Weight:230---225  HTN; Continue with Norvasc, lisinopril Polysubstance abuse:  Counselled Monitor on CIWA  Obesity: Need lifestyle modification  Hypokalemia: Replace Hyponatremia: Monitor on Lasix    Estimated body mass index is 34.33 kg/m as calculated from the following:   Height as of this encounter: 5\' 8"  (1.727 m).   Weight as of this encounter: 102.4 kg.   DVT prophylaxis: Lovenox Code Status: Full code Family Communication: Care discussed with patient Disposition Plan:  Status is: Inpatient  Remains inpatient appropriate because:Inpatient level of care appropriate due to severity of illness   Dispo: The patient is from: Home              Anticipated d/c is to: Home              Patient currently is not medically stable to d/c.   Difficult to place patient No        Consultants:  none  Procedures:   ECHO   Antimicrobials:    Subjective: Patient still having SOB, he is off BIPAP. Feels better. Cough persist, thick phlegm.    Objective: Vitals:   09/23/20 0726 09/23/20 0825 09/23/20 0844 09/23/20 1102  BP:  117/76 (!) 146/87 138/78  Pulse:  61  64  Resp:  20  19  Temp:  98.7 F (37.1 C)  98.6 F (37 C)  TempSrc:  Oral  Oral  SpO2: 96% 97%  91%  Weight:      Height:        Intake/Output Summary (Last 24 hours) at 09/23/2020 1359 Last data filed at 09/23/2020 1226 Gross per 24 hour  Intake 1311 ml  Output 2400 ml  Net -1089 ml   Filed Weights   09/22/20 0254 09/22/20 1534 09/23/20 0413  Weight: 104.3  kg 101.1 kg 102.4 kg    Examination:  General exam: Appears calm and comfortable  Respiratory system: BL crackles.  Cardiovascular system: S1 & S2 heard, RRR. No JVD, murmurs, rubs, gallops or clicks. No pedal edema. Gastrointestinal system: Abdomen is nondistended, soft and nontender. No organomegaly or masses felt. Normal bowel sounds heard. Central nervous system: Alert and oriented. No focal neurological deficits. Extremities: Symmetric 5 x 5 power.   Data Reviewed: I have personally reviewed following labs  and imaging studies  CBC: Recent Labs  Lab 09/22/20 0252 09/22/20 0331 09/22/20 0552 09/23/20 0443  WBC 9.2  --   --  13.0*  NEUTROABS 5.1  --   --   --   HGB 15.2 14.3 14.3 14.9  HCT 43.4 42.0 42.0 43.0  MCV 102.6*  --   --  101.9*  PLT 224  --   --  850   Basic Metabolic Panel: Recent Labs  Lab 09/22/20 0252 09/22/20 0331 09/22/20 0552 09/23/20 0443  NA 135 137 137 132*  K 3.8 3.4* 3.7 4.0  CL 98  --   --  95*  CO2 32  --   --  31  GLUCOSE 142*  --   --  263*  BUN 5*  --   --  19  CREATININE 0.82  --   --  0.93  CALCIUM 9.0  --   --  8.9  MG  --   --   --  1.9  PHOS  --   --   --  3.2   GFR: Estimated Creatinine Clearance: 102.9 mL/min (by C-G formula based on SCr of 0.93 mg/dL). Liver Function Tests: Recent Labs  Lab 09/22/20 0252  AST 39  ALT 48*  ALKPHOS 58  BILITOT 1.3*  PROT 7.3  ALBUMIN 3.8   No results for input(s): LIPASE, AMYLASE in the last 168 hours. Recent Labs  Lab 09/22/20 0539  AMMONIA 32   Coagulation Profile: No results for input(s): INR, PROTIME in the last 168 hours. Cardiac Enzymes: No results for input(s): CKTOTAL, CKMB, CKMBINDEX, TROPONINI in the last 168 hours. BNP (last 3 results) No results for input(s): PROBNP in the last 8760 hours. HbA1C: No results for input(s): HGBA1C in the last 72 hours. CBG: No results for input(s): GLUCAP in the last 168 hours. Lipid Profile: No results for input(s): CHOL, HDL, LDLCALC, TRIG, CHOLHDL, LDLDIRECT in the last 72 hours. Thyroid Function Tests: No results for input(s): TSH, T4TOTAL, FREET4, T3FREE, THYROIDAB in the last 72 hours. Anemia Panel: No results for input(s): VITAMINB12, FOLATE, FERRITIN, TIBC, IRON, RETICCTPCT in the last 72 hours. Sepsis Labs: Recent Labs  Lab 09/22/20 0720  PROCALCITON <0.10    Recent Results (from the past 240 hour(s))  Resp Panel by RT-PCR (Flu A&B, Covid) Nasopharyngeal Swab     Status: None   Collection Time: 09/22/20  3:15 AM   Specimen:  Nasopharyngeal Swab; Nasopharyngeal(NP) swabs in vial transport medium  Result Value Ref Range Status   SARS Coronavirus 2 by RT PCR NEGATIVE NEGATIVE Final    Comment: (NOTE) SARS-CoV-2 target nucleic acids are NOT DETECTED.  The SARS-CoV-2 RNA is generally detectable in upper respiratory specimens during the acute phase of infection. The lowest concentration of SARS-CoV-2 viral copies this assay can detect is 138 copies/mL. A negative result does not preclude SARS-Cov-2 infection and should not be used as the sole basis for treatment or other patient management decisions. A negative result may occur with  improper specimen  collection/handling, submission of specimen other than nasopharyngeal swab, presence of viral mutation(s) within the areas targeted by this assay, and inadequate number of viral copies(<138 copies/mL). A negative result must be combined with clinical observations, patient history, and epidemiological information. The expected result is Negative.  Fact Sheet for Patients:  EntrepreneurPulse.com.au  Fact Sheet for Healthcare Providers:  IncredibleEmployment.be  This test is no t yet approved or cleared by the [place] FDA and  has been authorized for detection and/or diagnosis of SARS-CoV-2 by FDA under an Emergency Use Authorization (EUA). This EUA will remain  in effect (meaning this test can be used) for the duration of the COVID-19 declaration under Section 564(b)(1) of the Act, 21 U.S.C.section 360bbb-3(b)(1), unless the authorization is terminated  or revoked sooner.       Influenza A by PCR NEGATIVE NEGATIVE Final   Influenza B by PCR NEGATIVE NEGATIVE Final    Comment: (NOTE) The Xpert Xpress SARS-CoV-2/FLU/RSV plus assay is intended as an aid in the diagnosis of influenza from Nasopharyngeal swab specimens and should not be used as a sole basis for treatment. Nasal washings and aspirates are unacceptable for  Xpert Xpress SARS-CoV-2/FLU/RSV testing.  Fact Sheet for Patients: EntrepreneurPulse.com.au  Fact Sheet for Healthcare Providers: IncredibleEmployment.be  This test is not yet approved or cleared by the [place] FDA and has been authorized for detection and/or diagnosis of SARS-CoV-2 by FDA under an Emergency Use Authorization (EUA). This EUA will remain in effect (meaning this test can be used) for the duration of the COVID-19 declaration under Section 564(b)(1) of the Act, 21 U.S.C. section 360bbb-3(b)(1), unless the authorization is terminated or revoked.  Performed at [facility], [place] [address], [place], [place] [address]          Radiology Studies: DG Chest Port 1 View  Result Date: 09/22/2020 CLINICAL DATA:  Shortness of breath EXAM: PORTABLE CHEST 1 VIEW COMPARISON:  06/25/2020 FINDINGS: Cardiomegaly and vascular pedicle widening. Generalized airway thickening with patchy pulmonary density that is mildly improved. Aortic tortuosity. No visible effusion or pneumothorax IMPRESSION: 1. Indistinct pulmonary infiltrates bilaterally, possible recurrent pneumonia. 2. Background of chronic bronchitic markings. Electronically Signed   By: Monte Fantasia M.D.   On: 09/22/2020 04:09        Scheduled Meds: . amLODipine  10 mg Oral Daily  . arformoterol  15 mcg Nebulization BID  . budesonide (PULMICORT) nebulizer solution  0.5 mg Nebulization BID  . enoxaparin (LOVENOX) injection  40 mg Subcutaneous Daily  . folic acid  1 mg Oral Daily  . furosemide  40 mg Oral BID  . guaiFENesin  1,200 mg Oral BID  . ipratropium-albuterol  3 mL Nebulization TID  . lisinopril  20 mg Oral Daily  . multivitamin with minerals  1 tablet Oral Daily  . nicotine  7 mg Transdermal Daily  . predniSONE  40 mg Oral Q breakfast  . sodium chloride flush  3 mL Intravenous Q12H  . thiamine  100 mg Oral Daily   Or  . thiamine  100 mg Intravenous Daily    Continuous Infusions: . cefTRIAXone (ROCEPHIN)  IV 1 g (09/23/20 0802)     LOS: 1 day    Time spent: 35 minutes    Pruitt Taboada A Daniela Hernan, MD [facility]   If 7PM-7AM, please contact night-coverage [email or web address]  09/23/2020, 1:59 PM

## 2020-09-23 NOTE — Progress Notes (Signed)
  Echocardiogram 2D Echocardiogram has been performed.  [name] 09/23/2020, 11:55 AM

## 2020-09-23 NOTE — Progress Notes (Signed)
Patient refused use of BIPAP for the evening.  °

## 2020-09-24 LAB — BASIC METABOLIC PANEL
Anion gap: 8 (ref 5–15)
BUN: 20 mg/dL (ref 6–20)
CO2: 31 mmol/L (ref 22–32)
Calcium: 8.6 mg/dL — ABNORMAL LOW (ref 8.9–10.3)
Chloride: 97 mmol/L — ABNORMAL LOW (ref 98–111)
Creatinine, Ser: 0.76 mg/dL (ref 0.61–1.24)
GFR, Estimated: >60 mL/min (ref >60)
Glucose, Bld: 129 mg/dL — ABNORMAL HIGH (ref 70–99)
Potassium: 4 mmol/L (ref 3.5–5.1)
Sodium: 136 mmol/L (ref 135–145)

## 2020-09-24 MED ORDER — BUPRENORPHINE HCL-NALOXONE HCL 8-2 MG SL SUBL
1.0000 | SUBLINGUAL_TABLET | Freq: Every day | SUBLINGUAL | Status: DC | PRN
  Administered 2020-09-24 – 2020-09-25 (×2): 1 via SUBLINGUAL
  Filled 2020-09-24 (×2): qty 1

## 2020-09-24 NOTE — Significant Event (Signed)
Patient is currently Drowsy after ativan was given for withdrawal symptoms ans excessive cough. Patient is arousalble plan to restart saboxone when more alert.

## 2020-09-24 NOTE — Progress Notes (Signed)
PROGRESS NOTE    [name]  GHW:[id number] DOB: [birth year] DOA: 09/22/2020 PCP: [name], NP   Brief Narrative: 55 year old with past medical history significant for hypertension, diastolic heart failure, polysubstance abuse (cocaine, alcohol, tobacco) presented complaining of shortness of breath.  Patient presented in respiratory distress to the ED and he was placed on BiPAP.  He reports symptoms for the last several days.  He report productive cough wheezing.  He report abdominal distention, leg swelling and diarrhea.  He continues to drinks alcohol he smoke.  Evaluation in the ED patient was found to have elevated blood pressure, initial blood gas pH of 7.3 PCO2 62 and PO2 of 88.  BNP 103.  Chest x-ray pulmonary infiltrates bilaterally pneumonia versus chronic bronchiectatic changes.    Assessment & Plan:   Principal Problem:   Acute exacerbation of chronic obstructive pulmonary disease (COPD) ([place]) Active Problems:   Cocaine abuse (HCC)   Tobacco abuse   Essential hypertension   Uncontrolled hypertension   Acute on chronic diastolic CHF (congestive heart failure) (HCC)   Acute respiratory failure with hypercapnia (HCC)   History of COVID-19   Abnormal chest x-ray   Obesity (BMI 30.0-34.9)   1-Acute hypoxic hypercapnic respiratory failure; Multifactorial in the setting of COPD exacerbation questionable pneumonia and or heart failure -He was initially on BiPAP.  On my evaluation he was off of BiPAP on room air satting 96%. -He received IV Solu-Medrol transition to prednisone today. -Plan  to continue with IV Lasix.  Follow 2D echo. -Continue with nebulizer -Add guaifenesin, flutter valve  2-abnormal chest x-ray infiltrates questionable pneumonia. -Continue with IV antibiotic: ceftriaxone and azithro  3-Acute on chronic diastolic heart failure: Continue with IV Lasix Follow echo Weight:230---225  HTN; Continue with Norvasc, lisinopril Polysubstance abuse:  Counselled Monitor on CIWA  Obesity: Need lifestyle modification  Hypokalemia: Replace Hyponatremia: Monitor on Lasix  Reported he is homeless, lives in his car, Education officer, museum consulted  Estimated body mass index is 34.44 kg/m as calculated from the following:   Height as of this encounter: 5\' 8"  (1.727 m).   Weight as of this encounter: 102.7 kg.   DVT prophylaxis: Lovenox Code Status: Full code Family Communication: Care discussed with patient Disposition Plan:  Status is: Inpatient  Remains inpatient appropriate because:Inpatient level of care appropriate due to severity of illness   Dispo: The patient is from: Home              Anticipated d/c is to: Home              Patient currently is not medically stable to d/c.   Difficult to place patient No        Consultants:  none  Procedures:   ECHO   Antimicrobials:    Subjective: Patient appears drowsy, occasionally cough, denies chest pain, still have wheezing but improved He wants Suboxone for his back pain, report he takes it chronically at home   Objective: Vitals:   09/24/20 0447 09/24/20 0828 09/24/20 0846 09/24/20 0848  BP: (!) 151/99 108/60    Pulse: 63 64    Resp: (!) 25 17    Temp: 98.6 F (37 C) 98 F (36.7 C)    TempSrc: Oral Oral    SpO2: 95% 92% 94% 94%  Weight: 102.7 kg     Height:        Intake/Output Summary (Last 24 hours) at 09/24/2020 1235 Last data filed at 09/24/2020 0900 Gross per 24 hour  Intake 933.45  ml  Output 1400 ml  Net -466.55 ml   Filed Weights   09/22/20 1534 09/23/20 0413 09/24/20 0447  Weight: 101.1 kg 102.4 kg 102.7 kg    Examination:  General exam: Drowsy, but oriented x3 Respiratory system: BL scattered wheezing, not in respiratory distress Cardiovascular system: S1 & S2 heard, RRR. No JVD, murmurs, rubs, gallops or clicks. No pedal edema. Gastrointestinal system: Abdomen is nondistended, soft and nontender.  Normal bowel sounds heard. Central  nervous system: Alert and oriented. No focal neurological deficits. Extremities: Symmetric 5 x 5 power.   Data Reviewed: I have personally reviewed following labs and imaging studies  CBC: Recent Labs  Lab 09/22/20 0252 09/22/20 0331 09/22/20 0552 09/23/20 0443  WBC 9.2  --   --  13.0*  NEUTROABS 5.1  --   --   --   HGB 15.2 14.3 14.3 14.9  HCT 43.4 42.0 42.0 43.0  MCV 102.6*  --   --  101.9*  PLT 224  --   --  [id number]   Basic Metabolic Panel: Recent Labs  Lab 09/22/20 0252 09/22/20 0331 09/22/20 0552 09/23/20 0443 09/24/20 0229  NA 135 137 137 132* 136  K 3.8 3.4* 3.7 4.0 4.0  CL 98  --   --  95* 97*  CO2 32  --   --  31 31  GLUCOSE 142*  --   --  263* 129*  BUN 5*  --   --  19 20  CREATININE 0.82  --   --  0.93 0.76  CALCIUM 9.0  --   --  8.9 8.6*  MG  --   --   --  1.9  --   PHOS  --   --   --  3.2  --    GFR: Estimated Creatinine Clearance: 119.7 mL/min (by C-G formula based on SCr of 0.76 mg/dL). Liver Function Tests: Recent Labs  Lab 09/22/20 0252  AST 39  ALT 48*  ALKPHOS 58  BILITOT 1.3*  PROT 7.3  ALBUMIN 3.8   No results for input(s): LIPASE, AMYLASE in the last 168 hours. Recent Labs  Lab 09/22/20 0539  AMMONIA 32   Coagulation Profile: No results for input(s): INR, PROTIME in the last 168 hours. Cardiac Enzymes: No results for input(s): CKTOTAL, CKMB, CKMBINDEX, TROPONINI in the last 168 hours. BNP (last 3 results) No results for input(s): PROBNP in the last 8760 hours. HbA1C: No results for input(s): HGBA1C in the last 72 hours. CBG: No results for input(s): GLUCAP in the last 168 hours. Lipid Profile: No results for input(s): CHOL, HDL, LDLCALC, TRIG, CHOLHDL, LDLDIRECT in the last 72 hours. Thyroid Function Tests: No results for input(s): TSH, T4TOTAL, FREET4, T3FREE, THYROIDAB in the last 72 hours. Anemia Panel: No results for input(s): VITAMINB12, FOLATE, FERRITIN, TIBC, IRON, RETICCTPCT in the last 72 hours. Sepsis Labs: Recent  Labs  Lab 09/22/20 0720  PROCALCITON <0.10    Recent Results (from the past 240 hour(s))  Resp Panel by RT-PCR (Flu A&B, Covid) Nasopharyngeal Swab     Status: None   Collection Time: 09/22/20  3:15 AM   Specimen: Nasopharyngeal Swab; Nasopharyngeal(NP) swabs in vial transport medium  Result Value Ref Range Status   SARS Coronavirus 2 by RT PCR NEGATIVE NEGATIVE Final    Comment: (NOTE) SARS-CoV-2 target nucleic acids are NOT DETECTED.  The SARS-CoV-2 RNA is generally detectable in upper respiratory specimens during the acute phase of infection. The lowest concentration of SARS-CoV-2 viral copies this  assay can detect is 138 copies/mL. A negative result does not preclude SARS-Cov-2 infection and should not be used as the sole basis for treatment or other patient management decisions. A negative result may occur with  improper specimen collection/handling, submission of specimen other than nasopharyngeal swab, presence of viral mutation(s) within the areas targeted by this assay, and inadequate number of viral copies(<138 copies/mL). A negative result must be combined with clinical observations, patient history, and epidemiological information. The expected result is Negative.  Fact Sheet for Patients:  EntrepreneurPulse.com.au  Fact Sheet for Healthcare Providers:  IncredibleEmployment.be  This test is no t yet approved or cleared by the [place] FDA and  has been authorized for detection and/or diagnosis of SARS-CoV-2 by FDA under an Emergency Use Authorization (EUA). This EUA will remain  in effect (meaning this test can be used) for the duration of the COVID-19 declaration under Section 564(b)(1) of the Act, 21 U.S.C.section 360bbb-3(b)(1), unless the authorization is terminated  or revoked sooner.       Influenza A by PCR NEGATIVE NEGATIVE Final   Influenza B by PCR NEGATIVE NEGATIVE Final    Comment: (NOTE) The Xpert Xpress  SARS-CoV-2/FLU/RSV plus assay is intended as an aid in the diagnosis of influenza from Nasopharyngeal swab specimens and should not be used as a sole basis for treatment. Nasal washings and aspirates are unacceptable for Xpert Xpress SARS-CoV-2/FLU/RSV testing.  Fact Sheet for Patients: EntrepreneurPulse.com.au  Fact Sheet for Healthcare Providers: IncredibleEmployment.be  This test is not yet approved or cleared by the [place] FDA and has been authorized for detection and/or diagnosis of SARS-CoV-2 by FDA under an Emergency Use Authorization (EUA). This EUA will remain in effect (meaning this test can be used) for the duration of the COVID-19 declaration under Section 564(b)(1) of the Act, 21 U.S.C. section 360bbb-3(b)(1), unless the authorization is terminated or revoked.  Performed at [facility], [place] [address], [place], [place] [address]          Radiology Studies: ECHOCARDIOGRAM LIMITED  Result Date: 09/23/2020    ECHOCARDIOGRAM LIMITED REPORT   Patient Name:   [name] Date of Exam: 09/23/2020 Medical Rec #:  [id number]     Height:       68.0 in Accession #:    [id number]    Weight:       225.7 lb Date of Birth:  02/13/1966     BSA:          2.152 m Patient Age:    60 years      BP:           146/87 mmHg Patient Gender: M             HR:           60 bpm. Exam Location:  Inpatient Procedure: Limited Echo, Cardiac Doppler and Color Doppler Indications:    CHF-Acute Diastolic  History:        Patient has prior history of Echocardiogram examinations, most                 recent 10/20/2019. CHF, COPD; Risk Factors:Current Smoker and                 Hypertension. Polysubstance abuse. Hx COVID-19.  Sonographer:    Clayton Lefort RDCS (AE) Referring Phys: [id number] RONDELL A SMITH IMPRESSIONS  1. Left ventricular ejection fraction, by estimation, is 65 to 70%. The left ventricle has normal function. There is mild concentric left  ventricular  hypertrophy. Left ventricular diastolic parameters are consistent with Grade I diastolic dysfunction (impaired relaxation).  2. Right ventricular systolic function is normal. The right ventricular size is normal. Tricuspid regurgitation signal is inadequate for assessing PA pressure.  3. The mitral valve is grossly normal. No evidence of mitral valve regurgitation. No evidence of mitral stenosis.  4. The aortic valve is tricuspid. There is mild calcification of the aortic valve. There is mild thickening of the aortic valve. Aortic valve regurgitation is not visualized. Mild aortic valve sclerosis is present, with no evidence of aortic valve stenosis.  5. There is mild dilatation of the ascending aorta, measuring 42 mm.  6. The inferior vena cava is normal in size with greater than 50% respiratory variability, suggesting right atrial pressure of 3 mmHg. Comparison(s): Changes from prior study are noted. EF is now 65-70%. FINDINGS  Left Ventricle: Left ventricular ejection fraction, by estimation, is 65 to 70%. The left ventricle has normal function. The left ventricular internal cavity size was normal in size. There is mild concentric left ventricular hypertrophy. Left ventricular diastolic parameters are consistent with Grade I diastolic dysfunction (impaired relaxation). Right Ventricle: The right ventricular size is normal. No increase in right ventricular wall thickness. Right ventricular systolic function is normal. Tricuspid regurgitation signal is inadequate for assessing PA pressure. Pericardium: Trivial pericardial effusion is present. Presence of pericardial fat pad. Mitral Valve: The mitral valve is grossly normal. No evidence of mitral valve stenosis. Tricuspid Valve: The tricuspid valve is grossly normal. Tricuspid valve regurgitation is trivial. No evidence of tricuspid stenosis. Aortic Valve: The aortic valve is tricuspid. There is mild calcification of the aortic valve. There is mild thickening of the  aortic valve. Aortic valve regurgitation is not visualized. Mild aortic valve sclerosis is present, with no evidence of aortic valve stenosis. Aortic valve mean gradient measures 9.0 mmHg. Aortic valve peak gradient measures 14.3 mmHg. Aortic valve area, by VTI measures 2.62 cm. Pulmonic Valve: The pulmonic valve was grossly normal. Pulmonic valve regurgitation is not visualized. No evidence of pulmonic stenosis. Aorta: The aortic root is normal in size and structure. There is mild dilatation of the ascending aorta, measuring 42 mm. Venous: The inferior vena cava is normal in size with greater than 50% respiratory variability, suggesting right atrial pressure of 3 mmHg. LEFT VENTRICLE PLAX 2D LVIDd:         4.80 cm  Diastology LVIDs:         3.10 cm  LV e' medial:    4.46 cm/s LV PW:         1.29 cm  LV E/e' medial:  16.2 LV IVS:        1.20 cm  LV e' lateral:   7.29 cm/s LVOT diam:     2.10 cm  LV E/e' lateral: 9.9 LV SV:         91 LV SV Index:   42 LVOT Area:     3.46 cm  IVC IVC diam: 2.10 cm LEFT ATRIUM         Index LA diam:    4.20 cm 1.95 cm/m  AORTIC VALVE AV Area (Vmax):    2.75 cm AV Area (Vmean):   2.60 cm AV Area (VTI):     2.62 cm AV Vmax:           189.00 cm/s AV Vmean:          140.000 cm/s AV VTI:  0.349 m AV Peak Grad:      14.3 mmHg AV Mean Grad:      9.0 mmHg LVOT Vmax:         150.00 cm/s LVOT Vmean:        105.000 cm/s LVOT VTI:          0.264 m LVOT/AV VTI ratio: 0.76  AORTA Ao Root diam: 3.90 cm Ao Asc diam:  4.20 cm MITRAL VALVE MV Area (PHT): 2.60 cm    SHUNTS MV Decel Time: 292 msec    Systemic VTI:  0.26 m MV E velocity: 72.40 cm/s  Systemic Diam: 2.10 cm MV A velocity: 70.70 cm/s MV E/A ratio:  1.02 Eleonore Chiquito MD Electronically signed by Eleonore Chiquito MD Signature Date/Time: 09/23/2020/3:08:08 PM    Final         Scheduled Meds: . amLODipine  10 mg Oral Daily  . arformoterol  15 mcg Nebulization BID  . azithromycin  500 mg Oral Daily  . budesonide (PULMICORT)  nebulizer solution  0.5 mg Nebulization BID  . enoxaparin (LOVENOX) injection  40 mg Subcutaneous Daily  . folic acid  1 mg Oral Daily  . furosemide  40 mg Oral BID  . guaiFENesin  1,200 mg Oral BID  . lisinopril  20 mg Oral Daily  . multivitamin with minerals  1 tablet Oral Daily  . nicotine  7 mg Transdermal Daily  . pantoprazole  40 mg Oral Daily  . predniSONE  40 mg Oral Q breakfast  . sodium chloride flush  3 mL Intravenous Q12H  . thiamine  100 mg Oral Daily   Or  . thiamine  100 mg Intravenous Daily   Continuous Infusions: . cefTRIAXone (ROCEPHIN)  IV 1 g (09/24/20 0852)     LOS: 2 days    Time spent: 15 minutes    Florencia Reasons, MD PhD FACP [facility]   If 7PM-7AM, please contact night-coverage [email or web address]  09/24/2020, 12:35 PM

## 2020-09-25 MED ORDER — FUROSEMIDE 40 MG PO TABS: 40.0000 mg | ORAL_TABLET | Freq: Two times a day (BID) | ORAL | 0 refills | Status: DC

## 2020-09-25 MED ORDER — POTASSIUM CHLORIDE CRYS ER 10 MEQ PO TBCR: 10.0000 meq | EXTENDED_RELEASE_TABLET | Freq: Every day | ORAL | 0 refills | Status: DC

## 2020-09-25 MED ORDER — PREDNISONE 20 MG PO TABS: 40.0000 mg | ORAL_TABLET | Freq: Every day | ORAL | 0 refills | Status: DC

## 2020-09-25 MED ORDER — AZITHROMYCIN 250 MG PO TABS: 250.0000 mg | ORAL_TABLET | Freq: Every day | ORAL | 0 refills | Status: DC

## 2020-09-25 MED ORDER — FOLIC ACID 1 MG PO TABS: 1.0000 mg | ORAL_TABLET | Freq: Every day | ORAL | 0 refills | Status: DC

## 2020-09-25 MED ORDER — ALBUTEROL SULFATE HFA 108 (90 BASE) MCG/ACT IN AERS
2.0000 | INHALATION_SPRAY | RESPIRATORY_TRACT | 1 refills | Status: DC | PRN
Start: 2020-09-25 — End: 2021-03-29

## 2020-09-25 MED ORDER — LISINOPRIL 40 MG PO TABS: 40.0000 mg | ORAL_TABLET | Freq: Every day | ORAL | Status: DC

## 2020-09-25 MED ORDER — GUAIFENESIN ER 600 MG PO TB12: 1200.0000 mg | ORAL_TABLET | Freq: Two times a day (BID) | ORAL | 0 refills | Status: DC

## 2020-09-25 MED ORDER — NICOTINE 7 MG/24HR TD PT24: 7.0000 mg | MEDICATED_PATCH | Freq: Every day | TRANSDERMAL | 0 refills | Status: DC

## 2020-09-25 MED ORDER — BUDESONIDE-FORMOTEROL FUMARATE 160-4.5 MCG/ACT IN AERO: 2.0000 | INHALATION_SPRAY | Freq: Two times a day (BID) | RESPIRATORY_TRACT | 12 refills | Status: DC

## 2020-09-25 MED ORDER — HYDRALAZINE HCL 25 MG PO TABS
25.0000 mg | ORAL_TABLET | Freq: Once | ORAL | Status: AC
  Administered 2020-09-25: 25 mg via ORAL
  Filled 2020-09-25: qty 1

## 2020-09-25 MED ORDER — LISINOPRIL 40 MG PO TABS: 40.0000 mg | ORAL_TABLET | Freq: Every day | ORAL | 0 refills | Status: DC

## 2020-09-25 NOTE — Progress Notes (Signed)
Paged Dr. Tyrell Antonio to make her aware that patient is asking to see her and is stating he is ready to be discharged or leave.

## 2020-09-25 NOTE — Progress Notes (Signed)
Discharge instructions given to and reviewed with patient including follow up care, changes to medications and where to pick up medicines at his pharmacy. Patient verbalized understanding of all discharge instructions. Medication from pharmacy returned to patient and patient signed for medication.Patient taken to [facility] tower front entrance by wheelchair with this RN.  Patient left hospital alert with no distress noted by [employer] with taxi voucher that was given to him by case management.

## 2020-09-25 NOTE — Discharge Summary (Addendum)
Physician Discharge Summary  [name] [id number] DOB: 07-28-1965 DOA: 09/22/2020  PCP: [name], NP  Admit date: 09/22/2020 Discharge date: 09/25/2020  Admitted From: Home  Disposition:  Home  Recommendations for Outpatient Follow-up:  1. Follow up with PCP in 1-2 weeks 2. Please obtain BMP/CBC in one week 3. Needs arrangement for BIPAP   Home Health: None  Discharge Condition: Stable.  CODE STATUS: Full code Diet recommendation: Heart Healthy    Brief/Interim Summary: 55 year old with past medical history significant for hypertension, diastolic heart failure, polysubstance abuse (cocaine, alcohol, tobacco) presented complaining of shortness of breath.  Patient presented in respiratory distress to the ED and he was placed on BiPAP.  He reports symptoms for the last several days.  He report productive cough wheezing.  He report abdominal distention, leg swelling and diarrhea.  He continues to drinks alcohol he smoke.  Evaluation in the ED patient was found to have elevated blood pressure, initial blood gas pH of 7.3 PCO2 62 and PO2 of 88.  BNP 103.  Chest x-ray pulmonary infiltrates bilaterally pneumonia versus chronic bronchiectatic changes.   1-Acute hypoxic hypercapnic respiratory failure; Multifactorial in the setting of COPD exacerbation questionable pneumonia and or heart failure -He was initially on BiPAP.  On my evaluation he was off of BiPAP on room air satting 96%. -He received IV Solu-Medrol transition to prednisone. Discharge on 2 more days of prednisone.  -Received IV lasix.  -Continue with nebulizer -Continue with  guaifenesin, flutter valve -he is breathing better, he wants to be discharge. He has job to complete tomorrow and will be able to get money for his hotel room. He does not wish to stay in the hospital to wait for BIPAP arrangement.   2-abnormal chest x-ray infiltrates questionable pneumonia. -Received IV antibiotic while in he hospital:  ceftriaxone and azithro -plan to discharge on Zithromax for 2 more days.   3-Acute on chronic diastolic heart failure: Treated  with IV Lasix. Transition to 40 mg BID>  ECHO normal EF Weight:230---225  HTN; Continue with Norvasc, lisinopril BP elevated, plan to give one dose of hydralazine prior to discharge and increase home dose of lisinopril.   Polysubstance abuse: Counselled Monitor on CIWA  Obesity: Need lifestyle modification  Hypokalemia: Replace Hyponatremia: Monitor on Lasix   Discharge Diagnoses:  Principal Problem:   Acute exacerbation of chronic obstructive pulmonary disease (COPD) ([name]) Active Problems:   Cocaine abuse (HCC)   Tobacco abuse   Essential hypertension   Uncontrolled hypertension   Acute on chronic diastolic CHF (congestive heart failure) (HCC)   Acute respiratory failure with hypercapnia (HCC)   History of COVID-19   Abnormal chest x-ray   Obesity (BMI 30.0-34.9)    Discharge Instructions  Discharge Instructions    Diet - low sodium heart healthy   Complete by: As directed    Increase activity slowly   Complete by: As directed      Allergies as of 09/25/2020      Reactions   Penicillins    From childhood: Has patient had a PCN reaction causing immediate rash, facial/tongue/throat swelling, SOB or lightheadedness with hypotension: Unknown Has patient had a PCN reaction causing severe rash involving mucus membranes or skin necrosis: Unknown Has patient had a PCN reaction that required hospitalization: Unknown Has patient had a PCN reaction occurring within the last 10 years: No If all of the above answers are "NO", then may proceed with Cephalosporin use.      Medication List  STOP taking these medications   hydrochlorothiazide 25 MG tablet Commonly known as: HYDRODIURIL     TAKE these medications   albuterol 108 (90 Base) MCG/ACT inhaler Commonly known as: VENTOLIN HFA Inhale 2 puffs into the lungs every 4 (four) hours as  needed for wheezing or shortness of breath (cough).   amLODipine 10 MG tablet Commonly known as: NORVASC Take 1 tablet (10 mg total) by mouth daily.   azithromycin 250 MG tablet Commonly known as: ZITHROMAX Take 1 tablet (250 mg total) by mouth daily.   budesonide-formoterol 160-4.5 MCG/ACT inhaler Commonly known as: Symbicort Inhale 2 puffs into the lungs in the morning and at bedtime.   folic acid 1 MG tablet Commonly known as: FOLVITE Take 1 tablet (1 mg total) by mouth daily.   furosemide 40 MG tablet Commonly known as: LASIX Take 1 tablet (40 mg total) by mouth 2 (two) times daily. What changed:   medication strength  how much to take   guaiFENesin 600 MG 12 hr tablet Commonly known as: MUCINEX Take 2 tablets (1,200 mg total) by mouth 2 (two) times daily.   lisinopril 40 MG tablet Commonly known as: ZESTRIL Take 1 tablet (40 mg total) by mouth daily. Start taking on: Sep 26, 2020 What changed:   medication strength  how much to take   nicotine 7 mg/24hr patch Commonly known as: NICODERM CQ - dosed in mg/24 hr Place 1 patch (7 mg total) onto the skin daily.   predniSONE 20 MG tablet Commonly known as: DELTASONE Take 2 tablets (40 mg total) by mouth daily with breakfast. Start taking on: Sep 26, 2020   Spiriva HandiHaler 18 MCG inhalation capsule Generic drug: tiotropium Place 1 capsule (18 mcg total) into inhaler and inhale daily.   Suboxone 8-2 MG Film Generic drug: Buprenorphine HCl-Naloxone HCl Place 1 Film under the tongue in the morning and at bedtime.       Follow-up Information    [name], NP Follow up in 1 week(s).   Specialty: Internal Medicine Contact information: [place] [address] [phone]        Dorothy Spark, MD .   Specialty: Cardiology Contact information: [place] [address] [phone]              Allergies  Allergen Reactions  .  Penicillins     From childhood: Has patient had a PCN reaction causing immediate rash, facial/tongue/throat swelling, SOB or lightheadedness with hypotension: Unknown Has patient had a PCN reaction causing severe rash involving mucus membranes or skin necrosis: Unknown Has patient had a PCN reaction that required hospitalization: Unknown Has patient had a PCN reaction occurring within the last 10 years: No If all of the above answers are "NO", then may proceed with Cephalosporin use.     Consultations:  None   Procedures/Studies: DG Chest Port 1 View  Result Date: 09/22/2020 CLINICAL DATA:  Shortness of breath EXAM: PORTABLE CHEST 1 VIEW COMPARISON:  06/25/2020 FINDINGS: Cardiomegaly and vascular pedicle widening. Generalized airway thickening with patchy pulmonary density that is mildly improved. Aortic tortuosity. No visible effusion or pneumothorax IMPRESSION: 1. Indistinct pulmonary infiltrates bilaterally, possible recurrent pneumonia. 2. Background of chronic bronchitic markings. Electronically Signed   By: Monte Fantasia M.D.   On: 09/22/2020 04:09   ECHOCARDIOGRAM LIMITED  Result Date: 09/23/2020    ECHOCARDIOGRAM LIMITED REPORT   Patient Name:   [name] Date of Exam: 09/23/2020 Medical Rec #:  [id number]     Height:       68.0 in Accession #:    [id number]    Weight:       225.7 lb Date of Birth:  April 28, 1966     BSA:          2.152 m Patient Age:    55 years      BP:           146/87 mmHg Patient Gender: M             HR:           60 bpm. Exam Location:  Inpatient Procedure: Limited Echo, Cardiac Doppler and Color Doppler Indications:    CHF-Acute Diastolic  History:        Patient has prior history of Echocardiogram examinations, most                 recent 10/20/2019. CHF, COPD; Risk Factors:Current Smoker and                 Hypertension. Polysubstance abuse. Hx COVID-19.  Sonographer:    Clayton Lefort RDCS (AE) Referring Phys: [id number] RONDELL A SMITH IMPRESSIONS  1. Left ventricular  ejection fraction, by estimation, is 65 to 70%. The left ventricle has normal function. There is mild concentric left ventricular hypertrophy. Left ventricular diastolic parameters are consistent with Grade I diastolic dysfunction (impaired relaxation).  2. Right ventricular systolic function is normal. The right ventricular size is normal. Tricuspid regurgitation signal is inadequate for assessing PA pressure.  3. The mitral valve is grossly normal. No evidence of mitral valve regurgitation. No evidence of mitral stenosis.  4. The aortic valve is tricuspid. There is mild calcification of the aortic valve. There is mild thickening of the aortic valve. Aortic valve regurgitation is not visualized. Mild aortic valve sclerosis is present, with no evidence of aortic valve stenosis.  5. There is mild dilatation of the ascending aorta, measuring 42 mm.  6. The inferior vena cava is normal in size with greater than 50% respiratory variability, suggesting right atrial pressure of 3 mmHg. Comparison(s): Changes from prior study are noted. EF is now 65-70%. FINDINGS  Left Ventricle: Left ventricular ejection fraction, by estimation, is 65 to 70%. The left ventricle has normal function. The left ventricular internal cavity size was normal in size. There is mild concentric left ventricular hypertrophy. Left ventricular diastolic parameters are consistent with Grade I diastolic dysfunction (impaired relaxation). Right Ventricle: The right ventricular size is normal. No increase in right ventricular wall thickness. Right ventricular systolic function is normal. Tricuspid regurgitation signal is inadequate for assessing PA pressure. Pericardium: Trivial pericardial effusion is present. Presence of pericardial fat pad. Mitral Valve: The mitral valve is grossly normal. No evidence of mitral valve stenosis. Tricuspid Valve: The tricuspid valve is grossly normal. Tricuspid valve regurgitation is trivial. No evidence of tricuspid  stenosis. Aortic Valve: The aortic valve is tricuspid. There is mild calcification of the aortic valve. There is mild thickening of the aortic valve. Aortic valve regurgitation is not visualized. Mild aortic valve sclerosis is present, with no evidence of aortic valve stenosis. Aortic valve mean gradient measures 9.0 mmHg. Aortic valve peak gradient measures 14.3 mmHg. Aortic valve area, by VTI measures 2.62 cm. Pulmonic Valve: The pulmonic valve was grossly normal. Pulmonic valve regurgitation is not visualized. No evidence of pulmonic stenosis. Aorta: The aortic root is normal in size and structure. There is mild dilatation of the ascending aorta, measuring 42 mm.  Venous: The inferior vena cava is normal in size with greater than 50% respiratory variability, suggesting right atrial pressure of 3 mmHg. LEFT VENTRICLE PLAX 2D LVIDd:         4.80 cm  Diastology LVIDs:         3.10 cm  LV e' medial:    4.46 cm/s LV PW:         1.29 cm  LV E/e' medial:  16.2 LV IVS:        1.20 cm  LV e' lateral:   7.29 cm/s LVOT diam:     2.10 cm  LV E/e' lateral: 9.9 LV SV:         91 LV SV Index:   42 LVOT Area:     3.46 cm  IVC IVC diam: 2.10 cm LEFT ATRIUM         Index LA diam:    4.20 cm 1.95 cm/m  AORTIC VALVE AV Area (Vmax):    2.75 cm AV Area (Vmean):   2.60 cm AV Area (VTI):     2.62 cm AV Vmax:           189.00 cm/s AV Vmean:          140.000 cm/s AV VTI:            0.349 m AV Peak Grad:      14.3 mmHg AV Mean Grad:      9.0 mmHg LVOT Vmax:         150.00 cm/s LVOT Vmean:        105.000 cm/s LVOT VTI:          0.264 m LVOT/AV VTI ratio: 0.76  AORTA Ao Root diam: 3.90 cm Ao Asc diam:  4.20 cm MITRAL VALVE MV Area (PHT): 2.60 cm    SHUNTS MV Decel Time: 292 msec    Systemic VTI:  0.26 m MV E velocity: 72.40 cm/s  Systemic Diam: 2.10 cm MV A velocity: 70.70 cm/s MV E/A ratio:  1.02 Eleonore Chiquito MD Electronically signed by Eleonore Chiquito MD Signature Date/Time: 09/23/2020/3:08:08 PM    Final      Subjective: He  Is  breathing well, wants to be discharge  Discharge Exam: Vitals:   09/25/20 0930 09/25/20 1235  BP:  (!) 151/107  Pulse:    Resp:    Temp:    SpO2: 93%      General: Pt is alert, awake, not in acute distress Cardiovascular: RRR, S1/S2 +, no rubs, no gallops Respiratory: CTA bilaterally, no wheezing, no rhonchi Abdominal: Soft, NT, ND, bowel sounds + Extremities: no edema, no cyanosis    The results of significant diagnostics from this hospitalization (including imaging, microbiology, ancillary and laboratory) are listed below for reference.     Microbiology: Recent Results (from the past 240 hour(s))  Resp Panel by RT-PCR (Flu A&B, Covid) Nasopharyngeal Swab     Status: None   Collection Time: 09/22/20  3:15 AM   Specimen: Nasopharyngeal Swab; Nasopharyngeal(NP) swabs in vial transport medium  Result Value Ref Range Status   SARS Coronavirus 2 by RT PCR NEGATIVE NEGATIVE Final    Comment: (NOTE) SARS-CoV-2 target nucleic acids are NOT DETECTED.  The SARS-CoV-2 RNA is generally detectable in upper respiratory specimens during the acute phase of infection. The lowest concentration of SARS-CoV-2 viral copies this assay can detect is 138 copies/mL. A negative result does not preclude SARS-Cov-2 infection and should not be used as the sole basis for treatment or other  patient management decisions. A negative result may occur with  improper specimen collection/handling, submission of specimen other than nasopharyngeal swab, presence of viral mutation(s) within the areas targeted by this assay, and inadequate number of viral copies(<138 copies/mL). A negative result must be combined with clinical observations, patient history, and epidemiological information. The expected result is Negative.  Fact Sheet for Patients:  EntrepreneurPulse.com.au  Fact Sheet for Healthcare Providers:  IncredibleEmployment.be  This test is no t yet approved or  cleared by the [place] FDA and  has been authorized for detection and/or diagnosis of SARS-CoV-2 by FDA under an Emergency Use Authorization (EUA). This EUA will remain  in effect (meaning this test can be used) for the duration of the COVID-19 declaration under Section 564(b)(1) of the Act, 21 U.S.C.section 360bbb-3(b)(1), unless the authorization is terminated  or revoked sooner.       Influenza A by PCR NEGATIVE NEGATIVE Final   Influenza B by PCR NEGATIVE NEGATIVE Final    Comment: (NOTE) The Xpert Xpress SARS-CoV-2/FLU/RSV plus assay is intended as an aid in the diagnosis of influenza from Nasopharyngeal swab specimens and should not be used as a sole basis for treatment. Nasal washings and aspirates are unacceptable for Xpert Xpress SARS-CoV-2/FLU/RSV testing.  Fact Sheet for Patients: EntrepreneurPulse.com.au  Fact Sheet for Healthcare Providers: IncredibleEmployment.be  This test is not yet approved or cleared by the [place] FDA and has been authorized for detection and/or diagnosis of SARS-CoV-2 by FDA under an Emergency Use Authorization (EUA). This EUA will remain in effect (meaning this test can be used) for the duration of the COVID-19 declaration under Section 564(b)(1) of the Act, 21 U.S.C. section 360bbb-3(b)(1), unless the authorization is terminated or revoked.  Performed at [facility], [place] [address], [place], [place] [address]      Labs: BNP (last 3 results) Recent Labs    03/13/20 0946 03/15/20 0922 09/22/20 0252  BNP 275.5* 189.0* [id number]*   Basic Metabolic Panel: Recent Labs  Lab 09/22/20 0252 09/22/20 0331 09/22/20 0552 09/23/20 0443 09/24/20 0229  NA 135 137 137 132* 136  K 3.8 3.4* 3.7 4.0 4.0  CL 98  --   --  95* 97*  CO2 32  --   --  31 31  GLUCOSE 142*  --   --  263* 129*  BUN 5*  --   --  19 20  CREATININE 0.82  --   --  0.93 0.76  CALCIUM 9.0  --   --  8.9 8.6*  MG  --    --   --  1.9  --   PHOS  --   --   --  3.2  --    Liver Function Tests: Recent Labs  Lab 09/22/20 0252  AST 39  ALT 48*  ALKPHOS 58  BILITOT 1.3*  PROT 7.3  ALBUMIN 3.8   No results for input(s): LIPASE, AMYLASE in the last 168 hours. Recent Labs  Lab 09/22/20 0539  AMMONIA 32   CBC: Recent Labs  Lab 09/22/20 0252 09/22/20 0331 09/22/20 0552 09/23/20 0443  WBC 9.2  --   --  13.0*  NEUTROABS 5.1  --   --   --   HGB 15.2 14.3 14.3 14.9  HCT 43.4 42.0 42.0 43.0  MCV 102.6*  --   --  101.9*  PLT 224  --   --  227   Cardiac Enzymes: No results for input(s): CKTOTAL, CKMB, CKMBINDEX, TROPONINI in the last  168 hours. BNP: Invalid input(s): POCBNP CBG: No results for input(s): GLUCAP in the last 168 hours. D-Dimer No results for input(s): DDIMER in the last 72 hours. Hgb A1c No results for input(s): HGBA1C in the last 72 hours. Lipid Profile No results for input(s): CHOL, HDL, LDLCALC, TRIG, CHOLHDL, LDLDIRECT in the last 72 hours. Thyroid function studies No results for input(s): TSH, T4TOTAL, T3FREE, THYROIDAB in the last 72 hours.  Invalid input(s): FREET3 Anemia work up No results for input(s): VITAMINB12, FOLATE, FERRITIN, TIBC, IRON, RETICCTPCT in the last 72 hours. Urinalysis    Component Value Date/Time   COLORURINE YELLOW 06/22/2020 [place] 06/22/2020 1123   LABSPEC 1.009 06/22/2020 1123   PHURINE 6.0 06/22/2020 1123   GLUCOSEU NEGATIVE 06/22/2020 1123   HGBUR SMALL (A) 06/22/2020 1123   BILIRUBINUR NEGATIVE 06/22/2020 1123   KETONESUR NEGATIVE 06/22/2020 1123   PROTEINUR NEGATIVE 06/22/2020 1123   UROBILINOGEN 1.0 10/16/2010 0217   NITRITE NEGATIVE 06/22/2020 1123   LEUKOCYTESUR NEGATIVE 06/22/2020 1123   Sepsis Labs Invalid input(s): PROCALCITONIN,  WBC,  LACTICIDVEN Microbiology Recent Results (from the past 240 hour(s))  Resp Panel by RT-PCR (Flu A&B, Covid) Nasopharyngeal Swab     Status: None   Collection Time: 09/22/20   3:15 AM   Specimen: Nasopharyngeal Swab; Nasopharyngeal(NP) swabs in vial transport medium  Result Value Ref Range Status   SARS Coronavirus 2 by RT PCR NEGATIVE NEGATIVE Final    Comment: (NOTE) SARS-CoV-2 target nucleic acids are NOT DETECTED.  The SARS-CoV-2 RNA is generally detectable in upper respiratory specimens during the acute phase of infection. The lowest concentration of SARS-CoV-2 viral copies this assay can detect is 138 copies/mL. A negative result does not preclude SARS-Cov-2 infection and should not be used as the sole basis for treatment or other patient management decisions. A negative result may occur with  improper specimen collection/handling, submission of specimen other than nasopharyngeal swab, presence of viral mutation(s) within the areas targeted by this assay, and inadequate number of viral copies(<138 copies/mL). A negative result must be combined with clinical observations, patient history, and epidemiological information. The expected result is Negative.  Fact Sheet for Patients:  EntrepreneurPulse.com.au  Fact Sheet for Healthcare Providers:  IncredibleEmployment.be  This test is no t yet approved or cleared by the [place] FDA and  has been authorized for detection and/or diagnosis of SARS-CoV-2 by FDA under an Emergency Use Authorization (EUA). This EUA will remain  in effect (meaning this test can be used) for the duration of the COVID-19 declaration under Section 564(b)(1) of the Act, 21 U.S.C.section 360bbb-3(b)(1), unless the authorization is terminated  or revoked sooner.       Influenza A by PCR NEGATIVE NEGATIVE Final   Influenza B by PCR NEGATIVE NEGATIVE Final    Comment: (NOTE) The Xpert Xpress SARS-CoV-2/FLU/RSV plus assay is intended as an aid in the diagnosis of influenza from Nasopharyngeal swab specimens and should not be used as a sole basis for treatment. Nasal washings and aspirates  are unacceptable for Xpert Xpress SARS-CoV-2/FLU/RSV testing.  Fact Sheet for Patients: EntrepreneurPulse.com.au  Fact Sheet for Healthcare Providers: IncredibleEmployment.be  This test is not yet approved or cleared by the [place] FDA and has been authorized for detection and/or diagnosis of SARS-CoV-2 by FDA under an Emergency Use Authorization (EUA). This EUA will remain in effect (meaning this test can be used) for the duration of the COVID-19 declaration under Section 564(b)(1) of the Act, 21 U.S.C. section 360bbb-3(b)(1), unless  the authorization is terminated or revoked.  Performed at [facility], [place] [address] [place], [place] [address]      Time coordinating discharge: 40 minutes  SIGNED:   Elmarie Shiley, MD  [facility]

## 2020-09-25 NOTE — Progress Notes (Signed)
Sent Dr. Tyrell Antonio a secure chat and let her know that patient refused Lovenox injection because he states he's going home. Also let MD know that patient's BP is high 141/120 and that scheduled BP meds were just given and will recheck BP prior to discharging, and let MD know that o2 sats were 93% on room air while ambulating.

## 2020-09-25 NOTE — Progress Notes (Signed)
Spoke with Dr. Tyrell Antonio and discussed patient's current blood pressure of 151/107.  MD stated that she ordered a one time dose of hydralazine and increased lisinopril dose for discharge medication. RN asked if patient needed to wait after taking hydralazine for a blood pressure recheck and MD stated "no give the hydralazine and discharge him."

## 2020-09-25 NOTE — Progress Notes (Signed)
SATURATION QUALIFICATIONS: (This note is used to comply with regulatory documentation for home oxygen)  Patient Saturations on Room Air at Rest = 90%  Patient Saturations on Room Air while Ambulating = 93%  Patient Saturations on 0 Liters of oxygen while Ambulating = does not need oxygen  Please briefly explain why patient needs home oxygen: patient doesn't need oxygen

## 2020-09-25 NOTE — TOC Transition Note (Signed)
Transition of Care [facility]) - CM/SW Discharge Note   Patient Details  Name: [name] MRN: [id number] Date of Birth: Dec 31, 1965  Transition of [facility]) CM/SW Contact:  Carles Collet, RN Phone Number: 09/25/2020, 9:29 AM   Clinical Narrative:   Met with patient at bedside. He states he needs transportation to [facility] in [place], he is staying there, and his car is there. Taxi Voucher provided.  Patient ambulated w nurse on RA and did not require home oxygen.           Patient Goals and CMS Choice        Discharge Placement                       Discharge Plan and Services                                     Social Determinants of Health (SDOH) Interventions     Readmission Risk Interventions Readmission Risk Prevention Plan 06/27/2020 10/21/2019  Transportation Screening Complete Complete  PCP or Specialist Appt within 3-5 Days - Complete  HRI or [place] - Complete  Social Work Consult for [place] Planning/Counseling - Complete  Palliative Care Screening - Complete  Medication Review Press photographer) Complete Complete  PCP or Specialist appointment within 3-5 days of discharge Complete -  [place] or Home Care Consult Complete -  SW Recovery Care/Counseling Consult Complete -  Palliative Care Screening Not Applicable -  [place] Not Applicable -  Some recent data might be hidden

## 2020-09-26 ENCOUNTER — Telehealth: Payer: Self-pay

## 2020-09-26 DIAGNOSIS — Z03818 Encounter for observation for suspected exposure to other biological agents ruled out: Secondary | ICD-10-CM | POA: Diagnosis not present

## 2020-09-26 NOTE — Telephone Encounter (Signed)
Transition Care Management Follow-up Telephone Call  Date of discharge and from where: 5/8/202, [facility]   How have you been since you were released from the hospital? He said he is feeling all right, his breathing is good.   Any questions or concerns? Yes - he is currently living in and out of his car.  He said that he stays in a motel when he can.  He is adamant about not going to a shelter.  He said he has been dealing with this issue of housing for years and understands that there are wait lists or nothing available. He then said he is on the waiting list for senior housing and is going to wait for that apartment.   Items Reviewed:  Did the pt receive and understand the discharge instructions provided? Yes   Medications obtained and verified? No - he has not picked up any of the new medications but plans to pick them up today.  He said that he has all of the other medications.   Other? Yes   Any new allergies since your discharge? No   Dietary orders reviewed? No  Do you have support at home? No  -he said he has no support in the area.   Home Care and Equipment/Supplies: Were home health services ordered? no If so, what is the name of the agency? n/a  Has the agency set up a time to come to the patient's home? not applicable Were any new equipment or medical supplies ordered?  No What is the name of the medical supply agency? n/a Were you able to get the supplies/equipment? not applicable Do you have any questions related to the use of the equipment or supplies? No  Functional Questionnaire: (I = Independent and D = Dependent) ADLs: independent. Does not need O2  Follow up appointments reviewed:   PCP Hospital f/u appt confirmed? Yes  Juluis Mire, NP  -10/18/2020. He did not want to be seen sooner. He said he has too much going on and too many appointments coming up.   [facility] f/u appt confirmed? He said that he has appointments scheduled that  are coming up but he could not remember who they are with .  Are transportation arrangements needed? No   If their condition worsens, is the pt aware to call PCP or go to the Emergency Dept.? Yes  Was the patient provided with contact information for the PCP's office or ED? Yes, he has the phone number for [facility]  Was to pt encouraged to call back with questions or concerns? Yes

## 2020-10-02 ENCOUNTER — Emergency Department (HOSPITAL_COMMUNITY): Payer: Medicaid Other

## 2020-10-02 ENCOUNTER — Encounter (HOSPITAL_COMMUNITY): Payer: Self-pay

## 2020-10-02 ENCOUNTER — Other Ambulatory Visit: Payer: Self-pay

## 2020-10-02 ENCOUNTER — Observation Stay (HOSPITAL_COMMUNITY)
Admission: EM | Admit: 2020-10-02 | Discharge: 2020-10-03 | DRG: 190 | Discharge status: Against Medical Advice | Payer: Medicaid Other | Attending: Family Medicine | Admitting: Family Medicine

## 2020-10-02 DIAGNOSIS — M50221 Other cervical disc displacement at C4-C5 level: Secondary | ICD-10-CM | POA: Diagnosis not present

## 2020-10-02 DIAGNOSIS — J9601 Acute respiratory failure with hypoxia: Secondary | ICD-10-CM | POA: Diagnosis not present

## 2020-10-02 DIAGNOSIS — F17210 Nicotine dependence, cigarettes, uncomplicated: Secondary | ICD-10-CM | POA: Diagnosis present

## 2020-10-02 DIAGNOSIS — M545 Low back pain, unspecified: Secondary | ICD-10-CM | POA: Diagnosis not present

## 2020-10-02 DIAGNOSIS — I5032 Chronic diastolic (congestive) heart failure: Secondary | ICD-10-CM | POA: Diagnosis present

## 2020-10-02 DIAGNOSIS — I10 Essential (primary) hypertension: Secondary | ICD-10-CM | POA: Diagnosis present

## 2020-10-02 DIAGNOSIS — J9622 Acute and chronic respiratory failure with hypercapnia: Secondary | ICD-10-CM | POA: Diagnosis present

## 2020-10-02 DIAGNOSIS — Z20822 Contact with and (suspected) exposure to covid-19: Secondary | ICD-10-CM | POA: Diagnosis present

## 2020-10-02 DIAGNOSIS — Z8249 Family history of ischemic heart disease and other diseases of the circulatory system: Secondary | ICD-10-CM | POA: Diagnosis not present

## 2020-10-02 DIAGNOSIS — Z79891 Long term (current) use of opiate analgesic: Secondary | ICD-10-CM

## 2020-10-02 DIAGNOSIS — F1410 Cocaine abuse, uncomplicated: Secondary | ICD-10-CM | POA: Diagnosis present

## 2020-10-02 DIAGNOSIS — Z79899 Other long term (current) drug therapy: Secondary | ICD-10-CM

## 2020-10-02 DIAGNOSIS — J9621 Acute and chronic respiratory failure with hypoxia: Secondary | ICD-10-CM | POA: Diagnosis present

## 2020-10-02 DIAGNOSIS — I517 Cardiomegaly: Secondary | ICD-10-CM | POA: Diagnosis not present

## 2020-10-02 DIAGNOSIS — S62644A Nondisplaced fracture of proximal phalanx of right ring finger, initial encounter for closed fracture: Secondary | ICD-10-CM

## 2020-10-02 DIAGNOSIS — I110 Hypertensive heart disease with heart failure: Secondary | ICD-10-CM | POA: Diagnosis present

## 2020-10-02 DIAGNOSIS — Z823 Family history of stroke: Secondary | ICD-10-CM

## 2020-10-02 DIAGNOSIS — F10120 Alcohol abuse with intoxication, uncomplicated: Secondary | ICD-10-CM | POA: Diagnosis present

## 2020-10-02 DIAGNOSIS — Z5329 Procedure and treatment not carried out because of patient's decision for other reasons: Secondary | ICD-10-CM | POA: Diagnosis not present

## 2020-10-02 DIAGNOSIS — J9602 Acute respiratory failure with hypercapnia: Secondary | ICD-10-CM

## 2020-10-02 DIAGNOSIS — J449 Chronic obstructive pulmonary disease, unspecified: Secondary | ICD-10-CM

## 2020-10-02 DIAGNOSIS — R4182 Altered mental status, unspecified: Secondary | ICD-10-CM | POA: Diagnosis not present

## 2020-10-02 DIAGNOSIS — R52 Pain, unspecified: Secondary | ICD-10-CM

## 2020-10-02 DIAGNOSIS — J441 Chronic obstructive pulmonary disease with (acute) exacerbation: Secondary | ICD-10-CM | POA: Diagnosis not present

## 2020-10-02 DIAGNOSIS — Z720 Tobacco use: Secondary | ICD-10-CM | POA: Diagnosis present

## 2020-10-02 DIAGNOSIS — E662 Morbid (severe) obesity with alveolar hypoventilation: Secondary | ICD-10-CM

## 2020-10-02 DIAGNOSIS — S62614A Displaced fracture of proximal phalanx of right ring finger, initial encounter for closed fracture: Secondary | ICD-10-CM | POA: Diagnosis not present

## 2020-10-02 DIAGNOSIS — M4319 Spondylolisthesis, multiple sites in spine: Secondary | ICD-10-CM | POA: Diagnosis not present

## 2020-10-02 DIAGNOSIS — Y909 Presence of alcohol in blood, level not specified: Secondary | ICD-10-CM | POA: Diagnosis present

## 2020-10-02 DIAGNOSIS — Z5900 Homelessness unspecified: Secondary | ICD-10-CM

## 2020-10-02 DIAGNOSIS — Y92002 Bathroom of unspecified non-institutional (private) residence single-family (private) house as the place of occurrence of the external cause: Secondary | ICD-10-CM

## 2020-10-02 DIAGNOSIS — I6529 Occlusion and stenosis of unspecified carotid artery: Secondary | ICD-10-CM | POA: Diagnosis not present

## 2020-10-02 DIAGNOSIS — Z7951 Long term (current) use of inhaled steroids: Secondary | ICD-10-CM

## 2020-10-02 DIAGNOSIS — J320 Chronic maxillary sinusitis: Secondary | ICD-10-CM | POA: Diagnosis not present

## 2020-10-02 DIAGNOSIS — Z5902 Unsheltered homelessness: Secondary | ICD-10-CM

## 2020-10-02 DIAGNOSIS — G8929 Other chronic pain: Secondary | ICD-10-CM | POA: Diagnosis not present

## 2020-10-02 DIAGNOSIS — F10920 Alcohol use, unspecified with intoxication, uncomplicated: Secondary | ICD-10-CM

## 2020-10-02 DIAGNOSIS — Z880 Allergy status to penicillin: Secondary | ICD-10-CM

## 2020-10-02 DIAGNOSIS — J3489 Other specified disorders of nose and nasal sinuses: Secondary | ICD-10-CM | POA: Diagnosis not present

## 2020-10-02 DIAGNOSIS — E66811 Obesity, class 1: Secondary | ICD-10-CM | POA: Diagnosis present

## 2020-10-02 DIAGNOSIS — S199XXA Unspecified injury of neck, initial encounter: Secondary | ICD-10-CM | POA: Diagnosis not present

## 2020-10-02 DIAGNOSIS — Z6834 Body mass index (BMI) 34.0-34.9, adult: Secondary | ICD-10-CM | POA: Diagnosis not present

## 2020-10-02 DIAGNOSIS — W1812XA Fall from or off toilet with subsequent striking against object, initial encounter: Secondary | ICD-10-CM | POA: Diagnosis not present

## 2020-10-02 DIAGNOSIS — S0990XA Unspecified injury of head, initial encounter: Secondary | ICD-10-CM | POA: Diagnosis not present

## 2020-10-02 DIAGNOSIS — E669 Obesity, unspecified: Secondary | ICD-10-CM

## 2020-10-02 LAB — COMPREHENSIVE METABOLIC PANEL
ALT: 41 U/L (ref 0–44)
AST: 38 U/L (ref 15–41)
Albumin: 3.5 g/dL (ref 3.5–5.0)
Alkaline Phosphatase: 51 U/L (ref 38–126)
Anion gap: 7 (ref 5–15)
BUN: 13 mg/dL (ref 6–20)
CO2: 29 mmol/L (ref 22–32)
Calcium: 8.9 mg/dL (ref 8.9–10.3)
Chloride: 98 mmol/L (ref 98–111)
Creatinine, Ser: 0.71 mg/dL (ref 0.61–1.24)
GFR, Estimated: >60 mL/min (ref >60)
Glucose, Bld: 157 mg/dL — ABNORMAL HIGH (ref 70–99)
Potassium: 4.4 mmol/L (ref 3.5–5.1)
Sodium: 134 mmol/L — ABNORMAL LOW (ref 135–145)
Total Bilirubin: 1.3 mg/dL — ABNORMAL HIGH (ref 0.3–1.2)
Total Protein: 6.9 g/dL (ref 6.5–8.1)

## 2020-10-02 LAB — I-STAT ARTERIAL BLOOD GAS, ED
Acid-Base Excess: 5 mmol/L — ABNORMAL HIGH (ref 0.0–2.0)
Bicarbonate: 32.6 mmol/L — ABNORMAL HIGH (ref 20.0–28.0)
Calcium, Ion: 1.22 mmol/L (ref 1.15–1.40)
HCT: 41 % (ref 39.0–52.0)
Hemoglobin: 13.9 g/dL (ref 13.0–17.0)
O2 Saturation: 94 %
Patient temperature: 99.3
Potassium: 4.3 mmol/L (ref 3.5–5.1)
Sodium: 136 mmol/L (ref 135–145)
TCO2: 34 mmol/L — ABNORMAL HIGH (ref 22–32)
pCO2 arterial: 61.8 mmHg — ABNORMAL HIGH (ref 32.0–48.0)
pH, Arterial: 7.331 — ABNORMAL LOW (ref 7.350–7.450)
pO2, Arterial: 81 mmHg — ABNORMAL LOW (ref 83.0–108.0)

## 2020-10-02 LAB — RAPID URINE DRUG SCREEN, HOSP PERFORMED
Amphetamines: NOT DETECTED
Barbiturates: NOT DETECTED
Benzodiazepines: NOT DETECTED
Cocaine: POSITIVE — AB
Opiates: NOT DETECTED
Tetrahydrocannabinol: NOT DETECTED

## 2020-10-02 LAB — RESP PANEL BY RT-PCR (FLU A&B, COVID) ARPGX2
Influenza A by PCR: NEGATIVE
Influenza B by PCR: NEGATIVE
SARS Coronavirus 2 by RT PCR: NEGATIVE

## 2020-10-02 LAB — LACTIC ACID, PLASMA: Lactic Acid, Venous: 1.3 mmol/L (ref 0.5–1.9)

## 2020-10-02 LAB — CBC
HCT: 41.6 % (ref 39.0–52.0)
Hemoglobin: 14 g/dL (ref 13.0–17.0)
MCH: 35.6 pg — ABNORMAL HIGH (ref 26.0–34.0)
MCHC: 33.7 g/dL (ref 30.0–36.0)
MCV: 105.9 fL — ABNORMAL HIGH (ref 80.0–100.0)
Platelets: 194 10*3/uL (ref 150–400)
RBC: 3.93 MIL/uL — ABNORMAL LOW (ref 4.22–5.81)
RDW: 13.7 % (ref 11.5–15.5)
WBC: 8.5 10*3/uL (ref 4.0–10.5)
nRBC: 0 % (ref 0.0–0.2)

## 2020-10-02 LAB — BRAIN NATRIURETIC PEPTIDE: B Natriuretic Peptide: 257.8 pg/mL — ABNORMAL HIGH (ref 0.0–100.0)

## 2020-10-02 LAB — ETHANOL: Alcohol, Ethyl (B): <10 mg/dL (ref <10)

## 2020-10-02 MED ORDER — NICOTINE 7 MG/24HR TD PT24
7.0000 mg | MEDICATED_PATCH | Freq: Every day | TRANSDERMAL | Status: DC
  Administered 2020-10-02: 7 mg via TRANSDERMAL
  Filled 2020-10-02: qty 1

## 2020-10-02 MED ORDER — BUPRENORPHINE HCL 8 MG SL SUBL: 8.0000 mg | SUBLINGUAL_TABLET | Freq: Every day | SUBLINGUAL | Status: DC

## 2020-10-02 MED ORDER — KETOROLAC TROMETHAMINE 30 MG/ML IJ SOLN
30.0000 mg | Freq: Once | INTRAMUSCULAR | Status: AC
  Administered 2020-10-02: 30 mg via INTRAVENOUS
  Filled 2020-10-02: qty 1

## 2020-10-02 MED ORDER — SODIUM CHLORIDE 0.9 % IV SOLN
2.0000 g | Freq: Once | INTRAVENOUS | Status: AC
  Administered 2020-10-02: 2 g via INTRAVENOUS
  Filled 2020-10-02: qty 2

## 2020-10-02 MED ORDER — VANCOMYCIN HCL 1000 MG/200ML IV SOLN
1000.0000 mg | Freq: Three times a day (TID) | INTRAVENOUS | Status: DC
  Filled 2020-10-02: qty 200

## 2020-10-02 MED ORDER — THIAMINE HCL 100 MG/ML IJ SOLN: 100.0000 mg | Freq: Every day | INTRAMUSCULAR | Status: DC

## 2020-10-02 MED ORDER — ADULT MULTIVITAMIN W/MINERALS CH: 1.0000 | ORAL_TABLET | Freq: Every day | ORAL | Status: DC

## 2020-10-02 MED ORDER — BUPRENORPHINE HCL-NALOXONE HCL 8-2 MG SL SUBL: 1.0000 | SUBLINGUAL_TABLET | Freq: Every day | SUBLINGUAL | Status: DC

## 2020-10-02 MED ORDER — SODIUM CHLORIDE 0.9 % IV SOLN: 1.0000 g | INTRAVENOUS | Status: DC

## 2020-10-02 MED ORDER — DOXYCYCLINE HYCLATE 100 MG PO CAPS: 100.0000 mg | ORAL_CAPSULE | Freq: Two times a day (BID) | ORAL | 0 refills | Status: DC

## 2020-10-02 MED ORDER — PREDNISONE 20 MG PO TABS: ORAL_TABLET | ORAL | 0 refills | Status: DC

## 2020-10-02 MED ORDER — FUROSEMIDE 10 MG/ML IJ SOLN
40.0000 mg | Freq: Two times a day (BID) | INTRAMUSCULAR | Status: DC
  Administered 2020-10-02: 40 mg via INTRAVENOUS
  Filled 2020-10-02: qty 4

## 2020-10-02 MED ORDER — ONDANSETRON HCL 4 MG PO TABS: 4.0000 mg | ORAL_TABLET | Freq: Four times a day (QID) | ORAL | Status: DC | PRN

## 2020-10-02 MED ORDER — LISINOPRIL 40 MG PO TABS: 40.0000 mg | ORAL_TABLET | Freq: Every day | ORAL | Status: DC

## 2020-10-02 MED ORDER — ENOXAPARIN SODIUM 40 MG/0.4ML IJ SOSY: 40.0000 mg | PREFILLED_SYRINGE | INTRAMUSCULAR | Status: DC

## 2020-10-02 MED ORDER — SODIUM CHLORIDE 0.9 % IV SOLN
2.0000 g | INTRAVENOUS | Status: DC
  Administered 2020-10-02: 2 g via INTRAVENOUS
  Filled 2020-10-02 (×2): qty 20

## 2020-10-02 MED ORDER — ONDANSETRON HCL 4 MG/2ML IJ SOLN
4.0000 mg | Freq: Four times a day (QID) | INTRAMUSCULAR | Status: DC | PRN
Start: 2020-10-02 — End: 2020-10-03

## 2020-10-02 MED ORDER — LORAZEPAM 1 MG PO TABS
1.0000 mg | ORAL_TABLET | ORAL | Status: DC | PRN
Start: 2020-10-02 — End: 2020-10-03

## 2020-10-02 MED ORDER — VANCOMYCIN HCL 2000 MG/400ML IV SOLN
2000.0000 mg | Freq: Once | INTRAVENOUS | Status: DC
  Filled 2020-10-02: qty 400

## 2020-10-02 MED ORDER — AEROCHAMBER PLUS FLO-VU LARGE MISC
Status: AC
  Filled 2020-10-02: qty 1

## 2020-10-02 MED ORDER — TIOTROPIUM BROMIDE MONOHYDRATE 18 MCG IN CAPS: 18.0000 ug | ORAL_CAPSULE | Freq: Every day | RESPIRATORY_TRACT | Status: DC

## 2020-10-02 MED ORDER — AMLODIPINE BESYLATE 10 MG PO TABS
10.0000 mg | ORAL_TABLET | Freq: Every day | ORAL | Status: DC
  Filled 2020-10-02: qty 1

## 2020-10-02 MED ORDER — ACETAMINOPHEN 325 MG PO TABS
650.0000 mg | ORAL_TABLET | Freq: Four times a day (QID) | ORAL | Status: DC | PRN
  Administered 2020-10-02: 650 mg via ORAL
  Filled 2020-10-02: qty 2

## 2020-10-02 MED ORDER — THIAMINE HCL 100 MG PO TABS
100.0000 mg | ORAL_TABLET | Freq: Every day | ORAL | Status: DC
  Administered 2020-10-02: 100 mg via ORAL
  Filled 2020-10-02: qty 1

## 2020-10-02 MED ORDER — ACETAMINOPHEN 500 MG PO TABS
1000.0000 mg | ORAL_TABLET | Freq: Once | ORAL | Status: AC
  Administered 2020-10-02: 1000 mg via ORAL
  Filled 2020-10-02: qty 2

## 2020-10-02 MED ORDER — ALBUTEROL SULFATE (2.5 MG/3ML) 0.083% IN NEBU: 2.5000 mg | INHALATION_SOLUTION | RESPIRATORY_TRACT | Status: DC | PRN

## 2020-10-02 MED ORDER — FOLIC ACID 1 MG PO TABS
1.0000 mg | ORAL_TABLET | Freq: Every day | ORAL | Status: DC
  Administered 2020-10-02: 1 mg via ORAL
  Filled 2020-10-02: qty 1

## 2020-10-02 MED ORDER — ALBUTEROL SULFATE HFA 108 (90 BASE) MCG/ACT IN AERS
6.0000 | INHALATION_SPRAY | RESPIRATORY_TRACT | Status: DC | PRN
  Administered 2020-10-02: 6 via RESPIRATORY_TRACT
  Filled 2020-10-02: qty 6.7

## 2020-10-02 MED ORDER — LORAZEPAM 2 MG/ML IJ SOLN: 1.0000 mg | INTRAMUSCULAR | Status: DC | PRN

## 2020-10-02 MED ORDER — IPRATROPIUM-ALBUTEROL 0.5-2.5 (3) MG/3ML IN SOLN
3.0000 mL | Freq: Four times a day (QID) | RESPIRATORY_TRACT | Status: DC
  Filled 2020-10-02: qty 3

## 2020-10-02 MED ORDER — SODIUM CHLORIDE 0.9 % IV SOLN: 2.0000 g | Freq: Three times a day (TID) | INTRAVENOUS | Status: DC

## 2020-10-02 MED ORDER — ACETAMINOPHEN 650 MG RE SUPP: 650.0000 mg | Freq: Four times a day (QID) | RECTAL | Status: DC | PRN

## 2020-10-02 MED ORDER — METHYLPREDNISOLONE SODIUM SUCC 40 MG IJ SOLR
40.0000 mg | Freq: Four times a day (QID) | INTRAMUSCULAR | Status: DC
  Administered 2020-10-02 – 2020-10-03 (×2): 40 mg via INTRAVENOUS
  Filled 2020-10-02 (×2): qty 1

## 2020-10-02 MED ORDER — PREDNISONE 20 MG PO TABS: 40.0000 mg | ORAL_TABLET | Freq: Every day | ORAL | Status: DC

## 2020-10-02 NOTE — ED Triage Notes (Signed)
Patient coming from home with [facility], was sitting on the toilet and fell asleep, ETOH on board, fell off striking his head, not on blood thinner, denies LOC, also complaining of SOB and R hand pain, hx of CHF and COPD. C collar in place  EMS vitals 130/92 58 HR 91% on RA 98% on 2L [place] CBG 230
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
## 2020-10-02 NOTE — Progress Notes (Signed)
Vitals taken, pt oriented to unit.

## 2020-10-02 NOTE — ED Provider Notes (Signed)
  Physical Exam  BP 139/83   Pulse (!) 49   Temp 99.4 F (37.4 C) (Oral)   Resp (!) 23   Ht 5\' 8"  (1.727 m)   Wt 102.6 kg   SpO2 96%   BMI 34.39 kg/m   Physical Exam  ED Course/Procedures     Procedures  MDM     Received care of patient at 7 AM from Dr. Lynn Ito.  Please see his note for prior history, physical and care.  Briefly this is a 55 year old male with a history of CHF, COPD, hypertension, polysubstance abuse, recent admission May 5 with concern for acute hypoxic hypercapnic respiratory failure multifactorial in the setting of COPD exacerbation questional pneumonia and heart failure who presented to the emergency department last night for a fall, with concern for drug intoxication.  He did admit to nursing staff that he has not slept in 3 days because he was up using cocaine.  At time of my evaluation, I took him off of oxygen, he was found to have a desaturation down to the mid 80s, however is very somnolent and its unclear if sleep apnea could also be contributing to his hypoxia.  His chest x-ray again shows concern for possible pneumonia.  Discussed with patient my recommendation for admission to the hospital given his hypoxia and somnolence, however he refuses.  He reports he would like to go home.  At this time he is too sleepy to go home, however I do feel he has a capacity to refuse admission, and the plan is to continue to monitor in the emergency department for clinical improvement as he metabolizes possible drugs and wakes up more to recheck O2 levels and/or re-discuss admission.  On reevaluation, he is awake, appropriate, reports he would like to make some changes in life. His oxygen saturations are initially ok, he is sitting up and eating and discussed plan for likely discharge as he appeared improved. Reports he is on oxygen just at night 3L but not during the day.    He became hypoxic again to the mid 80s.  Reports on history chronic dyspnea, cough, worsening over  the last few weeks.  Possible pneumonia on top of chronic respiratotry failure. WIll give vanc/cefepime given recent abx/hospitalization and admit for further care.  Does not appear to have COPD exacerbation, low suspicion for PE, do not suspect primary CHF exacerbation at this time.          Gareth Morgan, MD 10/02/20 [id number]

## 2020-10-02 NOTE — Progress Notes (Signed)
Pharmacy Antibiotic Note  [name] is a 55 y.o. male admitted on 10/02/2020 with pneumonia.  Pharmacy has been consulted for cefepime and vancomycin dosing.  WBC wnl, SCr wnl.   Plan: -Cefepime 2 gm IV Q 8 hours -Vancomycin 2 gm IV load followed by Vancomycin 1000 mg IV Q 8 hrs. Goal AUC 400-550. Expected AUC: 507 SCr used: 0.71 -Monitor CBC, renal fx, cultures and clinical progress -Vanc levels as indicated    Height: 5\' 8"  (172.7 cm) Weight: 102.6 kg (226 lb 3.1 oz) IBW/kg (Calculated) : 68.4  Temp (24hrs), Avg:99.4 F (37.4 C), Min:99.4 F (37.4 C), Max:99.4 F (37.4 C)  Recent Labs  Lab 10/02/20 0222  WBC 8.5  CREATININE 0.71    Estimated Creatinine Clearance: 119.7 mL/min (by C-G formula based on SCr of 0.71 mg/dL).    Allergies  Allergen Reactions  . Penicillins     From childhood: Has patient had a PCN reaction causing immediate rash, facial/tongue/throat swelling, SOB or lightheadedness with hypotension: Unknown Has patient had a PCN reaction causing severe rash involving mucus membranes or skin necrosis: Unknown Has patient had a PCN reaction that required hospitalization: Unknown Has patient had a PCN reaction occurring within the last 10 years: No If all of the above answers are "NO", then may proceed with Cephalosporin use.     Antimicrobials this admission: Cefepime 5/15 >>  Vancomycin 5/15 >>   Dose adjustments this admission:  Microbiology results: 5/15 BCx:    Thank you for allowing pharmacy to be a part of this patient's care.  Albertina Parr, PharmD., BCPS, BCCCP Clinical Pharmacist Please refer to [facility] for unit-specific pharmacist

## 2020-10-02 NOTE — Progress Notes (Signed)
Orthopedic Tech Progress Note Patient Details:  [name] 1966-01-22 [id number]  Ortho Devices Type of Ortho Device: Finger splint Ortho Device/Splint Location: rue 4th finger Ortho Device/Splint Interventions: Ordered,Application,Adjustment   Post Interventions Patient Tolerated: Well Instructions Provided: Care of device,Adjustment of device   Karolee Stamps 10/02/2020, 7:09 AM

## 2020-10-02 NOTE — ED Notes (Signed)
Attempted to call report
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
## 2020-10-02 NOTE — ED Notes (Signed)
Pt cannot stay awake long enough to provide a urine sample, somewhat disagreeable to idea of providing one, states "do I have to!". Explained to pt that it is ordered and would be helpful. Pt states "OK! OK! I will when I can. I know what is wrong. I haven't slept in 3 days. I was up partying and using cocaine for 3 days". Pt now back asleep. Urinal at bedside. Pt remains on 3L [place].

## 2020-10-02 NOTE — ED Notes (Signed)
Pt very aggressively demanding food and drink at this time. Pt has taken off finger splint applied by ortho and taken off O2 monitoring device. Pt states that if he does not get something to eat and drink at this time he threatens to walk out. Explained to pt that his needs for food and drink will be taken care of at the earliest convenience. Pt given soda, [place] sandwich bag, and graham crackers with peanut butter.

## 2020-10-02 NOTE — ED Notes (Signed)
Patient transported to X-ray 

## 2020-10-02 NOTE — Progress Notes (Signed)
Pt refusing BIPAP for the night. States "he is breathing fine and does not need anything to help him breathe better. He just needs to sleep".

## 2020-10-02 NOTE — ED Notes (Signed)
Patient was given a [place] Sandwich bag, [name] w/ Peanut Butter and Ginger Ale

## 2020-10-03 DIAGNOSIS — Z03818 Encounter for observation for suspected exposure to other biological agents ruled out: Secondary | ICD-10-CM | POA: Diagnosis not present

## 2020-10-03 DIAGNOSIS — J9601 Acute respiratory failure with hypoxia: Secondary | ICD-10-CM | POA: Diagnosis not present

## 2020-10-03 DIAGNOSIS — Z79899 Other long term (current) drug therapy: Secondary | ICD-10-CM | POA: Diagnosis not present

## 2020-10-03 DIAGNOSIS — J9602 Acute respiratory failure with hypercapnia: Secondary | ICD-10-CM | POA: Diagnosis not present

## 2020-10-03 LAB — CBC
HCT: 43.8 % (ref 39.0–52.0)
Hemoglobin: 14.6 g/dL (ref 13.0–17.0)
MCH: 35.7 pg — ABNORMAL HIGH (ref 26.0–34.0)
MCHC: 33.3 g/dL (ref 30.0–36.0)
MCV: 107.1 fL — ABNORMAL HIGH (ref 80.0–100.0)
Platelets: 184 10*3/uL (ref 150–400)
RBC: 4.09 MIL/uL — ABNORMAL LOW (ref 4.22–5.81)
RDW: 13.9 % (ref 11.5–15.5)
WBC: 13.7 10*3/uL — ABNORMAL HIGH (ref 4.0–10.5)
nRBC: 0 % (ref 0.0–0.2)

## 2020-10-03 LAB — BASIC METABOLIC PANEL
Anion gap: 6 (ref 5–15)
BUN: 14 mg/dL (ref 6–20)
CO2: 31 mmol/L (ref 22–32)
Calcium: 8.6 mg/dL — ABNORMAL LOW (ref 8.9–10.3)
Chloride: 96 mmol/L — ABNORMAL LOW (ref 98–111)
Creatinine, Ser: 0.97 mg/dL (ref 0.61–1.24)
GFR, Estimated: >60 mL/min (ref >60)
Glucose, Bld: 257 mg/dL — ABNORMAL HIGH (ref 70–99)
Potassium: 4.6 mmol/L (ref 3.5–5.1)
Sodium: 133 mmol/L — ABNORMAL LOW (ref 135–145)

## 2020-10-03 LAB — MAGNESIUM: Magnesium: 2 mg/dL (ref 1.7–2.4)

## 2020-10-03 MED ORDER — PREDNISONE 50 MG PO TABS: 50.0000 mg | ORAL_TABLET | Freq: Every day | ORAL | 0 refills | Status: AC

## 2020-10-03 MED ORDER — DOXYCYCLINE HYCLATE 100 MG PO TABS: 100.0000 mg | ORAL_TABLET | Freq: Two times a day (BID) | ORAL | Status: DC

## 2020-10-03 NOTE — Progress Notes (Signed)
Pt wanting AMA papers. Getting for pt to sign.

## 2020-10-03 NOTE — Progress Notes (Signed)
Pt refusing care, wants to leave and demanding Doctor to allow him to leave. Doctor was paged.

## 2020-10-03 NOTE — Discharge Summary (Signed)
Physician Discharge Summary  [name] GYI:[id number] DOB: [birth year] DOA: 10/02/2020  PCP: [name], NP  Admit date: 10/02/2020 Discharge date: 10/03/2020 30 Day Unplanned Readmission Risk Score   Flowsheet Row ED to Hosp-Admission (Current) from 10/02/2020 in [facility]  30 Day Unplanned Readmission Risk Score (%) 35.41 Filed at 10/03/2020 0801     This score is the patient's risk of an unplanned readmission within 30 days of being discharged (0 -100%). The score is based on dignosis, age, lab data, medications, orders, and past utilization.   Low:  0-14.9   Medium: 15-21.9   High: 22-29.9   Extreme: 30 and above         Admitted From: Home Disposition: Left AGAINST MEDICAL ADVICE  Recommendations for Outpatient Follow-up:  1. Follow up with PCP in 1-2 weeks 2. Please obtain BMP/CBC in one week 3. Please follow up with your PCP on the following pending results: Unresulted Labs (From admission, onward)          Start     Ordered   10/09/20 0500  Creatinine, serum  (enoxaparin (LOVENOX)    CrCl >/= 30 ml/min)  Weekly,   R     Comments: while on enoxaparin therapy    10/02/20 1945           Following is the HPI copied from my colleague Dr. Esmeralda Arthur H&P. HPI: [name] is a 55 y.o. M with hx COPD, dCHF, polysubtance abuse, alcohol use, IVDU s/p candida T12 vertebral osteomyelitis in 2018, obesity, and HTN who presented after "falling out."  Patient recently admitted for cough, dyspnea.  Treated with steroids, antibiotics, and diuretics, diuresed 5 pounds, discharged on prednisone, azithromycin, which he completed.  However over the next several days, he was feeling better, so he started using cocaine and drinking alcohol.  Finally last 48 hours, he was smoking, drinking, and "doing dope", stayed up for 48 hours, at the end of which he was at a friend's house, try to go to bed when he "fell asleep standing up", collapsed on his right  hand, and face.  In the ER, CT head and C-spine showed no injury.  Right hand x-ray showed a proximal phalanx fracture.  He was hypoxic, tachypneic, and chest x-ray showed small lung volumes and vascular crowding. ABG showed pH 7.3, pCO2 60s. It was felt that he would be able to sober up, his hypoxia would resolve and he could leave, but instead he remained hypoxic through the day, and so he was started on broad-spectrum antibiotics and the hospitalist service were asked to evaluate for hypoxia. COVID negative.    Subjective: Went to see patient.  Patient was upset and had already signed AMA paper.  Brief/Interim Summary: Patient was with multiple issues including acute on chronic respiratory failure with hypoxia and hypercapnia which was thought to be secondary to combination of OSA, OHS and COPD exacerbation, he was started on bronchodilators, steroids and antibiotics.   When I went to see this patient this morning, the movement I entered his room, he was in the process of signing [place] paperwork.  This was my first encounter with the patient.  He was very angry and upset and complained about ill treatment from the staff since he has been here.  He then kept on saying " they treatment like a dog, they will not give me food for several hours and then once they give you food, its ice cold" and then he kept on  going and going.  I tried to convince him that I will try my best to relay his complaints to the floor director and supervisors and try to convince him to stay however he had already made his mind and had called someone to pick him up.  Initially he was getting louder and louder but then he was calm and talk to me couple of minutes but he did not want to stay longer despite of my counseling.  He did not want me to prescribe any medications either.  Despite of this, I have prescribed him 4 days of oral doxycycline and prednisone and advised him to take them.  Discharge Diagnoses:  Principal  Problem:   Acute respiratory failure with hypoxia and hypercapnia (HCC) Active Problems:   Cocaine abuse ([place])   Homeless single person   Tobacco abuse   Essential hypertension   Chronic diastolic CHF (congestive heart failure) (HCC)   Acute exacerbation of chronic obstructive pulmonary disease (COPD) (HCC)   Obesity (BMI 30.0-34.9)   Obesity hypoventilation syndrome ([place])    Discharge Instructions   Allergies as of 10/03/2020      Reactions   Penicillins    Has tolerated cefepime 5/22 From childhood: Has patient had a PCN reaction causing immediate rash, facial/tongue/throat swelling, SOB or lightheadedness with hypotension: Unknown Has patient had a PCN reaction causing severe rash involving mucus membranes or skin necrosis: Unknown Has patient had a PCN reaction that required hospitalization: Unknown Has patient had a PCN reaction occurring within the last 10 years: No If all of the above answers are "NO", then may proceed with Cephalosporin use.      Medication List    TAKE these medications   albuterol 108 (90 Base) MCG/ACT inhaler Commonly known as: VENTOLIN HFA Inhale 2 puffs into the lungs every 4 (four) hours as needed for wheezing or shortness of breath (cough).   amLODipine 10 MG tablet Commonly known as: NORVASC Take 1 tablet (10 mg total) by mouth daily.   budesonide-formoterol 160-4.5 MCG/ACT inhaler Commonly known as: Symbicort Inhale 2 puffs into the lungs in the morning and at bedtime.   doxycycline 100 MG capsule Commonly known as: VIBRAMYCIN Take 1 capsule (100 mg total) by mouth 2 (two) times daily.   folic acid 1 MG tablet Commonly known as: FOLVITE Take 1 tablet (1 mg total) by mouth daily.   furosemide 40 MG tablet Commonly known as: LASIX Take 1 tablet (40 mg total) by mouth 2 (two) times daily.   guaiFENesin 600 MG 12 hr tablet Commonly known as: MUCINEX Take 2 tablets (1,200 mg total) by mouth 2 (two) times daily.   lisinopril 40 MG  tablet Commonly known as: ZESTRIL Take 1 tablet (40 mg total) by mouth daily.   nicotine 7 mg/24hr patch Commonly known as: NICODERM CQ - dosed in mg/24 hr Place 1 patch (7 mg total) onto the skin daily.   predniSONE 20 MG tablet Commonly known as: DELTASONE 3 tabs po daily x 3 days, then 2 tabs x 3 days, then 1.5 tabs x 3 days, then 1 tab x 3 days, then 0.5 tabs x 3 days What changed:   how much to take  how to take this  when to take this  additional instructions   predniSONE 50 MG tablet Commonly known as: DELTASONE Take 1 tablet (50 mg total) by mouth daily with breakfast for 4 days. What changed: You were already taking a medication with the same name, and this prescription was  added. Make sure you understand how and when to take each.   Spiriva HandiHaler 18 MCG inhalation capsule Generic drug: tiotropium Place 1 capsule (18 mcg total) into inhaler and inhale daily.   Suboxone 8-2 MG Film Generic drug: Buprenorphine HCl-Naloxone HCl Place 1 Film under the tongue in the morning and at bedtime.       Follow-up Information    Dayna Barker, MD.   Specialty: General Surgery Why: treat finger fracture Contact information: [address] [place] [address] [phone]              Allergies  Allergen Reactions  . Penicillins     Has tolerated cefepime 5/22 From childhood: Has patient had a PCN reaction causing immediate rash, facial/tongue/throat swelling, SOB or lightheadedness with hypotension: Unknown Has patient had a PCN reaction causing severe rash involving mucus membranes or skin necrosis: Unknown Has patient had a PCN reaction that required hospitalization: Unknown Has patient had a PCN reaction occurring within the last 10 years: No If all of the above answers are "NO", then may proceed with Cephalosporin use.     Consultations: None   Procedures/Studies: DG Chest 1 View  Result Date: 10/02/2020 CLINICAL DATA:  Pain.  Fall  off toilet. EXAM: CHEST  1 VIEW COMPARISON:  09/22/2020 FINDINGS: Lower lung volumes from prior exam. Patient's chin obscures the apices. Similar cardiomegaly. Bronchial thickening appears similar. Patchy bibasilar opacities are not significantly changed. No pneumothorax or pleural effusion. No acute osseous abnormalities are seen. IMPRESSION: Lower lung volumes from prior exam. Patchy bibasilar opacities are unchanged, atelectasis versus pneumonia. Electronically Signed   By: Keith Rake M.D.   On: 10/02/2020 03:02   CT HEAD WO CONTRAST  Result Date: 10/02/2020 CLINICAL DATA:  Head trauma, abnormal mental status (Age 62-64y) Fall off toilet after falling asleep. EXAM: CT HEAD WITHOUT CONTRAST TECHNIQUE: Contiguous axial images were obtained from the base of the skull through the vertex without intravenous contrast. COMPARISON:  Head CT 03/18/2018 FINDINGS: Brain: No intracranial hemorrhage, mass effect, or midline shift. No hydrocephalus. The basilar cisterns are patent. No evidence of territorial infarct or acute ischemia. No extra-axial or intracranial fluid collection. Vascular: Atherosclerosis of skullbase vasculature without hyperdense vessel or abnormal calcification. Skull: No skull fracture.  Left parietal bone island is unchanged. Sinuses/Orbits: Lobulated mucosal thickening of left maxillary sinus. Occasional passive occasion of ethmoid air cells. No acute findings. Mastoid air cells are clear. Other: None. IMPRESSION: No acute intracranial abnormality. No skull fracture. Electronically Signed   By: Keith Rake M.D.   On: 10/02/2020 02:55   CT CERVICAL SPINE WO CONTRAST  Result Date: 10/02/2020 CLINICAL DATA:  Neck trauma, intoxicated or obtunded (Age >= 16y) Fall off toilet after falling asleep. EXAM: CT CERVICAL SPINE WITHOUT CONTRAST TECHNIQUE: Multidetector CT imaging of the cervical spine was performed without intravenous contrast. Multiplanar CT image reconstructions were also  generated. COMPARISON:  None. FINDINGS: Mild motion artifact limitations. Alignment: Reversal of normal lordosis. Trace anterolisthesis of C7 on T1. Skull base and vertebrae: No acute fracture. No primary bone lesion or focal pathologic process. Soft tissues and spinal canal: No prevertebral fluid or swelling. No visible canal hematoma. Disc levels: Degenerative disc disease C4-C5 through C6-C7. Central disc protrusion at C4-C5 causes narrowing of the spinal canal. There is multilevel facet hypertrophy. Upper chest: No acute findings. Other: Carotid calcifications. IMPRESSION: 1. No acute fracture or subluxation of the cervical spine. 2. Multilevel degenerative disc disease and facet hypertrophy. Central  disc protrusion at C4-C5 causes narrowing of the spinal canal. Electronically Signed   By: Keith Rake M.D.   On: 10/02/2020 02:59   DG Chest Port 1 View  Result Date: 09/22/2020 CLINICAL DATA:  Shortness of breath EXAM: PORTABLE CHEST 1 VIEW COMPARISON:  06/25/2020 FINDINGS: Cardiomegaly and vascular pedicle widening. Generalized airway thickening with patchy pulmonary density that is mildly improved. Aortic tortuosity. No visible effusion or pneumothorax IMPRESSION: 1. Indistinct pulmonary infiltrates bilaterally, possible recurrent pneumonia. 2. Background of chronic bronchitic markings. Electronically Signed   By: Monte Fantasia M.D.   On: 09/22/2020 04:09   DG Hand Complete Right  Result Date: 10/02/2020 CLINICAL DATA:  Fall, injury. EXAM: RIGHT HAND - COMPLETE 3+ VIEW COMPARISON:  None. FINDINGS: Acute fracture of the base of the fourth proximal phalanx, minimally displaced. Distal fifth metacarpal fracture appears remote. A dressing overlies the third digit, no obvious underlying fracture. The index finger and fifth digit are held in flexion on all views. Carpal bones are partially excluded on some views. Distal radial hardware partially visualized. IMPRESSION: 1. Acute minimally displaced  fracture of the base of the fourth proximal phalanx. 2. Remote appearing distal fifth metacarpal fracture. 3. A dressing overlies the third digit, no evidence of underlying fracture. Electronically Signed   By: Keith Rake M.D.   On: 10/02/2020 03:06   ECHOCARDIOGRAM LIMITED  Result Date: 09/23/2020    ECHOCARDIOGRAM LIMITED REPORT   Patient Name:   [name] Date of Exam: 09/23/2020 Medical Rec #:  [id number]     Height:       68.0 in Accession #:    [id number]    Weight:       225.7 lb Date of Birth:  07/27/1965     BSA:          2.152 m Patient Age:    55 years      BP:           146/87 mmHg Patient Gender: M             HR:           60 bpm. Exam Location:  Inpatient Procedure: Limited Echo, Cardiac Doppler and Color Doppler Indications:    CHF-Acute Diastolic  History:        Patient has prior history of Echocardiogram examinations, most                 recent 10/20/2019. CHF, COPD; Risk Factors:Current Smoker and                 Hypertension. Polysubstance abuse. Hx COVID-19.  Sonographer:    Clayton Lefort RDCS (AE) Referring Phys: [id number] RONDELL A SMITH IMPRESSIONS  1. Left ventricular ejection fraction, by estimation, is 65 to 70%. The left ventricle has normal function. There is mild concentric left ventricular hypertrophy. Left ventricular diastolic parameters are consistent with Grade I diastolic dysfunction (impaired relaxation).  2. Right ventricular systolic function is normal. The right ventricular size is normal. Tricuspid regurgitation signal is inadequate for assessing PA pressure.  3. The mitral valve is grossly normal. No evidence of mitral valve regurgitation. No evidence of mitral stenosis.  4. The aortic valve is tricuspid. There is mild calcification of the aortic valve. There is mild thickening of the aortic valve. Aortic valve regurgitation is not visualized. Mild aortic valve sclerosis is present, with no evidence of aortic valve stenosis.  5. There is mild dilatation of the ascending  aorta, measuring 42 mm.  6.  The inferior vena cava is normal in size with greater than 50% respiratory variability, suggesting right atrial pressure of 3 mmHg. Comparison(s): Changes from prior study are noted. EF is now 65-70%. FINDINGS  Left Ventricle: Left ventricular ejection fraction, by estimation, is 65 to 70%. The left ventricle has normal function. The left ventricular internal cavity size was normal in size. There is mild concentric left ventricular hypertrophy. Left ventricular diastolic parameters are consistent with Grade I diastolic dysfunction (impaired relaxation). Right Ventricle: The right ventricular size is normal. No increase in right ventricular wall thickness. Right ventricular systolic function is normal. Tricuspid regurgitation signal is inadequate for assessing PA pressure. Pericardium: Trivial pericardial effusion is present. Presence of pericardial fat pad. Mitral Valve: The mitral valve is grossly normal. No evidence of mitral valve stenosis. Tricuspid Valve: The tricuspid valve is grossly normal. Tricuspid valve regurgitation is trivial. No evidence of tricuspid stenosis. Aortic Valve: The aortic valve is tricuspid. There is mild calcification of the aortic valve. There is mild thickening of the aortic valve. Aortic valve regurgitation is not visualized. Mild aortic valve sclerosis is present, with no evidence of aortic valve stenosis. Aortic valve mean gradient measures 9.0 mmHg. Aortic valve peak gradient measures 14.3 mmHg. Aortic valve area, by VTI measures 2.62 cm. Pulmonic Valve: The pulmonic valve was grossly normal. Pulmonic valve regurgitation is not visualized. No evidence of pulmonic stenosis. Aorta: The aortic root is normal in size and structure. There is mild dilatation of the ascending aorta, measuring 42 mm. Venous: The inferior vena cava is normal in size with greater than 50% respiratory variability, suggesting right atrial pressure of 3 mmHg. LEFT VENTRICLE PLAX 2D  LVIDd:         4.80 cm  Diastology LVIDs:         3.10 cm  LV e' medial:    4.46 cm/s LV PW:         1.29 cm  LV E/e' medial:  16.2 LV IVS:        1.20 cm  LV e' lateral:   7.29 cm/s LVOT diam:     2.10 cm  LV E/e' lateral: 9.9 LV SV:         91 LV SV Index:   42 LVOT Area:     3.46 cm  IVC IVC diam: 2.10 cm LEFT ATRIUM         Index LA diam:    4.20 cm 1.95 cm/m  AORTIC VALVE AV Area (Vmax):    2.75 cm AV Area (Vmean):   2.60 cm AV Area (VTI):     2.62 cm AV Vmax:           189.00 cm/s AV Vmean:          140.000 cm/s AV VTI:            0.349 m AV Peak Grad:      14.3 mmHg AV Mean Grad:      9.0 mmHg LVOT Vmax:         150.00 cm/s LVOT Vmean:        105.000 cm/s LVOT VTI:          0.264 m LVOT/AV VTI ratio: 0.76  AORTA Ao Root diam: 3.90 cm Ao Asc diam:  4.20 cm MITRAL VALVE MV Area (PHT): 2.60 cm    SHUNTS MV Decel Time: 292 msec    Systemic VTI:  0.26 m MV E velocity: 72.40 cm/s  Systemic Diam: 2.10 cm MV A velocity: 70.70  cm/s MV E/A ratio:  1.02 Eleonore Chiquito MD Electronically signed by Eleonore Chiquito MD Signature Date/Time: 09/23/2020/3:08:08 PM    Final      Discharge Exam: Vitals:   10/03/20 0000 10/03/20 0746  BP: 113/63 138/78  Pulse: (!) 51 (!) 56  Resp: 18 16  Temp: 98.8 F (37.1 C) 97.8 F (36.6 C)  SpO2: 95% 100%   Vitals:   10/02/20 2300 10/03/20 0000 10/03/20 0500 10/03/20 0746  BP: 99/62 113/63  138/78  Pulse: (!) 52 (!) 51  (!) 56  Resp: 16 18  16   Temp: 98.9 F (37.2 C) 98.8 F (37.1 C)  97.8 F (36.6 C)  TempSrc: Oral Oral  Oral  SpO2: 95% 95%  100%  Weight:   103.3 kg   Height:        Patient did not allow me to examine him.  The results of significant diagnostics from this hospitalization (including imaging, microbiology, ancillary and laboratory) are listed below for reference.     Microbiology: Recent Results (from the past 240 hour(s))  Resp Panel by RT-PCR (Flu A&B, Covid) Nasopharyngeal Swab     Status: None   Collection Time: 10/02/20  4:10 AM    Specimen: Nasopharyngeal Swab; Nasopharyngeal(NP) swabs in vial transport medium  Result Value Ref Range Status   SARS Coronavirus 2 by RT PCR NEGATIVE NEGATIVE Final    Comment: (NOTE) SARS-CoV-2 target nucleic acids are NOT DETECTED.  The SARS-CoV-2 RNA is generally detectable in upper respiratory specimens during the acute phase of infection. The lowest concentration of SARS-CoV-2 viral copies this assay can detect is 138 copies/mL. A negative result does not preclude SARS-Cov-2 infection and should not be used as the sole basis for treatment or other patient management decisions. A negative result may occur with  improper specimen collection/handling, submission of specimen other than nasopharyngeal swab, presence of viral mutation(s) within the areas targeted by this assay, and inadequate number of viral copies(<138 copies/mL). A negative result must be combined with clinical observations, patient history, and epidemiological information. The expected result is Negative.  Fact Sheet for Patients:  EntrepreneurPulse.com.au  Fact Sheet for Healthcare Providers:  IncredibleEmployment.be  This test is no t yet approved or cleared by the [place] FDA and  has been authorized for detection and/or diagnosis of SARS-CoV-2 by FDA under an Emergency Use Authorization (EUA). This EUA will remain  in effect (meaning this test can be used) for the duration of the COVID-19 declaration under Section 564(b)(1) of the Act, 21 U.S.C.section 360bbb-3(b)(1), unless the authorization is terminated  or revoked sooner.       Influenza A by PCR NEGATIVE NEGATIVE Final   Influenza B by PCR NEGATIVE NEGATIVE Final    Comment: (NOTE) The Xpert Xpress SARS-CoV-2/FLU/RSV plus assay is intended as an aid in the diagnosis of influenza from Nasopharyngeal swab specimens and should not be used as a sole basis for treatment. Nasal washings and aspirates are  unacceptable for Xpert Xpress SARS-CoV-2/FLU/RSV testing.  Fact Sheet for Patients: EntrepreneurPulse.com.au  Fact Sheet for Healthcare Providers: IncredibleEmployment.be  This test is not yet approved or cleared by the [place] FDA and has been authorized for detection and/or diagnosis of SARS-CoV-2 by FDA under an Emergency Use Authorization (EUA). This EUA will remain in effect (meaning this test can be used) for the duration of the COVID-19 declaration under Section 564(b)(1) of the Act, 21 U.S.C. section 360bbb-3(b)(1), unless the authorization is terminated or revoked.  Performed at [facility]  Lab, [address], [place], [place] [address]      Labs: BNP (last 3 results) Recent Labs    03/15/20 0922 09/22/20 0252 10/02/20 1607  BNP 189.0* 103.5* [id number]*   Basic Metabolic Panel: Recent Labs  Lab 10/02/20 0222 10/02/20 0649 10/03/20 0210  NA 134* 136 133*  K 4.4 4.3 4.6  CL 98  --  96*  CO2 29  --  31  GLUCOSE 157*  --  257*  BUN 13  --  14  CREATININE 0.71  --  0.97  CALCIUM 8.9  --  8.6*  MG  --   --  2.0   Liver Function Tests: Recent Labs  Lab 10/02/20 0222  AST 38  ALT 41  ALKPHOS 51  BILITOT 1.3*  PROT 6.9  ALBUMIN 3.5   No results for input(s): LIPASE, AMYLASE in the last 168 hours. No results for input(s): AMMONIA in the last 168 hours. CBC: Recent Labs  Lab 10/02/20 0222 10/02/20 0649 10/03/20 0210  WBC 8.5  --  13.7*  HGB 14.0 13.9 14.6  HCT 41.6 41.0 43.8  MCV 105.9*  --  107.1*  PLT 194  --  184   Cardiac Enzymes: No results for input(s): CKTOTAL, CKMB, CKMBINDEX, TROPONINI in the last 168 hours. BNP: Invalid input(s): POCBNP CBG: No results for input(s): GLUCAP in the last 168 hours. D-Dimer No results for input(s): DDIMER in the last 72 hours. Hgb A1c No results for input(s): HGBA1C in the last 72 hours. Lipid Profile No results for input(s): CHOL, HDL, LDLCALC, TRIG,  CHOLHDL, LDLDIRECT in the last 72 hours. Thyroid function studies No results for input(s): TSH, T4TOTAL, T3FREE, THYROIDAB in the last 72 hours.  Invalid input(s): FREET3 Anemia work up No results for input(s): VITAMINB12, FOLATE, FERRITIN, TIBC, IRON, RETICCTPCT in the last 72 hours. Urinalysis    Component Value Date/Time   COLORURINE YELLOW 06/22/2020 [place] 06/22/2020 1123   LABSPEC 1.009 06/22/2020 1123   PHURINE 6.0 06/22/2020 1123   GLUCOSEU NEGATIVE 06/22/2020 1123   HGBUR SMALL (A) 06/22/2020 1123   BILIRUBINUR NEGATIVE 06/22/2020 1123   KETONESUR NEGATIVE 06/22/2020 1123   PROTEINUR NEGATIVE 06/22/2020 1123   UROBILINOGEN 1.0 10/16/2010 0217   NITRITE NEGATIVE 06/22/2020 1123   LEUKOCYTESUR NEGATIVE 06/22/2020 1123   Sepsis Labs Invalid input(s): PROCALCITONIN,  WBC,  LACTICIDVEN Microbiology Recent Results (from the past 240 hour(s))  Resp Panel by RT-PCR (Flu A&B, Covid) Nasopharyngeal Swab     Status: None   Collection Time: 10/02/20  4:10 AM   Specimen: Nasopharyngeal Swab; Nasopharyngeal(NP) swabs in vial transport medium  Result Value Ref Range Status   SARS Coronavirus 2 by RT PCR NEGATIVE NEGATIVE Final    Comment: (NOTE) SARS-CoV-2 target nucleic acids are NOT DETECTED.  The SARS-CoV-2 RNA is generally detectable in upper respiratory specimens during the acute phase of infection. The lowest concentration of SARS-CoV-2 viral copies this assay can detect is 138 copies/mL. A negative result does not preclude SARS-Cov-2 infection and should not be used as the sole basis for treatment or other patient management decisions. A negative result may occur with  improper specimen collection/handling, submission of specimen other than nasopharyngeal swab, presence of viral mutation(s) within the areas targeted by this assay, and inadequate number of viral copies(<138 copies/mL). A negative result must be combined with clinical observations,  patient history, and epidemiological information. The expected result is Negative.  Fact Sheet for Patients:  EntrepreneurPulse.com.au  Fact Sheet for Healthcare  Providers:  IncredibleEmployment.be  This test is no t yet approved or cleared by the [place] and  has been authorized for detection and/or diagnosis of SARS-CoV-2 by FDA under an Emergency Use Authorization (EUA). This EUA will remain  in effect (meaning this test can be used) for the duration of the COVID-19 declaration under Section 564(b)(1) of the Act, 21 U.S.C.section 360bbb-3(b)(1), unless the authorization is terminated  or revoked sooner.       Influenza A by PCR NEGATIVE NEGATIVE Final   Influenza B by PCR NEGATIVE NEGATIVE Final    Comment: (NOTE) The Xpert Xpress SARS-CoV-2/FLU/RSV plus assay is intended as an aid in the diagnosis of influenza from Nasopharyngeal swab specimens and should not be used as a sole basis for treatment. Nasal washings and aspirates are unacceptable for Xpert Xpress SARS-CoV-2/FLU/RSV testing.  Fact Sheet for Patients: [employer]  Fact Sheet for Healthcare Providers: IncredibleEmployment.be  This test is not yet approved or cleared by the [place] FDA and has been authorized for detection and/or diagnosis of SARS-CoV-2 by FDA under an Emergency Use Authorization (EUA). This EUA will remain in effect (meaning this test can be used) for the duration of the COVID-19 declaration under Section 564(b)(1) of the Act, 21 U.S.C. section 360bbb-3(b)(1), unless the authorization is terminated or revoked.  Performed at [facility], [place] [address] [place], [place] [address]      Time coordinating discharge: Over 30 minutes  SIGNED:   Darliss Cheney, MD  [facility] 10/03/2020, 11:45 AM  If 7PM-7AM, please contact night-coverage [email or web address]

## 2020-10-04 ENCOUNTER — Telehealth: Payer: Self-pay

## 2020-10-04 NOTE — Telephone Encounter (Signed)
Transition Care Management Unsuccessful Follow-up Telephone Call  Date of discharge and from where:  10/03/2020, [facility]  - left AMA  Attempts:  1st Attempt  Reason for unsuccessful TCM follow-up call:  Left voice message on # [phone] and left a message with his mother at # [phone]. She said he was not at home and she would have him return the call   He has an appointment with Juluis Mire, NP -10/18/2020

## 2020-10-05 ENCOUNTER — Telehealth: Payer: Self-pay

## 2020-10-05 NOTE — Telephone Encounter (Signed)
Transition Care Management Follow-up Telephone Call  Date of discharge and from where: 10/03/2020, [facility] - left AMA  How have you been since you were released from the hospital? He said he is feeling all right today, taking it day by day.   Any questions or concerns? Yes - he said he has no money and he is not able to afford his medications even though they are only $3 each with medicaid.    Items Reviewed:  Did the pt receive and understand the discharge instructions provided? no instructions, left AMA  Medications obtained and verified? No  - he said that he does not have any of his meds and he needs to pick them up.  He explained that his [facility] pharmacy has 10 medications for him and the cost is $30 but he does not have the money.  He stated that he has used [facility] in the past but they asked him to pay some money and he doesn't have it.   Other? No   Any new allergies since your discharge? No   Dietary orders reviewed? No  Do you have support at home? He is currently staying with a friend. He said he stays here and there. No permanent address   He said that he has the splint for his right hand.  Has home O2 that is at his mother's house but he is not staying there. He explained that he uses it when he has it and he needs to get it from his mother.   Home Care and Equipment/Supplies: Were home health services ordered? no If so, what is the name of the agency? n/a  Has the agency set up a time to come to the patient's home? not applicable Were any new equipment or medical supplies ordered?  No What is the name of the medical supply agency? n/a Were you able to get the supplies/equipment? not applicable Do you have any questions related to the use of the equipment or supplies? No  Functional Questionnaire: (I = Independent and D = Dependent) ADLs: independent   Follow up appointments reviewed:   PCP Hospital f/u appt confirmed? Yes  - Juluis Mire, NP at [facility] 10/18/2020.  He was offered an  appointment to be seen sooner, but he refused.   [facility] f/u appt confirmed? No , not that he is aware of  Are transportation arrangements needed? No   If their condition worsens, is the pt aware to call PCP or go to the Emergency Dept.? Yes  Was the patient provided with contact information for the PCP's office or ED? Yes  Was to pt encouraged to call back with questions or concerns? Yes

## 2020-10-07 NOTE — Telephone Encounter (Signed)
Pt has medicaid ($3 a copay with 10 rxs for a total of $30). He can fill with [place] and utilize a charge account, however, he must pay something even if it's not the total amount. If patient is agreeable, we can transfer his rxs to our pharmacy.

## 2020-10-07 NOTE — Telephone Encounter (Signed)
Tried to call patient regarding options for medication no answer no voicemail

## 2020-10-09 ENCOUNTER — Emergency Department (HOSPITAL_COMMUNITY): Payer: Medicaid Other

## 2020-10-09 ENCOUNTER — Encounter (HOSPITAL_COMMUNITY): Payer: Self-pay | Admitting: Emergency Medicine

## 2020-10-09 ENCOUNTER — Other Ambulatory Visit: Payer: Self-pay

## 2020-10-09 ENCOUNTER — Inpatient Hospital Stay (HOSPITAL_COMMUNITY)
Admission: EM | Admit: 2020-10-09 | Discharge: 2020-10-10 | DRG: 189 | Disposition: A | Discharge status: Home / Self Care | Payer: Medicaid Other | Attending: Internal Medicine | Admitting: Internal Medicine

## 2020-10-09 DIAGNOSIS — Z20822 Contact with and (suspected) exposure to covid-19: Secondary | ICD-10-CM | POA: Diagnosis present

## 2020-10-09 DIAGNOSIS — Z5900 Homelessness unspecified: Secondary | ICD-10-CM

## 2020-10-09 DIAGNOSIS — F1990 Other psychoactive substance use, unspecified, uncomplicated: Secondary | ICD-10-CM | POA: Diagnosis present

## 2020-10-09 DIAGNOSIS — R0602 Shortness of breath: Secondary | ICD-10-CM | POA: Diagnosis not present

## 2020-10-09 DIAGNOSIS — F17210 Nicotine dependence, cigarettes, uncomplicated: Secondary | ICD-10-CM | POA: Diagnosis present

## 2020-10-09 DIAGNOSIS — I5032 Chronic diastolic (congestive) heart failure: Secondary | ICD-10-CM | POA: Diagnosis present

## 2020-10-09 DIAGNOSIS — J9601 Acute respiratory failure with hypoxia: Secondary | ICD-10-CM | POA: Diagnosis not present

## 2020-10-09 DIAGNOSIS — Z6834 Body mass index (BMI) 34.0-34.9, adult: Secondary | ICD-10-CM

## 2020-10-09 DIAGNOSIS — E662 Morbid (severe) obesity with alveolar hypoventilation: Secondary | ICD-10-CM | POA: Diagnosis present

## 2020-10-09 DIAGNOSIS — Z8701 Personal history of pneumonia (recurrent): Secondary | ICD-10-CM

## 2020-10-09 DIAGNOSIS — G8929 Other chronic pain: Secondary | ICD-10-CM | POA: Diagnosis present

## 2020-10-09 DIAGNOSIS — J9622 Acute and chronic respiratory failure with hypercapnia: Secondary | ICD-10-CM | POA: Diagnosis present

## 2020-10-09 DIAGNOSIS — J9621 Acute and chronic respiratory failure with hypoxia: Secondary | ICD-10-CM | POA: Diagnosis not present

## 2020-10-09 DIAGNOSIS — Z720 Tobacco use: Secondary | ICD-10-CM | POA: Diagnosis present

## 2020-10-09 DIAGNOSIS — Z8249 Family history of ischemic heart disease and other diseases of the circulatory system: Secondary | ICD-10-CM | POA: Diagnosis not present

## 2020-10-09 DIAGNOSIS — M545 Low back pain, unspecified: Secondary | ICD-10-CM | POA: Diagnosis present

## 2020-10-09 DIAGNOSIS — R0902 Hypoxemia: Secondary | ICD-10-CM | POA: Diagnosis not present

## 2020-10-09 DIAGNOSIS — F1410 Cocaine abuse, uncomplicated: Secondary | ICD-10-CM | POA: Diagnosis present

## 2020-10-09 DIAGNOSIS — J9602 Acute respiratory failure with hypercapnia: Secondary | ICD-10-CM

## 2020-10-09 DIAGNOSIS — F1020 Alcohol dependence, uncomplicated: Secondary | ICD-10-CM | POA: Diagnosis present

## 2020-10-09 DIAGNOSIS — I110 Hypertensive heart disease with heart failure: Secondary | ICD-10-CM | POA: Diagnosis present

## 2020-10-09 DIAGNOSIS — Z823 Family history of stroke: Secondary | ICD-10-CM

## 2020-10-09 DIAGNOSIS — I10 Essential (primary) hypertension: Secondary | ICD-10-CM | POA: Diagnosis present

## 2020-10-09 DIAGNOSIS — R000 Tachycardia, unspecified: Secondary | ICD-10-CM | POA: Diagnosis not present

## 2020-10-09 DIAGNOSIS — R0689 Other abnormalities of breathing: Secondary | ICD-10-CM | POA: Diagnosis not present

## 2020-10-09 DIAGNOSIS — Z03818 Encounter for observation for suspected exposure to other biological agents ruled out: Secondary | ICD-10-CM | POA: Diagnosis not present

## 2020-10-09 DIAGNOSIS — F1910 Other psychoactive substance abuse, uncomplicated: Secondary | ICD-10-CM | POA: Diagnosis present

## 2020-10-09 DIAGNOSIS — R739 Hyperglycemia, unspecified: Secondary | ICD-10-CM

## 2020-10-09 DIAGNOSIS — E669 Obesity, unspecified: Secondary | ICD-10-CM | POA: Diagnosis present

## 2020-10-09 DIAGNOSIS — J449 Chronic obstructive pulmonary disease, unspecified: Secondary | ICD-10-CM | POA: Diagnosis present

## 2020-10-09 DIAGNOSIS — E66811 Obesity, class 1: Secondary | ICD-10-CM | POA: Diagnosis present

## 2020-10-09 LAB — RAPID URINE DRUG SCREEN, HOSP PERFORMED
Amphetamines: NOT DETECTED
Barbiturates: NOT DETECTED
Benzodiazepines: NOT DETECTED
Cocaine: POSITIVE — AB
Opiates: NOT DETECTED
Tetrahydrocannabinol: NOT DETECTED

## 2020-10-09 LAB — BASIC METABOLIC PANEL
Anion gap: 5 (ref 5–15)
BUN: 15 mg/dL (ref 6–20)
CO2: 32 mmol/L (ref 22–32)
Calcium: 8.7 mg/dL — ABNORMAL LOW (ref 8.9–10.3)
Chloride: 100 mmol/L (ref 98–111)
Creatinine, Ser: 0.95 mg/dL (ref 0.61–1.24)
GFR, Estimated: >60 mL/min (ref >60)
Glucose, Bld: 229 mg/dL — ABNORMAL HIGH (ref 70–99)
Potassium: 4.1 mmol/L (ref 3.5–5.1)
Sodium: 137 mmol/L (ref 135–145)

## 2020-10-09 LAB — I-STAT VENOUS BLOOD GAS, ED
Acid-Base Excess: 4 mmol/L — ABNORMAL HIGH (ref 0.0–2.0)
Acid-Base Excess: 4 mmol/L — ABNORMAL HIGH (ref 0.0–2.0)
Bicarbonate: 33.6 mmol/L — ABNORMAL HIGH (ref 20.0–28.0)
Bicarbonate: 30.1 mmol/L — ABNORMAL HIGH (ref 20.0–28.0)
Calcium, Ion: 1.15 mmol/L (ref 1.15–1.40)
Calcium, Ion: 1.04 mmol/L — ABNORMAL LOW (ref 1.15–1.40)
HCT: 47 % (ref 39.0–52.0)
HCT: 42 % (ref 39.0–52.0)
Hemoglobin: 16 g/dL (ref 13.0–17.0)
Hemoglobin: 14.3 g/dL (ref 13.0–17.0)
O2 Saturation: 60 %
O2 Saturation: 89 %
Potassium: 4.1 mmol/L (ref 3.5–5.1)
Potassium: 4.1 mmol/L (ref 3.5–5.1)
Sodium: 140 mmol/L (ref 135–145)
Sodium: 140 mmol/L (ref 135–145)
TCO2: 36 mmol/L — ABNORMAL HIGH (ref 22–32)
TCO2: 32 mmol/L (ref 22–32)
pCO2, Ven: 72.3 mmHg (ref 44.0–60.0)
pCO2, Ven: 51.6 mmHg (ref 44.0–60.0)
pH, Ven: 7.275 (ref 7.250–7.430)
pH, Ven: 7.374 (ref 7.250–7.430)
pO2, Ven: 37 mmHg (ref 32.0–45.0)
pO2, Ven: 58 mmHg — ABNORMAL HIGH (ref 32.0–45.0)

## 2020-10-09 LAB — CBC
HCT: 43.8 % (ref 39.0–52.0)
Hemoglobin: 14.8 g/dL (ref 13.0–17.0)
MCH: 35.9 pg — ABNORMAL HIGH (ref 26.0–34.0)
MCHC: 33.8 g/dL (ref 30.0–36.0)
MCV: 106.3 fL — ABNORMAL HIGH (ref 80.0–100.0)
Platelets: 242 10*3/uL (ref 150–400)
RBC: 4.12 MIL/uL — ABNORMAL LOW (ref 4.22–5.81)
RDW: 14.8 % (ref 11.5–15.5)
WBC: 7.6 10*3/uL (ref 4.0–10.5)
nRBC: 0 % (ref 0.0–0.2)

## 2020-10-09 LAB — TROPONIN I (HIGH SENSITIVITY)
Troponin I (High Sensitivity): 23 ng/L — ABNORMAL HIGH (ref <18)
Troponin I (High Sensitivity): 20 ng/L — ABNORMAL HIGH (ref <18)

## 2020-10-09 LAB — CBG MONITORING, ED
Glucose-Capillary: 338 mg/dL — ABNORMAL HIGH (ref 70–99)
Glucose-Capillary: 331 mg/dL — ABNORMAL HIGH (ref 70–99)

## 2020-10-09 LAB — ETHANOL: Alcohol, Ethyl (B): <10 mg/dL (ref <10)

## 2020-10-09 LAB — RESP PANEL BY RT-PCR (FLU A&B, COVID) ARPGX2
Influenza A by PCR: NEGATIVE
Influenza B by PCR: NEGATIVE
SARS Coronavirus 2 by RT PCR: NEGATIVE

## 2020-10-09 LAB — BRAIN NATRIURETIC PEPTIDE: B Natriuretic Peptide: 121.8 pg/mL — ABNORMAL HIGH (ref 0.0–100.0)

## 2020-10-09 LAB — GLUCOSE, CAPILLARY: Glucose-Capillary: 416 mg/dL — ABNORMAL HIGH (ref 70–99)

## 2020-10-09 MED ORDER — LORAZEPAM 1 MG PO TABS: 1.0000 mg | ORAL_TABLET | ORAL | Status: DC | PRN

## 2020-10-09 MED ORDER — AMLODIPINE BESYLATE 10 MG PO TABS
10.0000 mg | ORAL_TABLET | Freq: Every day | ORAL | Status: DC
  Administered 2020-10-09 – 2020-10-10 (×2): 10 mg via ORAL
  Filled 2020-10-09 (×2): qty 1

## 2020-10-09 MED ORDER — BISACODYL 5 MG PO TBEC: 5.0000 mg | DELAYED_RELEASE_TABLET | Freq: Every day | ORAL | Status: DC | PRN

## 2020-10-09 MED ORDER — HYDROXYZINE HCL 25 MG PO TABS: 25.0000 mg | ORAL_TABLET | Freq: Four times a day (QID) | ORAL | Status: DC | PRN

## 2020-10-09 MED ORDER — IPRATROPIUM-ALBUTEROL 0.5-2.5 (3) MG/3ML IN SOLN
3.0000 mL | Freq: Once | RESPIRATORY_TRACT | Status: AC
  Administered 2020-10-09: 6 mL via RESPIRATORY_TRACT
  Filled 2020-10-09: qty 3

## 2020-10-09 MED ORDER — IPRATROPIUM-ALBUTEROL 0.5-2.5 (3) MG/3ML IN SOLN
3.0000 mL | Freq: Once | RESPIRATORY_TRACT | Status: AC
  Administered 2020-10-09: 3 mL via RESPIRATORY_TRACT
  Filled 2020-10-09: qty 3

## 2020-10-09 MED ORDER — INSULIN ASPART 100 UNIT/ML IJ SOLN
0.0000 [IU] | Freq: Three times a day (TID) | INTRAMUSCULAR | Status: DC
  Administered 2020-10-09 (×2): 11 [IU] via SUBCUTANEOUS

## 2020-10-09 MED ORDER — METHYLPREDNISOLONE SODIUM SUCC 125 MG IJ SOLR
125.0000 mg | Freq: Once | INTRAMUSCULAR | Status: AC
  Administered 2020-10-09: 125 mg via INTRAVENOUS
  Filled 2020-10-09: qty 2

## 2020-10-09 MED ORDER — DICYCLOMINE HCL 20 MG PO TABS
20.0000 mg | ORAL_TABLET | Freq: Four times a day (QID) | ORAL | Status: DC | PRN
  Filled 2020-10-09: qty 1

## 2020-10-09 MED ORDER — METHOCARBAMOL 500 MG PO TABS: 500.0000 mg | ORAL_TABLET | Freq: Three times a day (TID) | ORAL | Status: DC | PRN

## 2020-10-09 MED ORDER — FUROSEMIDE 10 MG/ML IJ SOLN
40.0000 mg | Freq: Once | INTRAMUSCULAR | Status: AC
  Administered 2020-10-09: 40 mg via INTRAVENOUS
  Filled 2020-10-09: qty 4

## 2020-10-09 MED ORDER — ONDANSETRON HCL 4 MG PO TABS: 4.0000 mg | ORAL_TABLET | Freq: Four times a day (QID) | ORAL | Status: DC | PRN

## 2020-10-09 MED ORDER — SODIUM CHLORIDE 0.9 % IV SOLN
1.0000 g | INTRAVENOUS | Status: DC
  Administered 2020-10-09 – 2020-10-10 (×2): 1 g via INTRAVENOUS
  Filled 2020-10-09 (×2): qty 10

## 2020-10-09 MED ORDER — LORAZEPAM 2 MG/ML IJ SOLN: 1.0000 mg | INTRAMUSCULAR | Status: DC | PRN

## 2020-10-09 MED ORDER — POLYETHYLENE GLYCOL 3350 17 G PO PACK: 17.0000 g | PACK | Freq: Every day | ORAL | Status: DC | PRN

## 2020-10-09 MED ORDER — THIAMINE HCL 100 MG/ML IJ SOLN: 100.0000 mg | Freq: Every day | INTRAMUSCULAR | Status: DC

## 2020-10-09 MED ORDER — INSULIN ASPART 100 UNIT/ML IJ SOLN
0.0000 [IU] | Freq: Three times a day (TID) | INTRAMUSCULAR | Status: DC
  Administered 2020-10-09: 15 [IU] via SUBCUTANEOUS
  Administered 2020-10-10: 11 [IU] via SUBCUTANEOUS
  Administered 2020-10-10: 8 [IU] via SUBCUTANEOUS

## 2020-10-09 MED ORDER — ACETAMINOPHEN 650 MG RE SUPP: 650.0000 mg | Freq: Four times a day (QID) | RECTAL | Status: DC | PRN

## 2020-10-09 MED ORDER — THIAMINE HCL 100 MG PO TABS
100.0000 mg | ORAL_TABLET | Freq: Every day | ORAL | Status: DC
  Administered 2020-10-09 – 2020-10-10 (×2): 100 mg via ORAL
  Filled 2020-10-09 (×2): qty 1

## 2020-10-09 MED ORDER — ACETAMINOPHEN 325 MG PO TABS: 650.0000 mg | ORAL_TABLET | Freq: Four times a day (QID) | ORAL | Status: DC | PRN

## 2020-10-09 MED ORDER — METHYLPREDNISOLONE SODIUM SUCC 125 MG IJ SOLR
60.0000 mg | Freq: Two times a day (BID) | INTRAMUSCULAR | Status: DC
  Administered 2020-10-09 – 2020-10-10 (×2): 60 mg via INTRAVENOUS
  Filled 2020-10-09 (×2): qty 2

## 2020-10-09 MED ORDER — LOPERAMIDE HCL 2 MG PO CAPS: 2.0000 mg | ORAL_CAPSULE | ORAL | Status: DC | PRN

## 2020-10-09 MED ORDER — SODIUM CHLORIDE 0.9% FLUSH
3.0000 mL | Freq: Two times a day (BID) | INTRAVENOUS | Status: DC
  Administered 2020-10-09 – 2020-10-10 (×2): 3 mL via INTRAVENOUS

## 2020-10-09 MED ORDER — ADULT MULTIVITAMIN W/MINERALS CH
1.0000 | ORAL_TABLET | Freq: Every day | ORAL | Status: DC
  Administered 2020-10-09 – 2020-10-10 (×2): 1 via ORAL
  Filled 2020-10-09 (×2): qty 1

## 2020-10-09 MED ORDER — FOLIC ACID 1 MG PO TABS
1.0000 mg | ORAL_TABLET | Freq: Every day | ORAL | Status: DC
  Administered 2020-10-09 – 2020-10-10 (×2): 1 mg via ORAL
  Filled 2020-10-09 (×2): qty 1

## 2020-10-09 MED ORDER — ENOXAPARIN SODIUM 40 MG/0.4ML IJ SOSY
40.0000 mg | PREFILLED_SYRINGE | INTRAMUSCULAR | Status: DC
  Administered 2020-10-09 – 2020-10-10 (×2): 40 mg via SUBCUTANEOUS
  Filled 2020-10-09 (×2): qty 0.4

## 2020-10-09 MED ORDER — LISINOPRIL 20 MG PO TABS
40.0000 mg | ORAL_TABLET | Freq: Every day | ORAL | Status: DC
  Administered 2020-10-09 – 2020-10-10 (×2): 40 mg via ORAL
  Filled 2020-10-09 (×2): qty 2

## 2020-10-09 MED ORDER — PREDNISONE 20 MG PO TABS: 40.0000 mg | ORAL_TABLET | Freq: Every day | ORAL | Status: DC

## 2020-10-09 MED ORDER — ONDANSETRON HCL 4 MG/2ML IJ SOLN: 4.0000 mg | Freq: Four times a day (QID) | INTRAMUSCULAR | Status: DC | PRN

## 2020-10-09 MED ORDER — NICOTINE 14 MG/24HR TD PT24
14.0000 mg | MEDICATED_PATCH | Freq: Every day | TRANSDERMAL | Status: DC
  Administered 2020-10-09: 14 mg via TRANSDERMAL
  Filled 2020-10-09 (×2): qty 1
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
## 2020-10-09 NOTE — ED Triage Notes (Signed)
Patient with history of COPD and CHF, found by EMS in 86% O2 sat on room air with sudden onset x1 hour of shortness of breath.  Patient had fluid in belly per EMS.  Patient denies any chest pain.  Patient recently seen and left AMA after PNA diagnosis.  Patient with wheezing.

## 2020-10-09 NOTE — ED Notes (Signed)
Awakens easily at present to verbal, denies pain.

## 2020-10-09 NOTE — ED Provider Notes (Addendum)
In short, patient presented with a complaint of shortness of breath. Somnolent on Dr. Wende Bushy examination and would not answer questions.  Combination hypoxic and hypercarbic respiratory failure.  Placed on BiPAP.  Plan for admission of the patient.   Unclear whether the bibasilar opacities noted on chest x-ray consistently over the last several visits are due to pneumonia, edema, atelectasis.  He is not febrile, not tachycardic, not hypotensive, all of which argue against persistent pneumonia.  Clinical Course as of 10/09/20 [id number]  Sun Oct 09, 2020  0727 Spoke with Dr. Lorin Mercy, Hospitalist. Agrees to admit the patient. [SJ]    Clinical Course User Index [SJ] Faylinn Schwenn C, PA-C    Abnormal Labs Reviewed  BASIC METABOLIC PANEL - Abnormal; Notable for the following components:      Result Value   Glucose, Bld 229 (*)    Calcium 8.7 (*)    All other components within normal limits  CBC - Abnormal; Notable for the following components:   RBC 4.12 (*)    MCV 106.3 (*)    MCH 35.9 (*)    All other components within normal limits  BRAIN NATRIURETIC PEPTIDE - Abnormal; Notable for the following components:   B Natriuretic Peptide 121.8 (*)    All other components within normal limits  I-STAT VENOUS BLOOD GAS, ED - Abnormal; Notable for the following components:   pCO2, Ven 72.3 (*)    Bicarbonate 33.6 (*)    TCO2 36 (*)    Acid-Base Excess 4.0 (*)    All other components within normal limits  TROPONIN I (HIGH SENSITIVITY) - Abnormal; Notable for the following components:   Troponin I (High Sensitivity) 23 (*)    All other components within normal limits  TROPONIN I (HIGH SENSITIVITY) - Abnormal; Notable for the following components:   Troponin I (High Sensitivity) 20 (*)    All other components within normal limits    DG Chest 1 View  Result Date: 10/02/2020 CLINICAL DATA:  Pain.  Fall off toilet. EXAM: CHEST  1 VIEW COMPARISON:  09/22/2020 FINDINGS: Lower lung volumes from prior  exam. Patient's chin obscures the apices. Similar cardiomegaly. Bronchial thickening appears similar. Patchy bibasilar opacities are not significantly changed. No pneumothorax or pleural effusion. No acute osseous abnormalities are seen. IMPRESSION: Lower lung volumes from prior exam. Patchy bibasilar opacities are unchanged, atelectasis versus pneumonia. Electronically Signed   By: Keith Rake M.D.   On: 10/02/2020 03:02    DG Chest Portable 1 View  Result Date: 10/09/2020 CLINICAL DATA:  Shortness of breath. EXAM: PORTABLE CHEST 1 VIEW COMPARISON:  Chest x-ray 10/02/2020, CT chest 08/03/2009, chest x-ray 09/22/2020, chest x-ray 06/22/2020 FINDINGS: The heart size and mediastinal contours are unchanged. Aortic calcification. Low lung volumes. Persistent patchy bibasilar airspace opacities. Similar-appearing increased interstitial markings. Persistent coarsened interstitial markings. Trace left pleural effusion not excluded. No right pleural effusion. No pneumothorax. No acute osseous abnormality. IMPRESSION: 1. Persistent patchy bibasilar airspace opacities as well as increased interstitial markings. 2.  Trace left pleural effusion not excluded. Electronically Signed   By: Iven Finn M.D.   On: 10/09/2020 05:40   DG Chest Port 1 View  Result Date: 09/22/2020 CLINICAL DATA:  Shortness of breath EXAM: PORTABLE CHEST 1 VIEW COMPARISON:  06/25/2020 FINDINGS: Cardiomegaly and vascular pedicle widening. Generalized airway thickening with patchy pulmonary density that is mildly improved. Aortic tortuosity. No visible effusion or pneumothorax IMPRESSION: 1. Indistinct pulmonary infiltrates bilaterally, possible recurrent pneumonia. 2. Background of chronic bronchitic markings. Electronically  Signed   By: Monte Fantasia M.D.   On: 09/22/2020 04:09   DG Hand Complete Right  Result Date: 10/02/2020 CLINICAL DATA:  Fall, injury. EXAM: RIGHT HAND - COMPLETE 3+ VIEW COMPARISON:  None. FINDINGS: Acute  fracture of the base of the fourth proximal phalanx, minimally displaced. Distal fifth metacarpal fracture appears remote. A dressing overlies the third digit, no obvious underlying fracture. The index finger and fifth digit are held in flexion on all views. Carpal bones are partially excluded on some views. Distal radial hardware partially visualized. IMPRESSION: 1. Acute minimally displaced fracture of the base of the fourth proximal phalanx. 2. Remote appearing distal fifth metacarpal fracture. 3. A dressing overlies the third digit, no evidence of underlying fracture. Electronically Signed   By: Keith Rake M.D.   On: 10/02/2020 03:06   ECHOCARDIOGRAM LIMITED  Result Date: 09/23/2020    ECHOCARDIOGRAM LIMITED REPORT   Patient Name:   [name] Date of Exam: 09/23/2020 Medical Rec #:  [id number]     Height:       68.0 in Accession #:    [id number]    Weight:       225.7 lb Date of Birth:  1966/02/02     BSA:          2.152 m Patient Age:    55 years      BP:           146/87 mmHg Patient Gender: M             HR:           60 bpm. Exam Location:  Inpatient Procedure: Limited Echo, Cardiac Doppler and Color Doppler Indications:    CHF-Acute Diastolic  History:        Patient has prior history of Echocardiogram examinations, most                 recent 10/20/2019. CHF, COPD; Risk Factors:Current Smoker and                 Hypertension. Polysubstance abuse. Hx COVID-19.  Sonographer:    Clayton Lefort RDCS (AE) Referring Phys: [id number] RONDELL A SMITH IMPRESSIONS  1. Left ventricular ejection fraction, by estimation, is 65 to 70%. The left ventricle has normal function. There is mild concentric left ventricular hypertrophy. Left ventricular diastolic parameters are consistent with Grade I diastolic dysfunction (impaired relaxation).  2. Right ventricular systolic function is normal. The right ventricular size is normal. Tricuspid regurgitation signal is inadequate for assessing PA pressure.  3. The mitral valve is  grossly normal. No evidence of mitral valve regurgitation. No evidence of mitral stenosis.  4. The aortic valve is tricuspid. There is mild calcification of the aortic valve. There is mild thickening of the aortic valve. Aortic valve regurgitation is not visualized. Mild aortic valve sclerosis is present, with no evidence of aortic valve stenosis.  5. There is mild dilatation of the ascending aorta, measuring 42 mm.  6. The inferior vena cava is normal in size with greater than 50% respiratory variability, suggesting right atrial pressure of 3 mmHg. Comparison(s): Changes from prior study are noted. EF is now 65-70%. FINDINGS  Left Ventricle: Left ventricular ejection fraction, by estimation, is 65 to 70%. The left ventricle has normal function. The left ventricular internal cavity size was normal in size. There is mild concentric left ventricular hypertrophy. Left ventricular diastolic parameters are consistent with Grade I diastolic dysfunction (impaired relaxation). Right Ventricle: The right ventricular size is  normal. No increase in right ventricular wall thickness. Right ventricular systolic function is normal. Tricuspid regurgitation signal is inadequate for assessing PA pressure. Pericardium: Trivial pericardial effusion is present. Presence of pericardial fat pad. Mitral Valve: The mitral valve is grossly normal. No evidence of mitral valve stenosis. Tricuspid Valve: The tricuspid valve is grossly normal. Tricuspid valve regurgitation is trivial. No evidence of tricuspid stenosis. Aortic Valve: The aortic valve is tricuspid. There is mild calcification of the aortic valve. There is mild thickening of the aortic valve. Aortic valve regurgitation is not visualized. Mild aortic valve sclerosis is present, with no evidence of aortic valve stenosis. Aortic valve mean gradient measures 9.0 mmHg. Aortic valve peak gradient measures 14.3 mmHg. Aortic valve area, by VTI measures 2.62 cm. Pulmonic Valve: The  pulmonic valve was grossly normal. Pulmonic valve regurgitation is not visualized. No evidence of pulmonic stenosis. Aorta: The aortic root is normal in size and structure. There is mild dilatation of the ascending aorta, measuring 42 mm. Venous: The inferior vena cava is normal in size with greater than 50% respiratory variability, suggesting right atrial pressure of 3 mmHg. LEFT VENTRICLE PLAX 2D LVIDd:         4.80 cm  Diastology LVIDs:         3.10 cm  LV e' medial:    4.46 cm/s LV PW:         1.29 cm  LV E/e' medial:  16.2 LV IVS:        1.20 cm  LV e' lateral:   7.29 cm/s LVOT diam:     2.10 cm  LV E/e' lateral: 9.9 LV SV:         91 LV SV Index:   42 LVOT Area:     3.46 cm  IVC IVC diam: 2.10 cm LEFT ATRIUM         Index LA diam:    4.20 cm 1.95 cm/m  AORTIC VALVE AV Area (Vmax):    2.75 cm AV Area (Vmean):   2.60 cm AV Area (VTI):     2.62 cm AV Vmax:           189.00 cm/s AV Vmean:          140.000 cm/s AV VTI:            0.349 m AV Peak Grad:      14.3 mmHg AV Mean Grad:      9.0 mmHg LVOT Vmax:         150.00 cm/s LVOT Vmean:        105.000 cm/s LVOT VTI:          0.264 m LVOT/AV VTI ratio: 0.76  AORTA Ao Root diam: 3.90 cm Ao Asc diam:  4.20 cm MITRAL VALVE MV Area (PHT): 2.60 cm    SHUNTS MV Decel Time: 292 msec    Systemic VTI:  0.26 m MV E velocity: 72.40 cm/s  Systemic Diam: 2.10 cm MV A velocity: 70.70 cm/s MV E/A ratio:  1.02 Eleonore Chiquito MD Electronically signed by Eleonore Chiquito MD Signature Date/Time: 09/23/2020/3:08:08 PM    Final       Lorayne Bender, PA-C 10/09/20 0737    Lorayne Bender, PA-C 10/09/20 0737    Maudie Flakes, MD 10/13/20 [phone]

## 2020-10-09 NOTE — ED Notes (Signed)
Pt on BiPAP. This RN called RT. RT stated that they will come down to start Duoneb.
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
## 2020-10-09 NOTE — Progress Notes (Signed)
RT went in the room to put pt on the BIPAP. pts O2 sats in 86-87. Pt placed on the BIPAP per order. He is tolerating it well at this time.

## 2020-10-09 NOTE — ED Notes (Addendum)
Sitting up in bed, eating per MD order. Pleasant. Denies pain/discomfort at present.

## 2020-10-10 DIAGNOSIS — Z03818 Encounter for observation for suspected exposure to other biological agents ruled out: Secondary | ICD-10-CM | POA: Diagnosis not present

## 2020-10-10 LAB — CBC
HCT: 45.4 % (ref 39.0–52.0)
Hemoglobin: 15.1 g/dL (ref 13.0–17.0)
MCH: 35.4 pg — ABNORMAL HIGH (ref 26.0–34.0)
MCHC: 33.3 g/dL (ref 30.0–36.0)
MCV: 106.3 fL — ABNORMAL HIGH (ref 80.0–100.0)
Platelets: 247 10*3/uL (ref 150–400)
RBC: 4.27 MIL/uL (ref 4.22–5.81)
RDW: 14.7 % (ref 11.5–15.5)
WBC: 11 10*3/uL — ABNORMAL HIGH (ref 4.0–10.5)
nRBC: 0 % (ref 0.0–0.2)

## 2020-10-10 LAB — BASIC METABOLIC PANEL
Anion gap: 8 (ref 5–15)
BUN: 19 mg/dL (ref 6–20)
CO2: 29 mmol/L (ref 22–32)
Calcium: 8.7 mg/dL — ABNORMAL LOW (ref 8.9–10.3)
Chloride: 99 mmol/L (ref 98–111)
Creatinine, Ser: 1.03 mg/dL (ref 0.61–1.24)
GFR, Estimated: >60 mL/min (ref >60)
Glucose, Bld: 168 mg/dL — ABNORMAL HIGH (ref 70–99)
Potassium: 4.4 mmol/L (ref 3.5–5.1)
Sodium: 136 mmol/L (ref 135–145)

## 2020-10-10 LAB — MRSA PCR SCREENING: MRSA by PCR: NEGATIVE

## 2020-10-10 LAB — GLUCOSE, CAPILLARY
Glucose-Capillary: 239 mg/dL — ABNORMAL HIGH (ref 70–99)
Glucose-Capillary: 317 mg/dL — ABNORMAL HIGH (ref 70–99)

## 2020-10-10 LAB — HEMOGLOBIN A1C
Hgb A1c MFr Bld: 7.3 % — ABNORMAL HIGH (ref 4.8–5.6)
Mean Plasma Glucose: 162.81 mg/dL

## 2020-10-10 MED ORDER — AMLODIPINE BESYLATE 5 MG PO TABS: 5.0000 mg | ORAL_TABLET | Freq: Every day | ORAL | Status: DC

## 2020-10-10 MED ORDER — DOXYCYCLINE HYCLATE 100 MG PO CAPS: 100.0000 mg | ORAL_CAPSULE | Freq: Two times a day (BID) | ORAL | 0 refills | Status: DC

## 2020-10-10 MED ORDER — PREDNISONE 20 MG PO TABS: 40.0000 mg | ORAL_TABLET | Freq: Every day | ORAL | 0 refills | Status: DC

## 2020-10-10 MED ORDER — METFORMIN HCL 500 MG PO TABS: 500.0000 mg | ORAL_TABLET | Freq: Two times a day (BID) | ORAL | 0 refills | Status: DC

## 2020-10-10 NOTE — Progress Notes (Signed)
Pt noted to be in SB with occasional premature ventricular complexes throughout the night while sleeping/snoring. Lowest HR was in the 30s. EKG done. Notified physician. No new orders. Will continue to monitor pt.

## 2020-10-10 NOTE — TOC Initial Note (Signed)
Transition of Care [facility]) - Initial/Assessment Note    Patient Details  Name: [name] MRN: [id number] Date of Birth: 1966-02-07  Transition of Care [facility]) CM/SW Contact:    Zenon Mayo, RN Phone Number: 10/10/2020, 11:56 AM  Clinical Narrative:                 NCM spoke with patient, medications were sent to [place] [facility] [place], he states he does not have any funds to pay for them.  NCM contacted [employer] , they said they can waive the copay this time for him.  Also he states he is homeless and will be staying with some friends.  He states he may need transport at dc. Also NCM gave substance abuse resources to Staff RN to give to patient.   Expected Discharge Plan: Home/Self Care Barriers to Discharge: No Barriers Identified   Patient Goals and CMS Choice Patient states their goals for this hospitalization and ongoing recovery are:: return home with friends   Choice offered to / list presented to : NA  Expected Discharge Plan and Services Expected Discharge Plan: Home/Self Care In-house Referral: NA Discharge Planning Services: CM Consult Post Acute Care Choice: NA Living arrangements for the past 2 months: Homeless Expected Discharge Date: 10/10/20                 DME Agency: NA       HH Arranged: NA          Prior Living Arrangements/Services Living arrangements for the past 2 months: Homeless Lives with:: Friends Patient language and need for interpreter reviewed:: Yes Do you feel safe going back to the place where you live?: Yes      Need for Family Participation in Patient Care: No (Comment) Care giver support system in place?: No (comment)   Criminal Activity/Legal Involvement Pertinent to Current Situation/Hospitalization: No - Comment as needed  Activities of Daily Living      Permission Sought/Granted                  Emotional Assessment   Attitude/Demeanor/Rapport: Engaged Affect (typically observed):  Appropriate Orientation: : Oriented to Self,Oriented to Place,Oriented to  Time,Oriented to Situation Alcohol / Substance Use: Alcohol Use,Illicit Drugs Psych Involvement: No (comment)  Admission diagnosis:  Acute respiratory failure with hypoxia and hypercapnia ([place]) [J96.01, J96.02] Patient Active Problem List   Diagnosis Date Noted  . Obesity hypoventilation syndrome ([place]) 10/02/2020  . Acute respiratory failure with hypercapnia ([place]) 09/22/2020  . History of COVID-19 09/22/2020  . Abnormal chest x-ray 09/22/2020  . Obesity (BMI 30.0-34.9) 09/22/2020  . HTN (hypertension) 06/25/2020  . Acute on chronic respiratory failure with hypoxia and hypercapnia ([place]) 06/25/2020  . Pneumonia due to COVID-19 virus 06/22/2020  . Acute respiratory failure with hypoxia ([place]) 06/22/2020  . Acute heart failure with preserved ejection fraction (HFpEF) ([place]) 11/30/2019  . Acute on chronic diastolic CHF (congestive heart failure) ([place])   . Uncontrolled hypertension 10/21/2019  . COPD with acute exacerbation ([place]) 10/20/2019  . COPD exacerbation ([place]) 10/19/2019  . Sinus bradycardia   . Acute exacerbation of chronic obstructive pulmonary disease (COPD) ([place]) 10/17/2019  . Elevated liver enzymes 06/11/2017  . Abnormal liver function tests 06/10/2017  . Chronic diastolic CHF (congestive heart failure) ([place]) 06/10/2017  . Hypokalemia 06/10/2017  . Hypomagnesemia 06/10/2017  . Vertebral osteomyelitis, chronic ([place]) 05/31/2017  . Marijuana use 05/31/2017  . Osteomyelitis ([place]) 05/24/2017  . Discitis 05/24/2017  . Diskitis 03/05/2017  . Cocaine  use   . Alcohol abuse   . Substance abuse ([place])   . Epidural abscess   . Discitis thoracic region 02/26/2017  . Thoracic back pain   . Tobacco abuse   . Essential hypertension   . Fungal endocarditis   . Suicidal ideation   . IVDU (intravenous drug user)   . Chronic bilateral low back pain without sciatica   . 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[place])   . Vertebral  osteomyelitis ([place]) 02/16/2017  . Cocaine abuse ([place]) 10/09/2015  . Homeless single person   . COPD (chronic obstructive pulmonary disease) ([place]) 10/08/2015   PCP:  [name], NP Pharmacy:   [facility] ([place]), [place] - [address] [place]  [place] [address] Phone: [phone] Fax: [phone]     Social Determinants of Health (SDOH) Interventions    Readmission Risk Interventions Readmission Risk Prevention Plan 10/10/2020 06/27/2020 10/21/2019  Transportation Screening Complete Complete Complete  PCP or Specialist Appt within 3-5 Days - - Complete  HRI or [place] - - Complete  Social Work Consult for [place] Planning/Counseling - - Complete  Palliative Care Screening - - Complete  Medication Review Press photographer) Complete Complete Complete  PCP or Specialist appointment within 3-5 days of discharge Complete Complete -  [place] or Home Care Consult Complete Complete -  SW Recovery Care/Counseling Consult Complete Complete -  Palliative Care Screening Not Applicable Not Applicable -  [place] Not Applicable Not Applicable -  Some recent data might be hidden

## 2020-10-10 NOTE — Discharge Summary (Signed)
Physician Discharge Summary  [name] NTZ:[id number] DOB: 04-07-1966 DOA: 10/09/2020  PCP: [name], NP  Admit date: 10/09/2020 Discharge date: 10/10/2020  Admitted From: home Discharge disposition: home   Recommendations for Outpatient Follow-Up:   1. Cocaine cessation 2. Needs to take him home meds   Discharge Diagnosis:   Principal Problem:   Acute on chronic respiratory failure with hypoxia and hypercapnia (HCC) Active Problems:   COPD (chronic obstructive pulmonary disease) (HCC)   Cocaine abuse (HCC)   IVDU (intravenous drug user)   Tobacco abuse   Essential hypertension   Chronic diastolic CHF (congestive heart failure) (HCC)   Obesity (BMI 30.0-34.9)   Obesity hypoventilation syndrome ([place])    Discharge Condition: Improved.  Diet recommendation: Low sodium, heart healthy.  Carbohydrate-modified  Wound care: None.  Code status: Full.   History of Present Illness:   [name] is a 55 y.o. male with medical history significant of HTN; chronic diastolic CHF; ETOH dependence; IVDA with h/o vertebral osteomyelitis; and COPD with ongoing tobacco use presenting with SOB.  He was last admitted from 5/15-16 and left AMA.  He was hospitalized for acute on chronic respiratory failure with hypoxia and hypercapnia which is thought to be multifactorial and related to OSA, OHS, and COPD exacerbation in the setting of persistent polysubstance abuse.  Per TCM notes, the patient is transient in his housing situation, living with different friends.  He has home O2 but it is at his mother's house and he doesn't stay there.  He did not pick up his medications from his last hospitalization or any of his medications because he cannot afford the $3 each per medication charged by Medicaid.  He has PCP f/u scheduled for 5/31.  The patient is currently on BIPAP and rouses to deep stimulation and then lapses back to sleep.  He was unable to answer any  questions.   Hospital Course by Problem:   Acute on chronic respiratory failure with hypoxia and hypercapnia -Patient with documented hypoxia only to 93% but with pCO2 72 and significant somnolence -He was placed on BIPAP with marked improvement, repeat pCO2 51.6 -resolved -multifactorial but responded quickly to lasix  Chronic diastolic CHF -responded well to IV lasix -weigh at home daily -lasix daily  Polysubstance abuse -encourage cessation  HTN -stop norvasc but continue others  Hypergylcemia -Glucose 229 -added metformin -will need close follow up  Tobacco dependence -Encourage cessation.     -Patch ordered   Obesity -BMI 34.63 -Weight loss should be encouraged     Medical Consultants:      Discharge Exam:   Vitals:   10/10/20 0400 10/10/20 0734  BP: 115/76 (!) 113/58  Pulse:    Resp: (!) 31 17  Temp:  97.7 F (36.5 C)  SpO2:     Vitals:   10/10/20 0200 10/10/20 0300 10/10/20 0400 10/10/20 0734  BP: 128/76 125/77 115/76 (!) 113/58  Pulse:      Resp: (!) 23 17 (!) 31 17  Temp:    97.7 F (36.5 C)  TempSrc:      SpO2:      Weight:   102.4 kg   Height:        General exam: Appears calm and comfortable. insistent on going home   The results of significant diagnostics from this hospitalization (including imaging, microbiology, ancillary and laboratory) are listed below for reference.     Procedures and Diagnostic Studies:   DG Chest Portable 1 View  Result Date: 10/09/2020 CLINICAL DATA:  Shortness of breath. EXAM: PORTABLE CHEST 1 VIEW COMPARISON:  Chest x-ray 10/02/2020, CT chest 08/03/2009, chest x-ray 09/22/2020, chest x-ray 06/22/2020 FINDINGS: The heart size and mediastinal contours are unchanged. Aortic calcification. Low lung volumes. Persistent patchy bibasilar airspace opacities. Similar-appearing increased interstitial markings. Persistent coarsened interstitial markings. Trace left pleural effusion not excluded. No right  pleural effusion. No pneumothorax. No acute osseous abnormality. IMPRESSION: 1. Persistent patchy bibasilar airspace opacities as well as increased interstitial markings. 2.  Trace left pleural effusion not excluded. Electronically Signed   By: Iven Finn M.D.   On: 10/09/2020 05:40     Labs:   Basic Metabolic Panel: Recent Labs  Lab 10/09/20 0435 10/09/20 0631 10/09/20 0809 10/10/20 0108  NA 137 140 140 136  K 4.1 4.1 4.1 4.4  CL 100  --   --  99  CO2 32  --   --  29  GLUCOSE 229*  --   --  168*  BUN 15  --   --  19  CREATININE 0.95  --   --  1.03  CALCIUM 8.7*  --   --  8.7*   GFR Estimated Creatinine Clearance: 92.9 mL/min (by C-G formula based on SCr of 1.03 mg/dL). Liver Function Tests: No results for input(s): AST, ALT, ALKPHOS, BILITOT, PROT, ALBUMIN in the last 168 hours. No results for input(s): LIPASE, AMYLASE in the last 168 hours. No results for input(s): AMMONIA in the last 168 hours. Coagulation profile No results for input(s): INR, PROTIME in the last 168 hours.  CBC: Recent Labs  Lab 10/09/20 0435 10/09/20 0631 10/09/20 0809 10/10/20 0108  WBC 7.6  --   --  11.0*  HGB 14.8 16.0 14.3 15.1  HCT 43.8 47.0 42.0 45.4  MCV 106.3*  --   --  106.3*  PLT 242  --   --  247   Cardiac Enzymes: No results for input(s): CKTOTAL, CKMB, CKMBINDEX, TROPONINI in the last 168 hours. BNP: Invalid input(s): POCBNP CBG: Recent Labs  Lab 10/09/20 1139 10/09/20 1653 10/09/20 2038 10/10/20 0619 10/10/20 1134  GLUCAP 338* 331* 416* 239* 317*   D-Dimer No results for input(s): DDIMER in the last 72 hours. Hgb A1c Recent Labs    10/10/20 0108  HGBA1C 7.3*   Lipid Profile No results for input(s): CHOL, HDL, LDLCALC, TRIG, CHOLHDL, LDLDIRECT in the last 72 hours. Thyroid function studies No results for input(s): TSH, T4TOTAL, T3FREE, THYROIDAB in the last 72 hours.  Invalid input(s): FREET3 Anemia work up No results for input(s): VITAMINB12, FOLATE,  FERRITIN, TIBC, IRON, RETICCTPCT in the last 72 hours. Microbiology Recent Results (from the past 240 hour(s))  Resp Panel by RT-PCR (Flu A&B, Covid) Nasopharyngeal Swab     Status: None   Collection Time: 10/02/20  4:10 AM   Specimen: Nasopharyngeal Swab; Nasopharyngeal(NP) swabs in vial transport medium  Result Value Ref Range Status   SARS Coronavirus 2 by RT PCR NEGATIVE NEGATIVE Final    Comment: (NOTE) SARS-CoV-2 target nucleic acids are NOT DETECTED.  The SARS-CoV-2 RNA is generally detectable in upper respiratory specimens during the acute phase of infection. The lowest concentration of SARS-CoV-2 viral copies this assay can detect is 138 copies/mL. A negative result does not preclude SARS-Cov-2 infection and should not be used as the sole basis for treatment or other patient management decisions. A negative result may occur with  improper specimen collection/handling, submission of specimen other than nasopharyngeal swab, presence of viral mutation(s)  within the areas targeted by this assay, and inadequate number of viral copies(<138 copies/mL). A negative result must be combined with clinical observations, patient history, and epidemiological information. The expected result is Negative.  Fact Sheet for Patients:  EntrepreneurPulse.com.au  Fact Sheet for Healthcare Providers:  IncredibleEmployment.be  This test is no t yet approved or cleared by the [place] FDA and  has been authorized for detection and/or diagnosis of SARS-CoV-2 by FDA under an Emergency Use Authorization (EUA). This EUA will remain  in effect (meaning this test can be used) for the duration of the COVID-19 declaration under Section 564(b)(1) of the Act, 21 U.S.C.section 360bbb-3(b)(1), unless the authorization is terminated  or revoked sooner.       Influenza A by PCR NEGATIVE NEGATIVE Final   Influenza B by PCR NEGATIVE NEGATIVE Final    Comment:  (NOTE) The Xpert Xpress SARS-CoV-2/FLU/RSV plus assay is intended as an aid in the diagnosis of influenza from Nasopharyngeal swab specimens and should not be used as a sole basis for treatment. Nasal washings and aspirates are unacceptable for Xpert Xpress SARS-CoV-2/FLU/RSV testing.  Fact Sheet for Patients: EntrepreneurPulse.com.au  Fact Sheet for Healthcare Providers: IncredibleEmployment.be  This test is not yet approved or cleared by the [place] FDA and has been authorized for detection and/or diagnosis of SARS-CoV-2 by FDA under an Emergency Use Authorization (EUA). This EUA will remain in effect (meaning this test can be used) for the duration of the COVID-19 declaration under Section 564(b)(1) of the Act, 21 U.S.C. section 360bbb-3(b)(1), unless the authorization is terminated or revoked.  Performed at [facility], [place] [address] [place], [place] [address]   Resp Panel by RT-PCR (Flu A&B, Covid) Nasopharyngeal Swab     Status: None   Collection Time: 10/09/20  7:06 AM   Specimen: Nasopharyngeal Swab; Nasopharyngeal(NP) swabs in vial transport medium  Result Value Ref Range Status   SARS Coronavirus 2 by RT PCR NEGATIVE NEGATIVE Final    Comment: (NOTE) SARS-CoV-2 target nucleic acids are NOT DETECTED.  The SARS-CoV-2 RNA is generally detectable in upper respiratory specimens during the acute phase of infection. The lowest concentration of SARS-CoV-2 viral copies this assay can detect is 138 copies/mL. A negative result does not preclude SARS-Cov-2 infection and should not be used as the sole basis for treatment or other patient management decisions. A negative result may occur with  improper specimen collection/handling, submission of specimen other than nasopharyngeal swab, presence of viral mutation(s) within the areas targeted by this assay, and inadequate number of viral copies(<138 copies/mL). A negative result  must be combined with clinical observations, patient history, and epidemiological information. The expected result is Negative.  Fact Sheet for Patients:  EntrepreneurPulse.com.au  Fact Sheet for Healthcare Providers:  IncredibleEmployment.be  This test is no t yet approved or cleared by the [place] FDA and  has been authorized for detection and/or diagnosis of SARS-CoV-2 by FDA under an Emergency Use Authorization (EUA). This EUA will remain  in effect (meaning this test can be used) for the duration of the COVID-19 declaration under Section 564(b)(1) of the Act, 21 U.S.C.section 360bbb-3(b)(1), unless the authorization is terminated  or revoked sooner.       Influenza A by PCR NEGATIVE NEGATIVE Final   Influenza B by PCR NEGATIVE NEGATIVE Final    Comment: (NOTE) The Xpert Xpress SARS-CoV-2/FLU/RSV plus assay is intended as an aid in the diagnosis of influenza from Nasopharyngeal swab specimens and should not be used as  a sole basis for treatment. Nasal washings and aspirates are unacceptable for Xpert Xpress SARS-CoV-2/FLU/RSV testing.  Fact Sheet for Patients: EntrepreneurPulse.com.au  Fact Sheet for Healthcare Providers: IncredibleEmployment.be  This test is not yet approved or cleared by the [place] FDA and has been authorized for detection and/or diagnosis of SARS-CoV-2 by FDA under an Emergency Use Authorization (EUA). This EUA will remain in effect (meaning this test can be used) for the duration of the COVID-19 declaration under Section 564(b)(1) of the Act, 21 U.S.C. section 360bbb-3(b)(1), unless the authorization is terminated or revoked.  Performed at [facility], [place] [address], [place], [place] [address]   MRSA PCR Screening     Status: None   Collection Time: 10/09/20  6:36 PM   Specimen: Nasopharyngeal  Result Value Ref Range Status   MRSA by PCR NEGATIVE  NEGATIVE Final    Comment:        The GeneXpert MRSA Assay (FDA approved for NASAL specimens only), is one component of a comprehensive MRSA colonization surveillance program. It is not intended to diagnose MRSA infection nor to guide or monitor treatment for MRSA infections. Performed at [facility], [place] [address] [place], [place] [address]      Discharge Instructions:   Discharge Instructions    (HEART FAILURE PATIENTS) Call MD:  Anytime you have any of the following symptoms: 1) 3 pound weight gain in 24 hours or 5 pounds in 1 week 2) shortness of breath, with or without a dry hacking cough 3) swelling in the hands, feet or stomach 4) if you have to sleep on extra pillows at night in order to breathe.   Complete by: As directed    Diet - low sodium heart healthy   Complete by: As directed    Diet Carb Modified   Complete by: As directed    Discharge instructions   Complete by: As directed    Stop using cocaine and smoking   Heart Failure patients record your daily weight using the same scale at the same time of day   Complete by: As directed    Increase activity slowly   Complete by: As directed      Allergies as of 10/10/2020      Reactions   Penicillins    Has tolerated cefepime 5/22 From childhood: Has patient had a PCN reaction causing immediate rash, facial/tongue/throat swelling, SOB or lightheadedness with hypotension: Unknown Has patient had a PCN reaction causing severe rash involving mucus membranes or skin necrosis: Unknown Has patient had a PCN reaction that required hospitalization: Unknown Has patient had a PCN reaction occurring within the last 10 years: No If all of the above answers are "NO", then may proceed with Cephalosporin use.      Medication List    STOP taking these medications   amLODipine 10 MG tablet Commonly known as: NORVASC   Suboxone 8-2 MG Film Generic drug: Buprenorphine HCl-Naloxone HCl     TAKE these medications    albuterol 108 (90 Base) MCG/ACT inhaler Commonly known as: VENTOLIN HFA Inhale 2 puffs into the lungs every 4 (four) hours as needed for wheezing or shortness of breath (cough).   budesonide-formoterol 160-4.5 MCG/ACT inhaler Commonly known as: Symbicort Inhale 2 puffs into the lungs in the morning and at bedtime.   doxycycline 100 MG capsule Commonly known as: VIBRAMYCIN Take 1 capsule (100 mg total) by mouth 2 (two) times daily.   folic acid 1 MG tablet Commonly known  as: FOLVITE Take 1 tablet (1 mg total) by mouth daily.   furosemide 40 MG tablet Commonly known as: LASIX Take 1 tablet (40 mg total) by mouth 2 (two) times daily.   lisinopril 40 MG tablet Commonly known as: ZESTRIL Take 1 tablet (40 mg total) by mouth daily.   metFORMIN 500 MG tablet Commonly known as: Glucophage Take 1 tablet (500 mg total) by mouth 2 (two) times daily with a meal.   nicotine 7 mg/24hr patch Commonly known as: NICODERM CQ - dosed in mg/24 hr Place 1 patch (7 mg total) onto the skin daily.   predniSONE 20 MG tablet Commonly known as: DELTASONE Take 2 tablets (40 mg total) by mouth daily with breakfast. Start taking on: Oct 11, 2020 What changed:   how much to take  how to take this  when to take this  additional instructions         Time coordinating discharge: 35 min  Signed:  Geradine Girt DO  [facility] 10/10/2020, 11:42 AM

## 2020-10-10 NOTE — Plan of Care (Signed)

## 2020-10-10 NOTE — TOC Transition Note (Signed)
Transition of Care [facility]) - CM/SW Discharge Note   Patient Details  Name: [name] MRN: [id number] Date of Birth: 1965-07-09  [facility] CM/SW Contact:  Zenon Mayo, RN Phone Number: 10/10/2020, 11:59 AM   Clinical Narrative:    For discharge today. NCM spoke with patient, medications were sent to [place] at [facility], he states he does not have any funds to pay for them.  NCM contacted [facility] , they said they can waive the copay this time for him.  Also he states he is homeless and will be staying with some friends.  He states he may need transport at dc. Also NCM gave substance abuse resources to Staff RN to give to patient.     Final next level of care: Home/Self Care Barriers to Discharge: No Barriers Identified   Patient Goals and CMS Choice Patient states their goals for this hospitalization and ongoing recovery are:: return home with friends   Choice offered to / list presented to : NA  Discharge Placement                       Discharge Plan and Services In-house Referral: NA Discharge Planning Services: CM Consult Post Acute Care Choice: NA            DME Agency: NA       HH Arranged: NA          Social Determinants of Health (SDOH) Interventions     Readmission Risk Interventions Readmission Risk Prevention Plan 10/10/2020 06/27/2020 10/21/2019  Transportation Screening Complete Complete Complete  PCP or Specialist Appt within 3-5 Days - - Complete  HRI or [place] - - Complete  Social Work Consult for [place] Planning/Counseling - - Complete  Palliative Care Screening - - Complete  Medication Review Press photographer) Complete Complete Complete  PCP or Specialist appointment within 3-5 days of discharge Complete Complete -  [place] or Home Care Consult Complete Complete -  SW Recovery Care/Counseling Consult Complete Complete -  Palliative Care Screening Not Applicable Not Applicable -  [place] Not Applicable Not Applicable -  Some recent data might be hidden

## 2020-10-10 NOTE — Progress Notes (Signed)
SATURATION QUALIFICATIONS: (This note is used to comply with regulatory documentation for home oxygen)  Patient Saturations on Room Air at Rest = 95%  Patient Saturations on Room Air while Ambulating = 90%  Patient Saturations on n/a Liters of oxygen while Ambulating = n/a%  Please briefly explain why patient needs home oxygen: n/a

## 2020-10-11 ENCOUNTER — Telehealth: Payer: Self-pay

## 2020-10-11 NOTE — Telephone Encounter (Signed)
Transition Care Management Unsuccessful Follow-up Telephone Call  Date of discharge and from where:  10/10/2020, [facility]   Attempts:  1st Attempt  Reason for unsuccessful TCM follow-up call:  Left voice message on # [phone]. Call also placed to # [phone] and the phone just rings, no option for voicemail.   Patient has appointment with Juluis Mire , NP 10/18/2020.

## 2020-10-12 ENCOUNTER — Telehealth: Payer: Self-pay

## 2020-10-12 NOTE — Telephone Encounter (Signed)
Transition Care Management Follow-up Telephone Call  Date of discharge and from where: 10/10/2020, [facility]   How have you been since you were released from the hospital? Doing okay  Any questions or concerns? No  Items Reviewed:  Did the pt receive and understand the discharge instructions provided? Yes   Medications obtained and verified? Yes  - he said he has all of his medications and did not have any questions about the med regime. The hospital had contacted his pharmacy to request that the copays be waived so he could obtain his medications   Other? No   Any new allergies since your discharge? No   Do you have support at home? no permanent residence. Has been staying different palce wiht friends.   Home Care and Equipment/Supplies: Were home health services ordered? no If so, what is the name of the agency? n/a  Has the agency set up a time to come to the patient's home? not applicable Were any new equipment or medical supplies ordered?  No What is the name of the medical supply agency? n/a Were you able to get the supplies/equipment? not applicable Do you have any questions related to the use of the equipment or supplies? No   His O2 is still at his mother's house; but he is not staying there.   Functional Questionnaire: (I = Independent and D = Dependent) ADLs: I     Follow up appointments reviewed:   PCP Hospital f/u appt confirmed? Yes  Juluis Mire, NP 10/18/2020.    [facility] f/u appt confirmed? No , none scheduled at this time  Are transportation arrangements needed? No   If their condition worsens, is the pt aware to call PCP or go to the Emergency Dept.? Yes  Was the patient provided with contact information for the PCP's office or ED? Yes  Was to pt encouraged to call back with questions or concerns? Yes

## 2020-10-17 DIAGNOSIS — Z03818 Encounter for observation for suspected exposure to other biological agents ruled out: Secondary | ICD-10-CM | POA: Diagnosis not present

## 2020-10-18 ENCOUNTER — Ambulatory Visit (INDEPENDENT_AMBULATORY_CARE_PROVIDER_SITE_OTHER): Payer: Medicaid Other | Admitting: Primary Care

## 2020-10-24 DIAGNOSIS — Z03818 Encounter for observation for suspected exposure to other biological agents ruled out: Secondary | ICD-10-CM | POA: Diagnosis not present

## 2020-10-31 DIAGNOSIS — Z03818 Encounter for observation for suspected exposure to other biological agents ruled out: Secondary | ICD-10-CM | POA: Diagnosis not present

## 2020-11-07 DIAGNOSIS — Z03818 Encounter for observation for suspected exposure to other biological agents ruled out: Secondary | ICD-10-CM | POA: Diagnosis not present

## 2020-11-10 ENCOUNTER — Emergency Department (HOSPITAL_COMMUNITY)
Admission: EM | Admit: 2020-11-10 | Discharge: 2020-11-10 | Discharge status: Against Medical Advice | Payer: Medicaid Other | Attending: Emergency Medicine | Admitting: Emergency Medicine

## 2020-11-10 ENCOUNTER — Emergency Department (HOSPITAL_COMMUNITY): Payer: Medicaid Other

## 2020-11-10 ENCOUNTER — Encounter (HOSPITAL_COMMUNITY): Payer: Self-pay | Admitting: *Deleted

## 2020-11-10 DIAGNOSIS — J441 Chronic obstructive pulmonary disease with (acute) exacerbation: Secondary | ICD-10-CM | POA: Insufficient documentation

## 2020-11-10 DIAGNOSIS — I5033 Acute on chronic diastolic (congestive) heart failure: Secondary | ICD-10-CM | POA: Diagnosis not present

## 2020-11-10 DIAGNOSIS — R404 Transient alteration of awareness: Secondary | ICD-10-CM | POA: Diagnosis not present

## 2020-11-10 DIAGNOSIS — Z8616 Personal history of COVID-19: Secondary | ICD-10-CM | POA: Diagnosis not present

## 2020-11-10 DIAGNOSIS — F1910 Other psychoactive substance abuse, uncomplicated: Secondary | ICD-10-CM

## 2020-11-10 DIAGNOSIS — F1490 Cocaine use, unspecified, uncomplicated: Secondary | ICD-10-CM | POA: Insufficient documentation

## 2020-11-10 DIAGNOSIS — Z20822 Contact with and (suspected) exposure to covid-19: Secondary | ICD-10-CM | POA: Diagnosis not present

## 2020-11-10 DIAGNOSIS — I10 Essential (primary) hypertension: Secondary | ICD-10-CM | POA: Diagnosis not present

## 2020-11-10 DIAGNOSIS — R400 Somnolence: Secondary | ICD-10-CM | POA: Diagnosis not present

## 2020-11-10 DIAGNOSIS — I110 Hypertensive heart disease with heart failure: Secondary | ICD-10-CM | POA: Insufficient documentation

## 2020-11-10 DIAGNOSIS — R531 Weakness: Secondary | ICD-10-CM | POA: Diagnosis not present

## 2020-11-10 DIAGNOSIS — J811 Chronic pulmonary edema: Secondary | ICD-10-CM | POA: Diagnosis not present

## 2020-11-10 DIAGNOSIS — R0689 Other abnormalities of breathing: Secondary | ICD-10-CM

## 2020-11-10 DIAGNOSIS — E872 Acidosis, unspecified: Secondary | ICD-10-CM

## 2020-11-10 DIAGNOSIS — R0902 Hypoxemia: Secondary | ICD-10-CM | POA: Diagnosis not present

## 2020-11-10 DIAGNOSIS — F1190 Opioid use, unspecified, uncomplicated: Secondary | ICD-10-CM | POA: Insufficient documentation

## 2020-11-10 DIAGNOSIS — I509 Heart failure, unspecified: Secondary | ICD-10-CM | POA: Diagnosis not present

## 2020-11-10 LAB — BLOOD GAS, VENOUS
Acid-Base Excess: 3 mmol/L — ABNORMAL HIGH (ref 0.0–2.0)
Bicarbonate: 35.1 mmol/L — ABNORMAL HIGH (ref 20.0–28.0)
O2 Saturation: 56.5 %
Patient temperature: 98.6
pCO2, Ven: 93.9 mmHg (ref 44.0–60.0)
pH, Ven: 7.197 — CL (ref 7.250–7.430)
pO2, Ven: 39.9 mmHg (ref 32.0–45.0)

## 2020-11-10 LAB — CBC
HCT: 45.2 % (ref 39.0–52.0)
Hemoglobin: 14.9 g/dL (ref 13.0–17.0)
MCH: 35.8 pg — ABNORMAL HIGH (ref 26.0–34.0)
MCHC: 33 g/dL (ref 30.0–36.0)
MCV: 108.7 fL — ABNORMAL HIGH (ref 80.0–100.0)
Platelets: 167 10*3/uL (ref 150–400)
RBC: 4.16 MIL/uL — ABNORMAL LOW (ref 4.22–5.81)
RDW: 14.6 % (ref 11.5–15.5)
WBC: 7.4 10*3/uL (ref 4.0–10.5)
nRBC: 0 % (ref 0.0–0.2)

## 2020-11-10 LAB — COMPREHENSIVE METABOLIC PANEL
ALT: 43 U/L (ref 0–44)
AST: 32 U/L (ref 15–41)
Albumin: 4.1 g/dL (ref 3.5–5.0)
Alkaline Phosphatase: 50 U/L (ref 38–126)
Anion gap: 8 (ref 5–15)
BUN: 14 mg/dL (ref 6–20)
CO2: 29 mmol/L (ref 22–32)
Calcium: 9.1 mg/dL (ref 8.9–10.3)
Chloride: 99 mmol/L (ref 98–111)
Creatinine, Ser: 1.03 mg/dL (ref 0.61–1.24)
GFR, Estimated: >60 mL/min (ref >60)
Glucose, Bld: 145 mg/dL — ABNORMAL HIGH (ref 70–99)
Potassium: 4.1 mmol/L (ref 3.5–5.1)
Sodium: 136 mmol/L (ref 135–145)
Total Bilirubin: 1.5 mg/dL — ABNORMAL HIGH (ref 0.3–1.2)
Total Protein: 7.4 g/dL (ref 6.5–8.1)

## 2020-11-10 LAB — RESP PANEL BY RT-PCR (FLU A&B, COVID) ARPGX2
Influenza A by PCR: NEGATIVE
Influenza B by PCR: NEGATIVE
SARS Coronavirus 2 by RT PCR: NEGATIVE

## 2020-11-10 LAB — TROPONIN I (HIGH SENSITIVITY)
Troponin I (High Sensitivity): 21 ng/L — ABNORMAL HIGH (ref <18)
Troponin I (High Sensitivity): 19 ng/L — ABNORMAL HIGH (ref <18)

## 2020-11-10 LAB — BRAIN NATRIURETIC PEPTIDE: B Natriuretic Peptide: 234.3 pg/mL — ABNORMAL HIGH (ref 0.0–100.0)

## 2020-11-10 MED ORDER — FUROSEMIDE 10 MG/ML IJ SOLN
40.0000 mg | Freq: Once | INTRAMUSCULAR | Status: AC
  Administered 2020-11-10: 40 mg via INTRAVENOUS
  Filled 2020-11-10: qty 4

## 2020-11-10 MED ORDER — NALOXONE HCL 4 MG/0.1ML NA LIQD: NASAL | 0 refills | Status: DC

## 2020-11-10 NOTE — ED Notes (Signed)
Pt continues to deny bi-pap. ED Provider Dr. Joya Gaskins notified and aware. No additional orders at this time. Will continue to monitor.

## 2020-11-10 NOTE — ED Notes (Signed)
Pt to CT at this time.

## 2020-11-10 NOTE — ED Notes (Signed)
Provider at the bedside to evaluate. 

## 2020-11-10 NOTE — ED Notes (Signed)
Per ED provider, Dr. Dina Rich, pt continues to decline bi-pap and wants to leave AMA. Per Dr. Dina Rich, she has attempted to contact the pt's Brother and Mother, it is uncertain which of these individuals is the pt's legal guardian. Per Dr. Dina Rich, she has discussed the risks of the pt leaving AMA to the pt, including the risk of cardiac arrest. Per Dr. Dina Rich, the pt continues to be adamant about leaving. With ED RN Pete Pelt as witness, the pt declines bi-pap to this nurse and states he wants to leave, despite being A&O x4 at this time. ED provider notified and aware.

## 2020-11-10 NOTE — ED Notes (Signed)
Provider at the bedside to re-evaluate.

## 2020-11-10 NOTE — ED Notes (Signed)
Pt falling asleep while talking however awakens to voice. HR brady in the low 40's, however mid 50's when pt awakens. Pt satting at 100% on 2L nasal cannula. Provider notified. EKG obtained and repeat troponin obtained at this time.

## 2020-11-10 NOTE — ED Notes (Signed)
Per ED Provider Dr. Joya Gaskins, pt "will think about it" regarding bi-pap. No further orders at this time. Will re-assess.

## 2020-11-10 NOTE — ED Triage Notes (Signed)
Per EMS, bystander called for pt laying against wall at hotel. Had pinpoint pupils, 1mg  narcan given and he was more arousable. Initial O2 was was 88% on RA 1mg  narcan given IM. Sats increased to 97% on 4L.  BP 167/92 HR 63 RR 22 CBG 142

## 2020-11-10 NOTE — ED Notes (Addendum)
Pt is adamantly refusing Bipap. Pt reports he is going to get up and leave if he does not get a coke. Pt given a coke and is now willing to go to CT and let us COVID swab him. Will attempt to get pt to agree to Bipap after CT. MD made aware.

## 2020-11-10 NOTE — ED Notes (Signed)
Per ED RN Pete Pelt, pt would not lay flat for CT. ED provider Dr, Dina Rich notified and aware.

## 2020-11-10 NOTE — Discharge Instructions (Addendum)
You have chosen to leave the emergency department [place].  I have explained to you your testing results and the need for further evaluation/admission.  Your blood is very acidotic and your CO2 level is very high.  This can cause you to stop breathing, your heart to stop beating, death.  You need to stay in the hospital for treatment.  You have verbalized understanding of these results and plan and are choosing to leave the hospital [place]. You understand the risks associated with leaving without further evaluation/treatment. If you have any worsening symptoms or choose to be treated please return immediately to emergency department.
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
## 2020-11-14 DIAGNOSIS — Z03818 Encounter for observation for suspected exposure to other biological agents ruled out: Secondary | ICD-10-CM | POA: Diagnosis not present

## 2020-11-22 ENCOUNTER — Emergency Department (HOSPITAL_COMMUNITY): Payer: Medicaid Other

## 2020-11-22 ENCOUNTER — Encounter (HOSPITAL_COMMUNITY): Payer: Self-pay | Admitting: Emergency Medicine

## 2020-11-22 ENCOUNTER — Observation Stay (HOSPITAL_COMMUNITY)
Admission: EM | Admit: 2020-11-22 | Discharge: 2020-11-22 | Discharge status: Against Medical Advice | Payer: Medicaid Other | Attending: Internal Medicine | Admitting: Internal Medicine

## 2020-11-22 ENCOUNTER — Inpatient Hospital Stay (HOSPITAL_COMMUNITY): Payer: Medicaid Other

## 2020-11-22 ENCOUNTER — Other Ambulatory Visit: Payer: Self-pay

## 2020-11-22 DIAGNOSIS — J9621 Acute and chronic respiratory failure with hypoxia: Secondary | ICD-10-CM | POA: Diagnosis not present

## 2020-11-22 DIAGNOSIS — I5030 Unspecified diastolic (congestive) heart failure: Secondary | ICD-10-CM | POA: Diagnosis not present

## 2020-11-22 DIAGNOSIS — I160 Hypertensive urgency: Secondary | ICD-10-CM | POA: Insufficient documentation

## 2020-11-22 DIAGNOSIS — Z720 Tobacco use: Secondary | ICD-10-CM | POA: Diagnosis present

## 2020-11-22 DIAGNOSIS — S0990XA Unspecified injury of head, initial encounter: Secondary | ICD-10-CM | POA: Diagnosis not present

## 2020-11-22 DIAGNOSIS — J9601 Acute respiratory failure with hypoxia: Secondary | ICD-10-CM | POA: Diagnosis present

## 2020-11-22 DIAGNOSIS — Z20822 Contact with and (suspected) exposure to covid-19: Secondary | ICD-10-CM | POA: Insufficient documentation

## 2020-11-22 DIAGNOSIS — J439 Emphysema, unspecified: Secondary | ICD-10-CM | POA: Diagnosis not present

## 2020-11-22 DIAGNOSIS — F1910 Other psychoactive substance abuse, uncomplicated: Secondary | ICD-10-CM | POA: Diagnosis present

## 2020-11-22 DIAGNOSIS — I5033 Acute on chronic diastolic (congestive) heart failure: Secondary | ICD-10-CM | POA: Diagnosis not present

## 2020-11-22 DIAGNOSIS — J449 Chronic obstructive pulmonary disease, unspecified: Secondary | ICD-10-CM | POA: Diagnosis present

## 2020-11-22 DIAGNOSIS — R059 Cough, unspecified: Secondary | ICD-10-CM | POA: Diagnosis not present

## 2020-11-22 DIAGNOSIS — I10 Essential (primary) hypertension: Secondary | ICD-10-CM | POA: Diagnosis not present

## 2020-11-22 DIAGNOSIS — R296 Repeated falls: Secondary | ICD-10-CM

## 2020-11-22 DIAGNOSIS — Z03818 Encounter for observation for suspected exposure to other biological agents ruled out: Secondary | ICD-10-CM | POA: Diagnosis not present

## 2020-11-22 DIAGNOSIS — F1410 Cocaine abuse, uncomplicated: Secondary | ICD-10-CM | POA: Insufficient documentation

## 2020-11-22 DIAGNOSIS — F17210 Nicotine dependence, cigarettes, uncomplicated: Secondary | ICD-10-CM | POA: Insufficient documentation

## 2020-11-22 DIAGNOSIS — M542 Cervicalgia: Secondary | ICD-10-CM | POA: Diagnosis not present

## 2020-11-22 DIAGNOSIS — J810 Acute pulmonary edema: Secondary | ICD-10-CM | POA: Diagnosis not present

## 2020-11-22 DIAGNOSIS — I509 Heart failure, unspecified: Secondary | ICD-10-CM

## 2020-11-22 DIAGNOSIS — R0689 Other abnormalities of breathing: Secondary | ICD-10-CM | POA: Diagnosis not present

## 2020-11-22 DIAGNOSIS — Z79899 Other long term (current) drug therapy: Secondary | ICD-10-CM | POA: Diagnosis not present

## 2020-11-22 DIAGNOSIS — G4733 Obstructive sleep apnea (adult) (pediatric): Secondary | ICD-10-CM | POA: Diagnosis present

## 2020-11-22 DIAGNOSIS — R0602 Shortness of breath: Secondary | ICD-10-CM | POA: Diagnosis not present

## 2020-11-22 DIAGNOSIS — Z91199 Patient's noncompliance with other medical treatment and regimen due to unspecified reason: Secondary | ICD-10-CM

## 2020-11-22 DIAGNOSIS — E66813 Obesity, class 3: Secondary | ICD-10-CM | POA: Diagnosis present

## 2020-11-22 DIAGNOSIS — R52 Pain, unspecified: Secondary | ICD-10-CM | POA: Diagnosis not present

## 2020-11-22 LAB — I-STAT VENOUS BLOOD GAS, ED
Acid-Base Excess: 9 mmol/L — ABNORMAL HIGH (ref 0.0–2.0)
Bicarbonate: 34.8 mmol/L — ABNORMAL HIGH (ref 20.0–28.0)
Calcium, Ion: 1.09 mmol/L — ABNORMAL LOW (ref 1.15–1.40)
HCT: 45 % (ref 39.0–52.0)
Hemoglobin: 15.3 g/dL (ref 13.0–17.0)
O2 Saturation: 99 %
Potassium: 3.7 mmol/L (ref 3.5–5.1)
Sodium: 140 mmol/L (ref 135–145)
TCO2: 36 mmol/L — ABNORMAL HIGH (ref 22–32)
pCO2, Ven: 52.5 mmHg (ref 44.0–60.0)
pH, Ven: 7.429 (ref 7.250–7.430)
pO2, Ven: 126 mmHg — ABNORMAL HIGH (ref 32.0–45.0)

## 2020-11-22 LAB — CBG MONITORING, ED
Glucose-Capillary: 377 mg/dL — ABNORMAL HIGH (ref 70–99)
Glucose-Capillary: 385 mg/dL — ABNORMAL HIGH (ref 70–99)

## 2020-11-22 LAB — RESP PANEL BY RT-PCR (FLU A&B, COVID) ARPGX2
Influenza A by PCR: NEGATIVE
Influenza B by PCR: NEGATIVE
SARS Coronavirus 2 by RT PCR: NEGATIVE

## 2020-11-22 LAB — BASIC METABOLIC PANEL
Anion gap: 7 (ref 5–15)
BUN: 12 mg/dL (ref 6–20)
CO2: 34 mmol/L — ABNORMAL HIGH (ref 22–32)
Calcium: 9.2 mg/dL (ref 8.9–10.3)
Chloride: 99 mmol/L (ref 98–111)
Creatinine, Ser: 0.9 mg/dL (ref 0.61–1.24)
GFR, Estimated: >60 mL/min (ref >60)
Glucose, Bld: 134 mg/dL — ABNORMAL HIGH (ref 70–99)
Potassium: 3.7 mmol/L (ref 3.5–5.1)
Sodium: 140 mmol/L (ref 135–145)

## 2020-11-22 LAB — CBC
HCT: 44.1 % (ref 39.0–52.0)
Hemoglobin: 14.5 g/dL (ref 13.0–17.0)
MCH: 36 pg — ABNORMAL HIGH (ref 26.0–34.0)
MCHC: 32.9 g/dL (ref 30.0–36.0)
MCV: 109.4 fL — ABNORMAL HIGH (ref 80.0–100.0)
Platelets: 188 10*3/uL (ref 150–400)
RBC: 4.03 MIL/uL — ABNORMAL LOW (ref 4.22–5.81)
RDW: 14.1 % (ref 11.5–15.5)
WBC: 8.2 10*3/uL (ref 4.0–10.5)
nRBC: 0 % (ref 0.0–0.2)

## 2020-11-22 LAB — RAPID URINE DRUG SCREEN, HOSP PERFORMED
Amphetamines: NOT DETECTED
Barbiturates: NOT DETECTED
Benzodiazepines: NOT DETECTED
Cocaine: POSITIVE — AB
Opiates: NOT DETECTED
Tetrahydrocannabinol: NOT DETECTED

## 2020-11-22 LAB — GLUCOSE, CAPILLARY: Glucose-Capillary: 228 mg/dL — ABNORMAL HIGH (ref 70–99)

## 2020-11-22 LAB — BRAIN NATRIURETIC PEPTIDE: B Natriuretic Peptide: 756.1 pg/mL — ABNORMAL HIGH (ref 0.0–100.0)

## 2020-11-22 LAB — TROPONIN I (HIGH SENSITIVITY)
Troponin I (High Sensitivity): 38 ng/L — ABNORMAL HIGH (ref <18)
Troponin I (High Sensitivity): 32 ng/L — ABNORMAL HIGH (ref <18)

## 2020-11-22 LAB — VITAMIN B12: Vitamin B-12: 182 pg/mL (ref 180–914)

## 2020-11-22 LAB — ETHANOL: Alcohol, Ethyl (B): <10 mg/dL (ref <10)

## 2020-11-22 MED ORDER — SODIUM CHLORIDE 0.9% FLUSH
3.0000 mL | Freq: Two times a day (BID) | INTRAVENOUS | Status: DC
  Administered 2020-11-22 (×2): 3 mL via INTRAVENOUS

## 2020-11-22 MED ORDER — INSULIN ASPART 100 UNIT/ML IJ SOLN
0.0000 [IU] | Freq: Every day | INTRAMUSCULAR | Status: DC
  Administered 2020-11-22: 2 [IU] via SUBCUTANEOUS

## 2020-11-22 MED ORDER — SODIUM CHLORIDE 0.9 % IV SOLN: 250.0000 mL | INTRAVENOUS | Status: DC | PRN

## 2020-11-22 MED ORDER — NITROGLYCERIN 2 % TD OINT
1.0000 [in_us] | TOPICAL_OINTMENT | Freq: Once | TRANSDERMAL | Status: AC
  Administered 2020-11-22: 1 [in_us] via TOPICAL
  Filled 2020-11-22: qty 1

## 2020-11-22 MED ORDER — HYDRALAZINE HCL 20 MG/ML IJ SOLN: 10.0000 mg | INTRAMUSCULAR | Status: DC | PRN

## 2020-11-22 MED ORDER — IPRATROPIUM BROMIDE 0.02 % IN SOLN
2.0000 mg | Freq: Once | RESPIRATORY_TRACT | Status: AC
  Administered 2020-11-22: 2 mg via RESPIRATORY_TRACT
  Filled 2020-11-22: qty 10

## 2020-11-22 MED ORDER — LORAZEPAM 2 MG/ML IJ SOLN: 1.0000 mg | INTRAMUSCULAR | Status: DC | PRN

## 2020-11-22 MED ORDER — SODIUM CHLORIDE 0.9% FLUSH: 3.0000 mL | INTRAVENOUS | Status: DC | PRN

## 2020-11-22 MED ORDER — ENOXAPARIN SODIUM 40 MG/0.4ML IJ SOSY
40.0000 mg | PREFILLED_SYRINGE | INTRAMUSCULAR | Status: DC
  Administered 2020-11-22: 40 mg via SUBCUTANEOUS
  Filled 2020-11-22: qty 0.4

## 2020-11-22 MED ORDER — INSULIN ASPART 100 UNIT/ML IJ SOLN
0.0000 [IU] | Freq: Three times a day (TID) | INTRAMUSCULAR | Status: DC
Start: 2020-11-22 — End: 2020-11-23
  Administered 2020-11-22 (×2): 9 [IU] via SUBCUTANEOUS

## 2020-11-22 MED ORDER — ACETAMINOPHEN 325 MG PO TABS: 650.0000 mg | ORAL_TABLET | ORAL | Status: DC | PRN

## 2020-11-22 MED ORDER — ADULT MULTIVITAMIN W/MINERALS CH
1.0000 | ORAL_TABLET | Freq: Every day | ORAL | Status: DC
  Administered 2020-11-22: 1 via ORAL
  Filled 2020-11-22: qty 1

## 2020-11-22 MED ORDER — GUAIFENESIN ER 600 MG PO TB12
600.0000 mg | ORAL_TABLET | Freq: Two times a day (BID) | ORAL | Status: DC
  Administered 2020-11-22: 600 mg via ORAL
  Filled 2020-11-22: qty 1

## 2020-11-22 MED ORDER — FOLIC ACID 1 MG PO TABS
1.0000 mg | ORAL_TABLET | Freq: Every day | ORAL | Status: DC
  Administered 2020-11-22: 1 mg via ORAL
  Filled 2020-11-22: qty 1

## 2020-11-22 MED ORDER — ARFORMOTEROL TARTRATE 15 MCG/2ML IN NEBU: 15.0000 ug | INHALATION_SOLUTION | Freq: Two times a day (BID) | RESPIRATORY_TRACT | Status: DC

## 2020-11-22 MED ORDER — LIDOCAINE 5 % EX PTCH
1.0000 | MEDICATED_PATCH | CUTANEOUS | Status: DC
  Administered 2020-11-22: 1 via TRANSDERMAL
  Filled 2020-11-22: qty 1

## 2020-11-22 MED ORDER — ALBUTEROL (5 MG/ML) CONTINUOUS INHALATION SOLN
10.0000 mg/h | INHALATION_SOLUTION | Freq: Once | RESPIRATORY_TRACT | Status: AC
  Administered 2020-11-22: 10 mg/h via RESPIRATORY_TRACT
  Filled 2020-11-22: qty 20

## 2020-11-22 MED ORDER — NICOTINE 7 MG/24HR TD PT24
7.0000 mg | MEDICATED_PATCH | TRANSDERMAL | Status: DC
  Administered 2020-11-22: 7 mg via TRANSDERMAL
  Filled 2020-11-22: qty 1

## 2020-11-22 MED ORDER — THIAMINE HCL 100 MG PO TABS
100.0000 mg | ORAL_TABLET | Freq: Every day | ORAL | Status: DC
  Administered 2020-11-22: 100 mg via ORAL
  Filled 2020-11-22: qty 1

## 2020-11-22 MED ORDER — IPRATROPIUM-ALBUTEROL 0.5-2.5 (3) MG/3ML IN SOLN: 3.0000 mL | RESPIRATORY_TRACT | Status: DC | PRN

## 2020-11-22 MED ORDER — FUROSEMIDE 10 MG/ML IJ SOLN
40.0000 mg | Freq: Two times a day (BID) | INTRAMUSCULAR | Status: DC
  Administered 2020-11-22 (×2): 40 mg via INTRAVENOUS
  Filled 2020-11-22 (×2): qty 4

## 2020-11-22 MED ORDER — FUROSEMIDE 10 MG/ML IJ SOLN
40.0000 mg | Freq: Once | INTRAMUSCULAR | Status: AC
  Administered 2020-11-22: 40 mg via INTRAVENOUS
  Filled 2020-11-22: qty 4

## 2020-11-22 MED ORDER — METHYLPREDNISOLONE SODIUM SUCC 125 MG IJ SOLR
125.0000 mg | INTRAMUSCULAR | Status: AC
  Administered 2020-11-22: 125 mg via INTRAVENOUS
  Filled 2020-11-22: qty 2

## 2020-11-22 MED ORDER — INSULIN GLARGINE 100 UNIT/ML ~~LOC~~ SOLN
10.0000 [IU] | Freq: Every day | SUBCUTANEOUS | Status: DC
  Administered 2020-11-22: 10 [IU] via SUBCUTANEOUS
  Filled 2020-11-22 (×2): qty 0.1

## 2020-11-22 MED ORDER — IPRATROPIUM-ALBUTEROL 0.5-2.5 (3) MG/3ML IN SOLN
3.0000 mL | Freq: Four times a day (QID) | RESPIRATORY_TRACT | Status: DC
  Administered 2020-11-22: 3 mL via RESPIRATORY_TRACT

## 2020-11-22 MED ORDER — THIAMINE HCL 100 MG/ML IJ SOLN: 100.0000 mg | Freq: Every day | INTRAMUSCULAR | Status: DC

## 2020-11-22 MED ORDER — LORAZEPAM 1 MG PO TABS: 1.0000 mg | ORAL_TABLET | ORAL | Status: DC | PRN

## 2020-11-22 MED ORDER — BUDESONIDE 0.5 MG/2ML IN SUSP: 0.5000 mg | Freq: Two times a day (BID) | RESPIRATORY_TRACT | Status: DC

## 2020-11-22 MED ORDER — ALBUTEROL SULFATE HFA 108 (90 BASE) MCG/ACT IN AERS
4.0000 | INHALATION_SPRAY | Freq: Once | RESPIRATORY_TRACT | Status: AC
  Administered 2020-11-22: 4 via RESPIRATORY_TRACT
  Filled 2020-11-22: qty 6.7

## 2020-11-22 MED ORDER — LORAZEPAM 2 MG/ML IJ SOLN
0.0000 mg | Freq: Two times a day (BID) | INTRAMUSCULAR | Status: DC
Start: 2020-11-24 — End: 2020-11-23

## 2020-11-22 MED ORDER — LISINOPRIL 40 MG PO TABS: 40.0000 mg | ORAL_TABLET | Freq: Every day | ORAL | Status: DC

## 2020-11-22 MED ORDER — LORAZEPAM 2 MG/ML IJ SOLN
0.0000 mg | Freq: Four times a day (QID) | INTRAMUSCULAR | Status: DC
Start: 2020-11-22 — End: 2020-11-23
  Administered 2020-11-22: 2 mg via INTRAVENOUS
  Administered 2020-11-22: 1 mg via INTRAVENOUS
  Filled 2020-11-22 (×2): qty 1

## 2020-11-22 MED ORDER — ASPIRIN 81 MG PO CHEW
324.0000 mg | CHEWABLE_TABLET | Freq: Once | ORAL | Status: DC
  Filled 2020-11-22: qty 4

## 2020-11-22 MED ORDER — ONDANSETRON HCL 4 MG/2ML IJ SOLN: 4.0000 mg | Freq: Four times a day (QID) | INTRAMUSCULAR | Status: DC | PRN

## 2020-11-22 NOTE — ED Notes (Signed)
Patient kneeling on stretcher using urinal and accidentally displaced tubing to bipap machine. RN and ED techs readjusted patient in bed, back on cardiac monitor and back on bipap at this time. Patient requesting to have oral fluids stating his breathing is better at this time. Will inform primary RN.

## 2020-11-22 NOTE — Progress Notes (Signed)
Pt signed LAMA, tried to explain the the consequences of leaving the hospital without treatment but pt suddenly said he do not want to hear it and asked med to leave the room

## 2020-11-22 NOTE — Progress Notes (Signed)
Pt asked about his dinner, explained that cafeteria was already closed, and he already took all the food I gave to him,  that includes 3 ice cream, 3 sodas, lots of saltine and graham crackers but still pt claimed he was hungry, tried to explain that his bld sugar was already high but did not accept the explanation he was very rude the way he answered back to [place]. NT Angel offered [place] sandwich but pt refused instead asked [place] if we cannot give him his dinner, NT Glenard Haring said it was unacceptable and cannot grant the wishes, pt responded in angry manner and shouted [place] to go out and asked for paper for him to sign and he will go home now, reported the incidence to [place] and said let the pt to sign the LAMA

## 2020-11-22 NOTE — Progress Notes (Signed)
Informed Dr Nevada Crane on secured message about the incidence happened right after the pt came here in the unit, and advised to let the pt sign the LAMA form since that is pt right

## 2020-11-22 NOTE — ED Triage Notes (Addendum)
Patient arrived with EMS from a local motel reports worsening SOB with chest congestion and persistent productive cough this evening , patient added that he fell x3 this evening , denies injury , no fever or chills . History of CHF and COPD .

## 2020-11-22 NOTE — ED Notes (Signed)
Pt having repeated coughing spells, bradying down to 30s each time, intermittently falling asleep and bradying as well. Zoll pads placed on pt at this time. Pt admits to increased difficulty breathing recently with significant increase today in SOB and cough. Pt admits to ETOH and cocaine use tonight.
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
## 2020-11-22 NOTE — Progress Notes (Signed)
IV cannula removed and cardiac monitor was removed by NT Glenard Haring, but pt requested to stay for awhile since he asked somebody to pick him up here, CN Jequetta said he can stay for a while but need to leave the hospital right away

## 2020-11-22 NOTE — Progress Notes (Signed)
RT NOTES: Removed patient from bipap and placed on 4lpm nasal cannula at this time. Sats 94%. Pt in no distress. Will continue to monitor.

## 2020-11-22 NOTE — Progress Notes (Signed)
I reported this incidence to CN Jequetta and advised to have buddy system when going inside the pt room and also reported that the pt has lighter on his bucket and lot of stuff that not aware of it, and pt refused to removed all his stuff out of his bed, CN jequetta called up the security
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
## 2020-11-22 NOTE — Progress Notes (Addendum)
Two security came, respectfully greeted the pt and tried to explain to put all his stuff inside the cabinet and need to be polite and cooperative with the staff but the pt said next time 5 more security need to come up and he can do whatever he can to all of them, security said there is no need to be on that point then all of the sudden pt said he was just joking and the security said it should not be tell it to them, then they left

## 2020-11-22 NOTE — Progress Notes (Signed)
Pt left the unit, with all his stuff that include 1 big hospital plastic bag and 1 bucket and hand over his knife when he was going inside the elevator

## 2020-11-22 NOTE — Progress Notes (Signed)
Received pt from ER via stretcher with ER nurse with O2 via [place], not in distress, alert, oriented with IV cannula at right forearm, pt manage to go inside the room with minimal assist, activities well tolerated, connected pt to cardiac monitor and O2 via [place] 4 lpm, informed telemetry personnel, skin integrity checked with RN Tim Lair, pt voided thru urinal, oriented pt to room and used of bed alarm, pt asked for his personal belonging, pt checked it and become irritable agitated and abusive on his word since he said his cookies, knife, butter is missing and maybe left in ER, called up ER nurse and spoke with RN [place] and asked her to explain everything to pt, pt become more rude on the phone while talking with the nurse  As RN [place] explained he already ate his cookies  and small amount fell down when he transferred to his room  But pt still did not accept the apology and still kept talking on rude way, tried to explain that this kind of behavior is unacceptable but he asked me to go out

## 2020-11-22 NOTE — Plan of Care (Signed)

## 2020-11-23 ENCOUNTER — Telehealth: Payer: Self-pay

## 2020-11-23 NOTE — Discharge Summary (Signed)
Physician Discharge Summary  [name] KYH:[id number] DOB: April 30, 1966 DOA: 11/22/2020  PCP: [name], NP  Admit date: 11/22/2020 Discharge date: 11/22/2020  Recommendations for Outpatient Follow-up:    Discharge Diagnoses:  Left AGAINST MEDICAL ADVICE Acute on chronic respiratory failure with hypoxia secondary to diastolic congestive heart failure Hypertensive urgency Elevated troponin Cocaine abuse Frequent falls Neck pain Tobacco abuse Alcohol abuse OSA  Discharge Condition: Fair Disposition: Left AGAINST MEDICAL ADVICE  Diet recommendation: none given   Filed Weights   11/22/20 0230 11/22/20 1945  Weight: 115 kg 100.2 kg    History of present illness:  [name] is a 55 y.o. male with medical history significant of HTN, COPD, diastolic CHF, h/o vertebral osteomyelitis, tobacco abuse, and polysubstance abuse (cocaine and alcohol abuse) who presents with complaints of progressively worsening shortness of breath over the last week.  Complains of chest congestion with productive cough.  He is supposed to be on Bipap at night, but reports that he has not been using it at night due to losing tubing.  Associated symptoms include orthopnea, abdominal distention, headache, and severe neck pain after reporting that he had fallen at least 3 times prior to coming to the hospital.  Patient reports that he ran out of all of his medications and does not have the money to get them with the money that he gets each month.  He admits that he has been drinking alcohol and recently snorted cocaine.  Patient has tried to cut back from smoking cigarettes and is down to only 5 to 6 cigarettes/day on average.  Denies any complaints of chest pain or any previous history of IV drug use.   Hospital course: Upon admission to the emergency department patient was noted to be afebrile, pulse 40-60s, blood pressure 119/92-190 5/122, O2 saturations as low as 84% with improvement on after being  placed on BiPAP.  Labs significant for CO2 34, BNP 756.1, and HS troponin 38->32.  Venous blood gas revealed pH 7.429, PCO2 52.5, and PO2 126.  UDS was positive for cocaine.  Influenza and COVID-19 screening was negative.  Patient has been given DuoNeb breathing treatment, Solu-Medrol 120 mg IV, furosemide 40 mg IV, and full dose aspirin(had been canceled due to patient being on BiPAP).  Patient removed BiPAP after being given Lasix after needing to use the restroom.  Once patient was able to be transferred to the floor he was on 4 L of nasal cannula oxygen at that time.  After getting to the floor patient patient was asking for food despite receiving several times while down in the ED.  However, the kitchen was closed at that time.  Patient was noted to have a lighter and knife present in his belongings.  Security had to be called to get patient to store his belongings.  Patient was documented to be rude.  Nursing staff attempted to give him drug examination of the patient requested [employer] instead and was yelling.  Ultimately the patient requested to sign out [place] and did so around 10:30 pm  Discharge Instructions None able to be given as patient left AGAINST MEDICAL ADVICE  Allergies as of 11/22/2020       Reactions   Penicillins    Has tolerated cefepime 5/22 From childhood: Has patient had a PCN reaction causing immediate rash, facial/tongue/throat swelling, SOB or lightheadedness with hypotension: Unknown Has patient had a PCN reaction causing severe rash involving mucus membranes or skin necrosis: Unknown Has patient had a PCN  reaction that required hospitalization: Unknown Has patient had a PCN reaction occurring within the last 10 years: No If all of the above answers are "NO", then may proceed with Cephalosporin use.        Medication List     ASK your doctor about these medications    albuterol 108 (90 Base) MCG/ACT inhaler Commonly known as: VENTOLIN  HFA Inhale 2 puffs into the lungs every 4 (four) hours as needed for wheezing or shortness of breath (cough).   budesonide-formoterol 160-4.5 MCG/ACT inhaler Commonly known as: Symbicort Inhale 2 puffs into the lungs in the morning and at bedtime.   folic acid 1 MG tablet Commonly known as: FOLVITE Take 1 tablet (1 mg total) by mouth daily.   furosemide 40 MG tablet Commonly known as: LASIX Take 1 tablet (40 mg total) by mouth 2 (two) times daily.   lisinopril 40 MG tablet Commonly known as: ZESTRIL Take 1 tablet (40 mg total) by mouth daily.   metFORMIN 500 MG tablet Commonly known as: Glucophage Take 1 tablet (500 mg total) by mouth 2 (two) times daily with a meal.   naloxone 4 MG/0.1ML Liqd nasal spray kit Commonly known as: NARCAN Use as directed   nicotine 7 mg/24hr patch Commonly known as: NICODERM CQ - dosed in mg/24 hr Place 1 patch (7 mg total) onto the skin daily.       Allergies  Allergen Reactions   Penicillins     Has tolerated cefepime 5/22 From childhood: Has patient had a PCN reaction causing immediate rash, facial/tongue/throat swelling, SOB or lightheadedness with hypotension: Unknown Has patient had a PCN reaction causing severe rash involving mucus membranes or skin necrosis: Unknown Has patient had a PCN reaction that required hospitalization: Unknown Has patient had a PCN reaction occurring within the last 10 years: No If all of the above answers are "NO", then may proceed with Cephalosporin use.     The results of significant diagnostics from this hospitalization (including imaging, microbiology, ancillary and laboratory) are listed below for reference.    Significant Diagnostic Studies: DG Chest 2 View  Result Date: 11/22/2020 CLINICAL DATA:  Worsening shortness of breath with chest congestion and persistent productive cough. Also 3 falls this evening. EXAM: CHEST - 2 VIEW COMPARISON:  11/10/2020 FINDINGS: Cardiac enlargement. Mild  interstitial pattern to the lungs suggesting interstitial edema, similar to prior study. No developing consolidation or airspace disease. No pleural effusions. No pneumothorax. Mediastinal contours appear intact. Calcified and tortuous aorta. Old resection or resorption of the distal right clavicle. IMPRESSION: Cardiac enlargement with mild interstitial pattern to the lungs suggesting interstitial edema. Electronically Signed   By: Lucienne Capers M.D.   On: 11/22/2020 02:57   CT HEAD WO CONTRAST  Result Date: 11/22/2020 CLINICAL DATA:  Neck trauma, midline tenderness. Head trauma, moderate/severe. Additional provided: Recurrent falls, midline neck pain. EXAM: CT HEAD WITHOUT CONTRAST CT CERVICAL SPINE WITHOUT CONTRAST TECHNIQUE: Multidetector CT imaging of the head and cervical spine was performed following the standard protocol without intravenous contrast. Multiplanar CT image reconstructions of the cervical spine were also generated. COMPARISON:  Prior head CT examinations 10/02/2020 and earlier. CT of the cervical spine 10/02/2020. FINDINGS: CT HEAD FINDINGS Brain: Cerebral volume is unremarkable for age. There is no acute intracranial hemorrhage. No demarcated cortical infarct. No extra-axial fluid collection. No evidence of an intracranial mass. No midline shift. Vascular: No hyperdense vessel.  Atherosclerotic calcifications Skull: Normal. Negative for fracture or focal lesion. Sinuses/Orbits: Visualized orbits show no  acute finding. Small mucous retention cyst within the left frontoethmoidal recess. Trace scattered paranasal sinus mucosal thickening elsewhere. CT CERVICAL SPINE FINDINGS The examination is moderately motion degraded, limiting evaluation for acute fractures. Alignment: Reversal of the expected cervical lordosis. Trace C4-C5 and C5-C6 grade 1 retrolisthesis. 2 mm C7-T1 grade 1 anterolisthesis. Skull base and vertebrae: The basion-dental and atlanto-dental intervals are maintained. Within  the limitation of moderate motion degradation, no acute cervical spine fracture is identified. Soft tissues and spinal canal: No consolidation within the imaged lung apices. No visible pneumothorax. Disc levels: Cervical spondylosis with multilevel disc space narrowing disc bulges, posterior disc osteophytes, endplate spurring, uncovertebral hypertrophy and facet arthrosis. Disc space narrowing is greatest at the C4-C5, C5-C6 and C6-C7 levels (severe at these levels). Multilevel spinal canal stenosis. Most notably, there is suspected moderate/severe spinal canal stenosis at the C3-C4, C4-C5, C5-C6 and C6-C7 levels. Multilevel bony neural foraminal narrowing. Upper chest: No consolidation within the imaged lung apices. No visible pneumothorax IMPRESSION: CT head: 1. No evidence of acute intracranial abnormality. 2. Mild paranasal sinus disease, as described. CT cervical spine: 1. The examination is moderately motion degraded, limiting evaluation for acute fracture to the cervical spine. A repeat examination may be obtained, as clinically warranted. 2. Within the limitation of moderate motion degradation, no acute cervical spine fracture is identified. 3. Nonspecific reversal of the expected cervical lordosis. 4. Trace C4-C5 and C5-C6 grade 1 retrolisthesis. 5. 2 mm C7-T1 grade 1 anterolisthesis. 6. Cervical spondylosis, as described. Electronically Signed   By: Kellie Simmering DO   On: 11/22/2020 14:41   CT CERVICAL SPINE WO CONTRAST  Result Date: 11/22/2020 CLINICAL DATA:  Neck trauma, midline tenderness. Head trauma, moderate/severe. Additional provided: Recurrent falls, midline neck pain. EXAM: CT HEAD WITHOUT CONTRAST CT CERVICAL SPINE WITHOUT CONTRAST TECHNIQUE: Multidetector CT imaging of the head and cervical spine was performed following the standard protocol without intravenous contrast. Multiplanar CT image reconstructions of the cervical spine were also generated. COMPARISON:  Prior head CT examinations  10/02/2020 and earlier. CT of the cervical spine 10/02/2020. FINDINGS: CT HEAD FINDINGS Brain: Cerebral volume is unremarkable for age. There is no acute intracranial hemorrhage. No demarcated cortical infarct. No extra-axial fluid collection. No evidence of an intracranial mass. No midline shift. Vascular: No hyperdense vessel.  Atherosclerotic calcifications Skull: Normal. Negative for fracture or focal lesion. Sinuses/Orbits: Visualized orbits show no acute finding. Small mucous retention cyst within the left frontoethmoidal recess. Trace scattered paranasal sinus mucosal thickening elsewhere. CT CERVICAL SPINE FINDINGS The examination is moderately motion degraded, limiting evaluation for acute fractures. Alignment: Reversal of the expected cervical lordosis. Trace C4-C5 and C5-C6 grade 1 retrolisthesis. 2 mm C7-T1 grade 1 anterolisthesis. Skull base and vertebrae: The basion-dental and atlanto-dental intervals are maintained. Within the limitation of moderate motion degradation, no acute cervical spine fracture is identified. Soft tissues and spinal canal: No consolidation within the imaged lung apices. No visible pneumothorax. Disc levels: Cervical spondylosis with multilevel disc space narrowing disc bulges, posterior disc osteophytes, endplate spurring, uncovertebral hypertrophy and facet arthrosis. Disc space narrowing is greatest at the C4-C5, C5-C6 and C6-C7 levels (severe at these levels). Multilevel spinal canal stenosis. Most notably, there is suspected moderate/severe spinal canal stenosis at the C3-C4, C4-C5, C5-C6 and C6-C7 levels. Multilevel bony neural foraminal narrowing. Upper chest: No consolidation within the imaged lung apices. No visible pneumothorax IMPRESSION: CT head: 1. No evidence of acute intracranial abnormality. 2. Mild paranasal sinus disease, as described. CT cervical spine: 1. The examination  is moderately motion degraded, limiting evaluation for acute fracture to the cervical  spine. A repeat examination may be obtained, as clinically warranted. 2. Within the limitation of moderate motion degradation, no acute cervical spine fracture is identified. 3. Nonspecific reversal of the expected cervical lordosis. 4. Trace C4-C5 and C5-C6 grade 1 retrolisthesis. 5. 2 mm C7-T1 grade 1 anterolisthesis. 6. Cervical spondylosis, as described. Electronically Signed   By: Kellie Simmering DO   On: 11/22/2020 14:41   DG Chest Port 1 View  Result Date: 11/10/2020 CLINICAL DATA:  Shortness of EXAM: PORTABLE CHEST 1 VIEW COMPARISON:  Chest x-ray 10/09/2020 FINDINGS: Borderline cardiac enlargement given the AP projection and portable technique. There is stable tortuosity and calcification of the thoracic aorta. Peribronchial thickening and increased interstitial markings suspicious for interstitial pulmonary edema. No pleural effusions or focal infiltrates. IMPRESSION: CHF. Electronically Signed   By: Marijo Sanes M.D.   On: 11/10/2020 12:19    Microbiology: Recent Results (from the past 240 hour(s))  Resp Panel by RT-PCR (Flu A&B, Covid) Nasopharyngeal Swab     Status: None   Collection Time: 11/22/20  4:27 AM   Specimen: Nasopharyngeal Swab; Nasopharyngeal(NP) swabs in vial transport medium  Result Value Ref Range Status   SARS Coronavirus 2 by RT PCR NEGATIVE NEGATIVE Final    Comment: (NOTE) SARS-CoV-2 target nucleic acids are NOT DETECTED.  The SARS-CoV-2 RNA is generally detectable in upper respiratory specimens during the acute phase of infection. The lowest concentration of SARS-CoV-2 viral copies this assay can detect is 138 copies/mL. A negative result does not preclude SARS-Cov-2 infection and should not be used as the sole basis for treatment or other patient management decisions. A negative result may occur with  improper specimen collection/handling, submission of specimen other than nasopharyngeal swab, presence of viral mutation(s) within the areas targeted by this  assay, and inadequate number of viral copies(<138 copies/mL). A negative result must be combined with clinical observations, patient history, and epidemiological information. The expected result is Negative.  Fact Sheet for Patients:  EntrepreneurPulse.com.au  Fact Sheet for Healthcare Providers:  IncredibleEmployment.be  This test is no t yet approved or cleared by the [place] FDA and  has been authorized for detection and/or diagnosis of SARS-CoV-2 by FDA under an Emergency Use Authorization (EUA). This EUA will remain  in effect (meaning this test can be used) for the duration of the COVID-19 declaration under Section 564(b)(1) of the Act, 21 U.S.C.section 360bbb-3(b)(1), unless the authorization is terminated  or revoked sooner.       Influenza A by PCR NEGATIVE NEGATIVE Final   Influenza B by PCR NEGATIVE NEGATIVE Final    Comment: (NOTE) The Xpert Xpress SARS-CoV-2/FLU/RSV plus assay is intended as an aid in the diagnosis of influenza from Nasopharyngeal swab specimens and should not be used as a sole basis for treatment. Nasal washings and aspirates are unacceptable for Xpert Xpress SARS-CoV-2/FLU/RSV testing.  Fact Sheet for Patients: EntrepreneurPulse.com.au  Fact Sheet for Healthcare Providers: IncredibleEmployment.be  This test is not yet approved or cleared by the [place] FDA and has been authorized for detection and/or diagnosis of SARS-CoV-2 by FDA under an Emergency Use Authorization (EUA). This EUA will remain in effect (meaning this test can be used) for the duration of the COVID-19 declaration under Section 564(b)(1) of the Act, 21 U.S.C. section 360bbb-3(b)(1), unless the authorization is terminated or revoked.  Performed at [facility], [place] [address] [place], [place] [address]  Labs: Basic Metabolic Panel: Recent Labs  Lab 11/22/20 0231 11/22/20 0558   NA 140 140  K 3.7 3.7  CL 99  --   CO2 34*  --   GLUCOSE 134*  --   BUN 12  --   CREATININE 0.90  --   CALCIUM 9.2  --    Liver Function Tests: No results for input(s): AST, ALT, ALKPHOS, BILITOT, PROT, ALBUMIN in the last 168 hours. No results for input(s): LIPASE, AMYLASE in the last 168 hours. No results for input(s): AMMONIA in the last 168 hours. CBC: Recent Labs  Lab 11/22/20 0231 11/22/20 0558  WBC 8.2  --   HGB 14.5 15.3  HCT 44.1 45.0  MCV 109.4*  --   PLT 188  --    Cardiac Enzymes: No results for input(s): CKTOTAL, CKMB, CKMBINDEX, TROPONINI in the last 168 hours. BNP: BNP (last 3 results) Recent Labs    10/09/20 0436 11/10/20 1058 11/22/20 0508  BNP 121.8* 234.3* 756.1*    ProBNP (last 3 results) No results for input(s): PROBNP in the last 8760 hours.  CBG: Recent Labs  Lab 11/22/20 1310 11/22/20 1649 11/22/20 2117  GLUCAP 377* 385* 228*    Principal Problem:   Acute respiratory failure with hypoxia (HCC) Active Problems:   COPD (chronic obstructive pulmonary disease) (HCC)   Tobacco abuse   Polysubstance abuse (HCC)   Acute on chronic diastolic CHF (congestive heart failure) (HCC)   Acute pulmonary edema (HCC)   Obesity, Class III, BMI 40-49.9 (morbid obesity) ([place])   Obstructive sleep apnea   Frequent falls   Time coordinating discharge: 0  Signed:  Murray Hodgkins, MD [facility] 11/23/2020, 7:22 PM

## 2020-11-23 NOTE — Telephone Encounter (Signed)
Transition Care Management Unsuccessful Follow-up Telephone Call  Date of discharge and from where:  11/22/2020, [facility].  Left AMA  Attempts:  1st Attempt  Reason for unsuccessful TCM follow-up call:  Unable to reach patient - call placed to # [phone], his mother answered and said he is in the hospital.  She would give him a message that I called.  Call placed to # [phone] and is just rings fast busy.

## 2020-11-24 ENCOUNTER — Emergency Department (HOSPITAL_COMMUNITY): Payer: Medicaid Other

## 2020-11-24 ENCOUNTER — Other Ambulatory Visit: Payer: Self-pay

## 2020-11-24 ENCOUNTER — Telehealth: Payer: Self-pay

## 2020-11-24 ENCOUNTER — Inpatient Hospital Stay (HOSPITAL_COMMUNITY)
Admission: EM | Admit: 2020-11-24 | Discharge: 2020-11-29 | DRG: 190 | Discharge status: Against Medical Advice | Payer: Medicaid Other | Attending: Pulmonary Disease | Admitting: Pulmonary Disease

## 2020-11-24 ENCOUNTER — Inpatient Hospital Stay (HOSPITAL_COMMUNITY): Payer: Medicaid Other

## 2020-11-24 ENCOUNTER — Encounter (HOSPITAL_COMMUNITY): Payer: Self-pay | Admitting: Internal Medicine

## 2020-11-24 DIAGNOSIS — E669 Obesity, unspecified: Secondary | ICD-10-CM | POA: Diagnosis present

## 2020-11-24 DIAGNOSIS — E1165 Type 2 diabetes mellitus with hyperglycemia: Secondary | ICD-10-CM | POA: Diagnosis present

## 2020-11-24 DIAGNOSIS — J9622 Acute and chronic respiratory failure with hypercapnia: Secondary | ICD-10-CM | POA: Diagnosis present

## 2020-11-24 DIAGNOSIS — F17210 Nicotine dependence, cigarettes, uncomplicated: Secondary | ICD-10-CM | POA: Diagnosis present

## 2020-11-24 DIAGNOSIS — Z03818 Encounter for observation for suspected exposure to other biological agents ruled out: Secondary | ICD-10-CM | POA: Diagnosis not present

## 2020-11-24 DIAGNOSIS — J441 Chronic obstructive pulmonary disease with (acute) exacerbation: Secondary | ICD-10-CM | POA: Diagnosis present

## 2020-11-24 DIAGNOSIS — Z880 Allergy status to penicillin: Secondary | ICD-10-CM

## 2020-11-24 DIAGNOSIS — F1910 Other psychoactive substance abuse, uncomplicated: Secondary | ICD-10-CM | POA: Diagnosis not present

## 2020-11-24 DIAGNOSIS — I5032 Chronic diastolic (congestive) heart failure: Secondary | ICD-10-CM | POA: Diagnosis not present

## 2020-11-24 DIAGNOSIS — Z8616 Personal history of COVID-19: Secondary | ICD-10-CM

## 2020-11-24 DIAGNOSIS — G4733 Obstructive sleep apnea (adult) (pediatric): Secondary | ICD-10-CM | POA: Diagnosis present

## 2020-11-24 DIAGNOSIS — Z79899 Other long term (current) drug therapy: Secondary | ICD-10-CM

## 2020-11-24 DIAGNOSIS — G928 Other toxic encephalopathy: Secondary | ICD-10-CM | POA: Diagnosis present

## 2020-11-24 DIAGNOSIS — Z7984 Long term (current) use of oral hypoglycemic drugs: Secondary | ICD-10-CM

## 2020-11-24 DIAGNOSIS — J439 Emphysema, unspecified: Secondary | ICD-10-CM | POA: Diagnosis not present

## 2020-11-24 DIAGNOSIS — R55 Syncope and collapse: Secondary | ICD-10-CM | POA: Diagnosis present

## 2020-11-24 DIAGNOSIS — Z720 Tobacco use: Secondary | ICD-10-CM | POA: Diagnosis present

## 2020-11-24 DIAGNOSIS — G9340 Encephalopathy, unspecified: Secondary | ICD-10-CM | POA: Diagnosis present

## 2020-11-24 DIAGNOSIS — Z5901 Sheltered homelessness: Secondary | ICD-10-CM

## 2020-11-24 DIAGNOSIS — R0902 Hypoxemia: Secondary | ICD-10-CM | POA: Diagnosis not present

## 2020-11-24 DIAGNOSIS — I517 Cardiomegaly: Secondary | ICD-10-CM | POA: Diagnosis not present

## 2020-11-24 DIAGNOSIS — I10 Essential (primary) hypertension: Secondary | ICD-10-CM | POA: Diagnosis not present

## 2020-11-24 DIAGNOSIS — J9621 Acute and chronic respiratory failure with hypoxia: Secondary | ICD-10-CM | POA: Diagnosis not present

## 2020-11-24 DIAGNOSIS — F10231 Alcohol dependence with withdrawal delirium: Secondary | ICD-10-CM | POA: Diagnosis present

## 2020-11-24 DIAGNOSIS — F1410 Cocaine abuse, uncomplicated: Secondary | ICD-10-CM | POA: Diagnosis present

## 2020-11-24 DIAGNOSIS — Z716 Tobacco abuse counseling: Secondary | ICD-10-CM

## 2020-11-24 DIAGNOSIS — G8929 Other chronic pain: Secondary | ICD-10-CM | POA: Diagnosis present

## 2020-11-24 DIAGNOSIS — E872 Acidosis: Secondary | ICD-10-CM | POA: Diagnosis present

## 2020-11-24 DIAGNOSIS — R001 Bradycardia, unspecified: Secondary | ICD-10-CM | POA: Diagnosis present

## 2020-11-24 DIAGNOSIS — Z6835 Body mass index (BMI) 35.0-35.9, adult: Secondary | ICD-10-CM | POA: Diagnosis not present

## 2020-11-24 DIAGNOSIS — I110 Hypertensive heart disease with heart failure: Secondary | ICD-10-CM | POA: Diagnosis present

## 2020-11-24 DIAGNOSIS — N179 Acute kidney failure, unspecified: Secondary | ICD-10-CM | POA: Diagnosis present

## 2020-11-24 DIAGNOSIS — Z20822 Contact with and (suspected) exposure to covid-19: Secondary | ICD-10-CM | POA: Diagnosis present

## 2020-11-24 DIAGNOSIS — Z713 Dietary counseling and surveillance: Secondary | ICD-10-CM

## 2020-11-24 DIAGNOSIS — Z8249 Family history of ischemic heart disease and other diseases of the circulatory system: Secondary | ICD-10-CM

## 2020-11-24 DIAGNOSIS — Z7951 Long term (current) use of inhaled steroids: Secondary | ICD-10-CM

## 2020-11-24 DIAGNOSIS — Z9114 Patient's other noncompliance with medication regimen: Secondary | ICD-10-CM

## 2020-11-24 DIAGNOSIS — R0602 Shortness of breath: Secondary | ICD-10-CM | POA: Diagnosis not present

## 2020-11-24 DIAGNOSIS — IMO0002 Reserved for concepts with insufficient information to code with codable children: Secondary | ICD-10-CM

## 2020-11-24 DIAGNOSIS — R0689 Other abnormalities of breathing: Secondary | ICD-10-CM | POA: Diagnosis not present

## 2020-11-24 DIAGNOSIS — R296 Repeated falls: Secondary | ICD-10-CM | POA: Diagnosis present

## 2020-11-24 DIAGNOSIS — S0990XA Unspecified injury of head, initial encounter: Secondary | ICD-10-CM | POA: Diagnosis not present

## 2020-11-24 DIAGNOSIS — E6609 Other obesity due to excess calories: Secondary | ICD-10-CM

## 2020-11-24 DIAGNOSIS — E119 Type 2 diabetes mellitus without complications: Secondary | ICD-10-CM | POA: Diagnosis present

## 2020-11-24 DIAGNOSIS — J449 Chronic obstructive pulmonary disease, unspecified: Secondary | ICD-10-CM | POA: Diagnosis present

## 2020-11-24 DIAGNOSIS — T380X5A Adverse effect of glucocorticoids and synthetic analogues, initial encounter: Secondary | ICD-10-CM | POA: Diagnosis not present

## 2020-11-24 DIAGNOSIS — Z9981 Dependence on supplemental oxygen: Secondary | ICD-10-CM

## 2020-11-24 DIAGNOSIS — E876 Hypokalemia: Secondary | ICD-10-CM | POA: Diagnosis present

## 2020-11-24 HISTORY — DX: Type 2 diabetes mellitus with hyperglycemia: E11.65

## 2020-11-24 HISTORY — DX: Reserved for concepts with insufficient information to code with codable children: IMO0002

## 2020-11-24 LAB — COMPREHENSIVE METABOLIC PANEL
ALT: 45 U/L — ABNORMAL HIGH (ref 0–44)
AST: 52 U/L — ABNORMAL HIGH (ref 15–41)
Albumin: 3.6 g/dL (ref 3.5–5.0)
Alkaline Phosphatase: 54 U/L (ref 38–126)
Anion gap: 12 (ref 5–15)
BUN: 22 mg/dL — ABNORMAL HIGH (ref 6–20)
CO2: 31 mmol/L (ref 22–32)
Calcium: 8.5 mg/dL — ABNORMAL LOW (ref 8.9–10.3)
Chloride: 91 mmol/L — ABNORMAL LOW (ref 98–111)
Creatinine, Ser: 1.29 mg/dL — ABNORMAL HIGH (ref 0.61–1.24)
GFR, Estimated: >60 mL/min (ref >60)
Glucose, Bld: 263 mg/dL — ABNORMAL HIGH (ref 70–99)
Potassium: 3.1 mmol/L — ABNORMAL LOW (ref 3.5–5.1)
Sodium: 134 mmol/L — ABNORMAL LOW (ref 135–145)
Total Bilirubin: 0.9 mg/dL (ref 0.3–1.2)
Total Protein: 6.8 g/dL (ref 6.5–8.1)

## 2020-11-24 LAB — I-STAT ARTERIAL BLOOD GAS, ED
Acid-Base Excess: 10 mmol/L — ABNORMAL HIGH (ref 0.0–2.0)
Acid-Base Excess: 10 mmol/L — ABNORMAL HIGH (ref 0.0–2.0)
Bicarbonate: 40.4 mmol/L — ABNORMAL HIGH (ref 20.0–28.0)
Bicarbonate: 41.7 mmol/L — ABNORMAL HIGH (ref 20.0–28.0)
Calcium, Ion: 1.17 mmol/L (ref 1.15–1.40)
Calcium, Ion: 1.18 mmol/L (ref 1.15–1.40)
HCT: 45 % (ref 39.0–52.0)
HCT: 47 % (ref 39.0–52.0)
Hemoglobin: 15.3 g/dL (ref 13.0–17.0)
Hemoglobin: 16 g/dL (ref 13.0–17.0)
O2 Saturation: 97 %
O2 Saturation: 97 %
Patient temperature: 97.6
Patient temperature: 97.6
Potassium: 2.8 mmol/L — ABNORMAL LOW (ref 3.5–5.1)
Potassium: 4.1 mmol/L (ref 3.5–5.1)
Sodium: 138 mmol/L (ref 135–145)
Sodium: 138 mmol/L (ref 135–145)
TCO2: 43 mmol/L — ABNORMAL HIGH (ref 22–32)
TCO2: 44 mmol/L — ABNORMAL HIGH (ref 22–32)
pCO2 arterial: 80.2 mmHg (ref 32.0–48.0)
pCO2 arterial: 87.3 mmHg (ref 32.0–48.0)
pH, Arterial: 7.308 — ABNORMAL LOW (ref 7.350–7.450)
pH, Arterial: 7.285 — ABNORMAL LOW (ref 7.350–7.450)
pO2, Arterial: 101 mmHg (ref 83.0–108.0)
pO2, Arterial: 108 mmHg (ref 83.0–108.0)

## 2020-11-24 LAB — CBC WITH DIFFERENTIAL/PLATELET
Abs Immature Granulocytes: 0.07 10*3/uL (ref 0.00–0.07)
Basophils Absolute: 0 10*3/uL (ref 0.0–0.1)
Basophils Relative: 0 %
Eosinophils Absolute: 0 10*3/uL (ref 0.0–0.5)
Eosinophils Relative: 0 %
HCT: 47.1 % (ref 39.0–52.0)
Hemoglobin: 15.7 g/dL (ref 13.0–17.0)
Immature Granulocytes: 1 %
Lymphocytes Relative: 10 %
Lymphs Abs: 1.1 10*3/uL (ref 0.7–4.0)
MCH: 36.2 pg — ABNORMAL HIGH (ref 26.0–34.0)
MCHC: 33.3 g/dL (ref 30.0–36.0)
MCV: 108.5 fL — ABNORMAL HIGH (ref 80.0–100.0)
Monocytes Absolute: 0.7 10*3/uL (ref 0.1–1.0)
Monocytes Relative: 6 %
Neutro Abs: 8.9 10*3/uL — ABNORMAL HIGH (ref 1.7–7.7)
Neutrophils Relative %: 83 %
Platelets: 203 10*3/uL (ref 150–400)
RBC: 4.34 MIL/uL (ref 4.22–5.81)
RDW: 14.4 % (ref 11.5–15.5)
WBC: 10.8 10*3/uL — ABNORMAL HIGH (ref 4.0–10.5)
nRBC: 0 % (ref 0.0–0.2)

## 2020-11-24 LAB — I-STAT VENOUS BLOOD GAS, ED
Acid-Base Excess: 10 mmol/L — ABNORMAL HIGH (ref 0.0–2.0)
Bicarbonate: 41.4 mmol/L — ABNORMAL HIGH (ref 20.0–28.0)
Calcium, Ion: 1.1 mmol/L — ABNORMAL LOW (ref 1.15–1.40)
HCT: 50 % (ref 39.0–52.0)
Hemoglobin: 17 g/dL (ref 13.0–17.0)
O2 Saturation: 60 %
Potassium: 4.1 mmol/L (ref 3.5–5.1)
Sodium: 138 mmol/L (ref 135–145)
TCO2: 44 mmol/L — ABNORMAL HIGH (ref 22–32)
pCO2, Ven: 84.8 mmHg (ref 44.0–60.0)
pH, Ven: 7.297 (ref 7.250–7.430)
pO2, Ven: 37 mmHg (ref 32.0–45.0)

## 2020-11-24 LAB — MAGNESIUM: Magnesium: 2.1 mg/dL (ref 1.7–2.4)

## 2020-11-24 LAB — I-STAT CHEM 8, ED
BUN: 24 mg/dL — ABNORMAL HIGH (ref 6–20)
Calcium, Ion: 1.05 mmol/L — ABNORMAL LOW (ref 1.15–1.40)
Chloride: 95 mmol/L — ABNORMAL LOW (ref 98–111)
Creatinine, Ser: 1 mg/dL (ref 0.61–1.24)
Glucose, Bld: 183 mg/dL — ABNORMAL HIGH (ref 70–99)
HCT: 41 % (ref 39.0–52.0)
Hemoglobin: 13.9 g/dL (ref 13.0–17.0)
Potassium: 2.6 mmol/L — CL (ref 3.5–5.1)
Sodium: 140 mmol/L (ref 135–145)
TCO2: 35 mmol/L — ABNORMAL HIGH (ref 22–32)

## 2020-11-24 LAB — TROPONIN I (HIGH SENSITIVITY)
Troponin I (High Sensitivity): 55 ng/L — ABNORMAL HIGH (ref <18)
Troponin I (High Sensitivity): 52 ng/L — ABNORMAL HIGH (ref <18)
Troponin I (High Sensitivity): 38 ng/L — ABNORMAL HIGH (ref <18)

## 2020-11-24 LAB — BRAIN NATRIURETIC PEPTIDE: B Natriuretic Peptide: 214.6 pg/mL — ABNORMAL HIGH (ref 0.0–100.0)

## 2020-11-24 LAB — GLUCOSE, CAPILLARY
Glucose-Capillary: 172 mg/dL — ABNORMAL HIGH (ref 70–99)
Glucose-Capillary: 200 mg/dL — ABNORMAL HIGH (ref 70–99)

## 2020-11-24 LAB — AMMONIA: Ammonia: 66 umol/L — ABNORMAL HIGH (ref 9–35)

## 2020-11-24 LAB — ETHANOL: Alcohol, Ethyl (B): <10 mg/dL (ref <10)

## 2020-11-24 LAB — HIV ANTIBODY (ROUTINE TESTING W REFLEX): HIV Screen 4th Generation wRfx: NONREACTIVE

## 2020-11-24 LAB — RESP PANEL BY RT-PCR (FLU A&B, COVID) ARPGX2
Influenza A by PCR: NEGATIVE
Influenza B by PCR: NEGATIVE
SARS Coronavirus 2 by RT PCR: NEGATIVE

## 2020-11-24 MED ORDER — SODIUM CHLORIDE 0.9% FLUSH
3.0000 mL | Freq: Two times a day (BID) | INTRAVENOUS | Status: DC
  Administered 2020-11-24 – 2020-11-28 (×8): 3 mL via INTRAVENOUS

## 2020-11-24 MED ORDER — BISACODYL 5 MG PO TBEC: 5.0000 mg | DELAYED_RELEASE_TABLET | Freq: Every day | ORAL | Status: DC | PRN

## 2020-11-24 MED ORDER — FAMOTIDINE 20 MG PO TABS
20.0000 mg | ORAL_TABLET | Freq: Every day | ORAL | Status: DC
  Administered 2020-11-24 – 2020-11-25 (×2): 20 mg via ORAL
  Filled 2020-11-24 (×2): qty 1

## 2020-11-24 MED ORDER — LOPERAMIDE HCL 2 MG PO CAPS
2.0000 mg | ORAL_CAPSULE | ORAL | Status: DC | PRN
  Filled 2020-11-24: qty 2

## 2020-11-24 MED ORDER — THIAMINE HCL 100 MG/ML IJ SOLN
100.0000 mg | Freq: Every day | INTRAMUSCULAR | Status: DC
  Administered 2020-11-27 – 2020-11-28 (×2): 100 mg via INTRAVENOUS
  Filled 2020-11-24 (×2): qty 2

## 2020-11-24 MED ORDER — INSULIN ASPART 100 UNIT/ML IJ SOLN
0.0000 [IU] | INTRAMUSCULAR | Status: DC
  Administered 2020-11-24 (×2): 2 [IU] via SUBCUTANEOUS

## 2020-11-24 MED ORDER — IPRATROPIUM-ALBUTEROL 0.5-2.5 (3) MG/3ML IN SOLN
3.0000 mL | Freq: Four times a day (QID) | RESPIRATORY_TRACT | Status: DC
  Administered 2020-11-24 – 2020-11-27 (×8): 3 mL via RESPIRATORY_TRACT
  Filled 2020-11-24 (×11): qty 3

## 2020-11-24 MED ORDER — LORAZEPAM 2 MG/ML IJ SOLN: 1.0000 mg | INTRAMUSCULAR | Status: DC | PRN

## 2020-11-24 MED ORDER — BUDESONIDE 0.5 MG/2ML IN SUSP
0.5000 mg | Freq: Two times a day (BID) | RESPIRATORY_TRACT | Status: DC
  Administered 2020-11-24 – 2020-11-28 (×7): 0.5 mg via RESPIRATORY_TRACT
  Filled 2020-11-24 (×11): qty 2

## 2020-11-24 MED ORDER — CLONIDINE HCL 0.1 MG PO TABS: 0.1000 mg | ORAL_TABLET | Freq: Every day | ORAL | Status: DC

## 2020-11-24 MED ORDER — METHOCARBAMOL 500 MG PO TABS: 500.0000 mg | ORAL_TABLET | Freq: Three times a day (TID) | ORAL | Status: DC | PRN

## 2020-11-24 MED ORDER — METHYLPREDNISOLONE SODIUM SUCC 125 MG IJ SOLR
60.0000 mg | Freq: Two times a day (BID) | INTRAMUSCULAR | Status: DC
Start: 2020-11-25 — End: 2020-11-24

## 2020-11-24 MED ORDER — LORAZEPAM 1 MG PO TABS: 1.0000 mg | ORAL_TABLET | ORAL | Status: DC | PRN

## 2020-11-24 MED ORDER — NICOTINE 7 MG/24HR TD PT24
7.0000 mg | MEDICATED_PATCH | Freq: Every day | TRANSDERMAL | Status: DC
  Administered 2020-11-27 – 2020-11-28 (×2): 7 mg via TRANSDERMAL
  Filled 2020-11-24 (×4): qty 1

## 2020-11-24 MED ORDER — CLONIDINE HCL 0.1 MG PO TABS: 0.1000 mg | ORAL_TABLET | ORAL | Status: DC

## 2020-11-24 MED ORDER — CHLORHEXIDINE GLUCONATE CLOTH 2 % EX PADS
6.0000 | MEDICATED_PAD | Freq: Every day | CUTANEOUS | Status: DC
  Administered 2020-11-24 – 2020-11-28 (×4): 6 via TOPICAL

## 2020-11-24 MED ORDER — ACETAMINOPHEN 650 MG RE SUPP: 650.0000 mg | Freq: Four times a day (QID) | RECTAL | Status: DC | PRN

## 2020-11-24 MED ORDER — FOLIC ACID 1 MG PO TABS
1.0000 mg | ORAL_TABLET | Freq: Every day | ORAL | Status: DC
  Administered 2020-11-24 – 2020-11-26 (×3): 1 mg via ORAL
  Filled 2020-11-24 (×3): qty 1

## 2020-11-24 MED ORDER — CLONIDINE HCL 0.1 MG PO TABS: 0.1000 mg | ORAL_TABLET | Freq: Four times a day (QID) | ORAL | Status: DC

## 2020-11-24 MED ORDER — HYDRALAZINE HCL 20 MG/ML IJ SOLN
5.0000 mg | INTRAMUSCULAR | Status: DC | PRN
  Administered 2020-11-25 – 2020-11-28 (×4): 5 mg via INTRAVENOUS
  Filled 2020-11-24 (×5): qty 1

## 2020-11-24 MED ORDER — METHYLPREDNISOLONE SODIUM SUCC 125 MG IJ SOLR
125.0000 mg | Freq: Once | INTRAMUSCULAR | Status: AC
  Administered 2020-11-24: 125 mg via INTRAVENOUS
  Filled 2020-11-24: qty 2

## 2020-11-24 MED ORDER — POLYETHYLENE GLYCOL 3350 17 G PO PACK: 17.0000 g | PACK | Freq: Every day | ORAL | Status: DC | PRN

## 2020-11-24 MED ORDER — PREDNISONE 20 MG PO TABS
40.0000 mg | ORAL_TABLET | Freq: Every day | ORAL | Status: DC
  Administered 2020-11-25 – 2020-11-26 (×2): 40 mg via ORAL
  Filled 2020-11-24 (×3): qty 2

## 2020-11-24 MED ORDER — THIAMINE HCL 100 MG/ML IJ SOLN: 100.0000 mg | Freq: Every day | INTRAMUSCULAR | Status: DC

## 2020-11-24 MED ORDER — INSULIN ASPART 100 UNIT/ML IJ SOLN: 0.0000 [IU] | Freq: Every day | INTRAMUSCULAR | Status: DC

## 2020-11-24 MED ORDER — NAPROXEN 250 MG PO TABS: 500.0000 mg | ORAL_TABLET | Freq: Two times a day (BID) | ORAL | Status: DC | PRN

## 2020-11-24 MED ORDER — THIAMINE HCL 100 MG PO TABS: 100.0000 mg | ORAL_TABLET | Freq: Every day | ORAL | Status: DC

## 2020-11-24 MED ORDER — ONDANSETRON HCL 4 MG PO TABS: 4.0000 mg | ORAL_TABLET | Freq: Four times a day (QID) | ORAL | Status: DC | PRN

## 2020-11-24 MED ORDER — POTASSIUM CHLORIDE 10 MEQ/100ML IV SOLN
10.0000 meq | INTRAVENOUS | Status: AC
  Administered 2020-11-24 (×2): 10 meq via INTRAVENOUS
  Filled 2020-11-24 (×2): qty 100

## 2020-11-24 MED ORDER — LACTATED RINGERS IV SOLN: INTRAVENOUS | Status: DC

## 2020-11-24 MED ORDER — FOLIC ACID 1 MG PO TABS: 1.0000 mg | ORAL_TABLET | Freq: Every day | ORAL | Status: DC

## 2020-11-24 MED ORDER — ONDANSETRON HCL 4 MG/2ML IJ SOLN: 4.0000 mg | Freq: Four times a day (QID) | INTRAMUSCULAR | Status: DC | PRN

## 2020-11-24 MED ORDER — INSULIN ASPART 100 UNIT/ML IJ SOLN: 0.0000 [IU] | Freq: Three times a day (TID) | INTRAMUSCULAR | Status: DC

## 2020-11-24 MED ORDER — HYDROXYZINE HCL 25 MG PO TABS
25.0000 mg | ORAL_TABLET | Freq: Four times a day (QID) | ORAL | Status: DC | PRN
  Administered 2020-11-26: 25 mg via ORAL
  Filled 2020-11-24: qty 1

## 2020-11-24 MED ORDER — ALBUTEROL (5 MG/ML) CONTINUOUS INHALATION SOLN
10.0000 mg/h | INHALATION_SOLUTION | RESPIRATORY_TRACT | Status: DC
  Administered 2020-11-24: 10 mg/h via RESPIRATORY_TRACT
  Filled 2020-11-24: qty 20

## 2020-11-24 MED ORDER — DICYCLOMINE HCL 20 MG PO TABS
20.0000 mg | ORAL_TABLET | Freq: Four times a day (QID) | ORAL | Status: DC | PRN
  Filled 2020-11-24: qty 1

## 2020-11-24 MED ORDER — ALBUTEROL SULFATE (2.5 MG/3ML) 0.083% IN NEBU: 2.5000 mg | INHALATION_SOLUTION | RESPIRATORY_TRACT | Status: DC | PRN

## 2020-11-24 MED ORDER — ENOXAPARIN SODIUM 40 MG/0.4ML IJ SOSY
40.0000 mg | PREFILLED_SYRINGE | INTRAMUSCULAR | Status: DC
  Administered 2020-11-24 – 2020-11-28 (×5): 40 mg via SUBCUTANEOUS
  Filled 2020-11-24 (×4): qty 0.4

## 2020-11-24 MED ORDER — ACETAMINOPHEN 325 MG PO TABS: 650.0000 mg | ORAL_TABLET | Freq: Four times a day (QID) | ORAL | Status: DC | PRN

## 2020-11-24 MED ORDER — ADULT MULTIVITAMIN W/MINERALS CH
1.0000 | ORAL_TABLET | Freq: Every day | ORAL | Status: DC
  Administered 2020-11-25 – 2020-11-26 (×2): 1 via ORAL
  Filled 2020-11-24 (×2): qty 1

## 2020-11-24 MED ORDER — DOCUSATE SODIUM 100 MG PO CAPS
100.0000 mg | ORAL_CAPSULE | Freq: Two times a day (BID) | ORAL | Status: DC
  Administered 2020-11-24 – 2020-11-26 (×4): 100 mg via ORAL
  Filled 2020-11-24 (×4): qty 1

## 2020-11-24 MED ORDER — ARFORMOTEROL TARTRATE 15 MCG/2ML IN NEBU
15.0000 ug | INHALATION_SOLUTION | Freq: Two times a day (BID) | RESPIRATORY_TRACT | Status: DC
  Administered 2020-11-24 – 2020-11-28 (×6): 15 ug via RESPIRATORY_TRACT
  Filled 2020-11-24 (×11): qty 2

## 2020-11-24 MED ORDER — GUAIFENESIN ER 600 MG PO TB12
600.0000 mg | ORAL_TABLET | Freq: Two times a day (BID) | ORAL | Status: DC | PRN
  Filled 2020-11-24: qty 1

## 2020-11-24 MED ORDER — THIAMINE HCL 100 MG PO TABS
100.0000 mg | ORAL_TABLET | Freq: Every day | ORAL | Status: DC
  Administered 2020-11-24 – 2020-11-26 (×3): 100 mg via ORAL
  Filled 2020-11-24 (×3): qty 1

## 2020-11-24 MED ORDER — PREDNISONE 20 MG PO TABS: 40.0000 mg | ORAL_TABLET | Freq: Every day | ORAL | Status: DC

## 2020-11-24 NOTE — ED Notes (Signed)
Dr. Leonette Monarch given results of istat

## 2020-11-24 NOTE — Progress Notes (Signed)
Patient remains somnolent on BIPAP and so repeat ABG was ordered as well as head CT.  Cardiology has seen the patient and has signed off.   Head CT is still pending.  ABG: 7.285/87.3/108/41.7 - essentially unchanged despite reimplementation of BIPAP.  Will consult PCCM as patient may require intubation.   Carlyon Shadow, M.D.

## 2020-11-24 NOTE — Progress Notes (Signed)
[name] [id number] Admission Data: 11/24/2020 7:51 PM Attending Provider: Laurin Coder, MD  FFM:[id number], [name], NP Consults/ Treatment Team:   [name] is a 55 y.o. male patient admitted from ED awake, alert  & orientated  X 3,  Full Code, VSS - Blood pressure (!) 146/83, pulse (!) 41, temperature 98 F (36.7 C), temperature source Axillary, resp. rate 20, height 5\' 6"  (1.676 m), weight 100.2 kg, SpO2 95 %., O2   5L nasal cannular, no c/o shortness of breath, no c/o chest pain, no distress noted. Tele # [id number] placed and pt is currently running:sinus bradycardia.   IV site WDL:  forearm right, condition patent and no redness with a transparent dsg that's clean dry and intact.     Will cont to monitor and assist as needed.  Hosie Spangle, [place] 11/24/2020 7:51 PM

## 2020-11-24 NOTE — ED Triage Notes (Addendum)
Pt BIB EMS from home. EMS reports pt went to bathroom, possible vageled down and hit his head on the tub. Pt reports LOC. When EMS found pt he was SOB with decreased lung sounds. Pt started on 15L and eventually switched to CPAP (O2 96%). Pt reports he is breathing a little better on CPAP. Pt got 2.5 of Albuterol per EMS.  Initial BP 200/110 and last 120/70. Pt AO and reports feeling tired -  ETOH and cocaine on board

## 2020-11-24 NOTE — Progress Notes (Signed)
Patient transported to 3M02 from the ED without any apparent complications.

## 2020-11-24 NOTE — Progress Notes (Signed)
Pt. Refused abg draw RN aware.
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
## 2020-11-24 NOTE — Progress Notes (Signed)
[facility] Progress Note Patient Name: [name] DOB: 1966/02/25 MRN: [id number]   Date of Service  11/24/2020  HPI/Events of Note  Patient demanding to eat. Wide awake, threatening to leave AMA.   eICU Interventions  Ordered diet. Transitioned steroids to PO once daily.     Intervention Category Intermediate Interventions: Other:  Charlott Rakes 11/24/2020, 7:58 PM

## 2020-11-24 NOTE — ED Notes (Signed)
Lab to add on troponin  

## 2020-11-24 NOTE — ED Notes (Signed)
Lab Results reported to Nurse.
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
## 2020-11-24 NOTE — ED Notes (Signed)
Dr. Betsey Holiday at bedside

## 2020-11-24 NOTE — Progress Notes (Signed)
Patient developed profound bradycardia into the 20s with increased somnolence.  Repeat VBG confirms pCO2 85.  Patient placed back on BIPAP, cardiology consulted - although suspect that bradycardia was driven by respiratory decompensation.  Carlyon Shadow, M.D.
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
## 2020-11-24 NOTE — ED Notes (Signed)
Dr. Nevada Crane paged and notified of change from 7/5 troponin of 32 to 55
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
## 2020-11-24 NOTE — Telephone Encounter (Signed)
Transition Care Management Unsuccessful Follow-up Telephone Call  Date of discharge and from where:  11/22/2020, [facility] , Left AMA  Attempts:  2nd Attempt  Reason for unsuccessful TCM follow-up call:  patient currently in ED at [facility]

## 2020-11-24 NOTE — ED Notes (Signed)
Removed from Bipap placed on 4L [place]

## 2020-11-24 NOTE — ED Notes (Signed)
Pt lethargic, able to arouse with moderate stimulation, easily doses back off. HR intermittently dropping to high 20's to mid 30's staying consistently upper 40's to low 50's.  Dr. Lorin Mercy made aware. New orders given and completed. Pt placed back on Bipap.

## 2020-11-24 NOTE — Progress Notes (Signed)
Pt shared that he does want to get help and stay in hospital until he is better. He mentioned multiple occurrences of SOB episodes and falls at home. Stated he fell 3 times in a night within the previous week. He also stated he drinks 3-4 beers day. Pt was educated on BiPAP compliance as well as alcohol withdrawal monitoring. Will continue to follow.

## 2020-11-25 ENCOUNTER — Telehealth: Payer: Self-pay

## 2020-11-25 LAB — BASIC METABOLIC PANEL
Anion gap: 5 (ref 5–15)
BUN: 21 mg/dL — ABNORMAL HIGH (ref 6–20)
CO2: 38 mmol/L — ABNORMAL HIGH (ref 22–32)
Calcium: 8.6 mg/dL — ABNORMAL LOW (ref 8.9–10.3)
Chloride: 91 mmol/L — ABNORMAL LOW (ref 98–111)
Creatinine, Ser: 0.97 mg/dL (ref 0.61–1.24)
GFR, Estimated: >60 mL/min (ref >60)
Glucose, Bld: 133 mg/dL — ABNORMAL HIGH (ref 70–99)
Potassium: 3.9 mmol/L (ref 3.5–5.1)
Sodium: 134 mmol/L — ABNORMAL LOW (ref 135–145)

## 2020-11-25 LAB — COMPREHENSIVE METABOLIC PANEL
ALT: 31 U/L (ref 0–44)
AST: 26 U/L (ref 15–41)
Albumin: 3.2 g/dL — ABNORMAL LOW (ref 3.5–5.0)
Alkaline Phosphatase: 58 U/L (ref 38–126)
Anion gap: 6 (ref 5–15)
BUN: 19 mg/dL (ref 6–20)
CO2: 35 mmol/L — ABNORMAL HIGH (ref 22–32)
Calcium: 8.4 mg/dL — ABNORMAL LOW (ref 8.9–10.3)
Chloride: 92 mmol/L — ABNORMAL LOW (ref 98–111)
Creatinine, Ser: 0.93 mg/dL (ref 0.61–1.24)
GFR, Estimated: >60 mL/min (ref >60)
Glucose, Bld: 187 mg/dL — ABNORMAL HIGH (ref 70–99)
Potassium: 3.7 mmol/L (ref 3.5–5.1)
Sodium: 133 mmol/L — ABNORMAL LOW (ref 135–145)
Total Bilirubin: 0.6 mg/dL (ref 0.3–1.2)
Total Protein: 6.3 g/dL — ABNORMAL LOW (ref 6.5–8.1)

## 2020-11-25 LAB — MAGNESIUM: Magnesium: 2 mg/dL (ref 1.7–2.4)

## 2020-11-25 LAB — HEPATIC FUNCTION PANEL
ALT: 34 U/L (ref 0–44)
AST: 23 U/L (ref 15–41)
Albumin: 3.3 g/dL — ABNORMAL LOW (ref 3.5–5.0)
Alkaline Phosphatase: 44 U/L (ref 38–126)
Bilirubin, Direct: 0.2 mg/dL (ref 0.0–0.2)
Indirect Bilirubin: 0.6 mg/dL (ref 0.3–0.9)
Total Bilirubin: 0.8 mg/dL (ref 0.3–1.2)
Total Protein: 6.4 g/dL — ABNORMAL LOW (ref 6.5–8.1)

## 2020-11-25 LAB — GLUCOSE, CAPILLARY
Glucose-Capillary: 107 mg/dL — ABNORMAL HIGH (ref 70–99)
Glucose-Capillary: 228 mg/dL — ABNORMAL HIGH (ref 70–99)
Glucose-Capillary: 211 mg/dL — ABNORMAL HIGH (ref 70–99)
Glucose-Capillary: 278 mg/dL — ABNORMAL HIGH (ref 70–99)
Glucose-Capillary: 266 mg/dL — ABNORMAL HIGH (ref 70–99)

## 2020-11-25 LAB — CBC
HCT: 44.8 % (ref 39.0–52.0)
Hemoglobin: 15 g/dL (ref 13.0–17.0)
MCH: 36.5 pg — ABNORMAL HIGH (ref 26.0–34.0)
MCHC: 33.5 g/dL (ref 30.0–36.0)
MCV: 109 fL — ABNORMAL HIGH (ref 80.0–100.0)
Platelets: 194 10*3/uL (ref 150–400)
RBC: 4.11 MIL/uL — ABNORMAL LOW (ref 4.22–5.81)
RDW: 14.2 % (ref 11.5–15.5)
WBC: 9.5 10*3/uL (ref 4.0–10.5)
nRBC: 0 % (ref 0.0–0.2)

## 2020-11-25 LAB — PHOSPHORUS: Phosphorus: 4.1 mg/dL (ref 2.5–4.6)

## 2020-11-25 LAB — MRSA NEXT GEN BY PCR, NASAL: MRSA by PCR Next Gen: NOT DETECTED

## 2020-11-25 MED ORDER — LACTULOSE 10 GM/15ML PO SOLN
10.0000 g | Freq: Two times a day (BID) | ORAL | Status: DC
  Administered 2020-11-25 – 2020-11-26 (×3): 10 g via ORAL
  Filled 2020-11-25 (×3): qty 15

## 2020-11-25 MED ORDER — ENSURE MAX PROTEIN PO LIQD
11.0000 [oz_av] | Freq: Every day | ORAL | Status: DC
  Administered 2020-11-25 – 2020-11-26 (×2): 11 [oz_av] via ORAL
  Filled 2020-11-25 (×3): qty 330

## 2020-11-25 MED ORDER — LORAZEPAM 1 MG PO TABS: 1.0000 mg | ORAL_TABLET | ORAL | Status: DC | PRN

## 2020-11-25 MED ORDER — LORAZEPAM 2 MG/ML IJ SOLN
1.0000 mg | INTRAMUSCULAR | Status: DC | PRN
Start: 2020-11-25 — End: 2020-11-27
  Administered 2020-11-26 (×2): 2 mg via INTRAVENOUS
  Administered 2020-11-26: 3 mg via INTRAVENOUS
  Administered 2020-11-26 – 2020-11-27 (×2): 2 mg via INTRAVENOUS
  Filled 2020-11-25: qty 2
  Filled 2020-11-25 (×2): qty 1
  Filled 2020-11-25: qty 2
  Filled 2020-11-25: qty 1
  Filled 2020-11-25: qty 2

## 2020-11-25 MED ORDER — INSULIN ASPART 100 UNIT/ML IJ SOLN
0.0000 [IU] | Freq: Three times a day (TID) | INTRAMUSCULAR | Status: DC
  Administered 2020-11-25: 5 [IU] via SUBCUTANEOUS
  Administered 2020-11-25: 8 [IU] via SUBCUTANEOUS
  Administered 2020-11-25: 5 [IU] via SUBCUTANEOUS
  Administered 2020-11-26: 8 [IU] via SUBCUTANEOUS
  Administered 2020-11-26: 11 [IU] via SUBCUTANEOUS
  Administered 2020-11-26: 2 [IU] via SUBCUTANEOUS
  Administered 2020-11-27: 3 [IU] via SUBCUTANEOUS

## 2020-11-25 NOTE — Progress Notes (Signed)
RT placed pt on Bipap and within 5 mins pt refused and wanted to come off. Pt stated he can not sleep with the bipap on.

## 2020-11-25 NOTE — Evaluation (Signed)
Physical Therapy Evaluation Patient Details Name: [name] MRN: [id number] DOB: 1965-12-18 Today's Date: 11/25/2020   History of Present Illness  55 y.o. male presenting to ED 7/7 with SOB, bradycardia, cough, and syncope. Admitted with acute on chronic hypercarbic respiratory failure, probable COPD, and toxic vs metabolic encephalopathy. Previous admission 2 months ago with bil pulmonary infiltrates, possibly recurrent pneumonia with chronic bronchiectatic changes. Patient left AMA. PMHx significant for CHF, uncontrolled DM, ETOH use, and polysubstance use disorder( IVDU & cocaine).   Clinical Impression  Pt admitted with above diagnosis. PTA pt independent with mobility/ADLs. On eval, he required supervision transfers and in room ambulation without AD. Steady gait noted. Pt on 4L with SpO2 96%. Pt currently with functional limitations due to the deficits listed below (see PT Problem List). Pt will benefit from skilled PT to increase their independence and safety with mobility to allow discharge back to previous environment. PT to follow acutely to further assess hallway ambulation/O2 needs. No follow up services indicated.        Follow Up Recommendations No PT follow up    Equipment Recommendations  None recommended by PT    Recommendations for Other Services       Precautions / Restrictions Precautions Precautions: None Restrictions Weight Bearing Restrictions: No      Mobility  Bed Mobility Overal bed mobility: Independent             General bed mobility comments: Increased time; does not require external assist; no use of bed rails    Transfers Overall transfer level: Needs assistance Equipment used: None Transfers: Sit to/from Stand;Stand Pivot Transfers Sit to Stand: Supervision Stand pivot transfers: Supervision       General transfer comment: Supervision A for safety/line management  Ambulation/Gait Ambulation/Gait assistance: Supervision Gait  Distance (Feet): 10 Feet Assistive device: None Gait Pattern/deviations: WFL(Within Functional Limits)     General Gait Details: only agreeable to walk bed to window in room. SpO2 96% on 4L  Stairs            Wheelchair Mobility    Modified Rankin (Stroke Patients Only)       Balance Overall balance assessment: Mild deficits observed, not formally tested                                           Pertinent Vitals/Pain Pain Assessment: No/denies pain    Home Living Family/patient expects to be discharged to:: Other (Comment)                 Additional Comments: Lives out of his car vs motel (when able to afford). Hopeful for residential drug rehab post d/c. Reports PRN home O2 (keeps at his mothers house) but has not used recently.    Prior Function Level of Independence: Independent               Hand Dominance   Dominant Hand: Right    Extremity/Trunk Assessment   Upper Extremity Assessment Upper Extremity Assessment: Overall WFL for tasks assessed    Lower Extremity Assessment Lower Extremity Assessment: Overall WFL for tasks assessed    Cervical / Trunk Assessment Cervical / Trunk Assessment: Normal Cervical / Trunk Exceptions: Body habitus  Communication   Communication: No difficulties  Cognition Arousal/Alertness: Awake/alert Behavior During Therapy: Impulsive Overall Cognitive Status: No family/caregiver present to determine baseline cognitive functioning Area of Impairment: Safety/judgement  Safety/Judgement: Decreased awareness of safety     General Comments: A&Ox4; impulsive; decreased safety awareness.      General Comments General comments (skin integrity, edema, etc.): 4L O2 via [place]; SpO2 96% at resta and 93% with mobility    Exercises     Assessment/Plan    PT Assessment Patient needs continued PT services  PT Problem List Decreased mobility;Decreased activity  tolerance;Cardiopulmonary status limiting activity       PT Treatment Interventions Therapeutic activities;Gait training;Therapeutic exercise;Patient/family education;Balance training;Functional mobility training;Stair training    PT Goals (Current goals can be found in the Care Plan section)  Acute Rehab PT Goals Patient Stated Goal: To go to drug rehab. PT Goal Formulation: With patient Time For Goal Achievement: 12/09/20 Potential to Achieve Goals: Good    Frequency Min 3X/week   Barriers to discharge        Co-evaluation               AM-PAC PT "6 Clicks" Mobility  Outcome Measure Help needed turning from your back to your side while in a flat bed without using bedrails?: None Help needed moving from lying on your back to sitting on the side of a flat bed without using bedrails?: None Help needed moving to and from a bed to a chair (including a wheelchair)?: A Little Help needed standing up from a chair using your arms (e.g., wheelchair or bedside chair)?: A Little Help needed to walk in hospital room?: A Little Help needed climbing 3-5 steps with a railing? : A Little 6 Click Score: 20    End of Session Equipment Utilized During Treatment: Oxygen Activity Tolerance: Patient tolerated treatment well Patient left: in bed;with call bell/phone within reach Nurse Communication: Mobility status PT Visit Diagnosis: Difficulty in walking, not elsewhere classified (R26.2)    Time: 9485-4627 PT Time Calculation (min) (ACUTE ONLY): 15 min   Charges:   PT Evaluation $PT Eval Low Complexity: 1 Low          Lorrin Goodell, PT  Office # [phone] Pager [phone]   Lorriane Shire 11/25/2020, 12:15 PM

## 2020-11-25 NOTE — Progress Notes (Signed)
Initial Nutrition Assessment  DOCUMENTATION CODES:   Obesity unspecified  INTERVENTION:   - Ensure Max po daily, each supplement provides 150 kcal and 30 grams of protein  - Double protein portions TID with meals  - MVI with minerals daily  NUTRITION DIAGNOSIS:   Increased nutrient needs related to chronic illness (CHF, COPD) as evidenced by estimated needs.  GOAL:   Patient will meet greater than or equal to 90% of their needs  MONITOR:   PO intake, Supplement acceptance, Labs, Weight trends, I & O's  REASON FOR ASSESSMENT:   Consult Other (nutritional goals)  ASSESSMENT:   55 year old male who presented to the ED on 7/07 with SOB. PMH of HTN, CHF, EtOH abuse, polysubstance abuse including IVDA, vertebral osteomyelitis, COPD, tobacco abuse, DM, COVID-19 infection in January 2022. Pt last admitted on 7/05 and left AMA. Pt admitted with acute on chronic respiratory failure with hypercapnia, syncope.  Spoke with pt at bedside. Pt not very forthcoming with diet and weight history information. Pt is mostly frustrated about the food here and states that it tastes terrible. Pt reports consuming 100% of his breakfast meal tray this morning but states that he did not enjoy it. Pt also reports that it was not enough food. Pt agreeable with RD ordering double protein portions TID with meals.  When RD asks about PO intake PTA, pt reports that he typically eats 2-3 "normal meals" daily. Pt denies any recent weight changes. He denies any issues chewing or swallowing. Pt also denies any N/V.  Reviewed weight history in chart. Pt's weight has fluctuated between 93-105 kg over the last year. Weight has been trending down since 08/23/20 when weight was 105.6 kg. Current weight is 100.2 kg. This is a 5.4 kg (5.1%) weight loss in 3 months which is not significant for timeframe.  In addition to receiving double protein portions, pt is willing to consume a protein shake to aid in meeting kcal and  protein needs. Will order Ensure Max. Pt currently on MVI with minerals.  Medications reviewed and include: colace, pepcid, folic acid, SSI, lactulose, MVI with minerals, prednisone, thiamine  Labs reviewed: sodium 134, BUN 21 CBG's: 107-228 x 24 hours  UOP: 1200 ml x 12 hours I/O's: -120 ml since admit  NUTRITION - FOCUSED PHYSICAL EXAM:  Flowsheet Row Most Recent Value  Orbital Region No depletion  Upper Arm Region No depletion  Thoracic and Lumbar Region No depletion  Buccal Region No depletion  Temple Region No depletion  Clavicle Bone Region No depletion  Clavicle and Acromion Bone Region No depletion  Scapular Bone Region No depletion  Dorsal Hand No depletion  Patellar Region No depletion  Anterior Thigh Region No depletion  Posterior Calf Region No depletion  Edema (RD Assessment) None  Hair Reviewed  Eyes Reviewed  Mouth Reviewed  Skin Reviewed  Nails Reviewed       Diet Order:   Diet Order             Diet Carb Modified Fluid consistency: Thin; Room service appropriate? Yes  Diet effective now                   EDUCATION NEEDS:   Education needs have been addressed  Skin:  Skin Assessment: Reviewed RN Assessment  Last BM:  no documented BM  Height:   Ht Readings from Last 1 Encounters:  11/24/20 5\' 6"  (1.676 m)    Weight:   Wt Readings from Last 1 Encounters:  11/24/20  100.2 kg    BMI:  Body mass index is 35.65 kg/m.  Estimated Nutritional Needs:   Kcal:  2000-2200  Protein:  100-115 grams  Fluid:  >/= 2.0 L    Gustavus Bryant, MS, RD, LDN Inpatient Clinical Dietitian Please see AMiON for contact information.

## 2020-11-25 NOTE — TOC Initial Note (Signed)
Transition of Care [facility]) - Initial/Assessment Note    Patient Details  Name: [name] MRN: [id number] Date of Birth: Oct 10, 1965  Transition of Care [facility]) CM/SW Contact:    Verdell Carmine, RN Phone Number: 11/25/2020, 9:59 AM  Clinical Narrative:                  55 yo, hx COPD on oxygen, polysubstance abuse, frequent admissions for exacerbation of COPD. Lives in a motel/Hotel. Stated to doctor that he cannot afford medications. Positive for cocaine on admission, and admits use. Patient has medicaid, he is not eligible for [facility] assistance.  Will DC back to hotel/motel when ready for DC.   Expected Discharge Plan: Home/Self Care Barriers to Discharge: Continued Medical Work up   Patient Goals and CMS Choice        Expected Discharge Plan and Services Expected Discharge Plan: Home/Self Care       Living arrangements for the past 2 months: Hotel/Motel                                      Prior Living Arrangements/Services Living arrangements for the past 2 months: Hotel/Motel Lives with:: Self Patient language and need for interpreter reviewed:: Yes        Need for Family Participation in Patient Care: Yes (Comment) Care giver support system in place?: Yes (comment) Current home services: DME (oxygen) Criminal Activity/Legal Involvement Pertinent to Current Situation/Hospitalization: No - Comment as needed  Activities of Daily Living      Permission Sought/Granted                  Emotional Assessment   Attitude/Demeanor/Rapport: Lethargic   Orientation: : Fluctuating Orientation (Suspected and/or reported Sundowners) Alcohol / Substance Use: Illicit Drugs, Tobacco Use Psych Involvement: No (comment)  Admission diagnosis:  COPD (chronic obstructive pulmonary disease) ([place]) [J44.9] Acute encephalopathy [G93.40] Acute on chronic respiratory failure with hypoxia (HCC) [J96.21] Syncope, unspecified syncope type [R55] Acute on chronic  respiratory failure with hypoxia and hypercapnia (HCC) [H37.16, J96.22] Patient Active Problem List   Diagnosis Date Noted   Acute on chronic respiratory failure with hypercapnia ([place]) 11/24/2020   Uncontrolled diabetes mellitus ([place]) 11/24/2020   Class 2 obesity due to excess calories with body mass index (BMI) of 35.0 to 35.9 in adult 11/24/2020   Acute encephalopathy 11/24/2020   Syncope    Acute pulmonary edema ([place]) 11/22/2020   Obesity, Class III, BMI 40-49.9 (morbid obesity) ([place]) 11/22/2020   Obstructive sleep apnea 11/22/2020   Frequent falls 11/22/2020   Obesity hypoventilation syndrome ([place]) 10/02/2020   Acute respiratory failure with hypercapnia ([place]) 09/22/2020   History of COVID-19 09/22/2020   Abnormal chest x-ray 09/22/2020   Obesity (BMI 30.0-34.9) 09/22/2020   HTN (hypertension) 06/25/2020   Acute on chronic respiratory failure with hypoxia and hypercapnia ([place]) 06/25/2020   Pneumonia due to COVID-19 virus 06/22/2020   Acute respiratory failure with hypoxia ([place]) 06/22/2020   Acute heart failure with preserved ejection fraction (HFpEF) ([place]) 11/30/2019   Acute on chronic diastolic CHF (congestive heart failure) ([place])    Uncontrolled hypertension 10/21/2019   COPD with acute exacerbation ([place]) 10/20/2019   COPD exacerbation ([place]) 10/19/2019   Sinus bradycardia    Acute exacerbation of chronic obstructive pulmonary disease (COPD) ([place]) 10/17/2019   Elevated liver enzymes 06/11/2017   Abnormal liver function tests 06/10/2017   Chronic diastolic CHF (congestive heart  failure) ([place]) 06/10/2017   Hypokalemia 06/10/2017   Hypomagnesemia 06/10/2017   Vertebral osteomyelitis, chronic ([place]) 05/31/2017   Marijuana use 05/31/2017   Osteomyelitis ([place]) 05/24/2017   Discitis 05/24/2017   Diskitis 03/05/2017   Cocaine use    Alcohol abuse    Polysubstance abuse ([place])    Epidural abscess    Discitis thoracic region 02/26/2017   Thoracic back pain    Tobacco abuse     Essential hypertension    Fungal endocarditis    Suicidal ideation    IVDU (intravenous drug user)    Chronic bilateral low back pain without sciatica    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[place])    Vertebral osteomyelitis ([place]) 02/16/2017   Cocaine abuse ([place]) 10/09/2015   Homeless single person    COPD (chronic obstructive pulmonary disease) ([place]) 10/08/2015   PCP:  [name], NP Pharmacy:   [facility] ([place] [place] - [address] [place] [address] Phone: [phone] Fax: [phone]     Social Determinants of Health ([place]) Interventions    Readmission Risk Interventions Readmission Risk Prevention Plan 10/10/2020 06/27/2020 10/21/2019  Transportation Screening Complete Complete Complete  PCP or Specialist Appt within 3-5 Days - - Complete  HRI or [place] - - Complete  Social Work Consult for [place] Planning/Counseling - - Complete  Palliative Care Screening - - Complete  Medication Review Press photographer) Complete Complete Complete  PCP or Specialist appointment within 3-5 days of discharge Complete Complete -  [place] or Home Care Consult Complete Complete -  SW Recovery Care/Counseling Consult Complete Complete -  Palliative Care Screening Not Applicable Not Applicable -  [place] Not Applicable Not Applicable -  Some recent data might be hidden

## 2020-11-25 NOTE — Telephone Encounter (Signed)
Transition Care Management Unsuccessful Follow-up Telephone Call   Date of discharge and from where:  11/22/2020, [facility] , Left AMA   Attempts:  3rd Attempt   Reason for unsuccessful TCM follow-up call:  patient currently in ED at [facility]

## 2020-11-25 NOTE — H&P (Addendum)
This is a progress note     NAME:  [name], MRN:  [id number], DOB:  31-Oct-1965, LOS: 1 ADMISSION DATE:  11/24/2020, CONSULTATION DATE:  11/24/2020  REFERRING MD:  Dr. Lorin Mercy, CHIEF COMPLAINT:  sob   History of Present Illness:  Patient is a 55 yo M w/ PMH of HTN, chronic diastolic CHF, ETOH dependence; polysubstance abuse including IVDA with h/o vertebral osteomyelitis, OSA, OHS, and probable COPD on home O2 with ongoing tobacco use presenting to [facility] on 7/7 with SOB.  Patient has frequent admissions this year for COPD exacerbation and polysubstance abuse. Patient was last admitted on 7/5 for SOB and left AMA. States he left AMA because he was unable to eat. Patient has no documented PFTs in chart. Wears Home O2 and is currently living in hotel. He hasn't been using his medications because he can't afford his medicine charged by Medicaid. Last echo 09/23/2020: EF 95-09%, grade 1 diastolic dysfunction. He admits that he continues to drink alcohol, use cocaine, and heroin and used last night on 7/6. He was sitting on the toilet and bent over to wipe and the seat was loose and he fell in the floor and lost consciousness. He was SOB and brought to [facility] on 7/7.   ED Course: Patient admitted to [facility] on 7/7 sob. Started on NRB. ABG showing respiratory acidosis with Paco2 in 80s. Started on BiPAP. Patient hard to arouse. Patient HR in 40s. BP stable. O2 sats 100%. Troponin 32 to 55. K 3.1, Glucose, 263, BUN 22, Creat 1.29, BNP 214, WBC 10.8. UDS positive for cocaine. CXR shows cardiomegaly. CT head negative. After being on Bipap, repeat ABG essentially unchanged: 7.285/87.3/108/41.7.   PCCM consulted for admission to ICU and medical/airway management.  Pertinent  Medical History   Past Medical History:  Diagnosis Date   CHF (congestive heart failure) (HCC)    Chronic bronchitis (HCC)    Chronic lower back pain    COPD (chronic obstructive pulmonary disease) ([place])    Diskitis    Hypertension    Panic  attacks    Tobacco abuse    Uncontrolled diabetes mellitus ([place]) 11/24/2020     Significant Hospital Events: Including procedures, antibiotic start and stop dates in addition to other pertinent events   7/7: admitted to [facility] hypercarbic resp failure; on bipap; abg resp acidosis  Interim History / Subjective:  Now awake, eating, feeling better.   Objective   Blood pressure 140/87, pulse (!) 52, temperature 98.7 F (37.1 C), temperature source Oral, resp. rate (!) 21, height 5\' 6"  (1.676 m), weight 100.2 kg, SpO2 98 %.    FiO2 (%):  [30 %-40 %] 30 %   Intake/Output Summary (Last 24 hours) at 11/25/2020 3267 Last data filed at 11/25/2020 0600 Gross per 24 hour  Intake 1480.44 ml  Output 1200 ml  Net 280.44 ml    Filed Weights   11/24/20 0757  Weight: 100.2 kg    Examination: Constitutional: chronically ill appearing man sitting in bed  Eyes: pupils equal, reactive Ears, nose, mouth, and throat: malampatti 3, trachea midline Cardiovascular: RRR, ext warm Respiratory: Severely diminished, no accessory muscle use Gastrointestinal: soft, hypoactive BS Skin: No rashes, normal turgor Neurologic: moves all 4 ext to command Psychiatric: Aox3 but poor insight   Resolved Hospital Problem list     Assessment & Plan:  Acute on chronic hypercarbic respiratory failure Probable COPD, no formal PFTs in chart- presumed in flare, improved OSA/OHS Tobacco abuse Polysubstance abuse including cocaine Alcohol abuse  Toxic/metabolic encephalopathy- resolved Bradycardia  - CIWA monitoring - Thiamine/folate - SSI to qACHS - BIPAP to qHS - Wean O2 to maintain sats > 90% - Nebs as ordered - PT consult - Stable for progressive - Needs to stop doing cocaine  Erskine Emery MD PCCM

## 2020-11-25 NOTE — Evaluation (Signed)
Occupational Therapy Evaluation Patient Details Name: [name] MRN: [id number] DOB: 12-17-1965 Today's Date: 11/25/2020    History of Present Illness 55 y.o. male presenting to ED 7/7 with SOB, bradycardia, cough, and syncope. Admitted with acute on chronic hypercarbic respiratory failure, probable COPD, and toxic vs metabolic encephalopathy. Previous admission 2 months ago with bil pulmonary infiltrates, possibly recurrent pneumonia with chronic bronchiectatic changes. Patient left AMA. PMHx significant for CHF, uncontrolled DM, ETOH use, and polysubstance use disorder( IVDU & cocaine).   Clinical Impression   Patient with Hx of housing insecurity reporting living in his car vs motel (when able to afford). Patient I with ADLs/IADLs at baseline. Reports that his car is currently not running. Patient currently functioning below baseline demonstrating observed ADLs including LB dressing and ADL transfers with supervision A on 4L O2 via [place]. Patient declined increased mobility this date but demonstrates short-distance functional mobility up to 50ft in hospital room with supervision A for safety/line management. Patient also limited by deficits listed below including impulsivity and mild balance deficits and would benefit from continued acute OT services in prep for safe d/c. Patient hopeful for residential drug rehab.      Follow Up Recommendations  No OT follow up;Other (comment) (Patient hopeful for residential drug rehab)    Equipment Recommendations  None recommended by OT    Recommendations for Other Services       Precautions / Restrictions Precautions Precautions: Fall Restrictions Weight Bearing Restrictions: No      Mobility Bed Mobility Overal bed mobility: Modified Independent             General bed mobility comments: Increased time; does not require external assist; no use of bed rails    Transfers Overall transfer level: Needs assistance Equipment used:  None Transfers: Sit to/from Stand Sit to Stand: Supervision         General transfer comment: Supervision A for safety/line management    Balance Overall balance assessment: Mild deficits observed, not formally tested                                         ADL either performed or assessed with clinical judgement   ADL Overall ADL's : Needs assistance/impaired                     Lower Body Dressing: Supervision/safety;Sit to/from stand Lower Body Dressing Details (indicate cue type and reason): Able to don footwear seated EOB with set-up assist. Toilet Transfer: Supervision/safety Toilet Transfer Details (indicate cue type and reason): Simualated with transfer to recliner with supervision A for safety/line management.         Functional mobility during ADLs: Supervision/safety General ADL Comments: Declined mobility in hallway but performed mobility in room with supervision A for safety/line management.     Vision Baseline Vision/History: No visual deficits       Perception     Praxis      Pertinent Vitals/Pain Pain Assessment: No/denies pain     Hand Dominance Right   Extremity/Trunk Assessment Upper Extremity Assessment Upper Extremity Assessment: Overall WFL for tasks assessed   Lower Extremity Assessment Lower Extremity Assessment: Defer to PT evaluation   Cervical / Trunk Assessment Cervical / Trunk Assessment: Other exceptions Cervical / Trunk Exceptions: Body habitus   Communication Communication Communication: No difficulties   Cognition Arousal/Alertness: Awake/alert Behavior During Therapy: Impulsive Overall Cognitive Status:  Impaired/Different from baseline Area of Impairment: Safety/judgement                         Safety/Judgement: Decreased awareness of safety     General Comments: A&Ox4; impulsive; decreased safety awareness.   General Comments  4L O2 via [place]; SpO2 96% at resta and 93% with  mobility    Exercises     Shoulder Instructions      Home Living Family/patient expects to be discharged to:: Other (Comment)                                 Additional Comments: Lives out of his car vs motel (when able to afford). Hopeful for residential drug rehab post d/c. Reports PRN home O2 (keeps at his mothers house) but has not used recently.      Prior Functioning/Environment Level of Independence: Independent                 OT Problem List: Impaired balance (sitting and/or standing);Decreased strength;Decreased safety awareness      OT Treatment/Interventions: Self-care/ADL training;Therapeutic exercise;Energy conservation;Therapeutic activities;Patient/family education;Balance training    OT Goals(Current goals can be found in the care plan section) Acute Rehab OT Goals Patient Stated Goal: To go to drug rehab. OT Goal Formulation: With patient Time For Goal Achievement: 12/09/20 Potential to Achieve Goals: Good ADL Goals Additional ADL Goal #1: Patient will complete ADLs with I in prep for safe return to PLOF. Additional ADL Goal #2: Patient will recall 3 energy conservation techniques in prep for ADLs.  OT Frequency: Min 2X/week   Barriers to D/C:            Co-evaluation              AM-PAC OT "6 Clicks" Daily Activity     Outcome Measure Help from another person eating meals?: None Help from another person taking care of personal grooming?: A Little Help from another person toileting, which includes using toliet, bedpan, or urinal?: A Little Help from another person bathing (including washing, rinsing, drying)?: A Little Help from another person to put on and taking off regular upper body clothing?: A Little Help from another person to put on and taking off regular lower body clothing?: A Little 6 Click Score: 19   End of Session Equipment Utilized During Treatment: Gait belt;Oxygen Nurse Communication: Mobility status;Other  (comment) (Response to treatment)  Activity Tolerance: Patient tolerated treatment well Patient left: in chair;with call bell/phone within reach;with chair alarm set  OT Visit Diagnosis: Unsteadiness on feet (R26.81);Muscle weakness (generalized) (M62.81)                Time: 6168-3729 OT Time Calculation (min): 25 min Charges:  OT General Charges $OT Visit: 1 Visit OT Evaluation $OT Eval Low Complexity: 1 Low OT Treatments $Therapeutic Activity: 8-22 mins  Candis Kabel H. OTR/L Supplemental OT, Department of rehab services [phone]  Shenell Rogalski R H. 11/25/2020, 9:17 AM

## 2020-11-26 DIAGNOSIS — J9621 Acute and chronic respiratory failure with hypoxia: Secondary | ICD-10-CM

## 2020-11-26 DIAGNOSIS — I5032 Chronic diastolic (congestive) heart failure: Secondary | ICD-10-CM

## 2020-11-26 LAB — CBC
HCT: 43.6 % (ref 39.0–52.0)
Hemoglobin: 14.6 g/dL (ref 13.0–17.0)
MCH: 36.4 pg — ABNORMAL HIGH (ref 26.0–34.0)
MCHC: 33.5 g/dL (ref 30.0–36.0)
MCV: 108.7 fL — ABNORMAL HIGH (ref 80.0–100.0)
Platelets: 167 10*3/uL (ref 150–400)
RBC: 4.01 MIL/uL — ABNORMAL LOW (ref 4.22–5.81)
RDW: 13.7 % (ref 11.5–15.5)
WBC: 9.1 10*3/uL (ref 4.0–10.5)
nRBC: 0 % (ref 0.0–0.2)

## 2020-11-26 LAB — BASIC METABOLIC PANEL
Anion gap: 5 (ref 5–15)
BUN: 16 mg/dL (ref 6–20)
CO2: 36 mmol/L — ABNORMAL HIGH (ref 22–32)
Calcium: 8.6 mg/dL — ABNORMAL LOW (ref 8.9–10.3)
Chloride: 98 mmol/L (ref 98–111)
Creatinine, Ser: 0.79 mg/dL (ref 0.61–1.24)
GFR, Estimated: >60 mL/min (ref >60)
Glucose, Bld: 122 mg/dL — ABNORMAL HIGH (ref 70–99)
Potassium: 3.2 mmol/L — ABNORMAL LOW (ref 3.5–5.1)
Sodium: 139 mmol/L (ref 135–145)

## 2020-11-26 LAB — GLUCOSE, CAPILLARY
Glucose-Capillary: 148 mg/dL — ABNORMAL HIGH (ref 70–99)
Glucose-Capillary: 199 mg/dL — ABNORMAL HIGH (ref 70–99)
Glucose-Capillary: 256 mg/dL — ABNORMAL HIGH (ref 70–99)
Glucose-Capillary: 308 mg/dL — ABNORMAL HIGH (ref 70–99)
Glucose-Capillary: 317 mg/dL — ABNORMAL HIGH (ref 70–99)

## 2020-11-26 LAB — PHOSPHORUS: Phosphorus: 2.6 mg/dL (ref 2.5–4.6)

## 2020-11-26 LAB — MAGNESIUM: Magnesium: 1.9 mg/dL (ref 1.7–2.4)

## 2020-11-26 MED ORDER — DEXMEDETOMIDINE HCL IN NACL 400 MCG/100ML IV SOLN
0.4000 ug/kg/h | INTRAVENOUS | Status: DC
  Administered 2020-11-26: 1.2 ug/kg/h via INTRAVENOUS
  Administered 2020-11-26: 0.9 ug/kg/h via INTRAVENOUS
  Administered 2020-11-27: 0.5 ug/kg/h via INTRAVENOUS
  Administered 2020-11-27: 1.6 ug/kg/h via INTRAVENOUS
  Administered 2020-11-27: 1.5 ug/kg/h via INTRAVENOUS
  Administered 2020-11-27: 1.6 ug/kg/h via INTRAVENOUS
  Administered 2020-11-27: 1.5 ug/kg/h via INTRAVENOUS
  Administered 2020-11-27: 1.2 ug/kg/h via INTRAVENOUS
  Administered 2020-11-28 (×3): 1.6 ug/kg/h via INTRAVENOUS
  Administered 2020-11-28: 0.6 ug/kg/h via INTRAVENOUS
  Filled 2020-11-26 (×4): qty 100
  Filled 2020-11-26: qty 200
  Filled 2020-11-26 (×3): qty 100
  Filled 2020-11-26 (×2): qty 200
  Filled 2020-11-26: qty 100

## 2020-11-26 MED ORDER — FUROSEMIDE 10 MG/ML IJ SOLN
40.0000 mg | Freq: Once | INTRAMUSCULAR | Status: AC
  Administered 2020-11-26: 40 mg via INTRAVENOUS

## 2020-11-26 MED ORDER — POTASSIUM CHLORIDE 20 MEQ PO PACK
40.0000 meq | PACK | Freq: Every day | ORAL | Status: DC
  Administered 2020-11-26: 40 meq via ORAL
  Filled 2020-11-26: qty 2

## 2020-11-26 MED ORDER — FUROSEMIDE 10 MG/ML IJ SOLN
20.0000 mg | Freq: Two times a day (BID) | INTRAMUSCULAR | Status: DC
  Administered 2020-11-26 – 2020-11-27 (×2): 20 mg via INTRAVENOUS
  Filled 2020-11-26 (×2): qty 2

## 2020-11-26 MED ORDER — FUROSEMIDE 40 MG PO TABS
40.0000 mg | ORAL_TABLET | Freq: Two times a day (BID) | ORAL | Status: DC
  Filled 2020-11-26: qty 1

## 2020-11-26 MED ORDER — POTASSIUM CHLORIDE CRYS ER 20 MEQ PO TBCR
40.0000 meq | EXTENDED_RELEASE_TABLET | Freq: Once | ORAL | Status: AC
  Administered 2020-11-26: 40 meq via ORAL
  Filled 2020-11-26: qty 2

## 2020-11-26 MED ORDER — LISINOPRIL 10 MG PO TABS: 40.0000 mg | ORAL_TABLET | Freq: Every day | ORAL | Status: DC

## 2020-11-26 MED ORDER — FUROSEMIDE 10 MG/ML IJ SOLN
INTRAMUSCULAR | Status: AC
  Filled 2020-11-26: qty 4

## 2020-11-26 NOTE — Plan of Care (Signed)

## 2020-11-26 NOTE — Significant Event (Signed)
Rapid Response Event Note   Reason for Call :  Patient with increased restlessness and complaining of shortness of breath.  Initial Focused Assessment:  Pt lying on his side in the bed.  He will reposition himself in the bed.  He does not want to sit up.  He states that he can't breath.  He has increased work of breathing.  RR 30s-40s. Lung sounds with crackles on left Heart tones regular.  BP 160/74  SR 70-80s  RR 30-40s  O2 sat 96% on NRB mask  RN giving Ativan IV CIWA 18   Interventions:  Placed on Bipap 30%  18/8 He will follow simple commands but is very drowsy 40mg  Lasix IV  Dr Sloan Leiter at bedside, CCM consulted Dr Ruthann Cancer at bedside to assess patient  Patient fluctuates between being drowsy/somnolent and very restless/trying to jump out of bed.  Plan of Care:  Transfer to ICU Precedex gtt   Event Summary:   MD Notified:  [place] Call Time: [place] Time: [place] Time: [place]  Raliegh Ip, RN

## 2020-11-26 NOTE — Progress Notes (Signed)
Pt. continues to remain anxious and jumping out of bed pacing back and forth and then getting back in bed. Repeatedly calling out I cant breath O2 remains 97% on room air. Labored breathing noted. MD made aware, rapid response called. CWIA reassess and PRN given. Crackles noted in lungs, verbal order for IV lasix now.

## 2020-11-26 NOTE — Progress Notes (Signed)
0005 Pt refused bladder scan, voiding qs using urinal-clear yellow urine.   [place] Notified physician on call of pt b/p outside of parameters @180 /99 and Hydralazine 5mg  was given. B/p recheck was 166/83 and Hydralazine 5mg  will be repeated in 4hrs if B/P remains outside of parameters.  0405 B/P 154/75, will cont to monitor.

## 2020-11-26 NOTE — Progress Notes (Signed)
Throughout the day-has been getting more anxious/dyspneic-scoring on CIWA scale-given numerous doses of Ativan.  Now much more obtunded-short of breath-placed on BiPAP.  On arrival: Facial flushing Some distress-hard to arouse but will open eyes-speak a few words to sternal sternal rub. [place] air well  Assessment/plan Probable developing delirium tremens Worsening respiratory distress-due to Ativan/underlying COPD/OSA.  Placed on BiPAP PCCM consulted-likely will need to be moved back to the ICU for Precedex infusion.

## 2020-11-26 NOTE — Progress Notes (Signed)
This is a progress note     NAME:  [name], MRN:  [id number], DOB:  September 06, 1965, LOS: 2 ADMISSION DATE:  11/24/2020, CONSULTATION DATE:  11/24/2020  REFERRING MD:  Dr. Lorin Mercy, CHIEF COMPLAINT:  sob   History of Present Illness:  Patient is a 55 yo M w/ PMH of HTN, chronic diastolic CHF, ETOH dependence; polysubstance abuse including IVDA with h/o vertebral osteomyelitis, OSA, OHS, and probable COPD on home O2 with ongoing tobacco use presenting to [facility] on 7/7 with SOB.  Patient has frequent admissions this year for COPD exacerbation and polysubstance abuse. Patient was last admitted on 7/5 for SOB and left AMA. States he left AMA because he was unable to eat. Patient has no documented PFTs in chart. Wears Home O2 and is currently living in hotel. He hasn't been using his medications because he can't afford his medicine charged by Medicaid. Last echo 09/23/2020: EF 57-01%, grade 1 diastolic dysfunction. He admits that he continues to drink alcohol, use cocaine, and heroin and used last night on 7/6. He was sitting on the toilet and bent over to wipe and the seat was loose and he fell in the floor and lost consciousness. He was SOB and brought to [facility] on 7/7.   ED Course: Patient admitted to [facility] on 7/7 sob. Started on NRB. ABG showing respiratory acidosis with Paco2 in 80s. Started on BiPAP. Patient hard to arouse. Patient HR in 40s. BP stable. O2 sats 100%. Troponin 32 to 55. K 3.1, Glucose, 263, BUN 22, Creat 1.29, BNP 214, WBC 10.8. UDS positive for cocaine. CXR shows cardiomegaly. CT head negative. After being on Bipap, repeat ABG essentially unchanged: 7.285/87.3/108/41.7.   PCCM consulted for admission to ICU and medical/airway management.  Pertinent  Medical History   Past Medical History:  Diagnosis Date   CHF (congestive heart failure) (HCC)    Chronic bronchitis (HCC)    Chronic lower back pain    COPD (chronic obstructive pulmonary disease) ([place])    Diskitis    Hypertension    Panic  attacks    Tobacco abuse    Uncontrolled diabetes mellitus ([place]) 11/24/2020     Significant Hospital Events: Including procedures, antibiotic start and stop dates in addition to other pertinent events   7/7: admitted to [facility] hypercarbic resp failure; on bipap; abg resp acidosis 7/9: reconsulted for sob and NIV need with ?withdrawal  Interim History / Subjective:  7/9: asked to re-eval pt for withdrawal/agitation and potential resp failure. Pt is arousable at this time but agitation waxes and wanes.   Objective   Blood pressure (!) 160/74, pulse 88, temperature (!) 97.5 F (36.4 C), temperature source Oral, resp. rate (!) 40, height 5\' 6"  (1.676 m), weight 100.2 kg, SpO2 95 %.    FiO2 (%):  [30 %] 30 %   Intake/Output Summary (Last 24 hours) at 11/26/2020 1511 Last data filed at 11/26/2020 7793 Gross per 24 hour  Intake 240 ml  Output 1850 ml  Net -1610 ml   Filed Weights   11/24/20 0757  Weight: 100.2 kg    Examination: Constitutional: chronically ill appearing man sitting in bed with NIV in place Eyes: pupils equal, reactive Ears, nose, mouth, and throat: NIV in place, mucosa was not able to be eval'd trachea midline Cardiovascular: RRR, ext warm Respiratory: tachypneic, no accessory muscle use Gastrointestinal: soft, hypoactive BS Skin: No rashes, normal turgor Neurologic: moves all 4 ext, agitated Psychiatric: Arousable, agitated intermittently   Resolved Hospital Problem list  Assessment & Plan:  Withdrawal, acute:  Acute toxic encephalopathy:  Acute agitated delirium:  -transfer to ICU for precedex if unable to remain appropriate.  -hopefully can do librium taper tomorrow.  Polysubstance abuse including cocaine Alcohol abuse  Acute on chronic hypercarbic respiratory failure -NIV as needed -no wheeze on exam.  -cont current therapies -prn nebs.  Probable COPD, no formal PFTs in chart- presumed in flare, improved OSA/OHS -as above Tobacco  abuse -encourage symptoms  Bradycardia   Critical care time: The patient is critically ill with multiple organ systems failure and requires high complexity decision making for assessment and support, frequent evaluation and titration of therapies, application of advanced monitoring technologies and extensive interpretation of multiple databases.  Critical care time 35 mins. This represents my time independent of the NPs time taking care of the pt. This is excluding procedures.    Audria Nine DO  Pulmonary and Critical Care 11/26/2020, 3:11 PM See Amion for pager If no response to pager, please call [phone] until 1900 After 1900 please call [facility] [phone]

## 2020-11-26 NOTE — Progress Notes (Signed)
Breakfast tray delivered to patients room, staff woke up pt. And he yell don't bother me Im sleeping get out Pt woke up around 11am yelling at staff they we don't care about him because we didn't wake him up when breakfast came informed pt. That staff did come in and wake him up. Pt. Continues yelling and cussing at multiple staff members that we are not providing care for him. Pt is refusing to were tele. MD made aware and is ok with no tele.

## 2020-11-26 NOTE — Progress Notes (Signed)
PROGRESS NOTE        PATIENT DETAILS Name: [name] Age: 55 y.o. Sex: male Date of Birth: 1965-11-25 Admit Date: 11/24/2020 Admitting Physician Laurin Coder, MD [id number], [name], NP  Brief Narrative: Patient is a 55 y.o. male COPD-on home O2 (only uses as needed), probable OSA/OHS, polysubstance abuse (history of vertebral osteomyelitis)-admitted by PCCM to the [place] he presented with worsening shortness of breath-felt to be due to COPD exacerbation-he was placed on BiPAP.  Upon further stability he was transferred to the [facility] service on 7/9.  Significant events: 7/5>> signed out AMA from ED. 7/7>> admit by PCCM to ICU-COPD exacerbation-requiring BiPAP 7/9>> transfer to [facility].  Significant studies: 7/5>> UDS: Positive for cocaine 7/7>> CXR: No pneumonia  Antimicrobial therapy: None  Microbiology data: 7/7>>> COVID/influenza PCR: Negative  Procedures : None  Consults: PCCM  DVT Prophylaxis : SCDs Start: 11/24/20 1632 enoxaparin (LOVENOX) injection 40 mg Start: 11/24/20 1600   Subjective: Wants to sleep-does not want me to wake him up-uncooperative.   Assessment/Plan: Acute on chronic hypercarbic respiratory failure: Due to COPD exacerbation-initially required BiPAP-now stable on just oxygen.    Acute metabolic encephalopathy: Due to hypercarbia-resolved-awake and alert.  COPD exacerbation: Improving-continue bronchodilators-prednisone.  Very uncooperative-hard to assess what his baseline is-plan is to continue therapy as noted above-and reassess on 7/10.  DM-2 (A1c 7.3 on 5/23): CBGs relatively stable on SSI-resume metformin when closer to discharge.  Recent Labs    11/25/20 1536 11/25/20 2023 11/26/20 0735  GLUCAP 278* 266* 148*    Chronic diastolic heart failure: Euvolemic on exam-resume home dose of Lasix.  Hypokalemia: We will replete and recheck  Tobacco/cocaine use: Will attempt to counsel-currently  uncooperative-doubt he has any indication to quit.  Alcohol use: No signs of withdrawal-on CIWA protocol.  Probable OSA/OHS: Needs outpatient polysomnography   Obesity: Estimated body mass index is 35.65 kg/m as calculated from the following:   Height as of this encounter: 5\' 6"  (1.676 m).   Weight as of this encounter: 100.2 kg.    Diet: Diet Order             Diet Carb Modified Fluid consistency: Thin; Room service appropriate? Yes  Diet effective now                    Code Status: Full code   Family Communication: None at bedside   Disposition Plan: Status is: Inpatient  Remains inpatient appropriate because:Inpatient level of care appropriate due to severity of illness  Dispo: The patient is from: Home              Anticipated d/c is to: Home              Patient currently is not medically stable to d/c.   Difficult to place patient No        Barriers to Discharge:  Antimicrobial agents: Anti-infectives (From admission, onward)    None        Time spent: 35 minutes-Greater than 50% of this time was spent in counseling, explanation of diagnosis, planning of further management, and coordination of care.  MEDICATIONS: Scheduled Meds:  arformoterol  15 mcg Nebulization BID   budesonide (PULMICORT) nebulizer solution  0.5 mg Nebulization BID   Chlorhexidine Gluconate Cloth  6 each Topical Daily   docusate sodium  100 mg Oral BID  enoxaparin (LOVENOX) injection  40 mg Subcutaneous Q24H   famotidine  20 mg Oral QHS   folic acid  1 mg Oral Daily   insulin aspart  0-15 Units Subcutaneous TID WC   ipratropium-albuterol  3 mL Nebulization Q6H   lactulose  10 g Oral BID   multivitamin with minerals  1 tablet Oral Daily   nicotine  7 mg Transdermal Daily   predniSONE  40 mg Oral Q breakfast   Ensure Max Protein  11 oz Oral Daily   sodium chloride flush  3 mL Intravenous Q12H   thiamine  100 mg Intravenous Daily   Or   thiamine  100 mg Oral  Daily   Continuous Infusions: PRN Meds:.acetaminophen **OR** acetaminophen, albuterol, bisacodyl, dicyclomine, guaiFENesin, hydrALAZINE, hydrOXYzine, loperamide, LORazepam **OR** LORazepam, ondansetron **OR** ondansetron (ZOFRAN) IV, polyethylene glycol   PHYSICAL EXAM: Vital signs: Vitals:   11/26/20 0000 11/26/20 0215 11/26/20 0500 11/26/20 0734  BP: (!) 166/83  (!) 150/77 (!) 154/85  Pulse: (!) 55  (!) 55 (!) 55  Resp:  20 20 19   Temp:      TempSrc:      SpO2:   100% 96%  Weight:      Height:       Filed Weights   11/24/20 0757  Weight: 100.2 kg   Body mass index is 35.65 kg/m.   Gen Exam:Alert awake-not in any distress HEENT:atraumatic, normocephalic Chest: B/L clear to auscultation anteriorly CVS:S1S2 regular Abdomen:soft non tender, non distended Extremities:no edema Neurology: Non focal Skin: no rash  I have personally reviewed following labs and imaging studies  LABORATORY DATA: CBC: Recent Labs  Lab 11/22/20 0231 11/22/20 0558 11/24/20 0415 11/24/20 0427 11/24/20 0517 11/24/20 1153 11/24/20 1500 11/25/20 0139 11/26/20 0104  WBC 8.2  --  10.8*  --   --   --   --  9.5 9.1  NEUTROABS  --   --  8.9*  --   --   --   --   --   --   HGB 14.5   < > 15.7   < > 13.9 17.0 16.0 15.0 14.6  HCT 44.1   < > 47.1   < > 41.0 50.0 47.0 44.8 43.6  MCV 109.4*  --  108.5*  --   --   --   --  109.0* 108.7*  PLT 188  --  203  --   --   --   --  194 167   < > = values in this interval not displayed.    Basic Metabolic Panel: Recent Labs  Lab 11/22/20 0231 11/22/20 0558 11/24/20 0415 11/24/20 0427 11/24/20 0517 11/24/20 1153 11/24/20 1500 11/25/20 0139 11/25/20 0829 11/26/20 0104  NA 140   < > 134*   < > 140 138 138 134* 133* 139  K 3.7   < > 3.1*   < > 2.6* 4.1 4.1 3.9 3.7 3.2*  CL 99  --  91*  --  95*  --   --  91* 92* 98  CO2 34*  --  31  --   --   --   --  38* 35* 36*  GLUCOSE 134*  --  263*  --  183*  --   --  133* 187* 122*  BUN 12  --  22*  --  24*   --   --  21* 19 16  CREATININE 0.90  --  1.29*  --  1.00  --   --  0.97 0.93 0.79  CALCIUM 9.2  --  8.5*  --   --   --   --  8.6* 8.4* 8.6*  MG  --   --  2.1  --   --   --   --  2.0  --  1.9  PHOS  --   --   --   --   --   --   --  4.1  --  2.6   < > = values in this interval not displayed.    GFR: Estimated Creatinine Clearance: 114.3 mL/min (by C-G formula based on SCr of 0.79 mg/dL).  Liver Function Tests: Recent Labs  Lab 11/24/20 0415 11/25/20 0139 11/25/20 0829  AST 52* 23 26  ALT 45* 34 31  ALKPHOS 54 44 58  BILITOT 0.9 0.8 0.6  PROT 6.8 6.4* 6.3*  ALBUMIN 3.6 3.3* 3.2*   No results for input(s): LIPASE, AMYLASE in the last 168 hours. Recent Labs  Lab 11/24/20 1637  AMMONIA 66*    Coagulation Profile: No results for input(s): INR, PROTIME in the last 168 hours.  Cardiac Enzymes: No results for input(s): CKTOTAL, CKMB, CKMBINDEX, TROPONINI in the last 168 hours.  BNP (last 3 results) No results for input(s): PROBNP in the last 8760 hours.  Lipid Profile: No results for input(s): CHOL, HDL, LDLCALC, TRIG, CHOLHDL, LDLDIRECT in the last 72 hours.  Thyroid Function Tests: No results for input(s): TSH, T4TOTAL, FREET4, T3FREE, THYROIDAB in the last 72 hours.  Anemia Panel: No results for input(s): VITAMINB12, FOLATE, FERRITIN, TIBC, IRON, RETICCTPCT in the last 72 hours.  Urine analysis:    Component Value Date/Time   COLORURINE YELLOW 06/22/2020 [place] 06/22/2020 1123   LABSPEC 1.009 06/22/2020 1123   PHURINE 6.0 06/22/2020 1123   GLUCOSEU NEGATIVE 06/22/2020 1123   HGBUR SMALL (A) 06/22/2020 1123   BILIRUBINUR NEGATIVE 06/22/2020 1123   KETONESUR NEGATIVE 06/22/2020 1123   PROTEINUR NEGATIVE 06/22/2020 1123   UROBILINOGEN 1.0 10/16/2010 0217   NITRITE NEGATIVE 06/22/2020 1123   LEUKOCYTESUR NEGATIVE 06/22/2020 1123    Sepsis Labs: Lactic Acid, Venous    Component Value Date/Time   LATICACIDVEN 1.3 10/02/2020 1627     MICROBIOLOGY: Recent Results (from the past 240 hour(s))  Resp Panel by RT-PCR (Flu A&B, Covid) Nasopharyngeal Swab     Status: None   Collection Time: 11/22/20  4:27 AM   Specimen: Nasopharyngeal Swab; Nasopharyngeal(NP) swabs in vial transport medium  Result Value Ref Range Status   SARS Coronavirus 2 by RT PCR NEGATIVE NEGATIVE Final    Comment: (NOTE) SARS-CoV-2 target nucleic acids are NOT DETECTED.  The SARS-CoV-2 RNA is generally detectable in upper respiratory specimens during the acute phase of infection. The lowest concentration of SARS-CoV-2 viral copies this assay can detect is 138 copies/mL. A negative result does not preclude SARS-Cov-2 infection and should not be used as the sole basis for treatment or other patient management decisions. A negative result may occur with  improper specimen collection/handling, submission of specimen other than nasopharyngeal swab, presence of viral mutation(s) within the areas targeted by this assay, and inadequate number of viral copies(<138 copies/mL). A negative result must be combined with clinical observations, patient history, and epidemiological information. The expected result is Negative.  Fact Sheet for Patients:  EntrepreneurPulse.com.au  Fact Sheet for Healthcare Providers:  IncredibleEmployment.be  This test is no t yet approved or cleared by the [place] FDA and  has been authorized for detection and/or  diagnosis of SARS-CoV-2 by FDA under an Emergency Use Authorization (EUA). This EUA will remain  in effect (meaning this test can be used) for the duration of the COVID-19 declaration under Section 564(b)(1) of the Act, 21 U.S.C.section 360bbb-3(b)(1), unless the authorization is terminated  or revoked sooner.       Influenza A by PCR NEGATIVE NEGATIVE Final   Influenza B by PCR NEGATIVE NEGATIVE Final    Comment: (NOTE) The Xpert Xpress SARS-CoV-2/FLU/RSV plus assay is  intended as an aid in the diagnosis of influenza from Nasopharyngeal swab specimens and should not be used as a sole basis for treatment. Nasal washings and aspirates are unacceptable for Xpert Xpress SARS-CoV-2/FLU/RSV testing.  Fact Sheet for Patients: EntrepreneurPulse.com.au  Fact Sheet for Healthcare Providers: IncredibleEmployment.be  This test is not yet approved or cleared by the [place] FDA and has been authorized for detection and/or diagnosis of SARS-CoV-2 by FDA under an Emergency Use Authorization (EUA). This EUA will remain in effect (meaning this test can be used) for the duration of the COVID-19 declaration under Section 564(b)(1) of the Act, 21 U.S.C. section 360bbb-3(b)(1), unless the authorization is terminated or revoked.  Performed at [facility], [place] [address] [place], [place] [address]   MRSA Next Gen by PCR, Nasal     Status: None   Collection Time: 11/24/20  3:51 AM   Specimen: Nasal Swab  Result Value Ref Range Status   MRSA by PCR Next Gen NOT DETECTED NOT DETECTED Final    Comment: (NOTE) The GeneXpert MRSA Assay (FDA approved for NASAL specimens only), is one component of a comprehensive MRSA colonization surveillance program. It is not intended to diagnose MRSA infection nor to guide or monitor treatment for MRSA infections. Test performance is not FDA approved in patients less than 53 years old. Performed at [facility], [place] [address], [place], [place] [address]   Resp Panel by RT-PCR (Flu A&B, Covid) Nasopharyngeal Swab     Status: None   Collection Time: 11/24/20  4:16 AM   Specimen: Nasopharyngeal Swab; Nasopharyngeal(NP) swabs in vial transport medium  Result Value Ref Range Status   SARS Coronavirus 2 by RT PCR NEGATIVE NEGATIVE Final    Comment: (NOTE) SARS-CoV-2 target nucleic acids are NOT DETECTED.  The SARS-CoV-2 RNA is generally detectable in upper respiratory specimens  during the acute phase of infection. The lowest concentration of SARS-CoV-2 viral copies this assay can detect is 138 copies/mL. A negative result does not preclude SARS-Cov-2 infection and should not be used as the sole basis for treatment or other patient management decisions. A negative result may occur with  improper specimen collection/handling, submission of specimen other than nasopharyngeal swab, presence of viral mutation(s) within the areas targeted by this assay, and inadequate number of viral copies(<138 copies/mL). A negative result must be combined with clinical observations, patient history, and epidemiological information. The expected result is Negative.  Fact Sheet for Patients:  EntrepreneurPulse.com.au  Fact Sheet for Healthcare Providers:  IncredibleEmployment.be  This test is no t yet approved or cleared by the [place] FDA and  has been authorized for detection and/or diagnosis of SARS-CoV-2 by FDA under an Emergency Use Authorization (EUA). This EUA will remain  in effect (meaning this test can be used) for the duration of the COVID-19 declaration under Section 564(b)(1) of the Act, 21 U.S.C.section 360bbb-3(b)(1), unless the authorization is terminated  or revoked sooner.       Influenza A by PCR NEGATIVE  NEGATIVE Final   Influenza B by PCR NEGATIVE NEGATIVE Final    Comment: (NOTE) The Xpert Xpress SARS-CoV-2/FLU/RSV plus assay is intended as an aid in the diagnosis of influenza from Nasopharyngeal swab specimens and should not be used as a sole basis for treatment. Nasal washings and aspirates are unacceptable for Xpert Xpress SARS-CoV-2/FLU/RSV testing.  Fact Sheet for Patients: EntrepreneurPulse.com.au  Fact Sheet for Healthcare Providers: IncredibleEmployment.be  This test is not yet approved or cleared by the [place] FDA and has been authorized for detection  and/or diagnosis of SARS-CoV-2 by FDA under an Emergency Use Authorization (EUA). This EUA will remain in effect (meaning this test can be used) for the duration of the COVID-19 declaration under Section 564(b)(1) of the Act, 21 U.S.C. section 360bbb-3(b)(1), unless the authorization is terminated or revoked.  Performed at [facility], [place] [address], [place], [place] [address]     RADIOLOGY STUDIES/RESULTS: No results found.   LOS: 2 days   Oren Binet, MD  [facility]   To contact the attending provider between 7A-7P or the covering provider during after hours 7P-7A, please log into the web site [email or web address] and access using universal [place] password for that web site. If you do not have the password, please call the hospital operator.  11/26/2020, 11:44 AM

## 2020-11-26 NOTE — Progress Notes (Signed)
Pt not placed on BiPAP. Pt is resting comfortable on 5L [place] and Sat 96%. RT will cont to monitor

## 2020-11-26 NOTE — Progress Notes (Signed)
Pt. Called out stating he feels short of breath, into room to assess pt. Noted pt appeared very anxious, dyspnea at rest. DuoNEb given, CWIA reassess and PRN given. Respiratory called. MD made aware.

## 2020-11-27 DIAGNOSIS — F10231 Alcohol dependence with withdrawal delirium: Secondary | ICD-10-CM

## 2020-11-27 LAB — GLUCOSE, CAPILLARY
Glucose-Capillary: 177 mg/dL — ABNORMAL HIGH (ref 70–99)
Glucose-Capillary: 187 mg/dL — ABNORMAL HIGH (ref 70–99)
Glucose-Capillary: 186 mg/dL — ABNORMAL HIGH (ref 70–99)
Glucose-Capillary: 148 mg/dL — ABNORMAL HIGH (ref 70–99)
Glucose-Capillary: 114 mg/dL — ABNORMAL HIGH (ref 70–99)

## 2020-11-27 LAB — BASIC METABOLIC PANEL
Anion gap: 4 — ABNORMAL LOW (ref 5–15)
BUN: 22 mg/dL — ABNORMAL HIGH (ref 6–20)
CO2: 39 mmol/L — ABNORMAL HIGH (ref 22–32)
Calcium: 8.8 mg/dL — ABNORMAL LOW (ref 8.9–10.3)
Chloride: 96 mmol/L — ABNORMAL LOW (ref 98–111)
Creatinine, Ser: 0.81 mg/dL (ref 0.61–1.24)
GFR, Estimated: >60 mL/min (ref >60)
Glucose, Bld: 180 mg/dL — ABNORMAL HIGH (ref 70–99)
Potassium: 4.9 mmol/L (ref 3.5–5.1)
Sodium: 139 mmol/L (ref 135–145)

## 2020-11-27 LAB — PHOSPHORUS: Phosphorus: 2.7 mg/dL (ref 2.5–4.6)

## 2020-11-27 MED ORDER — FOLIC ACID 5 MG/ML IJ SOLN
1.0000 mg | Freq: Every day | INTRAMUSCULAR | Status: DC
  Administered 2020-11-28: 1 mg via INTRAVENOUS
  Filled 2020-11-27 (×3): qty 0.2

## 2020-11-27 MED ORDER — INSULIN ASPART 100 UNIT/ML IJ SOLN
0.0000 [IU] | INTRAMUSCULAR | Status: DC
  Administered 2020-11-27: 3 [IU] via SUBCUTANEOUS
  Administered 2020-11-27 – 2020-11-28 (×4): 4 [IU] via SUBCUTANEOUS
  Administered 2020-11-28: 11 [IU] via SUBCUTANEOUS
  Administered 2020-11-28: 3 [IU] via SUBCUTANEOUS
  Administered 2020-11-28: 4 [IU] via SUBCUTANEOUS

## 2020-11-27 MED ORDER — METHYLPREDNISOLONE SODIUM SUCC 40 MG IJ SOLR
20.0000 mg | Freq: Two times a day (BID) | INTRAMUSCULAR | Status: DC
  Administered 2020-11-27 – 2020-11-28 (×2): 20 mg via INTRAVENOUS
  Filled 2020-11-27 (×3): qty 0.5

## 2020-11-27 MED ORDER — REVEFENACIN 175 MCG/3ML IN SOLN
175.0000 ug | Freq: Every day | RESPIRATORY_TRACT | Status: DC
  Administered 2020-11-27 – 2020-11-28 (×2): 175 ug via RESPIRATORY_TRACT
  Filled 2020-11-27 (×3): qty 3

## 2020-11-27 MED ORDER — LORAZEPAM 2 MG/ML IJ SOLN
1.0000 mg | INTRAMUSCULAR | Status: DC | PRN
  Administered 2020-11-27 – 2020-11-28 (×4): 2 mg via INTRAVENOUS
  Filled 2020-11-27 (×4): qty 1

## 2020-11-27 MED ORDER — PANTOPRAZOLE SODIUM 40 MG IV SOLR
40.0000 mg | INTRAVENOUS | Status: DC
  Administered 2020-11-27: 40 mg via INTRAVENOUS
  Filled 2020-11-27: qty 40

## 2020-11-27 NOTE — Progress Notes (Signed)
This is a progress note     NAME:  [name], MRN:  [id number], DOB:  Nov 13, 1965, LOS: 3 ADMISSION DATE:  11/24/2020, CONSULTATION DATE:  11/24/2020  REFERRING MD:  Dr. Lorin Mercy, CHIEF COMPLAINT:  sob   History of Present Illness:  55 yo male smoker with ETOH and polysubstance abuse presented with dyspnea from presumed COPD exacerbation.  Developed alcohol withdrawal and started on precedex.  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  Remains on precedex.  Objective   Blood pressure (!) 155/111, pulse (!) 46, temperature 97.6 F (36.4 C), temperature source Axillary, resp. rate (!) 21, height 5\' 6"  (1.676 m), weight 99.8 kg, SpO2 99 %.    FiO2 (%):  [30 %] 30 %   Intake/Output Summary (Last 24 hours) at 11/27/2020 1051 Last data filed at 11/27/2020 1000 Gross per 24 hour  Intake 1063.38 ml  Output 2951 ml  Net -1887.62 ml   Filed Weights   11/24/20 0757 11/27/20 0339  Weight: 100.2 kg 99.8 kg    Examination:  General - sedated Eyes - pupils reactive ENT - no sinus tenderness, no stridor Cardiac - regular rate/rhythm, no murmur Chest - equal breath sounds b/l, no wheezing or rales Abdomen - soft, non tender, + bowel sounds Extremities - no cyanosis, clubbing, or edema Skin - no rashes Neuro - RASS -2  Resolved Hospital Problem list     Assessment & Plan:   Delirium tremens. - polysubstance withdrawal - continue precedex for RASS goal 0 to -1 - prn ativan for CIWA > 8 - thiamine, folic acid  Acute on  chronic hypoxic/hypercapnic respiratory failure from COPD exacerbation with continued tobacco abuse. Hx of OSA. - pulmicort, yupelri, brovana - prn albuterol - solumedrol 20 mg bid - goal SpO2 90 to 95% - Bipap prn - nicotine patch  Hx of HTN, Chronic diastolic CHF. - hold lisinopril, lasix  DM type 2 with steroid induced hyperglycemia. - SSI  Best Practice (right click and "Reselect all SmartList Selections" daily)   Diet/type: NPO DVT prophylaxis: LMWH GI prophylaxis: PPI Glucose control:  SSI Central venous access:  N/A Arterial line:  N/A Foley:  N/A Mobility:  bed rest  Code Status:  full code Last date of multidisciplinary goals of care discussion [+]  Labs:   CMP Latest Ref Rng & Units 11/27/2020 11/26/2020 11/25/2020  Glucose 70 - 99 mg/dL 180(H) 122(H) 187(H)  BUN 6 - 20 mg/dL 22(H) 16 19  Creatinine 0.61 - 1.24 mg/dL 0.81 0.79 0.93  Sodium 135 - 145 mmol/L 139 139 133(L)  Potassium 3.5 - 5.1 mmol/L 4.9 3.2(L) 3.7  Chloride 98 - 111 mmol/L 96(L) 98 92(L)  CO2 22 - 32 mmol/L 39(H) 36(H) 35(H)  Calcium 8.9 - 10.3 mg/dL 8.8(L) 8.6(L) 8.4(L)  Total Protein 6.5 - 8.1 g/dL - - 6.3(L)  Total Bilirubin 0.3 - 1.2 mg/dL - - 0.6  Alkaline Phos 38 - 126 U/L - - 58  AST 15 - 41 U/L - - 26  ALT 0 - 44 U/L - - 31    CBC Latest Ref  Rng & Units 11/26/2020 11/25/2020 11/24/2020  WBC 4.0 - 10.5 K/uL 9.1 9.5 -  Hemoglobin 13.0 - 17.0 g/dL 14.6 15.0 16.0  Hematocrit 39.0 - 52.0 % 43.6 44.8 47.0  Platelets 150 - 400 K/uL 167 194 -    ABG    Component Value Date/Time   PHART 7.285 (L) 11/24/2020 1500   PCO2ART 87.3 (HH) 11/24/2020 1500   PO2ART 108 11/24/2020 1500   HCO3 41.7 (H) 11/24/2020 1500   TCO2 44 (H) 11/24/2020 1500   O2SAT 97.0 11/24/2020 1500    CBG (last 3)  Recent Labs    11/26/20 1638 11/26/20 2332 11/27/20 0758  GLUCAP 256* 199* 177*    Critical care time: 39 minutes  Chesley Mires, MD [place] Pager - [phone] - [phone] 11/27/2020,  11:01 AM

## 2020-11-27 NOTE — Progress Notes (Signed)
Pt cont to refuse BiPAP. Currently sat 94 on 4L [place] and resting comfortably. RT will cont to monitor.

## 2020-11-28 DIAGNOSIS — Z03818 Encounter for observation for suspected exposure to other biological agents ruled out: Secondary | ICD-10-CM | POA: Diagnosis not present

## 2020-11-28 LAB — CBC
HCT: 53.6 % — ABNORMAL HIGH (ref 39.0–52.0)
Hemoglobin: 18.4 g/dL — ABNORMAL HIGH (ref 13.0–17.0)
MCH: 35.7 pg — ABNORMAL HIGH (ref 26.0–34.0)
MCHC: 34.3 g/dL (ref 30.0–36.0)
MCV: 104.1 fL — ABNORMAL HIGH (ref 80.0–100.0)
Platelets: 171 10*3/uL (ref 150–400)
RBC: 5.15 MIL/uL (ref 4.22–5.81)
RDW: 13.7 % (ref 11.5–15.5)
WBC: 8.5 10*3/uL (ref 4.0–10.5)
nRBC: 0 % (ref 0.0–0.2)

## 2020-11-28 LAB — GLUCOSE, CAPILLARY
Glucose-Capillary: 164 mg/dL — ABNORMAL HIGH (ref 70–99)
Glucose-Capillary: 174 mg/dL — ABNORMAL HIGH (ref 70–99)
Glucose-Capillary: 142 mg/dL — ABNORMAL HIGH (ref 70–99)
Glucose-Capillary: 184 mg/dL — ABNORMAL HIGH (ref 70–99)
Glucose-Capillary: 261 mg/dL — ABNORMAL HIGH (ref 70–99)

## 2020-11-28 LAB — BASIC METABOLIC PANEL
Anion gap: 12 (ref 5–15)
BUN: 23 mg/dL — ABNORMAL HIGH (ref 6–20)
CO2: 29 mmol/L (ref 22–32)
Calcium: 9.2 mg/dL (ref 8.9–10.3)
Chloride: 98 mmol/L (ref 98–111)
Creatinine, Ser: 0.82 mg/dL (ref 0.61–1.24)
GFR, Estimated: >60 mL/min (ref >60)
Glucose, Bld: 160 mg/dL — ABNORMAL HIGH (ref 70–99)
Potassium: 4.1 mmol/L (ref 3.5–5.1)
Sodium: 139 mmol/L (ref 135–145)

## 2020-11-28 LAB — MAGNESIUM: Magnesium: 1.8 mg/dL (ref 1.7–2.4)

## 2020-11-28 LAB — PHOSPHORUS: Phosphorus: 3.9 mg/dL (ref 2.5–4.6)

## 2020-11-28 MED ORDER — MAGNESIUM SULFATE 2 GM/50ML IV SOLN
2.0000 g | Freq: Once | INTRAVENOUS | Status: AC
  Administered 2020-11-28: 2 g via INTRAVENOUS
  Filled 2020-11-28: qty 50

## 2020-11-28 MED ORDER — QUETIAPINE FUMARATE 50 MG PO TABS
50.0000 mg | ORAL_TABLET | Freq: Once | ORAL | Status: AC
  Administered 2020-11-28: 50 mg via ORAL
  Filled 2020-11-28: qty 1

## 2020-11-28 MED ORDER — QUETIAPINE FUMARATE 100 MG PO TABS: 100.0000 mg | ORAL_TABLET | Freq: Every day | ORAL | Status: DC

## 2020-11-28 MED ORDER — DIAZEPAM 5 MG/ML IJ SOLN
10.0000 mg | INTRAMUSCULAR | Status: DC | PRN
  Administered 2020-11-28 (×2): 10 mg via INTRAVENOUS
  Filled 2020-11-28 (×2): qty 2

## 2020-11-28 MED ORDER — QUETIAPINE FUMARATE 50 MG PO TABS: 50.0000 mg | ORAL_TABLET | Freq: Every day | ORAL | Status: DC

## 2020-11-28 MED ORDER — SODIUM CHLORIDE 0.9 % IV SOLN
INTRAVENOUS | Status: DC | PRN
  Administered 2020-11-28: 250 mL via INTRAVENOUS

## 2020-11-28 MED ORDER — DIAZEPAM 5 MG/ML IJ SOLN
10.0000 mg | Freq: Once | INTRAMUSCULAR | Status: AC
  Administered 2020-11-28: 10 mg via INTRAVENOUS
  Filled 2020-11-28: qty 2

## 2020-11-28 MED ORDER — CHLORHEXIDINE GLUCONATE CLOTH 2 % EX PADS: 6.0000 | MEDICATED_PAD | Freq: Every day | CUTANEOUS | Status: DC

## 2020-11-28 NOTE — Progress Notes (Signed)
Patient still requesting to leave AMA, elink notified. AMA form signed and witnessed by this RN. PIV's removed. Patient given all personal belongings as well as paper scrubs and shoes to wear. Patient wheeled out to Entrance A and is awaiting a [employer] taxi that he called himself.

## 2020-11-28 NOTE — Progress Notes (Signed)
eLink Physician-Brief Progress Note Patient Name: Kirtan Sada DOB: 1965-08-31 MRN: [id number]   Date of Service  11/28/2020  HPI/Events of Note  Patient threatening to leave AMA.  eICU Interventions  Patient persuaded to continue with hospitalization.        Kerry Kass Marylen Zuk 11/28/2020, 9:52 PM

## 2020-11-28 NOTE — Progress Notes (Signed)
[facility] ADULT ICU REPLACEMENT PROTOCOL   The patient does apply for the [facility] Adult ICU Electrolyte Replacment Protocol based on the criteria listed below:   1. Is GFR >/= 30 ml/min? Yes.    Patient's GFR today is >60 2. Is SCr </= 2? Yes.   Patient's SCr is 0.82 ml/kg/hr 3. Did SCr increase >/= 0.5 in 24 hours? No. 4. Abnormal electrolyte(s): Mag 1.8. Ordered repletion with: protocol 6. If a panic level lab has been reported, has the CCM MD in charge been notified? Yes.  .   Physician:  Dr Erling Cruz, [place] Grumbling 11/28/2020 5:07 AM

## 2020-11-28 NOTE — Progress Notes (Signed)
Pt stating he wants to leave AMA. RN notified patient to let her know and she will get the appropriate documents in order. [place] notified. Nursing will continue to monitor.

## 2020-11-28 NOTE — Progress Notes (Signed)
NAME:  [name], MRN:  [id number], DOB:  [birth year], LOS: 4 ADMISSION DATE:  11/24/2020, CONSULTATION DATE:  11/24/2020  REFERRING MD:  Dr. Lorin Mercy, CHIEF COMPLAINT:  sob   History of Present Illness:  55 yo male smoker with ETOH and polysubstance abuse presented with dyspnea from presumed COPD exacerbation.  Developed alcohol withdrawal and started on precedex.  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 D/c steroids.  Interim History / Subjective:  On . Drowsy but agitated when aroused.   Objective   Blood pressure (!) 151/87, pulse (!) 57, temperature 97.9 F (36.6 C), temperature source Axillary, resp. rate (!) 28, height 5\' 6"  (1.676 m), weight 96.1 kg, SpO2 90 %.    FiO2 (%):  [30 %] 30 %   Intake/Output Summary (Last 24 hours) at 11/28/2020 0734 Last data filed at 11/28/2020 0600 Gross per 24 hour  Intake 827.9 ml  Output 1076 ml  Net -248.1 ml   Filed Weights   11/24/20 0757 11/27/20 0339 11/28/20 0444  Weight: 100.2 kg 99.8 kg 96.1 kg    Examination: General - sedated but arouses to sternal rub Eyes - Closed, able to open eyes slightly when instructed but drowsy  Cardiac - regular rate/rhythm, no murmur Chest - equal breath sounds b/l, no wheezing or rales Abdomen - obese, soft, + bowel sounds Extremities - No edema  Skin - Warm and dry, no rashes Neuro - RASS -2  Labs/Imaging  Na 139, K 4.1, Gluc 160, Cr 0.82 Hgb 18.4 Resolved Hospital Problem list   Hypokalemia  Assessment & Plan:  Polysubstance Withdrawal with  EtOH, Cocaine  Delirium tremens RASS scores overnight -3 to 2. CIWA overnight ranging 5-10, received 30m Ativan in last 24 hours. Currently on Precedex.  -Wean Precedex -IV Valium x1 to help with Precedex wean -Bedside swallow -Consider switch to Librium today if able to tolerate PO -PRN ativan for CIWA > 8 -Continue thiamine, folic acid -High-risk for leaving AMA once more alert   Acute on chronic hypoxic/hypercapnic respiratory failure from COPD exacerbation with continued tobacco abuse. Hx of OSA. Refusing nightly BiPAP.  - Continue pulmicort, yupelri, brovana - PRN albuterol - Completion of 5 days of steroids, will d/c - D/c PPI for GI ppx since d/c steroids - goal SpO2 greater than 88% - BiPAP as needed  - nicotine patch  Hx of HTN, Chronic diastolic CHF. BP ranging 129-203/86-122 in last 24 hours. Most recently 151/87. On PRN Hydralazine.  -PRN hydralazine  -Consider switch to Amlodipine if able to tolerate PO  DM type 2 with steroid induced hyperglycemia. CBG ranging 114-187. Last Hgb A1c 7.3. 22U SSI in last 24 hours. -moderate SSI -Consider addition of basal insulin  Best Practice (right click and "Reselect all SmartList Selections" daily)   Diet/type: NPO, PO if passes bedside swallow DVT prophylaxis: LMWH GI prophylaxis: N/A, d/c PPI since off steroids Glucose control:  SSI Central venous access:  N/A Arterial line:  N/A Foley:  N/A Mobility:  bed rest  Code Status:  full code Last date of multidisciplinary goals of care discussion [+]  Labs:  CMP Latest Ref Rng & Units 11/28/2020 11/27/2020 11/26/2020  Glucose 70 - 99 mg/dL 160(H) 180(H) 122(H)  BUN 6 - 20 mg/dL 23(H) 22(H) 16  Creatinine 0.61 - 1.24 mg/dL 0.82 0.81 0.79  Sodium 135 - 145 mmol/L 139 139 139  Potassium 3.5 - 5.1 mmol/L 4.1 4.9 3.2(L)  Chloride 98 - 111 mmol/L 98 96(L) 98  CO2 22 - 32 mmol/L 29 39(H) 36(H)  Calcium 8.9 - 10.3 mg/dL 9.2 8.8(L) 8.6(L)  Total Protein 6.5 - 8.1 g/dL - - -   Total Bilirubin 0.3 - 1.2 mg/dL - - -  Alkaline Phos 38 - 126 U/L - - -  AST 15 - 41 U/L - - -  ALT 0 - 44 U/L - - -    CBC Latest Ref Rng & Units 11/28/2020 11/26/2020 11/25/2020  WBC 4.0 - 10.5 K/uL 8.5 9.1 9.5  Hemoglobin 13.0 - 17.0 g/dL 18.4(H) 14.6 15.0  Hematocrit 39.0 - 52.0 % 53.6(H) 43.6 44.8  Platelets 150 - 400 K/uL 171 167 194    ABG    Component Value Date/Time   PHART 7.285 (L) 11/24/2020 1500   PCO2ART 87.3 (HH) 11/24/2020 1500   PO2ART 108 11/24/2020 1500   HCO3 41.7 (H) 11/24/2020 1500   TCO2 44 (H) 11/24/2020 1500   O2SAT 97.0 11/24/2020 1500    CBG (last 3)  Recent Labs    11/27/20 2325 11/28/20 0322 11/28/20 0722  GLUCAP 114* 164* 174*    Critical care time:   Sharion Settler PGY-2 Family Medicine

## 2020-11-29 DIAGNOSIS — J9622 Acute and chronic respiratory failure with hypercapnia: Secondary | ICD-10-CM

## 2020-11-29 NOTE — Discharge Summary (Signed)
Physician Discharge Summary  Patient ID: [name] MRN: [id number] DOB/AGE: 09-18-1965 55 y.o.  Admit date: 11/24/2020 Discharge date: 11/29/2020  Admission Diagnoses: Alcohol Withdrawal and COPD exacerbation  Discharge Diagnoses:  Principal Problem:   Acute on chronic respiratory failure with hypercapnia (HCC) Active Problems:   COPD (chronic obstructive pulmonary disease) (HCC)   Tobacco abuse   Essential hypertension   Polysubstance abuse (HCC)   Chronic diastolic CHF (congestive heart failure) (HCC)   Uncontrolled diabetes mellitus (HCC)   Class 2 obesity due to excess calories with body mass index (BMI) of 35.0 to 35.9 in adult   Acute encephalopathy   Discharged Condition: poor  Hospital Course:  Patient admitted on 7/5 for alcohol abuse and polysubstance abuse who was admitted for alcohol withdrawal and acute encephalopathy with respiratory failure requiring intermittent bipap therapy. He was initially admitted to the general medical floor then transferred to the ICU for precedex therapy. He was transitioned to long acting benzodiazepines and scheduled seroquel and able to come off the precedex. Overnight he was persistent about signing out AMA and signed papers and called a cab for a ride. He was advised not to leave to finish his therapy for alcohol withdrawal. He was warned that leaving could significantly cause harm or death.   Consults: None  Significant Diagnostic Studies:  CT Head and C-Spine 11/22/20 CXR 11/24/20  Treatments:  As above in hospital course.   Discharge Exam: Blood pressure 120/70, pulse (!) 106, temperature (!) 97.3 F (36.3 C), temperature source Axillary, resp. rate (!) 41, height 5\' 6"  (1.676 m), weight 96.1 kg, SpO2 94 %.  No exam performed as he signed out overnight.  Disposition:  Patient signed out against medical advice. He called a cab to go home.  Allergies as of 11/29/2020       Reactions   Penicillins    Has tolerated cefepime  5/22 From childhood: Has patient had a PCN reaction causing immediate rash, facial/tongue/throat swelling, SOB or lightheadedness with hypotension: Unknown Has patient had a PCN reaction causing severe rash involving mucus membranes or skin necrosis: Unknown Has patient had a PCN reaction that required hospitalization: Unknown Has patient had a PCN reaction occurring within the last 10 years: No If all of the above answers are "NO", then may proceed with Cephalosporin use.        Medication List     ASK your doctor about these medications    albuterol 108 (90 Base) MCG/ACT inhaler Commonly known as: VENTOLIN HFA Inhale 2 puffs into the lungs every 4 (four) hours as needed for wheezing or shortness of breath (cough).   budesonide-formoterol 160-4.5 MCG/ACT inhaler Commonly known as: Symbicort Inhale 2 puffs into the lungs in the morning and at bedtime.   folic acid 1 MG tablet Commonly known as: FOLVITE Take 1 tablet (1 mg total) by mouth daily.   furosemide 40 MG tablet Commonly known as: LASIX Take 1 tablet (40 mg total) by mouth 2 (two) times daily.   lisinopril 40 MG tablet Commonly known as: ZESTRIL Take 1 tablet (40 mg total) by mouth daily.   metFORMIN 500 MG tablet Commonly known as: Glucophage Take 1 tablet (500 mg total) by mouth 2 (two) times daily with a meal.   naloxone 4 MG/0.1ML Liqd nasal spray kit Commonly known as: NARCAN Use as directed   nicotine 7 mg/24hr patch Commonly known as: NICODERM CQ - dosed in mg/24 hr Place 1 patch (7 mg total) onto the skin daily.  Signed: Freddi Starr 11/29/2020, 5:04 PM

## 2020-11-30 ENCOUNTER — Telehealth: Payer: Self-pay

## 2020-11-30 NOTE — Telephone Encounter (Signed)
Transition Care Management Unsuccessful Follow-up Telephone Call  Date of discharge and from where:  11/28/2020, [facility] - left AMA  Attempts:  1st Attempt  Reason for unsuccessful TCM follow-up call:  Left voice message with patient's mother [phone].  She said she will have him call this CM if she hears from him.  She has not heard from him since he left the hospital and she does not know where he is.  Call placed to # [phone] and it just rings fast busy.    Needs to schedule hospital follow up appointment with PCP

## 2020-12-01 ENCOUNTER — Telehealth: Payer: Self-pay

## 2020-12-01 NOTE — Telephone Encounter (Signed)
Transition Care Management Unsuccessful Follow-up Telephone Call  Date of discharge and from where:  11/29/2020, [facility] - left AMA  Attempts:  2nd Attempt  Reason for unsuccessful TCM follow-up call:  Left voice message  with his mother she said she forgot to tell him to call this CM.  She just spoke to him this morning and he requested money for a place to stay but she does not have the funds.  She said he has been staying in his car and has no where to stay.  She was requesting assistance.  Provided her with the phone numbers for [facility] and [facility].  Call placed to patient's phone # [phone] and it rings fast busy.  Need to schedule hospital follow up appointment with PCP

## 2020-12-02 ENCOUNTER — Telehealth: Payer: Self-pay

## 2020-12-02 NOTE — Telephone Encounter (Signed)
Transition Care Management Unsuccessful Follow-up Telephone Call   Date of discharge and from where:  11/29/2020, [facility] - left AMA   Attempts:  3 rd Attempt   Reason for unsuccessful TCM follow-up call:  Left voice message  with his mother she said she forgot to will really message if she will see him.  She said he has been staying in his car and has no where to stay.      Need to schedule hospital follow up appointment with PCP

## 2020-12-05 DIAGNOSIS — Z03818 Encounter for observation for suspected exposure to other biological agents ruled out: Secondary | ICD-10-CM | POA: Diagnosis not present

## 2020-12-12 DIAGNOSIS — Z03818 Encounter for observation for suspected exposure to other biological agents ruled out: Secondary | ICD-10-CM | POA: Diagnosis not present

## 2020-12-19 DIAGNOSIS — Z03818 Encounter for observation for suspected exposure to other biological agents ruled out: Secondary | ICD-10-CM | POA: Diagnosis not present

## 2020-12-26 DIAGNOSIS — Z03818 Encounter for observation for suspected exposure to other biological agents ruled out: Secondary | ICD-10-CM | POA: Diagnosis not present

## 2021-01-02 DIAGNOSIS — Z03818 Encounter for observation for suspected exposure to other biological agents ruled out: Secondary | ICD-10-CM | POA: Diagnosis not present

## 2021-01-09 DIAGNOSIS — Z03818 Encounter for observation for suspected exposure to other biological agents ruled out: Secondary | ICD-10-CM | POA: Diagnosis not present

## 2021-01-16 DIAGNOSIS — Z03818 Encounter for observation for suspected exposure to other biological agents ruled out: Secondary | ICD-10-CM | POA: Diagnosis not present

## 2021-01-24 DIAGNOSIS — Z03818 Encounter for observation for suspected exposure to other biological agents ruled out: Secondary | ICD-10-CM | POA: Diagnosis not present

## 2021-01-30 DIAGNOSIS — Z03818 Encounter for observation for suspected exposure to other biological agents ruled out: Secondary | ICD-10-CM | POA: Diagnosis not present

## 2021-02-06 DIAGNOSIS — Z03818 Encounter for observation for suspected exposure to other biological agents ruled out: Secondary | ICD-10-CM | POA: Diagnosis not present

## 2021-02-13 DIAGNOSIS — Z03818 Encounter for observation for suspected exposure to other biological agents ruled out: Secondary | ICD-10-CM | POA: Diagnosis not present

## 2021-02-20 DIAGNOSIS — Z03818 Encounter for observation for suspected exposure to other biological agents ruled out: Secondary | ICD-10-CM | POA: Diagnosis not present

## 2021-02-27 DIAGNOSIS — Z03818 Encounter for observation for suspected exposure to other biological agents ruled out: Secondary | ICD-10-CM | POA: Diagnosis not present

## 2021-03-06 DIAGNOSIS — Z03818 Encounter for observation for suspected exposure to other biological agents ruled out: Secondary | ICD-10-CM | POA: Diagnosis not present

## 2021-03-13 DIAGNOSIS — Z03818 Encounter for observation for suspected exposure to other biological agents ruled out: Secondary | ICD-10-CM | POA: Diagnosis not present

## 2021-03-20 DIAGNOSIS — Z03818 Encounter for observation for suspected exposure to other biological agents ruled out: Secondary | ICD-10-CM | POA: Diagnosis not present

## 2021-03-27 DIAGNOSIS — Z03818 Encounter for observation for suspected exposure to other biological agents ruled out: Secondary | ICD-10-CM | POA: Diagnosis not present

## 2021-03-28 ENCOUNTER — Emergency Department (HOSPITAL_COMMUNITY): Payer: Medicaid Other

## 2021-03-28 ENCOUNTER — Inpatient Hospital Stay (HOSPITAL_COMMUNITY)
Admission: EM | Admit: 2021-03-28 | Discharge: 2021-03-30 | DRG: 190 | Disposition: A | Discharge status: Home / Self Care | Payer: Medicaid Other | Attending: Family Medicine | Admitting: Family Medicine

## 2021-03-28 ENCOUNTER — Other Ambulatory Visit: Payer: Self-pay

## 2021-03-28 ENCOUNTER — Encounter (HOSPITAL_COMMUNITY): Payer: Self-pay | Admitting: Emergency Medicine

## 2021-03-28 DIAGNOSIS — J9602 Acute respiratory failure with hypercapnia: Secondary | ICD-10-CM | POA: Diagnosis present

## 2021-03-28 DIAGNOSIS — I10 Essential (primary) hypertension: Secondary | ICD-10-CM

## 2021-03-28 DIAGNOSIS — R0602 Shortness of breath: Secondary | ICD-10-CM | POA: Diagnosis not present

## 2021-03-28 DIAGNOSIS — J441 Chronic obstructive pulmonary disease with (acute) exacerbation: Secondary | ICD-10-CM | POA: Diagnosis not present

## 2021-03-28 DIAGNOSIS — F1410 Cocaine abuse, uncomplicated: Secondary | ICD-10-CM | POA: Diagnosis present

## 2021-03-28 DIAGNOSIS — F1990 Other psychoactive substance use, unspecified, uncomplicated: Secondary | ICD-10-CM | POA: Diagnosis not present

## 2021-03-28 DIAGNOSIS — Z8701 Personal history of pneumonia (recurrent): Secondary | ICD-10-CM | POA: Diagnosis not present

## 2021-03-28 DIAGNOSIS — Z8616 Personal history of COVID-19: Secondary | ICD-10-CM | POA: Diagnosis not present

## 2021-03-28 DIAGNOSIS — E669 Obesity, unspecified: Secondary | ICD-10-CM | POA: Diagnosis present

## 2021-03-28 DIAGNOSIS — I517 Cardiomegaly: Secondary | ICD-10-CM | POA: Diagnosis not present

## 2021-03-28 DIAGNOSIS — Z7984 Long term (current) use of oral hypoglycemic drugs: Secondary | ICD-10-CM

## 2021-03-28 DIAGNOSIS — Z7951 Long term (current) use of inhaled steroids: Secondary | ICD-10-CM

## 2021-03-28 DIAGNOSIS — Z720 Tobacco use: Secondary | ICD-10-CM | POA: Diagnosis not present

## 2021-03-28 DIAGNOSIS — G4733 Obstructive sleep apnea (adult) (pediatric): Secondary | ICD-10-CM | POA: Diagnosis present

## 2021-03-28 DIAGNOSIS — E1169 Type 2 diabetes mellitus with other specified complication: Secondary | ICD-10-CM | POA: Diagnosis not present

## 2021-03-28 DIAGNOSIS — Z5900 Homelessness unspecified: Secondary | ICD-10-CM | POA: Diagnosis not present

## 2021-03-28 DIAGNOSIS — I5032 Chronic diastolic (congestive) heart failure: Secondary | ICD-10-CM | POA: Diagnosis present

## 2021-03-28 DIAGNOSIS — E7220 Disorder of urea cycle metabolism, unspecified: Secondary | ICD-10-CM | POA: Diagnosis present

## 2021-03-28 DIAGNOSIS — I110 Hypertensive heart disease with heart failure: Secondary | ICD-10-CM | POA: Diagnosis present

## 2021-03-28 DIAGNOSIS — Z809 Family history of malignant neoplasm, unspecified: Secondary | ICD-10-CM

## 2021-03-28 DIAGNOSIS — E119 Type 2 diabetes mellitus without complications: Secondary | ICD-10-CM | POA: Diagnosis present

## 2021-03-28 DIAGNOSIS — M545 Low back pain, unspecified: Secondary | ICD-10-CM | POA: Diagnosis present

## 2021-03-28 DIAGNOSIS — F1190 Opioid use, unspecified, uncomplicated: Secondary | ICD-10-CM | POA: Diagnosis present

## 2021-03-28 DIAGNOSIS — F1010 Alcohol abuse, uncomplicated: Secondary | ICD-10-CM | POA: Diagnosis not present

## 2021-03-28 DIAGNOSIS — Z20822 Contact with and (suspected) exposure to covid-19: Secondary | ICD-10-CM | POA: Diagnosis present

## 2021-03-28 DIAGNOSIS — G8929 Other chronic pain: Secondary | ICD-10-CM | POA: Diagnosis present

## 2021-03-28 DIAGNOSIS — Z8249 Family history of ischemic heart disease and other diseases of the circulatory system: Secondary | ICD-10-CM | POA: Diagnosis not present

## 2021-03-28 DIAGNOSIS — Z5320 Procedure and treatment not carried out because of patient's decision for unspecified reasons: Secondary | ICD-10-CM | POA: Diagnosis present

## 2021-03-28 DIAGNOSIS — Z823 Family history of stroke: Secondary | ICD-10-CM | POA: Diagnosis not present

## 2021-03-28 DIAGNOSIS — Z6835 Body mass index (BMI) 35.0-35.9, adult: Secondary | ICD-10-CM

## 2021-03-28 DIAGNOSIS — F17210 Nicotine dependence, cigarettes, uncomplicated: Secondary | ICD-10-CM | POA: Diagnosis present

## 2021-03-28 DIAGNOSIS — Z880 Allergy status to penicillin: Secondary | ICD-10-CM

## 2021-03-28 DIAGNOSIS — J9600 Acute respiratory failure, unspecified whether with hypoxia or hypercapnia: Secondary | ICD-10-CM

## 2021-03-28 DIAGNOSIS — Z79899 Other long term (current) drug therapy: Secondary | ICD-10-CM

## 2021-03-28 LAB — AMMONIA: Ammonia: 71 umol/L — ABNORMAL HIGH (ref 9–35)

## 2021-03-28 LAB — I-STAT VENOUS BLOOD GAS, ED
Acid-Base Excess: 4 mmol/L — ABNORMAL HIGH (ref 0.0–2.0)
Bicarbonate: 32 mmol/L — ABNORMAL HIGH (ref 20.0–28.0)
Calcium, Ion: 1.17 mmol/L (ref 1.15–1.40)
HCT: 46 % (ref 39.0–52.0)
Hemoglobin: 15.6 g/dL (ref 13.0–17.0)
O2 Saturation: 95 %
Potassium: 3.6 mmol/L (ref 3.5–5.1)
Sodium: 141 mmol/L (ref 135–145)
TCO2: 34 mmol/L — ABNORMAL HIGH (ref 22–32)
pCO2, Ven: 63.3 mmHg — ABNORMAL HIGH (ref 44.0–60.0)
pH, Ven: 7.312 (ref 7.250–7.430)
pO2, Ven: 87 mmHg — ABNORMAL HIGH (ref 32.0–45.0)

## 2021-03-28 LAB — RAPID URINE DRUG SCREEN, HOSP PERFORMED
Amphetamines: NOT DETECTED
Barbiturates: NOT DETECTED
Benzodiazepines: NOT DETECTED
Cocaine: POSITIVE — AB
Opiates: NOT DETECTED
Tetrahydrocannabinol: NOT DETECTED

## 2021-03-28 LAB — COMPREHENSIVE METABOLIC PANEL
ALT: 36 U/L (ref 0–44)
AST: 33 U/L (ref 15–41)
Albumin: 3.7 g/dL (ref 3.5–5.0)
Alkaline Phosphatase: 69 U/L (ref 38–126)
Anion gap: 11 (ref 5–15)
BUN: 11 mg/dL (ref 6–20)
CO2: 25 mmol/L (ref 22–32)
Calcium: 8.9 mg/dL (ref 8.9–10.3)
Chloride: 100 mmol/L (ref 98–111)
Creatinine, Ser: 0.99 mg/dL (ref 0.61–1.24)
GFR, Estimated: >60 mL/min (ref >60)
Glucose, Bld: 279 mg/dL — ABNORMAL HIGH (ref 70–99)
Potassium: 3.8 mmol/L (ref 3.5–5.1)
Sodium: 136 mmol/L (ref 135–145)
Total Bilirubin: 1.1 mg/dL (ref 0.3–1.2)
Total Protein: 7.6 g/dL (ref 6.5–8.1)

## 2021-03-28 LAB — I-STAT ARTERIAL BLOOD GAS, ED
Acid-Base Excess: 0 mmol/L (ref 0.0–2.0)
Bicarbonate: 27.5 mmol/L (ref 20.0–28.0)
Calcium, Ion: 1.21 mmol/L (ref 1.15–1.40)
HCT: 48 % (ref 39.0–52.0)
Hemoglobin: 16.3 g/dL (ref 13.0–17.0)
O2 Saturation: 95 %
Patient temperature: 98.6
Potassium: 3.7 mmol/L (ref 3.5–5.1)
Sodium: 139 mmol/L (ref 135–145)
TCO2: 29 mmol/L (ref 22–32)
pCO2 arterial: 52.1 mmHg — ABNORMAL HIGH (ref 32.0–48.0)
pH, Arterial: 7.331 — ABNORMAL LOW (ref 7.350–7.450)
pO2, Arterial: 81 mmHg — ABNORMAL LOW (ref 83.0–108.0)

## 2021-03-28 LAB — BASIC METABOLIC PANEL
Anion gap: 8 (ref 5–15)
BUN: 13 mg/dL (ref 6–20)
CO2: 27 mmol/L (ref 22–32)
Calcium: 8.7 mg/dL — ABNORMAL LOW (ref 8.9–10.3)
Chloride: 102 mmol/L (ref 98–111)
Creatinine, Ser: 0.84 mg/dL (ref 0.61–1.24)
GFR, Estimated: >60 mL/min (ref >60)
Glucose, Bld: 140 mg/dL — ABNORMAL HIGH (ref 70–99)
Potassium: 3.7 mmol/L (ref 3.5–5.1)
Sodium: 137 mmol/L (ref 135–145)

## 2021-03-28 LAB — HEMOGLOBIN A1C
Hgb A1c MFr Bld: 6.3 % — ABNORMAL HIGH (ref 4.8–5.6)
Mean Plasma Glucose: 134.11 mg/dL

## 2021-03-28 LAB — CBC
HCT: 47.9 % (ref 39.0–52.0)
Hemoglobin: 16 g/dL (ref 13.0–17.0)
MCH: 35.2 pg — ABNORMAL HIGH (ref 26.0–34.0)
MCHC: 33.4 g/dL (ref 30.0–36.0)
MCV: 105.3 fL — ABNORMAL HIGH (ref 80.0–100.0)
Platelets: 166 10*3/uL (ref 150–400)
RBC: 4.55 MIL/uL (ref 4.22–5.81)
RDW: 13.8 % (ref 11.5–15.5)
WBC: 4.2 10*3/uL (ref 4.0–10.5)
nRBC: 0 % (ref 0.0–0.2)

## 2021-03-28 LAB — CBG MONITORING, ED: Glucose-Capillary: 273 mg/dL — ABNORMAL HIGH (ref 70–99)

## 2021-03-28 LAB — RESP PANEL BY RT-PCR (FLU A&B, COVID) ARPGX2
Influenza A by PCR: NEGATIVE
Influenza B by PCR: NEGATIVE
SARS Coronavirus 2 by RT PCR: NEGATIVE

## 2021-03-28 LAB — PROCALCITONIN: Procalcitonin: <0.1 ng/mL

## 2021-03-28 LAB — TROPONIN I (HIGH SENSITIVITY)
Troponin I (High Sensitivity): 27 ng/L — ABNORMAL HIGH (ref <18)
Troponin I (High Sensitivity): 29 ng/L — ABNORMAL HIGH (ref <18)

## 2021-03-28 LAB — BRAIN NATRIURETIC PEPTIDE: B Natriuretic Peptide: 143.6 pg/mL — ABNORMAL HIGH (ref 0.0–100.0)

## 2021-03-28 MED ORDER — INSULIN ASPART 100 UNIT/ML IJ SOLN
0.0000 [IU] | Freq: Three times a day (TID) | INTRAMUSCULAR | Status: DC
  Administered 2021-03-28: 5 [IU] via SUBCUTANEOUS

## 2021-03-28 MED ORDER — IPRATROPIUM-ALBUTEROL 0.5-2.5 (3) MG/3ML IN SOLN
3.0000 mL | RESPIRATORY_TRACT | Status: DC
  Administered 2021-03-28 (×2): 3 mL via RESPIRATORY_TRACT
  Filled 2021-03-28 (×2): qty 3

## 2021-03-28 MED ORDER — METHYLPREDNISOLONE SODIUM SUCC 125 MG IJ SOLR
125.0000 mg | Freq: Once | INTRAMUSCULAR | Status: AC
  Administered 2021-03-28: 125 mg via INTRAVENOUS
  Filled 2021-03-28: qty 2

## 2021-03-28 MED ORDER — IPRATROPIUM-ALBUTEROL 0.5-2.5 (3) MG/3ML IN SOLN
3.0000 mL | Freq: Once | RESPIRATORY_TRACT | Status: AC
  Administered 2021-03-28: 3 mL via RESPIRATORY_TRACT
  Filled 2021-03-28: qty 3

## 2021-03-28 MED ORDER — ADULT MULTIVITAMIN W/MINERALS CH
1.0000 | ORAL_TABLET | Freq: Every day | ORAL | Status: DC
  Administered 2021-03-28 – 2021-03-30 (×3): 1 via ORAL
  Filled 2021-03-28 (×3): qty 1

## 2021-03-28 MED ORDER — ENOXAPARIN SODIUM 40 MG/0.4ML IJ SOSY
40.0000 mg | PREFILLED_SYRINGE | INTRAMUSCULAR | Status: DC
  Administered 2021-03-28: 40 mg via SUBCUTANEOUS
  Filled 2021-03-28 (×2): qty 0.4

## 2021-03-28 MED ORDER — ALBUTEROL SULFATE (2.5 MG/3ML) 0.083% IN NEBU: 10.0000 mg | INHALATION_SOLUTION | RESPIRATORY_TRACT | Status: DC

## 2021-03-28 MED ORDER — ALBUTEROL (5 MG/ML) CONTINUOUS INHALATION SOLN
10.0000 mg/h | INHALATION_SOLUTION | RESPIRATORY_TRACT | Status: DC
  Filled 2021-03-28: qty 0.5
  Filled 2021-03-28: qty 20

## 2021-03-28 MED ORDER — SODIUM CHLORIDE 0.9 % IV SOLN
500.0000 mg | INTRAVENOUS | Status: DC
  Administered 2021-03-28: 500 mg via INTRAVENOUS
  Filled 2021-03-28: qty 500

## 2021-03-28 MED ORDER — IPRATROPIUM-ALBUTEROL 0.5-2.5 (3) MG/3ML IN SOLN
3.0000 mL | RESPIRATORY_TRACT | Status: DC
Start: 2021-03-28 — End: 2021-03-28
  Administered 2021-03-28: 3 mL via RESPIRATORY_TRACT
  Filled 2021-03-28: qty 3

## 2021-03-28 MED ORDER — ALBUTEROL SULFATE (2.5 MG/3ML) 0.083% IN NEBU: 2.5000 mg | INHALATION_SOLUTION | RESPIRATORY_TRACT | Status: DC | PRN

## 2021-03-28 MED ORDER — IPRATROPIUM-ALBUTEROL 0.5-2.5 (3) MG/3ML IN SOLN
3.0000 mL | Freq: Four times a day (QID) | RESPIRATORY_TRACT | Status: DC
Start: 2021-03-29 — End: 2021-03-30
  Administered 2021-03-29 (×3): 3 mL via RESPIRATORY_TRACT
  Filled 2021-03-28 (×5): qty 3

## 2021-03-28 MED ORDER — THIAMINE HCL 100 MG/ML IJ SOLN
100.0000 mg | Freq: Every day | INTRAMUSCULAR | Status: DC
  Administered 2021-03-28: 100 mg via INTRAVENOUS
  Filled 2021-03-28: qty 2

## 2021-03-28 MED ORDER — ALBUTEROL SULFATE (2.5 MG/3ML) 0.083% IN NEBU
INHALATION_SOLUTION | RESPIRATORY_TRACT | Status: AC
  Administered 2021-03-28: 10 mg via RESPIRATORY_TRACT
  Filled 2021-03-28: qty 12

## 2021-03-28 MED ORDER — FOLIC ACID 5 MG/ML IJ SOLN
1.0000 mg | Freq: Every day | INTRAMUSCULAR | Status: DC
  Administered 2021-03-28: 1 mg via INTRAVENOUS
  Filled 2021-03-28 (×2): qty 0.2

## 2021-03-28 NOTE — ED Notes (Signed)
Expressed to pt the need of a urine sample. Pt given urinal. Pt stated he would let us know when the urine sample is provided.

## 2021-03-28 NOTE — Hospital Course (Addendum)
Acute Respiratory Failure  Woke up today from sleep and became short of breath. Has history of CHF and COPD with admission in July of this year for acute encephalopathy with respiratory failure requiring BiPAP therapy and transfer to ICU. EMS called, his home oxygen is 3 L nasal canula but is homeless and has not had access to this. Laying down worsens shortness of breath. He has been feeling very lethargic. Took 1 g of cocaine in past 24 hours. Vital signs in ED showed elevated BP with systolics in [id number]-[id number] which later became normotensive. Has been afebrile. HRs in 50s. Started on 3L nasal canula and transitioned to BiPAP. Labs in ED showed BNP 143. Troponin 27--> 29. VBG pH 7.3. CXR showed cardiomegaly and increased interstitial markings suggesting interstitial pulmonary edema or interstitial pneumonia.There is no focal pulmonary consolidation. Respiratory panel negative for covid and flu. EKG showed sinus rhythm with rate of 55. Last Echo 09/23/2020 which showed EF of 65-70% and some grade I diastolic dysfunction. Received duoneb x 1 and solumedrol 125 mg injection. Was started on continuous albuterol nebs after but refused BiPAP mask. Had diffuse expiratory wheezes with limited air movement throughout. He was intermittently somnolent during exam and would have to wake up again before answering questions. No consolidation on lung exam. Had increased sputum production given "frothy appearing" spit up earlier today. He was given 3 days of azithromycin. He was breathing and saturating well on RA at discharge.    Bradycardia,chronic Noted on previous admission to be in the 40s at rest. HRs have been in 50s while in ED. Could be related to cocaine withdrawal. Electrolytes and HR was monitored.   HTN BPs have been [id number] systolic but have been more normotensive recently. Patient had no home medications. Likely affected by hospitalizations and cocaine use as well.   DM2 A1c 7.3 five months ago. Glucose  stable in hospitalization. Takes no home meds. A1c of 6.3.   Polysubstance abuse ETOH and Smoking use since 55 years old. Snorts and smokes cocaine and uses heroin. Last used cocaine yesterday. Drank EtOH two days ago.  UDS with +cocaine, CIWA protocol continued ,thiamine and folate given. MV given.  Hyperammonemia 71 to 36 ammonia level. Lactulose given. Liver enzymes normal in hospital. Mental status A&Ox4 at d/c. No abdominal pain but distended.
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
## 2021-03-28 NOTE — Progress Notes (Signed)
FPTS Progress note  Received page requesting diet orders from nurse. Spoke with nurse about [name], saying that he is awake, able to remain awake, and is requesting food. Nurse was speaking to patient on the phone and he was clearly heard stating "I'm alert!" Patient appears to be more awake and thus can tolerate a regular diet. Will order regular diet.  Randall Bouche, MD [facility] PGY-1

## 2021-03-28 NOTE — ED Triage Notes (Signed)
Per [facility] pt coming from bus stop reporting shortness of breath onset about an hour ago. States woke him from his sleep. Patient supposed to be on 3L [place] however is homeless. Non compliant with meds.  Patient falling asleep with RN attempting to triage.
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
## 2021-03-28 NOTE — Progress Notes (Signed)
RT arrived to patient room to place patient on bipap however once RT stated to patient that bipap was going to be initiated, patient stated, "If yall put that mask on me I will sign out and leave."  RT proceeded to try to talk patient into wearing bipap however patient still refused.  Patient stated, "Either give me something to drink or I will sign out."  MD came to patient room and decision made to hold on bipap.  Will continue to monitor.

## 2021-03-28 NOTE — ED Notes (Signed)
Patient transported to X-ray 

## 2021-03-28 NOTE — Progress Notes (Signed)
Patient started on continuous Albuterol.  When instructed patient for potential for going on bipap mask, patient stated to RT, "Ain't noone going to put that mask on me."  Will continue to monitor.

## 2021-03-28 NOTE — Progress Notes (Addendum)
I went to evaluate this patient, who was visibly upset and would not let me touch him to examine him. He said he has done this repeatedly and wishes to sign the AMA papers. I counseled him on what could happen if he signed AMA, and he said, " I know, I could die; just give me the papers." No RN was available at this time to discuss  his AMA request with. I have asked the resident to reevaluate and counsel him regarding leaving. Based on the limited time spent with him, he does have great decision-making capacity. If he decides to stay, I have thoroughly reviewed his record and will discuss the management plan with the resident, who has already evaluated him today.

## 2021-03-29 ENCOUNTER — Other Ambulatory Visit (HOSPITAL_COMMUNITY): Payer: Self-pay

## 2021-03-29 ENCOUNTER — Encounter (HOSPITAL_COMMUNITY): Payer: Self-pay | Admitting: Student

## 2021-03-29 DIAGNOSIS — R0602 Shortness of breath: Secondary | ICD-10-CM | POA: Diagnosis not present

## 2021-03-29 DIAGNOSIS — J9600 Acute respiratory failure, unspecified whether with hypoxia or hypercapnia: Secondary | ICD-10-CM | POA: Diagnosis not present

## 2021-03-29 LAB — CBC
HCT: 45.4 % (ref 39.0–52.0)
Hemoglobin: 15.5 g/dL (ref 13.0–17.0)
MCH: 35.9 pg — ABNORMAL HIGH (ref 26.0–34.0)
MCHC: 34.1 g/dL (ref 30.0–36.0)
MCV: 105.1 fL — ABNORMAL HIGH (ref 80.0–100.0)
Platelets: 171 10*3/uL (ref 150–400)
RBC: 4.32 MIL/uL (ref 4.22–5.81)
RDW: 13.6 % (ref 11.5–15.5)
WBC: 11.3 10*3/uL — ABNORMAL HIGH (ref 4.0–10.5)
nRBC: 0 % (ref 0.0–0.2)

## 2021-03-29 LAB — LEGIONELLA PNEUMOPHILA SEROGP 1 UR AG: L. pneumophila Serogp 1 Ur Ag: NEGATIVE

## 2021-03-29 LAB — COMPREHENSIVE METABOLIC PANEL
ALT: 27 U/L (ref 0–44)
AST: 21 U/L (ref 15–41)
Albumin: 3.3 g/dL — ABNORMAL LOW (ref 3.5–5.0)
Alkaline Phosphatase: 61 U/L (ref 38–126)
Anion gap: 9 (ref 5–15)
BUN: 13 mg/dL (ref 6–20)
CO2: 30 mmol/L (ref 22–32)
Calcium: 8.6 mg/dL — ABNORMAL LOW (ref 8.9–10.3)
Chloride: 101 mmol/L (ref 98–111)
Creatinine, Ser: 0.84 mg/dL (ref 0.61–1.24)
GFR, Estimated: >60 mL/min (ref >60)
Glucose, Bld: 150 mg/dL — ABNORMAL HIGH (ref 70–99)
Potassium: 3.8 mmol/L (ref 3.5–5.1)
Sodium: 140 mmol/L (ref 135–145)
Total Bilirubin: 0.7 mg/dL (ref 0.3–1.2)
Total Protein: 6.6 g/dL (ref 6.5–8.1)

## 2021-03-29 MED ORDER — FOLIC ACID 1 MG PO TABS
1.0000 mg | ORAL_TABLET | Freq: Every day | ORAL | Status: DC
  Administered 2021-03-29 – 2021-03-30 (×2): 1 mg via ORAL
  Filled 2021-03-29 (×2): qty 1

## 2021-03-29 MED ORDER — SPIRIVA HANDIHALER 18 MCG IN CAPS
18.0000 ug | ORAL_CAPSULE | Freq: Every day | RESPIRATORY_TRACT | 2 refills | Status: DC
  Filled 2021-03-29: qty 30, 30d supply, fill #0

## 2021-03-29 MED ORDER — LACTULOSE 10 GM/15ML PO SOLN
20.0000 g | Freq: Two times a day (BID) | ORAL | Status: DC
  Administered 2021-03-30: 20 g via ORAL
  Filled 2021-03-29: qty 30

## 2021-03-29 MED ORDER — THIAMINE HCL 100 MG PO TABS
100.0000 mg | ORAL_TABLET | Freq: Every day | ORAL | Status: DC
  Administered 2021-03-29 – 2021-03-30 (×2): 100 mg via ORAL
  Filled 2021-03-29 (×2): qty 1

## 2021-03-29 MED ORDER — AZITHROMYCIN 250 MG PO TABS
500.0000 mg | ORAL_TABLET | Freq: Every evening | ORAL | Status: DC
  Administered 2021-03-29: 500 mg via ORAL
  Filled 2021-03-29: qty 2

## 2021-03-29 NOTE — Progress Notes (Signed)
Pt has refused Bi-PAP tonight stating that he doesn't even want to talk about it. Pt vitals are stable and pt doesn't seem to be in any distress. RT will monitor as needed.

## 2021-03-29 NOTE — Progress Notes (Signed)
Pt [name] back to sleep after eating his sandwich bread. RN notified MD that the pt refuse to be back on tele.

## 2021-03-29 NOTE — Progress Notes (Signed)
Called RN to see how patient was at this time. She reports he is sleeping right now. He had taken off the cardiac monitor earlier so she did not attempt to replace it. She also reports the patient asked for breakfast prior to falling asleep. No indication to call security at this time, although we continue to have a very low threshold to call security for behavior that is threatening to staff.    Ezequiel Essex, MD

## 2021-03-29 NOTE — Progress Notes (Signed)
Family Medicine Teaching Service Daily Progress Note Intern Pager: [phone]  Patient name: [name] Medical record number: [id number] Date of birth: 03-Jul-1965 Age: 55 y.o. Gender: male  Primary Care Provider: [name], NP Consultants: None Code Status: Full  Pt Overview and Major Events to Date:  11/8 Admitted  Assessment and Plan:  Acute Respiratory Failure likely 2/2 to COPD exacerbation ABG with pH of 7.3 and hypercarbia. On room air satting in low 90s. Mental status more awake, alert and oriented to person, place (calls it "[employer]"), time, situation. Ruddy face. Lung exam after breathing treatment, no wheezes, not dyspneic. Denies chest pain. Says breathing subjectively still bad although he appears better on exam me. WBC 4.2 yesterday, 11.3 today although got steroids yesterday. Procalcitonin <0.10. No fevers overnight.  -O2 sats 88-92% -azithromycin 500 mg daily day 2/3 -duonebs q2h scheduled -albuterol q4h prn  -work on Orthoptist for [employer] spiriva before d/c -fall precautions  Agression  AMA Overnight was aggressive with nurses and night team and wanted to leave AMA at 0600. This morning inappropriate with comments to me but not aggressive with me. Asked if he is staying today and he said "we'll see." He is okay with staying right now. Labs were drawn as well. -Monitor, told him he has good nurses and should treat them right -provide AMA forms if requesting to leave  Hyperammonemia Elevated to 71 yesterday. AST/ALT on CMP 21/27.  Yestrday wnl. Distended, no fluid wave, no abdominal pain.  -ammonia level tomorrow -lactulose 20 g BID  Bradycardia, chronic 40s and 50s overnight. -cardiac monitoring, patient refusing  Hypertension Doesn't take any home meds.  -Monitor  DM2 140-279 yesterday. CBGs ordered. Hgb A1c of 6.3. Got steroids yesterday. -sSSI -CBGs  Polysubstance abuse Admission UDS positive for cocaine.  CIWA 72 most recently. 0, 4, 1 overnight.  -folate, thiamine -ciwa protocol -MV -consider ativan  FEN/GI: Regular diet PPx: Lovenox Dispo:Home pending clinical improvement   Subjective:  Wants additional food, says he'll decide if he wants today or not.  Objective: Temp:  [97.9 F (36.6 C)-98.2 F (36.8 C)] 98.1 F (36.7 C) (11/09 0359) Pulse Rate:  [49-80] 54 (11/09 0359) Resp:  [18-26] 18 (11/09 0359) BP: (115-184)/(64-137) 149/85 (11/09 0359) SpO2:  [93 %-100 %] 94 % (11/09 0359) Weight:  [99.8 kg-100.3 kg] 100.3 kg (11/08 1735) Physical Exam: General: NAD, ruddy appearance, awake, oriented x4 Cardiovascular: Bradycardic to 50s, no m/r/g Respiratory: CTAB no wheezes rales or crackles after breathing treatment on RA Abdomen: Distended, no fluid wave, nontender to palpation Extremities: No LE edema, 2+ pulses  Laboratory: Recent Labs  Lab 03/28/21 0948 03/28/21 1452 03/28/21 1541  WBC  --  4.2  --   HGB 15.6 16.0 16.3  HCT 46.0 47.9 48.0  PLT  --  166  --    Recent Labs  Lab 03/28/21 0935 03/28/21 0948 03/28/21 1452 03/28/21 1541  NA 137 141 136 139  K 3.7 3.6 3.8 3.7  CL 102  --  100  --   CO2 27  --  25  --   BUN 13  --  11  --   CREATININE 0.84  --  0.99  --   CALCIUM 8.7*  --  8.9  --   PROT  --   --  7.6  --   BILITOT  --   --  1.1  --   ALKPHOS  --   --  69  --   ALT  --   --  36  --   AST  --   --  33  --   GLUCOSE 140*  --  279*  --     Imaging/Diagnostic Tests: No results found.   Gerrit Heck, MD 03/29/2021, 7:16 AM PGY-1, [place] Intern pager: [phone], text pages welcome

## 2021-03-29 NOTE — TOC Benefit Eligibility Note (Signed)
Patient Teacher, English as a foreign language completed.    The patient is currently admitted and upon discharge could be taking Spriva Inhaler.  The current 30 day co-pay is, $4.00.   The patient is insured through [place], [employer] Patient Advocate Specialist [place] Patient Advocate Team Direct Number: [phone]  Fax: [phone]

## 2021-03-29 NOTE — Progress Notes (Signed)
FPTS Brief Progress Note  S:Patient walking the halls. RN reports patient becoming aggressive after requesting food multiple times. Patient upset he had not yet received food. RN reports being fearful of him after he raised his voice and started to slam items. I changed his diet to regular since he was now alert and protecting his airway appropriately. RN and MD brought boxed lunch, water, and soda to patient. In the room, patient was verbally aggressive towards both myself and the RN.   Upon seeing the food, the patient became upset and asked if that "was the same sorry shit they serve in the emergency room". I reported that this was what we had available at this time as the cafeteria was closed. He asked for peanut butter and crackers, saying he would "much rather eat that" and that "he would rather starve than eat this sorry ass shit" in the boxed lunch. MD asked RN if that was available on the floor - only crackers available. Patient became upset and asked why "no one could find the initiative to go find peanut butter somewhere in this hospital".   Patient began to open the boxed lunch and apply condiments to the sandwich bread. He asked why he "had been here for two days without food". When supplied with the answer that he was not awake and alert enough to safely eat without choking, he noted "well doesn't [place] have the money to pay someone to sit with me and make sure I don't choke on my food?" Patient would not allow MD to answer.   Patient also complained of poor treatment, but would not elaborate further than "starving for two days". He reports he called his mother who "knows people" and that they would be suing [facility].   After many additional complaints without allowing response or dialogue, he said he would be leaving AMA. I asked the RN to bring [facility] paperwork. Patient then reported that he would wait until 0600 to go AMA. Upon questioning, this is because he was "starving and  hadn't eaten anything for two days" but that no businesses or stores were open until 0600 for him to obtain food.    O: BP 132/84   Pulse (!) 49   Temp 98.1 F (36.7 C) (Oral)   Resp 18   Ht $R'5\' 6"'Rr$  (1.676 m)   Wt 100.3 kg   SpO2 96%   BMI 35.70 kg/m    A/P: Aggressive behavior  AMA Behavior is causing staff to feel unsafe. Discussed with floor staff. VERY low threshold to call security. I verbally discontinued AM lab orders because the lab tech was in the hall and I did not feel comfortable allowing her to go in the room to draw blood with the patient behaving this way. If patient chooses to accept care, we will draw labs later in the morning.   Acute Respiratory Failure  This evening, has normal SpO2 on room air. Initially difficult to arouse and falling asleep mid-sentence on early evening exam; however RN reported patient woke up, was alert, and asked for a diet. Unclear etiology for acute respiratory failure.  - continue to monitor - continuous SpO2 monitoring  Hyperammonemia  Ammonia elevated to 71 on admission. No signs of hepatic etiology, as AST, ALT, and alk phos are within normal limits. Potassium also wnl at 3.8. Patient is much more alert and conversant compared to admission. Unclear etiology of hyperammonemia.  - start lactulose 20 g BID - repeat ammonia level 11/10  Bradycardia Continued this evening. HR ranges 40s-60s. Otherwise VSS. Will continue to monitor.  - continue cardiac monitoring   HTN BP range from normotensive to mildly hypertensive. Home med - lisinopril. Not yet started. Will check med rec status and restart when appropriate.    DM2 sSSI indicated on H&P but did not find correlated CBGs in active orders. Added on tonight. No basal, mealtime, or qHS insulin corrections at this time.    Polysubstance abuse Admission UDS (+) for cocaine. Last CIWA 0. Continue assessments. No change to plan from day team.    - Orders reviewed. Labs for AM ordered,  which was adjusted as needed.  - If condition changes, plan includes aggressive airway maintenance, respiratory support.   Ezequiel Essex, MD 03/29/2021, 3:44 AM PGY-2, [place] [facility] Night Resident  Please page [phone] with questions.

## 2021-03-30 ENCOUNTER — Other Ambulatory Visit (HOSPITAL_COMMUNITY): Payer: Self-pay

## 2021-03-30 LAB — CBC
HCT: 47.5 % (ref 39.0–52.0)
Hemoglobin: 16 g/dL (ref 13.0–17.0)
MCH: 35.4 pg — ABNORMAL HIGH (ref 26.0–34.0)
MCHC: 33.7 g/dL (ref 30.0–36.0)
MCV: 105.1 fL — ABNORMAL HIGH (ref 80.0–100.0)
Platelets: 138 10*3/uL — ABNORMAL LOW (ref 150–400)
RBC: 4.52 MIL/uL (ref 4.22–5.81)
RDW: 13.6 % (ref 11.5–15.5)
WBC: 8.1 10*3/uL (ref 4.0–10.5)
nRBC: 0 % (ref 0.0–0.2)

## 2021-03-30 LAB — AMMONIA: Ammonia: 36 umol/L — ABNORMAL HIGH (ref 9–35)

## 2021-03-30 LAB — BASIC METABOLIC PANEL
Anion gap: 5 (ref 5–15)
BUN: 12 mg/dL (ref 6–20)
CO2: 32 mmol/L (ref 22–32)
Calcium: 8.4 mg/dL — ABNORMAL LOW (ref 8.9–10.3)
Chloride: 99 mmol/L (ref 98–111)
Creatinine, Ser: 0.9 mg/dL (ref 0.61–1.24)
GFR, Estimated: >60 mL/min (ref >60)
Glucose, Bld: 131 mg/dL — ABNORMAL HIGH (ref 70–99)
Potassium: 3.6 mmol/L (ref 3.5–5.1)
Sodium: 136 mmol/L (ref 135–145)

## 2021-03-30 MED ORDER — IPRATROPIUM-ALBUTEROL 0.5-2.5 (3) MG/3ML IN SOLN: 3.0000 mL | Freq: Two times a day (BID) | RESPIRATORY_TRACT | Status: DC

## 2021-03-30 MED ORDER — ACETAMINOPHEN 325 MG PO TABS
650.0000 mg | ORAL_TABLET | Freq: Four times a day (QID) | ORAL | Status: DC | PRN
  Administered 2021-03-30: 650 mg via ORAL
  Filled 2021-03-30: qty 2

## 2021-03-30 MED ORDER — ADULT MULTIVITAMIN W/MINERALS CH: 1.0000 | ORAL_TABLET | Freq: Every day | ORAL | Status: AC

## 2021-03-30 MED ORDER — GUAIFENESIN ER 600 MG PO TB12
600.0000 mg | ORAL_TABLET | Freq: Two times a day (BID) | ORAL | 0 refills | Status: DC
  Filled 2021-03-30: qty 20, 10d supply, fill #0

## 2021-03-30 MED ORDER — FOLIC ACID 1 MG PO TABS: 1.0000 mg | ORAL_TABLET | Freq: Every day | ORAL | Status: AC

## 2021-03-30 MED ORDER — AZITHROMYCIN 250 MG PO TABS
500.0000 mg | ORAL_TABLET | Freq: Every day | ORAL | Status: AC
  Administered 2021-03-30: 500 mg via ORAL
  Filled 2021-03-30: qty 2

## 2021-03-30 MED ORDER — THIAMINE HCL 100 MG PO TABS
100.0000 mg | ORAL_TABLET | Freq: Every day | ORAL | Status: AC
Start: 2021-03-31 — End: ?

## 2021-03-30 MED ORDER — ACETAMINOPHEN 325 MG PO TABS
650.0000 mg | ORAL_TABLET | Freq: Four times a day (QID) | ORAL | 0 refills | Status: AC | PRN
  Filled 2021-03-30: qty 24, 3d supply, fill #0

## 2021-03-30 MED ORDER — GUAIFENESIN ER 600 MG PO TB12
600.0000 mg | ORAL_TABLET | Freq: Two times a day (BID) | ORAL | Status: DC
  Administered 2021-03-30: 600 mg via ORAL
  Filled 2021-03-30: qty 1

## 2021-03-30 NOTE — Progress Notes (Addendum)
Patient refusing to have labs drawn at this time.  Patient educated, patient stated " they cannot take it now but they can come back in 1 hour to take the blood".

## 2021-03-30 NOTE — TOC Transition Note (Signed)
Transition of Care [facility]) - CM/SW Discharge Note   Patient Details  Name: [name] MRN: [id number] Date of Birth: [birth year]  [facility] of [facility]) CM/SW Contact:  Verdell Carmine, RN Phone Number: 03/30/2021, 3:51 PM   Clinical Narrative:     Called patient to speak about discharge planning. Patient states he has no friends or family and nowhere to go. He was staying by [employer] in [place]. This SM asked him has he reached out to [facility], he stated no. He said you figure it out but if I cam going home today then I need transportation to [address], and that's all I have to say and hung up the phone.     Barriers to Discharge: Homeless with medical needs   Patient Goals and CMS Choice        Discharge Placement               Homeless        Discharge Plan and Services In-house Referral: Clinical Social Work Discharge Planning Services: CM Consult                                 Social Determinants of Health (SDOH) Interventions     Readmission Risk Interventions Readmission Risk Prevention Plan 10/10/2020 06/27/2020 10/21/2019  Transportation Screening Complete Complete Complete  PCP or Specialist Appt within 3-5 Days - - Complete  [place] or [place] - - Complete  Social Work Consult for [place] Planning/Counseling - - Complete  Palliative Care Screening - - Complete  Medication Review Press photographer) Complete Complete Complete  PCP or Specialist appointment within 3-5 days of discharge Complete Complete -  [place] or Home Care Consult Complete Complete -  SW Recovery Care/Counseling Consult Complete Complete -  Palliative Care Screening Not Applicable Not Applicable -  [place] Not Applicable Not Applicable -  Some recent data might be hidden

## 2021-03-30 NOTE — Progress Notes (Signed)
Patient refused Lovenox, lactulose and cbg test ; attempted to educate patient he declined.

## 2021-03-30 NOTE — Discharge Summary (Addendum)
[facility] Discharge Summary  Patient name: [name] Medical record number: [id number] Date of birth: 02/21/1966 Age: 55 y.o. Gender: male Date of Admission: 03/28/2021  Date of Discharge: 03/30/2021 Admitting Physician: Gerrit Heck, MD  Primary Care Provider: [name], NP Consultants: None  Indication for Hospitalization: Acute Respiratory Failure 2/2 to COPD exacerbation  Discharge Diagnoses/Problem List:  Shortness of breath Acute respiratory failure  Disposition: Home   Discharge Condition: Stable  Discharge Exam:  General: awake, alert&oriented x4, responsive to questions Head: Normocephalic atraumatic, ruddy appearance CV: Regular rate and rhythm no murmurs rubs or gallops Respiratory: Clear to ausculation bilaterally, no wheezes rales or crackles, chest rises symmetrically,  no increased work of breathing on room air Abdomen: non-tender, distended, normoactive bowel sounds, no fluid wave, no rebound tenderness Extremities: Moves upper and lower extremities freely, no edema in LE Neuro: No focal deficits Skin: No rashes or lesions visualized  Taken from Dr. Annamary Carolin progress note on the day of discharge   Brief Hospital Course:  Acute Respiratory Failure  Presented with shortness of breath.  Has history of CHF and COPD with admission in July of this year for acute encephalopathy with respiratory failure requiring BiPAP therapy and transfer to ICU. EMS called, his home oxygen is 3 L nasal canula but is homeless and has not had access to this. Laying down worsened his shortness of breath. Had  been feeling very lethargic. Took 1 g of cocaine 24 hours prior to admission. Vital signs in ED showed elevated BP with systolics in 111N-356P which later became normotensive. Has been afebrile. HRs in 50s. Started on 3L nasal canula and transitioned to BiPAP. Labs in ED showed BNP 143. Troponin flat 27,29. VBG pH 7.3. CXR showed  cardiomegaly and increased interstitial markings suggesting interstitial pulmonary edema or interstitial pneumonia.There is no focal pulmonary consolidation. Respiratory panel negative for covid and flu. EKG without acute ST or T wave changes. Received duoneb x 1 and solumedrol 125 mg injection. Was started on continuous albuterol nebs after but refused BiPAP mask. Had diffuse expiratory wheezes with limited air movement throughout. He was treated for a COPD exacerbation. He was breathing and saturating well on RA. He was provided a controller inhaler Lebron Conners) prior to discharge.    HTN Systolic blood pressures between 014D-030D systolic but have been more normotensive recently. Patient had no home medications. Likely affected by hospitalizations and cocaine use as well.   DM2 A1c 6.3 this admission. Glucose stable in hospitalization. Takes no home meds.    Polysubstance abuse He admits drug use since he was 55 years old. Snorts and smokes cocaine and uses heroin. UDS was positive for cocaine. Last used cocaine the day before admission. Continues to drink alcohol. Discharged on thiamine, folate and multivitamin. Social work spoke with patient about substance abuse during admission.   Housing Insecurity  Patient given housing resources by clinical Education officer, museum. He was [facility] to the [facility].   Issues for Follow Up:  Recommend substance abuse counseling for cocaine, heroin, alcohol use and cigarettes Consider addition of antihypertensive (not beta blocker given cocaine use) if continue elevated blood pressure at follow up.   Significant Procedures: None  Significant Labs and Imaging:  Recent Labs  Lab 03/28/21 1452 03/28/21 1541 03/29/21 0836 03/30/21 0611  WBC 4.2  --  11.3* 8.1  HGB 16.0 16.3 15.5 16.0  HCT 47.9 48.0 45.4 47.5  PLT 166  --  171 138*   Recent Labs  Lab 03/28/21  0388 03/28/21 0948 03/28/21 1452 03/28/21 1541 03/29/21 0836 03/30/21 0611  NA 137 141 136 139 140  136  K 3.7 3.6 3.8 3.7 3.8 3.6  CL 102  --  100  --  101 99  CO2 27  --  25  --  30 32  GLUCOSE 140*  --  279*  --  150* 131*  BUN 13  --  11  --  13 12  CREATININE 0.84  --  0.99  --  0.84 0.90  CALCIUM 8.7*  --  8.9  --  8.6* 8.4*  ALKPHOS  --   --  69  --  61  --   AST  --   --  33  --  21  --   ALT  --   --  36  --  27  --   ALBUMIN  --   --  3.7  --  3.3*  --       Results/Tests Pending at Time of Discharge:   Discharge Medications:  Allergies as of 03/30/2021       Reactions   Penicillins    Has tolerated cefepime 5/22 From childhood: Has patient had a PCN reaction causing immediate rash, facial/tongue/throat swelling, SOB or lightheadedness with hypotension: Unknown Has patient had a PCN reaction causing severe rash involving mucus membranes or skin necrosis: Unknown Has patient had a PCN reaction that required hospitalization: Unknown Has patient had a PCN reaction occurring within the last 10 years: No If all of the above answers are "NO", then may proceed with Cephalosporin use.        Medication List     TAKE these medications    acetaminophen 325 MG tablet Commonly known as: TYLENOL Take 2 tablets (650 mg total) by mouth every 6 (six) hours as needed for up to 3 days for mild pain.   folic acid 1 MG tablet Commonly known as: FOLVITE Take 1 tablet (1 mg total) by mouth daily. Start taking on: March 31, 2021   guaiFENesin 600 MG 12 hr tablet Commonly known as: MUCINEX Take 1 tablet (600 mg total) by mouth 2 (two) times daily.   multivitamin with minerals Tabs tablet Take 1 tablet by mouth daily. Start taking on: March 31, 2021   Spiriva HandiHaler 18 MCG inhalation capsule Generic drug: tiotropium Place 1 capsule (18 mcg total) into inhaler and inhale daily.   thiamine 100 MG tablet Take 1 tablet (100 mg total) by mouth daily. Start taking on: March 31, 2021         Discharge Instructions: Please refer to Patient Instructions  section of EMR for full details.  Patient was counseled important signs and symptoms that should prompt return to medical care, changes in medications, dietary instructions, activity restrictions, and follow up appointments.   Follow-Up Appointments:  Follow-up Information     [name], NP Follow up.   Specialty: Internal Medicine Why: schedule apptfor follow up Contact information: [address] [place] [address] [phone]                 Rosezetta Schlatter, MD 03/30/2021, 4:47 PM   [name]. DO

## 2021-03-30 NOTE — TOC Initial Note (Signed)
Transition of Care [facility]) - Initial/Assessment Note    Patient Details  Name: [name] MRN: [id number] Date of Birth: Feb 08, 1966  Transition of [facility]) CM/SW Contact:    Randall Carmine, RN Phone Number: 03/30/2021, 8:59 AM  Clinical Narrative:                 [name] [name], and spoke to him, however he declined to speak at this time due to breakfast.  1450 Attempted to call patient in room, no answer. Was called by  [name] about DC Will call to RN for assistance in speaking to patient.     Barriers to Discharge: Homeless with medical needs   Patient Goals and CMS Choice        Expected Discharge Plan and Services   In-house Referral: Clinical Social Work Discharge Planning Services: CM Consult   Living arrangements for the past 2 months: Homeless                                      Prior Living Arrangements/Services Living arrangements for the past 2 months: Homeless Lives with:: Self Patient language and need for interpreter reviewed:: Yes        Need for Family Participation in Patient Care: Yes (Comment) Care giver support system in place?: Yes (comment)   Criminal Activity/Legal Involvement Pertinent to Current Situation/Hospitalization: No - Comment as needed  Activities of Daily Living Home Assistive Devices/Equipment: None ADL Screening (condition at time of admission) Patient's cognitive ability adequate to safely complete daily activities?: Yes Is the patient deaf or have difficulty hearing?: Yes Does the patient have difficulty seeing, even when wearing glasses/contacts?: Yes Does the patient have difficulty concentrating, remembering, or making decisions?: No Patient able to express need for assistance with ADLs?: Yes Does the patient have difficulty dressing or bathing?: No Independently performs ADLs?: Yes (appropriate for developmental age) Does the patient have difficulty walking or climbing stairs?: Yes Weakness of  Legs: Both (COPD too) Weakness of Arms/Hands: None  Permission Sought/Granted                  Emotional Assessment       Orientation: : Oriented to Self, Oriented to Place, Oriented to  Time, Oriented to Situation      Admission diagnosis:  Acute respiratory failure ([place]) [J96.00] Shortness of breath [R06.02] Patient Active Problem List   Diagnosis Date Noted   Acute respiratory failure ([place]) 03/28/2021   Acute on chronic respiratory failure with hypercapnia ([place]) 11/24/2020   Uncontrolled diabetes mellitus 11/24/2020   Class 2 obesity due to excess calories with body mass index (BMI) of 35.0 to 35.9 in adult 11/24/2020   Acute encephalopathy 11/24/2020   Syncope    Acute pulmonary edema ([place]) 11/22/2020   Obesity, Class III, BMI 40-49.9 (morbid obesity) ([place]) 11/22/2020   Obstructive sleep apnea 11/22/2020   Frequent falls 11/22/2020   Obesity hypoventilation syndrome ([place]) 10/02/2020   Acute respiratory failure with hypercapnia ([place]) 09/22/2020   History of COVID-19 09/22/2020   Abnormal chest x-ray 09/22/2020   Obesity (BMI 30.0-34.9) 09/22/2020   HTN (hypertension) 06/25/2020   Acute on chronic respiratory failure with hypoxia and hypercapnia ([place]) 06/25/2020   Pneumonia due to COVID-19 virus 06/22/2020   Acute respiratory failure with hypoxia ([place]) 06/22/2020   Acute heart failure with preserved ejection fraction (HFpEF) ([place]) 11/30/2019   Acute on chronic diastolic CHF (  congestive heart failure) ([place])    Uncontrolled hypertension 10/21/2019   COPD with acute exacerbation ([place]) 10/20/2019   COPD exacerbation ([place]) 10/19/2019   Sinus bradycardia    Acute exacerbation of chronic obstructive pulmonary disease (COPD) ([place]) 10/17/2019   Elevated liver enzymes 06/11/2017   Abnormal liver function tests 06/10/2017   Chronic diastolic CHF (congestive heart failure) ([place]) 06/10/2017   Hypokalemia 06/10/2017   Hypomagnesemia 06/10/2017   Vertebral osteomyelitis,  chronic ([place]) 05/31/2017   Marijuana use 05/31/2017   Osteomyelitis ([place]) 05/24/2017   Discitis 05/24/2017   Diskitis 03/05/2017   Cocaine use    Alcohol abuse    Polysubstance abuse ([place])    Epidural abscess    Discitis thoracic region 02/26/2017   Thoracic back pain    Tobacco abuse    Essential hypertension    Fungal endocarditis    Suicidal ideation    IVDU (intravenous drug user)    Chronic bilateral low back pain without sciatica    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[place])    Vertebral osteomyelitis ([place]) 02/16/2017   Shortness of breath 10/12/2015   Cocaine abuse ([place]) 10/09/2015   Homeless single person    COPD (chronic obstructive pulmonary disease) ([place]) 10/08/2015   PCP:  [name], NP Pharmacy:   [facility] ([place]), [place] - [address] [place] ([place]) [place] [address] Phone: [phone] Fax: [place] [address] [place] [address] Phone: [phone] Fax: [phone]     Social Determinants of Health (SDOH) Interventions    Readmission Risk Interventions Readmission Risk Prevention Plan 10/10/2020 06/27/2020 10/21/2019  Transportation Screening Complete Complete Complete  PCP or Specialist Appt within 3-5 Days - - Complete  HRI or [place] - - Complete  Social Work Consult for [place] Planning/Counseling - - Complete  Palliative Care Screening - - Complete  Medication Review Press photographer) Complete Complete Complete  PCP or Specialist appointment within 3-5 days of discharge Complete Complete -  [place] or Home Care Consult Complete Complete -  SW Recovery Care/Counseling Consult Complete Complete -  Palliative Care Screening Not Applicable Not Applicable -  [place] Not Applicable Not Applicable -  Some recent data might be hidden

## 2021-03-30 NOTE — Progress Notes (Addendum)
Family Medicine Teaching Service Daily Progress Note Intern Pager: [phone]  Patient name: [name] Medical record number: [id number] Date of birth: [birth year] Age: 55 y.o. Gender: male  Primary Care Provider: [name], NP Consultants: None Code Status: Full  Pt Overview and Major Events to Date:  11/8 Admitted  Assessment and Plan:  55 year old male admitted for acute respiratory failure likely secondary to COPD.  Past medical history significant for polysubstance abuse, type 2 diabetes, hypertension, COPD, bradycardia, obesity  Acute respiratory failure likely secondary to COPD exacerbation Charted on 3L but was on room air doing well. No wheezes on lung exam, no iWOB on RA. No monitoring on. Speaking full sentences. Walking well. -Spiriva sent to TOC -albuterol q4h prn -duoneb 4 times daily -azithromycin day 3/3 give last dose before leaving  Aggression / AMA Likely close to discharge given clinical status. -AMA papers   Hyperammonemia 71 to 36 ammonia level. Lactulose given this AM. Liver enzymes normal Mental status A&Ox4. No abdominal pain -monitor mental status  Bradycardia, chronic Better this AM at 60s.  -monitor  HTN Elevated at 383F-383A systolic -consider BP meds (not beta blocker due to cocaine use) outpatient if indicated  DM2 Appropriate glucoses yesterday, refusing CBGs -sSSi   Polysubstance Abuse Admission UDS positive for cocaine. CIWA 1s overnight , 5 most recently. -offer resources at discharge  FEN/GI: Regular PPx: lovenox  Dispo:Home likely today.  Subjective:  Complains of wanting food this AM  Objective: Temp:  [97.5 F (36.4 C)-98.8 F (37.1 C)] 97.5 F (36.4 C) (11/10 9191) Pulse Rate:  [52-66] 62 (11/10 0632) Resp:  [17-24] 24 (11/10 6606) BP: (153-181)/(80-120) 158/120 (11/10 0632) SpO2:  [95 %-98 %] 98 % (11/10 0045) Weight:  [99.9 kg] 99.9 kg (11/10 9977) Physical Exam: General: Ruddy  appearance Cardiovascular: RRR no m/r/g Respiratory: CTAB no w/r/c. No iWOB, no retractions Abdomen: Distended, nontender to palpation, no rebound tenderness or fluid wave Extremities: No LE edema  Laboratory: Recent Labs  Lab 03/28/21 1452 03/28/21 1541 03/29/21 0836  WBC 4.2  --  11.3*  HGB 16.0 16.3 15.5  HCT 47.9 48.0 45.4  PLT 166  --  171   Recent Labs  Lab 03/28/21 0935 03/28/21 0948 03/28/21 1452 03/28/21 1541 03/29/21 0836  NA 137   < > 136 139 140  K 3.7   < > 3.8 3.7 3.8  CL 102  --  100  --  101  CO2 27  --  25  --  30  BUN 13  --  11  --  13  CREATININE 0.84  --  0.99  --  0.84  CALCIUM 8.7*  --  8.9  --  8.6*  PROT  --   --  7.6  --  6.6  BILITOT  --   --  1.1  --  0.7  ALKPHOS  --   --  69  --  61  ALT  --   --  36  --  27  AST  --   --  33  --  21  GLUCOSE 140*  --  279*  --  150*   < > = values in this interval not displayed.    Imaging/Diagnostic Tests:   Gerrit Heck, MD 03/30/2021, 6:42 AM PGY-1, [place] Intern pager: [phone], text pages welcome

## 2021-03-30 NOTE — Plan of Care (Signed)

## 2021-03-30 NOTE — Evaluation (Signed)
Physical Therapy Evaluation/Discharge Patient Details Name: [name] MRN: [id number] DOB: 11-Jun-1965 Today's Date: 03/30/2021  History of Present Illness  Pt is a 55 y.o. male who presented 03/28/21 with SOB. Admitted with acute respiratory failure 2/2 COPD exacerbation. PMH: CHF, chronic bronchitis, chronic lower back pain, COPD, HTN, panic attacks, DM   Clinical Impression  Pt presents with condition above. At baseline, pt is independent and homeless, living [place] a bridge. Pt appears to be at his baseline, displaying WFL strength and balance. He does have DOE 3/4 when ambulating, but maintains SpO2 >/= 92% on RA throughout. Educated pt on pursed lip breathing and energy conservation techniques. Pt concerned about d/c'ing in regards to his android not being charged, thus acquired charger to borrow for pt, in regards to not having some winter clothes or a place to stay. PT contacted social worker and case manager in regards to these last 2 concerns, but pt not very receptive to advice or information about homeless shelters. All education completed and questions answered. PT will sign off.     Recommendations for follow up therapy are one component of a multi-disciplinary discharge planning process, led by the attending physician.  Recommendations may be updated based on patient status, additional functional criteria and insurance authorization.  Follow Up Recommendations No PT follow up    Assistance Recommended at Discharge None  Functional Status Assessment Patient has not had a recent decline in their functional status  Equipment Recommendations  None recommended by PT    Recommendations for Other Services       Precautions / Restrictions Precautions Precautions: None Restrictions Weight Bearing Restrictions: No      Mobility  Bed Mobility Overal bed mobility: Modified Independent             General bed mobility comments: Performs all bed mobility without  difficulty.    Transfers Overall transfer level: Independent Equipment used: None               General transfer comment: No LOB or safety concerns noted.    Ambulation/Gait Ambulation/Gait assistance: Independent Gait Distance (Feet): 500 Feet Assistive device: None Gait Pattern/deviations: Step-through pattern;Decreased stance time - right;Antalgic Gait velocity: slightly reduced Gait velocity interpretation: >2.62 ft/sec, indicative of community ambulatory   General Gait Details: Pt with report of bil chronic knee pain (R>L) with mild antalgic gait pattern noted. Pt reports this is his baseline, no LOB. DOE 3/4 but SpO2 >/= 92% on RA  Stairs            Wheelchair Mobility    Modified Rankin (Stroke Patients Only)       Balance Overall balance assessment: No apparent balance deficits (not formally assessed)                                           Pertinent Vitals/Pain Pain Assessment: Faces Faces Pain Scale: Hurts a little bit Pain Location: knees Pain Descriptors / Indicators: Guarding Pain Intervention(s): Limited activity within patient's tolerance;Monitored during session;Repositioned    Home Living Family/patient expects to be discharged to:: Shelter/Homeless                   Additional Comments: Pt reports he lives [place] bridge. Pt avoids stairs in community.    Prior Function Prior Level of Function : Independent/Modified Independent  Hand Dominance        Extremity/Trunk Assessment   Upper Extremity Assessment Upper Extremity Assessment: Overall WFL for tasks assessed    Lower Extremity Assessment Lower Extremity Assessment: Overall WFL for tasks assessed    Cervical / Trunk Assessment Cervical / Trunk Assessment: Normal  Communication   Communication: No difficulties  Cognition Arousal/Alertness: Awake/alert Behavior During Therapy: Agitated Overall Cognitive Status:  Within Functional Limits for tasks assessed                                 General Comments: Pt agitated upon arrival in regards to situation in that it is cold outside and he is likely leaving today and does not have winter clothes. PT reached out to case manager and social worker to assist in providing shelter resources(pt not receptive to resources) and in finding donated clothes for pt. PT assisted in finding pt a Pensions consultant to borrow.        General Comments General comments (skin integrity, edema, etc.): SpO2 >/= 92% on RA throughout, HR in 80s, DOE 3/4; educated pt on pursed lip breathing and energy conservation techniques    Exercises     Assessment/Plan    PT Assessment Patient does not need any further PT services  PT Problem List         PT Treatment Interventions      PT Goals (Current goals can be found in the Care Plan section)  Acute Rehab PT Goals Patient Stated Goal: to not freeze PT Goal Formulation: All assessment and education complete, DC therapy Time For Goal Achievement: 03/31/21 Potential to Achieve Goals: Good    Frequency     Barriers to discharge        Co-evaluation               AM-PAC PT "6 Clicks" Mobility  Outcome Measure Help needed turning from your back to your side while in a flat bed without using bedrails?: None Help needed moving from lying on your back to sitting on the side of a flat bed without using bedrails?: None Help needed moving to and from a bed to a chair (including a wheelchair)?: None Help needed standing up from a chair using your arms (e.g., wheelchair or bedside chair)?: None Help needed to walk in hospital room?: None Help needed climbing 3-5 steps with a railing? : A Little 6 Click Score: 23    End of Session   Activity Tolerance: Patient tolerated treatment well Patient left: in bed;with call bell/phone within reach   PT Visit Diagnosis: Other abnormalities of gait and mobility  (R26.89)    Time: 3559-7416 PT Time Calculation (min) (ACUTE ONLY): 26 min   Charges:   PT Evaluation $PT Eval Low Complexity: 1 Low PT Treatments $Therapeutic Activity: 8-22 mins        Moishe Spice, PT, DPT Acute Rehabilitation Services  Pager: [phone] Office: [phone]   Orvan Falconer 03/30/2021, 4:35 PM

## 2021-03-30 NOTE — Progress Notes (Signed)
RT attempted to give prn breathing treatment; patient refused stating "he was almost asleep".

## 2021-03-30 NOTE — Discharge Instructions (Addendum)
Dear [name],   Thank you so much for allowing [place] to be part of your care!  You were admitted to [facility] for shortness of breath likely related to a COPD exacerbation. You were given antibiotics for this, breathing treatments, and steroids.    POST-HOSPITAL & CARE INSTRUCTIONS Please take your spiriva inhaler everyday Please let PCP/Specialists know of any changes that were made.  Please see medications section of this packet for any medication changes.   DOCTOR'S APPOINTMENT & FOLLOW UP CARE INSTRUCTIONS  No future appointments.  RETURN PRECAUTIONS:   Take care and be well!  [facility]  [place], [place] [address] [phone]

## 2021-03-30 NOTE — Progress Notes (Signed)
FPTS Brief Progress Note  S: Patient sleeping soundly in bed.  I did not awaken him.  Discussed with RN in the hallway.  She reports he declined a couple of medications and testing.  O: BP (!) 162/80 (BP Location: Right Arm)   Pulse 63   Temp 98.8 F (37.1 C) (Oral)   Resp 17   Ht 5\' 6"  (1.676 m)   Wt 100.3 kg   SpO2 97%   BMI 35.70 kg/m    A/P: -Very low threshold to call security for threatening behavior - Orders reviewed. Labs for AM ordered, which was adjusted as needed.    Ezequiel Essex, MD 03/30/2021, 3:30 AM PGY-2, [place] [facility] Night Resident  Please page [phone] with questions.

## 2021-03-30 NOTE — Social Work (Signed)
  CSW met with pt to offer shelter resources and took him shirts per request. Pt also asked for a jacket because he is homeless but there were none available. Pt refused CS resources bc he stated he knows what the resources were and it was useless. CSW provided pt with a bus pass. CSW signing off.

## 2021-03-31 ENCOUNTER — Telehealth: Payer: Self-pay

## 2021-03-31 ENCOUNTER — Other Ambulatory Visit (HOSPITAL_COMMUNITY): Payer: Self-pay

## 2021-03-31 NOTE — Telephone Encounter (Signed)
Transition Care Management Unsuccessful Follow-up Telephone Call  Date of discharge and from where:  [facility]  Attempts:  1st Attempt  Reason for unsuccessful TCM follow-up call:  Unable to reach patient mother answered on listed phone nr. spoke with his mother stated pt does not have another number. No DPR on file. Left message with th mother to call back this CM or Jane at [phone].

## 2021-04-03 ENCOUNTER — Telehealth: Payer: Self-pay

## 2021-04-03 DIAGNOSIS — Z03818 Encounter for observation for suspected exposure to other biological agents ruled out: Secondary | ICD-10-CM | POA: Diagnosis not present

## 2021-04-03 NOTE — Telephone Encounter (Signed)
Transition Care Management Unsuccessful Follow-up Telephone Call  Date of discharge and from where:  03/30/2021, [facility]  Attempts:  2nd Attempt  Reason for unsuccessful TCM follow-up call:  Left voice message with patient's mother. She said he is staying at a motel but she is not sure where he is. She said he has money to stay there for a short time.  She has not been helping him pay for the motel.  She said she will have him return this call when she hears from him.  Call placed to # [phone], recording stated that the voicemail was not set up.   Need to discuss scheduling a follow up appointment with PCP.

## 2021-04-04 ENCOUNTER — Telehealth: Payer: Self-pay

## 2021-04-04 ENCOUNTER — Emergency Department (HOSPITAL_COMMUNITY)
Admission: EM | Admit: 2021-04-04 | Discharge: 2021-04-04 | Discharge status: Against Medical Advice | Payer: Medicaid Other | Attending: Emergency Medicine | Admitting: Emergency Medicine

## 2021-04-04 ENCOUNTER — Other Ambulatory Visit: Payer: Self-pay

## 2021-04-04 DIAGNOSIS — R079 Chest pain, unspecified: Secondary | ICD-10-CM | POA: Insufficient documentation

## 2021-04-04 DIAGNOSIS — Z5321 Procedure and treatment not carried out due to patient leaving prior to being seen by health care provider: Secondary | ICD-10-CM | POA: Insufficient documentation

## 2021-04-04 DIAGNOSIS — I10 Essential (primary) hypertension: Secondary | ICD-10-CM | POA: Diagnosis not present

## 2021-04-04 DIAGNOSIS — R0689 Other abnormalities of breathing: Secondary | ICD-10-CM | POA: Diagnosis not present

## 2021-04-04 DIAGNOSIS — R0602 Shortness of breath: Secondary | ICD-10-CM | POA: Diagnosis not present

## 2021-04-04 DIAGNOSIS — R0789 Other chest pain: Secondary | ICD-10-CM | POA: Diagnosis not present

## 2021-04-04 DIAGNOSIS — I499 Cardiac arrhythmia, unspecified: Secondary | ICD-10-CM | POA: Diagnosis not present

## 2021-04-04 LAB — CBC
HCT: 47.3 % (ref 39.0–52.0)
Hemoglobin: 16.3 g/dL (ref 13.0–17.0)
MCH: 35.5 pg — ABNORMAL HIGH (ref 26.0–34.0)
MCHC: 34.5 g/dL (ref 30.0–36.0)
MCV: 103.1 fL — ABNORMAL HIGH (ref 80.0–100.0)
Platelets: 178 10*3/uL (ref 150–400)
RBC: 4.59 MIL/uL (ref 4.22–5.81)
RDW: 13.3 % (ref 11.5–15.5)
WBC: 6.2 10*3/uL (ref 4.0–10.5)
nRBC: 0 % (ref 0.0–0.2)

## 2021-04-04 LAB — BASIC METABOLIC PANEL
Anion gap: 10 (ref 5–15)
BUN: 13 mg/dL (ref 6–20)
CO2: 26 mmol/L (ref 22–32)
Calcium: 8.6 mg/dL — ABNORMAL LOW (ref 8.9–10.3)
Chloride: 100 mmol/L (ref 98–111)
Creatinine, Ser: 0.79 mg/dL (ref 0.61–1.24)
GFR, Estimated: >60 mL/min (ref >60)
Glucose, Bld: 166 mg/dL — ABNORMAL HIGH (ref 70–99)
Potassium: 3.9 mmol/L (ref 3.5–5.1)
Sodium: 136 mmol/L (ref 135–145)

## 2021-04-04 LAB — TROPONIN I (HIGH SENSITIVITY): Troponin I (High Sensitivity): 27 ng/L — ABNORMAL HIGH (ref <18)

## 2021-04-04 NOTE — ED Triage Notes (Signed)
Pt with chest pain and shob > 1 week. Seen for same last week and threw his papers away so does not know with what he was dx or what medication he is supposed to be taking. Pt coughing, chest pain worse with same. 95% SpO2 room air. 324 ASA given by EMS.

## 2021-04-04 NOTE — ED Notes (Addendum)
After triage pt to be moved to lobby and he voiced displeasure with inability to be treated in a room and a more comfortable spot. Voiced intent to leave AMA. Discussed risks of leaving AMA. Pt continued with intent to leave. PA Lorin R notified

## 2021-04-04 NOTE — Telephone Encounter (Signed)
Transition Care Management Unsuccessful Follow-up Telephone Call  Date of discharge and from where:  03/30/2021, [facility] - Closed   Attempts:  3rd Attempt  Reason for unsuccessful TCM follow-up call:  Left voice message on # [phone]. Call placed to # [phone] and the voicemail was not set up.  Letter sent to patient requesting he call [facility] to schedule a follow up appointment as we have not been able to reach him

## 2021-04-04 NOTE — ED Provider Notes (Cosign Needed)
Emergency Medicine Provider Triage Evaluation Note  [facility] [name]. , a 55 y.o. male  was evaluated in triage.  Pt complains of chest pain or shortness of breath for over a week.  Patient was apparently seen in the emergency department for the same last week, but states that he threw his papers away so he does not know what he was diagnosed with or what medication he supposed to be taking.  States he believes he was discharged too soon.  Patient states that he has had a nonproductive cough, and his chest pain appears to be worse.  EMS gave 324 mg aspirin in route with no relief in pain.  Patient has 3 L nasal cannula oxygen at home when needed, and he is requiring it in triage.  Review of Systems  Positive: Chest pain, shortness of breath, productive cough Negative: Fevers, chills, abdominal pain, nausea, vomiting  Physical Exam  BP (!) 163/96 (BP Location: Right Arm)   Pulse 68   Temp 98.2 F (36.8 C) (Oral)   Resp (!) 26   SpO2 98%  Gen:   Awake, no distress   Resp:  Normal effort  MSK:   Moves extremities without difficulty  Other:    Medical Decision Making  Medically screening exam initiated at 12:28 PM.  Appropriate orders placed.  [employer]. was informed that the remainder of the evaluation will be completed by another provider, this initial triage assessment does not replace that evaluation, and the importance of remaining in the ED until their evaluation is complete.     Amilah Greenspan T, PA-C 04/04/21 1238

## 2021-04-06 ENCOUNTER — Emergency Department (HOSPITAL_COMMUNITY)
Admission: EM | Admit: 2021-04-06 | Discharge: 2021-04-06 | Disposition: A | Discharge status: Home / Self Care | Payer: Medicaid Other | Attending: Emergency Medicine | Admitting: Emergency Medicine

## 2021-04-06 ENCOUNTER — Encounter (HOSPITAL_COMMUNITY): Payer: Self-pay | Admitting: Emergency Medicine

## 2021-04-06 ENCOUNTER — Other Ambulatory Visit (HOSPITAL_COMMUNITY): Payer: Self-pay

## 2021-04-06 ENCOUNTER — Emergency Department (HOSPITAL_COMMUNITY): Payer: Medicaid Other

## 2021-04-06 DIAGNOSIS — Z955 Presence of coronary angioplasty implant and graft: Secondary | ICD-10-CM | POA: Insufficient documentation

## 2021-04-06 DIAGNOSIS — J80 Acute respiratory distress syndrome: Secondary | ICD-10-CM | POA: Diagnosis not present

## 2021-04-06 DIAGNOSIS — F17210 Nicotine dependence, cigarettes, uncomplicated: Secondary | ICD-10-CM | POA: Diagnosis not present

## 2021-04-06 DIAGNOSIS — E119 Type 2 diabetes mellitus without complications: Secondary | ICD-10-CM | POA: Insufficient documentation

## 2021-04-06 DIAGNOSIS — I5033 Acute on chronic diastolic (congestive) heart failure: Secondary | ICD-10-CM | POA: Insufficient documentation

## 2021-04-06 DIAGNOSIS — Z7951 Long term (current) use of inhaled steroids: Secondary | ICD-10-CM | POA: Diagnosis not present

## 2021-04-06 DIAGNOSIS — Z5900 Homelessness unspecified: Secondary | ICD-10-CM | POA: Diagnosis not present

## 2021-04-06 DIAGNOSIS — I110 Hypertensive heart disease with heart failure: Secondary | ICD-10-CM | POA: Insufficient documentation

## 2021-04-06 DIAGNOSIS — R0902 Hypoxemia: Secondary | ICD-10-CM | POA: Diagnosis not present

## 2021-04-06 DIAGNOSIS — Z8616 Personal history of COVID-19: Secondary | ICD-10-CM | POA: Insufficient documentation

## 2021-04-06 DIAGNOSIS — R0789 Other chest pain: Secondary | ICD-10-CM | POA: Diagnosis present

## 2021-04-06 DIAGNOSIS — I10 Essential (primary) hypertension: Secondary | ICD-10-CM | POA: Diagnosis not present

## 2021-04-06 DIAGNOSIS — J441 Chronic obstructive pulmonary disease with (acute) exacerbation: Secondary | ICD-10-CM | POA: Diagnosis not present

## 2021-04-06 DIAGNOSIS — Z20822 Contact with and (suspected) exposure to covid-19: Secondary | ICD-10-CM | POA: Insufficient documentation

## 2021-04-06 LAB — BASIC METABOLIC PANEL
Anion gap: 5 (ref 5–15)
BUN: 6 mg/dL (ref 6–20)
CO2: 32 mmol/L (ref 22–32)
Calcium: 8.8 mg/dL — ABNORMAL LOW (ref 8.9–10.3)
Chloride: 100 mmol/L (ref 98–111)
Creatinine, Ser: 0.84 mg/dL (ref 0.61–1.24)
GFR, Estimated: >60 mL/min (ref >60)
Glucose, Bld: 146 mg/dL — ABNORMAL HIGH (ref 70–99)
Potassium: 3.5 mmol/L (ref 3.5–5.1)
Sodium: 137 mmol/L (ref 135–145)

## 2021-04-06 LAB — I-STAT VENOUS BLOOD GAS, ED
Acid-Base Excess: 5 mmol/L — ABNORMAL HIGH (ref 0.0–2.0)
Bicarbonate: 33.6 mmol/L — ABNORMAL HIGH (ref 20.0–28.0)
Calcium, Ion: 1.17 mmol/L (ref 1.15–1.40)
HCT: 47 % (ref 39.0–52.0)
Hemoglobin: 16 g/dL (ref 13.0–17.0)
O2 Saturation: 59 %
Potassium: 3.6 mmol/L (ref 3.5–5.1)
Sodium: 140 mmol/L (ref 135–145)
TCO2: 36 mmol/L — ABNORMAL HIGH (ref 22–32)
pCO2, Ven: 62.8 mmHg — ABNORMAL HIGH (ref 44.0–60.0)
pH, Ven: 7.336 (ref 7.250–7.430)
pO2, Ven: 34 mmHg (ref 32.0–45.0)

## 2021-04-06 LAB — CBC WITH DIFFERENTIAL/PLATELET
Abs Immature Granulocytes: 0.03 10*3/uL (ref 0.00–0.07)
Basophils Absolute: 0 10*3/uL (ref 0.0–0.1)
Basophils Relative: 0 %
Eosinophils Absolute: 0.1 10*3/uL (ref 0.0–0.5)
Eosinophils Relative: 2 %
HCT: 46.7 % (ref 39.0–52.0)
Hemoglobin: 16.1 g/dL (ref 13.0–17.0)
Immature Granulocytes: 1 %
Lymphocytes Relative: 35 %
Lymphs Abs: 2 10*3/uL (ref 0.7–4.0)
MCH: 35.4 pg — ABNORMAL HIGH (ref 26.0–34.0)
MCHC: 34.5 g/dL (ref 30.0–36.0)
MCV: 102.6 fL — ABNORMAL HIGH (ref 80.0–100.0)
Monocytes Absolute: 0.5 10*3/uL (ref 0.1–1.0)
Monocytes Relative: 9 %
Neutro Abs: 3 10*3/uL (ref 1.7–7.7)
Neutrophils Relative %: 53 %
Platelets: 180 10*3/uL (ref 150–400)
RBC: 4.55 MIL/uL (ref 4.22–5.81)
RDW: 13.6 % (ref 11.5–15.5)
WBC: 5.6 10*3/uL (ref 4.0–10.5)
nRBC: 0 % (ref 0.0–0.2)

## 2021-04-06 LAB — TROPONIN I (HIGH SENSITIVITY)
Troponin I (High Sensitivity): 34 ng/L — ABNORMAL HIGH (ref <18)
Troponin I (High Sensitivity): 28 ng/L — ABNORMAL HIGH (ref <18)

## 2021-04-06 LAB — RESP PANEL BY RT-PCR (FLU A&B, COVID) ARPGX2
Influenza A by PCR: NEGATIVE
Influenza B by PCR: NEGATIVE
SARS Coronavirus 2 by RT PCR: NEGATIVE

## 2021-04-06 LAB — BRAIN NATRIURETIC PEPTIDE: B Natriuretic Peptide: 222.3 pg/mL — ABNORMAL HIGH (ref 0.0–100.0)

## 2021-04-06 MED ORDER — DOXYCYCLINE HYCLATE 100 MG PO TABS
100.0000 mg | ORAL_TABLET | Freq: Two times a day (BID) | ORAL | 0 refills | Status: DC
  Filled 2021-04-06: qty 20, 10d supply, fill #0

## 2021-04-06 MED ORDER — ALBUTEROL SULFATE HFA 108 (90 BASE) MCG/ACT IN AERS: 1.0000 | INHALATION_SPRAY | RESPIRATORY_TRACT | Status: DC | PRN

## 2021-04-06 MED ORDER — ALBUTEROL SULFATE HFA 108 (90 BASE) MCG/ACT IN AERS
1.0000 | INHALATION_SPRAY | Freq: Four times a day (QID) | RESPIRATORY_TRACT | 0 refills | Status: DC | PRN
  Filled 2021-04-06: qty 18, 30d supply, fill #0

## 2021-04-06 MED ORDER — PREDNISONE 20 MG PO TABS
40.0000 mg | ORAL_TABLET | Freq: Every day | ORAL | 0 refills | Status: AC
  Filled 2021-04-06: qty 10, 5d supply, fill #0

## 2021-04-06 MED ORDER — IPRATROPIUM-ALBUTEROL 0.5-2.5 (3) MG/3ML IN SOLN
3.0000 mL | Freq: Once | RESPIRATORY_TRACT | Status: AC
  Administered 2021-04-06: 09:00:00 3 mL via RESPIRATORY_TRACT
  Filled 2021-04-06: qty 3

## 2021-04-06 MED ORDER — DOXYCYCLINE HYCLATE 100 MG PO CAPS: 100.0000 mg | ORAL_CAPSULE | Freq: Two times a day (BID) | ORAL | 0 refills | Status: DC

## 2021-04-06 MED ORDER — KETOROLAC TROMETHAMINE 15 MG/ML IJ SOLN
15.0000 mg | Freq: Once | INTRAMUSCULAR | Status: AC
  Administered 2021-04-06: 12:00:00 15 mg via INTRAVENOUS
  Filled 2021-04-06: qty 1

## 2021-04-06 MED ORDER — DEXTROMETHORPHAN POLISTIREX ER 30 MG/5ML PO SUER
15.0000 mg | ORAL | 0 refills | Status: DC | PRN
Start: 2021-04-06 — End: 2021-04-06

## 2021-04-06 MED ORDER — DEXTROMETHORPHAN POLISTIREX ER 30 MG/5ML PO SUER
15.0000 mg | ORAL | 0 refills | Status: DC | PRN
  Filled 2021-04-06: qty 89, 30d supply, fill #0

## 2021-04-06 MED ORDER — ALBUTEROL SULFATE (2.5 MG/3ML) 0.083% IN NEBU: 2.5000 mg | INHALATION_SOLUTION | Freq: Four times a day (QID) | RESPIRATORY_TRACT | 12 refills | Status: DC | PRN

## 2021-04-06 NOTE — ED Notes (Signed)
Pt refused vitals and refused to take his dispo papers

## 2021-04-06 NOTE — ED Triage Notes (Signed)
Patient BIB [facility] from home with complaint of shortness of breath, initial SpO2 on scene 92% on room air. 1x duoneb, 125 solumedrol. Patient alert, oriented, speaking in complete sentences, and in no apparent distress at this time.  HR 55-62 which patient states is normal for him BP 133/82
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
## 2021-04-06 NOTE — ED Notes (Signed)
Pt is in lobby waiting for [employer]

## 2021-04-06 NOTE — Discharge Planning (Signed)
Pt left ED before Rx was filled and delivered to him.

## 2021-04-10 DIAGNOSIS — Z03818 Encounter for observation for suspected exposure to other biological agents ruled out: Secondary | ICD-10-CM | POA: Diagnosis not present

## 2021-04-13 ENCOUNTER — Encounter (HOSPITAL_COMMUNITY): Payer: Self-pay | Admitting: Emergency Medicine

## 2021-04-13 ENCOUNTER — Observation Stay (HOSPITAL_COMMUNITY)
Admission: EM | Admit: 2021-04-13 | Discharge: 2021-04-14 | Disposition: A | Discharge status: Home / Self Care | Payer: Medicaid Other | Attending: Family Medicine | Admitting: Family Medicine

## 2021-04-13 ENCOUNTER — Emergency Department (HOSPITAL_COMMUNITY): Payer: Medicaid Other

## 2021-04-13 DIAGNOSIS — J9602 Acute respiratory failure with hypercapnia: Secondary | ICD-10-CM | POA: Diagnosis not present

## 2021-04-13 DIAGNOSIS — J449 Chronic obstructive pulmonary disease, unspecified: Secondary | ICD-10-CM | POA: Diagnosis present

## 2021-04-13 DIAGNOSIS — Z20822 Contact with and (suspected) exposure to covid-19: Secondary | ICD-10-CM | POA: Diagnosis not present

## 2021-04-13 DIAGNOSIS — R1084 Generalized abdominal pain: Secondary | ICD-10-CM | POA: Diagnosis not present

## 2021-04-13 DIAGNOSIS — I5033 Acute on chronic diastolic (congestive) heart failure: Secondary | ICD-10-CM | POA: Insufficient documentation

## 2021-04-13 DIAGNOSIS — E119 Type 2 diabetes mellitus without complications: Secondary | ICD-10-CM | POA: Diagnosis not present

## 2021-04-13 DIAGNOSIS — R9431 Abnormal electrocardiogram [ECG] [EKG]: Secondary | ICD-10-CM | POA: Diagnosis not present

## 2021-04-13 DIAGNOSIS — R0602 Shortness of breath: Secondary | ICD-10-CM | POA: Diagnosis present

## 2021-04-13 DIAGNOSIS — F1910 Other psychoactive substance abuse, uncomplicated: Secondary | ICD-10-CM | POA: Diagnosis present

## 2021-04-13 DIAGNOSIS — F17210 Nicotine dependence, cigarettes, uncomplicated: Secondary | ICD-10-CM | POA: Insufficient documentation

## 2021-04-13 DIAGNOSIS — R4182 Altered mental status, unspecified: Secondary | ICD-10-CM | POA: Insufficient documentation

## 2021-04-13 DIAGNOSIS — I110 Hypertensive heart disease with heart failure: Secondary | ICD-10-CM | POA: Insufficient documentation

## 2021-04-13 DIAGNOSIS — J441 Chronic obstructive pulmonary disease with (acute) exacerbation: Secondary | ICD-10-CM | POA: Diagnosis not present

## 2021-04-13 DIAGNOSIS — Z5900 Homelessness unspecified: Secondary | ICD-10-CM

## 2021-04-13 DIAGNOSIS — R0689 Other abnormalities of breathing: Secondary | ICD-10-CM

## 2021-04-13 DIAGNOSIS — Z79899 Other long term (current) drug therapy: Secondary | ICD-10-CM | POA: Insufficient documentation

## 2021-04-13 DIAGNOSIS — Z8616 Personal history of COVID-19: Secondary | ICD-10-CM | POA: Insufficient documentation

## 2021-04-13 DIAGNOSIS — F1410 Cocaine abuse, uncomplicated: Secondary | ICD-10-CM | POA: Diagnosis present

## 2021-04-13 DIAGNOSIS — R0902 Hypoxemia: Secondary | ICD-10-CM | POA: Diagnosis not present

## 2021-04-13 DIAGNOSIS — I10 Essential (primary) hypertension: Secondary | ICD-10-CM | POA: Diagnosis not present

## 2021-04-13 LAB — I-STAT ARTERIAL BLOOD GAS, ED
Acid-Base Excess: 5 mmol/L — ABNORMAL HIGH (ref 0.0–2.0)
Acid-Base Excess: 8 mmol/L — ABNORMAL HIGH (ref 0.0–2.0)
Bicarbonate: 33.4 mmol/L — ABNORMAL HIGH (ref 20.0–28.0)
Bicarbonate: 37.8 mmol/L — ABNORMAL HIGH (ref 20.0–28.0)
Calcium, Ion: 1.26 mmol/L (ref 1.15–1.40)
Calcium, Ion: 1.26 mmol/L (ref 1.15–1.40)
HCT: 44 % (ref 39.0–52.0)
HCT: 44 % (ref 39.0–52.0)
Hemoglobin: 15 g/dL (ref 13.0–17.0)
Hemoglobin: 15 g/dL (ref 13.0–17.0)
O2 Saturation: 86 %
O2 Saturation: 97 %
Patient temperature: 98.7
Potassium: 3.5 mmol/L (ref 3.5–5.1)
Potassium: 3.7 mmol/L (ref 3.5–5.1)
Sodium: 141 mmol/L (ref 135–145)
Sodium: 141 mmol/L (ref 135–145)
TCO2: 35 mmol/L — ABNORMAL HIGH (ref 22–32)
TCO2: 40 mmol/L — ABNORMAL HIGH (ref 22–32)
pCO2 arterial: 67.9 mmHg (ref 32.0–48.0)
pCO2 arterial: 79.7 mmHg (ref 32.0–48.0)
pH, Arterial: 7.3 — ABNORMAL LOW (ref 7.350–7.450)
pH, Arterial: 7.284 — ABNORMAL LOW (ref 7.350–7.450)
pO2, Arterial: 59 mmHg — ABNORMAL LOW (ref 83.0–108.0)
pO2, Arterial: 111 mmHg — ABNORMAL HIGH (ref 83.0–108.0)

## 2021-04-13 LAB — CBC WITH DIFFERENTIAL/PLATELET
Abs Immature Granulocytes: 0.04 10*3/uL (ref 0.00–0.07)
Basophils Absolute: 0 10*3/uL (ref 0.0–0.1)
Basophils Relative: 0 %
Eosinophils Absolute: 0.1 10*3/uL (ref 0.0–0.5)
Eosinophils Relative: 2 %
HCT: 47.2 % (ref 39.0–52.0)
Hemoglobin: 15.6 g/dL (ref 13.0–17.0)
Immature Granulocytes: 1 %
Lymphocytes Relative: 32 %
Lymphs Abs: 2 10*3/uL (ref 0.7–4.0)
MCH: 35.5 pg — ABNORMAL HIGH (ref 26.0–34.0)
MCHC: 33.1 g/dL (ref 30.0–36.0)
MCV: 107.3 fL — ABNORMAL HIGH (ref 80.0–100.0)
Monocytes Absolute: 0.6 10*3/uL (ref 0.1–1.0)
Monocytes Relative: 9 %
Neutro Abs: 3.6 10*3/uL (ref 1.7–7.7)
Neutrophils Relative %: 56 %
Platelets: 174 10*3/uL (ref 150–400)
RBC: 4.4 MIL/uL (ref 4.22–5.81)
RDW: 13.9 % (ref 11.5–15.5)
WBC: 6.4 10*3/uL (ref 4.0–10.5)
nRBC: 0 % (ref 0.0–0.2)

## 2021-04-13 LAB — BASIC METABOLIC PANEL
Anion gap: 5 (ref 5–15)
BUN: 9 mg/dL (ref 6–20)
CO2: 34 mmol/L — ABNORMAL HIGH (ref 22–32)
Calcium: 9.1 mg/dL (ref 8.9–10.3)
Chloride: 101 mmol/L (ref 98–111)
Creatinine, Ser: 1.04 mg/dL (ref 0.61–1.24)
GFR, Estimated: >60 mL/min (ref >60)
Glucose, Bld: 185 mg/dL — ABNORMAL HIGH (ref 70–99)
Potassium: 3.8 mmol/L (ref 3.5–5.1)
Sodium: 140 mmol/L (ref 135–145)

## 2021-04-13 LAB — RESP PANEL BY RT-PCR (FLU A&B, COVID) ARPGX2
Influenza A by PCR: NEGATIVE
Influenza B by PCR: NEGATIVE
SARS Coronavirus 2 by RT PCR: NEGATIVE

## 2021-04-13 LAB — BRAIN NATRIURETIC PEPTIDE: B Natriuretic Peptide: 378 pg/mL — ABNORMAL HIGH (ref 0.0–100.0)

## 2021-04-13 MED ORDER — IPRATROPIUM-ALBUTEROL 0.5-2.5 (3) MG/3ML IN SOLN
3.0000 mL | Freq: Once | RESPIRATORY_TRACT | Status: AC
  Administered 2021-04-13: 3 mL via RESPIRATORY_TRACT
  Filled 2021-04-13: qty 3

## 2021-04-13 MED ORDER — HYDRALAZINE HCL 25 MG PO TABS
50.0000 mg | ORAL_TABLET | Freq: Once | ORAL | Status: AC
  Administered 2021-04-13: 50 mg via ORAL
  Filled 2021-04-13: qty 2

## 2021-04-13 MED ORDER — METHYLPREDNISOLONE SODIUM SUCC 125 MG IJ SOLR
125.0000 mg | Freq: Once | INTRAMUSCULAR | Status: AC
  Administered 2021-04-13: 125 mg via INTRAVENOUS
  Filled 2021-04-13: qty 2

## 2021-04-13 MED ORDER — FUROSEMIDE 10 MG/ML IJ SOLN
60.0000 mg | Freq: Once | INTRAMUSCULAR | Status: AC
  Administered 2021-04-13: 60 mg via INTRAVENOUS
  Filled 2021-04-13: qty 6

## 2021-04-13 NOTE — ED Notes (Signed)
Dr. Micheline Maze ( ICU MD) at bedside evaluating patient .

## 2021-04-13 NOTE — ED Notes (Signed)
BIPAP applied by RT .
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
## 2021-04-13 NOTE — ED Notes (Addendum)
Patient refused foley catheter , urinal used .

## 2021-04-14 ENCOUNTER — Other Ambulatory Visit (HOSPITAL_COMMUNITY): Payer: Medicaid Other

## 2021-04-14 ENCOUNTER — Observation Stay (HOSPITAL_COMMUNITY): Payer: Medicaid Other

## 2021-04-14 ENCOUNTER — Other Ambulatory Visit: Payer: Self-pay

## 2021-04-14 DIAGNOSIS — J9602 Acute respiratory failure with hypercapnia: Secondary | ICD-10-CM | POA: Diagnosis present

## 2021-04-14 DIAGNOSIS — Z20822 Contact with and (suspected) exposure to covid-19: Secondary | ICD-10-CM | POA: Diagnosis not present

## 2021-04-14 DIAGNOSIS — R0689 Other abnormalities of breathing: Secondary | ICD-10-CM | POA: Insufficient documentation

## 2021-04-14 DIAGNOSIS — E119 Type 2 diabetes mellitus without complications: Secondary | ICD-10-CM | POA: Diagnosis not present

## 2021-04-14 DIAGNOSIS — J441 Chronic obstructive pulmonary disease with (acute) exacerbation: Secondary | ICD-10-CM | POA: Diagnosis not present

## 2021-04-14 LAB — CBC
HCT: 46.7 % (ref 39.0–52.0)
Hemoglobin: 16 g/dL (ref 13.0–17.0)
MCH: 36.2 pg — ABNORMAL HIGH (ref 26.0–34.0)
MCHC: 34.3 g/dL (ref 30.0–36.0)
MCV: 105.7 fL — ABNORMAL HIGH (ref 80.0–100.0)
Platelets: 178 10*3/uL (ref 150–400)
RBC: 4.42 MIL/uL (ref 4.22–5.81)
RDW: 13.8 % (ref 11.5–15.5)
WBC: 4.4 10*3/uL (ref 4.0–10.5)
nRBC: 0 % (ref 0.0–0.2)

## 2021-04-14 LAB — I-STAT ARTERIAL BLOOD GAS, ED
Acid-Base Excess: 7 mmol/L — ABNORMAL HIGH (ref 0.0–2.0)
Bicarbonate: 35.3 mmol/L — ABNORMAL HIGH (ref 20.0–28.0)
Calcium, Ion: 1.27 mmol/L (ref 1.15–1.40)
HCT: 47 % (ref 39.0–52.0)
Hemoglobin: 16 g/dL (ref 13.0–17.0)
O2 Saturation: 96 %
Patient temperature: 98.3
Potassium: 3.9 mmol/L (ref 3.5–5.1)
Sodium: 140 mmol/L (ref 135–145)
TCO2: 37 mmol/L — ABNORMAL HIGH (ref 22–32)
pCO2 arterial: 63.6 mmHg — ABNORMAL HIGH (ref 32.0–48.0)
pH, Arterial: 7.351 (ref 7.350–7.450)
pO2, Arterial: 88 mmHg (ref 83.0–108.0)

## 2021-04-14 LAB — PROTIME-INR
INR: 1 (ref 0.8–1.2)
Prothrombin Time: 13.5 seconds (ref 11.4–15.2)

## 2021-04-14 LAB — COMPREHENSIVE METABOLIC PANEL
ALT: 36 U/L (ref 0–44)
AST: 26 U/L (ref 15–41)
Albumin: 3.7 g/dL (ref 3.5–5.0)
Alkaline Phosphatase: 70 U/L (ref 38–126)
Anion gap: 8 (ref 5–15)
BUN: 11 mg/dL (ref 6–20)
CO2: 30 mmol/L (ref 22–32)
Calcium: 9.1 mg/dL (ref 8.9–10.3)
Chloride: 98 mmol/L (ref 98–111)
Creatinine, Ser: 0.92 mg/dL (ref 0.61–1.24)
GFR, Estimated: >60 mL/min (ref >60)
Glucose, Bld: 245 mg/dL — ABNORMAL HIGH (ref 70–99)
Potassium: 3.8 mmol/L (ref 3.5–5.1)
Sodium: 136 mmol/L (ref 135–145)
Total Bilirubin: 0.6 mg/dL (ref 0.3–1.2)
Total Protein: 7.3 g/dL (ref 6.5–8.1)

## 2021-04-14 LAB — BLOOD GAS, ARTERIAL
Acid-Base Excess: 5.9 mmol/L — ABNORMAL HIGH (ref 0.0–2.0)
Acid-Base Excess: 5.1 mmol/L — ABNORMAL HIGH (ref 0.0–2.0)
Bicarbonate: 31.2 mmol/L — ABNORMAL HIGH (ref 20.0–28.0)
Bicarbonate: 30.4 mmol/L — ABNORMAL HIGH (ref 20.0–28.0)
Drawn by: 365271
FIO2: 32
FIO2: 32
O2 Saturation: 97.6 %
O2 Saturation: 94.2 %
Patient temperature: 36.9
Patient temperature: 37
pCO2 arterial: 57.1 mmHg — ABNORMAL HIGH (ref 32.0–48.0)
pCO2 arterial: 56.2 mmHg — ABNORMAL HIGH (ref 32.0–48.0)
pH, Arterial: 7.357 (ref 7.350–7.450)
pH, Arterial: 7.352 (ref 7.350–7.450)
pO2, Arterial: 98.5 mmHg (ref 83.0–108.0)
pO2, Arterial: 72.5 mmHg — ABNORMAL LOW (ref 83.0–108.0)

## 2021-04-14 LAB — GLUCOSE, CAPILLARY
Glucose-Capillary: 258 mg/dL — ABNORMAL HIGH (ref 70–99)
Glucose-Capillary: 210 mg/dL — ABNORMAL HIGH (ref 70–99)

## 2021-04-14 LAB — TSH: TSH: 0.452 u[IU]/mL (ref 0.350–4.500)

## 2021-04-14 LAB — MAGNESIUM: Magnesium: 1.9 mg/dL (ref 1.7–2.4)

## 2021-04-14 LAB — ETHANOL: Alcohol, Ethyl (B): <10 mg/dL (ref <10)

## 2021-04-14 MED ORDER — INSULIN ASPART 100 UNIT/ML IJ SOLN
0.0000 [IU] | INTRAMUSCULAR | Status: DC
Start: 2021-04-14 — End: 2021-04-14
  Administered 2021-04-14: 3 [IU] via SUBCUTANEOUS
  Administered 2021-04-14: 5 [IU] via SUBCUTANEOUS

## 2021-04-14 MED ORDER — SODIUM CHLORIDE 0.9% FLUSH: 3.0000 mL | INTRAVENOUS | Status: DC | PRN

## 2021-04-14 MED ORDER — ACETAMINOPHEN 650 MG RE SUPP: 650.0000 mg | Freq: Four times a day (QID) | RECTAL | Status: DC | PRN

## 2021-04-14 MED ORDER — PREDNISONE 20 MG PO TABS
40.0000 mg | ORAL_TABLET | Freq: Every day | ORAL | 0 refills | Status: AC
Start: 2021-04-15 — End: 2021-04-19

## 2021-04-14 MED ORDER — THIAMINE HCL 100 MG PO TABS
100.0000 mg | ORAL_TABLET | Freq: Every day | ORAL | Status: DC
  Administered 2021-04-14: 100 mg via ORAL
  Filled 2021-04-14: qty 1

## 2021-04-14 MED ORDER — SENNOSIDES-DOCUSATE SODIUM 8.6-50 MG PO TABS: 1.0000 | ORAL_TABLET | Freq: Every evening | ORAL | Status: DC | PRN

## 2021-04-14 MED ORDER — CHLORHEXIDINE GLUCONATE 0.12 % MT SOLN
15.0000 mL | Freq: Two times a day (BID) | OROMUCOSAL | Status: DC
  Administered 2021-04-14: 15 mL via OROMUCOSAL
  Filled 2021-04-14: qty 15

## 2021-04-14 MED ORDER — UMECLIDINIUM BROMIDE 62.5 MCG/ACT IN AEPB
1.0000 | INHALATION_SPRAY | Freq: Every day | RESPIRATORY_TRACT | Status: DC
  Filled 2021-04-14: qty 7

## 2021-04-14 MED ORDER — ADULT MULTIVITAMIN W/MINERALS CH
1.0000 | ORAL_TABLET | Freq: Every day | ORAL | Status: DC
  Administered 2021-04-14: 1 via ORAL
  Filled 2021-04-14: qty 1

## 2021-04-14 MED ORDER — ORAL CARE MOUTH RINSE: 15.0000 mL | Freq: Two times a day (BID) | OROMUCOSAL | Status: DC

## 2021-04-14 MED ORDER — ALBUTEROL SULFATE (2.5 MG/3ML) 0.083% IN NEBU: 2.5000 mg | INHALATION_SOLUTION | Freq: Four times a day (QID) | RESPIRATORY_TRACT | Status: DC | PRN

## 2021-04-14 MED ORDER — ALBUTEROL SULFATE (2.5 MG/3ML) 0.083% IN NEBU: 2.5000 mg | INHALATION_SOLUTION | Freq: Four times a day (QID) | RESPIRATORY_TRACT | 12 refills | Status: AC | PRN

## 2021-04-14 MED ORDER — PREDNISONE 20 MG PO TABS
40.0000 mg | ORAL_TABLET | Freq: Every day | ORAL | Status: DC
  Filled 2021-04-14: qty 2

## 2021-04-14 MED ORDER — ACETAMINOPHEN 325 MG PO TABS: 650.0000 mg | ORAL_TABLET | Freq: Four times a day (QID) | ORAL | Status: DC | PRN

## 2021-04-14 MED ORDER — UMECLIDINIUM BROMIDE 62.5 MCG/ACT IN AEPB: 1.0000 | INHALATION_SPRAY | Freq: Every day | RESPIRATORY_TRACT | 0 refills | Status: AC

## 2021-04-14 MED ORDER — METHYLPREDNISOLONE SODIUM SUCC 125 MG IJ SOLR
125.0000 mg | Freq: Once | INTRAMUSCULAR | Status: AC
  Administered 2021-04-14: 125 mg via INTRAVENOUS
  Filled 2021-04-14: qty 2

## 2021-04-14 MED ORDER — ENOXAPARIN SODIUM 40 MG/0.4ML IJ SOSY
40.0000 mg | PREFILLED_SYRINGE | INTRAMUSCULAR | Status: DC
  Administered 2021-04-14: 40 mg via SUBCUTANEOUS
  Filled 2021-04-14: qty 0.4

## 2021-04-14 MED ORDER — TIOTROPIUM BROMIDE MONOHYDRATE 18 MCG IN CAPS: 18.0000 ug | ORAL_CAPSULE | Freq: Every day | RESPIRATORY_TRACT | Status: DC

## 2021-04-14 MED ORDER — FOLIC ACID 1 MG PO TABS
1.0000 mg | ORAL_TABLET | Freq: Every day | ORAL | Status: DC
  Administered 2021-04-14: 1 mg via ORAL
  Filled 2021-04-14: qty 1

## 2021-04-14 NOTE — Progress Notes (Signed)
Unable to perform ABG for 2330 w/ EMT x1. Pt became combative and agitated. RT will attempt ABG later. Pt is still resting on BiPAP. RT will cont to monitor.

## 2021-04-14 NOTE — Hospital Course (Signed)
[name]. is a 55 y.o. male presenting with acute on chronic hypercapnic respiratory failure. PMHx is significant for polysubstance use, HFpEF (09/23/2020), COPD, HTN, T2DM, housing insecurities, discitis.  Acute on chronic hypercapnic respiratory failure likely 2/2 COPD vs CHF exacerbation Patient presented with shortness of breath, productive cough, tachypnea and somnolence.  Patient with a history of COPD and CHF with recent cocaine use which complicated clinical picture. In the ED, initial ABG showed pH 7.3, PCO2 67.9, PO2 59, HCO3 33.4, and patient was placed on BiPAP.  Repeat ABG showed worsening acidemia and hypercapnia.  ICU was consulted in the ED, adjusted the BiPap setting and patient was admitted to the floor.  CXR and labs with concern for vascular congestion, patient was given IV lasix with good urine output.  Patient was placed on BiPAP while he tolerated it and had significant improvement in his respiratory status and was back to what seems like his baseline hours later.  Patient declined further use of BiPap until he was able to eat in the morning and stated he was leaving this afternoon even if it was AMA. His pH improved to normal and pCO2 remained elevated but patient was adamant that he was discharging due to having a meeting for his housing situation. Patient understood the risks of going home without further treatment. Given that patient had had significant improvement in his symptoms and it is unclear how much of his symptoms may have been related to the cocaine use, patient was discharged with prednisone and controller inhaler.    HTN: Uncontrolled Patient with fluctuating blood pressures in the setting of acute respiratory failure and concern for recent cocaine use. Blood pressures had somewhat improved prior to discharge though still elevated. Patient encouraged to take home medications.   Bradycardia: Chronic Chronic bradycardia with heart rates persistently in  the 40-60s, patient asymptomatic.    Polysubstance use disorder (EtOH, tobacco, cocaine, ?heroin and ?IVDU) UDS unable to be collected, but patient reported cocaine use prior to admission. History of alcohol and tobacco use since age 60. Per chart review, history of IV drug use. Patient was placed on folate and thiamine supplementation and encouraged to continue outpatient.  UDS pending on admission. Reported last cocaine use the a.m. prior to admission. EtOH and tobacco use since 55 years old. Snorts and smokes cocaine.  Per chart, history of IV drug use.  T2DM: Chronic, stable A1c 6.3 (03/28/2021). CBG 185 on admission. Patient received a total of 8U of novolog during his <24h admission.    Poor social determinants of health Patient is facing housing insecurity, patient reported he had a meeting at 2pm to discuss his housing and possibly being able to live in a hotel for the next month. He was going to leave AMA to get to the meet, but given significant clinical improvement (more than expected), shared decision was made to discharge the patient.

## 2021-04-14 NOTE — Progress Notes (Signed)
Patient bradycardiac and got call from nursing. Went to room and patient was awake and HR in the 80s. Patient stating he will leave if he doesn't get food. He will be compliant with the BiPap if he is able to get food before. Patient states he will also be leaving at 2pm no matter what because he has a meeting for his housing as he is homeless.  Will give order for diet while not on BiPap  Randall Diloreto, DO

## 2021-04-14 NOTE — ED Notes (Signed)
Admitting MD at bedside.

## 2021-04-14 NOTE — ED Notes (Signed)
Attempted to call report

## 2021-04-14 NOTE — Progress Notes (Addendum)
Pt signed [place] paperwork. Pt taking shower and then he reports he will leave. Family medicine made aware.   AMA cancelled. Family medicine placed discharge order in.

## 2021-04-14 NOTE — Progress Notes (Signed)
   04/14/21 0257  Assess: MEWS Score  Temp 98.4 F (36.9 C)  BP (!) 166/92  Pulse Rate 66  Resp (!) 26  Level of Consciousness Alert  SpO2 97 %  O2 Device Bi-PAP  FiO2 (%) 30 %  Assess: MEWS Score  MEWS Temp 0  MEWS Systolic 0  MEWS Pulse 0  MEWS RR 2  MEWS LOC 0  MEWS Score 2  MEWS Score Color Yellow  Assess: if the MEWS score is Yellow or Red  Were vital signs taken at a resting state? Yes  Focused Assessment No change from prior assessment  Early Detection of Sepsis Score *See Row Information* Low  MEWS guidelines implemented *See Row Information* Yes  Treat  MEWS Interventions Escalated (See documentation below)  Take Vital Signs  Increase Vital Sign Frequency  Yellow: Q 2hr X 2 then Q 4hr X 2, if remains yellow, continue Q 4hrs  Escalate  MEWS: Escalate Yellow: discuss with charge nurse/RN and consider discussing with provider and RRT  Notify: Charge Nurse/RN  Name of Charge Nurse/RN Notified Debra RN  Date Charge Nurse/RN Notified 04/14/21  Time Charge Nurse/RN Notified 0320  Document  Patient Outcome Not stable and remains on department  Progress note created (see row info) Yes

## 2021-04-14 NOTE — Progress Notes (Signed)
ABG sent to lab. Lab called.

## 2021-04-14 NOTE — Discharge Summary (Signed)
[facility] Discharge Summary  Patient name: [name]. Medical record number: [id number] Date of birth: 01/11/1966 Age: 55 y.o. Gender: male Date of Admission: 04/13/2021  Date of Discharge: 04/14/2021  Admitting Physician: Merrily Brittle, DO  Primary Care Provider: [name], NP Consultants: None  Indication for Hospitalization: Dyspnea with productive cough  Discharge Diagnoses/Problem List:   Acute hypercapnic respiratory failure (HCC)  COPD (chronic obstructive pulmonary disease) (HCC)  Cocaine abuse ([place])  Shortness of breath  Polysubstance abuse ([place])  Acute exacerbation of chronic obstructive pulmonary disease (COPD) ([place])  Disposition: Home  Discharge Condition: Improved  Discharge Exam:  General: NAD, sitting up in bed, conversant  CV: RRR, 2+ peripheral pulses Pulm: breathing comfortably on room air, no increased WOB, clear to auscultation bilaterally GI: soft, non-tender, mild distension, umbilical hernia present  Brief Hospital Course:  [name]. is a 55 y.o. male presenting with acute on chronic hypercapnic respiratory failure. PMHx is significant for polysubstance use, HFpEF (09/23/2020), COPD, HTN, T2DM, housing insecurities, discitis.  Acute on chronic hypercapnic respiratory failure likely 2/2 COPD vs CHF exacerbation Patient presented with shortness of breath, productive cough, tachypnea and somnolence.  Patient with a history of COPD and CHF with recent cocaine use which complicated clinical picture. In the ED, initial ABG showed pH 7.3, PCO2 67.9, PO2 59, HCO3 33.4, and patient was placed on BiPAP.  Repeat ABG showed worsening acidemia and hypercapnia.  ICU was consulted in the ED, adjusted the BiPap setting and patient was admitted to the floor.  CXR and labs with concern for vascular congestion, patient was given IV lasix with good urine output.  Patient was placed on BiPAP while he  tolerated it and had significant improvement in his respiratory status and was back to what seems like his baseline hours later.  Patient declined further use of BiPap until he was able to eat in the morning and stated he was leaving this afternoon even if it was AMA. His pH improved to normal and pCO2 remained elevated but patient was adamant that he was discharging due to having a meeting for his housing situation. Patient understood the risks of going home without further treatment. Given that patient had had significant improvement in his symptoms and it is unclear how much of his symptoms may have been related to the cocaine use, patient was discharged with prednisone and controller inhaler.    HTN: Uncontrolled Patient with fluctuating blood pressures in the setting of acute respiratory failure and concern for recent cocaine use. Blood pressures had somewhat improved prior to discharge though still elevated. Patient encouraged to take home medications.   Bradycardia: Chronic Chronic bradycardia with heart rates persistently in the 40-60s, patient asymptomatic.    Polysubstance use disorder (EtOH, tobacco, cocaine, ?heroin and ?IVDU) UDS unable to be collected, but patient reported cocaine use prior to admission. History of alcohol and tobacco use since age 61. Per chart review, history of IV drug use. Patient was placed on folate and thiamine supplementation and encouraged to continue outpatient.  UDS pending on admission. Reported last cocaine use the a.m. prior to admission. EtOH and tobacco use since 55 years old. Snorts and smokes cocaine.  Per chart, history of IV drug use.  T2DM: Chronic, stable A1c 6.3 (03/28/2021). CBG 185 on admission. Patient received a total of 8U of novolog during his <24h admission.    Poor social determinants of health Patient is facing housing insecurity, patient reported he had  a meeting at 2pm to discuss his housing and possibly being able to live in a hotel for  the next month. He was going to leave AMA to get to the meet, but given significant clinical improvement (more than expected), shared decision was made to discharge the patient.    Significant Procedures: None  Significant Labs and Imaging:  Recent Labs  Lab 04/13/21 2100 04/13/21 2221 04/14/21 0158 04/14/21 0457  WBC 6.4  --   --  4.4  HGB 15.6 15.0 16.0 16.0  HCT 47.2 44.0 47.0 46.7  PLT 174  --   --  178   Recent Labs  Lab 04/13/21 2050 04/13/21 2100 04/13/21 2221 04/14/21 0158 04/14/21 0457  NA 141 140 141 140 136  K 3.5 3.8 3.7 3.9 3.8  CL  --  101  --   --  98  CO2  --  34*  --   --  30  GLUCOSE  --  185*  --   --  245*  BUN  --  9  --   --  11  CREATININE  --  1.04  --   --  0.92  CALCIUM  --  9.1  --   --  9.1  MG  --   --   --   --  1.9  ALKPHOS  --   --   --   --  70  AST  --   --   --   --  26  ALT  --   --   --   --  36  ALBUMIN  --   --   --   --  3.7    CT HEAD WO CONTRAST (5MM)  Result Date: 04/14/2021 CLINICAL DATA:  Mental status change, unknown cause. EXAM: CT HEAD WITHOUT CONTRAST TECHNIQUE: Contiguous axial images were obtained from the base of the skull through the vertex without intravenous contrast. COMPARISON:  10/02/2020. FINDINGS: Brain: No acute intracranial hemorrhage, midline shift or mass effect. No extra-axial fluid collection. Gray-white matter differentiation is within normal limits and there is no hydrocephalus. Examination is limited due to motion artifact. Vascular: No hyperdense vessel or unexpected calcification. Skull: Normal. Negative for fracture or focal lesion. Sinuses/Orbits: Air-fluid levels are noted in the maxillary sinuses bilaterally. There is a round density in the ethmoid air cells on the left, possible small mucosal retention cyst or polyp. The orbits are unremarkable. Other: None. IMPRESSION: No acute intracranial process. Electronically Signed   By: Brett Fairy M.D.   On: 04/14/2021 03:17   DG Chest Port 1  View  Result Date: 04/13/2021 CLINICAL DATA:  Shortness of breath. EXAM: PORTABLE CHEST 1 VIEW COMPARISON:  04/06/2021. FINDINGS: The heart is enlarged and there is atherosclerotic calcification of the aorta. The pulmonary vasculature is mildly distended. Lung volumes are low. Interstitial and mild airspace opacities are noted bilaterally. No effusion or pneumothorax. No acute osseous abnormality. IMPRESSION: 1. Cardiomegaly with mild pulmonary vascular congestion. 2. Increased interstitial and airspace opacities in the lungs bilaterally, possible edema or infiltrate. Electronically Signed   By: Brett Fairy M.D.   On: 04/13/2021 21:03     Results/Tests Pending at Time of Discharge: None  Discharge Medications:  Allergies as of 04/14/2021       Reactions   Penicillins    Has tolerated cefepime 5/22 From childhood: Has patient had a PCN reaction causing immediate rash, facial/tongue/throat swelling, SOB or lightheadedness with hypotension: Unknown Has patient had a PCN reaction  causing severe rash involving mucus membranes or skin necrosis: Unknown Has patient had a PCN reaction that required hospitalization: Unknown Has patient had a PCN reaction occurring within the last 10 years: No If all of the above answers are "NO", then may proceed with Cephalosporin use.        Medication List     STOP taking these medications    albuterol 108 (90 Base) MCG/ACT inhaler Commonly known as: VENTOLIN HFA Replaced by: albuterol (2.5 MG/3ML) 0.083% nebulizer solution   dextromethorphan 30 MG/5ML liquid Commonly known as: Delsym   guaiFENesin 600 MG 12 hr tablet Commonly known as: MUCINEX   Spiriva HandiHaler 18 MCG inhalation capsule Generic drug: tiotropium       TAKE these medications    albuterol (2.5 MG/3ML) 0.083% nebulizer solution Commonly known as: PROVENTIL Inhale 3 mLs (2.5 mg total) into the lungs every 6 (six) hours as needed for wheezing or shortness of  breath. Replaces: albuterol 108 (90 Base) MCG/ACT inhaler   folic acid 1 MG tablet Commonly known as: FOLVITE Take 1 tablet (1 mg total) by mouth daily.   multivitamin with minerals Tabs tablet Take 1 tablet by mouth daily.   predniSONE 20 MG tablet Commonly known as: DELTASONE Take 2 tablets (40 mg total) by mouth daily with breakfast for 4 days. Start taking on: April 15, 2021   thiamine 100 MG tablet Take 1 tablet (100 mg total) by mouth daily.   umeclidinium bromide 62.5 MCG/ACT Aepb Commonly known as: INCRUSE ELLIPTA Inhale 1 puff into the lungs daily. Start taking on: April 15, 2021        Discharge Instructions: Please refer to Patient Instructions section of EMR for full details.  Patient was counseled important signs and symptoms that should prompt return to medical care, changes in medications, dietary instructions, activity restrictions, and follow up appointments.   Follow-Up Appointments:   [name], DO 04/14/2021, 2:21 PM PGY-2, [place]
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
## 2021-04-14 NOTE — Progress Notes (Signed)
Pt transported from ED to CT then 6E 10 without event.  Pt able to move himself from stretcher to bed without assistance.  RT performed check on bipap whille pt was awake and acting a little wild, hence the high VT/Ve on bipap.  RT will continue to monitor.

## 2021-04-17 ENCOUNTER — Telehealth: Payer: Self-pay

## 2021-04-17 DIAGNOSIS — Z03818 Encounter for observation for suspected exposure to other biological agents ruled out: Secondary | ICD-10-CM | POA: Diagnosis not present

## 2021-04-17 NOTE — Telephone Encounter (Signed)
Transition Care Management Unsuccessful Follow-up Telephone Call  Date of discharge and from where:  1125/2022, [facility]  Attempts:  1st Attempt  Reason for unsuccessful TCM follow-up call:  Left voice message on # [phone]. This is also the phone number for patient's mother.  Calls placed to # [phone] and # [phone] and the voicemail was not set up on either number  Need to discuss scheduling a hospital follow up appointment.

## 2021-04-18 ENCOUNTER — Telehealth: Payer: Self-pay

## 2021-04-18 NOTE — Telephone Encounter (Signed)
Transition Care Management Follow-up Telephone Call Date of discharge and from where: 04/14/2021, [facility] How have you been since you were released from the hospital? He said he is all right but was " in the middle of something."  He was not able to talk but agreed to call this CM back when he was available. He said he has the phone number for [facility].  He was reached on # [phone].  Need to schedule hospital follow up appointment

## 2021-05-28 ENCOUNTER — Emergency Department (HOSPITAL_COMMUNITY): Payer: Medicaid Other

## 2021-05-28 ENCOUNTER — Inpatient Hospital Stay (HOSPITAL_COMMUNITY): Payer: Medicaid Other

## 2021-05-28 ENCOUNTER — Inpatient Hospital Stay (HOSPITAL_COMMUNITY)
Admission: EM | Admit: 2021-05-28 | Discharge: 2021-05-30 | DRG: 308 | Disposition: A | Discharge status: Home / Self Care | Payer: Medicaid Other | Attending: Internal Medicine | Admitting: Internal Medicine

## 2021-05-28 DIAGNOSIS — E119 Type 2 diabetes mellitus without complications: Secondary | ICD-10-CM | POA: Diagnosis present

## 2021-05-28 DIAGNOSIS — Z8249 Family history of ischemic heart disease and other diseases of the circulatory system: Secondary | ICD-10-CM

## 2021-05-28 DIAGNOSIS — Z9114 Patient's other noncompliance with medication regimen: Secondary | ICD-10-CM | POA: Diagnosis not present

## 2021-05-28 DIAGNOSIS — F1910 Other psychoactive substance abuse, uncomplicated: Secondary | ICD-10-CM | POA: Diagnosis not present

## 2021-05-28 DIAGNOSIS — F1490 Cocaine use, unspecified, uncomplicated: Secondary | ICD-10-CM | POA: Diagnosis not present

## 2021-05-28 DIAGNOSIS — J449 Chronic obstructive pulmonary disease, unspecified: Secondary | ICD-10-CM | POA: Diagnosis present

## 2021-05-28 DIAGNOSIS — I480 Paroxysmal atrial fibrillation: Principal | ICD-10-CM | POA: Diagnosis present

## 2021-05-28 DIAGNOSIS — R000 Tachycardia, unspecified: Secondary | ICD-10-CM | POA: Diagnosis not present

## 2021-05-28 DIAGNOSIS — Z823 Family history of stroke: Secondary | ICD-10-CM

## 2021-05-28 DIAGNOSIS — Z809 Family history of malignant neoplasm, unspecified: Secondary | ICD-10-CM

## 2021-05-28 DIAGNOSIS — I4892 Unspecified atrial flutter: Secondary | ICD-10-CM | POA: Diagnosis present

## 2021-05-28 DIAGNOSIS — I5033 Acute on chronic diastolic (congestive) heart failure: Secondary | ICD-10-CM | POA: Diagnosis present

## 2021-05-28 DIAGNOSIS — R778 Other specified abnormalities of plasma proteins: Secondary | ICD-10-CM | POA: Diagnosis not present

## 2021-05-28 DIAGNOSIS — J439 Emphysema, unspecified: Secondary | ICD-10-CM | POA: Diagnosis not present

## 2021-05-28 DIAGNOSIS — R079 Chest pain, unspecified: Secondary | ICD-10-CM | POA: Diagnosis not present

## 2021-05-28 DIAGNOSIS — I499 Cardiac arrhythmia, unspecified: Secondary | ICD-10-CM | POA: Diagnosis not present

## 2021-05-28 DIAGNOSIS — I4891 Unspecified atrial fibrillation: Secondary | ICD-10-CM

## 2021-05-28 DIAGNOSIS — Z79899 Other long term (current) drug therapy: Secondary | ICD-10-CM

## 2021-05-28 DIAGNOSIS — F17210 Nicotine dependence, cigarettes, uncomplicated: Secondary | ICD-10-CM | POA: Diagnosis present

## 2021-05-28 DIAGNOSIS — F410 Panic disorder [episodic paroxysmal anxiety] without agoraphobia: Secondary | ICD-10-CM | POA: Diagnosis present

## 2021-05-28 DIAGNOSIS — G4733 Obstructive sleep apnea (adult) (pediatric): Secondary | ICD-10-CM | POA: Diagnosis present

## 2021-05-28 DIAGNOSIS — I110 Hypertensive heart disease with heart failure: Secondary | ICD-10-CM | POA: Diagnosis present

## 2021-05-28 DIAGNOSIS — I10 Essential (primary) hypertension: Secondary | ICD-10-CM

## 2021-05-28 DIAGNOSIS — J9611 Chronic respiratory failure with hypoxia: Secondary | ICD-10-CM

## 2021-05-28 DIAGNOSIS — D6959 Other secondary thrombocytopenia: Secondary | ICD-10-CM | POA: Diagnosis present

## 2021-05-28 DIAGNOSIS — F1410 Cocaine abuse, uncomplicated: Secondary | ICD-10-CM | POA: Diagnosis present

## 2021-05-28 DIAGNOSIS — R0789 Other chest pain: Secondary | ICD-10-CM | POA: Diagnosis not present

## 2021-05-28 DIAGNOSIS — Z20822 Contact with and (suspected) exposure to covid-19: Secondary | ICD-10-CM | POA: Diagnosis present

## 2021-05-28 DIAGNOSIS — E876 Hypokalemia: Secondary | ICD-10-CM

## 2021-05-28 DIAGNOSIS — Z5900 Homelessness unspecified: Secondary | ICD-10-CM

## 2021-05-28 DIAGNOSIS — J9612 Chronic respiratory failure with hypercapnia: Secondary | ICD-10-CM | POA: Diagnosis present

## 2021-05-28 DIAGNOSIS — I517 Cardiomegaly: Secondary | ICD-10-CM | POA: Diagnosis not present

## 2021-05-28 LAB — COMPREHENSIVE METABOLIC PANEL
ALT: 39 U/L (ref 0–44)
AST: 31 U/L (ref 15–41)
Albumin: 3.2 g/dL — ABNORMAL LOW (ref 3.5–5.0)
Alkaline Phosphatase: 65 U/L (ref 38–126)
Anion gap: 8 (ref 5–15)
BUN: 12 mg/dL (ref 6–20)
CO2: 27 mmol/L (ref 22–32)
Calcium: 8.3 mg/dL — ABNORMAL LOW (ref 8.9–10.3)
Chloride: 101 mmol/L (ref 98–111)
Creatinine, Ser: 0.73 mg/dL (ref 0.61–1.24)
GFR, Estimated: >60 mL/min (ref >60)
Glucose, Bld: 102 mg/dL — ABNORMAL HIGH (ref 70–99)
Potassium: 3.2 mmol/L — ABNORMAL LOW (ref 3.5–5.1)
Sodium: 136 mmol/L (ref 135–145)
Total Bilirubin: 1.1 mg/dL (ref 0.3–1.2)
Total Protein: 6.2 g/dL — ABNORMAL LOW (ref 6.5–8.1)

## 2021-05-28 LAB — I-STAT CHEM 8, ED
BUN: 13 mg/dL (ref 6–20)
Calcium, Ion: 1.07 mmol/L — ABNORMAL LOW (ref 1.15–1.40)
Chloride: 99 mmol/L (ref 98–111)
Creatinine, Ser: 0.8 mg/dL (ref 0.61–1.24)
Glucose, Bld: 97 mg/dL (ref 70–99)
HCT: 42 % (ref 39.0–52.0)
Hemoglobin: 14.3 g/dL (ref 13.0–17.0)
Potassium: 3.2 mmol/L — ABNORMAL LOW (ref 3.5–5.1)
Sodium: 138 mmol/L (ref 135–145)
TCO2: 28 mmol/L (ref 22–32)

## 2021-05-28 LAB — RESP PANEL BY RT-PCR (FLU A&B, COVID) ARPGX2
Influenza A by PCR: NEGATIVE
Influenza B by PCR: NEGATIVE
SARS Coronavirus 2 by RT PCR: NEGATIVE

## 2021-05-28 LAB — CBC
HCT: 42.2 % (ref 39.0–52.0)
Hemoglobin: 14.9 g/dL (ref 13.0–17.0)
MCH: 35 pg — ABNORMAL HIGH (ref 26.0–34.0)
MCHC: 35.3 g/dL (ref 30.0–36.0)
MCV: 99.1 fL (ref 80.0–100.0)
Platelets: 124 10*3/uL — ABNORMAL LOW (ref 150–400)
RBC: 4.26 MIL/uL (ref 4.22–5.81)
RDW: 12.4 % (ref 11.5–15.5)
WBC: 7.3 10*3/uL (ref 4.0–10.5)
nRBC: 0 % (ref 0.0–0.2)

## 2021-05-28 LAB — I-STAT ARTERIAL BLOOD GAS, ED
Acid-Base Excess: 2 mmol/L (ref 0.0–2.0)
Bicarbonate: 29 mmol/L — ABNORMAL HIGH (ref 20.0–28.0)
Calcium, Ion: 1.22 mmol/L (ref 1.15–1.40)
HCT: 38 % — ABNORMAL LOW (ref 39.0–52.0)
Hemoglobin: 12.9 g/dL — ABNORMAL LOW (ref 13.0–17.0)
O2 Saturation: 97 %
Potassium: 3.8 mmol/L (ref 3.5–5.1)
Sodium: 138 mmol/L (ref 135–145)
TCO2: 31 mmol/L (ref 22–32)
pCO2 arterial: 55.1 mmHg — ABNORMAL HIGH (ref 32.0–48.0)
pH, Arterial: 7.329 — ABNORMAL LOW (ref 7.350–7.450)
pO2, Arterial: 97 mmHg (ref 83.0–108.0)

## 2021-05-28 LAB — RAPID URINE DRUG SCREEN, HOSP PERFORMED
Amphetamines: NOT DETECTED
Barbiturates: NOT DETECTED
Benzodiazepines: NOT DETECTED
Cocaine: POSITIVE — AB
Opiates: NOT DETECTED
Tetrahydrocannabinol: NOT DETECTED

## 2021-05-28 LAB — HEMOGLOBIN A1C
Hgb A1c MFr Bld: 6.1 % — ABNORMAL HIGH (ref 4.8–5.6)
Mean Plasma Glucose: 128.37 mg/dL

## 2021-05-28 LAB — TSH: TSH: 2.169 u[IU]/mL (ref 0.350–4.500)

## 2021-05-28 LAB — TROPONIN I (HIGH SENSITIVITY)
Troponin I (High Sensitivity): 94 ng/L — ABNORMAL HIGH (ref <18)
Troponin I (High Sensitivity): 356 ng/L (ref <18)

## 2021-05-28 LAB — GLUCOSE, CAPILLARY: Glucose-Capillary: 195 mg/dL — ABNORMAL HIGH (ref 70–99)

## 2021-05-28 LAB — PHOSPHORUS: Phosphorus: 3.6 mg/dL (ref 2.5–4.6)

## 2021-05-28 LAB — BRAIN NATRIURETIC PEPTIDE: B Natriuretic Peptide: 345 pg/mL — ABNORMAL HIGH (ref 0.0–100.0)

## 2021-05-28 LAB — MAGNESIUM: Magnesium: 1.6 mg/dL — ABNORMAL LOW (ref 1.7–2.4)

## 2021-05-28 MED ORDER — MAGNESIUM SULFATE 2 GM/50ML IV SOLN
2.0000 g | Freq: Once | INTRAVENOUS | Status: AC
  Administered 2021-05-28: 2 g via INTRAVENOUS
  Filled 2021-05-28: qty 50

## 2021-05-28 MED ORDER — LORAZEPAM 2 MG/ML IJ SOLN: 1.0000 mg | INTRAMUSCULAR | Status: DC | PRN

## 2021-05-28 MED ORDER — LORAZEPAM 1 MG PO TABS
1.0000 mg | ORAL_TABLET | ORAL | Status: DC | PRN
  Administered 2021-05-29: 1 mg via ORAL
  Filled 2021-05-28: qty 1

## 2021-05-28 MED ORDER — SODIUM CHLORIDE 0.9% FLUSH
3.0000 mL | Freq: Two times a day (BID) | INTRAVENOUS | Status: DC
  Administered 2021-05-28 – 2021-05-29 (×3): 3 mL via INTRAVENOUS

## 2021-05-28 MED ORDER — AMIODARONE HCL IN DEXTROSE 360-4.14 MG/200ML-% IV SOLN
60.0000 mg/h | INTRAVENOUS | Status: AC
  Administered 2021-05-28: 60 mg/h via INTRAVENOUS
  Filled 2021-05-28 (×2): qty 200

## 2021-05-28 MED ORDER — DILTIAZEM HCL-DEXTROSE 125-5 MG/125ML-% IV SOLN (PREMIX)
2.5000 mg/h | INTRAVENOUS | Status: DC
  Administered 2021-05-28: 5 mg/h via INTRAVENOUS
  Filled 2021-05-28: qty 125

## 2021-05-28 MED ORDER — IOHEXOL 350 MG/ML SOLN
75.0000 mL | Freq: Once | INTRAVENOUS | Status: AC | PRN
  Administered 2021-05-28: 75 mL via INTRAVENOUS

## 2021-05-28 MED ORDER — SODIUM CHLORIDE 0.9 % IV SOLN: INTRAVENOUS | Status: DC

## 2021-05-28 MED ORDER — THIAMINE HCL 100 MG/ML IJ SOLN
100.0000 mg | Freq: Every day | INTRAMUSCULAR | Status: DC
  Filled 2021-05-28: qty 2

## 2021-05-28 MED ORDER — AMIODARONE LOAD VIA INFUSION
150.0000 mg | Freq: Once | INTRAVENOUS | Status: AC
  Administered 2021-05-28: 150 mg via INTRAVENOUS
  Filled 2021-05-28: qty 83.34

## 2021-05-28 MED ORDER — SODIUM CHLORIDE 0.9 % IV BOLUS
1000.0000 mL | Freq: Once | INTRAVENOUS | Status: AC
  Administered 2021-05-28: 1000 mL via INTRAVENOUS

## 2021-05-28 MED ORDER — FOLIC ACID 1 MG PO TABS
1.0000 mg | ORAL_TABLET | Freq: Every day | ORAL | Status: DC
  Administered 2021-05-28 – 2021-05-29 (×2): 1 mg via ORAL
  Filled 2021-05-28 (×3): qty 1

## 2021-05-28 MED ORDER — AMIODARONE HCL IN DEXTROSE 360-4.14 MG/200ML-% IV SOLN
30.0000 mg/h | INTRAVENOUS | Status: DC
  Administered 2021-05-28 (×2): 30 mg/h via INTRAVENOUS
  Filled 2021-05-28: qty 200

## 2021-05-28 MED ORDER — ADULT MULTIVITAMIN W/MINERALS CH
1.0000 | ORAL_TABLET | Freq: Every day | ORAL | Status: DC
  Administered 2021-05-28 – 2021-05-29 (×2): 1 via ORAL
  Filled 2021-05-28 (×3): qty 1

## 2021-05-28 MED ORDER — ALBUTEROL SULFATE (2.5 MG/3ML) 0.083% IN NEBU: 2.5000 mg | INHALATION_SOLUTION | Freq: Four times a day (QID) | RESPIRATORY_TRACT | Status: DC | PRN

## 2021-05-28 MED ORDER — THIAMINE HCL 100 MG PO TABS
100.0000 mg | ORAL_TABLET | Freq: Every day | ORAL | Status: DC
  Administered 2021-05-28 – 2021-05-29 (×2): 100 mg via ORAL
  Filled 2021-05-28 (×3): qty 1

## 2021-05-28 MED ORDER — ENOXAPARIN SODIUM 40 MG/0.4ML IJ SOSY
40.0000 mg | PREFILLED_SYRINGE | INTRAMUSCULAR | Status: DC
  Administered 2021-05-28 – 2021-05-29 (×2): 40 mg via SUBCUTANEOUS
  Filled 2021-05-28 (×2): qty 0.4

## 2021-05-28 MED ORDER — UMECLIDINIUM BROMIDE 62.5 MCG/ACT IN AEPB
1.0000 | INHALATION_SPRAY | Freq: Every day | RESPIRATORY_TRACT | Status: DC
  Filled 2021-05-28: qty 7

## 2021-05-28 MED ORDER — POTASSIUM CHLORIDE CRYS ER 20 MEQ PO TBCR
60.0000 meq | EXTENDED_RELEASE_TABLET | ORAL | Status: AC
Start: 2021-05-28 — End: 2021-05-28
  Administered 2021-05-28: 60 meq via ORAL
  Filled 2021-05-28: qty 3

## 2021-05-28 MED ORDER — ACETAMINOPHEN 650 MG RE SUPP: 650.0000 mg | Freq: Four times a day (QID) | RECTAL | Status: DC | PRN

## 2021-05-28 MED ORDER — INSULIN ASPART 100 UNIT/ML IJ SOLN
0.0000 [IU] | Freq: Three times a day (TID) | INTRAMUSCULAR | Status: DC
  Administered 2021-05-28 – 2021-05-29 (×2): 2 [IU] via SUBCUTANEOUS

## 2021-05-28 MED ORDER — ACETAMINOPHEN 325 MG PO TABS
650.0000 mg | ORAL_TABLET | Freq: Four times a day (QID) | ORAL | Status: DC | PRN
  Administered 2021-05-28: 650 mg via ORAL
  Filled 2021-05-28: qty 2

## 2021-05-28 NOTE — ED Notes (Signed)
Pt calls out, restless and c/o chest pain, very uncomfortable. Messaged Dr Tamala Julian with this finding
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
## 2021-05-28 NOTE — Plan of Care (Signed)
  Problem: Clinical Measurements: Goal: Ability to maintain clinical measurements within normal limits will improve Outcome: Progressing   Problem: Clinical Measurements: Goal: Respiratory complications will improve Outcome: Progressing   Problem: Clinical Measurements: Goal: Cardiovascular complication will be avoided Outcome: Progressing   Problem: Pain Managment: Goal: General experience of comfort will improve Outcome: Progressing   Problem: Safety: Goal: Ability to remain free from injury will improve Outcome: Progressing   

## 2021-05-28 NOTE — ED Notes (Signed)
Pt BP soft, titrated cardizem drip and monitoring closely at this time. Pt resting in stretcher with eyes closed, no distress noted, call light in reach. Monitoring equipment in place.

## 2021-05-28 NOTE — Progress Notes (Signed)
°  Transition of Care [facility]) Screening Note   Patient Details  Name: [name]. Date of Birth: 1966/04/04   [facility] of [facility]) CM/SW Contact:    Alfredia Ferguson, LCSW Phone Number: 05/28/2021, 8:05 AM    Transition of Care Department [facility]) has reviewed patient and no TOC needs have been identified at this time. We will continue to monitor patient advancement through interdisciplinary progression rounds. If new patient transition needs arise, please place a TOC consult.

## 2021-05-28 NOTE — ED Notes (Signed)
Pt rhythm remains irregular with rate in the 50s-60s, endorses complete relief of symptoms at this time. Call light in reach, resting with eyes closed.

## 2021-05-28 NOTE — ED Notes (Signed)
Amiodarone gtt initiated with bolus infusing. Pt endorses relief of symptoms. HR in the 70s-80s, remains irregular

## 2021-05-28 NOTE — ED Notes (Signed)
Pt calls out asking for BR, adamantly refuses to use BSC, cursed at staff and stated he was walking to the bathroom regardless. RN emphasized that his HR was just now under control and unwise to walk, pt disregarded this and demanded to go to BR. RN gave pt non-slip socks and obtained w/c, pt was agreeable to w/c to bathroom instead of walking. Pt remains on amiodarone gtt. Pt educated on call bell use in the BR.

## 2021-05-28 NOTE — ED Provider Notes (Signed)
[facility] EMERGENCY DEPARTMENT Provider Note  CSN: [id number] Arrival date & time: 05/28/21 0335  Chief Complaint(s) Chest Pain (Pt arrives with [facility] c/o chest pain and SHOB for "a few days". Pt was in Afib w/ RVR and has no hx of afib; pt also has hx of COPD; given 500 ml NS bolus by EMS.//EMS vitals://BP 120/70/96% O2 on RA/18 RR/170 HR (highest))  HPI [name]. is a 56 y.o. male with a past medical history listed below including COPD, heart failure with a last EF of 65% in May 2022, polysubstance abuse who presents to the emergency department with chest pain, shortness of breath and tachycardia.  Patient reports that he was recently incarcerated and discharged several days ago.  For 3 days he has been on a cocaine binge and has not slept in that timeframe.  He began having chest pain tonight while trying to sleep but reports that his heart has been racing since he has been using cocaine.  Pain is left-sided nonradiating.  No recent fevers or infections.  Reports chronic cough.  No lower extremity edema.  Patient does admit to frequent alcohol use but reports he last drank 3 weeks ago, just before being incarcerated. The history is provided by the patient.   Past Medical History Past Medical History:  Diagnosis Date   CHF (congestive heart failure) (HCC)    Chronic bronchitis (HCC)    Chronic lower back pain    COPD (chronic obstructive pulmonary disease) (HCC)    Diskitis    Hypertension    Panic attacks    Tobacco abuse    Uncontrolled diabetes mellitus 11/24/2020   Patient Active Problem List   Diagnosis Date Noted   Atrial fibrillation with RVR ([place]) 05/28/2021   Acute hypercapnic respiratory failure ([place]) 04/14/2021   Hypercapnia    Acute respiratory failure ([place]) 03/28/2021   Acute on chronic respiratory failure with hypercapnia ([place]) 11/24/2020   Uncontrolled diabetes mellitus 11/24/2020   Class 2 obesity due to excess calories  with body mass index (BMI) of 35.0 to 35.9 in adult 11/24/2020   Acute encephalopathy 11/24/2020   Syncope    Acute pulmonary edema ([place]) 11/22/2020   Obesity, Class III, BMI 40-49.9 (morbid obesity) ([place]) 11/22/2020   Obstructive sleep apnea 11/22/2020   Frequent falls 11/22/2020   Obesity hypoventilation syndrome ([place]) 10/02/2020   Acute respiratory failure with hypercapnia ([place]) 09/22/2020   History of COVID-19 09/22/2020   Abnormal chest x-ray 09/22/2020   Obesity (BMI 30.0-34.9) 09/22/2020   HTN (hypertension) 06/25/2020   Acute on chronic respiratory failure with hypoxia and hypercapnia ([place]) 06/25/2020   Pneumonia due to COVID-19 virus 06/22/2020   Acute respiratory failure with hypoxia ([place]) 06/22/2020   Acute heart failure with preserved ejection fraction (HFpEF) ([place]) 11/30/2019   Acute on chronic diastolic CHF (congestive heart failure) ([place])    Uncontrolled hypertension 10/21/2019   COPD with acute exacerbation ([place]) 10/20/2019   COPD exacerbation ([place]) 10/19/2019   Sinus bradycardia    Acute exacerbation of chronic obstructive pulmonary disease (COPD) ([place]) 10/17/2019   Elevated liver enzymes 06/11/2017   Abnormal liver function tests 06/10/2017   Chronic diastolic CHF (congestive heart failure) ([place]) 06/10/2017   Hypokalemia 06/10/2017   Hypomagnesemia 06/10/2017   Vertebral osteomyelitis, chronic ([place]) 05/31/2017   Marijuana use 05/31/2017   Osteomyelitis ([place]) 05/24/2017   Discitis 05/24/2017   Diskitis 03/05/2017   Cocaine use    Alcohol abuse    Polysubstance abuse ([place])  Epidural abscess    Discitis thoracic region 02/26/2017   Thoracic back pain    Tobacco abuse    Essential hypertension    Fungal endocarditis    Suicidal ideation    IVDU (intravenous drug user)    Chronic bilateral low back pain without sciatica    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[place])    Vertebral osteomyelitis ([place]) 02/16/2017   Shortness of breath 10/12/2015   Cocaine abuse ([place])  10/09/2015   Homeless single person    COPD (chronic obstructive pulmonary disease) ([place]) 10/08/2015   Home Medication(s) Prior to Admission medications   Medication Sig Start Date End Date Taking? Authorizing Provider  albuterol (PROVENTIL) (2.5 MG/3ML) 0.083% nebulizer solution Inhale 3 mLs (2.5 mg total) into the lungs every 6 (six) hours as needed for wheezing or shortness of breath. Patient not taking: Reported on 05/28/2021 04/14/21   [name], DO  folic acid (FOLVITE) 1 MG tablet Take 1 tablet (1 mg total) by mouth daily. Patient not taking: Reported on 04/14/2021 03/31/21   [name], DO  Multiple Vitamin (MULTIVITAMIN WITH MINERALS) TABS tablet Take 1 tablet by mouth daily. Patient not taking: Reported on 04/14/2021 03/31/21   [name], DO  thiamine 100 MG tablet Take 1 tablet (100 mg total) by mouth daily. Patient not taking: Reported on 04/14/2021 03/31/21   [name], DO  umeclidinium bromide (INCRUSE ELLIPTA) 62.5 MCG/ACT AEPB Inhale 1 puff into the lungs daily. Patient not taking: Reported on 05/28/2021 04/15/21   [name], DO                                                                                                                                    Allergies Penicillins  Review of Systems Review of Systems As noted in HPI  Physical Exam Vital Signs  I have reviewed the triage vital signs BP (!) 98/58    Pulse (!)133  Resp 13    SpO2 98%   Physical Exam Vitals reviewed.  Constitutional:      General: He is not in acute distress.    Appearance: He is well-developed. He is not diaphoretic.  HENT:     Head: Normocephalic and atraumatic.     Nose: Nose normal.  Eyes:     General: No scleral icterus.       Right eye: No discharge.        Left eye: No discharge.     Conjunctiva/sclera: Conjunctivae normal.     Pupils: Pupils are equal, round, and reactive to light.  Cardiovascular:     Rate and Rhythm: Rhythm irregularly irregular.      Heart sounds: No murmur heard.   No friction rub. No gallop.  Pulmonary:     Effort: Pulmonary effort is normal. No respiratory distress.     Breath sounds: Normal breath sounds. No stridor. No rales.  Abdominal:     General: There is no distension.  Palpations: Abdomen is soft.     Tenderness: There is no abdominal tenderness.  Musculoskeletal:        General: No tenderness.     Cervical back: Normal range of motion and neck supple.     Right lower leg: No edema.     Left lower leg: No edema.  Skin:    General: Skin is warm and dry.     Findings: No erythema or rash.  Neurological:     Mental Status: He is alert and oriented to person, place, and time.    ED Results and Treatments Labs (all labs ordered are listed, but only abnormal results are displayed) Labs Reviewed  MAGNESIUM - Abnormal; Notable for the following components:      Result Value   Magnesium 1.6 (*)    All other components within normal limits  CBC - Abnormal; Notable for the following components:   MCH 35.0 (*)    Platelets 124 (*)    All other components within normal limits  COMPREHENSIVE METABOLIC PANEL - Abnormal; Notable for the following components:   Potassium 3.2 (*)    Glucose, Bld 102 (*)    Calcium 8.3 (*)    Total Protein 6.2 (*)    Albumin 3.2 (*)    All other components within normal limits  I-STAT CHEM 8, ED - Abnormal; Notable for the following components:   Potassium 3.2 (*)    Calcium, Ion 1.07 (*)    All other components within normal limits  TROPONIN I (HIGH SENSITIVITY) - Abnormal; Notable for the following components:   Troponin I (High Sensitivity) 94 (*)    All other components within normal limits  RESP PANEL BY RT-PCR (FLU A&B, COVID) ARPGX2  BRAIN NATRIURETIC PEPTIDE  PHOSPHORUS  TSH  RAPID URINE DRUG SCREEN, HOSP PERFORMED  TROPONIN I (HIGH SENSITIVITY)                                                                                                                          EKG  EKG Interpretation  Date/Time:  Sunday May 28 2021 03:35:52 EST Ventricular Rate:  125 PR Interval:    QRS Duration: 103 QT Interval:  347 QTC Calculation: 501 R Axis:   84 Text Interpretation: Atrial fibrillation RSR' in V1 or V2, right VCD or RVH Repol abnrm suggests ischemia, diffuse leads Minimal ST elevation, lateral leads Prolonged QT interval Confirmed by Addison Lank [phone]) on 05/28/2021 4:16:56 AM       Radiology DG Chest Port 1 View  Result Date: 05/28/2021 CLINICAL DATA:  Chest pain. EXAM: PORTABLE CHEST 1 VIEW COMPARISON:  04/13/2021. FINDINGS: Heart is enlarged. Atherosclerotic calcification of the aorta is noted. The pulmonary vasculature is mildly distended. Interstitial prominence is noted bilaterally. No consolidation, effusion, or pneumothorax. No acute osseous abnormality. IMPRESSION: 1. Cardiomegaly with mildly distended pulmonary vasculature. 2. Interstitial prominence bilaterally, possible edema or infiltrate. Electronically Signed   By: Brett Fairy M.D.   On: 05/28/2021  04:12    Pertinent labs & imaging results that were available during my care of the patient were reviewed by me and considered in my medical decision making (see MDM for details).  Medications Ordered in ED Medications  sodium chloride 0.9 % bolus 1,000 mL (0 mLs Intravenous Stopped 05/28/21 0512)    And  0.9 %  sodium chloride infusion ( Intravenous Infusion Verify 05/28/21 0737)  magnesium sulfate IVPB 2 g 50 mL (has no administration in time range)  potassium chloride SA (KLOR-CON M) CR tablet 60 mEq (has no administration in time range)  umeclidinium bromide (INCRUSE ELLIPTA) 62.5 MCG/ACT 1 puff (has no administration in time range)  enoxaparin (LOVENOX) injection 40 mg (has no administration in time range)  sodium chloride flush (NS) 0.9 % injection 3 mL (has no administration in time range)  acetaminophen (TYLENOL) tablet 650 mg (has no administration in time range)     Or  acetaminophen (TYLENOL) suppository 650 mg (has no administration in time range)  albuterol (PROVENTIL) (2.5 MG/3ML) 0.083% nebulizer solution 2.5 mg (has no administration in time range)                                                                                                                                     Procedures .1-3 Lead EKG Interpretation Performed by: Fatima Blank, MD Authorized by: Fatima Blank, MD     Interpretation: abnormal     ECG rate:  134   ECG rate assessment: tachycardic     Rhythm: atrial fibrillation     Ectopy: none     Conduction: normal   .Critical Care Performed by: Fatima Blank, MD Authorized by: Fatima Blank, MD   Critical care provider statement:    Critical care time (minutes):  45   Critical care time was exclusive of:  Separately billable procedures and treating other patients   Critical care was necessary to treat or prevent imminent or life-threatening deterioration of the following conditions:  Cardiac failure   Critical care was time spent personally by me on the following activities:  Development of treatment plan with patient or surrogate, discussions with consultants, evaluation of patient's response to treatment, examination of patient, obtaining history from patient or surrogate, review of old charts, re-evaluation of patient's condition, pulse oximetry, ordering and review of radiographic studies, ordering and review of laboratory studies and ordering and performing treatments and interventions   Care discussed with: admitting provider    (including critical care time)  Medical Decision Making / ED Course        Chest pain.  Recent cocaine use.   Will need to assess for possible cocaine related chest pain/vasospasm heart failure with a last EF of 65% in May 2022 my review of records Found to be in A. fib RVR.    Unknown onset time of atrial fibrillation. Likely related to recent  cocaine use.  Will need to assess for any electrolyte derangements. Soft blood pressures likely related to A. fib RVR.  Work-up ordered to assess concerns above.  Labs and imaging independently interpreted by me and noted below: CBC without leukocytosis or anemia Mild hypokalemia and hypomagnesemia No renal insufficiency Troponin slightly elevated most likely consistent with demand ischemia from A. fib RVR Chest x-ray with evidence of possible pulmonary edema versus infection.  Management: Soft blood pressures Gentle IV fluids given history of CHF A. fib RVR Will start patient on diltiazem drip.  Slow taper and without bolus given soft blood pressures. Not a good candidate for cardioversion given the unknown specific onset time  Reassessment: Blood pressure is improving after IV fluids Rate slightly improved while on diltiazem  Patient admitted to medicine for further work-up and management   Final Clinical Impression(s) / ED Diagnoses Final diagnoses:  Atrial fibrillation with RVR ([place])  Cocaine use disorder ([place])           This chart was dictated using voice recognition software.  Despite best efforts to proofread,  errors can occur which can change the documentation meaning.    Fatima Blank, MD 05/28/21 [phone]

## 2021-05-28 NOTE — ED Notes (Signed)
Pt back in stretcher in room, on monitoring equipment and amiodarone gtt remains in place. VSS but hypotension noted. Dr Tamala Julian at bedside at this time and aware of VS.

## 2021-05-28 NOTE — Progress Notes (Signed)
Chest pain 7/10, pressure and spasm, midsternal going to mid scapular. EKG done. On Amiodarone drip. Consulted E. Chen, DO. Discontinued Amiodarone drip. No new order for chest pain. Per cardiologist note, chest pain maybe associated with previous cocaine used. Tylenol given PRN for pain. Stable vital signs. Will monitor.

## 2021-05-29 ENCOUNTER — Encounter (HOSPITAL_COMMUNITY): Payer: Self-pay | Admitting: Internal Medicine

## 2021-05-29 ENCOUNTER — Inpatient Hospital Stay (HOSPITAL_COMMUNITY): Payer: Medicaid Other

## 2021-05-29 DIAGNOSIS — R778 Other specified abnormalities of plasma proteins: Secondary | ICD-10-CM | POA: Diagnosis not present

## 2021-05-29 DIAGNOSIS — R079 Chest pain, unspecified: Secondary | ICD-10-CM | POA: Diagnosis not present

## 2021-05-29 DIAGNOSIS — I4891 Unspecified atrial fibrillation: Secondary | ICD-10-CM | POA: Diagnosis not present

## 2021-05-29 LAB — BASIC METABOLIC PANEL
Anion gap: 5 (ref 5–15)
BUN: 10 mg/dL (ref 6–20)
CO2: 31 mmol/L (ref 22–32)
Calcium: 8.4 mg/dL — ABNORMAL LOW (ref 8.9–10.3)
Chloride: 102 mmol/L (ref 98–111)
Creatinine, Ser: 0.9 mg/dL (ref 0.61–1.24)
GFR, Estimated: >60 mL/min (ref >60)
Glucose, Bld: 156 mg/dL — ABNORMAL HIGH (ref 70–99)
Potassium: 3.9 mmol/L (ref 3.5–5.1)
Sodium: 138 mmol/L (ref 135–145)

## 2021-05-29 LAB — ECHOCARDIOGRAM COMPLETE
AR max vel: 3.69 cm2
AV Area VTI: 4.01 cm2
AV Area mean vel: 3.73 cm2
AV Mean grad: 3 mmHg
AV Peak grad: 6.4 mmHg
Ao pk vel: 1.26 m/s
Area-P 1/2: 4.65 cm2
Calc EF: 56.6 %
S' Lateral: 3.4 cm
Single Plane A2C EF: 50.8 %
Single Plane A4C EF: 64 %
Weight: 3559.11 oz

## 2021-05-29 LAB — CBC
HCT: 39.9 % (ref 39.0–52.0)
Hemoglobin: 14.4 g/dL (ref 13.0–17.0)
MCH: 36.1 pg — ABNORMAL HIGH (ref 26.0–34.0)
MCHC: 36.1 g/dL — ABNORMAL HIGH (ref 30.0–36.0)
MCV: 100 fL (ref 80.0–100.0)
Platelets: 114 10*3/uL — ABNORMAL LOW (ref 150–400)
RBC: 3.99 MIL/uL — ABNORMAL LOW (ref 4.22–5.81)
RDW: 12.5 % (ref 11.5–15.5)
WBC: 5.4 10*3/uL (ref 4.0–10.5)
nRBC: 0 % (ref 0.0–0.2)

## 2021-05-29 LAB — GLUCOSE, CAPILLARY
Glucose-Capillary: 140 mg/dL — ABNORMAL HIGH (ref 70–99)
Glucose-Capillary: 142 mg/dL — ABNORMAL HIGH (ref 70–99)
Glucose-Capillary: 167 mg/dL — ABNORMAL HIGH (ref 70–99)
Glucose-Capillary: 183 mg/dL — ABNORMAL HIGH (ref 70–99)

## 2021-05-29 LAB — BRAIN NATRIURETIC PEPTIDE: B Natriuretic Peptide: 360.7 pg/mL — ABNORMAL HIGH (ref 0.0–100.0)

## 2021-05-29 LAB — MAGNESIUM: Magnesium: 2 mg/dL (ref 1.7–2.4)

## 2021-05-29 MED ORDER — FUROSEMIDE 20 MG PO TABS
20.0000 mg | ORAL_TABLET | Freq: Every day | ORAL | Status: DC
  Administered 2021-05-29: 20 mg via ORAL
  Filled 2021-05-29: qty 1

## 2021-05-29 NOTE — Progress Notes (Signed)
Mobility Specialist Progress Note    05/29/21 1529  Mobility  Activity Ambulated in hall  Level of Assistance Contact guard assist, steadying assist  Assistive Device  (hallway railing)  Distance Ambulated (ft) 120 ft  Mobility Ambulated with assistance in hallway  Mobility Response Tolerated fair  Mobility performed by Mobility specialist  $Mobility charge 1 Mobility   Pre-Mobility: 93% SpO2 During Mobility: 91% SpO2 Post-Mobility: 78 HR, 95% SpO2  Pt received in bed and agreeable. Pt needed x2 standing rest breaks to recover and felt his ability to tolerate activity decreased. Returned to bed with call bell in reach c/o not feeling well from walking. RN notified.   [facility] Mobility Specialist  M.S. 2C and 6E: [phone] M.S. 4E: [phone]

## 2021-05-29 NOTE — Progress Notes (Signed)
Heart Failure Navigator Progress Note  Assessed for Heart & Vascular TOC clinic readiness. Began interview process, pt resting in bed, flat with [place]ib[place] on, speaking in full sentences. When inquiring about SDoH, pt stated "you ask too many questions, I don't want to do this, get out".  Explained purpose of close follow up and importance of medication compliance, encouraged substance cessation. Asked patient if he has housing upon discharge, pt states yes. Wished patient well, exited room.  Pt declined HV TOC clinic appt upon DC from hospitalization. Pt educated on benefits of HV TOC. Pt education complete regarding HF patient booklet.    Navigator available for educational resources. Navigation team signed off.   Pricilla Holm, MSN, RN Heart Failure Nurse Navigator [phone]

## 2021-05-29 NOTE — Discharge Summary (Addendum)
[employer] BJY:[id number] DOB: 1965-08-19 DOA: 05/28/2021  PCP: [name], NP  Admit date: 05/28/2021 Discharge date: 05/30/21  Admitted From: home Disposition:  home  Recommendations for Outpatient Follow-up:  Follow up with PCP in 1 week Please obtain BMP/CBC in one week Please follow up  in 1 to 2 weeks     Discharge Condition:Stable CODE STATUS: Full Diet recommendation: Carb controlled heart healthy    Brief/Interim Summary: Per YQM:VHQIO [name]. is a 56 y.o. male with medical history significant of HTN, HFpEF with EF 72 -70% with grade 1 diastolic dysfunction in 01/6294, COPD, chronic hypercapnic respiratory failure, diabetes mellitus type 2, discitis/osteomyelitis, homelessness, polysubstance abuse, and OSA presents with complaints of chest pain.  Symptoms started early this morning, but admits he has been snorting cocaine over the last 3 days.  Complained of substernal chest pressure with associated symptoms of lightheadedness, shortness of breath, cough, and nausea.  He admits that in addition to snorting cocaine he also has been smoking a half a pack cigarettes per day on average.  Previously he used to drink 4-6 beers per day, but reports that he been sober for the last 2 weeks.  At baseline patient is supposed to have oxygen as needed, but states that he has been living in hotel and his machine is at his mother's house.  He has not been taking any medications at this time. ED Course: Upon admission into the emergency department patient was seen to be afebrile, pulse elevated up to 132 in atrial fibrillation, respirations 13-29, blood pressure 82/69-152/116, and O2 saturations maintained on room air.  Labs significant for platelets 124, potassium 3.2, calcium 8.3, magnesium 1.6, albumin 3.2, and high-sensitivity troponin 94.  Chest x-ray noted cardiomegaly with mildly distended pulmonary vascular and interstitial prominence bilaterally concerning for  possible edema or infiltrate.  Influenza and COVID-19 screening were negative.  Cardiology was consulted.  Patient was admitted to the hospital.   Addendum 1/10 patient was supposed to be discharged yesterday after declining work-up and medication and assistant with medications.  Upon discharge he refused to leave and felt that he was short of breath and too tired.  He was given Lasix 20 mg 1 dose yesterday and this a.m.  He is doing very foul mood refusing to take potassium and his other medications.  He has been rude to the staff and case management.  He declined assistant for medications.  He remains in sinus rhythm.  Cardiology has signed off.  Does not require oxygen on discharge.   A. fib RVR Elevated troponin Chest pain Initially started on Cardizem drip.  Then he was transitioned to amiodarone drip Now  in sinus rhythm Probably cocaine causing vasospasm causing the chest pain and elevated troponin Cath 2017 with widely patent arteries and no CAD He declined work-up and treatment yesterday.   This a.m. he was agreeable to do the echocardiogram.  No wall motion abnormalities. Avoid beta-blockers due to active cocaine use He declined assistance with medications He declined following up with A. fib clinic Not an anticoagulant candidate due to noncompliance and drug abuse No further cardiac evaluation at this time Again encouraged cocaine cessation   Hypokalemia/hypomagnesemia/hypocalcemia Was supplemented   Thrombocytopenia Has been temporized in the low-dose in the past History of alcohol use which may have contributed to this No signs or symptoms of bleeding   Hypertension BP stable     Diabetes mellitus type 2 A1c 6.3 on 03/28/2021 Not on any medications  Carb controlled Follow-up with PCP for further evaluation     Chronic respiratory failure  COPD, without acute exacerbation: At baseline patient is supposed to have 2 L of oxygen to use as needed  Was tested for  ambulatory 02 requirement prior to discharge: Patient Saturations on Room Air at Rest = 93% Patient Saturations on Room Air while Ambulating = 91% Patient did not meet criteria for 02 here.        Patient currently living in a hotel.? -Transitions of care was consulted Patient is non complaint Had declined medication assistance or medical treatment /workup during his hospitalization. He unfortunately is not engaged in his care. We offered once daily, generic medication and follow up. He states that he will probably not take any medication We have asked for afib clinic follow up for him, but appears  he will not attend based on our conversation.    Discharge Diagnoses:  Principal Problem:   Atrial fibrillation with RVR (HCC) Active Problems:   COPD (chronic obstructive pulmonary disease) (HCC)   Cocaine abuse (HCC)   Polysubstance abuse (HCC)   Hypokalemia   Hypomagnesemia   Acute on chronic diastolic CHF (congestive heart failure) (HCC)   HTN (hypertension)    Discharge Instructions  Discharge Instructions     Call MD for:  difficulty breathing, headache or visual disturbances   Complete by: As directed    Diet - low sodium heart healthy   Complete by: As directed    Diet - low sodium heart healthy   Complete by: As directed    Discharge instructions   Complete by: As directed    Follow up with pcp F/u with cardiology Stop using cocaine, cigarettes, and alcohol   Discharge instructions   Complete by: As directed    Please follow low sodium diet Stop alcohol, cocaine and cig. Smoking.   Increase activity slowly   Complete by: As directed    Increase activity slowly   Complete by: As directed       Allergies as of 05/30/2021       Reactions   Penicillins    Has tolerated cefepime 5/22 From childhood: Has patient had a PCN reaction causing immediate rash, facial/tongue/throat swelling, SOB or lightheadedness with hypotension: Unknown Has patient had a PCN  reaction causing severe rash involving mucus membranes or skin necrosis: Unknown Has patient had a PCN reaction that required hospitalization: Unknown Has patient had a PCN reaction occurring within the last 10 years: No If all of the above answers are "NO", then may proceed with Cephalosporin use.        Medication List     TAKE these medications    albuterol (2.5 MG/3ML) 0.083% nebulizer solution Commonly known as: PROVENTIL Inhale 3 mLs (2.5 mg total) into the lungs every 6 (six) hours as needed for wheezing or shortness of breath.   folic acid 1 MG tablet Commonly known as: FOLVITE Take 1 tablet (1 mg total) by mouth daily.   multivitamin with minerals Tabs tablet Take 1 tablet by mouth daily.   thiamine 100 MG tablet Take 1 tablet (100 mg total) by mouth daily.   umeclidinium bromide 62.5 MCG/ACT Aepb Commonly known as: INCRUSE ELLIPTA Inhale 1 puff into the lungs daily.        Follow-up Information     [name], NP Follow up in 1 week(s).   Specialty: Internal Medicine Contact information: [place] [address] [phone]  Buford Dresser, MD Follow up in 1 week(s).   Specialty: Cardiology Contact information: [address] [place] [address] [phone]                Allergies  Allergen Reactions   Penicillins     Has tolerated cefepime 5/22 From childhood: Has patient had a PCN reaction causing immediate rash, facial/tongue/throat swelling, SOB or lightheadedness with hypotension: Unknown Has patient had a PCN reaction causing severe rash involving mucus membranes or skin necrosis: Unknown Has patient had a PCN reaction that required hospitalization: Unknown Has patient had a PCN reaction occurring within the last 10 years: No If all of the above answers are "NO", then may proceed with Cephalosporin use.     Consultations: Cardiology   Procedures/Studies: CT Angio  Chest Pulmonary Embolism (PE) W or WO Contrast  Result Date: 05/28/2021 CLINICAL DATA:  Atrial fibrillation and chest pain. Rule out pulmonary embolus. EXAM: CT ANGIOGRAPHY CHEST WITH CONTRAST TECHNIQUE: Multidetector CT imaging of the chest was performed using the standard protocol during bolus administration of intravenous contrast. Multiplanar CT image reconstructions and MIPs were obtained to evaluate the vascular anatomy. CONTRAST:  65mL OMNIPAQUE IOHEXOL 350 MG/ML SOLN COMPARISON:  08/03/2009 FINDINGS: Cardiovascular: Satisfactory opacification of the pulmonary arteries to the segmental level. No evidence of pulmonary embolism. Heart size upper limits of normal. Aortic atherosclerosis and coronary artery calcifications. Mediastinum/Nodes: No enlarged mediastinal, hilar, or axillary lymph nodes. Thyroid gland, trachea, and esophagus demonstrate no significant findings. Lungs/Pleura: No pleural effusion, airspace consolidation, or pneumothorax. No interstitial edema identified. No suspicious lung nodules Upper Abdomen: No acute abnormality. Partially visualized cyst arising off the upper pole of left kidney measures 5.3 cm. Musculoskeletal: No chest wall abnormality. No acute or significant osseous findings. Chronic wedge deformity is identified at the T11-T12 level with ankylosis of the disc space. This is favored to represent sequelae of remote discitis/osteomyelitis. Review of the MIP images confirms the above findings. IMPRESSION: 1. No evidence for acute pulmonary embolus. 2. Chronic wedge deformity at T11-T12 with ankylosis of the disc space. This is favored to represent sequelae of remote discitis/osteomyelitis. 3. Aortic Atherosclerosis (ICD10-I70.0). Coronary artery calcifications. Electronically Signed   By: Kerby Moors M.D.   On: 05/28/2021 14:42   DG Chest Port 1 View  Result Date: 05/28/2021 CLINICAL DATA:  Chest pain. EXAM: PORTABLE CHEST 1 VIEW COMPARISON:  04/13/2021. FINDINGS: Heart is  enlarged. Atherosclerotic calcification of the aorta is noted. The pulmonary vasculature is mildly distended. Interstitial prominence is noted bilaterally. No consolidation, effusion, or pneumothorax. No acute osseous abnormality. IMPRESSION: 1. Cardiomegaly with mildly distended pulmonary vasculature. 2. Interstitial prominence bilaterally, possible edema or infiltrate. Electronically Signed   By: Brett Fairy M.D.   On: 05/28/2021 04:12   ECHOCARDIOGRAM COMPLETE  Result Date: 05/29/2021    ECHOCARDIOGRAM REPORT   Patient Name:   [name]. Date of Exam: 05/29/2021 Medical Rec #:  [id number]                 Height:       68.0 in Accession #:    [id number]                Weight:       222.4 lb Date of Birth:  Aug 14, 1965                 BSA:          2.138 m Patient Age:    8 years  BP:           147/88 mmHg Patient Gender: M                         HR:           66 bpm. Exam Location:  Inpatient Procedure: 2D Echo, Cardiac Doppler and Color Doppler Indications:    elev. troponin  History:        Patient has prior history of Echocardiogram examinations, most                 recent 09/23/2020. CHF, COPD; Risk Factors:Hypertension and                 Diabetes.  Sonographer:    Beryle Beams Referring Phys: [id number] [place]  1. Left ventricular ejection fraction, by estimation, is 55 to 60%. The left ventricle has normal function. The left ventricle has no regional wall motion abnormalities. There is moderate left ventricular hypertrophy. Left ventricular diastolic parameters are consistent with Grade II diastolic dysfunction (pseudonormalization). Elevated left atrial pressure.  2. Right ventricular systolic function is normal. The right ventricular size is mildly enlarged. There is mildly elevated pulmonary artery systolic pressure. The estimated right ventricular systolic pressure is 12.8 mmHg.  3. Left atrial size was moderately dilated.  4. The mitral valve is normal in  structure. No evidence of mitral valve regurgitation. No evidence of mitral stenosis.  5. The aortic valve was not well visualized. Aortic valve regurgitation is not visualized. Aortic valve sclerosis/calcification is present, without any evidence of aortic stenosis.  6. The inferior vena cava is dilated in size with >50% respiratory variability, suggesting right atrial pressure of 8 mmHg. FINDINGS  Left Ventricle: Left ventricular ejection fraction, by estimation, is 55 to 60%. The left ventricle has normal function. The left ventricle has no regional wall motion abnormalities. The left ventricular internal cavity size was normal in size. There is  moderate left ventricular hypertrophy. Left ventricular diastolic parameters are consistent with Grade II diastolic dysfunction (pseudonormalization). Elevated left atrial pressure. Right Ventricle: The right ventricular size is mildly enlarged. No increase in right ventricular wall thickness. Right ventricular systolic function is normal. There is mildly elevated pulmonary artery systolic pressure. The tricuspid regurgitant velocity is 2.71 m/s, and with an assumed right atrial pressure of 8 mmHg, the estimated right ventricular systolic pressure is 78.6 mmHg. Left Atrium: Left atrial size was moderately dilated. Right Atrium: Right atrial size was normal in size. Pericardium: There is no evidence of pericardial effusion. Presence of epicardial fat layer. Mitral Valve: The mitral valve is normal in structure. No evidence of mitral valve regurgitation. No evidence of mitral valve stenosis. Tricuspid Valve: The tricuspid valve is normal in structure. Tricuspid valve regurgitation is trivial. Aortic Valve: The aortic valve was not well visualized. Aortic valve regurgitation is not visualized. Aortic valve sclerosis/calcification is present, without any evidence of aortic stenosis. Aortic valve mean gradient measures 3.0 mmHg. Aortic valve peak gradient measures 6.4 mmHg.  Aortic valve area, by VTI measures 4.01 cm. Pulmonic Valve: The pulmonic valve was not well visualized. Pulmonic valve regurgitation is not visualized. Aorta: The aortic root and ascending aorta are structurally normal, with no evidence of dilitation. Venous: The inferior vena cava is dilated in size with greater than 50% respiratory variability, suggesting right atrial pressure of 8 mmHg. IAS/Shunts: The interatrial septum was not well visualized.  LEFT VENTRICLE PLAX 2D LVIDd:  5.10 cm      Diastology LVIDs:         3.40 cm      LV e' medial:    5.44 cm/s LV PW:         1.50 cm      LV E/e' medial:  14.9 LV IVS:        1.50 cm      LV e' lateral:   3.48 cm/s LVOT diam:     2.30 cm      LV E/e' lateral: 23.3 LV SV:         81 LV SV Index:   38 LVOT Area:     4.15 cm  LV Volumes (MOD) LV vol d, MOD A2C: 86.5 ml LV vol d, MOD A4C: 104.0 ml LV vol s, MOD A2C: 42.6 ml LV vol s, MOD A4C: 37.4 ml LV SV MOD A2C:     43.9 ml LV SV MOD A4C:     104.0 ml LV SV MOD BP:      54.8 ml RIGHT VENTRICLE         IVC TAPSE (M-mode): 2.3 cm  IVC diam: 1.70 cm LEFT ATRIUM              Index        RIGHT ATRIUM           Index LA diam:        4.90 cm  2.29 cm/m   RA Area:     16.30 cm LA Vol (A2C):   104.0 ml 48.64 ml/m  RA Volume:   41.20 ml  19.27 ml/m LA Vol (A4C):   72.2 ml  33.77 ml/m LA Biplane Vol: 88.1 ml  41.20 ml/m  AORTIC VALVE                    PULMONIC VALVE AV Area (Vmax):    3.69 cm     PV Vmax:       0.51 m/s AV Area (Vmean):   3.73 cm     PV Vmean:      32.300 cm/s AV Area (VTI):     4.01 cm     PV VTI:        0.079 m AV Vmax:           126.00 cm/s  PV Peak grad:  1.0 mmHg AV Vmean:          80.400 cm/s  PV Mean grad:  0.0 mmHg AV VTI:            0.203 m AV Peak Grad:      6.4 mmHg AV Mean Grad:      3.0 mmHg LVOT Vmax:         112.00 cm/s LVOT Vmean:        72.200 cm/s LVOT VTI:          0.196 m LVOT/AV VTI ratio: 0.97  AORTA Ao Root diam: 3.70 cm Ao Asc diam:  3.50 cm MITRAL VALVE                TRICUSPID VALVE MV Area (PHT): 4.65 cm    TR Peak grad:   29.4 mmHg MV Decel Time: 163 msec    TR Mean grad:   20.0 mmHg MV E velocity: 81.00 cm/s  TR Vmax:        271.00 cm/s MV A velocity: 77.60 cm/s  TR Vmean:       205.0 cm/s  MV E/A ratio:  1.04                            SHUNTS                            Systemic VTI:  0.20 m                            Systemic Diam: 2.30 cm Oswaldo Milian MD Electronically signed by Oswaldo Milian MD Signature Date/Time: 05/29/2021/5:09:40 PM    Final       Subjective: C/o being tired and sleepy.   Discharge Exam: Vitals:   05/29/21 2010 05/30/21 0617  BP: (!) 163/94 (!) 187/99  Pulse: 80 69  Resp: 20 18  Temp: 99.3 F (37.4 C) 98.4 F (36.9 C)  SpO2: 97% 98%   Vitals:   05/29/21 0838 05/29/21 0938 05/29/21 2010 05/30/21 0617  BP: (!) 141/83 (!) 147/88 (!) 163/94 (!) 187/99  Pulse:   80 69  Resp:   20 18  Temp:   99.3 F (37.4 C) 98.4 F (36.9 C)  TempSrc:   Oral Oral  SpO2:   97% 98%  Weight:    98.5 kg    General: Pt is alert, awake, not in acute distress Cardiovascular: RRR, S1/S2 +, no rubs, no gallops Respiratory: CTA bilaterally, no wheezing, no rhonchi Abdominal: Soft, NT, ND, bowel sounds + Extremities: no edema, no cyanosis    The results of significant diagnostics from this hospitalization (including imaging, microbiology, ancillary and laboratory) are listed below for reference.     Microbiology: Recent Results (from the past 240 hour(s))  Resp Panel by RT-PCR (Flu A&B, Covid) Nasopharyngeal Swab     Status: None   Collection Time: 05/28/21  4:44 AM   Specimen: Nasopharyngeal Swab; Nasopharyngeal(NP) swabs in vial transport medium  Result Value Ref Range Status   SARS Coronavirus 2 by RT PCR NEGATIVE NEGATIVE Final    Comment: (NOTE) SARS-CoV-2 target nucleic acids are NOT DETECTED.  The SARS-CoV-2 RNA is generally detectable in upper respiratory specimens during the acute phase of infection. The  lowest concentration of SARS-CoV-2 viral copies this assay can detect is 138 copies/mL. A negative result does not preclude SARS-Cov-2 infection and should not be used as the sole basis for treatment or other patient management decisions. A negative result may occur with  improper specimen collection/handling, submission of specimen other than nasopharyngeal swab, presence of viral mutation(s) within the areas targeted by this assay, and inadequate number of viral copies(<138 copies/mL). A negative result must be combined with clinical observations, patient history, and epidemiological information. The expected result is Negative.  Fact Sheet for Patients:  EntrepreneurPulse.com.au  Fact Sheet for Healthcare Providers:  IncredibleEmployment.be  This test is no t yet approved or cleared by the [place] FDA and  has been authorized for detection and/or diagnosis of SARS-CoV-2 by FDA under an Emergency Use Authorization (EUA). This EUA will remain  in effect (meaning this test can be used) for the duration of the COVID-19 declaration under Section 564(b)(1) of the Act, 21 U.S.C.section 360bbb-3(b)(1), unless the authorization is terminated  or revoked sooner.       Influenza A by PCR NEGATIVE NEGATIVE Final   Influenza B by PCR NEGATIVE NEGATIVE Final    Comment: (NOTE) The Xpert Xpress SARS-CoV-2/FLU/RSV plus assay is intended as  an aid in the diagnosis of influenza from Nasopharyngeal swab specimens and should not be used as a sole basis for treatment. Nasal washings and aspirates are unacceptable for Xpert Xpress SARS-CoV-2/FLU/RSV testing.  Fact Sheet for Patients: EntrepreneurPulse.com.au  Fact Sheet for Healthcare Providers: IncredibleEmployment.be  This test is not yet approved or cleared by the [place] FDA and has been authorized for detection and/or diagnosis of SARS-CoV-2 by FDA under  an Emergency Use Authorization (EUA). This EUA will remain in effect (meaning this test can be used) for the duration of the COVID-19 declaration under Section 564(b)(1) of the Act, 21 U.S.C. section 360bbb-3(b)(1), unless the authorization is terminated or revoked.  Performed at [facility], [address] [place], [place] [address]      Labs: BNP (last 3 results) Recent Labs    04/13/21 2100 05/28/21 0729 05/29/21 1757  BNP 378.0* 345.0* 604.5*   Basic Metabolic Panel: Recent Labs  Lab 05/28/21 0347 05/28/21 0524 05/28/21 1230 05/28/21 1242 05/29/21 0312 05/30/21 0558  NA 136 138  --  138 138  --   K 3.2* 3.2*  --  3.8 3.9 3.7  CL 101 99  --   --  102  --   CO2 27  --   --   --  31  --   GLUCOSE 102* 97  --   --  156*  --   BUN 12 13  --   --  10  --   CREATININE 0.73 0.80  --   --  0.90 0.76  CALCIUM 8.3*  --   --   --  8.4*  --   MG 1.6*  --   --   --  2.0  --   PHOS  --   --  3.6  --   --   --    Liver Function Tests: Recent Labs  Lab 05/28/21 0347  AST 31  ALT 39  ALKPHOS 65  BILITOT 1.1  PROT 6.2*  ALBUMIN 3.2*   No results for input(s): LIPASE, AMYLASE in the last 168 hours. No results for input(s): AMMONIA in the last 168 hours. CBC: Recent Labs  Lab 05/28/21 0347 05/28/21 0524 05/28/21 1242 05/29/21 0312 05/30/21 0558  WBC 7.3  --   --  5.4 6.2  HGB 14.9 14.3 12.9* 14.4 14.7  HCT 42.2 42.0 38.0* 39.9 41.2  MCV 99.1  --   --  100.0 100.0  PLT 124*  --   --  114* 134*   Cardiac Enzymes: No results for input(s): CKTOTAL, CKMB, CKMBINDEX, TROPONINI in the last 168 hours. BNP: Invalid input(s): POCBNP CBG: Recent Labs  Lab 05/29/21 0751 05/29/21 1139 05/29/21 1632 05/29/21 2129 05/30/21 0840  GLUCAP 140* 142* 167* 183* 195*   D-Dimer No results for input(s): DDIMER in the last 72 hours. Hgb A1c Recent Labs    05/28/21 1230  HGBA1C 6.1*   Lipid Profile No results for input(s): CHOL, HDL, LDLCALC, TRIG, CHOLHDL,  LDLDIRECT in the last 72 hours. Thyroid function studies Recent Labs    05/28/21 0733  TSH 2.169   Anemia work up No results for input(s): VITAMINB12, FOLATE, FERRITIN, TIBC, IRON, RETICCTPCT in the last 72 hours. Urinalysis    Component Value Date/Time   COLORURINE YELLOW 06/22/2020 [place] 06/22/2020 1123   LABSPEC 1.009 06/22/2020 1123   PHURINE 6.0 06/22/2020 1123   GLUCOSEU NEGATIVE 06/22/2020 1123   HGBUR SMALL (A) 06/22/2020 1123   BILIRUBINUR  NEGATIVE 06/22/2020 [place] 06/22/2020 1123   PROTEINUR NEGATIVE 06/22/2020 1123   UROBILINOGEN 1.0 10/16/2010 0217   NITRITE NEGATIVE 06/22/2020 1123   LEUKOCYTESUR NEGATIVE 06/22/2020 1123   Sepsis Labs Invalid input(s): PROCALCITONIN,  WBC,  LACTICIDVEN Microbiology Recent Results (from the past 240 hour(s))  Resp Panel by RT-PCR (Flu A&B, Covid) Nasopharyngeal Swab     Status: None   Collection Time: 05/28/21  4:44 AM   Specimen: Nasopharyngeal Swab; Nasopharyngeal(NP) swabs in vial transport medium  Result Value Ref Range Status   SARS Coronavirus 2 by RT PCR NEGATIVE NEGATIVE Final    Comment: (NOTE) SARS-CoV-2 target nucleic acids are NOT DETECTED.  The SARS-CoV-2 RNA is generally detectable in upper respiratory specimens during the acute phase of infection. The lowest concentration of SARS-CoV-2 viral copies this assay can detect is 138 copies/mL. A negative result does not preclude SARS-Cov-2 infection and should not be used as the sole basis for treatment or other patient management decisions. A negative result may occur with  improper specimen collection/handling, submission of specimen other than nasopharyngeal swab, presence of viral mutation(s) within the areas targeted by this assay, and inadequate number of viral copies(<138 copies/mL). A negative result must be combined with clinical observations, patient history, and epidemiological information. The expected result is  Negative.  Fact Sheet for Patients:  EntrepreneurPulse.com.au  Fact Sheet for Healthcare Providers:  IncredibleEmployment.be  This test is no t yet approved or cleared by the [place] FDA and  has been authorized for detection and/or diagnosis of SARS-CoV-2 by FDA under an Emergency Use Authorization (EUA). This EUA will remain  in effect (meaning this test can be used) for the duration of the COVID-19 declaration under Section 564(b)(1) of the Act, 21 U.S.C.section 360bbb-3(b)(1), unless the authorization is terminated  or revoked sooner.       Influenza A by PCR NEGATIVE NEGATIVE Final   Influenza B by PCR NEGATIVE NEGATIVE Final    Comment: (NOTE) The Xpert Xpress SARS-CoV-2/FLU/RSV plus assay is intended as an aid in the diagnosis of influenza from Nasopharyngeal swab specimens and should not be used as a sole basis for treatment. Nasal washings and aspirates are unacceptable for Xpert Xpress SARS-CoV-2/FLU/RSV testing.  Fact Sheet for Patients: EntrepreneurPulse.com.au  Fact Sheet for Healthcare Providers: IncredibleEmployment.be  This test is not yet approved or cleared by the [place] FDA and has been authorized for detection and/or diagnosis of SARS-CoV-2 by FDA under an Emergency Use Authorization (EUA). This EUA will remain in effect (meaning this test can be used) for the duration of the COVID-19 declaration under Section 564(b)(1) of the Act, 21 U.S.C. section 360bbb-3(b)(1), unless the authorization is terminated or revoked.  Performed at [facility], [place] [address], [place], [place] [address]      Time coordinating discharge: Over 30 minutes  SIGNED:   Nolberto Hanlon, MD  [facility] 05/30/2021, 9:43 AM Pager   If 7PM-7AM, please contact night-coverage [email or web address] Password [email or web address]

## 2021-05-29 NOTE — Plan of Care (Signed)

## 2021-05-29 NOTE — Progress Notes (Signed)
Primary Cardiologist:  Harrell Gave   Subjective:  Denies SSCP, palpitations or Dyspnea   Objective:  Vitals:   05/28/21 1940 05/29/21 0000 05/29/21 0519 05/29/21 0627  BP: (!) 132/91 116/71 122/68   Pulse: 64 (!) 59 (!) 54   Resp: 19  19   Temp: 98.1 F (36.7 C) 97.7 F (36.5 C) 97.8 F (36.6 C)   TempSrc: Oral Axillary Axillary   SpO2: 99% 98% 95%   Weight:    100.9 kg    Intake/Output from previous day:  Intake/Output Summary (Last 24 hours) at 05/29/2021 0813 Last data filed at 05/28/2021 2000 Gross per 24 hour  Intake 727.44 ml  Output 250 ml  Net 477.44 ml    Physical Exam: Poor dentition  No mumur Basilar atelectasis Multiple tattoos  Trace edema   Lab Results: Basic Metabolic Panel: Recent Labs    05/28/21 0347 05/28/21 0524 05/28/21 1230 05/28/21 1242 05/29/21 0312  NA 136 138  --  138 138  K 3.2* 3.2*  --  3.8 3.9  CL 101 99  --   --  102  CO2 27  --   --   --  31  GLUCOSE 102* 97  --   --  156*  BUN 12 13  --   --  10  CREATININE 0.73 0.80  --   --  0.90  CALCIUM 8.3*  --   --   --  8.4*  MG 1.6*  --   --   --  2.0  PHOS  --   --  3.6  --   --    Liver Function Tests: Recent Labs    05/28/21 0347  AST 31  ALT 39  ALKPHOS 65  BILITOT 1.1  PROT 6.2*  ALBUMIN 3.2*   No results for input(s): LIPASE, AMYLASE in the last 72 hours. CBC: Recent Labs    05/28/21 0347 05/28/21 0524 05/28/21 1242 05/29/21 0312  WBC 7.3  --   --  5.4  HGB 14.9   < > 12.9* 14.4  HCT 42.2   < > 38.0* 39.9  MCV 99.1  --   --  100.0  PLT 124*  --   --  114*   < > = values in this interval not displayed.    Hemoglobin A1C: Recent Labs    05/28/21 1230  HGBA1C 6.1*    Thyroid Function Tests: Recent Labs    05/28/21 0733  TSH 2.169     Imaging: CT Angio Chest Pulmonary Embolism (PE) W or WO Contrast  Result Date: 05/28/2021 CLINICAL DATA:  Atrial fibrillation and chest pain. Rule out pulmonary embolus. EXAM: CT ANGIOGRAPHY CHEST WITH  CONTRAST TECHNIQUE: Multidetector CT imaging of the chest was performed using the standard protocol during bolus administration of intravenous contrast. Multiplanar CT image reconstructions and MIPs were obtained to evaluate the vascular anatomy. CONTRAST:  71mL OMNIPAQUE IOHEXOL 350 MG/ML SOLN COMPARISON:  08/03/2009 FINDINGS: Cardiovascular: Satisfactory opacification of the pulmonary arteries to the segmental level. No evidence of pulmonary embolism. Heart size upper limits of normal. Aortic atherosclerosis and coronary artery calcifications. Mediastinum/Nodes: No enlarged mediastinal, hilar, or axillary lymph nodes. Thyroid gland, trachea, and esophagus demonstrate no significant findings. Lungs/Pleura: No pleural effusion, airspace consolidation, or pneumothorax. No interstitial edema identified. No suspicious lung nodules Upper Abdomen: No acute abnormality. Partially visualized cyst arising off the upper pole of left kidney measures 5.3 cm. Musculoskeletal: No chest wall abnormality. No acute or significant osseous findings. Chronic wedge  deformity is identified at the T11-T12 level with ankylosis of the disc space. This is favored to represent sequelae of remote discitis/osteomyelitis. Review of the MIP images confirms the above findings. IMPRESSION: 1. No evidence for acute pulmonary embolus. 2. Chronic wedge deformity at T11-T12 with ankylosis of the disc space. This is favored to represent sequelae of remote discitis/osteomyelitis. 3. Aortic Atherosclerosis (ICD10-I70.0). Coronary artery calcifications. Electronically Signed   By: Kerby Moors M.D.   On: 05/28/2021 14:42   DG Chest Port 1 View  Result Date: 05/28/2021 CLINICAL DATA:  Chest pain. EXAM: PORTABLE CHEST 1 VIEW COMPARISON:  04/13/2021. FINDINGS: Heart is enlarged. Atherosclerotic calcification of the aorta is noted. The pulmonary vasculature is mildly distended. Interstitial prominence is noted bilaterally. No consolidation, effusion, or  pneumothorax. No acute osseous abnormality. IMPRESSION: 1. Cardiomegaly with mildly distended pulmonary vasculature. 2. Interstitial prominence bilaterally, possible edema or infiltrate. Electronically Signed   By: Brett Fairy M.D.   On: 05/28/2021 04:12    Cardiac Studies:  ECG: SR rate 57 ICRBBB LVH    Telemetry:  NSR 05/29/2021   Echo: EF 65-70%   Medications:    enoxaparin (LOVENOX) injection  40 mg Subcutaneous R94V   folic acid  1 mg Oral Daily   insulin aspart  0-9 Units Subcutaneous TID WC   multivitamin with minerals  1 tablet Oral Daily   sodium chloride flush  3 mL Intravenous Q12H   thiamine  100 mg Oral Daily   Or   thiamine  100 mg Intravenous Daily      Assessment/Plan:  Mr. Batdorf is a 56 yo man with PMH polysubstance abuse, hypertension, chronic diastolic heart failure who presents 1/8 with chest pain and elevated troponin. He was in atrial fibrillation on arrival and converted to sinus rhythm while on amiodarone drip  PAF:  NSR this am not a candidate for anticoagulation due to non compliance and drug use  Polysubstance abuse. Cocaine use active not much motivation to quit use or f/u with medical care  DM:  Discussed low carb diet.  Target hemoglobin A1c is 6.5 or less.  Continue current medications.A1c 6.1   Jenkins Rouge 05/29/2021, 8:13 AM

## 2021-05-29 NOTE — Progress Notes (Signed)
° °  Echocardiogram 2D Echocardiogram has been performed.  Beryle Beams 05/29/2021, 2:20 PM

## 2021-05-29 NOTE — Progress Notes (Addendum)
CSW met with patients at bedside. Patient reports he comes from apt alone. CSW offered patient resources for outpatient substance use treatment services. Patient accepted. CSW offered patient area shelter resources if needed. Patient accepted. CSW offered patient bus pass. Patient accepted. Patient reports he plans on taking bus at dc.All questions answered. No further questions reported at this time.

## 2021-05-29 NOTE — Progress Notes (Signed)
SATURATION QUALIFICATIONS: (This note is used to comply with regulatory documentation for home oxygen)  Patient Saturations on Room Air at Rest = 93%  Patient Saturations on Room Air while Ambulating = 91%   Please briefly explain why patient needs home oxygen: No oxygenation needed.

## 2021-05-30 DIAGNOSIS — I4891 Unspecified atrial fibrillation: Secondary | ICD-10-CM | POA: Diagnosis not present

## 2021-05-30 LAB — CBC
HCT: 41.2 % (ref 39.0–52.0)
Hemoglobin: 14.7 g/dL (ref 13.0–17.0)
MCH: 35.7 pg — ABNORMAL HIGH (ref 26.0–34.0)
MCHC: 35.7 g/dL (ref 30.0–36.0)
MCV: 100 fL (ref 80.0–100.0)
Platelets: 134 10*3/uL — ABNORMAL LOW (ref 150–400)
RBC: 4.12 MIL/uL — ABNORMAL LOW (ref 4.22–5.81)
RDW: 12.3 % (ref 11.5–15.5)
WBC: 6.2 10*3/uL (ref 4.0–10.5)
nRBC: 0 % (ref 0.0–0.2)

## 2021-05-30 LAB — POTASSIUM: Potassium: 3.7 mmol/L (ref 3.5–5.1)

## 2021-05-30 LAB — CREATININE, SERUM
Creatinine, Ser: 0.76 mg/dL (ref 0.61–1.24)
GFR, Estimated: >60 mL/min (ref >60)

## 2021-05-30 LAB — GLUCOSE, CAPILLARY: Glucose-Capillary: 195 mg/dL — ABNORMAL HIGH (ref 70–99)

## 2021-05-30 MED ORDER — POTASSIUM CHLORIDE CRYS ER 10 MEQ PO TBCR: 10.0000 meq | EXTENDED_RELEASE_TABLET | Freq: Two times a day (BID) | ORAL | Status: DC

## 2021-05-30 MED ORDER — FUROSEMIDE 20 MG PO TABS
20.0000 mg | ORAL_TABLET | Freq: Every day | ORAL | Status: DC
  Filled 2021-05-30: qty 1

## 2021-05-30 NOTE — Progress Notes (Signed)
Discharge instructions (including medications) discussed with and copy provided to patient/caregiver 

## 2021-05-30 NOTE — Progress Notes (Signed)
Primary Cardiologist:  Harrell Gave   Subjective:  Upset and yelling on phone about missing bond    Objective:  Vitals:   05/29/21 0838 05/29/21 0938 05/29/21 2010 05/30/21 0617  BP: (!) 141/83 (!) 147/88 (!) 163/94 (!) 187/99  Pulse:   80 69  Resp:   20 18  Temp:   99.3 F (37.4 C) 98.4 F (36.9 C)  TempSrc:   Oral Oral  SpO2:   97% 98%  Weight:    98.5 kg    Intake/Output from previous day:  Intake/Output Summary (Last 24 hours) at 05/30/2021 0815 Last data filed at 05/30/2021 0100 Gross per 24 hour  Intake 720 ml  Output --  Net 720 ml    Physical Exam: Poor dentition  No mumur Basilar atelectasis Multiple tattoos  Trace edema   Lab Results: Basic Metabolic Panel: Recent Labs    05/28/21 0347 05/28/21 0524 05/28/21 1230 05/28/21 1242 05/29/21 0312 05/30/21 0558  NA 136 138  --  138 138  --   K 3.2* 3.2*  --  3.8 3.9 3.7  CL 101 99  --   --  102  --   CO2 27  --   --   --  31  --   GLUCOSE 102* 97  --   --  156*  --   BUN 12 13  --   --  10  --   CREATININE 0.73 0.80  --   --  0.90 0.76  CALCIUM 8.3*  --   --   --  8.4*  --   MG 1.6*  --   --   --  2.0  --   PHOS  --   --  3.6  --   --   --    Liver Function Tests: Recent Labs    05/28/21 0347  AST 31  ALT 39  ALKPHOS 65  BILITOT 1.1  PROT 6.2*  ALBUMIN 3.2*   No results for input(s): LIPASE, AMYLASE in the last 72 hours. CBC: Recent Labs    05/29/21 0312 05/30/21 0558  WBC 5.4 6.2  HGB 14.4 14.7  HCT 39.9 41.2  MCV 100.0 100.0  PLT 114* 134*    Hemoglobin A1C: Recent Labs    05/28/21 1230  HGBA1C 6.1*    Thyroid Function Tests: Recent Labs    05/28/21 0733  TSH 2.169     Imaging: CT Angio Chest Pulmonary Embolism (PE) W or WO Contrast  Result Date: 05/28/2021 CLINICAL DATA:  Atrial fibrillation and chest pain. Rule out pulmonary embolus. EXAM: CT ANGIOGRAPHY CHEST WITH CONTRAST TECHNIQUE: Multidetector CT imaging of the chest was performed using the standard  protocol during bolus administration of intravenous contrast. Multiplanar CT image reconstructions and MIPs were obtained to evaluate the vascular anatomy. CONTRAST:  61mL OMNIPAQUE IOHEXOL 350 MG/ML SOLN COMPARISON:  08/03/2009 FINDINGS: Cardiovascular: Satisfactory opacification of the pulmonary arteries to the segmental level. No evidence of pulmonary embolism. Heart size upper limits of normal. Aortic atherosclerosis and coronary artery calcifications. Mediastinum/Nodes: No enlarged mediastinal, hilar, or axillary lymph nodes. Thyroid gland, trachea, and esophagus demonstrate no significant findings. Lungs/Pleura: No pleural effusion, airspace consolidation, or pneumothorax. No interstitial edema identified. No suspicious lung nodules Upper Abdomen: No acute abnormality. Partially visualized cyst arising off the upper pole of left kidney measures 5.3 cm. Musculoskeletal: No chest wall abnormality. No acute or significant osseous findings. Chronic wedge deformity is identified at the T11-T12 level with ankylosis of the disc space. This  is favored to represent sequelae of remote discitis/osteomyelitis. Review of the MIP images confirms the above findings. IMPRESSION: 1. No evidence for acute pulmonary embolus. 2. Chronic wedge deformity at T11-T12 with ankylosis of the disc space. This is favored to represent sequelae of remote discitis/osteomyelitis. 3. Aortic Atherosclerosis (ICD10-I70.0). Coronary artery calcifications. Electronically Signed   By: Kerby Moors M.D.   On: 05/28/2021 14:42   ECHOCARDIOGRAM COMPLETE  Result Date: 05/29/2021    ECHOCARDIOGRAM REPORT   Patient Name:   [name]. Date of Exam: 05/29/2021 Medical Rec #:  [id number]                 Height:       68.0 in Accession #:    [id number]                Weight:       222.4 lb Date of Birth:  January 03, 1966                 BSA:          2.138 m Patient Age:    56 years                  BP:           147/88 mmHg Patient Gender: M                          HR:           66 bpm. Exam Location:  Inpatient Procedure: 2D Echo, Cardiac Doppler and Color Doppler Indications:    elev. troponin  History:        Patient has prior history of Echocardiogram examinations, most                 recent 09/23/2020. CHF, COPD; Risk Factors:Hypertension and                 Diabetes.  Sonographer:    Beryle Beams Referring Phys: [id number] [place]  1. Left ventricular ejection fraction, by estimation, is 55 to 60%. The left ventricle has normal function. The left ventricle has no regional wall motion abnormalities. There is moderate left ventricular hypertrophy. Left ventricular diastolic parameters are consistent with Grade II diastolic dysfunction (pseudonormalization). Elevated left atrial pressure.  2. Right ventricular systolic function is normal. The right ventricular size is mildly enlarged. There is mildly elevated pulmonary artery systolic pressure. The estimated right ventricular systolic pressure is 62.9 mmHg.  3. Left atrial size was moderately dilated.  4. The mitral valve is normal in structure. No evidence of mitral valve regurgitation. No evidence of mitral stenosis.  5. The aortic valve was not well visualized. Aortic valve regurgitation is not visualized. Aortic valve sclerosis/calcification is present, without any evidence of aortic stenosis.  6. The inferior vena cava is dilated in size with >50% respiratory variability, suggesting right atrial pressure of 8 mmHg. FINDINGS  Left Ventricle: Left ventricular ejection fraction, by estimation, is 55 to 60%. The left ventricle has normal function. The left ventricle has no regional wall motion abnormalities. The left ventricular internal cavity size was normal in size. There is  moderate left ventricular hypertrophy. Left ventricular diastolic parameters are consistent with Grade II diastolic dysfunction (pseudonormalization). Elevated left atrial pressure. Right Ventricle: The right  ventricular size is mildly enlarged. No increase in right ventricular wall thickness. Right ventricular systolic function is normal. There is mildly elevated pulmonary artery  systolic pressure. The tricuspid regurgitant velocity is 2.71 m/s, and with an assumed right atrial pressure of 8 mmHg, the estimated right ventricular systolic pressure is 62.8 mmHg. Left Atrium: Left atrial size was moderately dilated. Right Atrium: Right atrial size was normal in size. Pericardium: There is no evidence of pericardial effusion. Presence of epicardial fat layer. Mitral Valve: The mitral valve is normal in structure. No evidence of mitral valve regurgitation. No evidence of mitral valve stenosis. Tricuspid Valve: The tricuspid valve is normal in structure. Tricuspid valve regurgitation is trivial. Aortic Valve: The aortic valve was not well visualized. Aortic valve regurgitation is not visualized. Aortic valve sclerosis/calcification is present, without any evidence of aortic stenosis. Aortic valve mean gradient measures 3.0 mmHg. Aortic valve peak gradient measures 6.4 mmHg. Aortic valve area, by VTI measures 4.01 cm. Pulmonic Valve: The pulmonic valve was not well visualized. Pulmonic valve regurgitation is not visualized. Aorta: The aortic root and ascending aorta are structurally normal, with no evidence of dilitation. Venous: The inferior vena cava is dilated in size with greater than 50% respiratory variability, suggesting right atrial pressure of 8 mmHg. IAS/Shunts: The interatrial septum was not well visualized.  LEFT VENTRICLE PLAX 2D LVIDd:         5.10 cm      Diastology LVIDs:         3.40 cm      LV e' medial:    5.44 cm/s LV PW:         1.50 cm      LV E/e' medial:  14.9 LV IVS:        1.50 cm      LV e' lateral:   3.48 cm/s LVOT diam:     2.30 cm      LV E/e' lateral: 23.3 LV SV:         81 LV SV Index:   38 LVOT Area:     4.15 cm  LV Volumes (MOD) LV vol d, MOD A2C: 86.5 ml LV vol d, MOD A4C: 104.0 ml LV vol  s, MOD A2C: 42.6 ml LV vol s, MOD A4C: 37.4 ml LV SV MOD A2C:     43.9 ml LV SV MOD A4C:     104.0 ml LV SV MOD BP:      54.8 ml RIGHT VENTRICLE         IVC TAPSE (M-mode): 2.3 cm  IVC diam: 1.70 cm LEFT ATRIUM              Index        RIGHT ATRIUM           Index LA diam:        4.90 cm  2.29 cm/m   RA Area:     16.30 cm LA Vol (A2C):   104.0 ml 48.64 ml/m  RA Volume:   41.20 ml  19.27 ml/m LA Vol (A4C):   72.2 ml  33.77 ml/m LA Biplane Vol: 88.1 ml  41.20 ml/m  AORTIC VALVE                    PULMONIC VALVE AV Area (Vmax):    3.69 cm     PV Vmax:       0.51 m/s AV Area (Vmean):   3.73 cm     PV Vmean:      32.300 cm/s AV Area (VTI):     4.01 cm     PV VTI:  0.079 m AV Vmax:           126.00 cm/s  PV Peak grad:  1.0 mmHg AV Vmean:          80.400 cm/s  PV Mean grad:  0.0 mmHg AV VTI:            0.203 m AV Peak Grad:      6.4 mmHg AV Mean Grad:      3.0 mmHg LVOT Vmax:         112.00 cm/s LVOT Vmean:        72.200 cm/s LVOT VTI:          0.196 m LVOT/AV VTI ratio: 0.97  AORTA Ao Root diam: 3.70 cm Ao Asc diam:  3.50 cm MITRAL VALVE               TRICUSPID VALVE MV Area (PHT): 4.65 cm    TR Peak grad:   29.4 mmHg MV Decel Time: 163 msec    TR Mean grad:   20.0 mmHg MV E velocity: 81.00 cm/s  TR Vmax:        271.00 cm/s MV A velocity: 77.60 cm/s  TR Vmean:       205.0 cm/s MV E/A ratio:  1.04                            SHUNTS                            Systemic VTI:  0.20 m                            Systemic Diam: 2.30 cm Oswaldo Milian MD Electronically signed by Oswaldo Milian MD Signature Date/Time: 05/29/2021/5:09:40 PM    Final     Cardiac Studies:  ECG: SR rate 57 ICRBBB LVH    Telemetry:  NSR 05/30/2021   Echo: EF 65-70%   Medications:    enoxaparin (LOVENOX) injection  40 mg Subcutaneous Z61W   folic acid  1 mg Oral Daily   furosemide  20 mg Oral Daily   insulin aspart  0-9 Units Subcutaneous TID WC   multivitamin with minerals  1 tablet Oral Daily   sodium chloride  flush  3 mL Intravenous Q12H   thiamine  100 mg Oral Daily   Or   thiamine  100 mg Intravenous Daily      Assessment/Plan:  Mr. Reinard is a 56 yo man with PMH polysubstance abuse, hypertension, chronic diastolic heart failure who presents 1/8 with chest pain and elevated troponin. He was in atrial fibrillation on arrival and converted to sinus rhythm while on amiodarone drip  PAF:  NSR this am not a candidate for anticoagulation due to non compliance and drug use  Polysubstance abuse. Cocaine use active not much motivation to quit use or f/u with medical care  DM:  Discussed low carb diet.  Target hemoglobin A1c is 6.5 or less.  Continue current medications.A1c 6.1   Cardiology will sign off   Jenkins Rouge 05/30/2021, 8:15 AM

## 2021-05-30 NOTE — Progress Notes (Signed)
PT after learning of pending discharge he had refused all meds. PT stated "just take these fucking things off of me and I'll be on my way". PT did report to myself and Dr. Kurtis Bushman that "I don't feel right". Only new order was for PO potassium which PT was refusing all other meds or assessments.PT has appeared in no outward distress this morning. SW to arrange transportation for PT. Discharge instructions had been given and PIVs removed.

## 2021-05-30 NOTE — TOC Transition Note (Addendum)
Transition of Care [facility]) - CM/SW Discharge Note   Patient Details  Name: [name]. MRN: [id number] Date of Birth: 1966/02/14  [facility] CM/SW Contact:  Milas Gain, [place] Phone Number: 05/30/2021, 10:22 AM   Clinical Narrative:        CSW consult to assist patient with transport. Patient reports he is unable to dc to his mothers. CSW offered assist with finding a shelter for patient. Patient says he does not want to go to a shelter. CSW offered transportation to the [facility] to assist with patient getting resources in the community. Patient declined but still wants assist with transportation.CSW offered patient Bus pass. Patient accepted.All questions answered. No further questions reported at this time.     Patient Goals and CMS Choice        Discharge Placement                       Discharge Plan and Services                                     Social Determinants of Health (SDOH) Interventions     Readmission Risk Interventions Readmission Risk Prevention Plan 10/10/2020 06/27/2020 10/21/2019  Transportation Screening Complete Complete Complete  PCP or Specialist Appt within 3-5 Days - - Complete  [place]RI or [place] - - Complete  Social Work Consult for [place] Planning/Counseling - - Complete  Palliative Care Screening - - Complete  Medication Review Press photographer) Complete Complete Complete  PCP or Specialist appointment within 3-5 days of discharge Complete Complete -  [place] or Home Care Consult Complete Complete -  SW Recovery Care/Counseling Consult Complete Complete -  Palliative Care Screening Not Applicable Not Applicable -  [place] Not Applicable Not Applicable -  Some recent data might be hidden

## 2021-05-30 NOTE — Plan of Care (Signed)
°  Problem: Education: Goal: Ability to demonstrate management of disease process will improve Outcome: Adequate for Discharge Goal: Ability to verbalize understanding of medication therapies will improve Outcome: Adequate for Discharge Goal: Individualized Educational Video(s) Outcome: Adequate for Discharge

## 2021-05-31 ENCOUNTER — Telehealth: Payer: Self-pay

## 2021-05-31 NOTE — Telephone Encounter (Signed)
Transition Care Management Unsuccessful Follow-up Telephone Call  Date of discharge and from where:  05/30/2021-[place]  Attempts:  1st Attempt  Reason for unsuccessful TCM follow-up call:  Unable to leave message

## 2021-05-31 NOTE — Telephone Encounter (Signed)
Transition Care Management Follow-up Telephone Call Date of discharge and from where: 05/30/2021, [facility] How have you been since you were released from the hospital? He said he was in [place] and could not talk and agreed to call this CM back when he could talk and hung up. He was reached at # [phone]. Prior to reaching the patient, this CM reached his mother # [phone]. She said he was not with her and she was not sure where he was and she is not sure how he is doing. She said has no place to stay and has no ID. Instructed her to have him contact/visit [facility].

## 2021-06-01 NOTE — Telephone Encounter (Signed)
Transition Care Management Unsuccessful Follow-up Telephone Call  Date of discharge and from where:  05/30/2021 from [facility]  Attempts:  2nd Attempt  Reason for unsuccessful TCM follow-up call:  Left voice message

## 2021-06-07 ENCOUNTER — Emergency Department (HOSPITAL_COMMUNITY): Payer: Medicaid Other

## 2021-06-07 ENCOUNTER — Other Ambulatory Visit: Payer: Self-pay

## 2021-06-07 ENCOUNTER — Emergency Department (HOSPITAL_COMMUNITY)
Admission: EM | Admit: 2021-06-07 | Discharge: 2021-06-07 | Disposition: A | Discharge status: Home / Self Care | Payer: Medicaid Other | Attending: Emergency Medicine | Admitting: Emergency Medicine

## 2021-06-07 DIAGNOSIS — Z20822 Contact with and (suspected) exposure to covid-19: Secondary | ICD-10-CM | POA: Insufficient documentation

## 2021-06-07 DIAGNOSIS — R0602 Shortness of breath: Secondary | ICD-10-CM | POA: Diagnosis not present

## 2021-06-07 DIAGNOSIS — R0902 Hypoxemia: Secondary | ICD-10-CM | POA: Diagnosis not present

## 2021-06-07 DIAGNOSIS — I4891 Unspecified atrial fibrillation: Secondary | ICD-10-CM | POA: Diagnosis not present

## 2021-06-07 DIAGNOSIS — R059 Cough, unspecified: Secondary | ICD-10-CM | POA: Diagnosis not present

## 2021-06-07 DIAGNOSIS — R079 Chest pain, unspecified: Secondary | ICD-10-CM | POA: Diagnosis not present

## 2021-06-07 DIAGNOSIS — R0789 Other chest pain: Secondary | ICD-10-CM | POA: Diagnosis not present

## 2021-06-07 DIAGNOSIS — F17210 Nicotine dependence, cigarettes, uncomplicated: Secondary | ICD-10-CM | POA: Insufficient documentation

## 2021-06-07 LAB — CBC WITH DIFFERENTIAL/PLATELET
Abs Immature Granulocytes: 0.03 10*3/uL (ref 0.00–0.07)
Basophils Absolute: 0 10*3/uL (ref 0.0–0.1)
Basophils Relative: 1 %
Eosinophils Absolute: 0.2 10*3/uL (ref 0.0–0.5)
Eosinophils Relative: 3 %
HCT: 42.9 % (ref 39.0–52.0)
Hemoglobin: 15.1 g/dL (ref 13.0–17.0)
Immature Granulocytes: 1 %
Lymphocytes Relative: 32 %
Lymphs Abs: 2.1 10*3/uL (ref 0.7–4.0)
MCH: 35.7 pg — ABNORMAL HIGH (ref 26.0–34.0)
MCHC: 35.2 g/dL (ref 30.0–36.0)
MCV: 101.4 fL — ABNORMAL HIGH (ref 80.0–100.0)
Monocytes Absolute: 0.4 10*3/uL (ref 0.1–1.0)
Monocytes Relative: 6 %
Neutro Abs: 3.7 10*3/uL (ref 1.7–7.7)
Neutrophils Relative %: 57 %
Platelets: 194 10*3/uL (ref 150–400)
RBC: 4.23 MIL/uL (ref 4.22–5.81)
RDW: 13.2 % (ref 11.5–15.5)
WBC: 6.4 10*3/uL (ref 4.0–10.5)
nRBC: 0 % (ref 0.0–0.2)

## 2021-06-07 LAB — BASIC METABOLIC PANEL
Anion gap: 7 (ref 5–15)
BUN: 9 mg/dL (ref 6–20)
CO2: 30 mmol/L (ref 22–32)
Calcium: 8.6 mg/dL — ABNORMAL LOW (ref 8.9–10.3)
Chloride: 100 mmol/L (ref 98–111)
Creatinine, Ser: 0.82 mg/dL (ref 0.61–1.24)
GFR, Estimated: >60 mL/min (ref >60)
Glucose, Bld: 122 mg/dL — ABNORMAL HIGH (ref 70–99)
Potassium: 3.6 mmol/L (ref 3.5–5.1)
Sodium: 137 mmol/L (ref 135–145)

## 2021-06-07 LAB — RESP PANEL BY RT-PCR (FLU A&B, COVID) ARPGX2
Influenza A by PCR: NEGATIVE
Influenza B by PCR: NEGATIVE
SARS Coronavirus 2 by RT PCR: NEGATIVE

## 2021-06-07 LAB — TROPONIN I (HIGH SENSITIVITY)
Troponin I (High Sensitivity): 22 ng/L — ABNORMAL HIGH (ref <18)
Troponin I (High Sensitivity): 19 ng/L — ABNORMAL HIGH (ref <18)

## 2021-06-07 LAB — BRAIN NATRIURETIC PEPTIDE: B Natriuretic Peptide: 288.4 pg/mL — ABNORMAL HIGH (ref 0.0–100.0)

## 2021-06-07 MED ORDER — DEXAMETHASONE SODIUM PHOSPHATE 10 MG/ML IJ SOLN
10.0000 mg | Freq: Once | INTRAMUSCULAR | Status: AC
Start: 2021-06-07 — End: 2021-06-07
  Administered 2021-06-07: 10 mg via INTRAMUSCULAR
  Filled 2021-06-07: qty 1

## 2021-06-07 MED ORDER — ALBUTEROL SULFATE HFA 108 (90 BASE) MCG/ACT IN AERS
2.0000 | INHALATION_SPRAY | Freq: Four times a day (QID) | RESPIRATORY_TRACT | Status: DC
  Administered 2021-06-07: 2 via RESPIRATORY_TRACT
  Filled 2021-06-07: qty 6.7

## 2021-06-07 NOTE — Progress Notes (Signed)
CSW contacted the [facility] in [place] who has no record of patient having a legal guardian. An all search was also completed and no record was found.

## 2021-06-07 NOTE — ED Triage Notes (Signed)
Pt brought to ED with c/o ShOB and chest tightness x2 hrs. Pt reports recently being homeless and has been outside all night.

## 2021-06-07 NOTE — ED Provider Notes (Signed)
[facility] EMERGENCY DEPARTMENT Provider Note   CSN: [id number] Arrival date & time: 06/07/21  0502     History  Chief Complaint  Patient presents with   Shortness of Breath    [name]. is a 56 y.o. male.  HPI Patient presents with difficulty breathing, chest tightness.  Onset was within the past few days, though exact time is unclear.  Patient notes history of recent hospitalization for chest pain, acknowledges ongoing cocaine use, cigarette use, homelessness, all challenges for his ongoing health management.  He notes that he is also having difficulty obtaining medication for relief.  He seemingly denies fever, pain beyond his chest.    Home Medications Prior to Admission medications   Medication Sig Start Date End Date Taking? Authorizing Provider  albuterol (PROVENTIL) (2.5 MG/3ML) 0.083% nebulizer solution Inhale 3 mLs (2.5 mg total) into the lungs every 6 (six) hours as needed for wheezing or shortness of breath. Patient not taking: Reported on 05/28/2021 04/14/21   [name], DO  folic acid (FOLVITE) 1 MG tablet Take 1 tablet (1 mg total) by mouth daily. Patient not taking: Reported on 04/14/2021 03/31/21   [name], DO  Multiple Vitamin (MULTIVITAMIN WITH MINERALS) TABS tablet Take 1 tablet by mouth daily. Patient not taking: Reported on 04/14/2021 03/31/21   [name], DO  thiamine 100 MG tablet Take 1 tablet (100 mg total) by mouth daily. Patient not taking: Reported on 04/14/2021 03/31/21   [name], DO  umeclidinium bromide (INCRUSE ELLIPTA) 62.5 MCG/ACT AEPB Inhale 1 puff into the lungs daily. Patient not taking: Reported on 05/28/2021 04/15/21   [name], DO      Allergies    Penicillins    Review of Systems   Review of Systems  Constitutional:        Per HPI, otherwise negative  HENT:         Per HPI, otherwise negative  Respiratory:         Per HPI, otherwise negative  Cardiovascular:         Per HPI, otherwise negative  Gastrointestinal:  Negative for vomiting.  Endocrine:       Negative aside from HPI  Genitourinary:        Neg aside from HPI   Musculoskeletal:        Per HPI, otherwise negative  Skin: Negative.   Neurological:  Negative for syncope.   Physical Exam Updated Vital Signs BP 140/85 (BP Location: Right Arm)    Pulse 65    Temp 98 F (36.7 C) (Oral)    Resp 18    SpO2 96%  Physical Exam Vitals and nursing note reviewed.  Constitutional:      General: He is not in acute distress.    Appearance: He is well-developed.  HENT:     Head: Normocephalic and atraumatic.  Eyes:     Conjunctiva/sclera: Conjunctivae normal.  Cardiovascular:     Rate and Rhythm: Normal rate and regular rhythm.  Pulmonary:     Effort: Pulmonary effort is normal. No respiratory distress.     Breath sounds: No stridor.  Abdominal:     General: There is no distension.  Skin:    General: Skin is warm and dry.  Neurological:     Mental Status: He is alert and oriented to person, place, and time.    ED Results / Procedures / Treatments   Labs (all labs ordered are listed, but only abnormal results are displayed) Labs  Reviewed  CBC WITH DIFFERENTIAL/PLATELET - Abnormal; Notable for the following components:      Result Value   MCV 101.4 (*)    MCH 35.7 (*)    All other components within normal limits  BASIC METABOLIC PANEL - Abnormal; Notable for the following components:   Glucose, Bld 122 (*)    Calcium 8.6 (*)    All other components within normal limits  BRAIN NATRIURETIC PEPTIDE - Abnormal; Notable for the following components:   B Natriuretic Peptide 288.4 (*)    All other components within normal limits  TROPONIN I (HIGH SENSITIVITY) - Abnormal; Notable for the following components:   Troponin I (High Sensitivity) 22 (*)    All other components within normal limits  RESP PANEL BY RT-PCR (FLU A&B, COVID) ARPGX2  TROPONIN I (HIGH SENSITIVITY)    EKG EKG  Interpretation  Date/Time:  Wednesday June 07 2021 05:07:33 EST Ventricular Rate:  68 PR Interval:  146 QRS Duration: 96 QT Interval:  492 QTC Calculation: 523 R Axis:   84 Text Interpretation: Sinus rhythm with Premature supraventricular complexes Incomplete right bundle branch block Nonspecific ST abnormality Prolonged QT Artifact Abnormal ECG When compared with ECG of 28-May-2021 20:00, PREVIOUS ECG IS PRESENT Confirmed by Carmin Muskrat [phone]) on 06/07/2021 7:15:41 AM  Radiology DG Chest 2 View  Result Date: 06/07/2021 CLINICAL DATA:  56 year old male with chest pain and shortness of breath. Smoker. EXAM: CHEST - 2 VIEW COMPARISON:  CTA chest 05/28/2021 and earlier. FINDINGS: Somewhat decreased lung volumes since 2010 with questionable subpleural pulmonary scarring on the CT earlier this month. Although increased AP dimension to the chest since 2010. Mediastinal contours are stable and within normal limits; mild tortuosity of the thoracic aorta. Visualized tracheal air column is within normal limits. No pneumothorax, pulmonary edema, pleural effusion or confluent pulmonary opacity. Chronic but increased pulmonary interstitial markings since 2010. No acute osseous abnormality identified. Chronic distal right clavicle deformity. Chronic lower thoracic wedge compression fractures. Negative visible bowel gas. IMPRESSION: No acute cardiopulmonary abnormality. Suspect smoking related chronic lung disease. Electronically Signed   By: Genevie Ann M.D.   On: 06/07/2021 05:44    Procedures Procedures    Medications Ordered in ED Medications  albuterol (VENTOLIN HFA) 108 (90 Base) MCG/ACT inhaler 2 puff (2 puffs Inhalation Given 06/07/21 0752)  dexamethasone (DECADRON) injection 10 mg (10 mg Intramuscular Given 06/07/21 9326)    ED Course/ Medical Decision Making/ A&P   EMS rhythm strip reviewed, notable for sinus rhythm, PAC, rate 58, wander otherwise unremarkable.  Reviewed the patient's chart,  notable for elevated troponin, rapid A. fib few weeks ago requiring hospitalization, following which the patient was discharged, with ongoing medication management.                           Medical Decision Making Risk Prescription drug management.   Adult male with multiple medical issues including recent NSTEMI, A. fib in the context of ongoing homelessness, cocaine and cigarette use presents with chest pain.  Here his findings are generally reassuring in comparison with recent hospitalization.  EKG nonischemic, x-ray interpreted, no pneumonia, pneumothorax, substantial cardiomegaly.  2 normal troponins and absence of distress all reassuring for low suspicion for ongoing ACS, as above other findings reassuring as well.  Patient's challenge of homelessness, cocaine and cigarette addiction acknowledged, social work consulted.  With generally reassuring finding some suspicion for his dyspnea being secondary to cigarette addiction, patient started on appropriate  medication, discharged to follow-up in our clinic.  Notably, the patient has previously been designated as having a legal guardian, there is no documentation of this over the past few years I discussed this with social work and this Public librarian was removed.        Final Clinical Impression(s) / ED Diagnoses Final diagnoses:  SOB (shortness of breath)      Carmin Muskrat, MD 06/07/21 1014

## 2021-06-07 NOTE — ED Notes (Signed)
Pt verbalized understanding of d/c instructions, meds and followup care. Denies questions. VSS, no distress noted. Steady gait to exit with all belongings.  ?

## 2021-06-07 NOTE — ED Provider Triage Note (Signed)
Emergency Medicine Provider Triage Evaluation Note  [facility] [name]. , a 56 y.o. male  was evaluated in triage.  Pt complains of SOB and chest tightness x 2 hours.  Does admit to cough with phlegm.  Currently homeless and has been outside for a few hours.  Hx of COPD, AFIB, CHF, polysubstance abuse.    Review of Systems  Positive: SOB Negative: fever  Physical Exam  BP (!) 131/91    Pulse (!) 58    Temp 97.9 F (36.6 C) (Oral)    Resp 20    SpO2 95%   Gen:   Awake, no distress   Resp:  Normal effort  MSK:   Moves extremities without difficulty  Other:    Medical Decision Making  Medically screening exam initiated at 5:01 AM.  Appropriate orders placed.  [employer]. was informed that the remainder of the evaluation will be completed by another provider, this initial triage assessment does not replace that evaluation, and the importance of remaining in the ED until their evaluation is complete.  Chest pain, SOB, with cough.  VSS.  EKG, labs, covid/flu screen, CXR.   Larene Pickett, PA-C 06/07/21 0507

## 2021-06-07 NOTE — Discharge Instructions (Signed)
As discussed, your shortness of breath is likely due to your smoking history, and possibly exacerbated by your use of cocaine.  Today's evaluation has been generally reassuring, has no evidence for substantial abnormality such as those he experienced last time.  Please follow-up at the [facility] for assistance with your medications as needed.

## 2021-06-08 ENCOUNTER — Telehealth: Payer: Self-pay

## 2021-06-08 NOTE — Telephone Encounter (Signed)
Transition Care Management Unsuccessful Follow-up Telephone Call  Date of discharge and from where:  06/07/2020 from [facility]  Attempts:  1st Attempt  Reason for unsuccessful TCM follow-up call:  Left voice message

## 2021-06-09 NOTE — Telephone Encounter (Signed)
Transition Care Management Unsuccessful Follow-up Telephone Call  Date of discharge and from where:  06/07/2020 from [facility]  Attempts:  2nd Attempt  Reason for unsuccessful TCM follow-up call:  Left voice message

## 2021-06-12 NOTE — Telephone Encounter (Signed)
Transition Care Management Unsuccessful Follow-up Telephone Call  Date of discharge and from where:  06/07/2021-[place]   Attempts:  3rd Attempt  Reason for unsuccessful TCM follow-up call:  Left voice message

## 2021-06-13 ENCOUNTER — Ambulatory Visit (HOSPITAL_COMMUNITY): Payer: Medicaid Other | Admitting: Physician Assistant

## 2021-06-13 ENCOUNTER — Encounter (HOSPITAL_COMMUNITY): Payer: Self-pay

## 2021-06-21 ENCOUNTER — Emergency Department (HOSPITAL_COMMUNITY): Payer: Medicaid Other

## 2021-06-21 ENCOUNTER — Other Ambulatory Visit: Payer: Self-pay

## 2021-06-21 ENCOUNTER — Encounter (HOSPITAL_COMMUNITY): Payer: Self-pay | Admitting: Emergency Medicine

## 2021-06-21 ENCOUNTER — Inpatient Hospital Stay (HOSPITAL_COMMUNITY)
Admission: EM | Admit: 2021-06-21 | Discharge: 2021-06-26 | DRG: 988 | Disposition: A | Discharge status: Home / Self Care | Payer: Medicaid Other | Attending: Family Medicine | Admitting: Family Medicine

## 2021-06-21 DIAGNOSIS — W19XXXA Unspecified fall, initial encounter: Secondary | ICD-10-CM | POA: Diagnosis not present

## 2021-06-21 DIAGNOSIS — E669 Obesity, unspecified: Secondary | ICD-10-CM | POA: Diagnosis present

## 2021-06-21 DIAGNOSIS — I480 Paroxysmal atrial fibrillation: Secondary | ICD-10-CM | POA: Diagnosis present

## 2021-06-21 DIAGNOSIS — Z91199 Patient's noncompliance with other medical treatment and regimen due to unspecified reason: Secondary | ICD-10-CM

## 2021-06-21 DIAGNOSIS — M7989 Other specified soft tissue disorders: Secondary | ICD-10-CM | POA: Diagnosis not present

## 2021-06-21 DIAGNOSIS — Z809 Family history of malignant neoplasm, unspecified: Secondary | ICD-10-CM

## 2021-06-21 DIAGNOSIS — J449 Chronic obstructive pulmonary disease, unspecified: Secondary | ICD-10-CM | POA: Diagnosis present

## 2021-06-21 DIAGNOSIS — L03114 Cellulitis of left upper limb: Secondary | ICD-10-CM

## 2021-06-21 DIAGNOSIS — I10 Essential (primary) hypertension: Secondary | ICD-10-CM | POA: Diagnosis not present

## 2021-06-21 DIAGNOSIS — R609 Edema, unspecified: Secondary | ICD-10-CM | POA: Diagnosis not present

## 2021-06-21 DIAGNOSIS — L03012 Cellulitis of left finger: Secondary | ICD-10-CM

## 2021-06-21 DIAGNOSIS — I110 Hypertensive heart disease with heart failure: Secondary | ICD-10-CM | POA: Diagnosis present

## 2021-06-21 DIAGNOSIS — E119 Type 2 diabetes mellitus without complications: Secondary | ICD-10-CM | POA: Diagnosis present

## 2021-06-21 DIAGNOSIS — I5032 Chronic diastolic (congestive) heart failure: Secondary | ICD-10-CM | POA: Diagnosis present

## 2021-06-21 DIAGNOSIS — Z20822 Contact with and (suspected) exposure to covid-19: Secondary | ICD-10-CM | POA: Diagnosis present

## 2021-06-21 DIAGNOSIS — Z8249 Family history of ischemic heart disease and other diseases of the circulatory system: Secondary | ICD-10-CM

## 2021-06-21 DIAGNOSIS — L02512 Cutaneous abscess of left hand: Principal | ICD-10-CM | POA: Diagnosis present

## 2021-06-21 DIAGNOSIS — Z5900 Homelessness unspecified: Secondary | ICD-10-CM

## 2021-06-21 DIAGNOSIS — F17210 Nicotine dependence, cigarettes, uncomplicated: Secondary | ICD-10-CM | POA: Diagnosis present

## 2021-06-21 DIAGNOSIS — F1410 Cocaine abuse, uncomplicated: Secondary | ICD-10-CM | POA: Diagnosis present

## 2021-06-21 DIAGNOSIS — G8929 Other chronic pain: Secondary | ICD-10-CM | POA: Diagnosis present

## 2021-06-21 DIAGNOSIS — Z880 Allergy status to penicillin: Secondary | ICD-10-CM

## 2021-06-21 DIAGNOSIS — Z6833 Body mass index (BMI) 33.0-33.9, adult: Secondary | ICD-10-CM

## 2021-06-21 DIAGNOSIS — F1010 Alcohol abuse, uncomplicated: Secondary | ICD-10-CM | POA: Diagnosis present

## 2021-06-21 DIAGNOSIS — R001 Bradycardia, unspecified: Secondary | ICD-10-CM | POA: Diagnosis not present

## 2021-06-21 DIAGNOSIS — D7589 Other specified diseases of blood and blood-forming organs: Secondary | ICD-10-CM | POA: Diagnosis present

## 2021-06-21 DIAGNOSIS — M545 Low back pain, unspecified: Secondary | ICD-10-CM | POA: Diagnosis present

## 2021-06-21 DIAGNOSIS — F1490 Cocaine use, unspecified, uncomplicated: Secondary | ICD-10-CM | POA: Diagnosis present

## 2021-06-21 DIAGNOSIS — L03011 Cellulitis of right finger: Secondary | ICD-10-CM

## 2021-06-21 LAB — CBC WITH DIFFERENTIAL/PLATELET
Abs Immature Granulocytes: 0.03 10*3/uL (ref 0.00–0.07)
Basophils Absolute: 0 10*3/uL (ref 0.0–0.1)
Basophils Relative: 0 %
Eosinophils Absolute: 0.1 10*3/uL (ref 0.0–0.5)
Eosinophils Relative: 1 %
HCT: 45.9 % (ref 39.0–52.0)
Hemoglobin: 16.1 g/dL (ref 13.0–17.0)
Immature Granulocytes: 0 %
Lymphocytes Relative: 13 %
Lymphs Abs: 0.9 10*3/uL (ref 0.7–4.0)
MCH: 35.5 pg — ABNORMAL HIGH (ref 26.0–34.0)
MCHC: 35.1 g/dL (ref 30.0–36.0)
MCV: 101.1 fL — ABNORMAL HIGH (ref 80.0–100.0)
Monocytes Absolute: 0.6 10*3/uL (ref 0.1–1.0)
Monocytes Relative: 8 %
Neutro Abs: 5.4 10*3/uL (ref 1.7–7.7)
Neutrophils Relative %: 78 %
Platelets: 168 10*3/uL (ref 150–400)
RBC: 4.54 MIL/uL (ref 4.22–5.81)
RDW: 14.4 % (ref 11.5–15.5)
WBC: 7 10*3/uL (ref 4.0–10.5)
nRBC: 0 % (ref 0.0–0.2)

## 2021-06-21 LAB — COMPREHENSIVE METABOLIC PANEL
ALT: 27 U/L (ref 0–44)
AST: 25 U/L (ref 15–41)
Albumin: 3.7 g/dL (ref 3.5–5.0)
Alkaline Phosphatase: 66 U/L (ref 38–126)
Anion gap: 8 (ref 5–15)
BUN: 11 mg/dL (ref 6–20)
CO2: 26 mmol/L (ref 22–32)
Calcium: 9 mg/dL (ref 8.9–10.3)
Chloride: 99 mmol/L (ref 98–111)
Creatinine, Ser: 0.65 mg/dL (ref 0.61–1.24)
GFR, Estimated: >60 mL/min (ref >60)
Glucose, Bld: 157 mg/dL — ABNORMAL HIGH (ref 70–99)
Potassium: 3.5 mmol/L (ref 3.5–5.1)
Sodium: 133 mmol/L — ABNORMAL LOW (ref 135–145)
Total Bilirubin: 1.6 mg/dL — ABNORMAL HIGH (ref 0.3–1.2)
Total Protein: 7.5 g/dL (ref 6.5–8.1)

## 2021-06-21 LAB — GLUCOSE, CAPILLARY: Glucose-Capillary: 111 mg/dL — ABNORMAL HIGH (ref 70–99)

## 2021-06-21 LAB — RESP PANEL BY RT-PCR (FLU A&B, COVID) ARPGX2
Influenza A by PCR: NEGATIVE
Influenza B by PCR: NEGATIVE
SARS Coronavirus 2 by RT PCR: NEGATIVE

## 2021-06-21 LAB — LACTIC ACID, PLASMA
Lactic Acid, Venous: 0.9 mmol/L (ref 0.5–1.9)
Lactic Acid, Venous: 1.7 mmol/L (ref 0.5–1.9)

## 2021-06-21 MED ORDER — MORPHINE SULFATE (PF) 2 MG/ML IV SOLN: 1.0000 mg | INTRAVENOUS | Status: DC | PRN

## 2021-06-21 MED ORDER — SENNOSIDES-DOCUSATE SODIUM 8.6-50 MG PO TABS: 1.0000 | ORAL_TABLET | Freq: Every evening | ORAL | Status: DC | PRN

## 2021-06-21 MED ORDER — ONDANSETRON HCL 4 MG PO TABS: 4.0000 mg | ORAL_TABLET | Freq: Four times a day (QID) | ORAL | Status: DC | PRN

## 2021-06-21 MED ORDER — ACETAMINOPHEN 500 MG PO TABS
1000.0000 mg | ORAL_TABLET | Freq: Four times a day (QID) | ORAL | Status: DC | PRN
  Administered 2021-06-21 – 2021-06-24 (×4): 1000 mg via ORAL
  Filled 2021-06-21 (×4): qty 2

## 2021-06-21 MED ORDER — INSULIN ASPART 100 UNIT/ML IJ SOLN
0.0000 [IU] | Freq: Three times a day (TID) | INTRAMUSCULAR | Status: DC
  Administered 2021-06-23: 5 [IU] via SUBCUTANEOUS
  Administered 2021-06-23: 2 [IU] via SUBCUTANEOUS
  Administered 2021-06-24: 1 [IU] via SUBCUTANEOUS
  Filled 2021-06-21: qty 0.09

## 2021-06-21 MED ORDER — HEPARIN SODIUM (PORCINE) 5000 UNIT/ML IJ SOLN
5000.0000 [IU] | Freq: Three times a day (TID) | INTRAMUSCULAR | Status: DC
  Administered 2021-06-21 – 2021-06-25 (×8): 5000 [IU] via SUBCUTANEOUS
  Filled 2021-06-21 (×12): qty 1

## 2021-06-21 MED ORDER — VANCOMYCIN HCL IN DEXTROSE 1-5 GM/200ML-% IV SOLN: 1000.0000 mg | Freq: Two times a day (BID) | INTRAVENOUS | Status: DC

## 2021-06-21 MED ORDER — ONDANSETRON HCL 4 MG/2ML IJ SOLN: 4.0000 mg | Freq: Four times a day (QID) | INTRAMUSCULAR | Status: DC | PRN

## 2021-06-21 MED ORDER — SODIUM CHLORIDE 0.9 % IV SOLN
1.0000 g | INTRAVENOUS | Status: DC
  Administered 2021-06-22 – 2021-06-23 (×2): 1 g via INTRAVENOUS
  Filled 2021-06-21 (×3): qty 10

## 2021-06-21 MED ORDER — CEFTRIAXONE SODIUM 1 G IJ SOLR
1.0000 g | Freq: Once | INTRAMUSCULAR | Status: AC
  Administered 2021-06-21: 1 g via INTRAVENOUS
  Filled 2021-06-21: qty 10

## 2021-06-21 MED ORDER — MORPHINE SULFATE (PF) 4 MG/ML IV SOLN
4.0000 mg | Freq: Once | INTRAVENOUS | Status: AC
  Administered 2021-06-21: 4 mg via INTRAVENOUS
  Filled 2021-06-21: qty 1

## 2021-06-21 MED ORDER — VANCOMYCIN HCL 1250 MG/250ML IV SOLN
1250.0000 mg | Freq: Two times a day (BID) | INTRAVENOUS | Status: DC
  Administered 2021-06-22 – 2021-06-24 (×5): 1250 mg via INTRAVENOUS
  Filled 2021-06-21 (×7): qty 250

## 2021-06-21 MED ORDER — ACETAMINOPHEN 650 MG RE SUPP: 650.0000 mg | Freq: Four times a day (QID) | RECTAL | Status: DC | PRN

## 2021-06-21 MED ORDER — ALBUTEROL SULFATE HFA 108 (90 BASE) MCG/ACT IN AERS: 1.0000 | INHALATION_SPRAY | RESPIRATORY_TRACT | Status: DC | PRN

## 2021-06-21 MED ORDER — ALBUTEROL SULFATE (2.5 MG/3ML) 0.083% IN NEBU: 2.5000 mg | INHALATION_SOLUTION | RESPIRATORY_TRACT | Status: DC | PRN

## 2021-06-21 MED ORDER — VANCOMYCIN HCL IN DEXTROSE 1-5 GM/200ML-% IV SOLN
1000.0000 mg | Freq: Once | INTRAVENOUS | Status: AC
  Administered 2021-06-21: 1000 mg via INTRAVENOUS
  Filled 2021-06-21: qty 200

## 2021-06-21 NOTE — Assessment & Plan Note (Signed)
Stable without acute respiratory issue.  Continue albuterol as needed.

## 2021-06-21 NOTE — ED Provider Triage Note (Signed)
Emergency Medicine Provider Triage Evaluation Note  [facility] [name]. , a 56 y.o. male  was evaluated in triage.  Pt complains of left middle finger injury.  He reports that two days ago he fell and injured his left middle finger.  Since then the pain has spread into his left hand with swelling.  Right handed.  He reports feeling generally unwell.   Chart review shows last tdap 11/11/2017   Physical Exam  BP (!) 164/95 (BP Location: Right Arm)    Pulse 79    Temp 98 F (36.7 C)    Resp 18    Ht 5\' 8"  (1.727 m)    Wt 99.8 kg    SpO2 96%    BMI 33.45 kg/m  Gen:   Awake, no distress   Resp:  Normal effort  MSK:   Moves extremities without difficulty  Other:  Obvious edema of left hand worse around left distal long finger.  There is a wound on the distal left finger over the dorsal surface.   Medical Decision Making  Medically screening exam initiated at 1:41 PM.  Appropriate orders placed.  [employer]. was informed that the remainder of the evaluation will be completed by another provider, this initial triage assessment does not replace that evaluation, and the importance of remaining in the ED until their evaluation is complete.     Lorin Glass, [place] 06/21/21 1347

## 2021-06-21 NOTE — Assessment & Plan Note (Signed)
Placed on SSI. 

## 2021-06-21 NOTE — ED Notes (Signed)
Pt called x1 in ED lobby. Pt could not be located within ED lobby, and did not respond to name call.   °

## 2021-06-21 NOTE — Assessment & Plan Note (Signed)
Recent admit for A. fib with RVR felt secondary to cocaine use.  Appears in sinus rhythm on admission.  Not on rate, rhythm, or anticoagulation medications due to nonadherence and active drug use.

## 2021-06-21 NOTE — ED Provider Notes (Signed)
[facility] DEPT Provider Note   CSN: [id number] Arrival date & time: 06/21/21  1249     History  Chief Complaint  Patient presents with   Hand Pain   hand swelling    Randall Gip Ahmarion Saraceno. is a 56 y.o. male here presenting with left hand pain and swelling.  Patient is homeless and uses cocaine at baseline.  [name] states that [name] fell onto his left hand and also injected cocaine in the left wrist area about a week ago.  [name] states that [name] has progressive pain and swelling and redness.  Denies any purulent drainage.  Patient states start for him to close his left hand.  Patient is homeless  The history is provided by the patient.      Home Medications Prior to Admission medications   Medication Sig Start Date End Date Taking? Authorizing Provider  albuterol (PROVENTIL) (2.5 MG/3ML) 0.083% nebulizer solution Inhale 3 mLs (2.5 mg total) into the lungs every 6 (six) hours as needed for wheezing or shortness of breath. Patient not taking: Reported on 05/28/2021 04/14/21   [name], DO  folic acid (FOLVITE) 1 MG tablet Take 1 tablet (1 mg total) by mouth daily. Patient not taking: Reported on 04/14/2021 03/31/21   [name], DO  Multiple Vitamin (MULTIVITAMIN WITH MINERALS) TABS tablet Take 1 tablet by mouth daily. Patient not taking: Reported on 04/14/2021 03/31/21   [name], DO  thiamine 100 MG tablet Take 1 tablet (100 mg total) by mouth daily. Patient not taking: Reported on 04/14/2021 03/31/21   [name], DO  umeclidinium bromide (INCRUSE ELLIPTA) 62.5 MCG/ACT AEPB Inhale 1 puff into the lungs daily. Patient not taking: Reported on 05/28/2021 04/15/21   [name], DO      Allergies    Penicillins    Review of Systems   Review of Systems  Musculoskeletal:        Left hand pain  All other systems reviewed and are negative.  Physical Exam Updated Vital Signs BP (!) 159/79    Pulse 80    Temp 98 F (36.7 C)    Resp 18     Ht 5\' 8"  (1.727 m)    Wt 99.8 kg    SpO2 98%    BMI 33.45 kg/m  Physical Exam Vitals and nursing note reviewed.  Constitutional:      Comments: Disheveled  HENT:     Head: Normocephalic.     Nose: Nose normal.     Mouth/Throat:     Mouth: Mucous membranes are dry.  Eyes:     Extraocular Movements: Extraocular movements intact.     Pupils: Pupils are equal, round, and reactive to light.  Cardiovascular:     Rate and Rhythm: Normal rate and regular rhythm.     Pulses: Normal pulses.     Heart sounds: Normal heart sounds.  Pulmonary:     Effort: Pulmonary effort is normal.     Breath sounds: Normal breath sounds.  Abdominal:     General: Abdomen is flat.     Palpations: Abdomen is soft.  Musculoskeletal:     Cervical back: Normal range of motion and neck supple.     Comments: Left third finger with necrotic tissue in the distal tip area and diffuse hand swelling.  Patient has difficulty flexing the left third PIP joint.  Patient also has injection marks in the dorsal aspect of the left wrist patient does have normal capillary refill and normal radial  Skin:    General: Skin is warm.     Capillary Refill: Capillary refill takes less than 2 seconds.  Neurological:     General: No focal deficit present.     Mental Status: [name] is alert and oriented to person, place, and time.  Psychiatric:        Mood and Affect: Mood normal.        Behavior: Behavior normal.     ED Results / Procedures / Treatments   Labs (all labs ordered are listed, but only abnormal results are displayed) Labs Reviewed  COMPREHENSIVE METABOLIC PANEL - Abnormal; Notable for the following components:      Result Value   Sodium 133 (*)    Glucose, Bld 157 (*)    Total Bilirubin 1.6 (*)    All other components within normal limits  CBC WITH DIFFERENTIAL/PLATELET - Abnormal; Notable for the following components:   MCV 101.1 (*)    MCH 35.5 (*)    All other components within normal limits  RESP PANEL BY  RT-PCR (FLU A&B, COVID) ARPGX2  URINALYSIS, ROUTINE W REFLEX MICROSCOPIC    EKG None  Radiology DG Hand Complete Left  Result Date: 06/21/2021 CLINICAL DATA:  Fall pain  and swelling hand EXAM: LEFT HAND - COMPLETE 3+ VIEW COMPARISON:  10/13/2015 FINDINGS: Negative for acute fracture. Two screws in the base of the first metacarpal unchanged from the prior study. Mild arthropathy the base of the thumb. No other significant arthropathy. No erosion. IMPRESSION: Negative for fracture.  Surgical changes base of thumb. Electronically Signed   By: Franchot Gallo M.D.   On: 06/21/2021 13:55    Procedures Procedures    Medications Ordered in ED Medications - No data to display  ED Course/ Medical Decision Making/ A&P                           Medical Decision Making [facility] Emin Foree. is a 56 y.o. male here presenting with left hand pain and swelling.  Patient does inject cocaine.  I am concerned that [name] may have a fracture from the fall or [name] has flexor tendon synovitis or [name] has cellulitis.  Plan to get CBC and CMP and left hand x-ray.  We will give broad-spectrum antibiotics and consult hand surgery.  7:17 PM Labs reviewed.  His white blood cell count is normal.  I talked to Dr. Lenon Curt from hand surgery.  [name] states that [name] will see patient and recommend IV antibiotics and medicine admission.    Problems Addressed: Cellulitis of finger of right hand: acute illness or injury  Amount and/or Complexity of Data Reviewed External Data Reviewed: notes. Labs: ordered. Decision-making details documented in ED Course. Radiology: ordered and independent interpretation performed. Decision-making details documented in ED Course.  Risk Prescription drug management. Decision regarding hospitalization.  Final Clinical Impression(s) / ED Diagnoses Final diagnoses:  None    Rx / DC Orders ED Discharge Orders     None         Drenda Freeze, MD 06/21/21 [phone]

## 2021-06-21 NOTE — ED Notes (Signed)
Per Dr. Darl Householder- only one set of blood cultures is acceptable.

## 2021-06-21 NOTE — Assessment & Plan Note (Signed)
Compensated and euvolemic on admission.  TTE 05/29/2021 showed EF 55-60% with G2 DD.  Not using diuretic as an outpatient.

## 2021-06-21 NOTE — Progress Notes (Signed)
Pt answered most of questions on nursing admission history. Then became very agitated and said "What is this? Put no, no , no for everything." Unable to complete all of nursing admission hx. Lucius Conn BSN, RN-BC Admissions Nurse / Discharge Review Nurse 06/21/2021 8:45 PM

## 2021-06-21 NOTE — Assessment & Plan Note (Signed)
Patient reports ongoing intravenous cocaine use.
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
## 2021-06-21 NOTE — Progress Notes (Addendum)
Pharmacy Antibiotic Note  [name]. is a 56 y.o. male admitted on 06/21/2021 with cellulitis.  Pharmacy has been consulted for vancomycin dosing.  Plan: Vancomycin 1250mg  IV q12h for estimated AUC 471 usnig SCr 0.65 Check vancomycin levels as needed, goal AUC 400-550 Follow up renal function & cultures  Height: 5\' 8"  (172.7 cm) Weight: 99.8 kg (220 lb) IBW/kg (Calculated) : 68.4  Temp (24hrs), Avg:98 F (36.7 C), Min:98 F (36.7 C), Max:98 F (36.7 C)  Recent Labs  Lab 06/21/21 1412 06/21/21 2032  WBC 7.0  --   CREATININE 0.65  --   LATICACIDVEN  --  0.9    Estimated Creatinine Clearance: 118.1 mL/min (by C-G formula based on SCr of 0.65 mg/dL).    Allergies  Allergen Reactions   Penicillins     Has tolerated cefepime 5/22 From childhood: Has patient had a PCN reaction causing immediate rash, facial/tongue/throat swelling, SOB or lightheadedness with hypotension: Unknown Has patient had a PCN reaction causing severe rash involving mucus membranes or skin necrosis: Unknown Has patient had a PCN reaction that required hospitalization: Unknown Has patient had a PCN reaction occurring within the last 10 years: No If all of the above answers are "NO", then may proceed with Cephalosporin use.     Antimicrobials this admission: 2/1 Vancomycin >> 2/1 Ceftriaxone >>  Dose adjustments this admission:  Microbiology results: 2/1 BCx:  Thank you for allowing pharmacy to be a part of this patients care.  Peggyann Juba, PharmD, BCPS Pharmacy: [phone]' 06/21/2021 9:19 PM

## 2021-06-21 NOTE — Assessment & Plan Note (Signed)
In setting of injection drug use and apparent traumatic injury.  X-ray without evidence of fracture.  EDP discussed with on-call hand surgery who will evaluate. -Continue empiric IV vancomycin and ceftriaxone -Follow blood culture

## 2021-06-21 NOTE — Hospital Course (Signed)
[name]. is a 56 y.o. male with medical history significant for COPD, paroxysmal atrial fibrillation not on anticoagulation (due to nonadherence and active drug use), chronic diastolic CHF (EF 68-03%), homelessness, polysubstance use disorder who is admitted with left hand cellulitis.

## 2021-06-21 NOTE — ED Triage Notes (Signed)
Per EMS-complaining of left middle finger pain from fall 2 days ago-left hand swollen

## 2021-06-22 ENCOUNTER — Inpatient Hospital Stay (HOSPITAL_COMMUNITY): Payer: Medicaid Other

## 2021-06-22 ENCOUNTER — Other Ambulatory Visit: Payer: Self-pay

## 2021-06-22 ENCOUNTER — Encounter (HOSPITAL_COMMUNITY): Payer: Self-pay | Admitting: Internal Medicine

## 2021-06-22 ENCOUNTER — Encounter (HOSPITAL_COMMUNITY)
Admission: EM | Disposition: A | Discharge status: Home / Self Care | Payer: Self-pay | Source: Home / Self Care | Attending: Family Medicine

## 2021-06-22 DIAGNOSIS — Z809 Family history of malignant neoplasm, unspecified: Secondary | ICD-10-CM | POA: Diagnosis not present

## 2021-06-22 DIAGNOSIS — L03012 Cellulitis of left finger: Secondary | ICD-10-CM | POA: Diagnosis not present

## 2021-06-22 DIAGNOSIS — M545 Low back pain, unspecified: Secondary | ICD-10-CM | POA: Diagnosis present

## 2021-06-22 DIAGNOSIS — I480 Paroxysmal atrial fibrillation: Secondary | ICD-10-CM | POA: Diagnosis present

## 2021-06-22 DIAGNOSIS — M7989 Other specified soft tissue disorders: Secondary | ICD-10-CM | POA: Diagnosis not present

## 2021-06-22 DIAGNOSIS — Z880 Allergy status to penicillin: Secondary | ICD-10-CM | POA: Diagnosis not present

## 2021-06-22 DIAGNOSIS — Z5900 Homelessness unspecified: Secondary | ICD-10-CM | POA: Diagnosis not present

## 2021-06-22 DIAGNOSIS — L03011 Cellulitis of right finger: Secondary | ICD-10-CM | POA: Diagnosis present

## 2021-06-22 DIAGNOSIS — Z8249 Family history of ischemic heart disease and other diseases of the circulatory system: Secondary | ICD-10-CM | POA: Diagnosis not present

## 2021-06-22 DIAGNOSIS — F1490 Cocaine use, unspecified, uncomplicated: Secondary | ICD-10-CM | POA: Diagnosis not present

## 2021-06-22 DIAGNOSIS — J449 Chronic obstructive pulmonary disease, unspecified: Secondary | ICD-10-CM | POA: Diagnosis present

## 2021-06-22 DIAGNOSIS — I10 Essential (primary) hypertension: Secondary | ICD-10-CM | POA: Diagnosis not present

## 2021-06-22 DIAGNOSIS — D7589 Other specified diseases of blood and blood-forming organs: Secondary | ICD-10-CM | POA: Diagnosis present

## 2021-06-22 DIAGNOSIS — Z91199 Patient's noncompliance with other medical treatment and regimen due to unspecified reason: Secondary | ICD-10-CM | POA: Diagnosis not present

## 2021-06-22 DIAGNOSIS — L02512 Cutaneous abscess of left hand: Secondary | ICD-10-CM | POA: Diagnosis not present

## 2021-06-22 DIAGNOSIS — F1410 Cocaine abuse, uncomplicated: Secondary | ICD-10-CM | POA: Diagnosis present

## 2021-06-22 DIAGNOSIS — W19XXXA Unspecified fall, initial encounter: Secondary | ICD-10-CM | POA: Diagnosis present

## 2021-06-22 DIAGNOSIS — I509 Heart failure, unspecified: Secondary | ICD-10-CM | POA: Diagnosis not present

## 2021-06-22 DIAGNOSIS — E1165 Type 2 diabetes mellitus with hyperglycemia: Secondary | ICD-10-CM | POA: Diagnosis not present

## 2021-06-22 DIAGNOSIS — Z20822 Contact with and (suspected) exposure to covid-19: Secondary | ICD-10-CM | POA: Diagnosis present

## 2021-06-22 DIAGNOSIS — E669 Obesity, unspecified: Secondary | ICD-10-CM | POA: Diagnosis present

## 2021-06-22 DIAGNOSIS — E119 Type 2 diabetes mellitus without complications: Secondary | ICD-10-CM | POA: Diagnosis present

## 2021-06-22 DIAGNOSIS — F17210 Nicotine dependence, cigarettes, uncomplicated: Secondary | ICD-10-CM | POA: Diagnosis present

## 2021-06-22 DIAGNOSIS — I110 Hypertensive heart disease with heart failure: Secondary | ICD-10-CM | POA: Diagnosis not present

## 2021-06-22 DIAGNOSIS — L03114 Cellulitis of left upper limb: Secondary | ICD-10-CM | POA: Diagnosis not present

## 2021-06-22 DIAGNOSIS — G8929 Other chronic pain: Secondary | ICD-10-CM | POA: Diagnosis present

## 2021-06-22 DIAGNOSIS — F1010 Alcohol abuse, uncomplicated: Secondary | ICD-10-CM | POA: Diagnosis present

## 2021-06-22 DIAGNOSIS — I5032 Chronic diastolic (congestive) heart failure: Secondary | ICD-10-CM | POA: Diagnosis present

## 2021-06-22 DIAGNOSIS — Z6833 Body mass index (BMI) 33.0-33.9, adult: Secondary | ICD-10-CM | POA: Diagnosis not present

## 2021-06-22 HISTORY — PX: INCISION AND DRAINAGE OF WOUND: SHX1803

## 2021-06-22 LAB — CBC
HCT: 44.1 % (ref 39.0–52.0)
Hemoglobin: 15 g/dL (ref 13.0–17.0)
MCH: 34.9 pg — ABNORMAL HIGH (ref 26.0–34.0)
MCHC: 34 g/dL (ref 30.0–36.0)
MCV: 102.6 fL — ABNORMAL HIGH (ref 80.0–100.0)
Platelets: 168 10*3/uL (ref 150–400)
RBC: 4.3 MIL/uL (ref 4.22–5.81)
RDW: 14.3 % (ref 11.5–15.5)
WBC: 5.5 10*3/uL (ref 4.0–10.5)
nRBC: 0 % (ref 0.0–0.2)

## 2021-06-22 LAB — BASIC METABOLIC PANEL
Anion gap: 6 (ref 5–15)
BUN: 15 mg/dL (ref 6–20)
CO2: 29 mmol/L (ref 22–32)
Calcium: 8.9 mg/dL (ref 8.9–10.3)
Chloride: 100 mmol/L (ref 98–111)
Creatinine, Ser: 0.69 mg/dL (ref 0.61–1.24)
GFR, Estimated: >60 mL/min (ref >60)
Glucose, Bld: 115 mg/dL — ABNORMAL HIGH (ref 70–99)
Potassium: 3.5 mmol/L (ref 3.5–5.1)
Sodium: 135 mmol/L (ref 135–145)

## 2021-06-22 LAB — GLUCOSE, CAPILLARY
Glucose-Capillary: 101 mg/dL — ABNORMAL HIGH (ref 70–99)
Glucose-Capillary: 96 mg/dL (ref 70–99)
Glucose-Capillary: 111 mg/dL — ABNORMAL HIGH (ref 70–99)
Glucose-Capillary: 101 mg/dL — ABNORMAL HIGH (ref 70–99)

## 2021-06-22 SURGERY — IRRIGATION AND DEBRIDEMENT WOUND
Anesthesia: Monitor Anesthesia Care | Laterality: Left

## 2021-06-22 MED ORDER — PROPOFOL 10 MG/ML IV BOLUS
INTRAVENOUS | Status: DC | PRN
  Administered 2021-06-22: 20 mg via INTRAVENOUS

## 2021-06-22 MED ORDER — BUPIVACAINE-EPINEPHRINE (PF) 0.5% -1:200000 IJ SOLN
INTRAMUSCULAR | Status: DC | PRN
  Administered 2021-06-22: 30 mL via PERINEURAL

## 2021-06-22 MED ORDER — CEFAZOLIN SODIUM-DEXTROSE 2-4 GM/100ML-% IV SOLN: 2.0000 g | INTRAVENOUS | Status: DC

## 2021-06-22 MED ORDER — CHLORHEXIDINE GLUCONATE 4 % EX LIQD: 60.0000 mL | Freq: Once | CUTANEOUS | Status: DC

## 2021-06-22 MED ORDER — ACETAMINOPHEN 500 MG PO TABS: 1000.0000 mg | ORAL_TABLET | Freq: Once | ORAL | Status: DC

## 2021-06-22 MED ORDER — POVIDONE-IODINE 10 % EX SWAB: 2.0000 | Freq: Once | CUTANEOUS | Status: DC

## 2021-06-22 MED ORDER — OXYCODONE HCL 5 MG PO TABS
5.0000 mg | ORAL_TABLET | Freq: Four times a day (QID) | ORAL | Status: DC | PRN
  Administered 2021-06-23 – 2021-06-26 (×10): 5 mg via ORAL
  Filled 2021-06-22 (×10): qty 1

## 2021-06-22 MED ORDER — FENTANYL CITRATE (PF) 100 MCG/2ML IJ SOLN
INTRAMUSCULAR | Status: DC | PRN
  Administered 2021-06-22 (×2): 50 ug via INTRAVENOUS

## 2021-06-22 MED ORDER — PROPOFOL 500 MG/50ML IV EMUL
INTRAVENOUS | Status: DC | PRN
  Administered 2021-06-22: 100 ug/kg/min via INTRAVENOUS

## 2021-06-22 MED ORDER — MORPHINE SULFATE (PF) 2 MG/ML IV SOLN
2.0000 mg | INTRAVENOUS | Status: AC | PRN
  Administered 2021-06-22 – 2021-06-23 (×2): 2 mg via INTRAVENOUS
  Filled 2021-06-22 (×2): qty 1

## 2021-06-22 MED ORDER — OXYCODONE HCL 5 MG PO TABS: 5.0000 mg | ORAL_TABLET | Freq: Once | ORAL | Status: DC | PRN

## 2021-06-22 MED ORDER — FENTANYL CITRATE (PF) 100 MCG/2ML IJ SOLN
INTRAMUSCULAR | Status: AC
  Filled 2021-06-22: qty 2

## 2021-06-22 MED ORDER — FENTANYL CITRATE PF 50 MCG/ML IJ SOSY: 25.0000 ug | PREFILLED_SYRINGE | INTRAMUSCULAR | Status: DC | PRN

## 2021-06-22 MED ORDER — CLONIDINE HCL (ANALGESIA) 100 MCG/ML EP SOLN
EPIDURAL | Status: DC | PRN
  Administered 2021-06-22: 100 ug

## 2021-06-22 MED ORDER — MIDAZOLAM HCL 5 MG/5ML IJ SOLN
INTRAMUSCULAR | Status: DC | PRN
  Administered 2021-06-22: 2 mg via INTRAVENOUS

## 2021-06-22 MED ORDER — LACTATED RINGERS IV SOLN: INTRAVENOUS | Status: DC | PRN

## 2021-06-22 MED ORDER — OXYCODONE HCL 5 MG/5ML PO SOLN: 5.0000 mg | Freq: Once | ORAL | Status: DC | PRN

## 2021-06-22 MED ORDER — FOLIC ACID 1 MG PO TABS
1.0000 mg | ORAL_TABLET | Freq: Every day | ORAL | Status: DC
  Administered 2021-06-22 – 2021-06-26 (×5): 1 mg via ORAL
  Filled 2021-06-22 (×5): qty 1

## 2021-06-22 MED ORDER — THIAMINE HCL 100 MG PO TABS
100.0000 mg | ORAL_TABLET | Freq: Every day | ORAL | Status: DC
  Administered 2021-06-22 – 2021-06-26 (×5): 100 mg via ORAL
  Filled 2021-06-22 (×5): qty 1

## 2021-06-22 MED ORDER — MIDAZOLAM HCL 2 MG/2ML IJ SOLN
INTRAMUSCULAR | Status: AC
  Filled 2021-06-22: qty 2

## 2021-06-22 MED ORDER — BUPIVACAINE HCL (PF) 0.5 % IJ SOLN
INTRAMUSCULAR | Status: AC
  Filled 2021-06-22: qty 30

## 2021-06-22 MED ORDER — PROMETHAZINE HCL 25 MG/ML IJ SOLN: 6.2500 mg | INTRAMUSCULAR | Status: DC | PRN

## 2021-06-22 MED ORDER — NICOTINE 14 MG/24HR TD PT24
14.0000 mg | MEDICATED_PATCH | Freq: Every day | TRANSDERMAL | Status: DC
  Administered 2021-06-24: 14 mg via TRANSDERMAL
  Filled 2021-06-22 (×5): qty 1

## 2021-06-22 MED ORDER — AMLODIPINE BESYLATE 5 MG PO TABS
5.0000 mg | ORAL_TABLET | Freq: Every day | ORAL | Status: DC
  Administered 2021-06-22 – 2021-06-23 (×2): 5 mg via ORAL
  Filled 2021-06-22 (×2): qty 1

## 2021-06-22 MED ORDER — ONDANSETRON HCL 4 MG/2ML IJ SOLN
INTRAMUSCULAR | Status: DC | PRN
  Administered 2021-06-22: 4 mg via INTRAVENOUS

## 2021-06-22 MED ORDER — PROPOFOL 1000 MG/100ML IV EMUL
INTRAVENOUS | Status: AC
  Filled 2021-06-22: qty 200

## 2021-06-22 MED ORDER — LABETALOL HCL 5 MG/ML IV SOLN
10.0000 mg | INTRAVENOUS | Status: DC | PRN
  Administered 2021-06-23 (×2): 10 mg via INTRAVENOUS
  Filled 2021-06-22 (×2): qty 4

## 2021-06-22 SURGICAL SUPPLY — 34 items
BAG COUNTER SPONGE SURGICOUNT (BAG) IMPLANT
BAG SPNG CNTER NS LX DISP (BAG)
BLADE HEX COATED 2.75 (ELECTRODE) ×3 IMPLANT
BNDG GAUZE ELAST 4 BULKY (GAUZE/BANDAGES/DRESSINGS) IMPLANT
CORD BIPOLAR FORCEPS 12FT (ELECTRODE) IMPLANT
COVER SURGICAL LIGHT HANDLE (MISCELLANEOUS) ×3 IMPLANT
DRAIN PENROSE 0.25X18 (DRAIN) IMPLANT
DRAPE ORTHO SPLIT 77X108 STRL (DRAPES)
DRAPE SHEET LG 3/4 BI-LAMINATE (DRAPES) IMPLANT
DRAPE STERI IOBAN 125X83 (DRAPES) IMPLANT
DRAPE SURG ORHT 6 SPLT 77X108 (DRAPES) IMPLANT
DRSG EMULSION OIL 3X16 NADH (GAUZE/BANDAGES/DRESSINGS) IMPLANT
DRSG PAD ABDOMINAL 8X10 ST (GAUZE/BANDAGES/DRESSINGS) IMPLANT
DURAPREP 26ML APPLICATOR (WOUND CARE) IMPLANT
ELECT REM PT RETURN 15FT ADLT (MISCELLANEOUS) IMPLANT
GAUZE SPONGE 4X4 12PLY STRL (GAUZE/BANDAGES/DRESSINGS) IMPLANT
GLOVE SURG ENC MOIS LTX SZ6.5 (GLOVE) ×3 IMPLANT
GOWN STRL REUS W/TWL LRG LVL3 (GOWN DISPOSABLE) ×3 IMPLANT
HANDPIECE INTERPULSE COAX TIP (DISPOSABLE)
KIT BASIN OR (CUSTOM PROCEDURE TRAY) ×3 IMPLANT
KIT TURNOVER KIT A (KITS) IMPLANT
MANIFOLD NEPTUNE II (INSTRUMENTS) IMPLANT
PACK ORTHO EXTREMITY (CUSTOM PROCEDURE TRAY) ×3 IMPLANT
PENCIL SMOKE EVACUATOR (MISCELLANEOUS) IMPLANT
SET HNDPC FAN SPRY TIP SCT (DISPOSABLE) IMPLANT
STAPLER VISISTAT 35W (STAPLE) ×3 IMPLANT
STOCKINETTE 8 INCH (MISCELLANEOUS) IMPLANT
SUT ETHILON 2 0 PS N (SUTURE) IMPLANT
SUT ETHILON 3 0 PS 1 (SUTURE) ×1 IMPLANT
SUT VIC AB 5-0 PS2 18 (SUTURE) IMPLANT
SWAB COLLECTION DEVICE MRSA (MISCELLANEOUS) IMPLANT
SWAB CULTURE ESWAB REG 1ML (MISCELLANEOUS) IMPLANT
TOWEL OR 17X26 10 PK STRL BLUE (TOWEL DISPOSABLE) ×6 IMPLANT
TOWEL OR NON WOVEN STRL DISP B (DISPOSABLE) ×3 IMPLANT

## 2021-06-22 NOTE — Anesthesia Procedure Notes (Signed)
°  Anesthesia Regional Block: Supraclavicular block   Pre-Anesthetic Checklist: , timeout performed,  Correct Patient, Correct Site, Correct Laterality,  Correct Procedure, Correct Position, site marked,  Risks and benefits discussed,  Pre-op evaluation,  At surgeon's request and post-op pain management  Laterality: Left  Prep: Maximum Sterile Barrier Precautions used, chloraprep       Needles:  Injection technique: Single-shot  Needle Type: Echogenic Stimulator Needle     Needle Length: 9cm  Needle Gauge: 22     Additional Needles:   Procedures:,,,, ultrasound used (permanent image in chart),,    Narrative:  Start time: 06/22/2021 8:56 PM End time: 06/22/2021 8:59 PM Injection made incrementally with aspirations every 5 mL.  Performed by: Personally  Anesthesiologist: Brennan Bailey, MD  Additional Notes: Risks, benefits, and alternative discussed. Patient gave consent for procedure. Patient prepped and draped in sterile fashion. Sedation administered, patient remains easily responsive to voice. Relevant anatomy identified with ultrasound guidance. Local anesthetic given in 5cc increments with no signs or symptoms of intravascular injection. No pain or paraesthesias with injection. Patient monitored throughout procedure with signs of LAST or immediate complications. Tolerated well. Ultrasound image placed in chart.  Tawny Asal, MD
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
## 2021-06-22 NOTE — Plan of Care (Signed)
Problem: Clinical Measurements: Goal: Ability to avoid or minimize complications of infection will improve 06/22/2021 0955 by Ivan Anchors, RN Outcome: Progressing   Problem: Skin Integrity: Goal: Skin integrity will improve 06/22/2021 0955 by Ivan Anchors, RN Outcome: Progressing  Problem: Education: Goal: Knowledge of General Education information will improve Description: Including pain rating scale, medication(s)/side effects and non-pharmacologic comfort measures 06/22/2021 0955 by Ivan Anchors, RN Outcome: Progressing   Ivan Anchors, RN 06/22/21 9:56 AM

## 2021-06-22 NOTE — Progress Notes (Addendum)
PROGRESS NOTE    [name] [name].  MBW:[id number] DOB: November 26, 1965 DOA: 06/21/2021 PCP: [name], NP   Chief Complain: Left hand swelling/pain  Brief Narrative: Patient is a 56 year old male with history of COPD, paroxysmal A-fib not on anticoagulation, chronic diastolic congestive heart failure, homelessness, polysubstance abuse, IV drug abuse, smoker, occasional drinker who presents with complaint of left hand swelling, pain.  He gives report of injecting cocaine on his left wrist and left elbow few days ago.  No report of fever or chills.  On presentation, he is hypertensive.  Patient was admitted for the management of left hand cellulitis.  Hand surgery consulted today.  Assessment & Plan:   Principal Problem:   Cellulitis of multiple sites of left hand and fingers Active Problems:   COPD (chronic obstructive pulmonary disease) (HCC)   Cocaine use   Chronic diastolic CHF (congestive heart failure) (HCC)   Type 2 diabetes mellitus (HCC)   Paroxysmal atrial fibrillation (HCC)   Left hand cellulitis: In the setting of IV drug use.  X-ray with did not show any evidence of fracture.  Hand surgery consulted today.  Start home dose of antibiotics with vancomycin and ceftriaxone.  Follow-up blood cultures.  Currently not septic, hemodynamically stable.  History of paroxysmal A-fib: Currently normal sinus rhythm.  Not on anticoagulant due to history of noncompliance.  Not on any rate control medication.  Hypertension: Remains hypertensive.  History of hypertension but does not take any medication.  Started on amlodipine 5 mg daily, can titrate as needed.    History of drug abuse: Injects cocaine.  Counseled cessation.  Tobacco use: Smokes a pack a day.  Counseled cessation.  Ordered nicotine patch  History of COPD: Currently not in exacerbation.  Continue bronchodilators as needed.  Macrocytosis: This is most likely secondary to chronic alcohol use.  Hemoglobin  stable  Alcohol abuse : Denies every day alcohol intake.  Currently not in withdrawal.  Started on thiamine folate  History of diabetes type 2: On sliding scale insulin.  Recent hemoglobin A1c of 6.1.  Obesity: BMI of 33.4  Patient is homeless.  We will request to [place] consultation         DVT prophylaxis: Heparin subcu Code Status: Full code Family Communication: None at the bedside Patient status: Inpatient  Dispo: The patient is from: Home              Anticipated d/c is to: Home              Anticipated d/c date is: Not sure, needs hand surgery evaluation  Consultants: Hand surgery  Procedures: None  Antimicrobials:  Anti-infectives (From admission, onward)    Start     Dose/Rate Route Frequency Ordered Stop   06/22/21 1800  cefTRIAXone (ROCEPHIN) 1 g in sodium chloride 0.9 % 100 mL IVPB        1 g 200 mL/hr over 30 Minutes Intravenous Every 24 hours 06/21/21 2116     06/22/21 0800  vancomycin (VANCOCIN) IVPB 1000 mg/200 mL premix  Status:  Discontinued        1,000 mg 200 mL/hr over 60 Minutes Intravenous Every 12 hours 06/21/21 2119 06/21/21 2125   06/22/21 0600  vancomycin (VANCOREADY) IVPB 1250 mg/250 mL        1,250 mg 166.7 mL/hr over 90 Minutes Intravenous Every 12 hours 06/21/21 2125     06/21/21 1915  cefTRIAXone (ROCEPHIN) 1 g in sodium chloride 0.9 % 100 mL IVPB  1 g 200 mL/hr over 30 Minutes Intravenous  Once 06/21/21 1913 06/21/21 2142   06/21/21 1915  vancomycin (VANCOCIN) IVPB 1000 mg/200 mL premix        1,000 mg 200 mL/hr over 60 Minutes Intravenous  Once 06/21/21 1913 06/21/21 2143       Subjective: Patient seen and examined at bedside this morning.  Hemodynamically stable.  Hypertensive.  Denies any complaints besides pain/swelling on the left hand  Objective: Vitals:   06/21/21 2128 06/21/21 2244 06/22/21 0214 06/22/21 0653  BP:  (!) 186/98 (!) 153/79 (!) 167/107  Pulse:  65 (!) 58 62  Resp:  18 18 18   Temp: 98.1 F (36.7 C)  98.7 F (37.1 C) 98.9 F (37.2 C) 98.6 F (37 C)  TempSrc: Oral Oral Oral Oral  SpO2:  100% 100% 98%  Weight:      Height:        Intake/Output Summary (Last 24 hours) at 06/22/2021 4097 Last data filed at 06/22/2021 0600 Gross per 24 hour  Intake 540 ml  Output --  Net 540 ml   Filed Weights   06/21/21 1337  Weight: 99.8 kg    Examination:  General exam: Overall comfortable, not in distress,obese HEENT: PERRL Respiratory system:  no wheezes or crackles  Cardiovascular system: S1 & S2 heard, RRR.  Gastrointestinal system: Abdomen is nondistended, soft and nontender. Central nervous system: Alert and oriented Extremities: Edema, tenderness of the left hand  skin: Multiple scattered rashes, mild skin breakdowns    Data Reviewed: I have personally reviewed following labs and imaging studies  CBC: Recent Labs  Lab 06/21/21 1412 06/22/21 0341  WBC 7.0 5.5  NEUTROABS 5.4  --   HGB 16.1 15.0  HCT 45.9 44.1  MCV 101.1* 102.6*  PLT 168 353   Basic Metabolic Panel: Recent Labs  Lab 06/21/21 1412 06/22/21 0341  NA 133* 135  K 3.5 3.5  CL 99 100  CO2 26 29  GLUCOSE 157* 115*  BUN 11 15  CREATININE 0.65 0.69  CALCIUM 9.0 8.9   GFR: Estimated Creatinine Clearance: 118.1 mL/min (by C-G formula based on SCr of 0.69 mg/dL). Liver Function Tests: Recent Labs  Lab 06/21/21 1412  AST 25  ALT 27  ALKPHOS 66  BILITOT 1.6*  PROT 7.5  ALBUMIN 3.7   No results for input(s): LIPASE, AMYLASE in the last 168 hours. No results for input(s): AMMONIA in the last 168 hours. Coagulation Profile: No results for input(s): INR, PROTIME in the last 168 hours. Cardiac Enzymes: No results for input(s): CKTOTAL, CKMB, CKMBINDEX, TROPONINI in the last 168 hours. BNP (last 3 results) No results for input(s): PROBNP in the last 8760 hours. HbA1C: No results for input(s): HGBA1C in the last 72 hours. CBG: Recent Labs  Lab 06/21/21 2246 06/22/21 0750  GLUCAP 111* 101*    Lipid Profile: No results for input(s): CHOL, HDL, LDLCALC, TRIG, CHOLHDL, LDLDIRECT in the last 72 hours. Thyroid Function Tests: No results for input(s): TSH, T4TOTAL, FREET4, T3FREE, THYROIDAB in the last 72 hours. Anemia Panel: No results for input(s): VITAMINB12, FOLATE, FERRITIN, TIBC, IRON, RETICCTPCT in the last 72 hours. Sepsis Labs: Recent Labs  Lab 06/21/21 2032 06/21/21 2257  LATICACIDVEN 0.9 1.7    Recent Results (from the past 240 hour(s))  Resp Panel by RT-PCR (Flu A&B, Covid) Nasopharyngeal Swab     Status: None   Collection Time: 06/21/21  7:11 PM   Specimen: Nasopharyngeal Swab; Nasopharyngeal(NP) swabs in vial transport medium  Result Value Ref Range Status   SARS Coronavirus 2 by RT PCR NEGATIVE NEGATIVE Final    Comment: (NOTE) SARS-CoV-2 target nucleic acids are NOT DETECTED.  The SARS-CoV-2 RNA is generally detectable in upper respiratory specimens during the acute phase of infection. The lowest concentration of SARS-CoV-2 viral copies this assay can detect is 138 copies/mL. A negative result does not preclude SARS-Cov-2 infection and should not be used as the sole basis for treatment or other patient management decisions. A negative result may occur with  improper specimen collection/handling, submission of specimen other than nasopharyngeal swab, presence of viral mutation(s) within the areas targeted by this assay, and inadequate number of viral copies(<138 copies/mL). A negative result must be combined with clinical observations, patient history, and epidemiological information. The expected result is Negative.  Fact Sheet for Patients:  EntrepreneurPulse.com.au  Fact Sheet for Healthcare Providers:  IncredibleEmployment.be  This test is no t yet approved or cleared by the [place] FDA and  has been authorized for detection and/or diagnosis of SARS-CoV-2 by FDA under an Emergency Use Authorization  (EUA). This EUA will remain  in effect (meaning this test can be used) for the duration of the COVID-19 declaration under Section 564(b)(1) of the Act, 21 U.S.C.section 360bbb-3(b)(1), unless the authorization is terminated  or revoked sooner.       Influenza A by PCR NEGATIVE NEGATIVE Final   Influenza B by PCR NEGATIVE NEGATIVE Final    Comment: (NOTE) The Xpert Xpress SARS-CoV-2/FLU/RSV plus assay is intended as an aid in the diagnosis of influenza from Nasopharyngeal swab specimens and should not be used as a sole basis for treatment. Nasal washings and aspirates are unacceptable for Xpert Xpress SARS-CoV-2/FLU/RSV testing.  Fact Sheet for Patients: EntrepreneurPulse.com.au  Fact Sheet for Healthcare Providers: IncredibleEmployment.be  This test is not yet approved or cleared by the [place] FDA and has been authorized for detection and/or diagnosis of SARS-CoV-2 by FDA under an Emergency Use Authorization (EUA). This EUA will remain in effect (meaning this test can be used) for the duration of the COVID-19 declaration under Section 564(b)(1) of the Act, 21 U.S.C. section 360bbb-3(b)(1), unless the authorization is terminated or revoked.  Performed at [facility], [place] [address], [place], [place] [address]          Radiology Studies: DG Hand Complete Left  Result Date: 06/21/2021 CLINICAL DATA:  Fall pain  and swelling hand EXAM: LEFT HAND - COMPLETE 3+ VIEW COMPARISON:  10/13/2015 FINDINGS: Negative for acute fracture. Two screws in the base of the first metacarpal unchanged from the prior study. Mild arthropathy the base of the thumb. No other significant arthropathy. No erosion. IMPRESSION: Negative for fracture.  Surgical changes base of thumb. Electronically Signed   By: Franchot Gallo M.D.   On: 06/21/2021 13:55        Scheduled Meds:  amLODipine  5 mg Oral Daily   heparin  5,000 Units Subcutaneous  Q8H   insulin aspart  0-9 Units Subcutaneous TID WC   Continuous Infusions:  cefTRIAXone (ROCEPHIN)  IV     vancomycin 1,250 mg (06/22/21 0552)     LOS: 0 days       Shelly Coss, MD [facility] P2/06/2021, 8:33 AM

## 2021-06-22 NOTE — Anesthesia Preprocedure Evaluation (Addendum)
Anesthesia Evaluation  Patient identified by MRN, date of birth, ID band Patient awake    Reviewed: Allergy & Precautions, NPO status , Patient's Chart, lab work & pertinent test results  History of Anesthesia Complications Negative for: history of anesthetic complications  Airway Mallampati: II  TM Distance: >3 FB Neck ROM: Full    Dental  (+) Edentulous Lower, Edentulous Upper   Pulmonary sleep apnea , COPD, Current Smoker,    Pulmonary exam normal        Cardiovascular hypertension, +CHF  Normal cardiovascular exam+ dysrhythmias Atrial Fibrillation      Neuro/Psych Anxiety negative neurological ROS     GI/Hepatic negative GI ROS, (+)     substance abuse  cocaine use and IV drug use,   Endo/Other  diabetes, Poorly Controlled, Type 2  Renal/GU negative Renal ROS     Musculoskeletal   Abdominal   Peds  Hematology negative hematology ROS (+)   Anesthesia Other Findings Day of surgery medications reviewed with patient.  Reproductive/Obstetrics                            Anesthesia Physical Anesthesia Plan  ASA: 3  Anesthesia Plan: Regional and MAC   Post-op Pain Management:    Induction:   PONV Risk Score and Plan: 0 and Treatment may vary due to age or medical condition, Midazolam and Propofol infusion  Airway Management Planned: Natural Airway and Simple Face Mask  Additional Equipment: None  Intra-op Plan:   Post-operative Plan:   Informed Consent: I have reviewed the patients History and Physical, chart, labs and discussed the procedure including the risks, benefits and alternatives for the proposed anesthesia with the patient or authorized representative who has indicated his/her understanding and acceptance.       Plan Discussed with: CRNA  Anesthesia Plan Comments:        Anesthesia Quick Evaluation

## 2021-06-23 ENCOUNTER — Encounter (HOSPITAL_COMMUNITY): Payer: Self-pay | Admitting: General Surgery

## 2021-06-23 LAB — CREATININE, SERUM
Creatinine, Ser: 0.81 mg/dL (ref 0.61–1.24)
GFR, Estimated: >60 mL/min (ref >60)

## 2021-06-23 LAB — GLUCOSE, CAPILLARY
Glucose-Capillary: 108 mg/dL — ABNORMAL HIGH (ref 70–99)
Glucose-Capillary: 200 mg/dL — ABNORMAL HIGH (ref 70–99)
Glucose-Capillary: 260 mg/dL — ABNORMAL HIGH (ref 70–99)
Glucose-Capillary: 100 mg/dL — ABNORMAL HIGH (ref 70–99)

## 2021-06-23 MED ORDER — AMLODIPINE BESYLATE 5 MG PO TABS
5.0000 mg | ORAL_TABLET | Freq: Once | ORAL | Status: AC
  Administered 2021-06-23: 5 mg via ORAL
  Filled 2021-06-23: qty 1

## 2021-06-23 MED ORDER — AMLODIPINE BESYLATE 10 MG PO TABS
10.0000 mg | ORAL_TABLET | Freq: Every day | ORAL | Status: DC
  Administered 2021-06-24 – 2021-06-26 (×3): 10 mg via ORAL
  Filled 2021-06-23 (×3): qty 1

## 2021-06-23 NOTE — Discharge Instructions (Signed)
Outpatient Substance Use Treatment Services   [facility] Intensive Outpatient Program [address] [place], [place] [address]  [phone] Private insurance, Medicare A&B, and [facility]   ADS (Alcohol and Drug Services)  [address],  [place], [place] [address] [phone] Medicaid, [place] [address] # [address]into [place]  [place], [place] Medicaid and [facility], Self Pay   [facility] [address]  [place], [place] [facility], and Self Pay  [facility] [place]    [place], [place] [address]  [phone] or [phone] Private Insurance Only   [facility] [place] [facility] [address]  [place], [place] [phone] Medicaid, Medicare, [place] at the [facility] [address] [place], [place] [address] [phone] Services are free or reduced  [facility]  [address] [phone]  Self Pay only, sliding scale  [facility]  [address]  [place], [place] [address] [phone] ([facility]) Self Pay, Medicaid Only   [facility] [place], [place] [address] [phone] Medicaid, Medicare, [place]   Residential Substance Use Treatment Services   [facility] ([place]  [address]  [place], [place] [address]  [phone] or [phone] Detox (Medicare, Medicaid, private insurance, and self pay)  Residential Rehab 14 days (Medicare, [place], private insurance, and self pay)   RTS (Residential Treatment Services)  [place], [place]  Male and Male Detox (Self Pay and Medicaid limited availability)  Rehab only Male ([place] and self pay only)   [facility] [address]      [address]   [place], [place] [address]  [phone] or [phone] Detox and [place]  [place].  [place], [place] [address]  [phone]  Treatment Only, must make assessment appointment, and must be sober for assessment appointment.  Self Pay Only, Medicare A&B, [facility], [place] ID only! *Transportation assistance offered from [place] on [address] [place], [place] [address] Walk in interviews M-Sat 8-4p No pending legal charges [phone]  ADATC:  [facility] Referral  [address] [place], [place] (Self Pay, [facility])  [facility] [address] [place], [place] [address] [phone] Detox and Residential Treatment Medicare and [place].  [place], [place] [address] [place]: [place]: [phone] Long-term Residential Program:  [phone] Males 25 and Over (No Insurance, upfront fee)  [place], [place] [address] [phone] Private Insurance with [place], [place], [place] [address] Local ([place].  [place],  [address]  [phone] (Males, upfront fee)  [place] [address]  [place], [place] Private Insurance

## 2021-06-23 NOTE — Progress Notes (Signed)
PROGRESS NOTE    [name] [name].  FAO:[id number] DOB: 08/03/1965 DOA: 06/21/2021 PCP: [name], NP   Chief Complain: Left hand swelling/pain  Brief Narrative: Patient is a 56 year old male with history of COPD, paroxysmal A-fib not on anticoagulation, chronic diastolic congestive heart failure, homelessness, polysubstance abuse, IV drug abuse, smoker, occasional drinker who presents with complaint of left hand swelling, pain.  He gives report of injecting cocaine on his left wrist and left elbow few days ago.  No report of fever or chills.  On presentation, he is hypertensive.  Patient was admitted for the management of left hand cellulitis.  Hand surgery consulted and he underwent incision and drainage in the evening of 2/2.  Assessment & Plan:   Principal Problem:   Cellulitis of multiple sites of left hand and fingers Active Problems:   COPD (chronic obstructive pulmonary disease) (HCC)   Cocaine use   Chronic diastolic CHF (congestive heart failure) (HCC)   Type 2 diabetes mellitus (HCC)   Paroxysmal atrial fibrillation (HCC)   Left hand cellulitis: In the setting of IV drug use.  X-ray with did not show any evidence of fracture.  Hand surgery consulted, started  antibiotics with vancomycin and ceftriaxone.  Follow-up blood cultures.  Currently not septic, hemodynamically stable.  Looks like wound cultures have not been sent.  We will follow-up with hand surgery for further recommendation.  Currently pain is well controlled  History of paroxysmal A-fib: Currently normal sinus rhythm.  Not on anticoagulant due to history of noncompliance.  Not on any rate control medication.  Hypertension: Remains hypertensive.  History of hypertension but does not take any medication.  Started on amlodipine 10 mg daily  History of drug abuse: Injects cocaine.  Counseled cessation.  Tobacco use: Smokes a pack a day.  Counseled cessation.  Ordered nicotine patch  History of  COPD: Currently not in exacerbation.  Continue bronchodilators as needed.  Macrocytosis: This is most likely secondary to chronic alcohol use.  Hemoglobin stable  Alcohol abuse : Denies every day alcohol intake.  Currently not in withdrawal.  Started on thiamine ,folate  History of diabetes type 2: On sliding scale insulin.  Recent hemoglobin A1c of 6.1.  Does not take any medication at baseline  Obesity: BMI of 33.4  Patient is homeless.  We have requested TOC consultation.  Patient says he has a place to stay when he is released         DVT prophylaxis: Heparin subcu Code Status: Full code Family Communication: None at the bedside Patient status: Inpatient  Dispo: The patient is from: Home              Anticipated d/c is to: Home              Anticipated d/c date is: Awaiting hand surgery clearance for discharge.  Patient does not feel that he is ready for discharge today.  Complaining of pain on the operated  hand  Consultants: Hand surgery  Procedures: None  Antimicrobials:  Anti-infectives (From admission, onward)    Start     Dose/Rate Route Frequency Ordered Stop   06/22/21 2300  ceFAZolin (ANCEF) IVPB 2g/100 mL premix  Status:  Discontinued        2 g 200 mL/hr over 30 Minutes Intravenous On call to O.R. 06/22/21 2247 06/22/21 2307   06/22/21 1800  cefTRIAXone (ROCEPHIN) 1 g in sodium chloride 0.9 % 100 mL IVPB        1  g 200 mL/hr over 30 Minutes Intravenous Every 24 hours 06/21/21 2116     06/22/21 0800  vancomycin (VANCOCIN) IVPB 1000 mg/200 mL premix  Status:  Discontinued        1,000 mg 200 mL/hr over 60 Minutes Intravenous Every 12 hours 06/21/21 2119 06/21/21 2125   06/22/21 0600  vancomycin (VANCOREADY) IVPB 1250 mg/250 mL        1,250 mg 166.7 mL/hr over 90 Minutes Intravenous Every 12 hours 06/21/21 2125     06/21/21 1915  cefTRIAXone (ROCEPHIN) 1 g in sodium chloride 0.9 % 100 mL IVPB        1 g 200 mL/hr over 30 Minutes Intravenous  Once 06/21/21  1913 06/21/21 2142   06/21/21 1915  vancomycin (VANCOCIN) IVPB 1000 mg/200 mL premix        1,000 mg 200 mL/hr over 60 Minutes Intravenous  Once 06/21/21 1913 06/21/21 2143       Subjective: Patient seen and examined at the bedside this morning.  Hypertensive this morning.  Complains some pain on the operated area. denies any new complaints  Objective: Vitals:   06/22/21 2239 06/23/21 0156 06/23/21 0554 06/23/21 1014  BP: (!) 144/92 (!) 168/90 (!) 152/88 (!) 173/103  Pulse: 63 66 61 93  Resp: 16 20 (!) 21 20  Temp: (!) 97.5 F (36.4 C) 98.6 F (37 C) 98 F (36.7 C) 97.9 F (36.6 C)  TempSrc: Oral Oral Oral Oral  SpO2: 93% 95% 95% 94%  Weight:      Height:        Intake/Output Summary (Last 24 hours) at 06/23/2021 1152 Last data filed at 06/23/2021 1000 Gross per 24 hour  Intake 2408.39 ml  Output 200 ml  Net 2208.39 ml   Filed Weights   06/21/21 1337  Weight: 99.8 kg    Examination:   General exam: Overall comfortable, not in distress,obese HEENT: PERRL Respiratory system:  no wheezes or crackles  Cardiovascular system: S1 & S2 heard, RRR.  Gastrointestinal system: Abdomen is nondistended, soft and nontender. Central nervous system: Alert and oriented Extremities: No edema, no clubbing ,no cyanosis, dressing on the left hand Skin: Multiple scattered rashes, mild swelling breakdowns  Data Reviewed: I have personally reviewed following labs and imaging studies  CBC: Recent Labs  Lab 06/21/21 1412 06/22/21 0341  WBC 7.0 5.5  NEUTROABS 5.4  --   HGB 16.1 15.0  HCT 45.9 44.1  MCV 101.1* 102.6*  PLT 168 073   Basic Metabolic Panel: Recent Labs  Lab 06/21/21 1412 06/22/21 0341 06/23/21 0317  NA 133* 135  --   K 3.5 3.5  --   CL 99 100  --   CO2 26 29  --   GLUCOSE 157* 115*  --   BUN 11 15  --   CREATININE 0.65 0.69 0.81  CALCIUM 9.0 8.9  --    GFR: Estimated Creatinine Clearance: 116.7 mL/min (by C-G formula based on SCr of 0.81 mg/dL). Liver  Function Tests: Recent Labs  Lab 06/21/21 1412  AST 25  ALT 27  ALKPHOS 66  BILITOT 1.6*  PROT 7.5  ALBUMIN 3.7   No results for input(s): LIPASE, AMYLASE in the last 168 hours. No results for input(s): AMMONIA in the last 168 hours. Coagulation Profile: No results for input(s): INR, PROTIME in the last 168 hours. Cardiac Enzymes: No results for input(s): CKTOTAL, CKMB, CKMBINDEX, TROPONINI in the last 168 hours. BNP (last 3 results) No results for input(s): PROBNP in  the last 8760 hours. HbA1C: No results for input(s): HGBA1C in the last 72 hours. CBG: Recent Labs  Lab 06/22/21 0750 06/22/21 1139 06/22/21 1731 06/22/21 2248 06/23/21 0729  GLUCAP 101* 96 111* 101* 108*   Lipid Profile: No results for input(s): CHOL, HDL, LDLCALC, TRIG, CHOLHDL, LDLDIRECT in the last 72 hours. Thyroid Function Tests: No results for input(s): TSH, T4TOTAL, FREET4, T3FREE, THYROIDAB in the last 72 hours. Anemia Panel: No results for input(s): VITAMINB12, FOLATE, FERRITIN, TIBC, IRON, RETICCTPCT in the last 72 hours. Sepsis Labs: Recent Labs  Lab 06/21/21 2032 06/21/21 2257  LATICACIDVEN 0.9 1.7    Recent Results (from the past 240 hour(s))  Resp Panel by RT-PCR (Flu A&B, Covid) Nasopharyngeal Swab     Status: None   Collection Time: 06/21/21  7:11 PM   Specimen: Nasopharyngeal Swab; Nasopharyngeal(NP) swabs in vial transport medium  Result Value Ref Range Status   SARS Coronavirus 2 by RT PCR NEGATIVE NEGATIVE Final    Comment: (NOTE) SARS-CoV-2 target nucleic acids are NOT DETECTED.  The SARS-CoV-2 RNA is generally detectable in upper respiratory specimens during the acute phase of infection. The lowest concentration of SARS-CoV-2 viral copies this assay can detect is 138 copies/mL. A negative result does not preclude SARS-Cov-2 infection and should not be used as the sole basis for treatment or other patient management decisions. A negative result may occur with  improper  specimen collection/handling, submission of specimen other than nasopharyngeal swab, presence of viral mutation(s) within the areas targeted by this assay, and inadequate number of viral copies(<138 copies/mL). A negative result must be combined with clinical observations, patient history, and epidemiological information. The expected result is Negative.  Fact Sheet for Patients:  EntrepreneurPulse.com.au  Fact Sheet for Healthcare Providers:  IncredibleEmployment.be  This test is no t yet approved or cleared by the [place] FDA and  has been authorized for detection and/or diagnosis of SARS-CoV-2 by FDA under an Emergency Use Authorization (EUA). This EUA will remain  in effect (meaning this test can be used) for the duration of the COVID-19 declaration under Section 564(b)(1) of the Act, 21 U.S.C.section 360bbb-3(b)(1), unless the authorization is terminated  or revoked sooner.       Influenza A by PCR NEGATIVE NEGATIVE Final   Influenza B by PCR NEGATIVE NEGATIVE Final    Comment: (NOTE) The Xpert Xpress SARS-CoV-2/FLU/RSV plus assay is intended as an aid in the diagnosis of influenza from Nasopharyngeal swab specimens and should not be used as a sole basis for treatment. Nasal washings and aspirates are unacceptable for Xpert Xpress SARS-CoV-2/FLU/RSV testing.  Fact Sheet for Patients: EntrepreneurPulse.com.au  Fact Sheet for Healthcare Providers: IncredibleEmployment.be  This test is not yet approved or cleared by the [place] FDA and has been authorized for detection and/or diagnosis of SARS-CoV-2 by FDA under an Emergency Use Authorization (EUA). This EUA will remain in effect (meaning this test can be used) for the duration of the COVID-19 declaration under Section 564(b)(1) of the Act, 21 U.S.C. section 360bbb-3(b)(1), unless the authorization is terminated or revoked.  Performed at  [facility], [place] [address] [place], [place] [address]   Blood culture (routine x 2)     Status: None (Preliminary result)   Collection Time: 06/21/21  8:32 PM   Specimen: BLOOD  Result Value Ref Range Status   Specimen Description   Final    BLOOD RIGHT ARM Performed at [place] [address], [place], [place] [address]  Special Requests   Final    BOTTLES DRAWN AEROBIC AND ANAEROBIC Blood Culture adequate volume Performed at [place] [address], [place], [place] [address]    Culture   Final    NO GROWTH 2 DAYS Performed at [place] [address], [place], [place] [address]    Report Status PENDING  Incomplete  Blood culture (routine x 2)     Status: None (Preliminary result)   Collection Time: 06/21/21 10:57 PM   Specimen: BLOOD  Result Value Ref Range Status   Specimen Description   Final    BLOOD BLOOD RIGHT HAND Performed at [place] [address], [place], [place] [address]    Special Requests   Final    BOTTLES DRAWN AEROBIC ONLY Blood Culture adequate volume Performed at [place] [address], [place], [place] [address]    Culture   Final    NO GROWTH 1 DAY Performed at [facility], [place] [address], [place], [place] [address]    Report Status PENDING  Incomplete         Radiology Studies: DG Hand Complete Left  Result Date: 06/21/2021 CLINICAL DATA:  Fall pain  and swelling hand EXAM: LEFT HAND - COMPLETE 3+ VIEW COMPARISON:  10/13/2015 FINDINGS: Negative for acute fracture. Two screws in the base of the first metacarpal unchanged from the prior study. Mild arthropathy the base of the thumb. No other significant arthropathy. No erosion. IMPRESSION: Negative for fracture.  Surgical changes base of thumb. Electronically Signed   By: Franchot Gallo M.D.   On: 06/21/2021 13:55        Scheduled Meds:  amLODipine  5 mg  Oral Daily   folic acid  1 mg Oral Daily   heparin  5,000 Units Subcutaneous Q8H   insulin aspart  0-9 Units Subcutaneous TID WC   nicotine  14 mg Transdermal Daily   thiamine  100 mg Oral Daily   Continuous Infusions:  cefTRIAXone (ROCEPHIN)  IV Stopped (06/22/21 1843)   vancomycin 1,250 mg (06/23/21 0606)     LOS: 1 day       Shelly Coss, MD [facility] P2/07/2021, 11:52 AM

## 2021-06-23 NOTE — Op Note (Signed)
NAME: [name], [name] MEDICAL RECORD NO: [id number] ACCOUNT NO: [id number] DATE OF BIRTH: Dec 16, 1965 FACILITY: [id number] PHYSICIAN: Donivin Wirt C. Lenon Curt, MD  Operative Report   DATE OF PROCEDURE: 06/22/2021  PREOPERATIVE DIAGNOSES:  Cellulitis and abscesses of the left long finger and cellulitis to the dorsal hand.  POSTOPERATIVE DIAGNOSES:  Cellulitis and abscesses of the left long finger and cellulitis to the dorsal hand.  PROCEDURE:  Removal of nail plate left long finger.  Incision and drainage of paronychia and felon of the left long finger. I and D of extensor tendon sheath of the left long finger.  INDICATIONS:  The patient is a 56 year old male who presented to the ED yesterday with signs and symptoms of cellulitis of his left upper extremity.  He was admitted, placed on IV antibiotics and failed to progress and is felt in the need of I and D.   Consent was obtained.  DESCRIPTION OF PROCEDURE:  The patient was taken to the operating room after upper extremity block was performed by Anesthesia.  Anesthesia was administered.  The left upper extremity was prepped and draped in normal sterile fashion.  A timeout was  performed.  No additional antibiotics were given since he was on antibiotics on the floor.  Finger was evaluated.  There was what appeared to be a significant paronychia.  This was incised and moderate amount of pus was evacuated. Superficial skin was  debrided with scissors.  The nail plate was removed.  There was some tracking of the infection along the nail bed more volarly on the ulnar side.  This was I and D'd as well.  The finger was diffusely swollen more on the dorsal side without evidence of  flexor tenosynovitis.  This was most pronounced over the proximal phalanx; therefore, an incision was made in the midline over the proximal phalanx dorsally.  Dissection was carried down to the extensor tendon sheath.  The sheath was opened.  There was  no purulent  material in the extensor tendon sheath. Angiosome edematous type fluid in the subcutaneous tissues.  This wound was irrigated and closed with several interrupted 3-0 nylon sutures.  The dorsal hand was evaluated and there was no clear  loculated abscess, more of just a generalized cellulitis, which very well could have been from the distal fingertip.  Hemostasis was controlled.  Xeroform dressing was placed over the nail bed.  A sterile dressing was placed over the finger.  The patient  tolerated the procedure well and was taken to recovery room and stable.   VAI D: 06/22/2021 10:05:55 pm T: 06/23/2021 12:10:00 am  JOB: [id number]/ [id number]

## 2021-06-23 NOTE — Plan of Care (Signed)
°  Problem: Clinical Measurements: Goal: Ability to avoid or minimize complications of infection will improve Outcome: Progressing   Problem: Education: Goal: Knowledge of General Education information will improve Description: Including pain rating scale, medication(s)/side effects and non-pharmacologic comfort measures Outcome: Progressing   Problem: Activity: Goal: Risk for activity intolerance will decrease Outcome: Progressing   Problem: Nutrition: Goal: Adequate nutrition will be maintained Outcome: Progressing   Problem: Coping: Goal: Level of anxiety will decrease Outcome: Progressing   Problem: Pain Managment: Goal: General experience of comfort will improve Outcome: Progressing

## 2021-06-23 NOTE — TOC Initial Note (Signed)
Transition of Care (TOC) - Initial/Assessment Note   Patient Details  Name: [name]. MRN: [id number] Date of Birth: 06/04/1965  Transition of Care [facility]) CM/SW Contact:    Sherie Don, LCSW Phone Number: 06/23/2021, 1:41 PM  Clinical Narrative: [facility] consulted for substance use and homeless issues. CSW spoke with patient and he is agreeable to substance use resources being added to his AVS. Per chart review, patient has also received these resources several times in the last year from [facility] at [facility]. CSW asked about patient's homelessness. Patient stated, "I got a tent" and reported his tent is "around [address]" by [employer]. Patient reported he will need a ride back to his tent at discharge. CSW added substance use resources to AVS. TOC to follow.  Expected Discharge Plan: Home/Self Care (Patient will discharge to his tent he lives in by [employer] off of [address].) Barriers to Discharge: Homeless with medical needs, Continued Medical Work up  Patient Goals and CMS Choice Patient states their goals for this hospitalization and ongoing recovery are:: Discharge back to his tent with a ride CMS Medicare.gov Compare Post Acute Care list provided to:: Patient Choice offered to / list presented to : NA  Expected Discharge Plan and Services Expected Discharge Plan: Home/Self Care (Patient will discharge to his tent he lives in by [employer] off of [address].) In-house Referral: Clinical Social Work Post Acute Care Choice: NA Living arrangements for the past 2 months: Homeless             DME Arranged: N/A DME Agency: NA  Prior Living Arrangements/Services Living arrangements for the past 2 months: Homeless Lives with:: Self Patient language and need for interpreter reviewed:: Yes Do you feel safe going back to the place where you live?: Yes      Need for Family Participation in Patient Care: No (Comment) Care giver support system in place?: No (comment) Criminal Activity/Legal  Involvement Pertinent to Current Situation/Hospitalization: No - Comment as needed  Activities of Daily Living Home Assistive Devices/Equipment: None ADL Screening (condition at time of admission) Patient's cognitive ability adequate to safely complete daily activities?: Yes Is the patient deaf or have difficulty hearing?: No Does the patient have difficulty seeing, even when wearing glasses/contacts?: No Does the patient have difficulty concentrating, remembering, or making decisions?: No Patient able to express need for assistance with ADLs?: Yes Does the patient have difficulty dressing or bathing?: No Independently performs ADLs?: Yes (appropriate for developmental age) Does the patient have difficulty walking or climbing stairs?: No Weakness of Legs: None Weakness of Arms/Hands: Left (left hand is weak)  Emotional Assessment Attitude/Demeanor/Rapport: Engaged Affect (typically observed): Accepting Orientation: : Oriented to Self, Oriented to Place, Oriented to  Time, Oriented to Situation Alcohol / Substance Use: Illicit Drugs  Admission diagnosis:  Cellulitis of finger of right hand [L03.011] Cellulitis of multiple sites of left hand and fingers [L03.012, L03.114] Patient Active Problem List   Diagnosis Date Noted   Cellulitis of multiple sites of left hand and fingers 06/21/2021   Paroxysmal atrial fibrillation (HCC) 06/21/2021   Atrial fibrillation with RVR ([place]) 05/28/2021   Acute hypercapnic respiratory failure ([place]) 04/14/2021   Hypercapnia    Acute respiratory failure ([place]) 03/28/2021   Acute on chronic respiratory failure with hypercapnia (HCC) 11/24/2020   Type 2 diabetes mellitus ([place]) 11/24/2020   Class 2 obesity due to excess calories with body mass index (BMI) of 35.0 to 35.9 in adult 11/24/2020   Acute encephalopathy 11/24/2020  Syncope    Acute pulmonary edema ([place]) 11/22/2020   Obesity, Class III, BMI 40-49.9 (morbid obesity) ([place]) 11/22/2020   Obstructive  sleep apnea 11/22/2020   Frequent falls 11/22/2020   Obesity hypoventilation syndrome ([place]) 10/02/2020   Acute respiratory failure with hypercapnia ([place]) 09/22/2020   History of COVID-19 09/22/2020   Abnormal chest x-ray 09/22/2020   Obesity (BMI 30.0-34.9) 09/22/2020   HTN (hypertension) 06/25/2020   Acute on chronic respiratory failure with hypoxia and hypercapnia ([place]) 06/25/2020   Pneumonia due to COVID-19 virus 06/22/2020   Acute respiratory failure with hypoxia ([place]) 06/22/2020   Acute heart failure with preserved ejection fraction (HFpEF) ([place]) 11/30/2019   Acute on chronic diastolic CHF (congestive heart failure) ([place])    Uncontrolled hypertension 10/21/2019   COPD with acute exacerbation ([place]) 10/20/2019   COPD exacerbation ([place]) 10/19/2019   Sinus bradycardia    Acute exacerbation of chronic obstructive pulmonary disease (COPD) ([place]) 10/17/2019   Elevated liver enzymes 06/11/2017   Abnormal liver function tests 06/10/2017   Chronic diastolic CHF (congestive heart failure) ([place]) 06/10/2017   Hypokalemia 06/10/2017   Hypomagnesemia 06/10/2017   Vertebral osteomyelitis, chronic ([place]) 05/31/2017   Marijuana use 05/31/2017   Osteomyelitis ([place]) 05/24/2017   Discitis 05/24/2017   Diskitis 03/05/2017   Cocaine use    Alcohol abuse    Polysubstance abuse ([place])    Epidural abscess    Discitis thoracic region 02/26/2017   Thoracic back pain    Tobacco abuse    Essential hypertension    Fungal endocarditis    Suicidal ideation    IVDU (intravenous drug user)    Chronic bilateral low back pain without sciatica    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[place])    Vertebral osteomyelitis ([place]) 02/16/2017   Shortness of breath 10/12/2015   Cocaine abuse ([place]) 10/09/2015   Homeless single person    COPD (chronic obstructive pulmonary disease) ([place]) 10/08/2015   PCP:  [name], NP Pharmacy:   [facility] ([place] [place] [address] [place] ([place]) [place] [address] Phone: [phone] Fax: [phone]  Readmission Risk Interventions Readmission Risk Prevention Plan 10/10/2020 06/27/2020 10/21/2019  Transportation Screening Complete Complete Complete  PCP or Specialist Appt within 3-5 Days - - Complete  HRI or [place] - - Complete  Social Work Consult for Recovery Care Planning/Counseling - - Complete  Palliative Care Screening - - Complete  Medication Review Press photographer) Complete Complete Complete  PCP or Specialist appointment within 3-5 days of discharge Complete Complete -  [place] or Home Care Consult Complete Complete -  SW Recovery Care/Counseling Consult Complete Complete -  Palliative Care Screening Not Applicable Not Applicable -  [place] Not Applicable Not Applicable -  Some recent data might be hidden

## 2021-06-24 DIAGNOSIS — I480 Paroxysmal atrial fibrillation: Secondary | ICD-10-CM

## 2021-06-24 DIAGNOSIS — F1490 Cocaine use, unspecified, uncomplicated: Secondary | ICD-10-CM

## 2021-06-24 DIAGNOSIS — I10 Essential (primary) hypertension: Secondary | ICD-10-CM

## 2021-06-24 LAB — GLUCOSE, CAPILLARY
Glucose-Capillary: 125 mg/dL — ABNORMAL HIGH (ref 70–99)
Glucose-Capillary: 96 mg/dL (ref 70–99)
Glucose-Capillary: 123 mg/dL — ABNORMAL HIGH (ref 70–99)

## 2021-06-24 MED ORDER — CEFADROXIL 500 MG PO CAPS
1000.0000 mg | ORAL_CAPSULE | Freq: Two times a day (BID) | ORAL | Status: DC
  Administered 2021-06-24 – 2021-06-26 (×4): 1000 mg via ORAL
  Filled 2021-06-24 (×4): qty 2

## 2021-06-24 MED ORDER — HYDRALAZINE HCL 25 MG PO TABS
25.0000 mg | ORAL_TABLET | Freq: Four times a day (QID) | ORAL | Status: DC | PRN
  Administered 2021-06-24 – 2021-06-26 (×3): 25 mg via ORAL
  Filled 2021-06-24 (×3): qty 1

## 2021-06-24 MED ORDER — LOSARTAN POTASSIUM 25 MG PO TABS
25.0000 mg | ORAL_TABLET | Freq: Every day | ORAL | Status: DC
  Administered 2021-06-24 – 2021-06-26 (×3): 25 mg via ORAL
  Filled 2021-06-24 (×3): qty 1

## 2021-06-24 MED ORDER — SULFAMETHOXAZOLE-TRIMETHOPRIM 800-160 MG PO TABS: 1.0000 | ORAL_TABLET | Freq: Two times a day (BID) | ORAL | Status: DC

## 2021-06-24 MED ORDER — DOXYCYCLINE HYCLATE 100 MG PO TABS
100.0000 mg | ORAL_TABLET | Freq: Two times a day (BID) | ORAL | Status: DC
  Administered 2021-06-24 – 2021-06-26 (×4): 100 mg via ORAL
  Filled 2021-06-24 (×4): qty 1

## 2021-06-24 NOTE — Progress Notes (Signed)
[facility] Hospital[facility]  PROGRESS NOTE  [name]. [id number]:[id number] DOB: [birth year] DOA: 06/21/2021 PCP: [name], NP   Brief HPI:   56 year old male with a history of COPD, paroxysmal atrial fibrillation not on anticoagulation, chronic diastolic heart failure, homelessness, polysubstance abuse, IV drug use, smoker, occasional drinker came with complaints of left hand swelling and pain.  He reports of injecting cocaine on the left wrist and left elbow few days ago.  Hand surgery was consulted and patient underwent incision and drainage of the paronychia and felon of the left long finger.  I&D of the extensor tendon sheath of the left long finger.    Subjective   Patient complains of mild pain in the hand.   Assessment/Plan:    Left hand abscess -Insetting of IV drug use -X-ray of the hand did not show evidence of fracture -Patient was empirically started on vancomycin and ceftriaxone -Hand surgery consulted, patient underwent incision drainage of the paronychia and felon of the left long finger; IND of the extensor tendon sheath of the left long finger -Patient is currently on vancomycin and ceftriaxone -Abscess culture was not sent to lab after surgery -Has been on IV antibiotics for 4 days -We will discontinue IV antibiotics and start Bactrim DS and cefadroxil  History of paroxysmal atrial fibrillation -Currently in normal sinus rhythm -Not on anticoagulant therapy due to noncompliance  Hypertension -Blood pressure elevated despite being on amlodipine 10 mg daily -We will add losartan 25 mg daily -Add hydralazine 25 mg p.o. every 6 hours as needed for BP greater than 160/100  History of drug use -Uses cocaine -Counseled on cessation  History of COPD -Stable -Continue bronchodilators as needed  Alcohol abuse -Drinks alcohol every day -Not in alcohol withdrawal at this time -Continue thiamine, folate  Diabetes mellitus type 2 -Continue sliding  scale insulin with NovoLog  Homeless issues -TOC consulted     Medications     amLODipine  10 mg Oral Daily   folic acid  1 mg Oral Daily   heparin  5,000 Units Subcutaneous Q8H   insulin aspart  0-9 Units Subcutaneous TID WC   losartan  25 mg Oral Daily   nicotine  14 mg Transdermal Daily   thiamine  100 mg Oral Daily     Data Reviewed:   CBG:  Recent Labs  Lab 06/23/21 1208 06/23/21 1649 06/23/21 1949 06/24/21 0754 06/24/21 1208  GLUCAP 200* 260* 100* 125* 96    SpO2: 99 %    Vitals:   06/23/21 1951 06/24/21 0521 06/24/21 0915 06/24/21 1317  BP:  (!) 153/70 (!) 196/94 (!) 197/93  Pulse:  (!) 50 (!) 51 (!) 54  Resp:  19 (!) 22 (!) 22  Temp: 98.5 F (36.9 C) 98.5 F (36.9 C)  98.6 F (37 C)  TempSrc: Oral Oral  Oral  SpO2:  93% 95% 99%  Weight:      Height:          Data Reviewed:  Basic Metabolic Panel: Recent Labs  Lab 06/21/21 1412 06/22/21 0341 06/23/21 0317  NA 133* 135  --   K 3.5 3.5  --   CL 99 100  --   CO2 26 29  --   GLUCOSE 157* 115*  --   BUN 11 15  --   CREATININE 0.65 0.69 0.81  CALCIUM 9.0 8.9  --     CBC: Recent Labs  Lab 06/21/21 1412 06/22/21 0341  WBC 7.0 5.5  NEUTROABS  5.4  --   HGB 16.1 15.0  HCT 45.9 44.1  MCV 101.1* 102.6*  PLT 168 168       Antibiotics: Anti-infectives (From admission, onward)    Start     Dose/Rate Route Frequency Ordered Stop   06/22/21 2300  ceFAZolin (ANCEF) IVPB 2g/100 mL premix  Status:  Discontinued        2 g 200 mL/hr over 30 Minutes Intravenous On call to O.R. 06/22/21 2247 06/22/21 2307   06/22/21 1800  cefTRIAXone (ROCEPHIN) 1 g in sodium chloride 0.9 % 100 mL IVPB        1 g 200 mL/hr over 30 Minutes Intravenous Every 24 hours 06/21/21 2116     06/22/21 0800  vancomycin (VANCOCIN) IVPB 1000 mg/200 mL premix  Status:  Discontinued        1,000 mg 200 mL/hr over 60 Minutes Intravenous Every 12 hours 06/21/21 2119 06/21/21 2125   06/22/21 0600  vancomycin  (VANCOREADY) IVPB 1250 mg/250 mL        1,250 mg 166.7 mL/hr over 90 Minutes Intravenous Every 12 hours 06/21/21 2125     06/21/21 1915  cefTRIAXone (ROCEPHIN) 1 g in sodium chloride 0.9 % 100 mL IVPB        1 g 200 mL/hr over 30 Minutes Intravenous  Once 06/21/21 1913 06/21/21 2142   06/21/21 1915  vancomycin (VANCOCIN) IVPB 1000 mg/200 mL premix        1,000 mg 200 mL/hr over 60 Minutes Intravenous  Once 06/21/21 1913 06/21/21 2143        DVT prophylaxis: Heparin  Code Status: Full code  Family Communication: No family at bedside      Objective    Physical Examination:   General-appears in no acute distress Heart-S1-S2, regular, no murmur auscultated Lungs-clear to auscultation bilaterally, no wheezing or crackles auscultated Abdomen-soft, nontender, no organomegaly Extremities-left hand in dressing, mild swelling noted on the dorsal aspect of left hand. Neuro-alert, oriented x3, no focal deficit noted  Status is: Inpatient , s/p incision today of left hand abscess             [facility] If 7PM-7AM, please contact night-coverage at [email or web address], Office  [phone]   06/24/2021, 2:43 PM  LOS: 2 days

## 2021-06-24 NOTE — Plan of Care (Signed)
  Problem: Coping: Goal: Level of anxiety will decrease Outcome: Progressing   Problem: Pain Managment: Goal: General experience of comfort will improve Outcome: Progressing   

## 2021-06-24 NOTE — Plan of Care (Signed)
  Problem: Education: Goal: Knowledge of General Education information will improve Description: Including pain rating scale, medication(s)/side effects and non-pharmacologic comfort measures Outcome: Progressing   Problem: Activity: Goal: Risk for activity intolerance will decrease Outcome: Progressing   Problem: Pain Managment: Goal: General experience of comfort will improve Outcome: Progressing   

## 2021-06-25 ENCOUNTER — Encounter (HOSPITAL_COMMUNITY): Payer: Self-pay | Admitting: General Surgery

## 2021-06-25 LAB — CBC
HCT: 47.1 % (ref 39.0–52.0)
Hemoglobin: 16.3 g/dL (ref 13.0–17.0)
MCH: 35.3 pg — ABNORMAL HIGH (ref 26.0–34.0)
MCHC: 34.6 g/dL (ref 30.0–36.0)
MCV: 101.9 fL — ABNORMAL HIGH (ref 80.0–100.0)
Platelets: 198 10*3/uL (ref 150–400)
RBC: 4.62 MIL/uL (ref 4.22–5.81)
RDW: 13.8 % (ref 11.5–15.5)
WBC: 5.5 10*3/uL (ref 4.0–10.5)
nRBC: 0 % (ref 0.0–0.2)

## 2021-06-25 LAB — BASIC METABOLIC PANEL
Anion gap: 7 (ref 5–15)
BUN: 12 mg/dL (ref 6–20)
CO2: 29 mmol/L (ref 22–32)
Calcium: 9 mg/dL (ref 8.9–10.3)
Chloride: 98 mmol/L (ref 98–111)
Creatinine, Ser: 0.76 mg/dL (ref 0.61–1.24)
GFR, Estimated: >60 mL/min (ref >60)
Glucose, Bld: 121 mg/dL — ABNORMAL HIGH (ref 70–99)
Potassium: 3.9 mmol/L (ref 3.5–5.1)
Sodium: 134 mmol/L — ABNORMAL LOW (ref 135–145)

## 2021-06-25 NOTE — Progress Notes (Signed)
[facility] Hospitalist  PROGRESS NOTE  [name]. KZS:[id number] DOB: 1966-03-15 DOA: 06/21/2021 PCP: [name], NP   Brief HPI:   56 year old male with a history of COPD, paroxysmal atrial fibrillation not on anticoagulation, chronic diastolic heart failure, homelessness, polysubstance abuse, IV drug use, smoker, occasional drinker came with complaints of left hand swelling and pain.  He reports of injecting cocaine on the left wrist and left elbow few days ago.  Hand surgery was consulted and patient underwent incision and drainage of the paronychia and felon of the left long finger.  I&D of the extensor tendon sheath of the left long finger.    Subjective   Patient seen and examined, denies any complaints.  Hand swelling has improved.   Assessment/Plan:    Left hand abscess -Insetting of IV drug use -X-ray of the hand did not show evidence of fracture -Patient was empirically started on vancomycin and ceftriaxone -Hand surgery consulted, patient underwent incision drainage of the paronychia and felon of the left long finger; IND of the extensor tendon sheath of the left long finger -Patient is currently on vancomycin and ceftriaxone -Abscess culture was not sent to lab after surgery -Has been on IV antibiotics for 4 days -IV antibiotics were discontinued and patient started on Bactrim DS and cefadroxil  History of paroxysmal atrial fibrillation -Currently in normal sinus rhythm -Not on anticoagulant therapy due to noncompliance  Hypertension -Blood pressure elevated despite being on amlodipine 10 mg daily -Improved after adding losartan 25 mg daily -Continue hydralazine 25 mg p.o. every 6 hours as needed for BP greater than 160/100  History of drug use -Uses cocaine -Counseled on cessation  History of COPD -Stable -Continue bronchodilators as needed  Alcohol abuse -Drinks alcohol every day -Not in alcohol withdrawal at this time -Continue  thiamine, folate  Diabetes mellitus type 2 -Continue sliding scale insulin with NovoLog  Homeless issues -TOC consulted     Medications     amLODipine  10 mg Oral Daily   cefadroxil  1,000 mg Oral BID   doxycycline  100 mg Oral D32K   folic acid  1 mg Oral Daily   heparin  5,000 Units Subcutaneous Q8H   insulin aspart  0-9 Units Subcutaneous TID WC   losartan  25 mg Oral Daily   nicotine  14 mg Transdermal Daily   thiamine  100 mg Oral Daily     Data Reviewed:   CBG:  Recent Labs  Lab 06/23/21 1649 06/23/21 1949 06/24/21 0754 06/24/21 1208 06/24/21 2117  GLUCAP 260* 100* 125* 96 123*    SpO2: 99 %    Vitals:   06/24/21 0915 06/24/21 1317 06/24/21 2115 06/25/21 0555  BP: (!) 196/94 (!) 197/93 (!) 183/88 (!) 149/93  Pulse: (!) 51 (!) 54 62 65  Resp: (!) 22 (!) 22 18 17   Temp:  98.6 F (37 C) 98.4 F (36.9 C) 98.9 F (37.2 C)  TempSrc:  Oral  Oral  SpO2: 95% 99% 96% 99%  Weight:      Height:          Data Reviewed:  Basic Metabolic Panel: Recent Labs  Lab 06/21/21 1412 06/22/21 0341 06/23/21 0317 06/25/21 0339  NA 133* 135  --  134*  K 3.5 3.5  --  3.9  CL 99 100  --  98  CO2 26 29  --  29  GLUCOSE 157* 115*  --  121*  BUN 11 15  --  12  CREATININE 0.65 0.69 0.81 0.76  CALCIUM 9.0 8.9  --  9.0    CBC: Recent Labs  Lab 06/21/21 1412 06/22/21 0341 06/25/21 0339  WBC 7.0 5.5 5.5  NEUTROABS 5.4  --   --   HGB 16.1 15.0 16.3  HCT 45.9 44.1 47.1  MCV 101.1* 102.6* 101.9*  PLT 168 168 198   Microbiology Blood cultures x2 obtained on 06/21/2021 are negative to date Abscess culture was never sent to the lab    Antibiotics: Anti-infectives (From admission, onward)    Start     Dose/Rate Route Frequency Ordered Stop   06/24/21 1800  cefadroxil (DURICEF) capsule 1,000 mg        1,000 mg Oral 2 times daily 06/24/21 1450     06/24/21 1800  doxycycline (VIBRA-TABS) tablet 100 mg        100 mg Oral Every 12 hours 06/24/21 1614      06/24/21 1545  sulfamethoxazole-trimethoprim (BACTRIM DS) 800-160 MG per tablet 1 tablet  Status:  Discontinued        1 tablet Oral Every 12 hours 06/24/21 1450 06/24/21 1614   06/22/21 2300  ceFAZolin (ANCEF) IVPB 2g/100 mL premix  Status:  Discontinued        2 g 200 mL/hr over 30 Minutes Intravenous On call to O.R. 06/22/21 2247 06/22/21 2307   06/22/21 1800  cefTRIAXone (ROCEPHIN) 1 g in sodium chloride 0.9 % 100 mL IVPB  Status:  Discontinued        1 g 200 mL/hr over 30 Minutes Intravenous Every 24 hours 06/21/21 2116 06/24/21 1450   06/22/21 0800  vancomycin (VANCOCIN) IVPB 1000 mg/200 mL premix  Status:  Discontinued        1,000 mg 200 mL/hr over 60 Minutes Intravenous Every 12 hours 06/21/21 2119 06/21/21 2125   06/22/21 0600  vancomycin (VANCOREADY) IVPB 1250 mg/250 mL  Status:  Discontinued        1,250 mg 166.7 mL/hr over 90 Minutes Intravenous Every 12 hours 06/21/21 2125 06/24/21 1450   06/21/21 1915  cefTRIAXone (ROCEPHIN) 1 g in sodium chloride 0.9 % 100 mL IVPB        1 g 200 mL/hr over 30 Minutes Intravenous  Once 06/21/21 1913 06/21/21 2142   06/21/21 1915  vancomycin (VANCOCIN) IVPB 1000 mg/200 mL premix        1,000 mg 200 mL/hr over 60 Minutes Intravenous  Once 06/21/21 1913 06/21/21 2143        DVT prophylaxis: Heparin  Code Status: Full code  Family Communication: No family at bedside      Objective    Physical Examination:   General-appears in no acute distress Heart-S1-S2, regular, no murmur auscultated Lungs-clear to auscultation bilaterally, no wheezing or crackles auscultated Abdomen-soft, nontender, no organomegaly Extremities-left hand middle finger in dressing Neuro-alert, oriented x3, no focal deficit noted  Status is: Inpatient , s/p incision today of left hand abscess        [place]   [facility] If 7PM-7AM, please contact night-coverage at [email or web address], Office  [phone]   06/25/2021, 11:56 AM  LOS: 3  days

## 2021-06-25 NOTE — Plan of Care (Signed)
  Problem: Education: Goal: Knowledge of General Education information will improve Description: Including pain rating scale, medication(s)/side effects and non-pharmacologic comfort measures Outcome: Progressing   Problem: Activity: Goal: Risk for activity intolerance will decrease Outcome: Progressing   Problem: Pain Managment: Goal: General experience of comfort will improve Outcome: Progressing   

## 2021-06-25 NOTE — Progress Notes (Signed)
Patient refusing blood sugar checks and insulin coverage. States he "isnt going to do it at home, why do it here" Explained the importance/reasoning behind it and patient still refused.

## 2021-06-25 NOTE — Progress Notes (Signed)
Pt refused bedtime blood glucose check. Pt educated on importance of check to nil effect.

## 2021-06-25 NOTE — Plan of Care (Signed)
°  Problem: Clinical Measurements: Goal: Ability to avoid or minimize complications of infection will improve Outcome: Progressing   Problem: Skin Integrity: Goal: Skin integrity will improve Outcome: Progressing   Problem: Clinical Measurements: Goal: Will remain free from infection Outcome: Progressing Goal: Diagnostic test results will improve Outcome: Progressing Goal: Respiratory complications will improve Outcome: Progressing Goal: Cardiovascular complication will be avoided Outcome: Progressing   Problem: Activity: Goal: Risk for activity intolerance will decrease Outcome: Progressing   Problem: Nutrition: Goal: Adequate nutrition will be maintained Outcome: Progressing   Problem: Coping: Goal: Level of anxiety will decrease Outcome: Progressing   Problem: Elimination: Goal: Will not experience complications related to bowel motility Outcome: Progressing Goal: Will not experience complications related to urinary retention Outcome: Progressing   Problem: Pain Managment: Goal: General experience of comfort will improve Outcome: Progressing   Problem: Safety: Goal: Ability to remain free from injury will improve Outcome: Progressing   Problem: Skin Integrity: Goal: Risk for impaired skin integrity will decrease Outcome: Progressing

## 2021-06-25 NOTE — Transfer of Care (Signed)
Immediate Anesthesia Transfer of Care Note  Patient: [name].  Procedure(s) Performed: IRRIGATION AND DEBRIDEMENT LEFT HAND (Left)  Patient Location: PACU  Anesthesia Type:MAC combined with regional for post-op pain  Level of Consciousness: awake  Airway & Oxygen Therapy: Patient Spontanous Breathing and Patient connected to face mask oxygen  Post-op Assessment: Report given to RN and Post -op Vital signs reviewed and stable  Post vital signs: Reviewed and stable  Last Vitals:  Vitals Value Taken Time  BP 166/70 06/25/21 1356  Temp 37.2 C 06/25/21 1356  Pulse 60 06/25/21 1356  Resp 18 06/25/21 1356  SpO2 95 % 06/25/21 1356    Last Pain:  Vitals:   06/25/21 1501  TempSrc:   PainSc: 2       Patients Stated Pain Goal: 3 (39/76/73 [id number])  Complications: No notable events documented.

## 2021-06-26 ENCOUNTER — Encounter (HOSPITAL_COMMUNITY): Payer: Self-pay | Admitting: General Surgery

## 2021-06-26 DIAGNOSIS — I5032 Chronic diastolic (congestive) heart failure: Secondary | ICD-10-CM

## 2021-06-26 LAB — CULTURE, BLOOD (ROUTINE X 2)
Culture: NO GROWTH
Special Requests: ADEQUATE

## 2021-06-26 MED ORDER — ACETAMINOPHEN 500 MG PO TABS: 500.0000 mg | ORAL_TABLET | Freq: Four times a day (QID) | ORAL | 0 refills | Status: AC | PRN

## 2021-06-26 MED ORDER — LOSARTAN POTASSIUM 25 MG PO TABS: 25.0000 mg | ORAL_TABLET | Freq: Every day | ORAL | 3 refills | Status: DC

## 2021-06-26 MED ORDER — BACITRACIN-NEOMYCIN-POLYMYXIN 400-5-5000 EX OINT
TOPICAL_OINTMENT | Freq: Every day | CUTANEOUS | Status: DC
  Filled 2021-06-26: qty 14.17

## 2021-06-26 MED ORDER — BACITRACIN-NEOMYCIN-POLYMYXIN 400-5-5000 EX OINT: TOPICAL_OINTMENT | Freq: Every day | CUTANEOUS | 0 refills | Status: AC

## 2021-06-26 MED ORDER — CEFADROXIL 500 MG PO CAPS: 1000.0000 mg | ORAL_CAPSULE | Freq: Two times a day (BID) | ORAL | 0 refills | Status: AC

## 2021-06-26 MED ORDER — AMLODIPINE BESYLATE 10 MG PO TABS: 10.0000 mg | ORAL_TABLET | Freq: Every day | ORAL | 3 refills | Status: DC

## 2021-06-26 MED ORDER — DOXYCYCLINE HYCLATE 100 MG PO TABS: 100.0000 mg | ORAL_TABLET | Freq: Two times a day (BID) | ORAL | 0 refills | Status: AC

## 2021-06-26 NOTE — Plan of Care (Signed)
°  Problem: Clinical Measurements: Goal: Ability to avoid or minimize complications of infection will improve Outcome: Progressing   Problem: Health Behavior/Discharge Planning: Goal: Ability to manage health-related needs will improve Outcome: Progressing   Problem: Clinical Measurements: Goal: Will remain free from infection Outcome: Progressing   Problem: Coping: Goal: Level of anxiety will decrease Outcome: Progressing   Problem: Pain Managment: Goal: General experience of comfort will improve Outcome: Progressing

## 2021-06-26 NOTE — Discharge Summary (Signed)
Physician Discharge Summary   Patient: [name]. MRN: [id number] DOB: 1965-10-15  Admit date:     06/21/2021  Discharge date: 06/26/21  Discharge Physician: Oswald Hillock   PCP: [name], NP   Recommendations at discharge:   Follow-up hand surgeon Dr. Lenon Curt  in 1 week  Discharge Diagnoses: Principal Problem:   Cellulitis of multiple sites of left hand and fingers Active Problems:   COPD (chronic obstructive pulmonary disease) (HCC)   Cocaine use   Chronic diastolic CHF (congestive heart failure) (HCC)   Type 2 diabetes mellitus (HCC)   Paroxysmal atrial fibrillation ([place])  Resolved Problems:   * No resolved hospital problems. *   Hospital Course: [name]. is a 56 y.o. male with medical history significant for COPD, paroxysmal atrial fibrillation not on anticoagulation (due to nonadherence and active drug use), chronic diastolic CHF (EF 62-37%), homelessness, polysubstance use disorder who is admitted with left hand cellulitis.  Assessment and Plan:  *Left hand abscess -Insetting of IV drug use -X-ray of the hand did not show evidence of fracture -Patient was empirically started on vancomycin and ceftriaxone -Hand surgery consulted, patient underwent incision drainage of the paronychia and felon of the left long finger; IND of the extensor tendon sheath of the left long finger -Patient is currently on vancomycin and ceftriaxone -Abscess culture was not sent to lab after surgery -Has been on IV antibiotics for 4 days -IV antibiotics were discontinued and patient started on doxycycline and cefadroxil -Discussed with hand surgeon Dr. Lenon Curt, he recommends to continue antibiotics for 5 more days and follow-up in his clinic in 1 week.  Patient will continue to do dressing changes daily.   History of paroxysmal atrial fibrillation -Currently in normal sinus rhythm -Not on anticoagulant therapy due to noncompliance   Hypertension -Blood  pressure elevated despite being on amlodipine 10 mg daily -Improved after adding losartan 25 mg daily -We will discharge on amlodipine, losartan -   History of drug use -Uses cocaine -Counseled on cessation   History of COPD -Stable -Continue bronchodilators as needed   Alcohol abuse -Drinks alcohol every day -Not in alcohol withdrawal at this time -Continue thiamine, folate   Diabetes mellitus type 2 -Patient not on medications at home -Last hemoglobin A1c was 6.1   Homeless issues -TOC consulted        Consultants: Orthopedic surgery Procedures performed: Incision and drainage of left middle finger abscess Disposition: Home Diet recommendation:  Discharge Diet Orders (From admission, onward)     Start     Ordered   06/26/21 0000  Diet - low sodium heart healthy        06/26/21 1056           Carb modified diet  DISCHARGE MEDICATION: Allergies as of 06/26/2021       Reactions   Penicillins    Has tolerated cefepime 5/22 From childhood: Has patient had a PCN reaction causing immediate rash, facial/tongue/throat swelling, SOB or lightheadedness with hypotension: Unknown Has patient had a PCN reaction causing severe rash involving mucus membranes or skin necrosis: Unknown Has patient had a PCN reaction that required hospitalization: Unknown Has patient had a PCN reaction occurring within the last 10 years: No If all of the above answers are "NO", then may proceed with Cephalosporin use.        Medication List     TAKE these medications    acetaminophen 500 MG tablet Commonly known as: TYLENOL Take 1  tablet (500 mg total) by mouth every 6 (six) hours as needed for mild pain, moderate pain, fever or headache (or Fever >/= 101).   albuterol (2.5 MG/3ML) 0.083% nebulizer solution Commonly known as: PROVENTIL Inhale 3 mLs (2.5 mg total) into the lungs every 6 (six) hours as needed for wheezing or shortness of breath.   amLODipine 10 MG  tablet Commonly known as: NORVASC Take 1 tablet (10 mg total) by mouth daily. Start taking on: June 27, 2021   cefadroxil 500 MG capsule Commonly known as: DURICEF Take 2 capsules (1,000 mg total) by mouth 2 (two) times daily.   doxycycline 100 MG tablet Commonly known as: VIBRA-TABS Take 1 tablet (100 mg total) by mouth every 12 (twelve) hours.   folic acid 1 MG tablet Commonly known as: FOLVITE Take 1 tablet (1 mg total) by mouth daily.   losartan 25 MG tablet Commonly known as: COZAAR Take 1 tablet (25 mg total) by mouth daily. Start taking on: June 27, 2021   multivitamin with minerals Tabs tablet Take 1 tablet by mouth daily.   neomycin-bacitracin-polymyxin ointment Commonly known as: NEOSPORIN Apply topically daily. Apply to finger daily with dressing change   thiamine 100 MG tablet Take 1 tablet (100 mg total) by mouth daily.   umeclidinium bromide 62.5 MCG/ACT Aepb Commonly known as: INCRUSE ELLIPTA Inhale 1 puff into the lungs daily.               Discharge Care Instructions  (From admission, onward)           Start     Ordered   06/26/21 0000  Discharge wound care:       Comments: Daily dressing change recommendations  wash hands/fingers with soap and water, apply a small amount of antibiotic ointment over nailbed and sutures in the middle finger.  Place a small piece of Xeroform over fingertip and gently wrapped finger with gauze.  Change daily.   06/26/21 1056             Discharge Exam: Filed Weights   06/21/21 1337  Weight: 99.8 kg   General-appears in no acute distress Heart-S1-S2, regular, no murmur auscultated Lungs-clear to auscultation bilaterally, no wheezing or crackles auscultated Abdomen-soft, nontender, no organomegaly Extremities-no edema in the lower extremities Neuro-alert, oriented x3, no focal deficit noted  Condition at discharge: good  The results of significant diagnostics from this hospitalization  (including imaging, microbiology, ancillary and laboratory) are listed below for reference.   Imaging Studies: DG Chest 2 View  Result Date: 06/07/2021 CLINICAL DATA:  56 year old male with chest pain and shortness of breath. Smoker. EXAM: CHEST - 2 VIEW COMPARISON:  CTA chest 05/28/2021 and earlier. FINDINGS: Somewhat decreased lung volumes since 2010 with questionable subpleural pulmonary scarring on the CT earlier this month. Although increased AP dimension to the chest since 2010. Mediastinal contours are stable and within normal limits; mild tortuosity of the thoracic aorta. Visualized tracheal air column is within normal limits. No pneumothorax, pulmonary edema, pleural effusion or confluent pulmonary opacity. Chronic but increased pulmonary interstitial markings since 2010. No acute osseous abnormality identified. Chronic distal right clavicle deformity. Chronic lower thoracic wedge compression fractures. Negative visible bowel gas. IMPRESSION: No acute cardiopulmonary abnormality. Suspect smoking related chronic lung disease. Electronically Signed   By: Genevie Ann M.D.   On: 06/07/2021 05:44   CT Angio Chest Pulmonary Embolism (PE) W or WO Contrast  Result Date: 05/28/2021 CLINICAL DATA:  Atrial fibrillation and chest pain. Rule out  pulmonary embolus. EXAM: CT ANGIOGRAPHY CHEST WITH CONTRAST TECHNIQUE: Multidetector CT imaging of the chest was performed using the standard protocol during bolus administration of intravenous contrast. Multiplanar CT image reconstructions and MIPs were obtained to evaluate the vascular anatomy. CONTRAST:  22mL OMNIPAQUE IOHEXOL 350 MG/ML SOLN COMPARISON:  08/03/2009 FINDINGS: Cardiovascular: Satisfactory opacification of the pulmonary arteries to the segmental level. No evidence of pulmonary embolism. Heart size upper limits of normal. Aortic atherosclerosis and coronary artery calcifications. Mediastinum/Nodes: No enlarged mediastinal, hilar, or axillary lymph nodes.  Thyroid gland, trachea, and esophagus demonstrate no significant findings. Lungs/Pleura: No pleural effusion, airspace consolidation, or pneumothorax. No interstitial edema identified. No suspicious lung nodules Upper Abdomen: No acute abnormality. Partially visualized cyst arising off the upper pole of left kidney measures 5.3 cm. Musculoskeletal: No chest wall abnormality. No acute or significant osseous findings. Chronic wedge deformity is identified at the T11-T12 level with ankylosis of the disc space. This is favored to represent sequelae of remote discitis/osteomyelitis. Review of the MIP images confirms the above findings. IMPRESSION: 1. No evidence for acute pulmonary embolus. 2. Chronic wedge deformity at T11-T12 with ankylosis of the disc space. This is favored to represent sequelae of remote discitis/osteomyelitis. 3. Aortic Atherosclerosis (ICD10-I70.0). Coronary artery calcifications. Electronically Signed   By: Kerby Moors M.D.   On: 05/28/2021 14:42   DG Chest Port 1 View  Result Date: 05/28/2021 CLINICAL DATA:  Chest pain. EXAM: PORTABLE CHEST 1 VIEW COMPARISON:  04/13/2021. FINDINGS: Heart is enlarged. Atherosclerotic calcification of the aorta is noted. The pulmonary vasculature is mildly distended. Interstitial prominence is noted bilaterally. No consolidation, effusion, or pneumothorax. No acute osseous abnormality. IMPRESSION: 1. Cardiomegaly with mildly distended pulmonary vasculature. 2. Interstitial prominence bilaterally, possible edema or infiltrate. Electronically Signed   By: Brett Fairy M.D.   On: 05/28/2021 04:12   DG Hand Complete Left  Result Date: 06/21/2021 CLINICAL DATA:  Fall pain  and swelling hand EXAM: LEFT HAND - COMPLETE 3+ VIEW COMPARISON:  10/13/2015 FINDINGS: Negative for acute fracture. Two screws in the base of the first metacarpal unchanged from the prior study. Mild arthropathy the base of the thumb. No other significant arthropathy. No erosion. IMPRESSION:  Negative for fracture.  Surgical changes base of thumb. Electronically Signed   By: Franchot Gallo M.D.   On: 06/21/2021 13:55   ECHOCARDIOGRAM COMPLETE  Result Date: 05/29/2021    ECHOCARDIOGRAM REPORT   Patient Name:   Quillan Whitter. Date of Exam: 05/29/2021 Medical Rec #:  [id number]                 Height:       68.0 in Accession #:    [id number]                Weight:       222.4 lb Date of Birth:  1966-03-20                 BSA:          2.138 m Patient Age:    53 years                  BP:           147/88 mmHg Patient Gender: M                         HR:           66 bpm. Exam Location:  Inpatient  Procedure: 2D Echo, Cardiac Doppler and Color Doppler Indications:    elev. troponin  History:        Patient has prior history of Echocardiogram examinations, most                 recent 09/23/2020. CHF, COPD; Risk Factors:Hypertension and                 Diabetes.  Sonographer:    Beryle Beams Referring Phys: [id number] [place]  1. Left ventricular ejection fraction, by estimation, is 55 to 60%. The left ventricle has normal function. The left ventricle has no regional wall motion abnormalities. There is moderate left ventricular hypertrophy. Left ventricular diastolic parameters are consistent with Grade II diastolic dysfunction (pseudonormalization). Elevated left atrial pressure.  2. Right ventricular systolic function is normal. The right ventricular size is mildly enlarged. There is mildly elevated pulmonary artery systolic pressure. The estimated right ventricular systolic pressure is 12.4 mmHg.  3. Left atrial size was moderately dilated.  4. The mitral valve is normal in structure. No evidence of mitral valve regurgitation. No evidence of mitral stenosis.  5. The aortic valve was not well visualized. Aortic valve regurgitation is not visualized. Aortic valve sclerosis/calcification is present, without any evidence of aortic stenosis.  6. The inferior vena cava is dilated in size  with >50% respiratory variability, suggesting right atrial pressure of 8 mmHg. FINDINGS  Left Ventricle: Left ventricular ejection fraction, by estimation, is 55 to 60%. The left ventricle has normal function. The left ventricle has no regional wall motion abnormalities. The left ventricular internal cavity size was normal in size. There is  moderate left ventricular hypertrophy. Left ventricular diastolic parameters are consistent with Grade II diastolic dysfunction (pseudonormalization). Elevated left atrial pressure. Right Ventricle: The right ventricular size is mildly enlarged. No increase in right ventricular wall thickness. Right ventricular systolic function is normal. There is mildly elevated pulmonary artery systolic pressure. The tricuspid regurgitant velocity is 2.71 m/s, and with an assumed right atrial pressure of 8 mmHg, the estimated right ventricular systolic pressure is 58.0 mmHg. Left Atrium: Left atrial size was moderately dilated. Right Atrium: Right atrial size was normal in size. Pericardium: There is no evidence of pericardial effusion. Presence of epicardial fat layer. Mitral Valve: The mitral valve is normal in structure. No evidence of mitral valve regurgitation. No evidence of mitral valve stenosis. Tricuspid Valve: The tricuspid valve is normal in structure. Tricuspid valve regurgitation is trivial. Aortic Valve: The aortic valve was not well visualized. Aortic valve regurgitation is not visualized. Aortic valve sclerosis/calcification is present, without any evidence of aortic stenosis. Aortic valve mean gradient measures 3.0 mmHg. Aortic valve peak gradient measures 6.4 mmHg. Aortic valve area, by VTI measures 4.01 cm. Pulmonic Valve: The pulmonic valve was not well visualized. Pulmonic valve regurgitation is not visualized. Aorta: The aortic root and ascending aorta are structurally normal, with no evidence of dilitation. Venous: The inferior vena cava is dilated in size with greater  than 50% respiratory variability, suggesting right atrial pressure of 8 mmHg. IAS/Shunts: The interatrial septum was not well visualized.  LEFT VENTRICLE PLAX 2D LVIDd:         5.10 cm      Diastology LVIDs:         3.40 cm      LV e' medial:    5.44 cm/s LV PW:         1.50 cm      LV E/e' medial:  14.9 LV IVS:        1.50 cm      LV e' lateral:   3.48 cm/s LVOT diam:     2.30 cm      LV E/e' lateral: 23.3 LV SV:         81 LV SV Index:   38 LVOT Area:     4.15 cm  LV Volumes (MOD) LV vol d, MOD A2C: 86.5 ml LV vol d, MOD A4C: 104.0 ml LV vol s, MOD A2C: 42.6 ml LV vol s, MOD A4C: 37.4 ml LV SV MOD A2C:     43.9 ml LV SV MOD A4C:     104.0 ml LV SV MOD BP:      54.8 ml RIGHT VENTRICLE         IVC TAPSE (M-mode): 2.3 cm  IVC diam: 1.70 cm LEFT ATRIUM              Index        RIGHT ATRIUM           Index LA diam:        4.90 cm  2.29 cm/m   RA Area:     16.30 cm LA Vol (A2C):   104.0 ml 48.64 ml/m  RA Volume:   41.20 ml  19.27 ml/m LA Vol (A4C):   72.2 ml  33.77 ml/m LA Biplane Vol: 88.1 ml  41.20 ml/m  AORTIC VALVE                    PULMONIC VALVE AV Area (Vmax):    3.69 cm     PV Vmax:       0.51 m/s AV Area (Vmean):   3.73 cm     PV Vmean:      32.300 cm/s AV Area (VTI):     4.01 cm     PV VTI:        0.079 m AV Vmax:           126.00 cm/s  PV Peak grad:  1.0 mmHg AV Vmean:          80.400 cm/s  PV Mean grad:  0.0 mmHg AV VTI:            0.203 m AV Peak Grad:      6.4 mmHg AV Mean Grad:      3.0 mmHg LVOT Vmax:         112.00 cm/s LVOT Vmean:        72.200 cm/s LVOT VTI:          0.196 m LVOT/AV VTI ratio: 0.97  AORTA Ao Root diam: 3.70 cm Ao Asc diam:  3.50 cm MITRAL VALVE               TRICUSPID VALVE MV Area (PHT): 4.65 cm    TR Peak grad:   29.4 mmHg MV Decel Time: 163 msec    TR Mean grad:   20.0 mmHg MV E velocity: 81.00 cm/s  TR Vmax:        271.00 cm/s MV A velocity: 77.60 cm/s  TR Vmean:       205.0 cm/s MV E/A ratio:  1.04                            SHUNTS  Systemic  VTI:  0.20 m                            Systemic Diam: 2.30 cm Oswaldo Milian MD Electronically signed by Oswaldo Milian MD Signature Date/Time: 05/29/2021/5:09:40 PM    Final     Microbiology: Results for orders placed or performed during the hospital encounter of 06/21/21  Resp Panel by RT-PCR (Flu A&B, Covid) Nasopharyngeal Swab     Status: None   Collection Time: 06/21/21  7:11 PM   Specimen: Nasopharyngeal Swab; Nasopharyngeal(NP) swabs in vial transport medium  Result Value Ref Range Status   SARS Coronavirus 2 by RT PCR NEGATIVE NEGATIVE Final    Comment: (NOTE) SARS-CoV-2 target nucleic acids are NOT DETECTED.  The SARS-CoV-2 RNA is generally detectable in upper respiratory specimens during the acute phase of infection. The lowest concentration of SARS-CoV-2 viral copies this assay can detect is 138 copies/mL. A negative result does not preclude SARS-Cov-2 infection and should not be used as the sole basis for treatment or other patient management decisions. A negative result may occur with  improper specimen collection/handling, submission of specimen other than nasopharyngeal swab, presence of viral mutation(s) within the areas targeted by this assay, and inadequate number of viral copies(<138 copies/mL). A negative result must be combined with clinical observations, patient history, and epidemiological information. The expected result is Negative.  Fact Sheet for Patients:  EntrepreneurPulse.com.au  Fact Sheet for Healthcare Providers:  IncredibleEmployment.be  This test is no t yet approved or cleared by the [place] FDA and  has been authorized for detection and/or diagnosis of SARS-CoV-2 by FDA under an Emergency Use Authorization (EUA). This EUA will remain  in effect (meaning this test can be used) for the duration of the COVID-19 declaration under Section 564(b)(1) of the Act, 21 U.S.C.section 360bbb-3(b)(1),  unless the authorization is terminated  or revoked sooner.       Influenza A by PCR NEGATIVE NEGATIVE Final   Influenza B by PCR NEGATIVE NEGATIVE Final    Comment: (NOTE) The Xpert Xpress SARS-CoV-2/FLU/RSV plus assay is intended as an aid in the diagnosis of influenza from Nasopharyngeal swab specimens and should not be used as a sole basis for treatment. Nasal washings and aspirates are unacceptable for Xpert Xpress SARS-CoV-2/FLU/RSV testing.  Fact Sheet for Patients: EntrepreneurPulse.com.au  Fact Sheet for Healthcare Providers: IncredibleEmployment.be  This test is not yet approved or cleared by the [place] FDA and has been authorized for detection and/or diagnosis of SARS-CoV-2 by FDA under an Emergency Use Authorization (EUA). This EUA will remain in effect (meaning this test can be used) for the duration of the COVID-19 declaration under Section 564(b)(1) of the Act, 21 U.S.C. section 360bbb-3(b)(1), unless the authorization is terminated or revoked.  Performed at [facility], [place] [address], [place], [place] [address]   Blood culture (routine x 2)     Status: None   Collection Time: 06/21/21  8:32 PM   Specimen: BLOOD  Result Value Ref Range Status   Specimen Description   Final    BLOOD RIGHT ARM Performed at [facility] [address], [place], [place] [address]    Special Requests   Final    BOTTLES DRAWN AEROBIC AND ANAEROBIC Blood Culture adequate volume Performed at [facility] [address], [place], [place] [address]    Culture   Final    NO GROWTH 5 DAYS  Performed at [facility], [place] [address] [place], [place] [address]    Report Status 06/26/2021 FINAL  Final  Blood culture (routine x 2)     Status: None (Preliminary result)   Collection Time: 06/21/21 10:57 PM   Specimen: BLOOD  Result Value Ref Range Status   Specimen Description    Final    BLOOD BLOOD RIGHT HAND Performed at [facility] [address], [place], [place] [address]    Special Requests   Final    BOTTLES DRAWN AEROBIC ONLY Blood Culture adequate volume Performed at [facility] [address] [place], [place] [address]    Culture   Final    NO GROWTH 4 DAYS Performed at [facility], [place] [address], [place],  [address]    Report Status PENDING  Incomplete    Labs: CBC: Recent Labs  Lab 06/21/21 1412 06/22/21 0341 06/25/21 0339  WBC 7.0 5.5 5.5  NEUTROABS 5.4  --   --   HGB 16.1 15.0 16.3  HCT 45.9 44.1 47.1  MCV 101.1* 102.6* 101.9*  PLT 168 168 315   Basic Metabolic Panel: Recent Labs  Lab 06/21/21 1412 06/22/21 0341 06/23/21 0317 06/25/21 0339  NA 133* 135  --  134*  K 3.5 3.5  --  3.9  CL 99 100  --  98  CO2 26 29  --  29  GLUCOSE 157* 115*  --  121*  BUN 11 15  --  12  CREATININE 0.65 0.69 0.81 0.76  CALCIUM 9.0 8.9  --  9.0   Liver Function Tests: Recent Labs  Lab 06/21/21 1412  AST 25  ALT 27  ALKPHOS 66  BILITOT 1.6*  PROT 7.5  ALBUMIN 3.7   CBG: Recent Labs  Lab 06/23/21 1649 06/23/21 1949 06/24/21 0754 06/24/21 1208 06/24/21 2117  GLUCAP 260* 100* 125* 96 123*    Discharge time spent: greater than 30 minutes.  Signed: Oswald Hillock, MD [facility] 06/26/2021

## 2021-06-26 NOTE — Clinical Social Work Note (Signed)
Patient left without waiting for transportation to be set up.

## 2021-06-26 NOTE — Anesthesia Postprocedure Evaluation (Signed)
Anesthesia Post Note  Patient: [name].  Procedure(s) Performed: IRRIGATION AND DEBRIDEMENT LEFT HAND (Left)     Patient location during evaluation: PACU Anesthesia Type: Regional Level of consciousness: awake and alert Pain management: pain level controlled Vital Signs Assessment: post-procedure vital signs reviewed and stable Respiratory status: spontaneous breathing, nonlabored ventilation and respiratory function stable Cardiovascular status: blood pressure returned to baseline Postop Assessment: no apparent nausea or vomiting Anesthetic complications: no   No notable events documented.               Marthenia Rolling

## 2021-06-26 NOTE — Plan of Care (Signed)

## 2021-06-27 ENCOUNTER — Telehealth: Payer: Self-pay

## 2021-06-27 LAB — CULTURE, BLOOD (ROUTINE X 2)
Culture: NO GROWTH
Special Requests: ADEQUATE

## 2021-06-27 NOTE — Telephone Encounter (Signed)
Transition Care Management Unsuccessful Follow-up Telephone Call  Date of discharge and from where:  06/26/2021, [facility]    Attempts:  1st Attempt  Reason for unsuccessful TCM follow-up call:  Left voice message with patient's mother # [phone]. She said she was not sure where he is but would have him return the call if he returns home.  Called # [phone], rings fast busy, unable to leave a message.  Called # [phone], voicemail not set up.

## 2021-06-27 NOTE — Telephone Encounter (Signed)
Transition Care Management Unsuccessful Follow-up Telephone Call  Date of discharge and from where:  06/26/2021 from [facility]  Attempts:  1st Attempt  Reason for unsuccessful TCM follow-up call:  Left voice message     Patient needs resources, mother Hendrick Pavich) stated that he is living in a tent right now and may not have a phone. Caren Griffins stated that she will try to get in touch with him.   I have other numbers for him and will try.

## 2021-06-28 ENCOUNTER — Telehealth: Payer: Self-pay

## 2021-06-28 NOTE — Telephone Encounter (Signed)
Transition Care Management Unsuccessful Follow-up Telephone Call  Date of discharge and from where:  06/26/2021, [facility]  Attempts:  2nd Attempt  Reason for unsuccessful TCM follow-up call:  Left voice message with patient's mother # [phone]. She said she was not sure where he is but would have him return the call if he returns home.   Called # [phone], rings fast busy, unable to leave a message.   Called # [phone], voicemail not set up.

## 2021-06-28 NOTE — Telephone Encounter (Signed)
Transition Care Management Unsuccessful Follow-up Telephone Call  Date of discharge and from where:  06/26/2021 from [facility]  Attempts:  2nd Attempt  Reason for unsuccessful TCM follow-up call:  No answer/busy  Tried to call mobile number listed and it is not available.

## 2021-06-29 ENCOUNTER — Telehealth: Payer: Self-pay

## 2021-06-29 NOTE — Telephone Encounter (Signed)
Transition Care Management Unsuccessful Follow-up Telephone Call  Date of discharge and from where:  06/26/2021 from [facility]  Attempts:  3rd Attempt  Reason for unsuccessful TCM follow-up call:  Unable to reach patient

## 2021-06-29 NOTE — Telephone Encounter (Signed)
Transition Care Management Unsuccessful Follow-up Telephone Call  Date of discharge and from where:  06/26/2021, [facility]  Attempts:  3rd Attempt  Reason for unsuccessful TCM follow-up call:  Left voice message  with patient's mother # [phone]. She said he called her last night and she told him to call me.  She also noted that his phone is not working right now but he seems to be doing okay.   Called # [phone], rings fast busy, unable to leave a message.   Called # [phone], voicemail not set up.   Letter sent to patient requesting he call [facility] to schedule a follow up appointment as we have not been able to reach him

## 2021-06-30 ENCOUNTER — Telehealth: Payer: Self-pay | Admitting: Primary Care

## 2021-06-30 NOTE — Telephone Encounter (Signed)
.. °  Medicaid Managed Care   Unsuccessful Outreach Note  06/30/2021 Name: [name]. MRN: [id number] DOB: 05-28-1965  Referred by: [name], NP Reason for referral : High Risk Managed Medicaid (I called the patient today to get him scheduled with the MM Team. His  mother answered and said she did not know when he would be back but to try on Monday.)   An unsuccessful telephone outreach was attempted today. The patient was referred to the case management team for assistance with care management and care coordination.   Follow Up Plan: The care management team will reach out to the patient again over the next 7 days.   [place]

## 2021-07-03 ENCOUNTER — Telehealth: Payer: Self-pay | Admitting: Primary Care

## 2021-07-03 NOTE — Telephone Encounter (Signed)
.. °  Medicaid Managed Care   Unsuccessful Outreach Note  07/03/2021 Name: [name]. MRN: [id number] DOB: 1966/04/28  Referred by: [name], NP Reason for referral : High Risk Managed Medicaid (I called the patient today to get him scheduled with the MM Team. His mom did not know where he was and said for me to try again tomorrow.)   A second unsuccessful telephone outreach was attempted today. The patient was referred to the case management team for assistance with care management and care coordination.   Follow Up Plan: The care management team will reach out to the patient again over the next 7 days.    [place]

## 2021-08-15 ENCOUNTER — Telehealth: Payer: Self-pay | Admitting: Primary Care

## 2021-08-15 NOTE — Telephone Encounter (Signed)
.. ?  Medicaid Managed Care  ? ?Unsuccessful Outreach Note ? ?08/15/2021 ?Name: [name] [name]. MRN: [id number] DOB: Oct 08, 1965 ? ?Referred by: [name], NP ?Reason for referral : High Risk Managed Medicaid (Attempted to reach the patient today to get him scheduled with the MM Team. Unable to leave a message.) ? ? ?Third unsuccessful telephone outreach was attempted today. The patient was referred to the case management team for assistance with care management and care coordination. The patient's primary care provider has been notified of our unsuccessful attempts to make or maintain contact with the patient. The care management team is pleased to engage with this patient at any time in the future should he/she be interested in assistance from the care management team.  ? ?Follow Up Plan: We have been unable to make contact with the patient for follow up. The care management team is available to follow up with the patient after provider conversation with the patient regarding recommendation for care management engagement and subsequent re-referral to the care management team.  ? ?Reita Chard ?Care Guide, High Risk Medicaid Managed Care ?Embedded Care Coordination ?[place]  ? ? ?SIGNATURE  ?

## 2022-02-02 ENCOUNTER — Emergency Department (HOSPITAL_COMMUNITY)

## 2022-02-02 ENCOUNTER — Emergency Department (HOSPITAL_COMMUNITY)
Admission: EM | Admit: 2022-02-02 | Discharge: 2022-02-02 | Disposition: A | Discharge status: Home / Self Care | Attending: Emergency Medicine | Admitting: Emergency Medicine

## 2022-02-02 ENCOUNTER — Other Ambulatory Visit: Payer: Self-pay

## 2022-02-02 ENCOUNTER — Encounter (HOSPITAL_COMMUNITY): Payer: Self-pay | Admitting: *Deleted

## 2022-02-02 DIAGNOSIS — R0789 Other chest pain: Secondary | ICD-10-CM | POA: Diagnosis not present

## 2022-02-02 DIAGNOSIS — J449 Chronic obstructive pulmonary disease, unspecified: Secondary | ICD-10-CM | POA: Insufficient documentation

## 2022-02-02 DIAGNOSIS — I509 Heart failure, unspecified: Secondary | ICD-10-CM | POA: Insufficient documentation

## 2022-02-02 DIAGNOSIS — R748 Abnormal levels of other serum enzymes: Secondary | ICD-10-CM | POA: Insufficient documentation

## 2022-02-02 DIAGNOSIS — E119 Type 2 diabetes mellitus without complications: Secondary | ICD-10-CM | POA: Insufficient documentation

## 2022-02-02 DIAGNOSIS — I700 Atherosclerosis of aorta: Secondary | ICD-10-CM | POA: Diagnosis not present

## 2022-02-02 DIAGNOSIS — R079 Chest pain, unspecified: Secondary | ICD-10-CM | POA: Diagnosis not present

## 2022-02-02 LAB — CBC WITH DIFFERENTIAL/PLATELET
Abs Immature Granulocytes: 0.01 10*3/uL (ref 0.00–0.07)
Basophils Absolute: 0 10*3/uL (ref 0.0–0.1)
Basophils Relative: 0 %
Eosinophils Absolute: 0.1 10*3/uL (ref 0.0–0.5)
Eosinophils Relative: 3 %
HCT: 39.5 % (ref 39.0–52.0)
Hemoglobin: 14.2 g/dL (ref 13.0–17.0)
Immature Granulocytes: 0 %
Lymphocytes Relative: 40 %
Lymphs Abs: 1.8 10*3/uL (ref 0.7–4.0)
MCH: 35 pg — ABNORMAL HIGH (ref 26.0–34.0)
MCHC: 35.9 g/dL (ref 30.0–36.0)
MCV: 97.3 fL (ref 80.0–100.0)
Monocytes Absolute: 0.4 10*3/uL (ref 0.1–1.0)
Monocytes Relative: 9 %
Neutro Abs: 2.1 10*3/uL (ref 1.7–7.7)
Neutrophils Relative %: 48 %
Platelets: 164 10*3/uL (ref 150–400)
RBC: 4.06 MIL/uL — ABNORMAL LOW (ref 4.22–5.81)
RDW: 13.2 % (ref 11.5–15.5)
WBC: 4.5 10*3/uL (ref 4.0–10.5)
nRBC: 0 % (ref 0.0–0.2)

## 2022-02-02 LAB — COMPREHENSIVE METABOLIC PANEL
ALT: 83 U/L — ABNORMAL HIGH (ref 0–44)
AST: 42 U/L — ABNORMAL HIGH (ref 15–41)
Albumin: 4.2 g/dL (ref 3.5–5.0)
Alkaline Phosphatase: 80 U/L (ref 38–126)
Anion gap: 6 (ref 5–15)
BUN: 15 mg/dL (ref 6–20)
CO2: 28 mmol/L (ref 22–32)
Calcium: 9.1 mg/dL (ref 8.9–10.3)
Chloride: 101 mmol/L (ref 98–111)
Creatinine, Ser: 0.62 mg/dL (ref 0.61–1.24)
GFR, Estimated: >60 mL/min (ref >60)
Glucose, Bld: 115 mg/dL — ABNORMAL HIGH (ref 70–99)
Potassium: 4.3 mmol/L (ref 3.5–5.1)
Sodium: 135 mmol/L (ref 135–145)
Total Bilirubin: 0.8 mg/dL (ref 0.3–1.2)
Total Protein: 8.3 g/dL — ABNORMAL HIGH (ref 6.5–8.1)

## 2022-02-02 LAB — LIPASE, BLOOD: Lipase: 38 U/L (ref 11–51)

## 2022-02-02 LAB — TROPONIN I (HIGH SENSITIVITY)
Troponin I (High Sensitivity): 9 ng/L (ref <18)
Troponin I (High Sensitivity): 10 ng/L (ref <18)

## 2022-02-02 NOTE — ED Provider Triage Note (Signed)
Emergency Medicine Provider Triage Evaluation Note  [employer] [name]. , a 56 y.o. male  was evaluated in triage.  Pt complains of left-sided chest pain, started earlier today, states playing cards felt 3 burst of pain, last for couple seconds and went away, states That he just was not feeling well, endorsing productive cough, denies active chest pain at this time no pleuritic chest pain no leg swelling, he has no other complaints..  Review of Systems  Positive: Chest pain, productive cough Negative: Short of breath, leg swelling  Physical Exam  BP 133/85   Pulse 66   Temp 98.1 F (36.7 C) (Oral)   Resp 16   Ht '5\' 7"'$  (1.702 m)   Wt 86.2 kg   SpO2 94%   BMI 29.76 kg/m  Gen:   Awake, no distress   Resp:  Normal effort  MSK:   Moves extremities without difficulty  Other:    Medical Decision Making  Medically screening exam initiated at 5:33 PM.  Appropriate orders placed.  [employer]. was informed that the remainder of the evaluation will be completed by another provider, this initial triage assessment does not replace that evaluation, and the importance of remaining in the ED until their evaluation is complete.  Lab work imaging been ordered will need further work-up.   Marcello Fennel, PA-C 02/02/22 1734

## 2022-02-02 NOTE — Discharge Instructions (Signed)
Lab work was unremarkable, possibly have a UTI, recommend ibuprofen Tylenol as needed for pain and fever control, Flonase and Zyrtec for decongestion, if is not improving please follow with your PCP.  Due to your cardiac history I would recommend follow-up with your cardiologist upon your release from prison.  Your liver enzymes were slightly elevated, likely this is transient and resolve on its own, but I would like you to have follow-up in a week's time for reassessment.  You may do this at your primary care provider.  Come back to the emergency department if you develop chest pain, shortness of breath, severe abdominal pain, uncontrolled nausea, vomiting, diarrhea.

## 2022-02-02 NOTE — ED Provider Notes (Signed)
[facility] EMERGENCY DEPARTMENT Provider Note   CSN: [id number] Arrival date & time: 02/02/22  1550     History  Chief Complaint  Patient presents with   Chest Pain    Randall Gip Yussef Jorge. is a 56 y.o. male.  HPI   Patient with medical history including COPD, diabetes, CHF, presents with complaint of chest pain, states on his left side, started today while he was playing cards, felt 3 sharp pains, came for few seconds and resolved, has not had it since, denies any shortness of breath, pleuritic chest pain, no lightheaded or dizziness, no peripheral edema, no history of PE or DVTs currently on hormone therapy.  He also notes that he feels some chest congestion, has had a slight cough, thinks he might have a URI, no other complaints at this time.  Review patient's chart was seen by cardiology in 2018, last catheterization was back in 2017 negative for evidence of CAD, most recent echocardiogram was last year, EF of 55 to 60% and evidence of grade 2 diastolic dysfunction.  Home Medications Prior to Admission medications   Medication Sig Start Date End Date Taking? Authorizing Provider  acetaminophen (TYLENOL) 500 MG tablet Take 1 tablet (500 mg total) by mouth every 6 (six) hours as needed for mild pain, moderate pain, fever or headache (or Fever >/= 101). 06/26/21   Oswald Hillock, MD  albuterol (PROVENTIL) (2.5 MG/3ML) 0.083% nebulizer solution Inhale 3 mLs (2.5 mg total) into the lungs every 6 (six) hours as needed for wheezing or shortness of breath. Patient not taking: Reported on 05/28/2021 04/14/21   Lilland, Alana, DO  amLODipine (NORVASC) 10 MG tablet Take 1 tablet (10 mg total) by mouth daily. 06/27/21   Oswald Hillock, MD  cefadroxil (DURICEF) 500 MG capsule Take 2 capsules (1,000 mg total) by mouth 2 (two) times daily. 06/26/21   Oswald Hillock, MD  doxycycline (VIBRA-TABS) 100 MG tablet Take 1 tablet (100 mg total) by mouth every 12 (twelve) hours. 06/26/21   Oswald Hillock, MD   folic acid (FOLVITE) 1 MG tablet Take 1 tablet (1 mg total) by mouth daily. Patient not taking: Reported on 04/14/2021 03/31/21   [name], DO  losartan (COZAAR) 25 MG tablet Take 1 tablet (25 mg total) by mouth daily. 06/27/21   Oswald Hillock, MD  Multiple Vitamin (MULTIVITAMIN WITH MINERALS) TABS tablet Take 1 tablet by mouth daily. Patient not taking: Reported on 04/14/2021 03/31/21   [name], DO  neomycin-bacitracin-polymyxin (NEOSPORIN) ointment Apply topically daily. Apply to finger daily with dressing change 06/26/21   Oswald Hillock, MD  thiamine 100 MG tablet Take 1 tablet (100 mg total) by mouth daily. Patient not taking: Reported on 04/14/2021 03/31/21   [name], DO  umeclidinium bromide (INCRUSE ELLIPTA) 62.5 MCG/ACT AEPB Inhale 1 puff into the lungs daily. Patient not taking: Reported on 05/28/2021 04/15/21   [name], DO      Allergies    Penicillins    Review of Systems   Review of Systems  Constitutional:  Negative for chills and fever.  Respiratory:  Negative for shortness of breath.   Cardiovascular:  Negative for chest pain.  Gastrointestinal:  Negative for abdominal pain.  Neurological:  Negative for headaches.    Physical Exam Updated Vital Signs BP (!) 140/93 (BP Location: Right Arm)   Pulse (!) 55   Temp 98 F (36.7 C) (Oral)   Resp (!) 22   Ht '5\' 7"'$  (1.702 m)  Wt 86.2 kg   SpO2 99%   BMI 29.76 kg/m  Physical Exam Vitals and nursing note reviewed.  Constitutional:      General: He is not in acute distress.    Appearance: He is not ill-appearing.  HENT:     Head: Normocephalic and atraumatic.     Nose: No congestion.  Eyes:     Conjunctiva/sclera: Conjunctivae normal.  Cardiovascular:     Rate and Rhythm: Normal rate and regular rhythm.     Pulses: Normal pulses.     Heart sounds: No murmur heard.    No friction rub. No gallop.  Pulmonary:     Effort: No respiratory distress.     Breath sounds: No wheezing, rhonchi or  rales.  Musculoskeletal:     Right lower leg: No edema.     Left lower leg: No edema.  Skin:    General: Skin is warm and dry.  Neurological:     Mental Status: He is alert.  Psychiatric:        Mood and Affect: Mood normal.     ED Results / Procedures / Treatments   Labs (all labs ordered are listed, but only abnormal results are displayed) Labs Reviewed  CBC WITH DIFFERENTIAL/PLATELET - Abnormal; Notable for the following components:      Result Value   RBC 4.06 (*)    MCH 35.0 (*)    All other components within normal limits  COMPREHENSIVE METABOLIC PANEL - Abnormal; Notable for the following components:   Glucose, Bld 115 (*)    Total Protein 8.3 (*)    AST 42 (*)    ALT 83 (*)    All other components within normal limits  LIPASE, BLOOD  TROPONIN I (HIGH SENSITIVITY)  TROPONIN I (HIGH SENSITIVITY)    EKG EKG Interpretation  Date/Time:  Friday February 02 2022 21:04:53 EDT Ventricular Rate:  46 PR Interval:  181 QRS Duration: 114 QT Interval:  483 QTC Calculation: 423 R Axis:   53 Text Interpretation: Sinus bradycardia Incomplete right bundle branch block Borderline ST depression, lateral leads No significant change since last tracing Confirmed by Dorie Rank [id number]) on 02/02/2022 9:39:15 PM  Radiology DG Chest 2 View  Result Date: 02/02/2022 CLINICAL DATA:  Chest pain. EXAM: CHEST - 2 VIEW COMPARISON:  Chest radiograph dated June 07, 2021. FINDINGS: The heart size and mediastinal contours are within normal limits. Atherosclerotic calcification of the aortic arch. Chronic interstitial markings, unchanged. No focal consolidation or pleural effusion. No acute osseous abnormality. IMPRESSION: Chronic interstitial lung markings, unchanged. No focal consolidation or pleural effusion. Electronically Signed   By: Keane Police D.O.   On: 02/02/2022 17:21    Procedures Procedures    Medications Ordered in ED Medications - No data to display  ED Course/ Medical  Decision Making/ A&P                           Medical Decision Making Amount and/or Complexity of Data Reviewed Labs: ordered. Radiology: ordered.   This patient presents to the ED for concern of chest pain, this involves an extensive number of treatment options, and is a complaint that carries with it a high risk of complications and morbidity.  The differential diagnosis includes ACS, PE, CHF, pneumonia    Additional history obtained:  Additional history obtained from N/A External records from outside source obtained and reviewed including previous cardiology notes   Co morbidities that complicate the patient  evaluation  CHF  Social Determinants of Health:  Currently in jail    Lab Tests:  I Ordered, and personally interpreted labs.  The pertinent results include: CBC unremarkable, CMP shows glucose of 115, AST 42 ALT 83, lipase 38 negative delta troponin   Imaging Studies ordered:  I ordered imaging studies including chest x-ray I independently visualized and interpreted imaging which showed negative acute findings I agree with the radiologist interpretation   Cardiac Monitoring:  The patient was maintained on a cardiac monitor.  I personally viewed and interpreted the cardiac monitored which showed an underlying rhythm of: EKG without signs of ischemia   Medicines ordered and prescription drug management:  I ordered medication including N/A I have reviewed the patients home medicines and have made adjustments as needed  Critical Interventions:  N/A   Reevaluation:  Chest pain, triage obtain basic lab work-up, will continue to monitor  Reassessed resting comfortably having no complaints he is agreement with plan discharge at this time.  Consultations Obtained:  N/A   Test Considered:  CTA of chest-deferred as my suspicion for PE is very low at this time, nontachypneic nonhypoxic, nontachycardic, no pleuritic chest pain, presentation is atypical  of etiology.    Rule out I have low suspicion for ACS as history is atypical, EKG was sinus rhythm without signs of ischemia, patient had negative delta troponin.   Low suspicion for AAA or aortic dissection as history is atypical, patient has low risk factors.  Low suspicion for CHF exacerbation as she is not volume overloaded my exam, there is no pleural effusion seen on chest x-ray.  I doubt cholecystitis as he has no right upper quadrant tenderness, abdomen soft nontender, slight elevation in liver enzymes but very mild, likely this is transient we will have him follow-up with his PCP for reassessment.  Low suspicion for systemic infection as patient is nontoxic-appearing, vital signs reassuring, no obvious source infection noted on exam.     Dispostion and problem list  After consideration of the diagnostic results and the patients response to treatment, I feel that the patent would benefit from discharge.  Atypical chest pain-possibly has underlying URI, will recommend symptom management, follow-up PCP as needed.  Due to his risk factors of CHF we will have him follow-up with cardiology for further evaluation. Elevated liver enzymes-suspect transient, will have him follow-up with PCP for repeat enzymes.            Final Clinical Impression(s) / ED Diagnoses Final diagnoses:  Atypical chest pain  Elevated liver enzymes    Rx / DC Orders ED Discharge Orders          Ordered    Ambulatory referral to Cardiology       Comments: If you have not heard from the Cardiology office within the next 72 hours please call [phone].   02/02/22 2143              Marcello Fennel, PA-C 02/02/22 2145    Dorie Rank, MD 02/03/22 [phone]

## 2022-02-02 NOTE — ED Triage Notes (Addendum)
BIB [facility] from [facility], c/o CP associated with sob, cramping in extremities, and dizziness. Occurred last week. Reoccurred today at 1230pm. Alert, NAD, calm, interactive. Given his usual lisinopril and amlodipine.

## 2022-02-05 ENCOUNTER — Telehealth: Payer: Self-pay

## 2022-02-05 NOTE — Telephone Encounter (Signed)
Transition Care Management Unsuccessful Follow-up Telephone Call  Date of discharge and from where:  02/02/2022 from [facility]  Attempts:  1st Attempt  Reason for unsuccessful TCM follow-up call:  Unable to reach patient  Number listed for patient was a correctional facility. I do not have updated contact information in the chart and no emergency contact information listed either.

## 2022-02-06 NOTE — Telephone Encounter (Signed)
Transition Care Management Unsuccessful Follow-up Telephone Call  Date of discharge and from where:  02/02/2022 from [facility]  Attempts:  2nd Attempt  Reason for unsuccessful TCM follow-up call:  Unable to reach patient

## 2022-02-08 NOTE — Telephone Encounter (Signed)
Transition Care Management Unsuccessful Follow-up Telephone Call  Date of discharge and from where:  02/02/2022 from [facility]  Attempts:  3rd Attempt  Reason for unsuccessful TCM follow-up call:  Unable to reach patient

## 2022-03-10 ENCOUNTER — Emergency Department (HOSPITAL_COMMUNITY): Payer: Medicaid Other

## 2022-03-10 ENCOUNTER — Emergency Department (HOSPITAL_COMMUNITY)
Admission: EM | Admit: 2022-03-10 | Discharge: 2022-03-11 | Disposition: A | Discharge status: Home / Self Care | Payer: Medicaid Other | Attending: Emergency Medicine | Admitting: Emergency Medicine

## 2022-03-10 DIAGNOSIS — I5031 Acute diastolic (congestive) heart failure: Secondary | ICD-10-CM | POA: Diagnosis not present

## 2022-03-10 DIAGNOSIS — R61 Generalized hyperhidrosis: Secondary | ICD-10-CM | POA: Diagnosis not present

## 2022-03-10 DIAGNOSIS — J449 Chronic obstructive pulmonary disease, unspecified: Secondary | ICD-10-CM | POA: Insufficient documentation

## 2022-03-10 DIAGNOSIS — R0602 Shortness of breath: Secondary | ICD-10-CM | POA: Diagnosis not present

## 2022-03-10 DIAGNOSIS — T401X1A Poisoning by heroin, accidental (unintentional), initial encounter: Secondary | ICD-10-CM | POA: Diagnosis present

## 2022-03-10 DIAGNOSIS — Z79899 Other long term (current) drug therapy: Secondary | ICD-10-CM | POA: Diagnosis not present

## 2022-03-10 DIAGNOSIS — I110 Hypertensive heart disease with heart failure: Secondary | ICD-10-CM | POA: Insufficient documentation

## 2022-03-10 DIAGNOSIS — T402X1A Poisoning by other opioids, accidental (unintentional), initial encounter: Secondary | ICD-10-CM | POA: Diagnosis not present

## 2022-03-10 DIAGNOSIS — R5383 Other fatigue: Secondary | ICD-10-CM | POA: Insufficient documentation

## 2022-03-10 DIAGNOSIS — Z7951 Long term (current) use of inhaled steroids: Secondary | ICD-10-CM | POA: Insufficient documentation

## 2022-03-10 DIAGNOSIS — R062 Wheezing: Secondary | ICD-10-CM | POA: Diagnosis not present

## 2022-03-10 DIAGNOSIS — Y900 Blood alcohol level of less than 20 mg/100 ml: Secondary | ICD-10-CM | POA: Insufficient documentation

## 2022-03-10 DIAGNOSIS — F1110 Opioid abuse, uncomplicated: Secondary | ICD-10-CM

## 2022-03-10 LAB — CBC WITH DIFFERENTIAL/PLATELET
Abs Immature Granulocytes: 0.01 10*3/uL (ref 0.00–0.07)
Basophils Absolute: 0 10*3/uL (ref 0.0–0.1)
Basophils Relative: 0 %
Eosinophils Absolute: 0.1 10*3/uL (ref 0.0–0.5)
Eosinophils Relative: 2 %
HCT: 38.9 % — ABNORMAL LOW (ref 39.0–52.0)
Hemoglobin: 13.3 g/dL (ref 13.0–17.0)
Immature Granulocytes: 0 %
Lymphocytes Relative: 23 %
Lymphs Abs: 1.2 10*3/uL (ref 0.7–4.0)
MCH: 36.8 pg — ABNORMAL HIGH (ref 26.0–34.0)
MCHC: 34.2 g/dL (ref 30.0–36.0)
MCV: 107.8 fL — ABNORMAL HIGH (ref 80.0–100.0)
Monocytes Absolute: 0.5 10*3/uL (ref 0.1–1.0)
Monocytes Relative: 10 %
Neutro Abs: 3.2 10*3/uL (ref 1.7–7.7)
Neutrophils Relative %: 65 %
Platelets: 177 10*3/uL (ref 150–400)
RBC: 3.61 MIL/uL — ABNORMAL LOW (ref 4.22–5.81)
RDW: 16 % — ABNORMAL HIGH (ref 11.5–15.5)
WBC: 4.9 10*3/uL (ref 4.0–10.5)
nRBC: 0 % (ref 0.0–0.2)

## 2022-03-10 LAB — I-STAT VENOUS BLOOD GAS, ED
Acid-Base Excess: 3 mmol/L — ABNORMAL HIGH (ref 0.0–2.0)
Bicarbonate: 28.5 mmol/L — ABNORMAL HIGH (ref 20.0–28.0)
Calcium, Ion: 1.13 mmol/L — ABNORMAL LOW (ref 1.15–1.40)
HCT: 39 % (ref 39.0–52.0)
Hemoglobin: 13.3 g/dL (ref 13.0–17.0)
O2 Saturation: 99 %
Potassium: 3.3 mmol/L — ABNORMAL LOW (ref 3.5–5.1)
Sodium: 139 mmol/L (ref 135–145)
TCO2: 30 mmol/L (ref 22–32)
pCO2, Ven: 45.2 mmHg (ref 44–60)
pH, Ven: 7.407 (ref 7.25–7.43)
pO2, Ven: 144 mmHg — ABNORMAL HIGH (ref 32–45)

## 2022-03-10 LAB — COMPREHENSIVE METABOLIC PANEL
ALT: 28 U/L (ref 0–44)
AST: 36 U/L (ref 15–41)
Albumin: 3.4 g/dL — ABNORMAL LOW (ref 3.5–5.0)
Alkaline Phosphatase: 75 U/L (ref 38–126)
Anion gap: 6 (ref 5–15)
BUN: 16 mg/dL (ref 6–20)
CO2: 26 mmol/L (ref 22–32)
Calcium: 8.8 mg/dL — ABNORMAL LOW (ref 8.9–10.3)
Chloride: 105 mmol/L (ref 98–111)
Creatinine, Ser: 0.79 mg/dL (ref 0.61–1.24)
GFR, Estimated: >60 mL/min (ref >60)
Glucose, Bld: 106 mg/dL — ABNORMAL HIGH (ref 70–99)
Potassium: 3.4 mmol/L — ABNORMAL LOW (ref 3.5–5.1)
Sodium: 137 mmol/L (ref 135–145)
Total Bilirubin: 0.9 mg/dL (ref 0.3–1.2)
Total Protein: 7 g/dL (ref 6.5–8.1)

## 2022-03-10 LAB — TROPONIN I (HIGH SENSITIVITY): Troponin I (High Sensitivity): 19 ng/L — ABNORMAL HIGH (ref <18)

## 2022-03-10 LAB — ETHANOL: Alcohol, Ethyl (B): <10 mg/dL (ref <10)

## 2022-03-10 LAB — BRAIN NATRIURETIC PEPTIDE: B Natriuretic Peptide: 217.2 pg/mL — ABNORMAL HIGH (ref 0.0–100.0)

## 2022-03-10 LAB — MAGNESIUM: Magnesium: 2 mg/dL (ref 1.7–2.4)

## 2022-03-10 MED ORDER — NALOXONE HCL 0.4 MG/ML IJ SOLN: 0.4000 mg | Freq: Once | INTRAMUSCULAR | Status: AC

## 2022-03-10 MED ORDER — IPRATROPIUM-ALBUTEROL 0.5-2.5 (3) MG/3ML IN SOLN
3.0000 mL | RESPIRATORY_TRACT | Status: AC
  Administered 2022-03-10 – 2022-03-11 (×3): 3 mL via RESPIRATORY_TRACT
  Filled 2022-03-10: qty 6

## 2022-03-10 MED ORDER — METHYLPREDNISOLONE SODIUM SUCC 125 MG IJ SOLR
125.0000 mg | Freq: Once | INTRAMUSCULAR | Status: AC
  Administered 2022-03-10: 125 mg via INTRAVENOUS
  Filled 2022-03-10: qty 2

## 2022-03-10 MED ORDER — IPRATROPIUM-ALBUTEROL 0.5-2.5 (3) MG/3ML IN SOLN: 3.0000 mL | Freq: Once | RESPIRATORY_TRACT | Status: DC

## 2022-03-10 MED ORDER — NALOXONE HCL 0.4 MG/ML IJ SOLN
INTRAMUSCULAR | Status: AC
  Administered 2022-03-10: 0.4 mg via INTRAVENOUS
  Filled 2022-03-10: qty 1

## 2022-03-10 NOTE — ED Triage Notes (Signed)
Pt arrived via [facility] fir cc if shortness of breath. Upon arrival to [facility], pt was tachypnic, smoking a cigarette and reporting he felt like he was going to pass out. 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 Narcan was effective, pt began coughing, gagging, and reporting nausea. PMH COPD, CHF - nonmed compliant.   BP 157/90 HR 81 SPO2 99%  RR 28 CBG 195

## 2022-03-10 NOTE — ED Provider Notes (Signed)
[facility] EMERGENCY DEPARTMENT Provider Note   CSN: [id number] Arrival date & time: 03/10/22  2019     History {Add pertinent medical, surgical, social history, OB history to HPI:1} No chief complaint on file.   Randall Charles Dayton Kenley. is a 56 y.o. male.  HPI     Home Medications Prior to Admission medications   Medication Sig Start Date End Date Taking? Authorizing Provider  acetaminophen (TYLENOL) 500 MG tablet Take 1 tablet (500 mg total) by mouth every 6 (six) hours as needed for mild pain, moderate pain, fever or headache (or Fever >/= 101). 06/26/21   Oswald Hillock, MD  albuterol (PROVENTIL) (2.5 MG/3ML) 0.083% nebulizer solution Inhale 3 mLs (2.5 mg total) into the lungs every 6 (six) hours as needed for wheezing or shortness of breath. Patient not taking: Reported on 05/28/2021 04/14/21   Lilland, Alana, DO  amLODipine (NORVASC) 10 MG tablet Take 1 tablet (10 mg total) by mouth daily. 06/27/21   Oswald Hillock, MD  cefadroxil (DURICEF) 500 MG capsule Take 2 capsules (1,000 mg total) by mouth 2 (two) times daily. 06/26/21   Oswald Hillock, MD  doxycycline (VIBRA-TABS) 100 MG tablet Take 1 tablet (100 mg total) by mouth every 12 (twelve) hours. 06/26/21   Oswald Hillock, MD  folic acid (FOLVITE) 1 MG tablet Take 1 tablet (1 mg total) by mouth daily. Patient not taking: Reported on 04/14/2021 03/31/21   [name], DO  losartan (COZAAR) 25 MG tablet Take 1 tablet (25 mg total) by mouth daily. 06/27/21   Oswald Hillock, MD  Multiple Vitamin (MULTIVITAMIN WITH MINERALS) TABS tablet Take 1 tablet by mouth daily. Patient not taking: Reported on 04/14/2021 03/31/21   [name], DO  neomycin-bacitracin-polymyxin (NEOSPORIN) ointment Apply topically daily. Apply to finger daily with dressing change 06/26/21   Oswald Hillock, MD  thiamine 100 MG tablet Take 1 tablet (100 mg total) by mouth daily. Patient not taking: Reported on 04/14/2021 03/31/21   [name], DO   umeclidinium bromide (INCRUSE ELLIPTA) 62.5 MCG/ACT AEPB Inhale 1 puff into the lungs daily. Patient not taking: Reported on 05/28/2021 04/15/21   [name], DO      Allergies    Penicillins    Review of Systems   Review of Systems  Physical Exam Updated Vital Signs There were no vitals taken for this visit. Physical Exam  ED Results / Procedures / Treatments   Labs (all labs ordered are listed, but only abnormal results are displayed) Labs Reviewed - No data to display  EKG None  Radiology No results found.  Procedures Procedures  {Document cardiac monitor, telemetry assessment procedure when appropriate:1}  Medications Ordered in ED Medications - No data to display  ED Course/ Medical Decision Making/ A&P                           Medical Decision Making  ***  {Document critical care time when appropriate:1} {Document review of labs and clinical decision tools ie heart score, Chads2Vasc2 etc:1}  {Document your independent review of radiology images, and any outside records:1} {Document your discussion with family members, caretakers, and with consultants:1} {Document social determinants of health affecting pt's care:1} {Document your decision making why or why not admission, treatments were needed:1} Final Clinical Impression(s) / ED Diagnoses Final diagnoses:  None    Rx / DC Orders ED Discharge Orders     None

## 2022-03-11 LAB — TROPONIN I (HIGH SENSITIVITY): Troponin I (High Sensitivity): 17 ng/L (ref <18)

## 2022-03-11 MED ORDER — NALOXONE HCL 4 MG/0.1ML NA LIQD: 1.0000 | Freq: Once | NASAL | 0 refills | Status: AC

## 2022-03-11 NOTE — Discharge Instructions (Addendum)
Return to the emergency department if you develop any new or worsening symptoms, difficulty breathing, shortness of breath, chest pain, loss consciousness or any other concerning symptoms.  This was a Narcan prescription and carry it with you at all times.  RESOURCE GUIDE  Chronic Pain Problems: Contact [facility]  [phone] Patients need to be referred by their primary care doctor.  Insufficient Money for Medicine: Contact United Way:  call "211."   No Primary Care Doctor: Call Health Connect  [phone] - can help you locate a primary care doctor that  accepts your insurance, provides certain services, etc. Physician Referral Service- [phone]  Agencies that provide inexpensive medical care: [facility]  [facility]  [phone] [facility]  [phone] [facility]  [phone] Planned [facility]  [phone] [facility]  [phone]  [place] Providers: [facility]- [address], [address]  [phone], Mon-Fri 9am-7pm, Sat 9am-1pm [place], [address] [place]  [place], [address] [place]  [place]- [address]  [place], [address], [phone]  Only accepts [place] [facility] [place] patients after they have their name  applied to their card  Self Pay (no insurance) in [facility]: Sickle Cell Patients: Dr Kevan Ny, [facility]  [place], [facility]- [place]  [facility] [place]- [address], [facility]- see information above (Speak to [employer] if you do not have insurance)       -  [facility]- [place],  [place], [place]  Dr Vista Lawman-  [address], [place], [place], [place]       -  Urgent Medical and [facility] - [address] [phone]       -  [facility]- [address], [place], also [address] [phone]       -    [facility]- [place], [place], 1st & 3rd Saturday        every month, 10am-1pm  [facility] [place], [place] [address] [phone]  [facility] [place]. [place], [place] [address] [phone]  1) Find a Doctor and Pay Out of Pocket Although you won't have to find out who is covered by your insurance plan, it is a good idea to ask around and get recommendations. You will then need to call the office and see if the doctor you have chosen will accept you as a new patient and what types of options they offer for patients who are self-pay. Some doctors offer discounts or will set up payment plans for their patients who do not have insurance, but you will need to ask so you aren't surprised when you get to your appointment.  2) Contact Your Local Health Department Not all health departments have doctors that can see patients for sick visits, but many do, so it is worth a call to  see if yours does. If you don't know where your local health department is, you can check in your phone book. The CDC also has a tool to help you locate your state's health department, and many state websites also have listings of all of their local health departments.  3) Find a [facility] If your illness is not likely to be very severe or complicated, you may want to try a walk in clinic. These are popping up all over the country in pharmacies, drugstores, and shopping centers. They're usually staffed by nurse practitioners or physician assistants that have been trained to treat common illnesses and complaints. They're usually fairly quick and inexpensive.  However, if you have serious medical issues or chronic medical problems, these are probably not your best option  [facility] [place], [facility], [address] [place], phone [phone] or [phone].  Monday - Friday, call for an appointment. [place], [facility], [address], [place], phone [phone] or [phone].  Monday - Friday, call for an appointment.  Abuse/Neglect: [place] [phone] [place] [phone] (After Hours)  Emergency Shelter:  [facility] [phone]  Maternity Homes: [facility] [place] [phone] [place] [phone]  MRSA Hotline #:   516-652-1941  Dental Assistance If unable to pay or uninsured, contact:  [facility]. to become qualified for the adult dental clinic.  Patients with Medicaid: [facility] [phone] [address], [place] [address], [phone]  If unable to pay, or uninsured, contact [facility] [phone] in [place], [place] in [facility]) to become qualified for the adult dental clinic  [facility] [address] [place], [place] [address] [phone] [email or web address]  Other [place]: [facility] [place], [place], [place], [address], [place], [address], 2nd and 4th Thursday of the month at 6:30am.  10 clients each day by appointment, can sometimes see walk-in patients if someone does not show for an appointment. [facility]- [address] [place]ard Danker [place], [place], [address], [place], [place], [place], [address], [place] Department- [phone] [place] [facility] [phone]

## 2022-05-11 ENCOUNTER — Emergency Department (HOSPITAL_COMMUNITY): Payer: Medicaid Other

## 2022-05-11 ENCOUNTER — Encounter (HOSPITAL_COMMUNITY): Payer: Self-pay

## 2022-05-11 ENCOUNTER — Emergency Department (HOSPITAL_COMMUNITY)
Admission: EM | Admit: 2022-05-11 | Discharge: 2022-05-11 | Disposition: A | Discharge status: Home / Self Care | Payer: Medicaid Other | Attending: Emergency Medicine | Admitting: Emergency Medicine

## 2022-05-11 ENCOUNTER — Other Ambulatory Visit: Payer: Self-pay

## 2022-05-11 DIAGNOSIS — Z79899 Other long term (current) drug therapy: Secondary | ICD-10-CM | POA: Insufficient documentation

## 2022-05-11 DIAGNOSIS — F17210 Nicotine dependence, cigarettes, uncomplicated: Secondary | ICD-10-CM | POA: Insufficient documentation

## 2022-05-11 DIAGNOSIS — I509 Heart failure, unspecified: Secondary | ICD-10-CM | POA: Diagnosis not present

## 2022-05-11 DIAGNOSIS — Z7951 Long term (current) use of inhaled steroids: Secondary | ICD-10-CM | POA: Insufficient documentation

## 2022-05-11 DIAGNOSIS — R778 Other specified abnormalities of plasma proteins: Secondary | ICD-10-CM | POA: Insufficient documentation

## 2022-05-11 DIAGNOSIS — J449 Chronic obstructive pulmonary disease, unspecified: Secondary | ICD-10-CM | POA: Diagnosis not present

## 2022-05-11 DIAGNOSIS — Z1152 Encounter for screening for COVID-19: Secondary | ICD-10-CM | POA: Insufficient documentation

## 2022-05-11 DIAGNOSIS — F1910 Other psychoactive substance abuse, uncomplicated: Secondary | ICD-10-CM | POA: Insufficient documentation

## 2022-05-11 DIAGNOSIS — R42 Dizziness and giddiness: Secondary | ICD-10-CM | POA: Diagnosis not present

## 2022-05-11 DIAGNOSIS — R0602 Shortness of breath: Secondary | ICD-10-CM | POA: Insufficient documentation

## 2022-05-11 DIAGNOSIS — R079 Chest pain, unspecified: Secondary | ICD-10-CM | POA: Diagnosis not present

## 2022-05-11 DIAGNOSIS — E119 Type 2 diabetes mellitus without complications: Secondary | ICD-10-CM | POA: Insufficient documentation

## 2022-05-11 DIAGNOSIS — R059 Cough, unspecified: Secondary | ICD-10-CM | POA: Insufficient documentation

## 2022-05-11 DIAGNOSIS — R062 Wheezing: Secondary | ICD-10-CM | POA: Insufficient documentation

## 2022-05-11 DIAGNOSIS — R457 State of emotional shock and stress, unspecified: Secondary | ICD-10-CM | POA: Diagnosis not present

## 2022-05-11 LAB — URINALYSIS, ROUTINE W REFLEX MICROSCOPIC
Bacteria, UA: NONE SEEN
Bilirubin Urine: NEGATIVE
Glucose, UA: NEGATIVE mg/dL
Ketones, ur: NEGATIVE mg/dL
Leukocytes,Ua: NEGATIVE
Nitrite: NEGATIVE
Protein, ur: NEGATIVE mg/dL
Specific Gravity, Urine: 1.008 (ref 1.005–1.030)
pH: 5 (ref 5.0–8.0)

## 2022-05-11 LAB — RESP PANEL BY RT-PCR (RSV, FLU A&B, COVID)  RVPGX2
Influenza A by PCR: NEGATIVE
Influenza B by PCR: NEGATIVE
Resp Syncytial Virus by PCR: NEGATIVE
SARS Coronavirus 2 by RT PCR: NEGATIVE

## 2022-05-11 LAB — CBC WITH DIFFERENTIAL/PLATELET
Abs Immature Granulocytes: 0.04 10*3/uL (ref 0.00–0.07)
Basophils Absolute: 0 10*3/uL (ref 0.0–0.1)
Basophils Relative: 0 %
Eosinophils Absolute: 0.1 10*3/uL (ref 0.0–0.5)
Eosinophils Relative: 2 %
HCT: 45 % (ref 39.0–52.0)
Hemoglobin: 16.2 g/dL (ref 13.0–17.0)
Immature Granulocytes: 1 %
Lymphocytes Relative: 33 %
Lymphs Abs: 2.6 10*3/uL (ref 0.7–4.0)
MCH: 37.2 pg — ABNORMAL HIGH (ref 26.0–34.0)
MCHC: 36 g/dL (ref 30.0–36.0)
MCV: 103.2 fL — ABNORMAL HIGH (ref 80.0–100.0)
Monocytes Absolute: 0.6 10*3/uL (ref 0.1–1.0)
Monocytes Relative: 8 %
Neutro Abs: 4.3 10*3/uL (ref 1.7–7.7)
Neutrophils Relative %: 56 %
Platelets: 204 10*3/uL (ref 150–400)
RBC: 4.36 MIL/uL (ref 4.22–5.81)
RDW: 12.9 % (ref 11.5–15.5)
WBC: 7.7 10*3/uL (ref 4.0–10.5)
nRBC: 0 % (ref 0.0–0.2)

## 2022-05-11 LAB — COMPREHENSIVE METABOLIC PANEL
ALT: 32 U/L (ref 0–44)
AST: 35 U/L (ref 15–41)
Albumin: 3.8 g/dL (ref 3.5–5.0)
Alkaline Phosphatase: 65 U/L (ref 38–126)
Anion gap: 7 (ref 5–15)
BUN: 13 mg/dL (ref 6–20)
CO2: 27 mmol/L (ref 22–32)
Calcium: 8.7 mg/dL — ABNORMAL LOW (ref 8.9–10.3)
Chloride: 105 mmol/L (ref 98–111)
Creatinine, Ser: 0.91 mg/dL (ref 0.61–1.24)
GFR, Estimated: >60 mL/min (ref >60)
Glucose, Bld: 110 mg/dL — ABNORMAL HIGH (ref 70–99)
Potassium: 3.8 mmol/L (ref 3.5–5.1)
Sodium: 139 mmol/L (ref 135–145)
Total Bilirubin: 1.2 mg/dL (ref 0.3–1.2)
Total Protein: 7.4 g/dL (ref 6.5–8.1)

## 2022-05-11 LAB — TROPONIN I (HIGH SENSITIVITY)
Troponin I (High Sensitivity): 25 ng/L — ABNORMAL HIGH (ref <18)
Troponin I (High Sensitivity): 22 ng/L — ABNORMAL HIGH (ref <18)

## 2022-05-11 MED ORDER — LOSARTAN POTASSIUM 25 MG PO TABS: 25.0000 mg | ORAL_TABLET | Freq: Every day | ORAL | 3 refills | Status: AC

## 2022-05-11 MED ORDER — ALBUTEROL SULFATE (2.5 MG/3ML) 0.083% IN NEBU
5.0000 mg | INHALATION_SOLUTION | Freq: Once | RESPIRATORY_TRACT | Status: AC
  Administered 2022-05-11: 5 mg via RESPIRATORY_TRACT
  Filled 2022-05-11: qty 6

## 2022-05-11 MED ORDER — AMLODIPINE BESYLATE 10 MG PO TABS: 10.0000 mg | ORAL_TABLET | Freq: Every day | ORAL | 3 refills | Status: AC

## 2022-05-11 MED ORDER — ALBUTEROL SULFATE HFA 108 (90 BASE) MCG/ACT IN AERS
2.0000 | INHALATION_SPRAY | RESPIRATORY_TRACT | Status: DC | PRN
  Filled 2022-05-11: qty 6.7

## 2022-05-11 NOTE — ED Notes (Signed)
Pt return back to lobby

## 2022-05-11 NOTE — ED Notes (Signed)
I do not see pt in lobby anymore

## 2022-05-11 NOTE — Discharge Instructions (Addendum)
Make an appointment to follow-up with your primary care doctor or cardiologist as soon as possible.  Turn to emergency room if you have any worsening symptoms.

## 2022-05-11 NOTE — ED Triage Notes (Addendum)
Pt to Ed via [facility].  Pt c/o dizziness, left flank pain, productive cough, and fatigue.    Pt states he started having a productive cough and dizziness yesterday. Pt denies fevers.    142/90 HR=80s 98%RA RR=20 Fsbs=127

## 2022-05-11 NOTE — ED Provider Triage Note (Signed)
  Emergency Medicine Provider Triage Evaluation Note  MRN:  [id number]  Arrival date & time: 05/11/22    Medically screening exam initiated at 2:40 AM.   CC:   Dizziness   HPI:  [name]. is a 56 y.o. year-old male presents to the ED with chief complaint of cough, SOB, dizziness.  Onset tonight.  Denies fever.  Has had productive cough.  History provided by patient. ROS:  -As included in HPI PE:   Vitals:   05/11/22 0238  BP: (!) 140/93  Pulse: 65  Resp: 16  Temp: 97.8 F (36.6 C)  SpO2: 100%    Non-toxic appearing No respiratory distress  MDM:  Based on signs and symptoms, flu is highest on my differential. I've ordered labs and imaging in triage to expedite lab/diagnostic workup.  Patient was informed that the remainder of the evaluation will be completed by another provider, this initial triage assessment does not replace that evaluation, and the importance of remaining in the ED until their evaluation is complete.    Montine Circle, PA-C 05/11/22 [phone]
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
## 2022-06-28 ENCOUNTER — Emergency Department (HOSPITAL_COMMUNITY): Payer: Medicaid Other

## 2022-06-28 ENCOUNTER — Encounter (HOSPITAL_COMMUNITY): Payer: Self-pay

## 2022-06-28 ENCOUNTER — Other Ambulatory Visit: Payer: Self-pay

## 2022-06-28 ENCOUNTER — Emergency Department (HOSPITAL_COMMUNITY)
Admission: EM | Admit: 2022-06-28 | Discharge: 2022-06-28 | Disposition: A | Discharge status: Home / Self Care | Payer: Medicaid Other | Attending: Emergency Medicine | Admitting: Emergency Medicine

## 2022-06-28 DIAGNOSIS — S62646A Nondisplaced fracture of proximal phalanx of right little finger, initial encounter for closed fracture: Secondary | ICD-10-CM | POA: Insufficient documentation

## 2022-06-28 DIAGNOSIS — S62112A Displaced fracture of triquetrum [cuneiform] bone, left wrist, initial encounter for closed fracture: Secondary | ICD-10-CM | POA: Insufficient documentation

## 2022-06-28 DIAGNOSIS — Z5900 Homelessness unspecified: Secondary | ICD-10-CM | POA: Diagnosis not present

## 2022-06-28 DIAGNOSIS — S62102A Fracture of unspecified carpal bone, left wrist, initial encounter for closed fracture: Secondary | ICD-10-CM

## 2022-06-28 DIAGNOSIS — W19XXXA Unspecified fall, initial encounter: Secondary | ICD-10-CM

## 2022-06-28 DIAGNOSIS — S6991XA Unspecified injury of right wrist, hand and finger(s), initial encounter: Secondary | ICD-10-CM | POA: Diagnosis present

## 2022-06-28 LAB — CBC WITH DIFFERENTIAL/PLATELET
Abs Immature Granulocytes: 0.03 10*3/uL (ref 0.00–0.07)
Basophils Absolute: 0 10*3/uL (ref 0.0–0.1)
Basophils Relative: 0 %
Eosinophils Absolute: 0 10*3/uL (ref 0.0–0.5)
Eosinophils Relative: 0 %
HCT: 46.9 % (ref 39.0–52.0)
Hemoglobin: 17 g/dL (ref 13.0–17.0)
Immature Granulocytes: 0 %
Lymphocytes Relative: 20 %
Lymphs Abs: 1.5 10*3/uL (ref 0.7–4.0)
MCH: 37 pg — ABNORMAL HIGH (ref 26.0–34.0)
MCHC: 36.2 g/dL — ABNORMAL HIGH (ref 30.0–36.0)
MCV: 102 fL — ABNORMAL HIGH (ref 80.0–100.0)
Monocytes Absolute: 0.7 10*3/uL (ref 0.1–1.0)
Monocytes Relative: 9 %
Neutro Abs: 5.5 10*3/uL (ref 1.7–7.7)
Neutrophils Relative %: 71 %
Platelets: 219 10*3/uL (ref 150–400)
RBC: 4.6 MIL/uL (ref 4.22–5.81)
RDW: 13.2 % (ref 11.5–15.5)
WBC: 7.7 10*3/uL (ref 4.0–10.5)
nRBC: 0 % (ref 0.0–0.2)

## 2022-06-28 LAB — TYPE AND SCREEN
ABO/RH(D): A POS
Antibody Screen: NEGATIVE

## 2022-06-28 LAB — COMPREHENSIVE METABOLIC PANEL
ALT: 55 U/L — ABNORMAL HIGH (ref 0–44)
AST: 40 U/L (ref 15–41)
Albumin: 4.1 g/dL (ref 3.5–5.0)
Alkaline Phosphatase: 75 U/L (ref 38–126)
Anion gap: 11 (ref 5–15)
BUN: 9 mg/dL (ref 6–20)
CO2: 28 mmol/L (ref 22–32)
Calcium: 9 mg/dL (ref 8.9–10.3)
Chloride: 96 mmol/L — ABNORMAL LOW (ref 98–111)
Creatinine, Ser: 0.75 mg/dL (ref 0.61–1.24)
GFR, Estimated: >60 mL/min (ref >60)
Glucose, Bld: 105 mg/dL — ABNORMAL HIGH (ref 70–99)
Potassium: 3.5 mmol/L (ref 3.5–5.1)
Sodium: 135 mmol/L (ref 135–145)
Total Bilirubin: 2.1 mg/dL — ABNORMAL HIGH (ref 0.3–1.2)
Total Protein: 7.6 g/dL (ref 6.5–8.1)

## 2022-06-28 LAB — I-STAT CHEM 8, ED
BUN: 10 mg/dL (ref 6–20)
Calcium, Ion: 1.13 mmol/L — ABNORMAL LOW (ref 1.15–1.40)
Chloride: 97 mmol/L — ABNORMAL LOW (ref 98–111)
Creatinine, Ser: 0.6 mg/dL — ABNORMAL LOW (ref 0.61–1.24)
Glucose, Bld: 108 mg/dL — ABNORMAL HIGH (ref 70–99)
HCT: 50 % (ref 39.0–52.0)
Hemoglobin: 17 g/dL (ref 13.0–17.0)
Potassium: 3.6 mmol/L (ref 3.5–5.1)
Sodium: 137 mmol/L (ref 135–145)
TCO2: 26 mmol/L (ref 22–32)

## 2022-06-28 LAB — RAPID URINE DRUG SCREEN, HOSP PERFORMED
Amphetamines: NOT DETECTED
Barbiturates: NOT DETECTED
Benzodiazepines: NOT DETECTED
Cocaine: POSITIVE — AB
Opiates: NOT DETECTED
Tetrahydrocannabinol: NOT DETECTED

## 2022-06-28 LAB — ETHANOL: Alcohol, Ethyl (B): <10 mg/dL (ref <10)

## 2022-06-28 MED ORDER — LORAZEPAM 2 MG/ML IJ SOLN
0.5000 mg | Freq: Once | INTRAMUSCULAR | Status: AC
  Administered 2022-06-28: 0.5 mg via INTRAVENOUS
  Filled 2022-06-28: qty 1

## 2022-06-28 NOTE — ED Notes (Signed)
Patient unable to provide urine sample

## 2022-06-28 NOTE — Discharge Instructions (Addendum)
Return for any problem.   Follow up with Hand specialist as discussed.

## 2022-06-28 NOTE — ED Notes (Signed)
Patient taken to x-ray with tech

## 2022-06-28 NOTE — ED Notes (Signed)
Unable to provide urine sample at this time

## 2022-06-28 NOTE — Progress Notes (Signed)
Orthopedic Tech Progress Note Patient Details:  [name]. 1965-07-31 [id number]  Finger splint applied to R 5th finger and velcro wrist brace applied to L wrist. Confirmed with pt both the splint and brace were not too tight.   Ortho Devices Type of Ortho Device: Finger splint, Velcro wrist splint Ortho Device/Splint Location: BUE (R 5th finger, L wrist) Ortho Device/Splint Interventions: Ordered, Application, Adjustment   Post Interventions Patient Tolerated: Fair Instructions Provided: Care of device, Adjustment of device  Torence Palmeri Jeri Modena 06/28/2022, 2:19 PM

## 2022-06-28 NOTE — ED Notes (Signed)
Patient refused to take discharge papers
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
## 2022-06-28 NOTE — ED Notes (Signed)
Ortho tech at bedside for slpint application

## 2022-06-28 NOTE — ED Notes (Signed)
Ortho called for splints

## 2022-06-28 NOTE — ED Notes (Signed)
ED Provider at bedside.
